# Patient Record
Sex: Female | Born: 1950
Health system: Southern US, Community
[De-identification: ages and names within clinical notes are randomized; demographics above are authoritative.]

## PROBLEM LIST (undated history)

## (undated) DIAGNOSIS — F429 Obsessive-compulsive disorder, unspecified: Secondary | ICD-10-CM

## (undated) DIAGNOSIS — R0602 Shortness of breath: Secondary | ICD-10-CM

## (undated) DIAGNOSIS — B379 Candidiasis, unspecified: Principal | ICD-10-CM

## (undated) DIAGNOSIS — J45909 Unspecified asthma, uncomplicated: Secondary | ICD-10-CM

## (undated) DIAGNOSIS — F419 Anxiety disorder, unspecified: Secondary | ICD-10-CM

## (undated) DIAGNOSIS — F329 Major depressive disorder, single episode, unspecified: Secondary | ICD-10-CM

## (undated) DIAGNOSIS — R05 Cough: Secondary | ICD-10-CM

## (undated) DIAGNOSIS — K219 Gastro-esophageal reflux disease without esophagitis: Secondary | ICD-10-CM

## (undated) DIAGNOSIS — R059 Cough, unspecified: Secondary | ICD-10-CM

## (undated) DIAGNOSIS — I509 Heart failure, unspecified: Secondary | ICD-10-CM

## (undated) DIAGNOSIS — F32A Depression, unspecified: Secondary | ICD-10-CM

## (undated) HISTORY — PX: TUBAL LIGATION: SHX77

## (undated) HISTORY — DX: Cough, unspecified: R05.9

## (undated) HISTORY — DX: Candidiasis, unspecified: B37.9

## (undated) HISTORY — DX: Cough: R05

## (undated) HISTORY — PX: TONSILLECTOMY: SUR1361

## (undated) MED FILL — Azacitidine For Inj 100 MG: INTRAMUSCULAR | Qty: 12.6 | Status: AC

---

## 2000-09-05 ENCOUNTER — Inpatient Hospital Stay (HOSPITAL_COMMUNITY): Admission: EM | Admit: 2000-09-05 | Discharge: 2000-09-09 | Payer: Self-pay | Admitting: *Deleted

## 2003-12-03 ENCOUNTER — Ambulatory Visit (HOSPITAL_COMMUNITY): Admission: RE | Admit: 2003-12-03 | Discharge: 2003-12-03 | Payer: Self-pay | Admitting: Internal Medicine

## 2006-04-16 ENCOUNTER — Emergency Department (HOSPITAL_COMMUNITY): Admission: EM | Admit: 2006-04-16 | Discharge: 2006-04-16 | Payer: Self-pay | Admitting: Emergency Medicine

## 2006-07-10 ENCOUNTER — Emergency Department (HOSPITAL_COMMUNITY): Admission: EM | Admit: 2006-07-10 | Discharge: 2006-07-10 | Payer: Self-pay | Admitting: Emergency Medicine

## 2008-12-10 ENCOUNTER — Ambulatory Visit (HOSPITAL_COMMUNITY): Admission: RE | Admit: 2008-12-10 | Discharge: 2008-12-10 | Payer: Self-pay | Admitting: Internal Medicine

## 2009-08-11 ENCOUNTER — Ambulatory Visit (HOSPITAL_COMMUNITY): Admission: RE | Admit: 2009-08-11 | Discharge: 2009-08-11 | Payer: Self-pay | Admitting: Internal Medicine

## 2010-12-04 NOTE — H&P (Signed)
[facility]  Patient:    [name], [name]                         MRN: [id number] Adm. Date:  [id number] Attending:  Denny Peon Dictator:   Johnella Moloney, N.P.                         History and Physical  IDENTIFYING INFORMATION:  [name] is a 60 year old white married female admitted on an involuntary basis September 05, 2000 with a chief complaint of "Because I cannot control or talk about my obsessive-compulsive thoughts."  HISTORY OF PRESENT ILLNESS:  The patient saw Dr. Dub Mikes on September 05, 2000. Apparently after that she went home and several hours later did not feel calm and she got a knife and cut her left wrist and left posterior chest with a knife, superficial scratches.  She states she just wanted to hurt herself so "They would realize I dont feel good."  By "they" she means her mother, husband, family members, and other people.  She denies this was a suicide attempt.  She reports that her family does not want to talk about her illness. She has been depressed for one month.  Sleep has been so-so but has decreased, appetite so-so but there has been no weight loss.  Currently denies suicidal ideation, denies hallucinations, no paranoia.  She does have decreased concentration, decreased energy, anhedonia, feels very guilty and cries a lot when this is mentioned and then she says, "I just cant get it together."  She has experienced more anxiety lately.  She does have obsessive-compulsive disorder; it has been worse and more intense recently.  The obsessive-compulsive thoughts are of a sexual nature about her aunt and mother.  She is also having other obsessive thoughts of a sexual nature and she states that these thoughts and with her through most of the day.  Again, they have gotten worse over the last two weeks.  She has had obsessive thoughts over the past five years but they have been under control with medications in the past.  Other  stressors include the fact that her father has Alzheimers and her mother is the caregiver.  Also, she has not been able to work because she cannot concentrate and she has been on a leave of absence since October 2001.  Apparently she recently in the past week told her mother and aunt about these obsessions and she wants to talk about it and, of course, they do not.  Compulsions include washing her hands maybe 10 times a day, fearful of germs, checking things, and at times grabbing paper to clean herself related to her sexual obsessions.  PAST PSYCHIATRIC HISTORY:  The patient denies previous suicide attempt.  She has been seeing Dr. Dub Mikes at [facility] for about five years for treatment of her OCD and depression.  She has had one inpatient hospitalization three years ago in [place] for the OCD.  She has also seen a private psychiatrist in [place] for years many years ago.  Apparently the patient has been on Luvox, Anafranil, Prozac, Zoloft, Celexa, and Wellbutrin and Sonata.  Reports there is a little yellow pill that she cannot take but she does not know the name of it.  She feels like the Paxil has been the most beneficial.  PAST MEDICAL HISTORY:  Primary care doctor is Dr. Carylon Perches, whom she last saw last  year.  Medical problems include gastroesophageal reflux disease, a history of multiple bladder infections, tubal ligation 18 years ago, having her menstrual period now.  Medications: Zyprexa 2.5 mg p.o. a.m. and h.s. x 1 day as prescribed by Dr. Dub Mikes yesterday, Paxil 60 mg p.o. q.d.- Dr. Dub Mikes increased that from 40 mg to 60 mg yesterday.  She has been on Paxil for two years.  Nexium 40 mg b.i.d. p.o.  Drug allergies: SULFA, CODEINE, CIPRO. SOCIAL HISTORY:  The patient lives with her husband of 24 years in [place].  She has one son age 22.  Her father is living but suffers from Alzheimers disease.  Her mother is living and is the caretaker for her father.  She  has one sister.  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  She apparently is on a short-term disability now.  She has worked for [employer] for 28 years.  She has been on leave since October 2001.  Her husband works for the [employer], community.  She worries about the money, some financial concerns.  Family history: Father has Alzheimers, paternal cousins have a history of depression.  SUBSTANCE ABUSE HISTORY:  Denies alcohol use or abuse, no illegal substance abuse.  She is a nonsmoker.  REVIEW OF SYSTEMS:  Previous surgeries, major illness, medical diagnoses: She has not had any major surgeries.  Primary care doctor is Dr. Carylon Perches who she saw last year.  Cardiac: She denies any problems; no history of hypertension, heart murmur, angina, arrhythmias.  Pulmonary: Denies any problems; no COPD, no asthma, no recent upper respiratory infection, no cough; she does not smoke.  Neurologic: Denies any problems; no headache, no history of seizures, no history of falling, no dizziness, no confusion, no extremity weakness or numbness.  Hematology: Denies any problems; no anemia or any type of bleeding disorder.  GI: The patient has been diagnosed with gastroesophageal reflux disease and does take Nexium for this.  This was diagnosed at [facility].  GU: Denies any problems; no urinary frequency, urgency, incontinence, hematuria, or nocturia.  She does have a history of multiple bladder infections but not recently.  Musculoskeletal: She denies any problems; no joint pain, no joint swelling.  ENT: The patient wears glasses; otherwise no problems. Reproductive: She currently is having a menstrual period today.  She has a history of a tubal ligation.  Preventative care: She has had Pap smear, mammogram, and visits her dentist, the last time was six weeks ago. Skin/mucosa: She does have superficial scratches on her left wrist as well as her left posterior thorax.   Pain: She denies any pain.  Sleep: She has some problems sleeping.  She was started on Sonata one to two months ago but is not taking it now.  Nutrition: She states she eats fairly well.  There has been no  weight loss or weight gain.  PHYSICAL EXAMINATION:  Vital signs: Temperature 96.9, pulse 78, respirations 18, blood pressure 116/77, height 5 feet 3 inches, weight 133 pounds. Superficial scratches on her left wrist and left posterior thorax.  General: A 60 year old female sitting on the exam table, no acute distress. Well-developed and average in stature and appears her stated age.  Head: Normocephalic, atraumatic, can raise eyebrows.  Eyes: PERRLA.  EOM intact bilaterally, directing consensual.  Funduscopic exam: Within normal limits. ENT/mouth: External ear canals patent.  Tympanic membranes: Intact with normal kind of light.  Nostrils set bilaterally, no tenderness.  Mouth: Mucosa is moist, good  dentition, no lesions seen or palpated on tongue.  Tongue protrudes to midline without tremor.  Can clench teeth and puff out cheeks. Uvula is midline, no pharyngeal hyperemia or exudate noted.  Neck: Supple with full range of motion, no JVD, no lymphadenopathy.  Thyroid is nonpalpable, nontender, not enlarged.  Trachea: Midline.  Respiratory: Clear to auscultation without adventitious sounds, no cough.  Cardiovascular: Regular rate and rhythm without murmurs.  Carotid pulses equal and adequate bilaterally, no carotid bruits auscultated.  Pedal pulses equal and adequate, no edema or varicosities noted.  Chest: Deferred.  Abdomen: Flat, soft, nontender abdomen; no masses, organomegaly, or rebound tenderness.  Active bowel sounds all four quadrants, no CVA tenderness.  Lymph: No lymphadenopathy.  Muscular: No joint swelling or deformity.  Gait is normal. Good range of motion in her hands, wrists, elbows, shoulders, spine, hips, knees, ankles, feet.  Muscle strength and tone are equal  bilaterally, no signs of injury.  Skin is warm and dry, good turgor.  Nail beds pink with good capillary refill, no clubbing.  Superficial scratches on her left wrist as well as on her left posterior thorax.  Neurologic: Oriented x 3.  Cranial nerves grossly intact.  Deep tendon reflexes are 2+, equal, and adequate upper and lower extremities.  Good grip strength bilaterally with no involuntary movement.  Gait is normal.  Cerebellar function: Intact with finger-to-finger, heel-to-shin, normal alternating movements, normal gait.  Romberg is negative.  LABORATORY DATA:  CBC: Within normal limits with the exception of RDW slightly elevated at 15.1.  CMET: Within normal limits with the exception of bilirubin slightly elevated at 1.7.  CURRENT MENTAL STATUS EXAMINATION:  Middle aged, adult Caucasian female who is casually dressed.  Cooperative and pleasant, good eye contact.  Speech: Low volume but normal and relevant.  Mood: Tearful, sad.  Affect: Depressed.  She did superficially cut herself yesterday; however she denies current suicidal ideation or intent, denies current homicidal ideation or intent.  Thought processes: Logical, no evidence of psychosis, no hallucinations, no paranoia. Obsessive-compulsive urges with racing thoughts; she also does act out on some of the compulsive thoughts such as checking, washing hands, etc.  Cognitive: Alert and oriented, cognitive functioning is intact.  Average intelligence. Judgment: Poor.  Insight: Fair.  Impulse control: Poor.  CURRENT DIAGNOSES: Axis I:    1. Major depression, recurrent with a suicidal gesture.            2. Obsessive-compulsive disorder. Axis II:   Deferred. Axis III:  Gastroesophageal reflux disease. Axis IV:   Severe; problems with primary support group, problems related to            social environment, and occupational problems. Axis V:    Current global assessment of functioning 35, highest in the past            year  70.  TREATMENT PLAN AND RECOMMENDATIONS:  Voluntary admission to [facility].  Check every 15 minutes to maintain safety; the patient is able to contract for safety.  Our goal will be to reduce the signs and symptoms of her obsessive thoughts and also her obsessive compulsions. She was started on Zyprexa yesterday by Dr. Dub Mikes and we will continue Zyprexa 2.5 mg a.m. and h.s. p.o., Paxil 60 mg p.o. at h.s. (Dr. Dub Mikes had her on Paxil 40 mg, increased it to 60 mg yesterday), Nexium 40 mg b.i.d. p.o. for her gastroesophageal reflux disease.  The patient will attend groups.  TENTATIVE LENGTH OF  STAY:  Three days. DD:  09/06/00 TD:  09/06/00 Job: [id number]
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
## 2011-02-22 ENCOUNTER — Emergency Department (HOSPITAL_COMMUNITY): Payer: No Typology Code available for payment source

## 2011-02-22 ENCOUNTER — Encounter: Payer: Self-pay | Admitting: *Deleted

## 2011-02-22 ENCOUNTER — Emergency Department (HOSPITAL_COMMUNITY)
Admission: EM | Admit: 2011-02-22 | Discharge: 2011-02-22 | Disposition: A | Discharge status: Home Health | Payer: No Typology Code available for payment source | Attending: Emergency Medicine | Admitting: Emergency Medicine

## 2011-02-22 DIAGNOSIS — Y92410 Unspecified street and highway as the place of occurrence of the external cause: Secondary | ICD-10-CM | POA: Insufficient documentation

## 2011-02-22 DIAGNOSIS — M549 Dorsalgia, unspecified: Secondary | ICD-10-CM | POA: Insufficient documentation

## 2011-02-22 DIAGNOSIS — G8929 Other chronic pain: Secondary | ICD-10-CM
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
## 2011-03-24 ENCOUNTER — Other Ambulatory Visit (HOSPITAL_COMMUNITY)
Admission: RE | Admit: 2011-03-24 | Discharge: 2011-03-24 | Disposition: A | Discharge status: Home / Self Care | Payer: Medicare PPO | Source: Ambulatory Visit | Attending: Obstetrics and Gynecology | Admitting: Obstetrics and Gynecology

## 2011-03-24 ENCOUNTER — Other Ambulatory Visit: Payer: Self-pay | Admitting: Obstetrics and Gynecology

## 2011-03-24 DIAGNOSIS — Z01419 Encounter for gynecological examination (general) (routine) without abnormal findings: Secondary | ICD-10-CM | POA: Insufficient documentation

## 2011-05-11 ENCOUNTER — Other Ambulatory Visit (HOSPITAL_COMMUNITY): Payer: Self-pay | Admitting: Internal Medicine

## 2011-05-11 ENCOUNTER — Ambulatory Visit (HOSPITAL_COMMUNITY)
Admission: RE | Admit: 2011-05-11 | Discharge: 2011-05-11 | Disposition: A | Discharge status: Home / Self Care | Payer: No Typology Code available for payment source | Source: Ambulatory Visit | Attending: Internal Medicine | Admitting: Internal Medicine

## 2011-05-11 DIAGNOSIS — M545 Low back pain, unspecified: Secondary | ICD-10-CM | POA: Insufficient documentation

## 2011-05-11 DIAGNOSIS — R52 Pain, unspecified: Secondary | ICD-10-CM

## 2011-06-17 ENCOUNTER — Ambulatory Visit (HOSPITAL_COMMUNITY)
Admission: RE | Admit: 2011-06-17 | Discharge: 2011-06-17 | Disposition: A | Discharge status: Home / Self Care | Payer: No Typology Code available for payment source | Source: Ambulatory Visit | Attending: Internal Medicine | Admitting: Internal Medicine

## 2011-06-17 DIAGNOSIS — M545 Low back pain, unspecified: Secondary | ICD-10-CM | POA: Insufficient documentation

## 2011-06-17 DIAGNOSIS — M25579 Pain in unspecified ankle and joints of unspecified foot: Secondary | ICD-10-CM | POA: Insufficient documentation

## 2011-06-17 DIAGNOSIS — IMO0001 Reserved for inherently not codable concepts without codable children: Secondary | ICD-10-CM | POA: Insufficient documentation

## 2011-06-17 DIAGNOSIS — M6281 Muscle weakness (generalized): Secondary | ICD-10-CM | POA: Insufficient documentation

## 2011-06-17 DIAGNOSIS — R262 Difficulty in walking, not elsewhere classified: Secondary | ICD-10-CM | POA: Insufficient documentation
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
## 2011-06-22 ENCOUNTER — Ambulatory Visit (HOSPITAL_COMMUNITY)
Admission: RE | Admit: 2011-06-22 | Discharge: 2011-06-22 | Disposition: A | Discharge status: Home / Self Care | Payer: No Typology Code available for payment source | Source: Ambulatory Visit | Attending: Internal Medicine | Admitting: Internal Medicine

## 2011-06-22 DIAGNOSIS — M545 Low back pain, unspecified: Secondary | ICD-10-CM | POA: Insufficient documentation

## 2011-06-22 DIAGNOSIS — R262 Difficulty in walking, not elsewhere classified: Secondary | ICD-10-CM | POA: Insufficient documentation

## 2011-06-22 DIAGNOSIS — M6281 Muscle weakness (generalized): Secondary | ICD-10-CM | POA: Insufficient documentation

## 2011-06-22 DIAGNOSIS — M25579 Pain in unspecified ankle and joints of unspecified foot: Secondary | ICD-10-CM | POA: Insufficient documentation

## 2011-06-22 DIAGNOSIS — IMO0001 Reserved for inherently not codable concepts without codable children: Secondary | ICD-10-CM | POA: Insufficient documentation

## 2011-06-22 NOTE — Progress Notes (Signed)
Physical Therapy Treatment Patient Details  Name: [name] MRN: [id number] Date of Birth: 1950/11/15  Today's Date: 06/22/2011 Time: 0865-7846 Time Calculation (min): 63 min Visit#: 2  of 8   Re-eval: 07/17/11  Charge: therex 38 min Manual 10 min IFES 15 min  Subjective: Symptoms/Limitations Symptoms: Pt reports chronic back pain rated 4/10, pt also reports spraining L ankle on black Friday shopping, pain scale 5/10. Pain Assessment Currently in Pain?: Yes Pain Score:   4 Pain Location: Back Pain Orientation: Lower Multiple Pain Sites: Yes  Objective:   Exercise/Treatments Stretches Prone on Elbows Stretch: Limitations Prone on Elbows Stretch Limitations: 2 min Piriformis Stretch: Limitations;60 seconds;2 reps Piriformis Stretch Limitations: BLE Stability Bridge: 10 reps Ab Set: 5 reps;5 seconds Heel Squeeze: Prone;5 reps;5 seconds Leg Raise: Prone;10 reps;3 seconds Functional Squats: 15 reps;3 seconds Heel Raises: 15 reps;Limitations Heel Raises Limitations: Toe raises 15 reps  Modalities Modalities: Electrical Stimulation Manual Therapy Manual Therapy: Myofascial release Myofascial Release: STM/MFR low backx 8' Pharmacologist Location: low back Electrical Stimulation Action: reduce pain Electrical Stimulation Parameters: hi/lo sweep 13 volts Electrical Stimulation Goals: Pain  Physical Therapy Assessment and Plan PT Assessment and Plan Clinical Impression Statement: Pt required multimodal cueing with isometric abdominal sets, diaphragmatic breathing and vc-ing to bring ribs close to hips most effective.  Able to reduce spasms R quadratus lumborum with pain relief stated following. PT Plan: Continue with core stability/strengthening, assess pain relief following todays session.    Goals Home Exercise Program Pt will Perform Home Exercise Program: Independently PT Short Term Goals Time to Complete Short Term Goals: 2  weeks PT Short Term Goal 1: Pt will report pain less than 3/10 for 50% of her day.  PT Short Term Goal 2: Pt will improve her core strength by 1 muscle grade.  PT Short Term Goal 3: Pt will present with minimal spasms to her gluteal and hip region.  PT Long Term Goals Time to Complete Long Term Goals: 4 weeks PT Long Term Goal 1: Pt will improve her core strength to [facility] be able to to stand for greater than 90 minutes to complete housework activities.  PT Long Term Goal 2: Pt will report pain less than 2/10 for 75% of her day for improved quality of life.  Long Term Goal 3: Pt will improve her LE flexibility in order to ambulate with appropriate mechanics in order to decrease risk of secondary impairments.   Problem List Patient Active Problem List  Diagnoses  . Low back pain    PT - End of Session Activity Tolerance: Patient tolerated treatment well General Behavior During Session: [facility] for tasks performed Cognition: [facility] for tasks performed  Juel Burrow 06/22/2011, 9:53 AM

## 2011-06-29 ENCOUNTER — Ambulatory Visit (HOSPITAL_COMMUNITY): Payer: No Typology Code available for payment source | Admitting: Physical Therapy

## 2011-06-29 ENCOUNTER — Ambulatory Visit (HOSPITAL_COMMUNITY)
Admission: RE | Admit: 2011-06-29 | Discharge: 2011-06-29 | Disposition: A | Discharge status: Home / Self Care | Payer: No Typology Code available for payment source | Source: Ambulatory Visit | Attending: Internal Medicine | Admitting: Internal Medicine

## 2011-06-29 NOTE — Progress Notes (Signed)
Physical Therapy Treatment Patient Details  Name: Minie LOREAL SCHUESSLER MRN: [id number] Date of Birth: 30-Oct-1950  Today's Date: 06/29/2011 Time: 7829-5621 Time Calculation (min): 63 min Charges: 35' TE, 10' Gt, 1 estim Visit#: 3  of 8   Re-eval: 07/17/11    Subjective: Symptoms/Limitations Symptoms: Pt reports that she was able to go walking with her family member yesterday for about a mile without much pain to her back or ankle. She still reports some soreness to her R ankle.  Pain Assessment Currently in Pain?: Yes Pain Score:   3 Pain Location: Back  Precautions/Restrictions     Mobility (including Balance)       Exercise/Treatments Lumbar Exercises Scapular Retraction: Both;15 reps;Theraband Theraband Level (Scapular Retraction): Level 3 (Green) Row: Both;15 reps;Theraband Theraband Level (Row): Level 3 (Green) Shoulder Extension: Both;15 reps;Theraband Theraband Level (Shoulder Extension): Level 3 (Green) Stability Bridge: 20 reps Bent Knee Raise: 15 reps;Other (comment) (3x5 BLE) Ab Set: 20 reps;5 seconds;Limitations AB Set Limitations: max instruction for proper technqiue using verbal and manual cueing Functional Squats: 20 reps Heel Raises: 20 reps Heel Raises Limitations: Toe raises 20 reps Rocker Board 2 minutes Tandem Gait 2 RT w/min A Machine Exercises Tread Mill: 2' 1.8, 5' 1.6 instruction for posture, technique and gait mechanics  Electrical Stimulation Electrical Stimulation Location: L gluteal reigion  Electrical Stimulation Action: IFES x15 min Electrical Stimulation Goals: Pain  Physical Therapy Assessment and Plan PT Assessment and Plan Clinical Impression Statement: Pt demonstrates difficulty with balance which improved after activities today and cueing for awareness and technique. Cont to require cueing for appropriate core contraction. PT Plan: Cont with core training, balance and pain relief    Goals    Problem List Patient Active Problem  List  Diagnoses  . Low back pain    PT - End of Session Activity Tolerance: Patient tolerated treatment well  Sheliah Fiorillo 06/29/2011, 1:52 PM

## 2011-07-01 ENCOUNTER — Ambulatory Visit (HOSPITAL_COMMUNITY)
Admission: RE | Admit: 2011-07-01 | Discharge: 2011-07-01 | Disposition: A | Discharge status: Home / Self Care | Payer: No Typology Code available for payment source | Source: Ambulatory Visit | Attending: Internal Medicine | Admitting: Internal Medicine

## 2011-07-01 ENCOUNTER — Ambulatory Visit (HOSPITAL_COMMUNITY): Payer: No Typology Code available for payment source | Admitting: Physical Therapy

## 2011-07-01 NOTE — Progress Notes (Signed)
Physical Therapy Treatment Patient Details  Name: [name] MRN: [id number] Date of Birth: 22-Jun-1951  Today's Date: 07/01/2011 Time: 4098-1191 Time Calculation (min): 55 min Visit#: 4  of 8   Re-eval: 07/16/11 Charges:  therex 38', estim unattended 15'    Subjective: Symptoms/Limitations Symptoms: Pt. states she went shopping all day yesterday without increased symptoms. Pain Assessment Currently in Pain?: Yes Pain Score:   2 Pain Location: Back Pain Orientation: Lower Pain Radiating Towards: Left   Exercise/Treatments Stretches Prone on Elbows Stretch: Limitations Prone on Elbows Stretch Limitations: 2 min Lumbar Exercises Scapular Retraction: Both;15 reps;Theraband Theraband Level (Scapular Retraction): Level 3 (Green) Row: Both;15 reps;Theraband Theraband Level (Row): Level 3 (Green) Shoulder Extension: Both;15 reps;Theraband Theraband Level (Shoulder Extension): Level 3 (Green) Stability Bridge: 20 reps Bent Knee Raise: 15 reps Large Ball Abdominal Isometric: 10 reps;5 reps Heel Squeeze: Prone;5 reps Single Arm Raise: Prone;10 reps Leg Raise: Prone;10 reps Functional Squats: 20 reps Heel Raises: 20 reps Heel Raises Limitations: Toe raises 20 reps Machine Exercises Tread Mill: 6'@1 .  Modalities Modalities: Copywriter, advertising Location: B gluteal region/ central LB Electrical Stimulation Action: IFES hi/lo sweep intensity 19 volts X 15 minutes while lying in prone with 1 pillow under hips Electrical Stimulation Goals: Pain  Physical Therapy Assessment and Plan PT Assessment and Plan Clinical Impression Statement: Added ball abdominal strengthening exercises without difficulty.  Pt required manual/verbal cues for prone alternating arms/legs due to difficulty coordinating movement. PT Plan: Continue to progress per POC.     Problem List Patient Active Problem List  Diagnoses  . Low back pain     PT - End of Session Activity Tolerance: Patient tolerated treatment well General Behavior During Session: [facility] for tasks performed Cognition: [facility] for tasks performed  Lurena Nida 07/01/2011, 3:44 PM

## 2011-07-06 ENCOUNTER — Ambulatory Visit (HOSPITAL_COMMUNITY)
Admission: RE | Admit: 2011-07-06 | Discharge: 2011-07-06 | Disposition: A | Discharge status: Home / Self Care | Payer: No Typology Code available for payment source | Source: Ambulatory Visit | Attending: Internal Medicine | Admitting: Internal Medicine

## 2011-07-06 NOTE — Progress Notes (Signed)
Physical Therapy Evaluation  Patient Details  Name: [name] MRN: [id number] Date of Birth: 18-Mar-1951  Today's Date: 07/06/2011 Time: 1478-2956 Time Calculation (min): 61 min Charges: 1 MMT,  1 e-stim, 25' TE, 15' Self Care Visit#: 5  of 8   Re-eval: 08/05/11    Past Medical History: No past medical history on file. Past Surgical History:  Past Surgical History  Procedure Date  . Tubal ligation   . Tonsillectomy     Subjective Symptoms/Limitations Symptoms: Pt reports that she feels that she has made some good improvement with therapy.  She is able to complete about 30 minutes of housework before getting tired and needing a rest.  Pain Assessment Currently in Pain?: Yes Pain Score:   1 Pain Location: Back  RLE Strength Right Hip Extension: 4/5; ABduction: 5/5; ADduction: 5/5 LLE Strength Left Hip Extension: 4/5; ABduction: 5/5; ADduction: 4/5; Flexion: 5/5 Lumbar Strength Lumbar Flexion: 4/5; Extension: 5/5  07/06/11 1000  Palpation:  Minimal spasm to gluteal minumus.  Without spasm to her ITB.   Posture/Postural Control  Postural Limitations Able to maintain appropriate cervical posture during entire therapy regime.      Exercise/Treatments Stretches Lobbyist: 3 reps;30 seconds Lumbar Exercises Scapular Retraction: Both;Theraband;Limitations Theraband Level (Scapular Retraction): Level 3 (Green) Scapular Retraction Limitations: 20x Row: Both;Theraband;Limitations Theraband Level (Row): Level 3 (Green) Row Limitations: 20x Shoulder Extension: Both;Theraband;20 reps Theraband Level (Shoulder Extension): Level 3 (Green) Shoulder ADduction: Both;20 reps;Theraband Theraband Level (Shoulder Adduction): Level 3 (Green) Stability Clam: Side-lying;20 reps;Limitations Clam Limitations: BLE Bent Knee Raise: 15 reps;Limitations Bent Knee Raise Limitations: BLE Single Arm Raise: Right;Left;10 reps Opposite Arm/Leg Raise: Prone;Right arm/Left leg;Left  arm/Right leg;5 reps Lifting: From floor;5 reps;Weights Lifting Weights (lbs): 8# box; Lifting with golf lift x5 each leg Stretches Quad Stretch: 3 reps;30 seconds  Modalities Modalities: Copywriter, advertising Location: B gluteal region/ central LB  Electrical Stimulation Action: IFES hi/lo sweep intensity 19 volts X 15 minutes while lying in prone Electrical Stimulation Goals: Pain  Physical Therapy Assessment and Plan PT Assessment and Plan Clinical Impairments Affecting Rehab Potential: Progress Note complete.  Ms. Glauser has attended 6 OPPT visits and has met all of her STG and 2/3 of her LTG.  She has made significant gains with her LE and core strength, a decreased in spasms to her R gluteal region, and adherence with HEP.  She continues to have a decrease in overall core endurance, and decreased LE flexibility which are still limiting her overall quality of life.  She will continue to benefit from skilled OPPT for 2 more visits to address the above impairments to meet her remaining LTG.   PT Frequency: Min 2X/week PT Duration: Other (comment) (1 week) PT Plan: Progress Note/DC note complete and sent to MD.  COntinue with 2 more visits to address above goal with postural endurance and impaired flexibility.     Goals Home Exercise Program Pt will Perform Home Exercise Program: Independently PT Goal: Perform Home Exercise Program - Progress: Met PT Short Term Goals Time to Complete Short Term Goals: 2 weeks PT Short Term Goal 1: Pt will report pain less than 3/10 for 50% of her day.  PT Short Term Goal 1 - Progress: Met PT Short Term Goal 2: Pt will improve her core strength by 1 muscle grade.  PT Short Term Goal 2 - Progress: Met PT Short Term Goal 3: Pt will present with minimal spasms to her gluteal and hip region.  PT  Short Term Goal 3 - Progress: Met PT Long Term Goals Time to Complete Long Term Goals: 4 weeks PT Long Term Goal  1: Pt will improve her core strength to [facility] be able to to stand for greater than 90 minutes to complete housework activities.  PT Long Term Goal 1 - Progress: Progressing toward goal PT Long Term Goal 2: Pt will report pain less than 2/10 for 75% of her day for improved quality of life.  PT Long Term Goal 2 - Progress: Met Long Term Goal 3: Pt will improve her LE flexibility in order to ambulate with appropriate mechanics in order to decrease risk of secondary impairments.  Long Term Goal 3 Progress: Met  Problem List Patient Active Problem List  Diagnoses  . Low back pain     [name] 07/06/2011, 11:23 AM  Physician Documentation Your signature is required to indicate approval of the treatment plan as stated above.  Please sign and either send electronically or make a copy of this report for your files and return this physician signed original.   Please mark one 1.__approve of plan  2. ___approve of plan with the following conditions.   ______________________________                                                          _____________________ Physician Signature                                                                                                             Date

## 2011-07-08 ENCOUNTER — Telehealth (HOSPITAL_COMMUNITY): Payer: Self-pay

## 2011-07-08 ENCOUNTER — Ambulatory Visit (HOSPITAL_COMMUNITY): Payer: No Typology Code available for payment source

## 2011-07-14 ENCOUNTER — Ambulatory Visit (HOSPITAL_COMMUNITY)
Admission: RE | Admit: 2011-07-14 | Discharge: 2011-07-14 | Disposition: A | Discharge status: Home / Self Care | Payer: No Typology Code available for payment source | Source: Ambulatory Visit | Attending: Internal Medicine | Admitting: Internal Medicine

## 2011-07-14 NOTE — Progress Notes (Signed)
Physical Therapy Treatment Patient Details  Name: [name] MRN: [id number] Date of Birth: 1950/10/28  Today's Date: 07/14/2011 Time: 2130-8657 Pt started by Emeline Gins, PTA Time Calculation (min): 42 min Visit#: 6  of 8   Re-eval: 08/05/11 Charges: Therex x 32' Gait x 8'  Subjective: Symptoms/Limitations Symptoms: Pt. reports she doesnt have any pain; even cooked alot over Christmas without much pain.  "Doing great!" Pain Assessment Currently in Pain?: No/denies  Exercise/Treatments SLumbar Exercises Scapular Retraction: Both;Theraband;Limitations Theraband Level (Scapular Retraction): Level 3 (Green) Scapular Retraction Limitations: 20x Row: Both;Theraband;Limitations Theraband Level (Row): Level 3 (Green) Row Limitations: 20x Shoulder Extension: Both;Theraband;20 reps Theraband Level (Shoulder Extension): Level 3 (Green) Stability Clam: Side-lying;20 reps;Limitations Clam Limitations: BLE Bent Knee Raise: 20 reps Bent Knee Raise Limitations: BLE Straight Leg Raise: Limitations Straight Leg Raises Limitations: SLS R 10", L 8" Opposite Arm/Leg Raise: Prone;Right arm/Left leg;Left arm/Right leg;5 reps Functional Squats: 20 reps;5 seconds Heel Raises: 20 reps;Limitations Heel Raises Limitations: Toe raises 20 reps Machine Exercises Tread Mill: 8'@1 .   Physical Therapy Assessment and Plan PT Assessment and Plan Clinical Impression Statement: Pt requires multimodal cueing to correctly complete prone opposite arm/raise. Pt requires demo and VC's for proper form with tband exercises. HEP given. IFC not completed secondary to no pain. PT reprots that she is comfortable with D/C today. PT Plan: D/C per last PT re-eval and per pt.    Goals Home Exercise Program Pt will Perform Home Exercise Program: Independently PT Short Term Goals Time to Complete Short Term Goals: 2 weeks PT Short Term Goal 1: Pt will report pain less than 3/10 for 50% of her day.  PT Short  Term Goal 1 - Progress: Met PT Short Term Goal 2: Pt will improve her core strength by 1 muscle grade.  PT Short Term Goal 2 - Progress: Met PT Short Term Goal 3: Pt will present with minimal spasms to her gluteal and hip region.  PT Short Term Goal 3 - Progress: Met PT Long Term Goals Time to Complete Long Term Goals: 4 weeks PT Long Term Goal 1: Pt will improve her core strength to [facility] be able to to stand for greater than 90 minutes to complete housework activities.  PT Long Term Goal 1 - Progress: Partly met (Pt core strength is WNL but she has not tried standing 90') PT Long Term Goal 2: Pt will report pain less than 2/10 for 75% of her day for improved quality of life.  PT Long Term Goal 2 - Progress: Met Long Term Goal 3: Pt will improve her LE flexibility in order to ambulate with appropriate mechanics in order to decrease risk of secondary impairments.  Long Term Goal 3 Progress: Met  Problem List Patient Active Problem List  Diagnoses  . Low back pain    PT - End of Session Activity Tolerance: Patient tolerated treatment well General Behavior During Session: [facility] for tasks performed Cognition: [facility] for tasks performed  Antonieta Iba 07/14/2011, 9:31 AM

## 2011-07-15 ENCOUNTER — Ambulatory Visit (HOSPITAL_COMMUNITY): Payer: No Typology Code available for payment source | Admitting: Physical Therapy

## 2012-03-15 ENCOUNTER — Encounter (HOSPITAL_BASED_OUTPATIENT_CLINIC_OR_DEPARTMENT_OTHER): Payer: Self-pay | Admitting: *Deleted

## 2012-03-15 NOTE — Progress Notes (Signed)
Records from patient's November, 2012 hospitalization requested from [facility]. To fax info.

## 2012-03-21 ENCOUNTER — Ambulatory Visit (HOSPITAL_BASED_OUTPATIENT_CLINIC_OR_DEPARTMENT_OTHER): Admission: RE | Admit: 2012-03-21 | Payer: Medicare Other | Source: Ambulatory Visit | Admitting: Orthopedic Surgery

## 2012-03-21 ENCOUNTER — Encounter (HOSPITAL_BASED_OUTPATIENT_CLINIC_OR_DEPARTMENT_OTHER): Admission: RE | Payer: Self-pay | Source: Ambulatory Visit

## 2012-03-21 ENCOUNTER — Inpatient Hospital Stay (HOSPITAL_BASED_OUTPATIENT_CLINIC_OR_DEPARTMENT_OTHER): Admission: RE | Admit: 2012-03-21 | Payer: Medicare Other | Source: Ambulatory Visit

## 2012-03-21 ENCOUNTER — Other Ambulatory Visit: Payer: Self-pay | Admitting: Orthopedic Surgery

## 2012-03-21 ENCOUNTER — Encounter (HOSPITAL_BASED_OUTPATIENT_CLINIC_OR_DEPARTMENT_OTHER)
Admission: RE | Admit: 2012-03-21 | Discharge: 2012-03-21 | Disposition: A | Discharge status: Home / Self Care | Payer: Medicare Other | Source: Ambulatory Visit | Attending: Orthopedic Surgery | Admitting: Orthopedic Surgery

## 2012-03-21 HISTORY — DX: Major depressive disorder, single episode, unspecified: F32.9

## 2012-03-21 HISTORY — DX: Unspecified asthma, uncomplicated: J45.909

## 2012-03-21 HISTORY — DX: Shortness of breath: R06.02

## 2012-03-21 HISTORY — DX: Heart failure, unspecified: I50.9

## 2012-03-21 HISTORY — DX: Gastro-esophageal reflux disease without esophagitis: K21.9

## 2012-03-21 HISTORY — DX: Depression, unspecified: F32.A

## 2012-03-21 HISTORY — DX: Anxiety disorder, unspecified: F41.9

## 2012-03-21 LAB — BASIC METABOLIC PANEL
GFR calc Af Amer: >90 mL/min (ref >90)
Glucose, Bld: 88 mg/dL (ref 70–99)
Potassium: 4.4 mEq/L (ref 3.5–5.1)

## 2012-03-21 SURGERY — CARPAL TUNNEL RELEASE
Anesthesia: Regional | Laterality: Right

## 2012-03-21 NOTE — Progress Notes (Signed)
Surgery r/s from 9/3 to 9/4 Here for lab

## 2012-03-22 ENCOUNTER — Encounter (HOSPITAL_BASED_OUTPATIENT_CLINIC_OR_DEPARTMENT_OTHER): Payer: Self-pay | Admitting: Anesthesiology

## 2012-03-22 ENCOUNTER — Encounter (HOSPITAL_BASED_OUTPATIENT_CLINIC_OR_DEPARTMENT_OTHER): Payer: Self-pay | Admitting: Orthopedic Surgery

## 2012-03-22 ENCOUNTER — Ambulatory Visit (HOSPITAL_BASED_OUTPATIENT_CLINIC_OR_DEPARTMENT_OTHER): Payer: Medicare Other | Admitting: Anesthesiology

## 2012-03-22 ENCOUNTER — Encounter (HOSPITAL_BASED_OUTPATIENT_CLINIC_OR_DEPARTMENT_OTHER): Payer: Self-pay | Admitting: *Deleted

## 2012-03-22 ENCOUNTER — Ambulatory Visit (HOSPITAL_BASED_OUTPATIENT_CLINIC_OR_DEPARTMENT_OTHER)
Admission: RE | Admit: 2012-03-22 | Discharge: 2012-03-22 | Disposition: A | Discharge status: Home / Self Care | Payer: Medicare Other | Source: Ambulatory Visit | Attending: Orthopedic Surgery | Admitting: Orthopedic Surgery

## 2012-03-22 ENCOUNTER — Encounter (HOSPITAL_BASED_OUTPATIENT_CLINIC_OR_DEPARTMENT_OTHER)
Admission: RE | Disposition: A | Discharge status: Home / Self Care | Payer: Self-pay | Source: Ambulatory Visit | Attending: Orthopedic Surgery

## 2012-03-22 DIAGNOSIS — K219 Gastro-esophageal reflux disease without esophagitis: Secondary | ICD-10-CM | POA: Insufficient documentation

## 2012-03-22 DIAGNOSIS — F329 Major depressive disorder, single episode, unspecified: Secondary | ICD-10-CM | POA: Insufficient documentation

## 2012-03-22 DIAGNOSIS — G5600 Carpal tunnel syndrome, unspecified upper limb: Secondary | ICD-10-CM | POA: Insufficient documentation

## 2012-03-22 DIAGNOSIS — F3289 Other specified depressive episodes: Secondary | ICD-10-CM | POA: Insufficient documentation

## 2012-03-22 DIAGNOSIS — I509 Heart failure, unspecified: Secondary | ICD-10-CM | POA: Insufficient documentation

## 2012-03-22 DIAGNOSIS — F411 Generalized anxiety disorder: Secondary | ICD-10-CM | POA: Insufficient documentation

## 2012-03-22 HISTORY — PX: CARPAL TUNNEL RELEASE: SHX101

## 2012-03-22 SURGERY — CARPAL TUNNEL RELEASE
Anesthesia: Regional | Site: Wrist | Laterality: Right | Wound class: Clean

## 2012-03-22 MED ORDER — LACTATED RINGERS IV SOLN
INTRAVENOUS | Status: DC
  Administered 2012-03-22 (×2): via INTRAVENOUS

## 2012-03-22 MED ORDER — FENTANYL CITRATE 0.05 MG/ML IJ SOLN: 25.0000 ug | INTRAMUSCULAR | Status: DC | PRN

## 2012-03-22 MED ORDER — ONDANSETRON HCL 4 MG/2ML IJ SOLN
INTRAMUSCULAR | Status: DC | PRN
  Administered 2012-03-22: 4 mg via INTRAVENOUS

## 2012-03-22 MED ORDER — MIDAZOLAM HCL 5 MG/5ML IJ SOLN
INTRAMUSCULAR | Status: DC | PRN
  Administered 2012-03-22: 1 mg via INTRAVENOUS

## 2012-03-22 MED ORDER — LIDOCAINE HCL (CARDIAC) 20 MG/ML IV SOLN
INTRAVENOUS | Status: DC | PRN
  Administered 2012-03-22: 30 mg via INTRAVENOUS

## 2012-03-22 MED ORDER — PENTAZOCINE-NALOXONE 50-0.5 MG PO TABS: 1.0000 | ORAL_TABLET | ORAL | Status: AC | PRN

## 2012-03-22 MED ORDER — METOCLOPRAMIDE HCL 5 MG/ML IJ SOLN: 10.0000 mg | Freq: Once | INTRAMUSCULAR | Status: DC | PRN

## 2012-03-22 MED ORDER — CEFAZOLIN SODIUM-DEXTROSE 2-3 GM-% IV SOLR
2.0000 g | INTRAVENOUS | Status: AC
  Administered 2012-03-22: 2 g via INTRAVENOUS

## 2012-03-22 MED ORDER — OXYCODONE HCL 5 MG/5ML PO SOLN: 5.0000 mg | Freq: Once | ORAL | Status: DC | PRN

## 2012-03-22 MED ORDER — PROPOFOL 10 MG/ML IV EMUL
INTRAVENOUS | Status: DC | PRN
  Administered 2012-03-22: 50 ug/kg/min via INTRAVENOUS

## 2012-03-22 MED ORDER — BUPIVACAINE HCL (PF) 0.25 % IJ SOLN
INTRAMUSCULAR | Status: DC | PRN
  Administered 2012-03-22: 8 mL

## 2012-03-22 MED ORDER — CHLORHEXIDINE GLUCONATE 4 % EX LIQD: 60.0000 mL | Freq: Once | CUTANEOUS | Status: DC

## 2012-03-22 MED ORDER — OXYCODONE HCL 5 MG PO TABS: 5.0000 mg | ORAL_TABLET | Freq: Once | ORAL | Status: DC | PRN

## 2012-03-22 SURGICAL SUPPLY — 35 items
BANDAGE GAUZE ELAST BULKY 4 IN (GAUZE/BANDAGES/DRESSINGS) ×2 IMPLANT
BLADE SURG 15 STRL LF DISP TIS (BLADE) ×1 IMPLANT
BLADE SURG 15 STRL SS (BLADE) ×2
BNDG CMPR 9X4 STRL LF SNTH (GAUZE/BANDAGES/DRESSINGS)
BNDG COHESIVE 3X5 TAN STRL LF (GAUZE/BANDAGES/DRESSINGS) ×2 IMPLANT
BNDG ESMARK 4X9 LF (GAUZE/BANDAGES/DRESSINGS) IMPLANT
CHLORAPREP W/TINT 26ML (MISCELLANEOUS) ×2 IMPLANT
CLOTH BEACON ORANGE TIMEOUT ST (SAFETY) ×2 IMPLANT
CORDS BIPOLAR (ELECTRODE) ×2 IMPLANT
COVER MAYO STAND STRL (DRAPES) ×2 IMPLANT
COVER TABLE BACK 60X90 (DRAPES) ×2 IMPLANT
CUFF TOURNIQUET SINGLE 18IN (TOURNIQUET CUFF) ×2 IMPLANT
DRAPE EXTREMITY T 121X128X90 (DRAPE) ×2 IMPLANT
DRAPE SURG 17X23 STRL (DRAPES) ×2 IMPLANT
DRSG KUZMA FLUFF (GAUZE/BANDAGES/DRESSINGS) ×2 IMPLANT
GAUZE XEROFORM 1X8 LF (GAUZE/BANDAGES/DRESSINGS) ×2 IMPLANT
GLOVE BIO SURGEON STRL SZ 6.5 (GLOVE) ×2 IMPLANT
GLOVE SURG ORTHO 8.0 STRL STRW (GLOVE) ×2 IMPLANT
GOWN BRE IMP PREV XXLGXLNG (GOWN DISPOSABLE) ×2 IMPLANT
GOWN PREVENTION PLUS XLARGE (GOWN DISPOSABLE) ×2 IMPLANT
NEEDLE 27GAX1X1/2 (NEEDLE) ×1 IMPLANT
NS IRRIG 1000ML POUR BTL (IV SOLUTION) ×2 IMPLANT
PACK BASIN DAY SURGERY FS (CUSTOM PROCEDURE TRAY) ×2 IMPLANT
PAD CAST 3X4 CTTN HI CHSV (CAST SUPPLIES) ×1 IMPLANT
PADDING CAST ABS 4INX4YD NS (CAST SUPPLIES)
PADDING CAST ABS COTTON 4X4 ST (CAST SUPPLIES) ×1 IMPLANT
PADDING CAST COTTON 3X4 STRL (CAST SUPPLIES) ×2
SPONGE GAUZE 4X4 12PLY (GAUZE/BANDAGES/DRESSINGS) ×2 IMPLANT
STOCKINETTE 4X48 STRL (DRAPES) ×2 IMPLANT
SUT VICRYL 4-0 PS2 18IN ABS (SUTURE) IMPLANT
SUT VICRYL RAPIDE 4/0 PS 2 (SUTURE) ×2 IMPLANT
SYR BULB 3OZ (MISCELLANEOUS) ×2 IMPLANT
SYR CONTROL 10ML LL (SYRINGE) ×1 IMPLANT
TOWEL OR 17X24 6PK STRL BLUE (TOWEL DISPOSABLE) ×2 IMPLANT
UNDERPAD 30X30 INCONTINENT (UNDERPADS AND DIAPERS) ×2 IMPLANT

## 2012-03-22 NOTE — Anesthesia Preprocedure Evaluation (Signed)
Anesthesia Evaluation  Patient identified by MRN, date of birth, ID band Patient awake    Reviewed: Allergy & Precautions, H&P , NPO status , Patient's Chart, lab work & pertinent test results, reviewed documented beta blocker date and time   Airway Mallampati: II TM Distance: >3 FB Neck ROM: full    Dental   Pulmonary shortness of breath, asthma ,  breath sounds clear to auscultation        Cardiovascular +CHF negative cardio ROS  Rhythm:regular     Neuro/Psych PSYCHIATRIC DISORDERS Anxiety Depression negative neurological ROS  negative psych ROS   GI/Hepatic Neg liver ROS, GERD-  Medicated and Controlled,  Endo/Other  negative endocrine ROS  Renal/GU negative Renal ROS  negative genitourinary   Musculoskeletal   Abdominal   Peds  Hematology negative hematology ROS (+)   Anesthesia Other Findings See surgeon's H&P   Reproductive/Obstetrics negative OB ROS                           Anesthesia Physical Anesthesia Plan  ASA: III  Anesthesia Plan: MAC and Bier Block   Post-op Pain Management:    Induction: Intravenous  Airway Management Planned: Simple Face Mask  Additional Equipment:   Intra-op Plan:   Post-operative Plan:   Informed Consent: I have reviewed the patients History and Physical, chart, labs and discussed the procedure including the risks, benefits and alternatives for the proposed anesthesia with the patient or authorized representative who has indicated his/her understanding and acceptance.   Dental Advisory Given  Plan Discussed with: CRNA and Surgeon  Anesthesia Plan Comments:         Anesthesia Quick Evaluation

## 2012-03-22 NOTE — Brief Op Note (Signed)
03/22/2012  1:25 PM  PATIENT:  [name]  61 y.o. female  PRE-OPERATIVE DIAGNOSIS:  right carpal tunnel syndrome  POST-OPERATIVE DIAGNOSIS:  right carpal tunnel syndrome  PROCEDURE:  Procedure(s) (LRB) with comments: CARPAL TUNNEL RELEASE (Right) - right carpal tunnel release  SURGEON:  Surgeon(s) and Role:    * Nicki Reaper, MD - Primary  PHYSICIAN ASSISTANT:   ASSISTANTS: none   ANESTHESIA:   local and regional  EBL:  Total I/O In: 600 [I.V.:600] Out: -   BLOOD ADMINISTERED:none  DRAINS: none   LOCAL MEDICATIONS USED:  MARCAINE     SPECIMEN:  No Specimen  DISPOSITION OF SPECIMEN:  N/A  COUNTS:  YES  TOURNIQUET:   Total Tourniquet Time Documented: Forearm (Right) - 21 minutes  DICTATION: .Other Dictation: Dictation Number [id number]  PLAN OF CARE: Discharge to home after PACU  PATIENT DISPOSITION:  PACU - hemodynamically stable.

## 2012-03-22 NOTE — Transfer of Care (Signed)
Immediate Anesthesia Transfer of Care Note  Patient: [name]  Procedure(s) Performed: Procedure(s) (LRB) with comments: CARPAL TUNNEL RELEASE (Right) - right carpal tunnel release  Patient Location: PACU  Anesthesia Type: Bier block  Level of Consciousness: awake and alert   Airway & Oxygen Therapy: Patient Spontanous Breathing  Post-op Assessment: Report given to PACU RN  Post vital signs: Reviewed and stable  Complications: No apparent anesthesia complications

## 2012-03-22 NOTE — Anesthesia Postprocedure Evaluation (Signed)
Anesthesia Post Note  Patient: [name]  Procedure(s) Performed: Procedure(s) (LRB): CARPAL TUNNEL RELEASE (Right)  Anesthesia type: MAC  Patient location: PACU  Post pain: Pain level controlled  Post assessment: Patient's Cardiovascular Status Stable  Last Vitals:  Filed Vitals:   03/22/12 1330  BP: 115/69  Pulse: 76  Temp:   Resp: 20    Post vital signs: Reviewed and stable  Level of consciousness: alert  Complications: No apparent anesthesia complications

## 2012-03-22 NOTE — H&P (Signed)
[name] is a 61 year old right hand dominant female referred by Dr. Carylon Perches for a consultation with respect to pain, numbness and tingling bilateral hands, right greater than left. This has been going on for several years and recently getting worse. It wakes her almost every night. She has no history of injury to the hands or neck. She complains of constant, severe, sharp, throbbing and aching pain with numbness and weakness going up to her shoulder. Activity and work makes it worse. She has been wearing a brace. She has had a Cortisone course which has helped. No history of diabetes, thyroid problems, or gout. She does have a history of arthritis. There is a family history of diabetes and arthritis. She is a former Neurosurgeon. . She has had her nerve conductions revealing a motor delay of 5.5 on the left, 7.3 on the right. Sensory delay of 3.3 on the left and 4.1 on the right with an amplitude diminution to 15 on the left and 3.3 on the right.   Past Medical History: She is allergic to Sulfa, Codeine. She is taking Paxil, Restoril, Norco, Lasix, Cipro, Tessalon, Seroquel, and potassium. She has had a sterilization in the past.  Family Medical History: Positive for diabetes, high BP and arthritis.  Social History: She does not smoke or drink. She is married.  Review of Systems: Positive for weight loss, loss of appetite, glasses, hearing impairment, cataracts, ringing in her ears, pneumonia, pain with urination, blood in her urine, depression, nervousness, sleep disorder, easy bruising, otherwise negative. [name] is an 61 y.o. female.   Chief Complaint: CTS RT HPI: see above  Past Medical History  Diagnosis Date  . Anxiety   . Depression     OCD  . CHF (congestive heart failure)     swelling of feet & ankles  . Shortness of breath     with exertion  . GERD (gastroesophageal reflux disease)     severe  . Asthma     had 1 episode 1 year ago-no more problem    Past Surgical  History  Procedure Date  . Tubal ligation   . Tonsillectomy     History reviewed. No pertinent family history. Social History:  reports that she has never smoked. She has never used smokeless tobacco. She reports that she does not drink alcohol or use illicit drugs.  Allergies:  Allergies  Allergen Reactions  . Ciprofloxacin Other (See Comments)    Jaundice  Recently took course of cipro without difficulty  . Codeine Other (See Comments)    Unknown-pt is not sure of reaction  . Sulfa Antibiotics Other (See Comments)    Unknown- pt unsure of reaction    Medications Prior to Admission  Medication Sig Dispense Refill  . benzonatate (TESSALON) 200 MG capsule Take 200 mg by mouth 3 (three) times daily as needed.      . furosemide (LASIX) 40 MG tablet Take 40 mg by mouth daily.      Marland Kitchen HYDROcodone-acetaminophen (NORCO/VICODIN) 5-325 MG per tablet Take 1 tablet by mouth every 4 (four) hours. Pt takes more frequently at times than prescribed. Has been taking for several months.      Marland Kitchen ibuprofen (ADVIL,MOTRIN) 200 MG tablet Take by mouth every 6 (six) hours as needed. For pain       . naproxen sodium (ANAPROX) 220 MG tablet Take 220 mg by mouth every evening.        Marland Kitchen PARoxetine (PAXIL) 20 MG tablet Take  40 mg by mouth every evening.       . polyethylene glycol powder (GLYCOLAX/MIRALAX) powder Take 17 g by mouth 2 (two) times daily.      . potassium chloride SA (K-DUR,KLOR-CON) 20 MEQ tablet Take 20 mEq by mouth daily.      . QUEtiapine (SEROQUEL) 50 MG tablet Take 25 mg by mouth at bedtime.       . temazepam (RESTORIL) 15 MG capsule Take 15 mg by mouth at bedtime as needed.      . clonazePAM (KLONOPIN) 1 MG tablet Take 0.5-1 mg by mouth 2 (two) times daily. 1 mg in the morning and 0.5 mg at night      . omeprazole (PRILOSEC) 20 MG capsule Take 20 mg by mouth daily.         Results for orders placed during the hospital encounter of 03/22/12 (from the past 48 hour(s))  BASIC METABOLIC  PANEL     Status: Abnormal   Collection Time   03/21/12 10:30 AM      Component Value Range Comment   Sodium 140  135 - 145 mEq/L    Potassium 4.4  3.5 - 5.1 mEq/L    Chloride 102  96 - 112 mEq/L    CO2 29  19 - 32 mEq/L    Glucose, Bld 88  70 - 99 mg/dL    BUN 15  6 - 23 mg/dL    Creatinine, Ser 1.61  0.50 - 1.10 mg/dL    Calcium 9.8  8.4 - 09.6 mg/dL    GFR calc non Af Amer 89 (*) >90 mL/min    GFR calc Af Amer >90  >90 mL/min   POCT HEMOGLOBIN-HEMACUE     Status: Normal   Collection Time   03/22/12 11:53 AM      Component Value Range Comment   Hemoglobin 12.8  12.0 - 15.0 g/dL     No results found.   Pertinent items are noted in HPI.  Blood pressure 113/73, pulse 58, temperature 97.6 F (36.4 C), temperature source Oral, resp. rate 18, height 5' (1.524 m), weight 178 lb 2 oz (80.797 kg), SpO2 100.00%.  General appearance: alert, cooperative and appears stated age Head: Normocephalic, without obvious abnormality Neck: no adenopathy Resp: clear to auscultation bilaterally Cardio: regular rate and rhythm, S1, S2 normal, no murmur, click, rub or gallop GI: soft, non-tender; bowel sounds normal; no masses,  no organomegaly Extremities: extremities normal, atraumatic, no cyanosis or edema Pulses: 2+ and symmetric Skin: Skin color, texture, turgor normal. No rashes or lesions Neurologic: Grossly normal Incision/Wound: na  Assessment/Plan DX: CTS RT We have discussed the possibility of surgical decompression to the right carpal canal. The pre, peri and postoperative course were discussed along with the risks and complications.  The patient is aware there is no guarantee with the surgery, possibility of infection, recurrence, injury to arteries, nerves, tendons, incomplete relief of symptoms and dystrophy.  She would like to go ahead and schedule her right carpal tunnel to be released. This will be scheduled as an outpatient.   Shneur Whittenburg R 03/22/2012, 12:26 PM

## 2012-03-22 NOTE — Op Note (Signed)
Dictated number: [id number]

## 2012-03-23 ENCOUNTER — Encounter (HOSPITAL_BASED_OUTPATIENT_CLINIC_OR_DEPARTMENT_OTHER): Payer: Self-pay | Admitting: Orthopedic Surgery

## 2012-03-23 NOTE — Op Note (Signed)
[name], [name]                  ACCOUNT NO.:  [id number]  MEDICAL RECORD NO.:  [id number]  LOCATION:                                 FACILITY:  PHYSICIAN:  Cindee Salt, M.D.            DATE OF BIRTH:  DATE OF PROCEDURE:  03/22/2012 DATE OF DISCHARGE:                              OPERATIVE REPORT   PREOPERATIVE DIAGNOSIS:  Carpal tunnel syndrome, right hand.  POSTOPERATIVE DIAGNOSES:  Carpal tunnel syndrome, right hand.  OPERATION:  Decompression, right median nerve.  SURGEON:  Cindee Salt, M.D.  ANESTHESIA:  Forearm-based IV regional with local infiltration.  ANESTHESIOLOGIST:  Janetta Hora. Gelene Mink, M.D.  HISTORY:  The patient is a 61 year old female with a history of bilateral carpal tunnel syndrome, EMG and nerve conductions are positive.  This has not responded to conservative treatment.  Pre, peri, and postoperative course have been discussed along with risks and complications.  She is aware that there is no guarantee with the surgery, possibility of infection, recurrence, injury to arteries, nerves, tendons, incomplete relief of symptoms, and dystrophy.  In the preoperative area, the patient was seen, the extremity marked by both the patient and surgeon.  Antibiotic given.  PROCEDURE:  The patient was brought to the operating room where a forearm-based IV regional anesthetic was carried out without difficulty. She was prepped using ChloraPrep, supine position, right arm free.  A 3- minute dry time was allowed.  Time-out taken confirming the patient, procedure.  Longitudinal incision was made in the palm, carried down through subcutaneous tissue.  Bleeders were electrocauterized.  Palmar fascia was split.  Superficial palmar arch identified.  Flexor tendon to the ring and little finger identified to the ulnar side of the median nerve.  The carpal retinaculum was also incised with sharp dissection. Right angle and Sewall retractor were placed between skin and  forearm fascia.  The fascia was released for approximately 1.5 cm proximal to the wrist crease under direct vision.  The canal was explored in order to takeoff.  The motor branch was noted.  This was left intact.  The wound was copiously irrigated and moderate tenosynovitis was present. Compression to the nerve was apparent.  No further lesions were identified.  The wound was irrigated.  Skin was closed with interrupted 4-0 Vicryl Rapide sutures.  Local infiltration with 0.25% Marcaine without epinephrine was given, approximately 6 mL was used.  A sterile compressive dressing with the fingers fully and free was applied.  On deflation of the tourniquet, all fingers immediately pinked.  She was taken to the recovery room.          ______________________________ Cindee Salt, M.D.     GK/MEDQ  D:  03/22/2012  T:  03/23/2012  Job:  [id number]

## 2012-04-20 ENCOUNTER — Ambulatory Visit (INDEPENDENT_AMBULATORY_CARE_PROVIDER_SITE_OTHER): Payer: Medicare Other | Admitting: Otolaryngology

## 2012-04-20 DIAGNOSIS — H9209 Otalgia, unspecified ear: Secondary | ICD-10-CM

## 2012-04-20 DIAGNOSIS — H905 Unspecified sensorineural hearing loss: Secondary | ICD-10-CM

## 2012-04-20 DIAGNOSIS — H6120 Impacted cerumen, unspecified ear: Secondary | ICD-10-CM

## 2012-05-23 ENCOUNTER — Telehealth: Payer: Self-pay

## 2012-05-23 NOTE — Telephone Encounter (Signed)
Called pt. She has some constipation and occasional blood on tissue. OV scheduled for 05/24/2012 at 2:00 AM with Gerrit Halls, NP.

## 2012-05-23 NOTE — Telephone Encounter (Signed)
Pt left VM she wants to scheduled for colonoscopy. I returned her call and line was busy.

## 2012-05-24 ENCOUNTER — Encounter: Payer: Self-pay | Admitting: Gastroenterology

## 2012-05-24 ENCOUNTER — Telehealth: Payer: Self-pay | Admitting: Gastroenterology

## 2012-05-24 ENCOUNTER — Ambulatory Visit: Payer: Medicare Other | Admitting: Gastroenterology

## 2012-05-24 ENCOUNTER — Ambulatory Visit (INDEPENDENT_AMBULATORY_CARE_PROVIDER_SITE_OTHER): Payer: Medicare Other | Admitting: Gastroenterology

## 2012-05-24 VITALS — BP 123/80 | HR 69 | Temp 98.3°F | Ht 61.0 in | Wt 173.0 lb

## 2012-05-24 DIAGNOSIS — K219 Gastro-esophageal reflux disease without esophagitis: Secondary | ICD-10-CM

## 2012-05-24 DIAGNOSIS — Z1211 Encounter for screening for malignant neoplasm of colon: Secondary | ICD-10-CM

## 2012-05-24 NOTE — Telephone Encounter (Signed)
Pt was a no show

## 2012-05-24 NOTE — Progress Notes (Signed)
Referring Provider: Carylon Perches, MD Primary Care Physician:  Carylon Perches, MD Primary Gastroenterologist:  Dr. Jena Gauss   Chief Complaint  Patient presents with  . Colonoscopy    HPI:   [name] presents today for an initial screening colonoscopy. No abdominal pain. No constipation or diarrhea. Has had difficulties with constipation in the past, but she is now drinking lots of water, trying to eat high fiber foods. Has seen scant paper hematochezia. Was told by [facility] she has reflux. Sings in the choir. Was told to rest her voice. If she gets in a hot room, she coughs. While singing would cough. Taking Protonix. Has been increased to BID. Doesn't eat before bed, eats a few hours prior. Trying to change eating habits. Denies dysphagia. States reflux is much better. Since toning down her voice, less coughing.    Past Medical History  Diagnosis Date  . Anxiety   . Depression     OCD  . CHF (congestive heart failure)     swelling of feet & ankles  . Shortness of breath     with exertion  . GERD (gastroesophageal reflux disease)     severe  . Asthma     had 1 episode 1 year ago-no more problem    Past Surgical History  Procedure Date  . Tubal ligation   . Tonsillectomy   . Carpal tunnel release 03/22/2012    Procedure: CARPAL TUNNEL RELEASE;  Surgeon: Nicki Reaper, MD;  Location: [facility];  Service: Orthopedics;  Laterality: Right;  right carpal tunnel release    Current Outpatient Prescriptions  Medication Sig Dispense Refill  . benzonatate (TESSALON) 200 MG capsule Take 200 mg by mouth 3 (three) times daily as needed.      . furosemide (LASIX) 40 MG tablet Take 40 mg by mouth daily.      . pantoprazole (PROTONIX) 40 MG tablet Take 40 mg by mouth 2 (two) times daily.       Marland Kitchen PARoxetine (PAXIL) 20 MG tablet Take 40 mg by mouth every evening.       . polyethylene glycol powder (GLYCOLAX/MIRALAX) powder Take 17 g by mouth 2 (two) times daily.      . potassium chloride SA  (K-DUR,KLOR-CON) 20 MEQ tablet Take 20 mEq by mouth daily.      . temazepam (RESTORIL) 15 MG capsule Take 15 mg by mouth at bedtime as needed.      . clonazePAM (KLONOPIN) 1 MG tablet Take 0.5-1 mg by mouth 2 (two) times daily. 1 mg in the morning and 0.5 mg at night      . HYDROcodone-acetaminophen (NORCO/VICODIN) 5-325 MG per tablet Take 1 tablet by mouth every 4 (four) hours. Pt takes more frequently at times than prescribed. Has been taking for several months.      Marland Kitchen ibuprofen (ADVIL,MOTRIN) 200 MG tablet Take by mouth every 6 (six) hours as needed. For pain       . naproxen sodium (ANAPROX) 220 MG tablet Take 220 mg by mouth every evening.        Marland Kitchen omeprazole (PRILOSEC) 20 MG capsule Take 20 mg by mouth daily.       . polyethylene glycol-electrolytes (TRILYTE) 420 G solution Take 4,000 mLs by mouth as directed.  4000 mL  0  . QUEtiapine (SEROQUEL) 50 MG tablet Take 25 mg by mouth at bedtime.         Allergies as of 05/24/2012 - Review Complete 05/24/2012  Allergen Reaction Noted  .  Ciprofloxacin Other (See Comments) 02/22/2011  . Codeine Other (See Comments) 02/22/2011  . Sulfa antibiotics Other (See Comments) 02/22/2011    Family History  Problem Relation Age of Onset  . Colon cancer Neg Hx     History   Social History  . Marital Status: Married    Spouse Name: N/A    Number of Children: 1  . Years of Education: N/A   Occupational History  . retired    Social History Main Topics  . Smoking status: Never Smoker   . Smokeless tobacco: Never Used  . Alcohol Use: No  . Drug Use: No  . Sexually Active: Not on file   Other Topics Concern  . Not on file   Social History Narrative  . No narrative on file    Review of Systems: Gen: Denies any fever, chills, loss of appetite, fatigue, weight loss. CV: Denies chest pain, heart palpitations, syncope, peripheral edema. Resp: +cough GI: Denies dysphagia or odynophagia. Denies hematemesis, fecal incontinence, or jaundice.    GU : Denies urinary burning, urinary frequency, urinary incontinence.  MS: Denies joint pain, muscle weakness, cramps, limited movement Derm: Denies rash, itching, dry skin Psych: Denies depression, anxiety, confusion or memory loss  Heme: Denies bruising, bleeding, and enlarged lymph nodes.  Physical Exam: BP 123/80  Pulse 69  Temp 98.3 F (36.8 C) (Temporal)  Ht 5\' 1"  (1.549 m)  Wt 173 lb (78.472 kg)  BMI 32.69 kg/m2 General:   Alert and oriented. Well-developed, well-nourished, pleasant and cooperative. Head:  Normocephalic and atraumatic. Eyes:  Conjunctiva pink, sclera clear, no icterus.   Conjunctiva pink. Ears:  Normal auditory acuity. Nose:  No deformity, discharge,  or lesions. Mouth:  No deformity or lesions, mucosa pink and moist.  Neck:  Supple, without mass or thyromegaly. Lungs:  Clear to auscultation bilaterally, without wheezing, rales, or rhonchi.  Heart:  S1, S2 present without murmurs noted.  Abdomen:  +BS, soft, non-tender and non-distended. Without mass or HSM. No rebound or guarding. No hernias noted. Rectal:  Deferred  Msk:  Symmetrical without gross deformities. Normal posture. Extremities:  Without clubbing or edema. Neurologic:  Alert and  oriented x4;  grossly normal neurologically. Skin:  Intact, warm and dry without significant lesions or rashes Cervical Nodes:  No significant cervical adenopathy. Psych:  Alert and cooperative. Normal mood and affect.

## 2012-05-24 NOTE — Telephone Encounter (Signed)
Pt showed up late, but I put her back on schedule to see AS since we had an opening

## 2012-05-24 NOTE — Patient Instructions (Addendum)
We have set you up for a colonoscopy with Dr. Jena Gauss.  Continue the excellent habits you are doing to help control reflux and constipation.  Further recommendations to follow once the procedure is complete.

## 2012-05-25 MED ORDER — PEG 3350-KCL-NA BICARB-NACL 420 G PO SOLR: 4000.0000 mL | ORAL | Status: DC

## 2012-05-28 DIAGNOSIS — Z1211 Encounter for screening for malignant neoplasm of colon: Secondary | ICD-10-CM | POA: Insufficient documentation

## 2012-05-28 DIAGNOSIS — K219 Gastro-esophageal reflux disease without esophagitis: Secondary | ICD-10-CM | POA: Insufficient documentation

## 2012-05-28 NOTE — Assessment & Plan Note (Signed)
Continue Protonix BID. Pt has instituted dietary and behavior modification, which significantly improved symptoms. Notes chronic cough. NO dysphagia. May eventually need upper GI evaluation; however, pt seems to be improving with changing habits.

## 2012-05-28 NOTE — Assessment & Plan Note (Signed)
61 year old pleasant female with intermittent paper hematochezia, likely benign anorectal source. No prior colonoscopy, desires to proceed with average-risk screening colonoscopy in near future. Historical constipation well-controlled now with dietary changes, increased fluids.   Proceed with TCS with Dr. Jena Gauss in near future: the risks, benefits, and alternatives have been discussed with the patient in detail. The patient states understanding and desires to proceed. Phenergan 12.5 mg on call due to polypharmacy

## 2012-05-29 NOTE — Progress Notes (Signed)
Faxed to PCP

## 2012-06-07 ENCOUNTER — Encounter (HOSPITAL_COMMUNITY): Payer: Self-pay | Admitting: Pharmacy Technician

## 2012-06-19 ENCOUNTER — Ambulatory Visit (HOSPITAL_COMMUNITY)
Admission: RE | Admit: 2012-06-19 | Discharge: 2012-06-19 | Disposition: A | Discharge status: Home / Self Care | Payer: Medicare Other | Source: Ambulatory Visit | Attending: Internal Medicine | Admitting: Internal Medicine

## 2012-06-19 ENCOUNTER — Encounter (HOSPITAL_COMMUNITY): Payer: Self-pay | Admitting: *Deleted

## 2012-06-19 ENCOUNTER — Encounter (HOSPITAL_COMMUNITY)
Admission: RE | Disposition: A | Discharge status: Home / Self Care | Payer: Self-pay | Source: Ambulatory Visit | Attending: Internal Medicine

## 2012-06-19 DIAGNOSIS — J45909 Unspecified asthma, uncomplicated: Secondary | ICD-10-CM | POA: Insufficient documentation

## 2012-06-19 DIAGNOSIS — Z1211 Encounter for screening for malignant neoplasm of colon: Secondary | ICD-10-CM | POA: Insufficient documentation

## 2012-06-19 HISTORY — PX: COLONOSCOPY: SHX5424

## 2012-06-19 SURGERY — COLONOSCOPY
Anesthesia: Moderate Sedation

## 2012-06-19 MED ORDER — MEPERIDINE HCL 100 MG/ML IJ SOLN
INTRAMUSCULAR | Status: AC
  Filled 2012-06-19: qty 1

## 2012-06-19 MED ORDER — PROMETHAZINE HCL 25 MG/ML IJ SOLN
INTRAMUSCULAR | Status: AC
  Filled 2012-06-19: qty 1

## 2012-06-19 MED ORDER — PROMETHAZINE HCL 25 MG/ML IJ SOLN
12.5000 mg | Freq: Once | INTRAMUSCULAR | Status: AC
  Administered 2012-06-19: 12.5 mg via INTRAVENOUS

## 2012-06-19 MED ORDER — STERILE WATER FOR IRRIGATION IR SOLN
Status: DC | PRN
  Administered 2012-06-19: 08:00:00

## 2012-06-19 MED ORDER — MIDAZOLAM HCL 5 MG/5ML IJ SOLN
INTRAMUSCULAR | Status: DC | PRN
  Administered 2012-06-19 (×3): 1 mg via INTRAVENOUS

## 2012-06-19 MED ORDER — MIDAZOLAM HCL 5 MG/5ML IJ SOLN
INTRAMUSCULAR | Status: AC
  Filled 2012-06-19: qty 10

## 2012-06-19 MED ORDER — SODIUM CHLORIDE 0.9 % IJ SOLN
INTRAMUSCULAR | Status: AC
  Filled 2012-06-19: qty 10

## 2012-06-19 MED ORDER — MEPERIDINE HCL 100 MG/ML IJ SOLN
INTRAMUSCULAR | Status: DC | PRN
  Administered 2012-06-19: 25 mg via INTRAVENOUS

## 2012-06-19 MED ORDER — SODIUM CHLORIDE 0.45 % IV SOLN
INTRAVENOUS | Status: DC
  Administered 2012-06-19: 08:00:00 via INTRAVENOUS

## 2012-06-19 NOTE — H&P (View-Only) (Signed)
 Referring Provider: Fagan, Roy, MD Primary Care Physician:  FAGAN,ROY, MD Primary Gastroenterologist:  Dr. Rourk   Chief Complaint  Patient presents with  . Colonoscopy    HPI:   [name] presents today for an initial screening colonoscopy. No abdominal pain. No constipation or diarrhea. Has had difficulties with constipation in the past, but she is now drinking lots of water, trying to eat high fiber foods. Has seen scant paper hematochezia. Was told by [facility] she has reflux. Sings in the choir. Was told to rest her voice. If she gets in a hot room, she coughs. While singing would cough. Taking Protonix. Has been increased to BID. Doesn't eat before bed, eats a few hours prior. Trying to change eating habits. Denies dysphagia. States reflux is much better. Since toning down her voice, less coughing.    Past Medical History  Diagnosis Date  . Anxiety   . Depression     OCD  . CHF (congestive heart failure)     swelling of feet & ankles  . Shortness of breath     with exertion  . GERD (gastroesophageal reflux disease)     severe  . Asthma     had 1 episode 1 year ago-no more problem    Past Surgical History  Procedure Date  . Tubal ligation   . Tonsillectomy   . Carpal tunnel release 03/22/2012    Procedure: CARPAL TUNNEL RELEASE;  Surgeon: Gary R Kuzma, MD;  [facility];  Service: Orthopedics;  Laterality: Right;  right carpal tunnel release    Current Outpatient Prescriptions  Medication Sig Dispense Refill  . benzonatate (TESSALON) 200 MG capsule Take 200 mg by mouth 3 (three) times daily as needed.      . furosemide (LASIX) 40 MG tablet Take 40 mg by mouth daily.      . pantoprazole (PROTONIX) 40 MG tablet Take 40 mg by mouth 2 (two) times daily.       . PARoxetine (PAXIL) 20 MG tablet Take 40 mg by mouth every evening.       . polyethylene glycol powder (GLYCOLAX/MIRALAX) powder Take 17 g by mouth 2 (two) times daily.      . potassium chloride SA  (K-DUR,KLOR-CON) 20 MEQ tablet Take 20 mEq by mouth daily.      . temazepam (RESTORIL) 15 MG capsule Take 15 mg by mouth at bedtime as needed.      . clonazePAM (KLONOPIN) 1 MG tablet Take 0.5-1 mg by mouth 2 (two) times daily. 1 mg in the morning and 0.5 mg at night      . HYDROcodone-acetaminophen (NORCO/VICODIN) 5-325 MG per tablet Take 1 tablet by mouth every 4 (four) hours. Pt takes more frequently at times than prescribed. Has been taking for several months.      . ibuprofen (ADVIL,MOTRIN) 200 MG tablet Take by mouth every 6 (six) hours as needed. For pain       . naproxen sodium (ANAPROX) 220 MG tablet Take 220 mg by mouth every evening.        . omeprazole (PRILOSEC) 20 MG capsule Take 20 mg by mouth daily.       . polyethylene glycol-electrolytes (TRILYTE) 420 G solution Take 4,000 mLs by mouth as directed.  4000 mL  0  . QUEtiapine (SEROQUEL) 50 MG tablet Take 25 mg by mouth at bedtime.         Allergies as of 05/24/2012 - Review Complete 05/24/2012  Allergen Reaction Noted  .   Ciprofloxacin Other (See Comments) 02/22/2011  . Codeine Other (See Comments) 02/22/2011  . Sulfa antibiotics Other (See Comments) 02/22/2011    Family History  Problem Relation Age of Onset  . Colon cancer Neg Hx     History   Social History  . Marital Status: Married    Spouse Name: N/A    Number of Children: 1  . Years of Education: N/A   Occupational History  . retired    Social History Main Topics  . Smoking status: Never Smoker   . Smokeless tobacco: Never Used  . Alcohol Use: No  . Drug Use: No  . Sexually Active: Not on file   Other Topics Concern  . Not on file   Social History Narrative  . No narrative on file    Review of Systems: Gen: Denies any fever, chills, loss of appetite, fatigue, weight loss. CV: Denies chest pain, heart palpitations, syncope, peripheral edema. Resp: +cough GI: Denies dysphagia or odynophagia. Denies hematemesis, fecal incontinence, or jaundice.    GU : Denies urinary burning, urinary frequency, urinary incontinence.  MS: Denies joint pain, muscle weakness, cramps, limited movement Derm: Denies rash, itching, dry skin Psych: Denies depression, anxiety, confusion or memory loss  Heme: Denies bruising, bleeding, and enlarged lymph nodes.  Physical Exam: BP 123/80  Pulse 69  Temp 98.3 F (36.8 C) (Temporal)  Ht 5' 1" (1.549 m)  Wt 173 lb (78.472 kg)  BMI 32.69 kg/m2 General:   Alert and oriented. Well-developed, well-nourished, pleasant and cooperative. Head:  Normocephalic and atraumatic. Eyes:  Conjunctiva pink, sclera clear, no icterus.   Conjunctiva pink. Ears:  Normal auditory acuity. Nose:  No deformity, discharge,  or lesions. Mouth:  No deformity or lesions, mucosa pink and moist.  Neck:  Supple, without mass or thyromegaly. Lungs:  Clear to auscultation bilaterally, without wheezing, rales, or rhonchi.  Heart:  S1, S2 present without murmurs noted.  Abdomen:  +BS, soft, non-tender and non-distended. Without mass or HSM. No rebound or guarding. No hernias noted. Rectal:  Deferred  Msk:  Symmetrical without gross deformities. Normal posture. Extremities:  Without clubbing or edema. Neurologic:  Alert and  oriented x4;  grossly normal neurologically. Skin:  Intact, warm and dry without significant lesions or rashes Cervical Nodes:  No significant cervical adenopathy. Psych:  Alert and cooperative. Normal mood and affect.  

## 2012-06-19 NOTE — Interval H&P Note (Signed)
History and Physical Interval Note:  06/19/2012 8:19 AM  [name]  has presented today for surgery, with the diagnosis of Screening TCS  The various methods of treatment have been discussed with the patient and family. After consideration of risks, benefits and other options for treatment, the patient has consented to  Procedure(s) (LRB) with comments: COLONOSCOPY (N/A) - 8:30/Give Phenergan 12.5 mg IV 30 mins prior to procedure as a surgical intervention .  The patient's history has been reviewed, patient examined, no change in status, stable for surgery.  I have reviewed the patient's chart and labs.  Questions were answered to the patient's satisfaction.     Eula Listen  As above. Colonoscopy per plan.The risks, benefits, limitations, alternatives and imponderables have been reviewed with the patient. Questions have been answered. All parties are agreeable.

## 2012-06-19 NOTE — Op Note (Signed)
[facility] [address] [place], [address]   COLONOSCOPY PROCEDURE REPORT  PATIENT: [name], Dao J.  MR#:         [id number] BIRTHDATE: 1950-12-15 , 61  yrs. old GENDER: Female ENDOSCOPIST: R.  Roetta Sessions, MD FACP FACG REFERRED BY:  Carylon Perches, M.D. PROCEDURE DATE:  06/19/2012 PROCEDURE:     Screening colonoscopy  INDICATIONS: First-ever average risk screening examination-rare paper hematochezia  INFORMED CONSENT:  The risks, benefits, alternatives and imponderables including but not limited to bleeding, perforation as well as the possibility of a missed lesion have been reviewed.  The potential for biopsy, lesion removal, etc. have also been discussed.  Questions have been answered.  All parties agreeable. Please see the history and physical in the medical record for more information.  MEDICATIONS: Versed 3 mg IV and Demerol 25 mg IV in divided doses. Phenergan 12.5 mg IV  DESCRIPTION OF PROCEDURE:  After a digital rectal exam was performed, the Pentax Colonoscope [id number]  colonoscope was advanced from the anus through the rectum and colon to the area of the cecum, ileocecal valve and appendiceal orifice.  The cecum was deeply intubated.  These structures were well-seen and photographed for the record.  From the level of the cecum and ileocecal valve, the scope was slowly and cautiously withdrawn.  The mucosal surfaces were carefully surveyed utilizing scope tip deflection to facilitate fold flattening as needed.  The scope was pulled down into the rectum where a thorough examination including retroflexion was performed.    FINDINGS:  Adequate preparation. Normal rectum. Normal-appearing colonic mucosa.  THERAPEUTIC / DIAGNOSTIC MANEUVERS PERFORMED:  None  COMPLICATIONS: None  CECAL WITHDRAWAL TIME:  7 minutes  IMPRESSION:  Normal rectum and colon  RECOMMENDATIONS: Repeat screening colonoscopy in 10  years   _______________________________ eSigned:  R. Roetta Sessions, MD FACP [facility] 06/19/2012 9:02 AM   CC:

## 2012-06-20 ENCOUNTER — Encounter (HOSPITAL_COMMUNITY): Payer: Self-pay | Admitting: Internal Medicine

## 2012-06-27 ENCOUNTER — Other Ambulatory Visit (HOSPITAL_COMMUNITY): Payer: Self-pay | Admitting: Internal Medicine

## 2012-06-27 DIAGNOSIS — Z139 Encounter for screening, unspecified: Secondary | ICD-10-CM

## 2012-07-17 ENCOUNTER — Ambulatory Visit (HOSPITAL_COMMUNITY): Payer: Medicare Other

## 2012-07-18 ENCOUNTER — Ambulatory Visit (HOSPITAL_COMMUNITY)
Admission: RE | Admit: 2012-07-18 | Discharge: 2012-07-18 | Disposition: A | Discharge status: Home / Self Care | Payer: Medicare Other | Source: Ambulatory Visit | Attending: Internal Medicine | Admitting: Internal Medicine

## 2012-07-18 DIAGNOSIS — Z139 Encounter for screening, unspecified: Secondary | ICD-10-CM

## 2012-07-18 DIAGNOSIS — Z1231 Encounter for screening mammogram for malignant neoplasm of breast: Secondary | ICD-10-CM | POA: Insufficient documentation

## 2012-07-26 ENCOUNTER — Other Ambulatory Visit: Payer: Self-pay | Admitting: Internal Medicine

## 2012-07-26 DIAGNOSIS — R928 Other abnormal and inconclusive findings on diagnostic imaging of breast: Secondary | ICD-10-CM

## 2012-07-27 ENCOUNTER — Other Ambulatory Visit: Payer: Self-pay | Admitting: Internal Medicine

## 2012-07-27 DIAGNOSIS — R928 Other abnormal and inconclusive findings on diagnostic imaging of breast: Secondary | ICD-10-CM

## 2012-08-04 ENCOUNTER — Ambulatory Visit
Admission: RE | Admit: 2012-08-04 | Discharge: 2012-08-04 | Disposition: A | Discharge status: Home / Self Care | Payer: Medicare Other | Source: Ambulatory Visit | Attending: Internal Medicine | Admitting: Internal Medicine

## 2012-08-04 DIAGNOSIS — R928 Other abnormal and inconclusive findings on diagnostic imaging of breast: Secondary | ICD-10-CM

## 2012-10-20 ENCOUNTER — Emergency Department (HOSPITAL_COMMUNITY)
Admission: EM | Admit: 2012-10-20 | Discharge: 2012-10-20 | Disposition: A | Discharge status: Home / Self Care | Payer: Medicare PPO | Attending: Emergency Medicine | Admitting: Emergency Medicine

## 2012-10-20 ENCOUNTER — Other Ambulatory Visit: Payer: Self-pay

## 2012-10-20 ENCOUNTER — Emergency Department (HOSPITAL_COMMUNITY): Payer: Medicare PPO

## 2012-10-20 ENCOUNTER — Encounter (HOSPITAL_COMMUNITY): Payer: Self-pay | Admitting: *Deleted

## 2012-10-20 DIAGNOSIS — F329 Major depressive disorder, single episode, unspecified: Secondary | ICD-10-CM | POA: Insufficient documentation

## 2012-10-20 DIAGNOSIS — F429 Obsessive-compulsive disorder, unspecified: Secondary | ICD-10-CM | POA: Insufficient documentation

## 2012-10-20 DIAGNOSIS — F411 Generalized anxiety disorder: Secondary | ICD-10-CM | POA: Insufficient documentation

## 2012-10-20 DIAGNOSIS — J45901 Unspecified asthma with (acute) exacerbation: Secondary | ICD-10-CM | POA: Insufficient documentation

## 2012-10-20 DIAGNOSIS — K219 Gastro-esophageal reflux disease without esophagitis: Secondary | ICD-10-CM | POA: Insufficient documentation

## 2012-10-20 DIAGNOSIS — F3289 Other specified depressive episodes: Secondary | ICD-10-CM | POA: Insufficient documentation

## 2012-10-20 DIAGNOSIS — I509 Heart failure, unspecified: Secondary | ICD-10-CM | POA: Insufficient documentation

## 2012-10-20 DIAGNOSIS — R0602 Shortness of breath: Secondary | ICD-10-CM | POA: Insufficient documentation

## 2012-10-20 DIAGNOSIS — Z79899 Other long term (current) drug therapy: Secondary | ICD-10-CM | POA: Insufficient documentation

## 2012-10-20 DIAGNOSIS — R5381 Other malaise: Secondary | ICD-10-CM | POA: Insufficient documentation

## 2012-10-20 DIAGNOSIS — R5383 Other fatigue: Secondary | ICD-10-CM

## 2012-10-20 HISTORY — DX: Obsessive-compulsive disorder, unspecified: F42.9

## 2012-10-20 LAB — COMPREHENSIVE METABOLIC PANEL
ALT: 20 U/L (ref 0–35)
Alkaline Phosphatase: 80 U/L (ref 39–117)
BUN: 18 mg/dL (ref 6–23)
CO2: 30 mEq/L (ref 19–32)
Chloride: 101 mEq/L (ref 96–112)
GFR calc Af Amer: 84 mL/min — ABNORMAL LOW (ref >90)
GFR calc non Af Amer: 72 mL/min — ABNORMAL LOW (ref >90)
Glucose, Bld: 91 mg/dL (ref 70–99)
Potassium: 4.1 mEq/L (ref 3.5–5.1)
Sodium: 139 mEq/L (ref 135–145)
Total Bilirubin: 0.8 mg/dL (ref 0.3–1.2)
Total Protein: 7 g/dL (ref 6.0–8.3)

## 2012-10-20 LAB — URINALYSIS, ROUTINE W REFLEX MICROSCOPIC
Ketones, ur: NEGATIVE mg/dL
Leukocytes, UA: NEGATIVE
Protein, ur: NEGATIVE mg/dL
Urobilinogen, UA: 0.2 mg/dL (ref 0.0–1.0)

## 2012-10-20 LAB — CBC WITH DIFFERENTIAL/PLATELET
Eosinophils Absolute: 0.1 10*3/uL (ref 0.0–0.7)
Hemoglobin: 12.5 g/dL (ref 12.0–15.0)
Lymphocytes Relative: 31 % (ref 12–46)
Lymphs Abs: 1.3 10*3/uL (ref 0.7–4.0)
MCH: 33.2 pg (ref 26.0–34.0)
MCV: 100.5 fL — ABNORMAL HIGH (ref 78.0–100.0)
Monocytes Relative: 13 % — ABNORMAL HIGH (ref 3–12)
Neutrophils Relative %: 54 % (ref 43–77)
Platelets: 158 10*3/uL (ref 150–400)
RBC: 3.77 MIL/uL — ABNORMAL LOW (ref 3.87–5.11)
WBC: 4.2 10*3/uL (ref 4.0–10.5)

## 2012-10-20 LAB — LIPASE, BLOOD: Lipase: 38 U/L (ref 11–59)

## 2012-10-20 LAB — URINE MICROSCOPIC-ADD ON

## 2012-10-20 LAB — D-DIMER, QUANTITATIVE: D-Dimer, Quant: <0.27 ug/mL-FEU (ref 0.00–0.48)

## 2012-10-20 NOTE — ED Provider Notes (Signed)
History     CSN: [id number]  Arrival date & time 10/20/12  1238   First MD Initiated Contact with Patient 10/20/12 1332      Chief Complaint  Patient presents with  . Weakness     HPI Pt was seen at 1400.   Per pt, c/o gradual onset and persistence of constant generalized weakness and fatigue for the past 3 days.  States she feels "bloated" and "swollen all over" as well as intermittently mildly SOB.  Endorses she walks for exercise rather regularly, and for the past 3 days after she walks she feels "more tired" that usual.  States she has intermittently had several episodes of very brief left sided chest "pain" which improves with burping and passing gas.  Denies CP or SOB specifically occurs with activity or meals. CP lasts only seconds each episode. Denies CP or SOB occurs when she exercises.  Denies specific focal motor weakness, no tingling/numbness in extremities, no fevers, no palpitations, no cough, no abd pain, no N/V/D, no back pain.     Past Medical History  Diagnosis Date  . Anxiety   . Depression     OCD  . CHF (congestive heart failure)     swelling of feet & ankles  . Shortness of breath     with exertion  . GERD (gastroesophageal reflux disease)     severe  . Asthma     had 1 episode 1 year ago-no more problem  . OCD (obsessive compulsive disorder)     Past Surgical History  Procedure Laterality Date  . Tubal ligation    . Tonsillectomy    . Carpal tunnel release  03/22/2012    Procedure: CARPAL TUNNEL RELEASE;  Surgeon: Nicki Reaper, MD;  Location: [facility];  Service: Orthopedics;  Laterality: Right;  right carpal tunnel release  . Colonoscopy  06/19/2012    Procedure: COLONOSCOPY;  Surgeon: Corbin Ade, MD;  Location[facility];  Service: Endoscopy;  Laterality: N/A;  8:30/Give Phenergan 12.5 mg IV 30 mins prior to procedure    Family History  Problem Relation Age of Onset  . Colon cancer Neg Hx     History  Substance Use Topics   . Smoking status: Never Smoker   . Smokeless tobacco: Never Used  . Alcohol Use: No    Review of Systems ROS: Statement: All systems negative except as marked or noted in the HPI; Constitutional: Negative for fever and chills. +generalized weakness/fatigue.; ; Eyes: Negative for eye pain, redness and discharge. ; ; ENMT: Negative for ear pain, hoarseness, nasal congestion, sinus pressure and sore throat. ; ; Cardiovascular: +SOB, CP. Negative for palpitations, diaphoresis, and peripheral edema. ; ; Respiratory: Negative for cough, wheezing and stridor. ; ; Gastrointestinal: +bloating, eructation, flatus. Negative for nausea, vomiting, diarrhea, abdominal pain, blood in stool, hematemesis, jaundice and rectal bleeding. . ; ; Genitourinary: Negative for dysuria, flank pain and hematuria. ; ; Musculoskeletal: Negative for back pain and neck pain. Negative for swelling and trauma.; ; Skin: Negative for pruritus, rash, abrasions, blisters, bruising and skin lesion.; ; Neuro: Negative for headache, lightheadedness and neck stiffness. Negative for altered level of consciousness , altered mental status, extremity weakness, paresthesias, involuntary movement, seizure and syncope.     Allergies  Ciprofloxacin; Codeine; and Sulfa antibiotics  Home Medications   Current Outpatient Rx  Name  Route  Sig  Dispense  Refill  . famotidine (PEPCID) 20 MG tablet   Oral   Take  20 mg by mouth daily.         . furosemide (LASIX) 40 MG tablet   Oral   Take 40 mg by mouth daily.         . Naproxen Sodium (ALEVE) 220 MG CAPS   Oral   Take 1 capsule by mouth daily as needed (arthritis pain).         . pantoprazole (PROTONIX) 40 MG tablet   Oral   Take 40 mg by mouth 2 (two) times daily.          Marland Kitchen PARoxetine (PAXIL) 20 MG tablet   Oral   Take 40 mg by mouth at bedtime.          . potassium chloride SA (K-DUR,KLOR-CON) 20 MEQ tablet   Oral   Take 20 mEq by mouth daily.           BP 122/75   Pulse 79  Temp(Src) 97.7 F (36.5 C) (Oral)  Resp 18  Ht 5\' 1"  (1.549 m)  Wt 188 lb (85.276 kg)  BMI 35.54 kg/m2  SpO2 97%  Physical Exam 1405: Physical examination:  Nursing notes reviewed; Vital signs and O2 SAT reviewed;  Constitutional: Well developed, Well nourished, Well hydrated, In no acute distress; Head:  Normocephalic, atraumatic; Eyes: EOMI, PERRL, No scleral icterus; ENMT: Mouth and pharynx normal, Mucous membranes moist; Neck: Supple, Full range of motion, No lymphadenopathy; Cardiovascular: Regular rate and rhythm, No murmur, rub, or gallop; Respiratory: Breath sounds clear & equal bilaterally, No rales, rhonchi, wheezes.  Speaking full sentences with ease, Normal respiratory effort/excursion; Chest: Nontender, Movement normal; Abdomen: Soft, Nontender, Nondistended, Normal bowel sounds; Genitourinary: No CVA tenderness; Extremities: Pulses normal, No tenderness, +tr pedal edema bilat. No calf edema or asymmetry.; Neuro: AA&Ox3, Major CN grossly intact.  No facial droop. Speech clear. Climbs on and off stretcher by herself easily. Gait steady. No gross focal motor or sensory deficits in extremities.; Skin: Color normal, Warm, Dry.; Psych:  Anxious.    ED Course  Procedures    MDM  MDM Reviewed: previous chart, nursing note and vitals Reviewed previous: labs and ECG Interpretation: labs, ECG and x-ray    Date: 10/20/2012  Rate: 62  Rhythm: normal sinus rhythm  QRS Axis: normal  Intervals: normal  ST/T Wave abnormalities: normal  Conduction Disutrbances:none  Narrative Interpretation:   Old EKG Reviewed: unchanged; no significant changes from previous EKG dated 09/07/2000.  Results for orders placed during the hospital encounter of 10/20/12  URINALYSIS, ROUTINE W REFLEX MICROSCOPIC      Result Value Range   Color, Urine YELLOW  YELLOW   APPearance CLEAR  CLEAR   Specific Gravity, Urine 1.015  1.005 - 1.030   pH 5.5  5.0 - 8.0   Glucose, UA NEGATIVE  NEGATIVE  mg/dL   Hgb urine dipstick TRACE (*) NEGATIVE   Bilirubin Urine NEGATIVE  NEGATIVE   Ketones, ur NEGATIVE  NEGATIVE mg/dL   Protein, ur NEGATIVE  NEGATIVE mg/dL   Urobilinogen, UA 0.2  0.0 - 1.0 mg/dL   Nitrite NEGATIVE  NEGATIVE   Leukocytes, UA NEGATIVE  NEGATIVE  CBC WITH DIFFERENTIAL      Result Value Range   WBC 4.2  4.0 - 10.5 K/uL   RBC 3.77 (*) 3.87 - 5.11 MIL/uL   Hemoglobin 12.5  12.0 - 15.0 g/dL   HCT 82.9  56.2 - 13.0 %   MCV 100.5 (*) 78.0 - 100.0 fL   MCH 33.2  26.0 - 34.0 pg  MCHC 33.0  30.0 - 36.0 g/dL   RDW 16.1  09.6 - 04.5 %   Platelets 158  150 - 400 K/uL   Neutrophils Relative 54  43 - 77 %   Neutro Abs 2.3  1.7 - 7.7 K/uL   Lymphocytes Relative 31  12 - 46 %   Lymphs Abs 1.3  0.7 - 4.0 K/uL   Monocytes Relative 13 (*) 3 - 12 %   Monocytes Absolute 0.6  0.1 - 1.0 K/uL   Eosinophils Relative 1  0 - 5 %   Eosinophils Absolute 0.1  0.0 - 0.7 K/uL   Basophils Relative 1  0 - 1 %   Basophils Absolute 0.0  0.0 - 0.1 K/uL  COMPREHENSIVE METABOLIC PANEL      Result Value Range   Sodium 139  135 - 145 mEq/L   Potassium 4.1  3.5 - 5.1 mEq/L   Chloride 101  96 - 112 mEq/L   CO2 30  19 - 32 mEq/L   Glucose, Bld 91  70 - 99 mg/dL   BUN 18  6 - 23 mg/dL   Creatinine, Ser 4.09  0.50 - 1.10 mg/dL   Calcium 9.3  8.4 - 81.1 mg/dL   Total Protein 7.0  6.0 - 8.3 g/dL   Albumin 3.9  3.5 - 5.2 g/dL   AST 22  0 - 37 U/L   ALT 20  0 - 35 U/L   Alkaline Phosphatase 80  39 - 117 U/L   Total Bilirubin 0.8  0.3 - 1.2 mg/dL   GFR calc non Af Amer 72 (*) >90 mL/min   GFR calc Af Amer 84 (*) >90 mL/min  LIPASE, BLOOD      Result Value Range   Lipase 38  11 - 59 U/L  TROPONIN I      Result Value Range   Troponin I <0.30  <0.30 ng/mL  PRO B NATRIURETIC PEPTIDE      Result Value Range   Pro B Natriuretic peptide (BNP) 35.0  0 - 125 pg/mL  D-DIMER, QUANTITATIVE      Result Value Range   D-Dimer, Quant <0.27  0.00 - 0.48 ug/mL-FEU  URINE MICROSCOPIC-ADD ON      Result  Value Range   Squamous Epithelial / LPF FEW (*) RARE   WBC, UA 0-2  <3 WBC/hpf   RBC / HPF 0-2  <3 RBC/hpf   Bacteria, UA RARE  RARE   Dg Abd Acute W/chest 10/20/2012  *RADIOLOGY REPORT*  Clinical Data: Chest pain and shortness of breath.  Bloating and weakness.  ACUTE ABDOMEN SERIES (ABDOMEN 2 VIEW & CHEST 1 VIEW)  Comparison: None.  Findings: The heart size is normal.  Mild interstitial coarsening is likely chronic.  There is slight rightward curvature in the mid thoracic spine.  Supine and upright views the abdomen demonstrate a nonspecific bowel gas pattern.  There is no evidence for obstruction or free air.  Mild stool is present throughout the colon.  Mild degenerative changes are noted in the lower lumbar spine.  IMPRESSION:  1.  No acute abnormality of the chest or abdomen. 2.  Mild interstitial coarsening of the lungs is likely chronic.   Original Report Authenticated By: Marin Roberts, M.D.      1645:  Pt not orthostatic.  Continues to deny CP or SOB while in the ED.  Has ambulated several times with steady gait, easy resps. TIMI 0. Describes very atypical symptoms for ACS; doubt same as  cause for symptoms given with normal troponin and unchanged EKG from previous. Doubt PE with normal d-dimer and low risk Well's.  Pt wants to go home now. Dx and testing d/w pt.  Questions answered.  Verb understanding, agreeable to d/c home with outpt f/u.            Laray Anger, DO 10/23/12 1433

## 2012-10-20 NOTE — ED Notes (Signed)
Feels bloated in abd for 3 days , Had pain in chest yesterday, and thinks it is "gas".  Feels weak, swollen and bloated.  No NVD.  Feels sob at times.

## 2012-10-20 NOTE — ED Notes (Signed)
Pt updated on status of labwork and diagnostics

## 2012-10-20 NOTE — ED Notes (Signed)
Discharge instructions reviewed with pt, questions answered. Pt verbalized understanding.  

## 2012-10-22 LAB — URINE CULTURE

## 2013-01-11 ENCOUNTER — Ambulatory Visit (INDEPENDENT_AMBULATORY_CARE_PROVIDER_SITE_OTHER): Payer: Medicare PPO | Admitting: Otolaryngology

## 2013-01-11 DIAGNOSIS — R070 Pain in throat: Secondary | ICD-10-CM

## 2013-01-11 DIAGNOSIS — K219 Gastro-esophageal reflux disease without esophagitis: Secondary | ICD-10-CM

## 2013-01-22 ENCOUNTER — Ambulatory Visit (HOSPITAL_COMMUNITY)
Admission: RE | Admit: 2013-01-22 | Discharge: 2013-01-22 | Disposition: A | Discharge status: Home / Self Care | Payer: Medicare PPO | Source: Ambulatory Visit | Attending: Pulmonary Disease | Admitting: Pulmonary Disease

## 2013-01-22 ENCOUNTER — Other Ambulatory Visit (HOSPITAL_COMMUNITY): Payer: Self-pay | Admitting: Pulmonary Disease

## 2013-01-22 DIAGNOSIS — M25539 Pain in unspecified wrist: Secondary | ICD-10-CM | POA: Insufficient documentation

## 2013-01-22 DIAGNOSIS — M79609 Pain in unspecified limb: Secondary | ICD-10-CM | POA: Insufficient documentation

## 2013-01-22 DIAGNOSIS — W19XXXA Unspecified fall, initial encounter: Secondary | ICD-10-CM

## 2013-01-22 DIAGNOSIS — S6990XA Unspecified injury of unspecified wrist, hand and finger(s), initial encounter: Secondary | ICD-10-CM | POA: Insufficient documentation

## 2013-01-22 DIAGNOSIS — S59909A Unspecified injury of unspecified elbow, initial encounter: Secondary | ICD-10-CM | POA: Insufficient documentation

## 2013-02-15 ENCOUNTER — Ambulatory Visit (INDEPENDENT_AMBULATORY_CARE_PROVIDER_SITE_OTHER): Payer: Medicare PPO | Admitting: Otolaryngology

## 2013-02-15 DIAGNOSIS — K219 Gastro-esophageal reflux disease without esophagitis: Secondary | ICD-10-CM

## 2013-04-19 ENCOUNTER — Ambulatory Visit (INDEPENDENT_AMBULATORY_CARE_PROVIDER_SITE_OTHER): Payer: Medicare PPO | Admitting: Otolaryngology

## 2013-08-23 ENCOUNTER — Ambulatory Visit (INDEPENDENT_AMBULATORY_CARE_PROVIDER_SITE_OTHER): Payer: Medicare PPO | Admitting: Otolaryngology

## 2013-08-28 ENCOUNTER — Other Ambulatory Visit (HOSPITAL_COMMUNITY): Payer: Self-pay | Admitting: Internal Medicine

## 2013-08-28 DIAGNOSIS — Z139 Encounter for screening, unspecified: Secondary | ICD-10-CM

## 2013-09-20 ENCOUNTER — Ambulatory Visit (INDEPENDENT_AMBULATORY_CARE_PROVIDER_SITE_OTHER): Payer: Medicare PPO | Admitting: Otolaryngology

## 2013-09-20 DIAGNOSIS — R490 Dysphonia: Secondary | ICD-10-CM

## 2013-09-20 DIAGNOSIS — K219 Gastro-esophageal reflux disease without esophagitis: Secondary | ICD-10-CM

## 2013-09-25 ENCOUNTER — Ambulatory Visit (HOSPITAL_COMMUNITY)
Admission: RE | Admit: 2013-09-25 | Discharge: 2013-09-25 | Disposition: A | Discharge status: Home / Self Care | Payer: Medicare PPO | Source: Ambulatory Visit | Attending: Internal Medicine | Admitting: Internal Medicine

## 2013-09-25 DIAGNOSIS — Z139 Encounter for screening, unspecified: Secondary | ICD-10-CM

## 2013-09-25 DIAGNOSIS — Z1231 Encounter for screening mammogram for malignant neoplasm of breast: Secondary | ICD-10-CM | POA: Insufficient documentation

## 2013-12-19 ENCOUNTER — Ambulatory Visit (INDEPENDENT_AMBULATORY_CARE_PROVIDER_SITE_OTHER)
Admission: RE | Admit: 2013-12-19 | Discharge: 2013-12-19 | Disposition: A | Discharge status: Home / Self Care | Payer: Medicare PPO | Source: Ambulatory Visit | Attending: Critical Care Medicine | Admitting: Critical Care Medicine

## 2013-12-19 ENCOUNTER — Encounter: Payer: Self-pay | Admitting: Critical Care Medicine

## 2013-12-19 ENCOUNTER — Ambulatory Visit (INDEPENDENT_AMBULATORY_CARE_PROVIDER_SITE_OTHER): Payer: Medicare PPO | Admitting: Critical Care Medicine

## 2013-12-19 DIAGNOSIS — F419 Anxiety disorder, unspecified: Secondary | ICD-10-CM | POA: Insufficient documentation

## 2013-12-19 DIAGNOSIS — R059 Cough, unspecified: Secondary | ICD-10-CM

## 2013-12-19 DIAGNOSIS — I509 Heart failure, unspecified: Secondary | ICD-10-CM | POA: Insufficient documentation

## 2013-12-19 DIAGNOSIS — R053 Chronic cough: Secondary | ICD-10-CM | POA: Insufficient documentation

## 2013-12-19 DIAGNOSIS — R05 Cough: Secondary | ICD-10-CM

## 2013-12-19 DIAGNOSIS — J309 Allergic rhinitis, unspecified: Secondary | ICD-10-CM

## 2013-12-19 DIAGNOSIS — F329 Major depressive disorder, single episode, unspecified: Secondary | ICD-10-CM | POA: Insufficient documentation

## 2013-12-19 DIAGNOSIS — F32A Depression, unspecified: Secondary | ICD-10-CM | POA: Insufficient documentation

## 2013-12-19 MED ORDER — PREDNISONE 10 MG PO TABS: ORAL_TABLET | ORAL | Status: DC

## 2013-12-19 MED ORDER — HYDROCODONE-HOMATROPINE 5-1.5 MG/5ML PO SYRP: ORAL_SOLUTION | ORAL | Status: DC

## 2013-12-19 MED ORDER — FLUTICASONE PROPIONATE 50 MCG/ACT NA SUSP: 2.0000 | Freq: Two times a day (BID) | NASAL | Status: DC

## 2013-12-19 MED ORDER — BENZONATATE 200 MG PO CAPS: ORAL_CAPSULE | ORAL | Status: DC

## 2013-12-19 NOTE — Patient Instructions (Signed)
Start cough protocol with hycodan/benzonatate Increase flonase two puff ea nostril twice a day Stay on Xyzal daily Prednisone 10mg  Take 4 for three days 3 for three days 2 for three days 1 for three days and stop Chest xray today Pepcid at bedtime Protonix 1/2 hour before meal twice daily Strict reflux diet Return 6 weeks with spirometry

## 2013-12-19 NOTE — Progress Notes (Signed)
Subjective:    Patient ID: [name], female    DOB: 1950/08/04, 63 y.o.   MRN: [id number]  HPI Comments: Chronic cough x 89yrs.  Pt was working in [employer], had a cough then and rx inhaler.  Overtime, got some better but now worse and is chronic. Noting this for three years.   Cough This is a chronic problem. The current episode started more than 1 year ago. The problem has been gradually worsening. The problem occurs every few hours (can be paroxysmal, no particular time of day). The cough is productive of sputum (mucus is clear). Associated symptoms include heartburn, postnasal drip, rhinorrhea and a sore throat. Pertinent negatives include no chest pain, chills, ear congestion, ear pain, fever, headaches, hemoptysis, myalgias, nasal congestion, rash, shortness of breath, sweats, weight loss or wheezing. Associated symptoms comments: occ hoarse Some dysphagia noted, dx GERD and on Rx Notes a lot of belching and burping. The symptoms are aggravated by fumes, dust, lying down and exercise. Risk factors for lung disease include occupational exposure. She has tried oral steroids, a beta-agonist inhaler and prescription cough suppressant for the symptoms. The treatment provided no relief. Her past medical history is significant for bronchitis. There is no history of asthma, bronchiectasis, COPD, emphysema, environmental allergies or pneumonia. pt has seen sharma: skin testing: only pos was mold mix all others NEG on 11/26/13   Past Medical History  Diagnosis Date  . Anxiety   . Depression     OCD  . CHF (congestive heart failure)     swelling of feet & ankles  . Shortness of breath     with exertion  . GERD (gastroesophageal reflux disease)     severe  . Asthma     had 1 episode 1 year ago-no more problem  . OCD (obsessive compulsive disorder)   . Cough      Family History  Problem Relation Age of Onset  . Colon cancer Neg Hx   . Asthma Mother      History   Social  History  . Marital Status: Married    Spouse Name: N/A    Number of Children: 1  . Years of Education: N/A   Occupational History  . retired    Social History Main Topics  . Smoking status: Never Smoker   . Smokeless tobacco: Never Used  . Alcohol Use: No  . Drug Use: No  . Sexual Activity: Yes    Birth Control/ Protection: Surgical   Other Topics Concern  . Not on file   Social History Narrative  . No narrative on file     Allergies  Allergen Reactions  . Ciprofloxacin Other (See Comments)    Jaundice  Recently took course of cipro without difficulty  . Codeine Other (See Comments)    Unknown-pt is not sure of reaction  . Sulfa Antibiotics Other (See Comments)    Unknown- pt unsure of reaction; believes it may be nausea     Outpatient Prescriptions Prior to Visit  Medication Sig Dispense Refill  . famotidine (PEPCID) 20 MG tablet Take 20 mg by mouth daily.      . Naproxen Sodium (ALEVE) 220 MG CAPS Take 1 capsule by mouth daily as needed (arthritis pain).      . pantoprazole (PROTONIX) 40 MG tablet Take 40 mg by mouth 2 (two) times daily.       . furosemide (LASIX) 40 MG tablet Take 40 mg by mouth daily.      Marland Kitchen  PARoxetine (PAXIL) 20 MG tablet Take 40 mg by mouth at bedtime.       . potassium chloride SA (K-DUR,KLOR-CON) 20 MEQ tablet Take 20 mEq by mouth daily.       No facility-administered medications prior to visit.     Review of Systems  Constitutional: Negative for fever, chills, weight loss, diaphoresis, activity change, appetite change, fatigue and unexpected weight change.  HENT: Positive for postnasal drip, rhinorrhea, sore throat, trouble swallowing and voice change. Negative for congestion, dental problem, ear discharge, ear pain, facial swelling, hearing loss, mouth sores, nosebleeds, sinus pressure, sneezing and tinnitus.   Eyes: Negative for photophobia, discharge, itching and visual disturbance.  Respiratory: Positive for cough. Negative for apnea,  hemoptysis, choking, chest tightness, shortness of breath, wheezing and stridor.   Cardiovascular: Negative for chest pain, palpitations and leg swelling.  Gastrointestinal: Positive for heartburn. Negative for nausea, vomiting, abdominal pain, constipation, blood in stool and abdominal distention.  Genitourinary: Negative for dysuria, urgency, frequency, hematuria, flank pain, decreased urine volume and difficulty urinating.  Musculoskeletal: Negative for arthralgias, back pain, gait problem, joint swelling, myalgias, neck pain and neck stiffness.  Skin: Negative for color change, pallor and rash.  Allergic/Immunologic: Negative for environmental allergies.  Neurological: Negative for dizziness, tremors, seizures, syncope, speech difficulty, weakness, light-headedness, numbness and headaches.  Hematological: Negative for adenopathy. Bruises/bleeds easily.  Psychiatric/Behavioral: Negative for confusion, sleep disturbance and agitation. The patient is nervous/anxious.        Objective:   Physical Exam  There were no vitals filed for this visit.  Gen: Pleasant, well-nourished, in no distress,  normal affect  ENT: No lesions,  mouth clear,  oropharynx clear, no postnasal drip, erythema in nares, no purulence  Neck: No JVD, no TMG, no carotid bruits  Lungs: No use of accessory muscles, no dullness to percussion, pseudowheeze   Cardiovascular: RRR, heart sounds normal, no murmur or gallops, no peripheral edema  Abdomen: soft and NT, no HSM,  BS normal  Musculoskeletal: No deformities, no cyanosis or clubbing  Neuro: alert, non focal  Skin: Warm, no lesions or rashes  Dg Chest 2 View  12/19/2013   CLINICAL DATA:  Chronic cough for 3 years, history of asthma  EXAM: CHEST  2 VIEW  COMPARISON:  Chest x-ray of 10/20/2012  FINDINGS: No active infiltrate or effusion is seen. There is some peribronchial thickening which may indicate bronchitis. Mediastinal contours appear normal. The heart is  mildly enlarged and stable. No acute bony abnormality is seen.  IMPRESSION: No active lung disease. Peribronchial thickening may indicate bronchitis.   Electronically Signed   By: Ivar Drape M.D.   On: 12/19/2013 13:15         Assessment & Plan:   Cough Cyclical cough on basis of lower airway inflammation c/w asthma, GERD, allergic rhinitis with post nasal drip syndrome Plan Start cough protocol with hycodan/benzonatate Increase flonase two puff ea nostril twice a day Stay on Xyzal daily Prednisone 10mg  Take 4 for three days 3 for three days 2 for three days 1 for three days and stop Pepcid at bedtime Protonix 1/2 hour before meal twice daily Strict reflux diet Return 6 weeks with spirometry     Updated Medication List Outpatient Encounter Prescriptions as of 12/19/2013  Medication Sig  . famotidine (PEPCID) 20 MG tablet Take 20 mg by mouth daily.  Marland Kitchen FLUoxetine (PROZAC) 20 MG capsule Take 1 capsule by mouth daily.  . fluticasone (FLONASE) 50 MCG/ACT nasal spray Place 2 sprays into  both nostrils 2 (two) times daily.  Marland Kitchen levocetirizine (XYZAL) 5 MG tablet Take 1 tablet by mouth daily.  . Naproxen Sodium (ALEVE) 220 MG CAPS Take 1 capsule by mouth daily as needed (arthritis pain).  . pantoprazole (PROTONIX) 40 MG tablet Take 40 mg by mouth 2 (two) times daily.   Marland Kitchen PROAIR HFA 108 (90 BASE) MCG/ACT inhaler Inhale 1-2 puffs into the lungs every 4 (four) hours as needed. For cough or wheezing  . Spacer/Aero-Holding Chambers (AEROCHAMBER PLUS FLO-VU) MISC Use as directed with proair  . TOVIAZ 4 MG TB24 tablet Take 1 tablet by mouth daily as needed.  . [DISCONTINUED] fluticasone (FLONASE) 50 MCG/ACT nasal spray Place 1-2 sprays into both nostrils daily.  . benzonatate (TESSALON) 200 MG capsule Take one every 6 hours per cough protocol  . HYDROcodone-homatropine (HYCODAN) 5-1.5 MG/5ML syrup Take per cough protocol  . predniSONE (DELTASONE) 10 MG tablet Take 4 for three days 3 for three days  2 for three days 1 for three days and stop  . [DISCONTINUED] furosemide (LASIX) 40 MG tablet Take 40 mg by mouth daily.  . [DISCONTINUED] PARoxetine (PAXIL) 20 MG tablet Take 40 mg by mouth at bedtime.   . [DISCONTINUED] potassium chloride SA (K-DUR,KLOR-CON) 20 MEQ tablet Take 20 mEq by mouth daily.

## 2013-12-20 NOTE — Assessment & Plan Note (Signed)
Cyclical cough on basis of lower airway inflammation c/w asthma, GERD, allergic rhinitis with post nasal drip syndrome Plan Start cough protocol with hycodan/benzonatate Increase flonase two puff ea nostril twice a day Stay on Xyzal daily Prednisone 10mg  Take 4 for three days 3 for three days 2 for three days 1 for three days and stop Pepcid at bedtime Protonix 1/2 hour before meal twice daily Strict reflux diet Return 6 weeks with spirometry

## 2013-12-20 NOTE — Progress Notes (Signed)
Quick Note:  ATC pt's home and cell #s x 3. Each time both lines are busy. WCB ______

## 2013-12-21 NOTE — Progress Notes (Signed)
Quick Note:  Called, spoke with pt. Informed her of cxr results and recs per Dr. Wright. She verbalized understanding and voiced no further questions or concerns at this time. ______ 

## 2013-12-24 ENCOUNTER — Telehealth: Payer: Self-pay | Admitting: Critical Care Medicine

## 2013-12-24 NOTE — Telephone Encounter (Signed)
lmomtcb for pt 

## 2013-12-24 NOTE — Telephone Encounter (Signed)
I called made pt aware of recs. Nothing further needed 

## 2013-12-24 NOTE — Telephone Encounter (Signed)
Pt states that she is using sugar free candies for her cough as directed Pt c/o increased amts of bloating and gas. Pt states that all sugar free foods do this to her and she is requesting any other recommendations that can be given. Pt wanting to know if there are any other types that can be recommended that will not be "bad for her" Pt states that she does not want to do anything to mess up her progress she has made with the cough.  Please advise Dr Joya Gaskins. Thanks.

## 2013-12-24 NOTE — Telephone Encounter (Signed)
Per OV 12/19/13: Patient Instructions      Start cough protocol with hycodan/benzonatate Increase flonase two puff ea nostril twice a day Stay on Xyzal daily Prednisone 10mg  Take 4 for three days 3 for three days 2 for three days 1 for three days and stop Chest xray today Pepcid at bedtime Protonix 1/2 hour before meal twice daily Strict reflux diet Return 6 weeks with spirometry    --  Called spoke with pt. She reports [facility] advised her they no longer have tessalon pearls. She was told this was being d/c by manufacturer and was told to call for alternative. Please advise PW thanks  Allergies  Allergen Reactions  . Ciprofloxacin Other (See Comments)    Jaundice  Recently took course of cipro without difficulty  . Codeine Other (See Comments)    Unknown-pt is not sure of reaction  . Sulfa Antibiotics Other (See Comments)    Unknown- pt unsure of reaction; believes it may be nausea

## 2013-12-24 NOTE — Telephone Encounter (Signed)
It is still available widely under generic : benzonatate.  Look elsewhere

## 2013-12-24 NOTE — Telephone Encounter (Signed)
Try ludens throat lozenges.  Just has pectin only

## 2013-12-25 NOTE — Telephone Encounter (Signed)
Can try Delsym OTC and use as needed   5ML 2-3x daily

## 2013-12-25 NOTE — Telephone Encounter (Signed)
Please call the patient back [phone]

## 2013-12-25 NOTE — Telephone Encounter (Signed)
lmomtcb x1 

## 2013-12-25 NOTE — Telephone Encounter (Signed)
Spoke with the pt  She states she can also go to the [facility] in [place] for rx  I called and spoke with the pharmacist there, who states that benzonatate is on manufacture backorder until at least August  He states very unlikely we can find this now  Please advise on alternative thanks!

## 2013-12-26 NOTE — Telephone Encounter (Signed)
Pt advised. Jennifer Castillo, CMA  

## 2014-01-30 ENCOUNTER — Encounter: Payer: Self-pay | Admitting: Critical Care Medicine

## 2014-01-30 ENCOUNTER — Ambulatory Visit (INDEPENDENT_AMBULATORY_CARE_PROVIDER_SITE_OTHER): Payer: Medicare PPO | Admitting: Critical Care Medicine

## 2014-01-30 VITALS — BP 140/94 | HR 101 | Ht 62.0 in | Wt 188.8 lb

## 2014-01-30 DIAGNOSIS — R05 Cough: Secondary | ICD-10-CM

## 2014-01-30 DIAGNOSIS — R059 Cough, unspecified: Secondary | ICD-10-CM

## 2014-01-30 MED ORDER — CHLORPHENIRAMINE MALEATE 4 MG PO TABS: ORAL_TABLET | ORAL | Status: DC

## 2014-01-30 MED ORDER — TRAMADOL HCL 50 MG PO TABS: 50.0000 mg | ORAL_TABLET | Freq: Four times a day (QID) | ORAL | Status: DC | PRN

## 2014-01-30 NOTE — Progress Notes (Signed)
Subjective:    Patient ID: [name], female    DOB: 27-Oct-1950, 63 y.o.   MRN: [id number]  HPI Comments: Chronic cough x 29yrs.  Pt was working in [employer], had a cough then and rx inhaler.  Overtime, got some better but now worse and is chronic. Noting this for three years.   01/30/2014 Chief Complaint  Patient presents with  . 6 wk follow up    Still coughing but has improved.  Cough is prod with clear mucus.  No SOB, wheezing, or chest tightness/pain.   Cough did get better.  . Still on ppi and H2. On a reflux diet.   Less dyspnea. No mucus. No chest pain Hx of singing and excess vocal use. Pt denies any significant sore throat, nasal congestion or excess secretions, fever, chills, sweats, unintended weight loss, pleurtic or exertional chest pain, orthopnea PND, or leg swelling Pt denies any increase in rescue therapy over baseline, denies waking up needing it or having any early am or nocturnal exacerbations of coughing/wheezing/or dyspnea. Pt also denies any obvious fluctuation in symptoms with  weather or environmental change or other alleviating or aggravating factors     Review of Systems  Constitutional: Negative for diaphoresis, activity change, appetite change, fatigue and unexpected weight change.  HENT: Positive for trouble swallowing and voice change. Negative for congestion, dental problem, ear discharge, facial swelling, hearing loss, mouth sores, nosebleeds, sinus pressure, sneezing and tinnitus.   Eyes: Negative for photophobia, discharge, itching and visual disturbance.  Respiratory: Negative for apnea, choking, chest tightness and stridor.   Cardiovascular: Negative for palpitations and leg swelling.  Gastrointestinal: Negative for nausea, vomiting, abdominal pain, constipation, blood in stool and abdominal distention.  Genitourinary: Negative for dysuria, urgency, frequency, hematuria, flank pain, decreased urine volume and difficulty urinating.    Musculoskeletal: Negative for arthralgias, back pain, gait problem, joint swelling, neck pain and neck stiffness.  Skin: Negative for color change and pallor.  Neurological: Negative for dizziness, tremors, seizures, syncope, speech difficulty, weakness, light-headedness and numbness.  Hematological: Negative for adenopathy. Bruises/bleeds easily.  Psychiatric/Behavioral: Negative for confusion, sleep disturbance and agitation. The patient is nervous/anxious.        Objective:   Physical Exam   Filed Vitals:   01/30/14 1014  BP: 140/94  Pulse: 101  Height: 5\' 2"  (1.575 m)  Weight: 188 lb 12.8 oz (85.639 kg)  SpO2: 98%    Gen: Pleasant, well-nourished, in no distress,  normal affect  ENT: No lesions,  mouth clear,  oropharynx clear, no postnasal drip, erythema in nares, no purulence  Neck: No JVD, no TMG, no carotid bruits  Lungs: No use of accessory muscles, no dullness to percussion,less  pseudowheeze   Cardiovascular: RRR, heart sounds normal, no murmur or gallops, no peripheral edema  Abdomen: soft and NT, no HSM,  BS normal  Musculoskeletal: No deformities, no cyanosis or clubbing  Neuro: alert, non focal  Skin: Warm, no lesions or rashes  No results found.       Assessment & Plan:   Cough Cyclical cough without primary lung disease d/t GERD, post nasal drip, vocal cord dysfunction, upper airway instability, allergic rhinitis Plan Stay on zyrtec and flonase Stay on protonix and pepcid Use tramadol /ultram 50mg  as needed for cough suppression in place of tessalon Use delsym as needed Continue ludens throat lozenge Chlorpheniramine 4mg  : 3 at bedtime No singing Voice rest 3 more days Hycodan cough syrup as needed Reflux diet and weight loss  program Return 2 months  moderate obesity. Dietary changes advised   Updated Medication List Outpatient Encounter Prescriptions as of 01/30/2014  Medication Sig  . dextromethorphan (DELSYM) 30 MG/5ML liquid  Take by mouth as needed for cough.  . famotidine (PEPCID) 20 MG tablet Take 20 mg by mouth daily.  Marland Kitchen FLUoxetine (PROZAC) 20 MG capsule Take 1 capsule by mouth daily.  . fluticasone (FLONASE) 50 MCG/ACT nasal spray Place 2 sprays into both nostrils 2 (two) times daily.  Marland Kitchen HYDROcodone-homatropine (HYCODAN) 5-1.5 MG/5ML syrup Take per cough protocol  . levocetirizine (XYZAL) 5 MG tablet Take 1 tablet by mouth daily.  . Naproxen Sodium (ALEVE) 220 MG CAPS Take 1 capsule by mouth daily as needed (arthritis pain).  . pantoprazole (PROTONIX) 40 MG tablet Take 40 mg by mouth 2 (two) times daily.   Marland Kitchen Spacer/Aero-Holding Chambers (AEROCHAMBER PLUS FLO-VU) MISC Use as directed with proair  . TOVIAZ 4 MG TB24 tablet Take 1 tablet by mouth daily as needed.  . chlorpheniramine (CHLOR-TRIMETON) 4 MG tablet 12mg  at bedtime  . traMADol (ULTRAM) 50 MG tablet Take 1 tablet (50 mg total) by mouth every 6 (six) hours as needed. For cough  . [DISCONTINUED] benzonatate (TESSALON) 200 MG capsule Take one every 6 hours per cough protocol  . [DISCONTINUED] predniSONE (DELTASONE) 10 MG tablet Take 4 for three days 3 for three days 2 for three days 1 for three days and stop  . [DISCONTINUED] PROAIR HFA 108 (90 BASE) MCG/ACT inhaler Inhale 1-2 puffs into the lungs every 4 (four) hours as needed. For cough or wheezing

## 2014-01-30 NOTE — Patient Instructions (Signed)
Stay on zyrtec and flonase Stay on protonix and pepcid Use tramadol /ultram 50mg  as needed for cough suppression in place of tessalon Use delsym as needed Continue ludens throat lozenge Chlorpheniramine 4mg  : 3 at bedtime No singing Voice rest 3 more days Hycodan cough syrup as needed Reflux diet and weight loss program Return 2 months

## 2014-01-31 DIAGNOSIS — E669 Obesity, unspecified: Secondary | ICD-10-CM | POA: Insufficient documentation

## 2014-01-31 NOTE — Assessment & Plan Note (Signed)
Cyclical cough without primary lung disease d/t GERD, post nasal drip, vocal cord dysfunction, upper airway instability, allergic rhinitis Plan Stay on zyrtec and flonase Stay on protonix and pepcid Use tramadol /ultram 50mg  as needed for cough suppression in place of tessalon Use delsym as needed Continue ludens throat lozenge Chlorpheniramine 4mg  : 3 at bedtime No singing Voice rest 3 more days Hycodan cough syrup as needed Reflux diet and weight loss program Return 2 months

## 2014-04-04 ENCOUNTER — Telehealth: Payer: Self-pay | Admitting: Critical Care Medicine

## 2014-04-04 MED ORDER — TRAMADOL HCL 50 MG PO TABS: 50.0000 mg | ORAL_TABLET | Freq: Four times a day (QID) | ORAL | Status: DC | PRN

## 2014-04-04 NOTE — Telephone Encounter (Signed)
i am ok with this order 

## 2014-04-04 NOTE — Telephone Encounter (Signed)
PW, please advise if okay to refill tramadol  Last had this filled # 60 with 1 rf on 01/30/14 Next ov 04/10/14 Thanks!

## 2014-04-04 NOTE — Telephone Encounter (Signed)
Rx was refilled  Pt aware  Nothing further needed 

## 2014-04-08 ENCOUNTER — Ambulatory Visit: Payer: Medicare PPO | Admitting: Critical Care Medicine

## 2014-04-09 ENCOUNTER — Encounter: Payer: Self-pay | Admitting: Adult Health

## 2014-04-09 ENCOUNTER — Ambulatory Visit (INDEPENDENT_AMBULATORY_CARE_PROVIDER_SITE_OTHER): Payer: Medicare PPO | Admitting: Adult Health

## 2014-04-09 VITALS — BP 120/82 | Ht 61.0 in | Wt 183.5 lb

## 2014-04-09 DIAGNOSIS — B379 Candidiasis, unspecified: Secondary | ICD-10-CM

## 2014-04-09 HISTORY — DX: Candidiasis, unspecified: B37.9

## 2014-04-09 LAB — POCT WET PREP (WET MOUNT)

## 2014-04-09 MED ORDER — FLUCONAZOLE 100 MG PO TABS: ORAL_TABLET | ORAL | Status: DC

## 2014-04-09 MED ORDER — NYSTATIN 100000 UNIT/GM EX POWD: CUTANEOUS | Status: DC

## 2014-04-09 NOTE — Patient Instructions (Signed)
Yeast Infection of the Skin Some yeast on the skin is normal, but sometimes it causes an infection. If you have a yeast infection, it shows up as white or light brown patches on brown skin. You can see it better in the summer on tan skin. It causes light-colored holes in your suntan. It can happen on any area of the body. This cannot be passed from person to person. HOME CARE  Scrub your skin daily with a dandruff shampoo. Your rash may take a couple weeks to get well.  Do not scratch or itch the rash. GET HELP RIGHT AWAY IF:   You get another infection from scratching. The skin may get warm, red, and may ooze fluid.  The infection does not seem to be getting better. MAKE SURE YOU:  Understand these instructions.  Will watch your condition.  Will get help right away if you are not doing well or get worse. Document Released: 06/17/2008 Document Revised: 09/27/2011 Document Reviewed: 06/17/2008 [facility] Patient Information 2015 [place], [place]. This information is not intended to replace advice given to you by your health care provider. Make sure you discuss any questions you have with your health care provider. Monilial Vaginitis Vaginitis in a soreness, swelling and redness (inflammation) of the vagina and vulva. Monilial vaginitis is not a sexually transmitted infection. CAUSES  Yeast vaginitis is caused by yeast (candida) that is normally found in your vagina. With a yeast infection, the candida has overgrown in number to a point that upsets the chemical balance. SYMPTOMS   White, thick vaginal discharge.  Swelling, itching, redness and irritation of the vagina and possibly the lips of the vagina (vulva).  Burning or painful urination.  Painful intercourse. DIAGNOSIS  Things that may contribute to monilial vaginitis are:  Postmenopausal and virginal states.  Pregnancy.  Infections.  Being tired, sick or stressed, especially if you had monilial vaginitis in the  past.  Diabetes. Good control will help lower the chance.  Birth control pills.  Tight fitting garments.  Using bubble bath, feminine sprays, douches or deodorant tampons.  Taking certain medications that kill germs (antibiotics).  Sporadic recurrence can occur if you become ill. TREATMENT  Your caregiver will give you medication.  There are several kinds of anti monilial vaginal creams and suppositories specific for monilial vaginitis. For recurrent yeast infections, use a suppository or cream in the vagina 2 times a week, or as directed.  Anti-monilial or steroid cream for the itching or irritation of the vulva may also be used. Get your caregiver's permission.  Painting the vagina with methylene blue solution may help if the monilial cream does not work.  Eating yogurt may help prevent monilial vaginitis. HOME CARE INSTRUCTIONS   Finish all medication as prescribed.  Do not have sex until treatment is completed or after your caregiver tells you it is okay.  Take warm sitz baths.  Do not douche.  Do not use tampons, especially scented ones.  Wear cotton underwear.  Avoid tight pants and panty hose.  Tell your sexual partner that you have a yeast infection. They should go to their caregiver if they have symptoms such as mild rash or itching.  Your sexual partner should be treated as well if your infection is difficult to eliminate.  Practice safer sex. Use condoms.  Some vaginal medications cause latex condoms to fail. Vaginal medications that harm condoms are:  Cleocin cream.  Butoconazole (Femstat).  Terconazole (Terazol) vaginal suppository.  Miconazole (Monistat) (may be purchased over the  counter). SEEK MEDICAL CARE IF:   You have a temperature by mouth above 102 F (38.9 C).  The infection is getting worse after 2 days of treatment.  The infection is not getting better after 3 days of treatment.  You develop blisters in or around your  vagina.  You develop vaginal bleeding, and it is not your menstrual period.  You have pain when you urinate.  You develop intestinal problems.  You have pain with sexual intercourse. Document Released: 04/14/2005 Document Revised: 09/27/2011 Document Reviewed: 12/27/2008 [facility] Patient Information 2015 [place], [place]. This information is not intended to replace advice given to you by your health care provider. Make sure you discuss any questions you have with your health care provider. Use nystatin powder Take diflucan daily x 7 days Follow up prn

## 2014-04-09 NOTE — Progress Notes (Signed)
Subjective:     Patient ID: [name], female   DOB: 04/24/1951, 63 y.o.   MRN: [id number]  HPI [name] is a 63 year old white female, in complaining of recurrent yeast, with burning and itching, has had thrush too.Denies any diabetes, has had antibiotics for UTI and has tried monistat with out relief.  Review of Systems See HPI Reviewed past medical,surgical, social and family history. Reviewed medications and allergies.     Objective:   Physical Exam BP 120/82  Ht 5\' 1"  (1.549 m)  Wt 183 lb 8 oz (83.235 kg)  BMI 34.69 kg/m2   Skin warm and dry.Pelvic: external genitalia is irritated and red inappearance, no lesions seen, vagina: scant discharge without odor, cervix:smooth and atrophic, uterus: normal size, shape and contour, non tender, no masses felt, adnexa: no masses or tenderness noted. Wet prep: + yeast Has skin fungus under panniculus.  Assessment:     Yeast infection    Plan:     Rx diflucan 100 mg take 1 daily x 7 days no refills Rx nystatin powder use 2-3 x daily to affected area with 2 refills Follow up prn  Review handout on yeast infection on skin and vagina

## 2014-04-10 ENCOUNTER — Encounter: Payer: Self-pay | Admitting: Critical Care Medicine

## 2014-04-10 ENCOUNTER — Ambulatory Visit (INDEPENDENT_AMBULATORY_CARE_PROVIDER_SITE_OTHER): Payer: Medicare PPO | Admitting: Critical Care Medicine

## 2014-04-10 VITALS — BP 110/60 | HR 67 | Temp 97.0°F | Ht 62.0 in | Wt 183.2 lb

## 2014-04-10 DIAGNOSIS — R05 Cough: Secondary | ICD-10-CM

## 2014-04-10 DIAGNOSIS — Z23 Encounter for immunization: Secondary | ICD-10-CM

## 2014-04-10 DIAGNOSIS — R059 Cough, unspecified: Secondary | ICD-10-CM

## 2014-04-10 MED ORDER — FAMOTIDINE 20 MG PO TABS: 20.0000 mg | ORAL_TABLET | Freq: Every day | ORAL | Status: DC

## 2014-04-10 MED ORDER — FLUTICASONE PROPIONATE 50 MCG/ACT NA SUSP: 2.0000 | Freq: Two times a day (BID) | NASAL | Status: DC

## 2014-04-10 NOTE — Progress Notes (Signed)
Subjective:    Patient ID: [name], female    DOB: [birth year], 63 y.o.   MRN: [id number]  HPI 04/10/2014 Chief Complaint  Patient presents with  . Follow-up    coughing productive clear  Overall cough is better.  Notes pndrip. Mucus is clear.  Pt remains on her program. Still uses ludens throat lozenge.  Not singing.   Pt denies any significant sore throat, nasal congestion or excess secretions, fever, chills, sweats, unintended weight loss, pleurtic or exertional chest pain, orthopnea PND, or leg swelling Pt denies any increase in rescue therapy over baseline, denies waking up needing it or having any early am or nocturnal exacerbations of coughing/wheezing/or dyspnea. Pt also denies any obvious fluctuation in symptoms with  weather or environmental change or other alleviating or aggravating factors     Review of Systems Constitutional:   No  weight loss, night sweats,  Fevers, chills, fatigue, lassitude. HEENT:   No headaches,  Difficulty swallowing,  Tooth/dental problems,  Sore throat,                No sneezing, itching, ear ache, nasal congestion, +++post nasal drip,   CV:  No chest pain,  Orthopnea, PND, swelling in lower extremities, anasarca, dizziness, palpitations  GI  No heartburn, indigestion, abdominal pain, nausea, vomiting, diarrhea, change in bowel habits, loss of appetite  Resp: No shortness of breath with exertion or at rest.  No excess mucus, no productive cough,  Notes non-productive cough,  No coughing up of blood.  No change in color of mucus.  No wheezing.  No chest wall deformity  Skin: no rash or lesions.  GU: no dysuria, change in color of urine, no urgency or frequency.  No flank pain.  MS:  No joint pain or swelling.  No decreased range of motion.  No back pain.  Psych:  No change in mood or affect. No depression or anxiety.  No memory loss.     Objective:   Physical Exam There were no vitals filed for this visit.  Gen: Pleasant,  well-nourished, in no distress,  normal affect  ENT: No lesions,  mouth clear,  oropharynx clear, mild postnasal drip  Neck: No JVD, no TMG, no carotid bruits  Lungs: No use of accessory muscles, no dullness to percussion, clear without rales or rhonchi, less prominent pseudowheeze  Cardiovascular: RRR, heart sounds normal, no murmur or gallops, no peripheral edema  Abdomen: soft and NT, no HSM,  BS normal  Musculoskeletal: No deformities, no cyanosis or clubbing  Neuro: alert, non focal  Skin: Warm, no lesions or rashes  No results found.        Assessment & Plan:   Cough Cyclical cough without primary lung disease. Ppt factor VCD, upper airway instability, allergic rhintis, GERD Overall improved Plan Take pepcid at bedtime No other changes Flu vaccine was given Return 4 months    Updated Medication List Outpatient Encounter Prescriptions as of 04/10/2014  Medication Sig  . famotidine (PEPCID) 20 MG tablet Take 1 tablet (20 mg total) by mouth at bedtime.  . fluconazole (DIFLUCAN) 100 MG tablet Take 1 daily x 7 days  . FLUoxetine (PROZAC) 20 MG capsule Take 1 capsule by mouth daily.  . fluticasone (FLONASE) 50 MCG/ACT nasal spray Place 2 sprays into both nostrils 2 (two) times daily.  Marland Kitchen levocetirizine (XYZAL) 5 MG tablet Take 1 tablet by mouth daily.  . Naproxen Sodium (ALEVE) 220 MG CAPS Take 1 capsule by mouth daily as  needed (arthritis pain).  . nystatin (MYCOSTATIN/NYSTOP) 100000 UNIT/GM POWD Use 2-3 x daily to affected area  . pantoprazole (PROTONIX) 40 MG tablet Take 40 mg by mouth 2 (two) times daily.   . traMADol (ULTRAM) 50 MG tablet Take 1 tablet (50 mg total) by mouth every 6 (six) hours as needed. For cough  . [DISCONTINUED] famotidine (PEPCID) 20 MG tablet Take 20 mg by mouth daily.  . [DISCONTINUED] fluticasone (FLONASE) 50 MCG/ACT nasal spray Place 2 sprays into both nostrils 2 (two) times daily.

## 2014-04-10 NOTE — Patient Instructions (Signed)
Take pepcid at bedtime No other changes Flu vaccine was given Return 4 months

## 2014-04-11 NOTE — Assessment & Plan Note (Signed)
Cyclical cough without primary lung disease. Ppt factor VCD, upper airway instability, allergic rhintis, GERD Overall improved Plan Take pepcid at bedtime No other changes Flu vaccine was given Return 4 months

## 2014-05-20 ENCOUNTER — Encounter: Payer: Self-pay | Admitting: Critical Care Medicine

## 2014-08-12 ENCOUNTER — Ambulatory Visit: Payer: Medicare PPO | Admitting: Critical Care Medicine

## 2014-08-28 ENCOUNTER — Ambulatory Visit (INDEPENDENT_AMBULATORY_CARE_PROVIDER_SITE_OTHER): Payer: Medicare PPO | Admitting: Critical Care Medicine

## 2014-08-28 ENCOUNTER — Encounter: Payer: Self-pay | Admitting: Critical Care Medicine

## 2014-08-28 VITALS — BP 104/76 | HR 64 | Temp 98.2°F | Ht 62.0 in | Wt 175.0 lb

## 2014-08-28 DIAGNOSIS — R059 Cough, unspecified: Secondary | ICD-10-CM

## 2014-08-28 DIAGNOSIS — R05 Cough: Secondary | ICD-10-CM

## 2014-08-28 MED ORDER — TRAMADOL HCL 50 MG PO TABS: 50.0000 mg | ORAL_TABLET | Freq: Four times a day (QID) | ORAL | Status: DC | PRN

## 2014-08-28 MED ORDER — BENZONATATE 100 MG PO CAPS: ORAL_CAPSULE | ORAL | Status: DC

## 2014-08-28 MED ORDER — METHYLPREDNISOLONE ACETATE 80 MG/ML IJ SUSP
120.0000 mg | Freq: Once | INTRAMUSCULAR | Status: AC
  Administered 2014-08-28: 120 mg via INTRAMUSCULAR

## 2014-08-28 NOTE — Progress Notes (Signed)
Subjective:    Patient ID: [name], female    DOB: 08/18/1950, 64 y.o.   MRN: [id number]  HPI   08/28/2014 Chief Complaint  Patient presents with  . 3 month follow up    Cough improved but still present.  Cough with clear mucus.  No SOB, wheezing, chest tightness/CP, or wheezing.   Cough comes and goes.  ?nervous twitch.  If pt gets warm will start coughing.  Pt notes dry hacky cough.  Pt has had some heartburn and belching and burping.  This was worse with GI virus several weeks.  Pt notes still pn drip. Pt with two URIs   Pt rx benzonatate. And helped per pcp    Review of Systems Constitutional:   No  weight loss, night sweats,  Fevers, chills, fatigue, lassitude. HEENT:   No headaches,  Difficulty swallowing,  Tooth/dental problems,  Sore throat,                No sneezing, itching, ear ache, nasal congestion, +++post nasal drip,   CV:  No chest pain,  Orthopnea, PND, swelling in lower extremities, anasarca, dizziness, palpitations  GI  No heartburn, indigestion, abdominal pain, nausea, vomiting, diarrhea, change in bowel habits, loss of appetite  Resp: No shortness of breath with exertion or at rest.  No excess mucus, no productive cough,  Notes non-productive cough,  No coughing up of blood.  No change in color of mucus.  No wheezing.  No chest wall deformity  Skin: no rash or lesions.  GU: no dysuria, change in color of urine, no urgency or frequency.  No flank pain.  MS:  No joint pain or swelling.  No decreased range of motion.  No back pain.  Psych:  No change in mood or affect. No depression or anxiety.  No memory loss.     Objective:   Physical Exam Filed Vitals:   08/28/14 1018  BP: 104/76  Pulse: 64  Temp: 98.2 F (36.8 C)  TempSrc: Oral  Height: 5\' 2"  (1.575 m)  Weight: 175 lb (79.379 kg)  SpO2: 96%    Gen: Pleasant, well-nourished, in no distress,  normal affect  ENT: No lesions,  mouth clear,  oropharynx clear, mild postnasal drip  Neck: No  JVD, no TMG, no carotid bruits  Lungs: No use of accessory muscles, no dullness to percussion, clear without rales or rhonchi,  prominent pseudowheeze  Cardiovascular: RRR, heart sounds normal, no murmur or gallops, no peripheral edema  Abdomen: soft and NT, no HSM,  BS normal  Musculoskeletal: No deformities, no cyanosis or clubbing  Neuro: alert, non focal  Skin: Warm, no lesions or rashes  No results found.        Assessment & Plan:   Cough Cyclical cough d/t gerd, VCD, upper airway instability, allergic rhinitis Plan Use tramadol / benzonatate per cough protocol Use saline nasal rinse three sprays ea nostril three times daily depomedrol 120mg  injection was given Stay on flonase, protonix, pepcid Return 2 months       Updated Medication List Outpatient Encounter Prescriptions as of 08/28/2014  Medication Sig  . famotidine (PEPCID) 20 MG tablet Take 1 tablet (20 mg total) by mouth at bedtime.  Marland Kitchen FLUoxetine (PROZAC) 20 MG capsule Take 1 capsule by mouth daily.  . fluticasone (FLONASE) 50 MCG/ACT nasal spray Place 2 sprays into both nostrils 2 (two) times daily.  . Naproxen Sodium (ALEVE) 220 MG CAPS Take 1 capsule by mouth daily as needed (arthritis  pain).  . pantoprazole (PROTONIX) 40 MG tablet Take 40 mg by mouth 2 (two) times daily.   . traMADol (ULTRAM) 50 MG tablet Take 1 tablet (50 mg total) by mouth every 6 (six) hours as needed. For cough  . [DISCONTINUED] traMADol (ULTRAM) 50 MG tablet Take 1 tablet (50 mg total) by mouth every 6 (six) hours as needed. For cough  . benzonatate (TESSALON) 100 MG capsule Take 1-2 every 4- 6 hours as needed for cough  . [DISCONTINUED] fluconazole (DIFLUCAN) 100 MG tablet Take 1 daily x 7 days (Patient not taking: Reported on 08/28/2014)  . [DISCONTINUED] levocetirizine (XYZAL) 5 MG tablet Take 1 tablet by mouth daily.  . [DISCONTINUED] nystatin (MYCOSTATIN/NYSTOP) 100000 UNIT/GM POWD Use 2-3 x daily to affected area (Patient not  taking: Reported on 08/28/2014)  . [EXPIRED] methylPREDNISolone acetate (DEPO-MEDROL) injection 120 mg

## 2014-08-28 NOTE — Patient Instructions (Signed)
Use tramadol / benzonatate per cough protocol Use saline nasal rinse three sprays ea nostril three times daily depomedrol 120mg  injection was given Stay on flonase, protonix, pepcid Return 2 months

## 2014-08-30 NOTE — Assessment & Plan Note (Signed)
Cyclical cough d/t gerd, VCD, upper airway instability, allergic rhinitis Plan Use tramadol / benzonatate per cough protocol Use saline nasal rinse three sprays ea nostril three times daily depomedrol 120mg  injection was given Stay on flonase, protonix, pepcid Return 2 months

## 2014-09-02 ENCOUNTER — Telehealth: Payer: Self-pay | Admitting: Critical Care Medicine

## 2014-09-02 MED ORDER — BENZONATATE 100 MG PO CAPS: ORAL_CAPSULE | ORAL | Status: DC

## 2014-09-02 MED ORDER — TRAMADOL HCL 50 MG PO TABS: 50.0000 mg | ORAL_TABLET | Freq: Four times a day (QID) | ORAL | Status: DC | PRN

## 2014-09-02 NOTE — Telephone Encounter (Signed)
Pt is going to have to leave in just a bit ok to leave a msg and she forgot on another meds tra mabol ultram 50mg  was she supposed to get this one [phone]

## 2014-09-02 NOTE — Telephone Encounter (Signed)
Ok to refill tramadol and tessalon perles

## 2014-09-02 NOTE — Telephone Encounter (Signed)
Called and spoke with pt and she is aware of refills that have been called to her pharmacy per PW.  Nothing further is needed.

## 2014-09-02 NOTE — Telephone Encounter (Signed)
Pt is requesting to have a refill of the tessalon.  She was taking this wrong---she was taking the tablets every 2 hours and realized that this was wrong and she is out of her medication.  PW please advise if ok to give a refill since this will be early.  Thanks  Allergies  Allergen Reactions  . Ciprofloxacin Other (See Comments)    Jaundice  Recently took course of cipro without difficulty  . Codeine Other (See Comments)    Unknown-pt is not sure of reaction  . Sulfa Antibiotics Other (See Comments)    Unknown- pt unsure of reaction; believes it may be nausea    Current Outpatient Prescriptions on File Prior to Visit  Medication Sig Dispense Refill  . benzonatate (TESSALON) 100 MG capsule Take 1-2 every 4- 6 hours as needed for cough 90 capsule 4  . famotidine (PEPCID) 20 MG tablet Take 1 tablet (20 mg total) by mouth at bedtime. 30 tablet 6  . FLUoxetine (PROZAC) 20 MG capsule Take 1 capsule by mouth daily.    . fluticasone (FLONASE) 50 MCG/ACT nasal spray Place 2 sprays into both nostrils 2 (two) times daily. 16 g 6  . Naproxen Sodium (ALEVE) 220 MG CAPS Take 1 capsule by mouth daily as needed (arthritis pain).    . pantoprazole (PROTONIX) 40 MG tablet Take 40 mg by mouth 2 (two) times daily.     . traMADol (ULTRAM) 50 MG tablet Take 1 tablet (50 mg total) by mouth every 6 (six) hours as needed. For cough 60 tablet 1   No current facility-administered medications on file prior to visit.

## 2014-09-26 ENCOUNTER — Telehealth: Payer: Self-pay | Admitting: Critical Care Medicine

## 2014-09-26 NOTE — Telephone Encounter (Signed)
Spoke with the pt  I advised to get rid of the ludens cherry cough drops  I advised her to try SUGAR-FREE jolly ranchers or any SUGAR-FREE hard candy that does NOT contain mint/menthol instead to avoid further dental problems  Swallowing water/ice chips can also be effective  Pt verbalized understanding  Will forward to PW to make him aware

## 2014-09-27 NOTE — Telephone Encounter (Signed)
Lm for Dr. Valetta Mole to make her aware of recs.

## 2014-09-27 NOTE — Telephone Encounter (Signed)
i 100% agree with these recommendations

## 2014-09-30 ENCOUNTER — Telehealth: Payer: Self-pay | Admitting: Critical Care Medicine

## 2014-09-30 NOTE — Telephone Encounter (Signed)
Spoke with Dr. Valetta Mole, states that pt has been using leuden's cherry cough drops that have left several cavities in her teeth.  States that using a sugar free cough drop with Sorbitol in it is just as damaging.  States that she would prefer pt to use a xylitol sweetened cough drop.  Dr. Valetta Mole will be ordering this with the pt.    Forwarding to Dr. Joya Gaskins to make pt aware.  Nothing needed at this time.

## 2014-09-30 NOTE — Telephone Encounter (Signed)
i am fine with this  

## 2014-10-15 ENCOUNTER — Telehealth: Payer: Self-pay | Admitting: Critical Care Medicine

## 2014-10-15 NOTE — Telephone Encounter (Signed)
Spoke with the pt  She states that the sugar free drops for cough are causing her to have gas and bloating  She states that her dentist is ordering her some xylitol sweetened cough drops, but she has not received them yet  She is asking for recs in the meantime  I advised sucking on ice chips or sipping water She states that she can only do this when she is at home  She is requesting something else Please advise, thanks!

## 2014-10-15 NOTE — Telephone Encounter (Signed)
Pt aware. Nothing further needed at this time.  °

## 2014-10-15 NOTE — Telephone Encounter (Signed)
LMTC x 1  

## 2014-10-15 NOTE — Telephone Encounter (Signed)
No other good thoughts sorry

## 2014-10-30 ENCOUNTER — Ambulatory Visit: Payer: Medicare PPO | Admitting: Critical Care Medicine

## 2014-11-20 ENCOUNTER — Ambulatory Visit (HOSPITAL_COMMUNITY)
Admission: RE | Admit: 2014-11-20 | Discharge: 2014-11-20 | Disposition: A | Discharge status: Home / Self Care | Payer: Medicare PPO | Source: Ambulatory Visit | Attending: Internal Medicine | Admitting: Internal Medicine

## 2014-11-20 DIAGNOSIS — R55 Syncope and collapse: Secondary | ICD-10-CM | POA: Diagnosis not present

## 2014-11-20 NOTE — Progress Notes (Signed)
48 hour Holter Monitor in progress. 

## 2014-12-02 ENCOUNTER — Ambulatory Visit (INDEPENDENT_AMBULATORY_CARE_PROVIDER_SITE_OTHER): Payer: Medicare HMO | Admitting: Critical Care Medicine

## 2014-12-02 ENCOUNTER — Encounter: Payer: Self-pay | Admitting: Critical Care Medicine

## 2014-12-02 VITALS — BP 118/86 | HR 72 | Temp 98.3°F | Ht 62.0 in | Wt 172.8 lb

## 2014-12-02 DIAGNOSIS — R05 Cough: Secondary | ICD-10-CM

## 2014-12-02 DIAGNOSIS — R059 Cough, unspecified: Secondary | ICD-10-CM

## 2014-12-02 MED ORDER — TRAMADOL HCL 50 MG PO TABS: 50.0000 mg | ORAL_TABLET | Freq: Four times a day (QID) | ORAL | Status: DC | PRN

## 2014-12-02 NOTE — Patient Instructions (Signed)
Use tramadol for severe cough spells Stop benzonatate Use xylitol as needed for dry mouth I have asked Dr Lake Bells to see you for second opinion and follow up

## 2014-12-02 NOTE — Assessment & Plan Note (Signed)
Cyclical cough without primary lung disease with associated reflux disease, postnasal drip, vocal cord dysfunction, upper airway instability and allergic rhinitis. Unfortunately with overuse of sugar based candy drops the patient developed dental caries and has had to change products and with this for cough control is not as effective. The patient denies any dyspnea. Reflux is under good control. She notes her cough comes on with laughing and talking excessively. Plan Tramadol as needed for severe coughing spells Use xylitol throat drops as prescribed Continue proton pump inhibitor use Follow reflux diet The benzonatate was discontinued as this was not effective

## 2014-12-02 NOTE — Progress Notes (Signed)
Subjective:    Patient ID: [name], female    DOB: 06/14/1951, 64 y.o.   MRN: [id number]  Cough Associated symptoms include postnasal drip and a sore throat. Pertinent negatives include no chest pain, chills, ear pain, fever, headaches, myalgias, rash, rhinorrhea, shortness of breath or wheezing.   12/02/2014 Chief Complaint  Patient presents with  . Follow-up    Cough still present.  Slightly worse.  Prod with clear mucus.  SOB when walking.    Pt with cyclic cough.  Pt was using sugared candy and gave pt cavities.  Using mint based xylitol.   Cough may be worse, and may not be any better.  If sits down for 55min will then get up and will cough.  If laugh or talk , will cough. Cough is dry. Mouth stays dry.     Pt denies any significant sore throat, nasal congestion or excess secretions, fever, chills, sweats, unintended weight loss, pleurtic or exertional chest pain, orthopnea PND, or leg swelling Pt denies any increase in rescue therapy over baseline, denies waking up needing it or having any early am or nocturnal exacerbations of coughing/wheezing/or dyspnea. Pt also denies any obvious fluctuation in symptoms with  weather or environmental change or other alleviating or aggravating factors   Current Medications, Allergies, Complete Past Medical History, Past Surgical History, Family History, and Social History were reviewed in [employer] record.  Past Medical History  Diagnosis Date  . Anxiety   . Depression     OCD  . CHF (congestive heart failure)     swelling of feet & ankles  . Shortness of breath     with exertion  . GERD (gastroesophageal reflux disease)     severe  . Asthma     had 1 episode 1 year ago-no more problem  . OCD (obsessive compulsive disorder)   . Cough   . Yeast infection 04/09/2014     Family History  Problem Relation Age of Onset  . Colon cancer Neg Hx   . Asthma Mother   . Macular degeneration Mother   .  Hypertension Mother   . Alzheimer's disease Father   . Other Sister     blood clots  . Other Son     enlarged spleen  . Diabetes Maternal Grandfather   . Diabetes Paternal Grandfather      History   Social History  . Marital Status: Married    Spouse Name: N/A  . Number of Children: 1  . Years of Education: N/A   Occupational History  . retired    Social History Main Topics  . Smoking status: Never Smoker   . Smokeless tobacco: Never Used  . Alcohol Use: No  . Drug Use: No  . Sexual Activity: Not Currently    Birth Control/ Protection: Surgical   Other Topics Concern  . Not on file   Social History Narrative     Allergies  Allergen Reactions  . Ciprofloxacin Other (See Comments)    Jaundice  Recently took course of cipro without difficulty  . Codeine Other (See Comments)    Unknown-pt is not sure of reaction  . Sulfa Antibiotics Other (See Comments)    Unknown- pt unsure of reaction; believes it may be nausea     Outpatient Prescriptions Prior to Visit  Medication Sig Dispense Refill  . benzonatate (TESSALON) 100 MG capsule Take 1-2 capsules  every 4- 6 hours as needed for cough 90 capsule 4  .  famotidine (PEPCID) 20 MG tablet Take 1 tablet (20 mg total) by mouth at bedtime. 30 tablet 6  . FLUoxetine (PROZAC) 20 MG capsule Take 1 capsule by mouth daily.    . fluticasone (FLONASE) 50 MCG/ACT nasal spray Place 2 sprays into both nostrils 2 (two) times daily. 16 g 6  . Naproxen Sodium (ALEVE) 220 MG CAPS Take 1 capsule by mouth daily as needed (arthritis pain).    . pantoprazole (PROTONIX) 40 MG tablet Take 40 mg by mouth 2 (two) times daily.     . traMADol (ULTRAM) 50 MG tablet Take 1 tablet (50 mg total) by mouth every 6 (six) hours as needed. For cough 60 tablet 1   No facility-administered medications prior to visit.    Review of Systems  Constitutional: Negative for fever, chills, diaphoresis, activity change, appetite change, fatigue and unexpected  weight change.  HENT: Positive for postnasal drip, sore throat, trouble swallowing and voice change. Negative for congestion, dental problem, ear discharge, ear pain, facial swelling, hearing loss, mouth sores, nosebleeds, rhinorrhea, sinus pressure, sneezing and tinnitus.   Eyes: Negative for photophobia, discharge, itching and visual disturbance.  Respiratory: Positive for cough. Negative for apnea, choking, chest tightness, shortness of breath, wheezing and stridor.   Cardiovascular: Negative for chest pain, palpitations and leg swelling.  Gastrointestinal: Negative for nausea, vomiting, abdominal pain, constipation, blood in stool and abdominal distention.  Genitourinary: Negative for dysuria, urgency, frequency, hematuria, flank pain, decreased urine volume and difficulty urinating.  Musculoskeletal: Negative for myalgias, back pain, joint swelling, arthralgias, gait problem, neck pain and neck stiffness.  Skin: Negative for color change, pallor and rash.  Neurological: Negative for dizziness, tremors, seizures, syncope, speech difficulty, weakness, light-headedness, numbness and headaches.  Hematological: Negative for adenopathy. Does not bruise/bleed easily.  Psychiatric/Behavioral: Negative for confusion, sleep disturbance and agitation. The patient is not nervous/anxious.        Objective:   Physical Exam  Constitutional: She appears well-developed and well-nourished. She is active.  Non-toxic appearance. She does not appear ill. No distress.  HENT:  Head: Normocephalic and atraumatic.  Nose: No mucosal edema, rhinorrhea, sinus tenderness, nasal deformity or septal deviation. No epistaxis. Right sinus exhibits no maxillary sinus tenderness and no frontal sinus tenderness. Left sinus exhibits no maxillary sinus tenderness and no frontal sinus tenderness.  Mouth/Throat: Oropharynx is clear and moist. No oropharyngeal exudate.  Eyes: Conjunctivae and EOM are normal. Pupils are equal,  round, and reactive to light. Right eye exhibits no discharge. Left eye exhibits no discharge. No scleral icterus.  Neck: Normal range of motion. Neck supple. Normal carotid pulses, no hepatojugular reflux and no JVD present. No tracheal tenderness and no muscular tenderness present. Carotid bruit is not present. No rigidity. No tracheal deviation, no edema, no erythema and normal range of motion present. No thyroid mass and no thyromegaly present.  Cardiovascular: Normal rate, regular rhythm, S1 normal, S2 normal, normal heart sounds, intact distal pulses and normal pulses.  PMI is not displaced.  Exam reveals no gallop, no S3, no S4, no distant heart sounds and no friction rub.   No murmur heard.  No systolic murmur is present   No diastolic murmur is present  Pulmonary/Chest: No accessory muscle usage or stridor. No apnea and no tachypnea. No respiratory distress. She has no decreased breath sounds. She has no wheezes. She has no rhonchi. She has no rales. Chest wall is not dull to percussion. She exhibits no mass, no tenderness, no bony tenderness and no  deformity.  Abdominal: Soft. Normal appearance and bowel sounds are normal. She exhibits no distension, no ascites and no mass. There is no hepatosplenomegaly. There is no tenderness. There is no rigidity, no rebound, no guarding and no CVA tenderness.  Musculoskeletal: Normal range of motion.  Lymphadenopathy:       Head (right side): No submental and no submandibular adenopathy present.       Head (left side): No submental and no submandibular adenopathy present.    She has no cervical adenopathy.    She has no axillary adenopathy.  Neurological: She is alert. She has normal strength and normal reflexes. No cranial nerve deficit or sensory deficit.  Skin: Skin is warm and dry. No bruising, no ecchymosis, no lesion and no rash noted. She is not diaphoretic. No cyanosis or erythema. No pallor. Nails show no clubbing.  Psychiatric: She has a  normal mood and affect. Her speech is normal and behavior is normal.  Vitals reviewed.         Assessment & Plan:  I personally reviewed all images and lab data in the [facility] system as well as any outside material available during this office visit and agree with the  radiology impressions.  I also have reviewed any data /notes/records if available in care everywhere.  Cough Cyclical cough without primary lung disease with associated reflux disease, postnasal drip, vocal cord dysfunction, upper airway instability and allergic rhinitis. Unfortunately with overuse of sugar based candy drops the patient developed dental caries and has had to change products and with this for cough control is not as effective. The patient denies any dyspnea. Reflux is under good control. She notes her cough comes on with laughing and talking excessively. Plan Tramadol as needed for severe coughing spells Use xylitol throat drops as prescribed Continue proton pump inhibitor use Follow reflux diet The benzonatate was discontinued as this was not effective     Diagnoses and all orders for this visit:  Cough  Other orders -     traMADol (ULTRAM) 50 MG tablet; Take 1 tablet (50 mg total) by mouth every 6 (six) hours as needed. For cough    I

## 2014-12-16 ENCOUNTER — Other Ambulatory Visit: Payer: Self-pay | Admitting: Critical Care Medicine

## 2015-03-04 ENCOUNTER — Ambulatory Visit (INDEPENDENT_AMBULATORY_CARE_PROVIDER_SITE_OTHER): Payer: Medicare HMO | Admitting: Pulmonary Disease

## 2015-03-04 ENCOUNTER — Encounter: Payer: Self-pay | Admitting: Pulmonary Disease

## 2015-03-04 VITALS — BP 130/68 | HR 63 | Ht 62.0 in | Wt 171.0 lb

## 2015-03-04 DIAGNOSIS — R059 Cough, unspecified: Secondary | ICD-10-CM

## 2015-03-04 DIAGNOSIS — R05 Cough: Secondary | ICD-10-CM | POA: Diagnosis not present

## 2015-03-04 NOTE — Patient Instructions (Signed)
Keep taking all of your medications as you're doing Start taking generic Zyrtec (cetirizine 10 mg) daily If after 2 weeks he were still having a postnasal drip call me and let me know so I can call in a prescription for a medication called Singulair We will see you back in 4-6 weeks, if you're not better at that point we will consider other treatment for your cough

## 2015-03-04 NOTE — Assessment & Plan Note (Signed)
She continues to struggle with cough which is quite severe and leading to posttussive emesis and incontinence at times. She is compliant with medical therapy but she continues to have postnasal drip. She struggles with dry mouth and sugar-containing candies which have led to significant dental caries. I would like to try to maximize treatment for her postnasal drip, if she does not respond to that treatment then she may need gabapentin and or Elavil.  Plan: Continue tramadol Continue nasal saline rinse and nasal steroid Continue antiacid Add generic Zyrtec If no improvement with generic Zyrtec then will call in a prescription for Singulair If no improvement with Zyrtec and/or Singulair then will write a prescription for gabapentin on the next visit

## 2015-03-04 NOTE — Progress Notes (Signed)
Subjective:    Patient ID: [name], female    DOB: [birth year], 64 y.o.   MRN: [id number]  Synopsis: Former patient of Dr. Asencion Noble with vocal cord dysfunction, allergic rhinitis, and acid reflux with leading to chronic, cyclical cough Labeled as asthma, but 2015 spirometry showed no airflow obstruction.  Inhalers don't help cough. Chest x-ray 2015 normal.  HPI  Chief Complaint  Patient presents with  . Follow-up    second opinion per PW for severe cough.  pt c/o prod cough with clear mucus X15 years.  s/s worse when standing up or when she gets overheated.     Tyshell says that the cough has been persistent lately, sometimes it is well controlled, "sometimes not".  She says that it is unpredictable when it will flare up.  Sometimes when walking she has more cough, if she gets "excited" she coughs, if she "overtalks" she coughs.  If she "gets tickled and tries to hold it in" she coughs.  It continues to bother her quite a bit.  She has been taking the tramadol but it doesn't always help.  Sometimes it helps, other times not.  She says that she has had a cough which will make her throw up at times.  It is bothersome in church, funerals, etc.  She tries to sip water at all times, and she is taking Scientist, forensic.  She can't take sugar free candies to make her mouth moist because it upsets her stomach, but she has been taking the Xyliment.  She is taking the flonase and saline rinses, but she still has post nasal drip. She is not taking Zyrtec or any anti-histamine.   Past Medical History  Diagnosis Date  . Anxiety   . Depression     OCD  . CHF (congestive heart failure)     swelling of feet & ankles  . Shortness of breath     with exertion  . GERD (gastroesophageal reflux disease)     severe  . Asthma     had 1 episode 1 year ago-no more problem  . OCD (obsessive compulsive disorder)   . Cough   . Yeast infection 04/09/2014      Review of Systems  Constitutional:  Negative for fever, chills and fatigue.  HENT: Positive for postnasal drip. Negative for nosebleeds, rhinorrhea and sinus pressure.   Respiratory: Positive for cough. Negative for shortness of breath and wheezing.   Cardiovascular: Negative for chest pain, palpitations and leg swelling.       Objective:   Physical Exam Filed Vitals:   03/04/15 0918  BP: 130/68  Pulse: 63  Height: 5\' 2"  (1.575 m)  Weight: 171 lb (77.565 kg)  SpO2: 100%  RA   Gen: well appearing HENT: OP clear, neck supple PULM: CTA B, normal percussion CV: RRR, no mgr, trace edema GI: BS+, soft, nontender Derm: no cyanosis or rash Psyche: normal mood and affect   12/2013 CXR personally reviewed> normal lung parenchyma without abnormality 12/2013 Spirometry: no air flow obstructoin     Assessment & Plan:  Cough She continues to struggle with cough which is quite severe and leading to posttussive emesis and incontinence at times. She is compliant with medical therapy but she continues to have postnasal drip. She struggles with dry mouth and sugar-containing candies which have led to significant dental caries. I would like to try to maximize treatment for her postnasal drip, if she does not respond to that treatment then she may  need gabapentin and or Elavil.  Plan: Continue tramadol Continue nasal saline rinse and nasal steroid Continue antiacid Add generic Zyrtec If no improvement with generic Zyrtec then will call in a prescription for Singulair If no improvement with Zyrtec and/or Singulair then will write a prescription for gabapentin on the next visit       Current outpatient prescriptions:  .  antiseptic oral rinse (BIOTENE) LIQD, 15 mLs by Mouth Rinse route as needed for dry mouth., Disp: , Rfl:  .  famotidine (PEPCID) 20 MG tablet, TAKE ONE TABLET BY MOUTH AT BEDTIME., Disp: 30 tablet, Rfl: 5 .  FLUoxetine (PROZAC) 20 MG capsule, Take 1 capsule by mouth daily., Disp: , Rfl:  .  fluticasone  (FLONASE) 50 MCG/ACT nasal spray, Place 2 sprays into both nostrils 2 (two) times daily., Disp: 16 g, Rfl: 5 .  Naproxen Sodium (ALEVE) 220 MG CAPS, Take 1 capsule by mouth daily as needed (arthritis pain)., Disp: , Rfl:  .  pantoprazole (PROTONIX) 40 MG tablet, Take 40 mg by mouth 2 (two) times daily. , Disp: , Rfl:  .  sodium chloride (OCEAN) 0.65 % SOLN nasal spray, Place 1 spray into both nostrils as needed for congestion., Disp: , Rfl:  .  traMADol (ULTRAM) 50 MG tablet, Take 1 tablet (50 mg total) by mouth every 6 (six) hours as needed. For cough, Disp: 60 tablet, Rfl: 1 .  UNABLE TO FIND, Med Name: Oracoat- 1 tablet prn, Disp: , Rfl:

## 2015-03-06 ENCOUNTER — Telehealth: Payer: Self-pay | Admitting: Pulmonary Disease

## 2015-03-06 MED ORDER — TRAMADOL HCL 50 MG PO TABS: 50.0000 mg | ORAL_TABLET | Freq: Four times a day (QID) | ORAL | Status: DC | PRN

## 2015-03-06 NOTE — Telephone Encounter (Signed)
Pt seen 03/04/15 by Dr. Lake Bells Pt is requesting refill on tramadol 50 mg  Take 1 tablet (50 mg total) by mouth every 6 (six) hours as needed. For cough Last refilled 12/02/14 by PW #[id number] x 1 refill  Please advise thanks

## 2015-03-06 NOTE — Telephone Encounter (Signed)
Spoke with pt. Advised her that we are going to refill her medication. Rx has been called in. Nothing further was needed.

## 2015-03-06 NOTE — Telephone Encounter (Signed)
Pt returning can be reached @ [phone].[name]

## 2015-03-06 NOTE — Telephone Encounter (Signed)
lmtcb x1 for pt. 

## 2015-03-06 NOTE — Telephone Encounter (Signed)
OK by me 

## 2015-03-17 ENCOUNTER — Telehealth: Payer: Self-pay | Admitting: Pulmonary Disease

## 2015-03-17 MED ORDER — MONTELUKAST SODIUM 10 MG PO TABS: 10.0000 mg | ORAL_TABLET | Freq: Every day | ORAL | Status: DC

## 2015-03-17 NOTE — Telephone Encounter (Signed)
[phone] pt cb

## 2015-03-17 NOTE — Telephone Encounter (Signed)
Pt aware of recs. RX sent in. Nothing further needed 

## 2015-03-17 NOTE — Telephone Encounter (Signed)
Per 03/04/15 OV; Patient Instructions       Keep taking all of your medications as you're doing Start taking generic Zyrtec (cetirizine 10 mg) daily If after 2 weeks he were still having a postnasal drip call me and let me know so I can call in a prescription for a medication called Singulair We will see you back in 4-6 weeks, if you're not better at that point we will consider other treatment for your cough  --  Spoke with pt. She is still having a lot of PND. She is taking the zyrtex and using flonase. She is also coughing up clear phlem. Please advise Dr. Lake Bells thanks

## 2015-03-17 NOTE — Telephone Encounter (Signed)
Rx singulair 10mg  daily, generic OK, disp 30, refil 2

## 2015-03-17 NOTE — Telephone Encounter (Signed)
ATC received fast busy signal x 3 wcb

## 2015-04-03 ENCOUNTER — Ambulatory Visit: Payer: Medicare HMO | Admitting: Pulmonary Disease

## 2015-05-05 ENCOUNTER — Encounter: Payer: Self-pay | Admitting: Pulmonary Disease

## 2015-05-05 ENCOUNTER — Ambulatory Visit (INDEPENDENT_AMBULATORY_CARE_PROVIDER_SITE_OTHER): Payer: Medicare HMO | Admitting: Pulmonary Disease

## 2015-05-05 VITALS — BP 108/68 | HR 84 | Ht 62.0 in | Wt 169.8 lb

## 2015-05-05 DIAGNOSIS — R05 Cough: Secondary | ICD-10-CM

## 2015-05-05 DIAGNOSIS — R059 Cough, unspecified: Secondary | ICD-10-CM

## 2015-05-05 NOTE — Progress Notes (Signed)
Subjective:    Patient ID: [name], female    DOB: [birth year], 64 y.o.   MRN: [id number]  Synopsis: Former patient of Dr. Asencion [name] with vocal cord dysfunction, allergic rhinitis, and acid reflux with leading to chronic, cyclical cough Labeled as asthma, but 2015 spirometry showed no airflow obstruction.  Inhalers don't help cough. Chest x-ray 2015 normal.  HPI  Chief Complaint  Patient presents with  . Follow-up    Cough. Pt states that cough has improved but is still present. Pt notices that cough is increased when she is warm or sitting for any period of time. Cough is wet with clear mucus production.    [name] took the Zyrtec and says that it didn't help too much so she took the montelukast we prescribed. She is a bit better now, but she still has the cough some some.  She is able to sit in church now without disturbing everyone.  Still has some clear mucus production from time to time.  She is walking a lot.  She is still having nose drainage which is nearly constant.     Past Medical History  Diagnosis Date  . Anxiety   . Depression     OCD  . CHF (congestive heart failure) (HCC)     swelling of feet & ankles  . Shortness of breath     with exertion  . GERD (gastroesophageal reflux disease)     severe  . Asthma     had 1 episode 1 year ago-no more problem  . OCD (obsessive compulsive disorder)   . Cough   . Yeast infection 04/09/2014      Review of Systems  Constitutional: Negative for fever, chills and fatigue.  HENT: Positive for postnasal drip. Negative for nosebleeds, rhinorrhea and sinus pressure.   Respiratory: Positive for cough. Negative for shortness of breath and wheezing.   Cardiovascular: Negative for chest pain, palpitations and leg swelling.       Objective:   Physical Exam Filed Vitals:   05/05/15 1350  BP: 108/68  Pulse: 84  Height: 5\' 2"  (1.575 m)  Weight: 169 lb 12.8 oz (77.021 kg)  SpO2: 96%  RA   Gen: well appearing HENT:  OP clear, neck supple PULM: CTA B, normal percussion CV: RRR, no mgr, trace edema GI: BS+, soft, nontender Derm: no cyanosis or rash Psyche: normal mood and affect   12/2013 CXR personally reviewed> normal lung parenchyma without abnormality 12/2013 Spirometry: no air flow obstructoin     Assessment & Plan:  Cough She now has better control with singulair, pantoprazole, famotidine, saline sprays for nose and fluticasone nasal spray with prn. There is still some mild residual cough which she is not too bothered by.  I think this would improve if she took Zyrtec in addition to the above treatment and cut back on cafeeine and chocolate which make the GERD worse. Because of her good control there is no role for adding gabapentin.    I told her today that she tends to embellish her cough quite a bit. This does exacerbate her vocal cord irritation. I told her today to try to decrease the severity and frequency of her cough by suppressing it.  Plan: I want her to continue the medication regimen written above Add Zyrtec because of ongoing postnasal drip Cut back on caffeine and chocolate Try to suppress cough is much as possible Follow-up in 6 months or sooner if needed     Current outpatient  prescriptions:  .  antiseptic oral rinse (BIOTENE) LIQD, 15 mLs by Mouth Rinse route as needed for dry mouth., Disp: , Rfl:  .  famotidine (PEPCID) 20 MG tablet, TAKE ONE TABLET BY MOUTH AT BEDTIME., Disp: 30 tablet, Rfl: 5 .  FLUoxetine (PROZAC) 20 MG capsule, Take 1 capsule by mouth daily., Disp: , Rfl:  .  fluticasone (FLONASE) 50 MCG/ACT nasal spray, Place 2 sprays into both nostrils 2 (two) times daily., Disp: 16 g, Rfl: 5 .  montelukast (SINGULAIR) 10 MG tablet, Take 1 tablet (10 mg total) by mouth at bedtime., Disp: 30 tablet, Rfl: 2 .  Naproxen Sodium (ALEVE) 220 MG CAPS, Take 1 capsule by mouth daily as needed (arthritis pain)., Disp: , Rfl:  .  pantoprazole (PROTONIX) 40 MG tablet, Take 40  mg by mouth 2 (two) times daily. , Disp: , Rfl:  .  sodium chloride (OCEAN) 0.65 % SOLN nasal spray, Place 1 spray into both nostrils as needed for congestion., Disp: , Rfl:  .  traMADol (ULTRAM) 50 MG tablet, Take 1 tablet (50 mg total) by mouth every 6 (six) hours as needed. For cough, Disp: 60 tablet, Rfl: 1 .  UNABLE TO FIND, Med Name: Oracoat- 1 tablet prn, Disp: , Rfl:

## 2015-05-05 NOTE — Assessment & Plan Note (Addendum)
She now has better control with singulair, pantoprazole, famotidine, saline sprays for nose and fluticasone nasal spray with prn. There is still some mild residual cough which she is not too bothered by.  I think this would improve if she took Zyrtec in addition to the above treatment and cut back on cafeeine and chocolate which make the GERD worse. Because of her good control there is no role for adding gabapentin.    I told her today that she tends to embellish her cough quite a bit. This does exacerbate her vocal cord irritation. I told her today to try to decrease the severity and frequency of her cough by suppressing it.  Plan: I want her to continue the medication regimen written above Add Zyrtec because of ongoing postnasal drip Cut back on caffeine and chocolate Try to suppress cough is much as possible Follow-up in 6 months or sooner if needed

## 2015-05-05 NOTE — Patient Instructions (Signed)
Keep taking the pantoprazole and famotidine as you're doing Keep taking the fluticasone nasal spray as you're doing Keep using the saline rinses as you're doing Keep taking Singulair 0 doing Add Zyrtec (generic is okay, it is called cetirizine 10 mg daily) to your medication regimen Try to cut back on chocolate and caffeine We will see you back in 6 months or sooner her cough is worse.

## 2015-05-20 ENCOUNTER — Other Ambulatory Visit: Payer: Self-pay | Admitting: Pulmonary Disease

## 2015-06-23 DIAGNOSIS — R110 Nausea: Secondary | ICD-10-CM | POA: Diagnosis not present

## 2015-06-23 DIAGNOSIS — Z6832 Body mass index (BMI) 32.0-32.9, adult: Secondary | ICD-10-CM | POA: Diagnosis not present

## 2015-07-08 DIAGNOSIS — J019 Acute sinusitis, unspecified: Secondary | ICD-10-CM | POA: Diagnosis not present

## 2015-08-11 DIAGNOSIS — Z6833 Body mass index (BMI) 33.0-33.9, adult: Secondary | ICD-10-CM | POA: Diagnosis not present

## 2015-08-11 DIAGNOSIS — R05 Cough: Secondary | ICD-10-CM | POA: Diagnosis not present

## 2015-08-11 DIAGNOSIS — H109 Unspecified conjunctivitis: Secondary | ICD-10-CM | POA: Diagnosis not present

## 2015-08-14 DIAGNOSIS — F064 Anxiety disorder due to known physiological condition: Secondary | ICD-10-CM | POA: Diagnosis not present

## 2015-08-20 DIAGNOSIS — F411 Generalized anxiety disorder: Secondary | ICD-10-CM | POA: Diagnosis not present

## 2015-09-01 DIAGNOSIS — H6983 Other specified disorders of Eustachian tube, bilateral: Secondary | ICD-10-CM | POA: Diagnosis not present

## 2015-09-01 DIAGNOSIS — H9011 Conductive hearing loss, unilateral, right ear, with unrestricted hearing on the contralateral side: Secondary | ICD-10-CM | POA: Diagnosis not present

## 2015-09-25 ENCOUNTER — Ambulatory Visit (INDEPENDENT_AMBULATORY_CARE_PROVIDER_SITE_OTHER): Payer: Commercial Managed Care - HMO | Admitting: Otolaryngology

## 2015-09-25 DIAGNOSIS — H6983 Other specified disorders of Eustachian tube, bilateral: Secondary | ICD-10-CM | POA: Diagnosis not present

## 2015-09-25 DIAGNOSIS — H9042 Sensorineural hearing loss, unilateral, left ear, with unrestricted hearing on the contralateral side: Secondary | ICD-10-CM

## 2015-10-01 DIAGNOSIS — Z79899 Other long term (current) drug therapy: Secondary | ICD-10-CM | POA: Diagnosis not present

## 2015-10-01 DIAGNOSIS — K219 Gastro-esophageal reflux disease without esophagitis: Secondary | ICD-10-CM | POA: Diagnosis not present

## 2015-10-01 DIAGNOSIS — F429 Obsessive-compulsive disorder, unspecified: Secondary | ICD-10-CM | POA: Diagnosis not present

## 2015-10-01 DIAGNOSIS — I509 Heart failure, unspecified: Secondary | ICD-10-CM | POA: Diagnosis not present

## 2015-10-09 ENCOUNTER — Other Ambulatory Visit (HOSPITAL_COMMUNITY): Payer: Self-pay | Admitting: Internal Medicine

## 2015-10-09 DIAGNOSIS — R05 Cough: Secondary | ICD-10-CM | POA: Diagnosis not present

## 2015-10-09 DIAGNOSIS — D72819 Decreased white blood cell count, unspecified: Secondary | ICD-10-CM | POA: Diagnosis not present

## 2015-10-09 DIAGNOSIS — Z1231 Encounter for screening mammogram for malignant neoplasm of breast: Secondary | ICD-10-CM

## 2015-10-09 DIAGNOSIS — Z6833 Body mass index (BMI) 33.0-33.9, adult: Secondary | ICD-10-CM | POA: Diagnosis not present

## 2015-10-13 ENCOUNTER — Ambulatory Visit (HOSPITAL_COMMUNITY)
Admission: RE | Admit: 2015-10-13 | Discharge: 2015-10-13 | Disposition: A | Discharge status: Home / Self Care | Payer: Commercial Managed Care - HMO | Source: Ambulatory Visit | Attending: Internal Medicine | Admitting: Internal Medicine

## 2015-10-13 DIAGNOSIS — Z1231 Encounter for screening mammogram for malignant neoplasm of breast: Secondary | ICD-10-CM | POA: Diagnosis not present

## 2015-10-16 ENCOUNTER — Ambulatory Visit (HOSPITAL_COMMUNITY): Payer: Medicare HMO

## 2015-11-05 ENCOUNTER — Encounter: Payer: Self-pay | Admitting: Pulmonary Disease

## 2015-11-05 ENCOUNTER — Encounter: Payer: Self-pay | Admitting: Internal Medicine

## 2015-11-05 ENCOUNTER — Ambulatory Visit (INDEPENDENT_AMBULATORY_CARE_PROVIDER_SITE_OTHER): Payer: Commercial Managed Care - HMO | Admitting: Pulmonary Disease

## 2015-11-05 VITALS — BP 126/74 | HR 60 | Ht 62.0 in | Wt 173.0 lb

## 2015-11-05 DIAGNOSIS — K219 Gastro-esophageal reflux disease without esophagitis: Secondary | ICD-10-CM | POA: Diagnosis not present

## 2015-11-05 DIAGNOSIS — R059 Cough, unspecified: Secondary | ICD-10-CM

## 2015-11-05 DIAGNOSIS — R05 Cough: Secondary | ICD-10-CM | POA: Diagnosis not present

## 2015-11-05 MED ORDER — IPRATROPIUM BROMIDE 0.03 % NA SOLN: 2.0000 | Freq: Four times a day (QID) | NASAL | Status: DC

## 2015-11-05 NOTE — Addendum Note (Signed)
Addended by: Len Blalock on: 11/05/2015 10:25 AM   Modules accepted: Orders

## 2015-11-05 NOTE — Progress Notes (Signed)
Subjective:    Patient ID: [name], female    DOB: September 08, 1950, 65 y.o.   MRN: [id number]  Synopsis: Former patient of Dr. Asencion Noble with vocal cord dysfunction, allergic rhinitis, and acid reflux with leading to chronic, cyclical cough Labeled as asthma, but 2015 spirometry showed no airflow obstruction.  Inhalers don't help cough. Chest x-ray 2015 normal.  HPI  Chief Complaint  Patient presents with  . Follow-up    pt states she is doing well-cough is still present, worse when sitting for a long time or with exertion.  Also notes occ. dry mouth.    [name] has been doing [place]. She still has a problem when she "sits a length of time or when [she] get[s] hot" she will cough. 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 Specifically she says that the cough occurs when is sitting still for too long (like more than 30 minutes).  If she "gets tickled (laughing)" she will cough. She has had some indigestion and have a sour taste in her mouth. This is not necessarily associated with the cough. She has sinus problems for which she takes flonase regularly.  She has post nasal drip and this is associated with the cough. Where she walks she will sometimes have more sinus symptoms. She is taking the singulair.   Past Medical History  Diagnosis Date  . Anxiety   . Depression     OCD  . CHF (congestive heart failure) (HCC)     swelling of feet & ankles  . Shortness of breath     with exertion  . GERD (gastroesophageal reflux disease)     severe  . Asthma     had 1 episode 1 year ago-no more problem  . OCD (obsessive compulsive disorder)   . Cough   . Yeast infection 04/09/2014      Review of Systems  Constitutional: Negative for fever, chills and fatigue.  HENT: Positive for postnasal drip. Negative for nosebleeds, rhinorrhea and sinus pressure.   Respiratory: Positive for cough. Negative for shortness of breath and wheezing.     Cardiovascular: Negative for chest pain, palpitations and leg swelling.       Objective:   Physical Exam Filed Vitals:   11/05/15 0950  BP: 126/74  Pulse: 60  Height: 5\' 2"  (1.575 m)  Weight: 173 lb (78.472 kg)  SpO2: 95%  RA   Gen: well appearing HENT: OP clear, neck supple PULM: CTA B, normal percussion CV: RRR, no mgr, trace edema GI: BS+, soft, nontender Derm: no cyanosis or rash Psyche: normal mood and affect      Assessment & Plan:  Cough This continues to be a severe problem for her. I believe that it is due to acid reflux, postnasal drip, and irritable larynx syndrome. Despite taking acid reflux therapy she continues to have a sour taste in her mouth from time to time. Also, she continues to have postnasal drip symptoms. This is in the setting of taking a nasal steroid, antihistamine, and Singulair. She also has what sounds like vasomotor rhinitis symptoms as well. All of this likely contributes to her cough. Finally, I think she has irritable larynx syndrome which is perpetuated by continual coughing throughout the course the day.  Plan: Refer to gastroenterology for assessment of acid reflux in the setting of high-dose antacid therapy Continue taking sinus therapy, I'm going to add ipratropium nasal sprays for vasomotor rhinitis I'm  going to ask her psychiatrist consider whether or not we could switch her to Elavil from duloxetine as this sometimes helps people who have irritable larynx syndrome Follow-up 3 months  Greater than 50% of time in a 27 minute visit were spent face-to-face.     Current outpatient prescriptions:  .  cetirizine (ZYRTEC) 10 MG tablet, Take 10 mg by mouth daily., Disp: , Rfl:  .  famotidine (PEPCID) 20 MG tablet, TAKE ONE TABLET BY MOUTH AT BEDTIME., Disp: 30 tablet, Rfl: 5 .  FLUoxetine (PROZAC) 20 MG capsule, Take 1 capsule by mouth daily., Disp: , Rfl:  .  fluticasone (FLONASE) 50 MCG/ACT nasal spray, Place 2 sprays into both  nostrils 2 (two) times daily., Disp: 16 g, Rfl: 5 .  montelukast (SINGULAIR) 10 MG tablet, TAKE ONE TABLET BY MOUTH AT BEDTIME., Disp: 30 tablet, Rfl: 5 .  Naproxen Sodium (ALEVE) 220 MG CAPS, Take 1 capsule by mouth daily as needed (arthritis pain)., Disp: , Rfl:  .  pantoprazole (PROTONIX) 40 MG tablet, Take 40 mg by mouth 2 (two) times daily. , Disp: , Rfl:  .  sodium chloride (OCEAN) 0.65 % SOLN nasal spray, Place 1 spray into both nostrils as needed for congestion., Disp: , Rfl:  .  traMADol (ULTRAM) 50 MG tablet, Take 1 tablet (50 mg total) by mouth every 6 (six) hours as needed. For cough, Disp: 60 tablet, Rfl: 1 .  UNABLE TO FIND, Med Name: Oracoat- 1 tablet prn, Disp: , Rfl:  .  ipratropium (ATROVENT) 0.03 % nasal spray, Place 2 sprays into the nose 4 (four) times daily., Disp: 30 mL, Rfl: 1

## 2015-11-05 NOTE — Assessment & Plan Note (Signed)
This continues to be a severe problem for her. I believe that it is due to acid reflux, postnasal drip, and irritable larynx syndrome. Despite taking acid reflux therapy she continues to have a sour taste in her mouth from time to time. Also, she continues to have postnasal drip symptoms. This is in the setting of taking a nasal steroid, antihistamine, and Singulair. She also has what sounds like vasomotor rhinitis symptoms as well. All of this likely contributes to her cough. Finally, I think she has irritable larynx syndrome which is perpetuated by continual coughing throughout the course the day.  Plan: Refer to gastroenterology for assessment of acid reflux in the setting of high-dose antacid therapy Continue taking sinus therapy, I'm going to add ipratropium nasal sprays for vasomotor rhinitis I'm going to ask her psychiatrist consider whether or not we could switch her to Elavil from duloxetine as this sometimes helps people who have irritable larynx syndrome Follow-up 3 months  Greater than 50% of time in a 27 minute visit were spent face-to-face.

## 2015-11-05 NOTE — Patient Instructions (Signed)
Take the ipratropium nose spray before meals, 1 spray each nostril Continue taking the fluticasone nose spray Continue taking Singulair Continue taking Zyrtec I am going to refer you to a gastroenterologist to evaluate your acid reflux I would like for your psychiatrist to consider whether or not you could take Elavil instead of Prozac as this can be helpful with chronic cough I will see you back in 3 months or sooner if needed

## 2015-11-14 ENCOUNTER — Telehealth: Payer: Self-pay | Admitting: Pulmonary Disease

## 2015-11-14 NOTE — Telephone Encounter (Signed)
LVM for pt to return.

## 2015-11-14 NOTE — Telephone Encounter (Signed)
Spoke with pt. She wants her last OV note sent to her PCP, Dr. Willey Blade. OV note has been routed to him. Nothing further was needed.

## 2015-11-14 NOTE — Telephone Encounter (Signed)
Pt returning call.[name] ° °

## 2015-11-26 ENCOUNTER — Ambulatory Visit (INDEPENDENT_AMBULATORY_CARE_PROVIDER_SITE_OTHER): Payer: Commercial Managed Care - HMO | Admitting: Gastroenterology

## 2015-11-26 ENCOUNTER — Telehealth: Payer: Self-pay | Admitting: Gastroenterology

## 2015-11-26 ENCOUNTER — Other Ambulatory Visit: Payer: Self-pay

## 2015-11-26 ENCOUNTER — Encounter: Payer: Self-pay | Admitting: Gastroenterology

## 2015-11-26 VITALS — BP 117/79 | HR 73 | Temp 97.5°F | Ht 61.0 in | Wt 168.6 lb

## 2015-11-26 DIAGNOSIS — K219 Gastro-esophageal reflux disease without esophagitis: Secondary | ICD-10-CM

## 2015-11-26 DIAGNOSIS — K5900 Constipation, unspecified: Secondary | ICD-10-CM

## 2015-11-26 MED ORDER — LINACLOTIDE 72 MCG PO CAPS: 72.0000 ug | ORAL_CAPSULE | Freq: Every day | ORAL | Status: DC

## 2015-11-26 MED ORDER — DEXLANSOPRAZOLE 60 MG PO CPDR: 60.0000 mg | DELAYED_RELEASE_CAPSULE | Freq: Every day | ORAL | Status: DC

## 2015-11-26 NOTE — Telephone Encounter (Signed)
I have mailed.

## 2015-11-26 NOTE — Progress Notes (Signed)
cc'ed to pcp °

## 2015-11-26 NOTE — Patient Instructions (Addendum)
For reflux: stop taking pantoprazole (Protonix). I am sending Dexilant to your pharmacy. If the copay is too much, just continue Protonix. We are completely out of samples.   For constipation: stop the laxative. Start taking Linzess 72  mcg once each morning, 30 minutes before breakfast. We have provided a free trial offer for 30 days.   We have scheduled you for an upper endoscopy with Dr. Gala Romney in the near future.   Food Choices for Gastroesophageal Reflux Disease, Adult When you have gastroesophageal reflux disease (GERD), the foods you eat and your eating habits are very important. Choosing the right foods can help ease the discomfort of GERD. WHAT GENERAL GUIDELINES DO I NEED TO FOLLOW?  Choose fruits, vegetables, whole grains, low-fat dairy products, and low-fat meat, fish, and poultry.  Limit fats such as oils, salad dressings, butter, nuts, and avocado.  Keep a food diary to identify foods that cause symptoms.  Avoid foods that cause reflux. These may be different for different people.  Eat frequent small meals instead of three large meals each day.  Eat your meals slowly, in a relaxed setting.  Limit fried foods.  Cook foods using methods other than frying.  Avoid drinking alcohol.  Avoid drinking large amounts of liquids with your meals.  Avoid bending over or lying down until 2-3 hours after eating. WHAT FOODS ARE NOT RECOMMENDED? The following are some foods and drinks that may worsen your symptoms: Vegetables Tomatoes. Tomato juice. Tomato and spaghetti sauce. Chili peppers. Onion and garlic. Horseradish. Fruits Oranges, grapefruit, and lemon (fruit and juice). Meats High-fat meats, fish, and poultry. This includes hot dogs, ribs, ham, sausage, salami, and bacon. Dairy Whole milk and chocolate milk. Sour cream. Cream. Butter. Ice cream. Cream cheese.  Beverages Coffee and tea, with or without caffeine. Carbonated beverages or energy drinks. Condiments Hot  sauce. Barbecue sauce.  Sweets/Desserts Chocolate and cocoa. Donuts. Peppermint and spearmint. Fats and Oils High-fat foods, including [place] fries and potato chips. Other Vinegar. Strong spices, such as black pepper, white pepper, red pepper, cayenne, curry powder, cloves, ginger, and chili powder. The items listed above may not be a complete list of foods and beverages to avoid. Contact your dietitian for more information.   This information is not intended to replace advice given to you by your health care provider. Make sure you discuss any questions you have with your health care provider.   Document Released: 07/05/2005 Document Revised: 07/26/2014 Document Reviewed: 05/09/2013 Elsevier Interactive Patient Education [employer].

## 2015-11-26 NOTE — Assessment & Plan Note (Signed)
Not ideally managed with OTC agents. Trial Linzess 72 mcg once daily. 30 day voucher supplied. Next colonoscopy in 2023 unless clinically indicated.

## 2015-11-26 NOTE — Telephone Encounter (Signed)
I COMPLETELY forgot to give her the GERD handout at time of visit. I am printing now. Can we mail this to her?

## 2015-11-26 NOTE — Assessment & Plan Note (Signed)
65 year old female with chronic cough, likely LPR, followed closely by Pulmonology and with persistent GERD symptoms despite PPI BID and Pepcid in the evening; she does recognize the need for diet and behavior modification, which we discussed at length today. Intermittent belching and regurgitation noted without dysphagia. No prior EGD. Would pursue EGD now, institute strict diet/behavior modification, and continue care with pulmonology.   Proceed with upper endoscopy in the near future with Dr. Gala Romney. The risks, benefits, and alternatives have been discussed in detail with patient. They have stated understanding and desire to proceed.  Phenergan 12.5 mg IV on call due to polypharmacy Trial of Dexilant instead of Protonix Weight loss described: goal of 140 ultimately Diet/behavior modification

## 2015-11-26 NOTE — Progress Notes (Signed)
Primary Care Physician:  Asencion Noble, MD Primary Gastroenterologist:  Dr. Gala Romney   Chief Complaint  Patient presents with  . Gastroesophageal Reflux    HPI:   [name] is a 65 y.o. female presenting today at the request of her pulmonologist (Dr. Lake Bells) secondary to GERD. Has vocal hoarseness, chronic cough, cyclical cough, rhinitis. Has a psychiatrist due to history of OCD. Pulmonology suggested perhaps changing from Prozac to Elavil as this may help with chronic cough. Taking Protonix BID and Pepcid each evening. Mouth is always dry. No dysphagia. Sometimes will "taste" her food after eating. Regurgitation. Has awful burping/belching. Has been seen by ENT (Dr. Benjamine Mola). Has tried/failed multiple PPIs. Prilosec, Nexium, OTC agent. Has been eating late at night but has just recently cut this out. Loves chocolate. No prior EGD.   Chronic constipation. Colonoscopy up-to-date and normal as of 2013. No rectal bleeding.     Past Medical History  Diagnosis Date  . Anxiety   . Depression     OCD  . CHF (congestive heart failure) (HCC)     swelling of feet & ankles  . Shortness of breath     with exertion  . GERD (gastroesophageal reflux disease)     severe  . Asthma     had 1 episode 1 year ago-no more problem  . OCD (obsessive compulsive disorder)   . Cough   . Yeast infection 04/09/2014    Past Surgical History  Procedure Laterality Date  . Tubal ligation    . Tonsillectomy    . Carpal tunnel release  03/22/2012    Procedure: CARPAL TUNNEL RELEASE;  Surgeon: Wynonia Sours, MD;  Location: [facility];  Service: Orthopedics;  Laterality: Right;  right carpal tunnel release  . Colonoscopy  06/19/2012    Dr. Rourk:normal rectum and colon     Current Outpatient Prescriptions  Medication Sig Dispense Refill  . cetirizine (ZYRTEC) 10 MG tablet Take 10 mg by mouth daily.    . famotidine (PEPCID) 20 MG tablet TAKE ONE TABLET BY MOUTH AT BEDTIME. 30 tablet 5  .  FLUoxetine (PROZAC) 20 MG capsule Take 1 capsule by mouth daily.    . fluticasone (FLONASE) 50 MCG/ACT nasal spray Place 2 sprays into both nostrils 2 (two) times daily. 16 g 5  . ipratropium (ATROVENT) 0.03 % nasal spray Place 2 sprays into the nose 4 (four) times daily. 30 mL 1  . montelukast (SINGULAIR) 10 MG tablet TAKE ONE TABLET BY MOUTH AT BEDTIME. 30 tablet 5  . Naproxen Sodium (ALEVE) 220 MG CAPS Take 1 capsule by mouth daily as needed (arthritis pain).    . pantoprazole (PROTONIX) 40 MG tablet Take 40 mg by mouth 2 (two) times daily.     . polyethylene glycol (MIRALAX / GLYCOLAX) packet Take 17 g by mouth daily as needed.    . sodium chloride (OCEAN) 0.65 % SOLN nasal spray Place 1 spray into both nostrils as needed for congestion.    . traMADol (ULTRAM) 50 MG tablet Take 1 tablet (50 mg total) by mouth every 6 (six) hours as needed. For cough 60 tablet 1  . UNABLE TO FIND Med Name: Oracoat- 1 tablet prn     No current facility-administered medications for this visit.    Allergies as of 11/26/2015 - Review Complete 11/26/2015  Allergen Reaction Noted  . Ciprofloxacin Other (See Comments) 02/22/2011  . Codeine Other (See Comments) 02/22/2011  . Sulfa antibiotics Other (See Comments) 02/22/2011  Family History  Problem Relation Age of Onset  . Colon cancer Neg Hx   . Asthma Mother   . Macular degeneration Mother   . Hypertension Mother   . Alzheimer's disease Father   . Other Sister     blood clots  . Other Son     enlarged spleen  . Diabetes Maternal Grandfather   . Diabetes Paternal Grandfather     Social History   Social History  . Marital Status: Married    Spouse Name: N/A  . Number of Children: 1  . Years of Education: N/A   Occupational History  . retired    Social History Main Topics  . Smoking status: Never Smoker   . Smokeless tobacco: Never Used  . Alcohol Use: No  . Drug Use: No  . Sexual Activity: Not Currently    Birth Control/ Protection:  Surgical   Other Topics Concern  . Not on file   Social History Narrative    Review of Systems: Gen: Denies any fever, chills, fatigue, weight loss, lack of appetite.  CV: Denies chest pain, heart palpitations, peripheral edema, syncope.  Resp: see HPI  GI: see HPI  GU : Denies urinary burning, urinary frequency, urinary hesitancy MS: Denies joint pain, muscle weakness, cramps, or limitation of movement.  Derm: Denies rash, itching, dry skin Psych: Denies depression, anxiety, memory loss, and confusion Heme: Denies bruising, bleeding, and enlarged lymph nodes.  Physical Exam: BP 117/79 mmHg  Pulse 73  Temp(Src) 97.5 F (36.4 C)  Ht 5\' 1"  (1.549 m)  Wt 168 lb 9.6 oz (76.476 kg)  BMI 31.87 kg/m2 General:   Alert and oriented. Pleasant and cooperative. Well-nourished and well-developed. Episode of severe coughing after laughing, requiring her to drink water. Subsided after a few minutes.  Head:  Normocephalic and atraumatic. Eyes:  Without icterus, sclera clear and conjunctiva pink.  Ears:  Normal auditory acuity. Nose:  No deformity, discharge,  or lesions. Mouth:  No deformity or lesions, oral mucosa pink.  Lungs:  Clear to auscultation bilaterally. No wheezes, rales, or rhonchi. No distress.  Heart:  S1, S2 present without murmurs appreciated.  Abdomen:  +BS, soft, non-tender and non-distended. No HSM noted. No guarding or rebound. No masses appreciated.  Rectal:  Deferred  Msk:  Symmetrical without gross deformities. Normal posture. Extremities:  Without  edema. Neurologic:  Alert and  oriented x4;  grossly normal neurologically. Psych:  Alert and cooperative. Normal mood and affect.

## 2015-11-27 ENCOUNTER — Telehealth: Payer: Self-pay | Admitting: Internal Medicine

## 2015-11-27 NOTE — Telephone Encounter (Signed)
Pt called today saying that she was here yesterday and forgot to get a diet sheet before she left. She asked if we would mail one to her.

## 2015-11-27 NOTE — Telephone Encounter (Signed)
[name] realized it yesterday after she left and has already mailed it to her. See Anna's phone note.

## 2015-12-01 DIAGNOSIS — F064 Anxiety disorder due to known physiological condition: Secondary | ICD-10-CM | POA: Diagnosis not present

## 2015-12-01 DIAGNOSIS — F332 Major depressive disorder, recurrent severe without psychotic features: Secondary | ICD-10-CM | POA: Diagnosis not present

## 2015-12-26 ENCOUNTER — Encounter (HOSPITAL_COMMUNITY)
Admission: RE | Disposition: A | Discharge status: Home / Self Care | Payer: Self-pay | Source: Ambulatory Visit | Attending: Internal Medicine

## 2015-12-26 ENCOUNTER — Encounter (HOSPITAL_COMMUNITY): Payer: Self-pay | Admitting: *Deleted

## 2015-12-26 ENCOUNTER — Ambulatory Visit (HOSPITAL_COMMUNITY)
Admission: RE | Admit: 2015-12-26 | Discharge: 2015-12-26 | Disposition: A | Discharge status: Home / Self Care | Payer: Commercial Managed Care - HMO | Source: Ambulatory Visit | Attending: Internal Medicine | Admitting: Internal Medicine

## 2015-12-26 DIAGNOSIS — F329 Major depressive disorder, single episode, unspecified: Secondary | ICD-10-CM | POA: Diagnosis not present

## 2015-12-26 DIAGNOSIS — Z7951 Long term (current) use of inhaled steroids: Secondary | ICD-10-CM | POA: Insufficient documentation

## 2015-12-26 DIAGNOSIS — Z79899 Other long term (current) drug therapy: Secondary | ICD-10-CM | POA: Diagnosis not present

## 2015-12-26 DIAGNOSIS — F429 Obsessive-compulsive disorder, unspecified: Secondary | ICD-10-CM | POA: Insufficient documentation

## 2015-12-26 DIAGNOSIS — K219 Gastro-esophageal reflux disease without esophagitis: Secondary | ICD-10-CM | POA: Diagnosis not present

## 2015-12-26 DIAGNOSIS — F419 Anxiety disorder, unspecified: Secondary | ICD-10-CM | POA: Insufficient documentation

## 2015-12-26 DIAGNOSIS — I509 Heart failure, unspecified: Secondary | ICD-10-CM | POA: Diagnosis not present

## 2015-12-26 HISTORY — PX: ESOPHAGOGASTRODUODENOSCOPY: SHX5428

## 2015-12-26 SURGERY — EGD (ESOPHAGOGASTRODUODENOSCOPY)
Anesthesia: Moderate Sedation

## 2015-12-26 MED ORDER — LIDOCAINE VISCOUS 2 % MT SOLN
OROMUCOSAL | Status: AC
  Filled 2015-12-26: qty 15

## 2015-12-26 MED ORDER — STERILE WATER FOR IRRIGATION IR SOLN
Status: DC | PRN
  Administered 2015-12-26: 10:00:00

## 2015-12-26 MED ORDER — MEPERIDINE HCL 100 MG/ML IJ SOLN
INTRAMUSCULAR | Status: AC
  Filled 2015-12-26: qty 2

## 2015-12-26 MED ORDER — ONDANSETRON HCL 4 MG/2ML IJ SOLN
INTRAMUSCULAR | Status: AC
  Filled 2015-12-26: qty 2

## 2015-12-26 MED ORDER — MIDAZOLAM HCL 5 MG/5ML IJ SOLN
INTRAMUSCULAR | Status: AC
  Filled 2015-12-26: qty 10

## 2015-12-26 MED ORDER — MEPERIDINE HCL 100 MG/ML IJ SOLN
INTRAMUSCULAR | Status: DC | PRN
  Administered 2015-12-26: 25 mg via INTRAVENOUS

## 2015-12-26 MED ORDER — MIDAZOLAM HCL 5 MG/5ML IJ SOLN
INTRAMUSCULAR | Status: DC | PRN
  Administered 2015-12-26: 2 mg via INTRAVENOUS

## 2015-12-26 MED ORDER — PROMETHAZINE HCL 25 MG/ML IJ SOLN
INTRAMUSCULAR | Status: AC
  Filled 2015-12-26: qty 1

## 2015-12-26 MED ORDER — LIDOCAINE VISCOUS 2 % MT SOLN
OROMUCOSAL | Status: DC | PRN
  Administered 2015-12-26: 3 mL via OROMUCOSAL

## 2015-12-26 MED ORDER — PROMETHAZINE HCL 25 MG/ML IJ SOLN
12.5000 mg | Freq: Once | INTRAMUSCULAR | Status: AC
  Administered 2015-12-26: 12.5 mg via INTRAVENOUS

## 2015-12-26 MED ORDER — ONDANSETRON HCL 4 MG/2ML IJ SOLN
INTRAMUSCULAR | Status: DC | PRN
  Administered 2015-12-26: 4 mg via INTRAVENOUS

## 2015-12-26 MED ORDER — SODIUM CHLORIDE 0.9 % IV SOLN
INTRAVENOUS | Status: DC
  Administered 2015-12-26: 09:00:00 via INTRAVENOUS

## 2015-12-26 MED ORDER — SODIUM CHLORIDE 0.9% FLUSH
INTRAVENOUS | Status: AC
  Filled 2015-12-26: qty 10

## 2015-12-26 NOTE — Discharge Instructions (Signed)
EGD Discharge instructions Please read the instructions outlined below and refer to this sheet in the next few weeks. These discharge instructions provide you with general information on caring for yourself after you leave the hospital. Your doctor may also give you specific instructions. While your treatment has been planned according to the most current medical practices available, unavoidable complications occasionally occur. If you have any problems or questions after discharge, please call your doctor. ACTIVITY  You may resume your regular activity but move at a slower pace for the next 24 hours.   Take frequent rest periods for the next 24 hours.   Walking will help expel (get rid of) the air and reduce the bloated feeling in your abdomen.   No driving for 24 hours (because of the anesthesia (medicine) used during the test).   You may shower.   Do not sign any important legal documents or operate any machinery for 24 hours (because of the anesthesia used during the test).  NUTRITION  Drink plenty of fluids.   You may resume your normal diet.   Begin with a light meal and progress to your normal diet.   Avoid alcoholic beverages for 24 hours or as instructed by your caregiver.  MEDICATIONS  You may resume your normal medications unless your caregiver tells you otherwise.  WHAT YOU CAN EXPECT TODAY  You may experience abdominal discomfort such as a feeling of fullness or gas pains.  FOLLOW-UP  Your doctor will discuss the results of your test with you.  SEEK IMMEDIATE MEDICAL ATTENTION IF ANY OF THE FOLLOWING OCCUR:  Excessive nausea (feeling sick to your stomach) and/or vomiting.   Severe abdominal pain and distention (swelling).   Trouble swallowing.   Temperature over 101 F (37.8 C).   Rectal bleeding or vomiting of blood.    GERD information provided  Continue Dexilant 60 mg daily  Office visit with [place] in 3 months      Gastroesophageal Reflux  Disease, Adult Normally, food travels down the esophagus and stays in the stomach to be digested. However, when a person has gastroesophageal reflux disease (GERD), food and stomach acid move back up into the esophagus. When this happens, the esophagus becomes sore and inflamed. Over time, GERD can create small holes (ulcers) in the lining of the esophagus.  CAUSES This condition is caused by a problem with the muscle between the esophagus and the stomach (lower esophageal sphincter, or LES). Normally, the LES muscle closes after food passes through the esophagus to the stomach. When the LES is weakened or abnormal, it does not close properly, and that allows food and stomach acid to go back up into the esophagus. The LES can be weakened by certain dietary substances, medicines, and medical conditions, including:  Tobacco use.  Pregnancy.  Having a hiatal hernia.  Heavy alcohol use.  Certain foods and beverages, such as coffee, chocolate, onions, and peppermint. RISK FACTORS This condition is more likely to develop in:  People who have an increased body weight.  People who have connective tissue disorders.  People who use NSAID medicines. SYMPTOMS Symptoms of this condition include:  Heartburn.  Difficult or painful swallowing.  The feeling of having a lump in the throat.  Abitter taste in the mouth.  Bad breath.  Having a large amount of saliva.  Having an upset or bloated stomach.  Belching.  Chest pain.  Shortness of breath or wheezing.  Ongoing (chronic) cough or a night-time cough.  Wearing away of tooth  enamel.  Weight loss. Different conditions can cause chest pain. Make sure to see your health care provider if you experience chest pain. DIAGNOSIS Your health care provider will take a medical history and perform a physical exam. To determine if you have mild or severe GERD, your health care provider may also monitor how you respond to treatment. You may  also have other tests, including:  An endoscopy toexamine your stomach and esophagus with a small camera.  A test thatmeasures the acidity level in your esophagus.  A test thatmeasures how much pressure is on your esophagus.  A barium swallow or modified barium swallow to show the shape, size, and functioning of your esophagus. TREATMENT The goal of treatment is to help relieve your symptoms and to prevent complications. Treatment for this condition may vary depending on how severe your symptoms are. Your health care provider may recommend:  Changes to your diet.  Medicine.  Surgery. HOME CARE INSTRUCTIONS Diet  Follow a diet as recommended by your health care provider. This may involve avoiding foods and drinks such as:  Coffee and tea (with or without caffeine).  Drinks that containalcohol.  Energy drinks and sports drinks.  Carbonated drinks or sodas.  Chocolate and cocoa.  Peppermint and mint flavorings.  Garlic and onions.  Horseradish.  Spicy and acidic foods, including peppers, chili powder, curry powder, vinegar, hot sauces, and barbecue sauce.  Citrus fruit juices and citrus fruits, such as oranges, lemons, and limes.  Tomato-based foods, such as red sauce, chili, salsa, and pizza with red sauce.  Fried and fatty foods, such as donuts, french fries, potato chips, and high-fat dressings.  High-fat meats, such as hot dogs and fatty cuts of red and white meats, such as rib eye steak, sausage, ham, and bacon.  High-fat dairy items, such as whole milk, butter, and cream cheese.  Eat small, frequent meals instead of large meals.  Avoid drinking large amounts of liquid with your meals.  Avoid eating meals during the 2-3 hours before bedtime.  Avoid lying down right after you eat.  Do not exercise right after you eat. General Instructions  Pay attention to any changes in your symptoms.  Take over-the-counter and prescription medicines only as told  by your health care provider. Do not take aspirin, ibuprofen, or other NSAIDs unless your health care provider told you to do so.  Do not use any tobacco products, including cigarettes, chewing tobacco, and e-cigarettes. If you need help quitting, ask your health care provider.  Wear loose-fitting clothing. Do not wear anything tight around your waist that causes pressure on your abdomen.  Raise (elevate) the head of your bed 6 inches (15cm).  Try to reduce your stress, such as with yoga or meditation. If you need help reducing stress, ask your health care provider.  If you are overweight, reduce your weight to an amount that is healthy for you. Ask your health care provider for guidance about a safe weight loss goal.  Keep all follow-up visits as told by your health care provider. This is important. SEEK MEDICAL CARE IF:  You have new symptoms.  You have unexplained weight loss.  You have difficulty swallowing, or it hurts to swallow.  You have wheezing or a persistent cough.  Your symptoms do not improve with treatment.  You have a hoarse voice. SEEK IMMEDIATE MEDICAL CARE IF:  You have pain in your arms, neck, jaw, teeth, or back.  You feel sweaty, dizzy, or light-headed.  You have  chest pain or shortness of breath.  You vomit and your vomit looks like blood or coffee grounds.  You faint.  Your stool is bloody or black.  You cannot swallow, drink, or eat.   This information is not intended to replace advice given to you by your health care provider. Make sure you discuss any questions you have with your health care provider.   Document Released: 04/14/2005 Document Revised: 03/26/2015 Document Reviewed: 10/30/2014 Elsevier Interactive Patient Education [employer].

## 2015-12-26 NOTE — H&P (View-Only) (Signed)
Primary Care Physician:  Asencion Noble, MD Primary Gastroenterologist:  Dr. Gala Romney   Chief Complaint  Patient presents with  . Gastroesophageal Reflux    HPI:   [name] is a 65 y.o. female presenting today at the request of her pulmonologist (Dr. Lake Bells) secondary to GERD. Has vocal hoarseness, chronic cough, cyclical cough, rhinitis. Has a psychiatrist due to history of OCD. Pulmonology suggested perhaps changing from Prozac to Elavil as this may help with chronic cough. Taking Protonix BID and Pepcid each evening. Mouth is always dry. No dysphagia. Sometimes will "taste" her food after eating. Regurgitation. Has awful burping/belching. Has been seen by ENT (Dr. Benjamine Mola). Has tried/failed multiple PPIs. Prilosec, Nexium, OTC agent. Has been eating late at night but has just recently cut this out. Loves chocolate. No prior EGD.   Chronic constipation. Colonoscopy up-to-date and normal as of 2013. No rectal bleeding.     Past Medical History  Diagnosis Date  . Anxiety   . Depression     OCD  . CHF (congestive heart failure) (HCC)     swelling of feet & ankles  . Shortness of breath     with exertion  . GERD (gastroesophageal reflux disease)     severe  . Asthma     had 1 episode 1 year ago-no more problem  . OCD (obsessive compulsive disorder)   . Cough   . Yeast infection 04/09/2014    Past Surgical History  Procedure Laterality Date  . Tubal ligation    . Tonsillectomy    . Carpal tunnel release  03/22/2012    Procedure: CARPAL TUNNEL RELEASE;  Surgeon: Wynonia Sours, MD;  Location: [facility];  Service: Orthopedics;  Laterality: Right;  right carpal tunnel release  . Colonoscopy  06/19/2012    Dr. Rourk:normal rectum and colon     Current Outpatient Prescriptions  Medication Sig Dispense Refill  . cetirizine (ZYRTEC) 10 MG tablet Take 10 mg by mouth daily.    . famotidine (PEPCID) 20 MG tablet TAKE ONE TABLET BY MOUTH AT BEDTIME. 30 tablet 5  .  FLUoxetine (PROZAC) 20 MG capsule Take 1 capsule by mouth daily.    . fluticasone (FLONASE) 50 MCG/ACT nasal spray Place 2 sprays into both nostrils 2 (two) times daily. 16 g 5  . ipratropium (ATROVENT) 0.03 % nasal spray Place 2 sprays into the nose 4 (four) times daily. 30 mL 1  . montelukast (SINGULAIR) 10 MG tablet TAKE ONE TABLET BY MOUTH AT BEDTIME. 30 tablet 5  . Naproxen Sodium (ALEVE) 220 MG CAPS Take 1 capsule by mouth daily as needed (arthritis pain).    . pantoprazole (PROTONIX) 40 MG tablet Take 40 mg by mouth 2 (two) times daily.     . polyethylene glycol (MIRALAX / GLYCOLAX) packet Take 17 g by mouth daily as needed.    . sodium chloride (OCEAN) 0.65 % SOLN nasal spray Place 1 spray into both nostrils as needed for congestion.    . traMADol (ULTRAM) 50 MG tablet Take 1 tablet (50 mg total) by mouth every 6 (six) hours as needed. For cough 60 tablet 1  . UNABLE TO FIND Med Name: [place]- 1 tablet prn     No current facility-administered medications for this visit.    Allergies as of 11/26/2015 - Review Complete 11/26/2015  Allergen Reaction Noted  . Ciprofloxacin Other (See Comments) 02/22/2011  . Codeine Other (See Comments) 02/22/2011  . Sulfa antibiotics Other (See Comments) 02/22/2011  Family History  Problem Relation Age of Onset  . Colon cancer Neg Hx   . Asthma Mother   . Macular degeneration Mother   . Hypertension Mother   . Alzheimer's disease Father   . Other Sister     blood clots  . Other Son     enlarged spleen  . Diabetes Maternal Grandfather   . Diabetes Paternal Grandfather     Social History   Social History  . Marital Status: Married    Spouse Name: N/A  . Number of Children: 1  . Years of Education: N/A   Occupational History  . retired    Social History Main Topics  . Smoking status: Never Smoker   . Smokeless tobacco: Never Used  . Alcohol Use: No  . Drug Use: No  . Sexual Activity: Not Currently    Birth Control/ Protection:  Surgical   Other Topics Concern  . Not on file   Social History Narrative    Review of Systems: Gen: Denies any fever, chills, fatigue, weight loss, lack of appetite.  CV: Denies chest pain, heart palpitations, peripheral edema, syncope.  Resp: see HPI  GI: see HPI  GU : Denies urinary burning, urinary frequency, urinary hesitancy MS: Denies joint pain, muscle weakness, cramps, or limitation of movement.  Derm: Denies rash, itching, dry skin Psych: Denies depression, anxiety, memory loss, and confusion Heme: Denies bruising, bleeding, and enlarged lymph nodes.  Physical Exam: BP 117/79 mmHg  Pulse 73  Temp(Src) 97.5 F (36.4 C)  Ht 5\' 1"  (1.549 m)  Wt 168 lb 9.6 oz (76.476 kg)  BMI 31.87 kg/m2 General:   Alert and oriented. Pleasant and cooperative. Well-nourished and well-developed. Episode of severe coughing after laughing, requiring her to drink water. Subsided after a few minutes.  Head:  Normocephalic and atraumatic. Eyes:  Without icterus, sclera clear and conjunctiva pink.  Ears:  Normal auditory acuity. Nose:  No deformity, discharge,  or lesions. Mouth:  No deformity or lesions, oral mucosa pink.  Lungs:  Clear to auscultation bilaterally. No wheezes, rales, or rhonchi. No distress.  Heart:  S1, S2 present without murmurs appreciated.  Abdomen:  +BS, soft, non-tender and non-distended. No HSM noted. No guarding or rebound. No masses appreciated.  Rectal:  Deferred  Msk:  Symmetrical without gross deformities. Normal posture. Extremities:  Without  edema. Neurologic:  Alert and  oriented x4;  grossly normal neurologically. Psych:  Alert and cooperative. Normal mood and affect.

## 2015-12-26 NOTE — Interval H&P Note (Signed)
History and Physical Interval Note:  12/26/2015 10:11 AM  [name]  has presented today for surgery, with the diagnosis of GERD  The various methods of treatment have been discussed with the patient and family. After consideration of risks, benefits and other options for treatment, the patient has consented to  Procedure(s) with comments: ESOPHAGOGASTRODUODENOSCOPY (EGD) (N/A) - 1000 as a surgical intervention .  The patient's history has been reviewed, patient examined, no change in status, stable for surgery.  I have reviewed the patient's chart and labs.  Questions were answered to the patient's satisfaction.     [name]  No change. Patient denies dysphagia. Diagnostic EGD per plan.  The risks, benefits, limitations, alternatives and imponderables have been reviewed with the patient. Potential for esophageal dilation, biopsy, etc. have also been reviewed.  Questions have been answered. All parties agreeable.

## 2015-12-26 NOTE — Op Note (Signed)
[facility] Patient Name: [name] Procedure Date: 12/26/2015 10:00 AM MRN: [id number] Date of Birth: 15-Dec-1950 Attending MD: Norvel Richards , MD CSN: [id number] Age: 65 Admit Type: Outpatient Procedure:                Upper GI endoscopy Indications:              Esophageal reflux symptoms that persist despite                            appropriate therapy Providers:                Norvel Richards, MD, Gwenlyn Fudge, RN, Randa Spike, Technician Referring MD:              Medicines:                Midazolam 2 mg IV, Meperidine 25 mg IV, Ondansetron                            4 mg IV, Promethazine [id number] mg IV Complications:            No immediate complications. Estimated Blood Loss:     Estimated blood loss: none. Procedure:                Pre-Anesthesia Assessment:                           - Prior to the procedure, a History and Physical                            was performed, and patient medications and                            allergies were reviewed. The patient's tolerance of                            previous anesthesia was also reviewed. The risks                            and benefits of the procedure and the sedation                            options and risks were discussed with the patient.                            All questions were answered, and informed consent                            was obtained. Prior Anticoagulants: The patient has                            taken no previous anticoagulant or antiplatelet  agents. ASA Grade Assessment: II - A patient with                            mild systemic disease. After reviewing the risks                            and benefits, the patient was deemed in                            satisfactory condition to undergo the procedure.                           After obtaining informed consent, the endoscope was                            passed  under direct vision. Throughout the                            procedure, the patient's blood pressure, pulse, and                            oxygen saturations were monitored continuously. The                            [id number]) scope was introduced through the                            mouth, and advanced to the second part of duodenum.                            The upper GI endoscopy was accomplished without                            difficulty. The patient tolerated the procedure                            well. Scope In: 10:20:08 AM Scope Out: 10:24:48 AM Total Procedure Duration: 0 hours 4 minutes 40 seconds  Findings:      The second portion of the duodenum was normal.      The entire examined stomach was normal. No hiatal hernia.      The examined esophagus was normal. Impression:               - Normal second portion of the duodenum.                           - Normal stomach.                           - Normal esophagus.                           - No specimens collected. Moderate Sedation:      Moderate (conscious) sedation was administered by the endoscopy nurse       and supervised by  the endoscopist. The following parameters were       monitored: oxygen saturation, heart rate, blood pressure, respiratory       rate, EKG, adequacy of pulmonary ventilation, and response to care.       Total physician intraservice time was 11 minutes. Recommendation:           - Patient has a contact number available for                            emergencies. The signs and symptoms of potential                            delayed complications were discussed with the                            patient. Return to normal activities tomorrow.                            Written discharge instructions were provided to the                            patient.                           - Advance diet as tolerated.                           - Continue present medications ncluding Exelon  60                            mg daily. office visit with [place] in 12 weeks Procedure Code(s):        --- Professional ---                           [phone], Esophagogastroduodenoscopy, flexible,                            transoral; diagnostic, including collection of                            specimen(s) by brushing or washing, when performed                            (separate procedure)                           99152, Moderate sedation services provided by the                            same physician or other qualified health care                            professional performing the diagnostic or                            therapeutic service that the sedation supports,  requiring the presence of an independent trained                            observer to assist in the monitoring of the                            patient's level of consciousness and physiological                            status; initial 15 minutes of intraservice time,                            patient age 42 years or older Diagnosis Code(s):        --- Professional ---                           K21.9, Gastro-esophageal reflux disease without                            esophagitis CPT copyright 2016 American Medical Association. All rights reserved. The codes documented in this report are preliminary and upon coder review may  be revised to meet current compliance requirements. Cristopher Estimable. Windsor Zirkelbach, MD Norvel Richards, MD 12/26/2015 2:10:07 PM This report has been signed electronically. Number of Addenda: 0

## 2015-12-29 ENCOUNTER — Encounter (HOSPITAL_COMMUNITY): Payer: Self-pay | Admitting: Internal Medicine

## 2016-01-01 ENCOUNTER — Other Ambulatory Visit: Payer: Self-pay | Admitting: Pulmonary Disease

## 2016-01-02 ENCOUNTER — Other Ambulatory Visit: Payer: Self-pay | Admitting: Pulmonary Disease

## 2016-01-12 ENCOUNTER — Telehealth: Payer: Self-pay | Admitting: Internal Medicine

## 2016-01-12 NOTE — Telephone Encounter (Signed)
Pt is calling about the linzess 72 mg and it is going to $45.00. She said that she could not afford that. Please advise

## 2016-01-12 NOTE — Telephone Encounter (Signed)
[phone]  PATIENT CALLED AND STATED THAT SHE HAS A QUESTION ABOUT HER MEDICATIONS.  HAS A COUPON

## 2016-01-13 NOTE — Telephone Encounter (Signed)
Routing to AS 

## 2016-01-13 NOTE — Telephone Encounter (Signed)
Patient called to follow-up on her question in regards to her Linzess and stated she can be reached at [phone]

## 2016-01-14 MED ORDER — LUBIPROSTONE 8 MCG PO CAPS: 8.0000 ug | ORAL_CAPSULE | Freq: Two times a day (BID) | ORAL | Status: DC

## 2016-01-14 NOTE — Addendum Note (Signed)
Addended by: Orvil Feil on: 01/14/2016 03:20 PM   Modules accepted: Orders

## 2016-01-14 NOTE — Telephone Encounter (Signed)
I will send in Amitiza 53mcg po BID. Make sure to take with food. Hopefully, this will be better covered.

## 2016-01-15 NOTE — Telephone Encounter (Signed)
Pt is aware.  

## 2016-01-15 NOTE — Telephone Encounter (Signed)
Pt called back, she checked with the pharmacy and she cannot afford the amitiza either.

## 2016-01-21 NOTE — Telephone Encounter (Signed)
Unfortunately, we are not left with much else. Take Miralax daily to BID as needed for constipation.

## 2016-01-21 NOTE — Telephone Encounter (Signed)
LMOM to call.

## 2016-01-22 NOTE — Telephone Encounter (Signed)
LMOM and mailed a letter with the recommendation.

## 2016-02-05 ENCOUNTER — Ambulatory Visit: Payer: Commercial Managed Care - HMO | Admitting: Pulmonary Disease

## 2016-02-10 DIAGNOSIS — F428 Other obsessive-compulsive disorder: Secondary | ICD-10-CM | POA: Diagnosis not present

## 2016-02-10 DIAGNOSIS — R05 Cough: Secondary | ICD-10-CM | POA: Diagnosis not present

## 2016-02-17 DIAGNOSIS — F064 Anxiety disorder due to known physiological condition: Secondary | ICD-10-CM | POA: Diagnosis not present

## 2016-02-27 DIAGNOSIS — N3000 Acute cystitis without hematuria: Secondary | ICD-10-CM | POA: Diagnosis not present

## 2016-03-19 ENCOUNTER — Ambulatory Visit (INDEPENDENT_AMBULATORY_CARE_PROVIDER_SITE_OTHER): Payer: Commercial Managed Care - HMO | Admitting: Gastroenterology

## 2016-03-19 ENCOUNTER — Encounter: Payer: Self-pay | Admitting: Gastroenterology

## 2016-03-19 VITALS — BP 105/71 | HR 59 | Temp 98.0°F | Ht 61.0 in | Wt 170.8 lb

## 2016-03-19 DIAGNOSIS — R05 Cough: Secondary | ICD-10-CM

## 2016-03-19 DIAGNOSIS — R059 Cough, unspecified: Secondary | ICD-10-CM

## 2016-03-19 MED ORDER — ALBUTEROL SULFATE HFA 108 (90 BASE) MCG/ACT IN AERS
2.0000 | INHALATION_SPRAY | Freq: Four times a day (QID) | RESPIRATORY_TRACT | 2 refills | Status: DC | PRN
Start: 2016-03-19 — End: 2017-11-29

## 2016-03-19 NOTE — Patient Instructions (Signed)
Decrease the Protonix to once daily, 30 minutes before breakfast.   I sent in the inhaler to your pharmacy. Only use this as needed. If this doesn't help, then you can stop.  We will see you in 1 year!

## 2016-03-19 NOTE — Progress Notes (Signed)
Referring Provider: Asencion Noble, MD Primary Care Physician:  Asencion Noble, MD  Primary GI: Dr. Gala Romney   Chief Complaint  Patient presents with  . Follow-up    doing ok    HPI:   [name] is a 65 y.o. female presenting today with a history of GERD and constipation. Recently underwent EGD due to persistent GERD symptoms despite PPI BID and Pepcid in the evening. EGD completely normal. Followed closely by pulmonology. Colonoscopy next due in 2023. Given Linzess 72 mcg at last appt but both this and Amitiza was too expensive.   Hot, laughs, bends down, sinus drainage: coughs. Taking Protonix BID. Goes into the grocery store and starts coughing more. When someone sprays something, it ignites it. Was told she went to an allergy specialist and was told the only thing she was allergic to was mold. Still has the cough but it is better. No reflux symptoms. No vocal hoarseness. Has seen Dr. Benjamine Mola. Miralax for constipation, doing ok.   Past Medical History:  Diagnosis Date  . Anxiety   . Asthma    had 1 episode 1 year ago-no more problem  . CHF (congestive heart failure) (HCC)    swelling of feet & ankles  . Cough   . Depression    OCD  . GERD (gastroesophageal reflux disease)    severe  . OCD (obsessive compulsive disorder)   . Shortness of breath    with exertion  . Yeast infection 04/09/2014    Past Surgical History:  Procedure Laterality Date  . CARPAL TUNNEL RELEASE  03/22/2012   Procedure: CARPAL TUNNEL RELEASE;  Surgeon: Wynonia Sours, MD;  Location: [place];  Service: Orthopedics;  Laterality: Right;  right carpal tunnel release  . COLONOSCOPY  06/19/2012   Dr. Rourk:normal rectum and colon   . ESOPHAGOGASTRODUODENOSCOPY N/A 12/26/2015   Dr. Gala Romney: normal   . TONSILLECTOMY    . TUBAL LIGATION      Current Outpatient Prescriptions  Medication Sig Dispense Refill  . acetaminophen (TYLENOL) 500 MG tablet Take 1,000 mg by mouth every 6 (six) hours as needed.      . cetirizine (ZYRTEC) 10 MG tablet Take 10 mg by mouth daily.    . famotidine (PEPCID) 20 MG tablet TAKE ONE TABLET BY MOUTH AT BEDTIME. 30 tablet 5  . FLUoxetine (PROZAC) 20 MG capsule Take 1 capsule by mouth daily.    . fluticasone (FLONASE) 50 MCG/ACT nasal spray Place 2 sprays into both nostrils 2 (two) times daily. 16 g 5  . ipratropium (ATROVENT) 0.03 % nasal spray SPRAY 2 SPRAYS INTO EACH NOSTRIL FOUR TIMES DAILY 30 mL 5  . montelukast (SINGULAIR) 10 MG tablet TAKE ONE TABLET BY MOUTH AT BEDTIME. 30 tablet 5  . pantoprazole (PROTONIX) 40 MG tablet Take 40 mg by mouth 2 (two) times daily.     . polyethylene glycol (MIRALAX / GLYCOLAX) packet Take 17 g by mouth daily as needed for mild constipation.     . sodium chloride (OCEAN) 0.65 % SOLN nasal spray Place 1 spray into both nostrils as needed for congestion.    . traMADol (ULTRAM) 50 MG tablet Take 1 tablet (50 mg total) by mouth every 6 (six) hours as needed. For cough 60 tablet 1  . albuterol (PROVENTIL HFA;VENTOLIN HFA) 108 (90 Base) MCG/ACT inhaler Inhale 2 puffs into the lungs every 6 (six) hours as needed for wheezing or shortness of breath. 1 Inhaler 2   No current facility-administered  medications for this visit.     Allergies as of 03/19/2016 - Review Complete 03/19/2016  Allergen Reaction Noted  . Ciprofloxacin Other (See Comments) 02/22/2011  . Codeine Other (See Comments) 02/22/2011  . Sulfa antibiotics Other (See Comments) 02/22/2011    Family History  Problem Relation Age of Onset  . Asthma Mother   . Macular degeneration Mother   . Hypertension Mother   . Alzheimer's disease Father   . Other Sister     blood clots  . Other Son     enlarged spleen  . Diabetes Maternal Grandfather   . Diabetes Paternal Grandfather   . Colon cancer Neg Hx     Social History   Social History  . Marital status: Widowed    Spouse name: N/A  . Number of children: 1  . Years of education: N/A   Occupational History  .  retired    Social History Main Topics  . Smoking status: Never Smoker  . Smokeless tobacco: Never Used  . Alcohol use No  . Drug use: No  . Sexual activity: Not Currently    Birth control/ protection: Surgical   Other Topics Concern  . None   Social History Narrative  . None    Review of Systems: As mentioned in HPI   Physical Exam: BP 105/71   Pulse (!) 59   Temp 98 F (36.7 C) (Oral)   Ht 5\' 1"  (1.549 m)   Wt 170 lb 12.8 oz (77.5 kg)   BMI 32.27 kg/m  General:   Alert and oriented. No distress noted. Pleasant and cooperative.  Head:  Normocephalic and atraumatic. Eyes:  Conjuctiva clear without scleral icterus. Mouth:  Oral mucosa pink and moist.  Abdomen:  +BS, soft, non-tender and non-distended. No rebound or guarding. No HSM or masses noted. Extremities:  Without edema. Neurologic:  Alert and  oriented x4 Psych:  Alert and cooperative. Normal mood and affect.

## 2016-03-26 NOTE — Assessment & Plan Note (Signed)
GERD well controlled currently, currently on PPI BID and EGD completely normal. Notes coughing triggered by temperature change, laughing, bending, sinus drainage. Has been seen by pulmonology, ENT, and GI thus far. At this point, will trial PPI at once daily dosing and see how she does. Clinically, she seems to be doing better than last visit. It is encouraging that her upper GI tract is completely normal. Will see her back in 1 year and would recommend lowest dosing of PPI that is necessary. GERD dietary and behavior modifications discussed.

## 2016-03-29 ENCOUNTER — Telehealth: Payer: Self-pay | Admitting: Internal Medicine

## 2016-03-29 DIAGNOSIS — F411 Generalized anxiety disorder: Secondary | ICD-10-CM | POA: Diagnosis not present

## 2016-03-29 NOTE — Telephone Encounter (Signed)
Pt seen recently by AB and has a question for the nurse. Call transferred to [facility]

## 2016-03-29 NOTE — Progress Notes (Signed)
CC'ED TO PCP 

## 2016-03-29 NOTE — Telephone Encounter (Signed)
pts message said she discussed with you about getting allergy shots while she was in the office. Pt said that her cough is getting better and the protonix seems to be helping but she still has some cough and wants to know what you think about her getting the shots?

## 2016-03-29 NOTE — Telephone Encounter (Signed)
I think this is something she could discuss with the allergy specialist. If she has other symptoms related to allergies, it could be worth a discussion. It certainly would not hurt to have a consult with the allergy specialist.

## 2016-03-30 ENCOUNTER — Telehealth: Payer: Self-pay | Admitting: Internal Medicine

## 2016-03-30 NOTE — Telephone Encounter (Signed)
Pt was returning a call from [place]. Please call patient back at [phone]

## 2016-03-30 NOTE — Telephone Encounter (Signed)
Tried to call pt- NA- LMOM with recommendations.  

## 2016-03-31 ENCOUNTER — Ambulatory Visit: Payer: Commercial Managed Care - HMO | Admitting: Gastroenterology

## 2016-04-05 NOTE — Telephone Encounter (Signed)
See other phone note

## 2016-04-05 NOTE — Telephone Encounter (Signed)
I spoke with the pt and she will contact Dr.Fagan to send her to an allergist.

## 2016-06-04 DIAGNOSIS — J3089 Other allergic rhinitis: Secondary | ICD-10-CM | POA: Diagnosis not present

## 2016-06-04 DIAGNOSIS — K219 Gastro-esophageal reflux disease without esophagitis: Secondary | ICD-10-CM | POA: Diagnosis not present

## 2016-06-04 DIAGNOSIS — R0600 Dyspnea, unspecified: Secondary | ICD-10-CM | POA: Diagnosis not present

## 2016-06-04 DIAGNOSIS — R05 Cough: Secondary | ICD-10-CM | POA: Diagnosis not present

## 2016-06-24 DIAGNOSIS — Z23 Encounter for immunization: Secondary | ICD-10-CM | POA: Diagnosis not present

## 2016-06-24 DIAGNOSIS — F428 Other obsessive-compulsive disorder: Secondary | ICD-10-CM | POA: Diagnosis not present

## 2016-06-24 DIAGNOSIS — R05 Cough: Secondary | ICD-10-CM | POA: Diagnosis not present

## 2016-06-28 DIAGNOSIS — F411 Generalized anxiety disorder: Secondary | ICD-10-CM | POA: Diagnosis not present

## 2016-07-15 DIAGNOSIS — R05 Cough: Secondary | ICD-10-CM | POA: Diagnosis not present

## 2016-08-16 DIAGNOSIS — F064 Anxiety disorder due to known physiological condition: Secondary | ICD-10-CM | POA: Diagnosis not present

## 2016-09-27 DIAGNOSIS — F411 Generalized anxiety disorder: Secondary | ICD-10-CM | POA: Diagnosis not present

## 2016-09-27 DIAGNOSIS — F332 Major depressive disorder, recurrent severe without psychotic features: Secondary | ICD-10-CM | POA: Diagnosis not present

## 2016-10-13 ENCOUNTER — Other Ambulatory Visit: Payer: Self-pay | Admitting: Pulmonary Disease

## 2016-10-22 DIAGNOSIS — K589 Irritable bowel syndrome without diarrhea: Secondary | ICD-10-CM | POA: Diagnosis not present

## 2016-10-22 DIAGNOSIS — Z79899 Other long term (current) drug therapy: Secondary | ICD-10-CM | POA: Diagnosis not present

## 2016-10-22 DIAGNOSIS — F429 Obsessive-compulsive disorder, unspecified: Secondary | ICD-10-CM | POA: Diagnosis not present

## 2016-10-22 DIAGNOSIS — K219 Gastro-esophageal reflux disease without esophagitis: Secondary | ICD-10-CM | POA: Diagnosis not present

## 2016-11-09 DIAGNOSIS — R05 Cough: Secondary | ICD-10-CM | POA: Diagnosis not present

## 2016-11-09 DIAGNOSIS — K210 Gastro-esophageal reflux disease with esophagitis: Secondary | ICD-10-CM | POA: Diagnosis not present

## 2016-11-09 DIAGNOSIS — Z6832 Body mass index (BMI) 32.0-32.9, adult: Secondary | ICD-10-CM | POA: Diagnosis not present

## 2016-11-15 DIAGNOSIS — J219 Acute bronchiolitis, unspecified: Secondary | ICD-10-CM | POA: Diagnosis not present

## 2016-11-18 ENCOUNTER — Ambulatory Visit (HOSPITAL_COMMUNITY)
Admission: RE | Admit: 2016-11-18 | Discharge: 2016-11-18 | Disposition: A | Discharge status: Home / Self Care | Payer: Medicare HMO | Source: Ambulatory Visit | Attending: Internal Medicine | Admitting: Internal Medicine

## 2016-11-18 ENCOUNTER — Other Ambulatory Visit (HOSPITAL_COMMUNITY): Payer: Self-pay | Admitting: Internal Medicine

## 2016-11-18 DIAGNOSIS — R05 Cough: Secondary | ICD-10-CM | POA: Insufficient documentation

## 2016-11-18 DIAGNOSIS — R059 Cough, unspecified: Secondary | ICD-10-CM

## 2016-11-25 ENCOUNTER — Other Ambulatory Visit: Payer: Self-pay | Admitting: Pulmonary Disease

## 2016-12-01 ENCOUNTER — Other Ambulatory Visit (HOSPITAL_COMMUNITY)
Admission: RE | Admit: 2016-12-01 | Discharge: 2016-12-01 | Disposition: A | Discharge status: Home / Self Care | Payer: Medicare HMO | Source: Ambulatory Visit | Attending: Urology | Admitting: Urology

## 2016-12-01 ENCOUNTER — Ambulatory Visit (INDEPENDENT_AMBULATORY_CARE_PROVIDER_SITE_OTHER): Payer: Medicare HMO | Admitting: Urology

## 2016-12-01 DIAGNOSIS — N3020 Other chronic cystitis without hematuria: Secondary | ICD-10-CM

## 2016-12-01 LAB — BASIC METABOLIC PANEL
Anion gap: 7 (ref 5–15)
BUN: 16 mg/dL (ref 6–20)
CALCIUM: 9.5 mg/dL (ref 8.9–10.3)
CO2: 29 mmol/L (ref 22–32)
CREATININE: 0.88 mg/dL (ref 0.44–1.00)
Chloride: 102 mmol/L (ref 101–111)
GFR calc Af Amer: >60 mL/min (ref >60)
GFR calc non Af Amer: >60 mL/min (ref >60)
GLUCOSE: 97 mg/dL (ref 65–99)
Potassium: 4.4 mmol/L (ref 3.5–5.1)
Sodium: 138 mmol/L (ref 135–145)

## 2016-12-09 DIAGNOSIS — N3000 Acute cystitis without hematuria: Secondary | ICD-10-CM | POA: Diagnosis not present

## 2016-12-24 ENCOUNTER — Other Ambulatory Visit: Payer: Self-pay | Admitting: Urology

## 2016-12-24 DIAGNOSIS — N3020 Other chronic cystitis without hematuria: Secondary | ICD-10-CM

## 2017-01-04 ENCOUNTER — Encounter (HOSPITAL_COMMUNITY): Payer: Self-pay

## 2017-01-04 ENCOUNTER — Ambulatory Visit (HOSPITAL_COMMUNITY)
Admission: RE | Admit: 2017-01-04 | Discharge: 2017-01-04 | Disposition: A | Discharge status: Home / Self Care | Payer: Medicare HMO | Source: Ambulatory Visit | Attending: Urology | Admitting: Urology

## 2017-01-04 DIAGNOSIS — N3289 Other specified disorders of bladder: Secondary | ICD-10-CM | POA: Diagnosis not present

## 2017-01-04 DIAGNOSIS — N3020 Other chronic cystitis without hematuria: Secondary | ICD-10-CM | POA: Insufficient documentation

## 2017-01-04 DIAGNOSIS — N390 Urinary tract infection, site not specified: Secondary | ICD-10-CM | POA: Diagnosis not present

## 2017-01-04 MED ORDER — IOPAMIDOL (ISOVUE-300) INJECTION 61%: 100.0000 mL | Freq: Once | INTRAVENOUS | Status: DC | PRN

## 2017-01-04 MED ORDER — IOPAMIDOL (ISOVUE-300) INJECTION 61%
150.0000 mL | Freq: Once | INTRAVENOUS | Status: AC | PRN
  Administered 2017-01-04: 125 mL via INTRAVENOUS

## 2017-02-02 ENCOUNTER — Ambulatory Visit (INDEPENDENT_AMBULATORY_CARE_PROVIDER_SITE_OTHER): Payer: Medicare HMO | Admitting: Urology

## 2017-02-02 DIAGNOSIS — N3020 Other chronic cystitis without hematuria: Secondary | ICD-10-CM

## 2017-03-03 ENCOUNTER — Encounter: Payer: Self-pay | Admitting: Internal Medicine

## 2017-03-14 DIAGNOSIS — F064 Anxiety disorder due to known physiological condition: Secondary | ICD-10-CM | POA: Diagnosis not present

## 2017-03-14 DIAGNOSIS — F411 Generalized anxiety disorder: Secondary | ICD-10-CM | POA: Diagnosis not present

## 2017-03-16 DIAGNOSIS — F3340 Major depressive disorder, recurrent, in remission, unspecified: Secondary | ICD-10-CM | POA: Diagnosis not present

## 2017-03-16 DIAGNOSIS — K219 Gastro-esophageal reflux disease without esophagitis: Secondary | ICD-10-CM | POA: Diagnosis not present

## 2017-03-16 DIAGNOSIS — R05 Cough: Secondary | ICD-10-CM | POA: Diagnosis not present

## 2017-03-31 ENCOUNTER — Other Ambulatory Visit: Payer: Self-pay | Admitting: Gastroenterology

## 2017-05-06 ENCOUNTER — Encounter: Payer: Self-pay | Admitting: Gastroenterology

## 2017-05-06 ENCOUNTER — Ambulatory Visit (INDEPENDENT_AMBULATORY_CARE_PROVIDER_SITE_OTHER): Payer: Medicare HMO | Admitting: Gastroenterology

## 2017-05-06 VITALS — BP 116/66 | HR 59 | Temp 97.7°F | Ht 61.5 in | Wt 158.2 lb

## 2017-05-06 DIAGNOSIS — R05 Cough: Secondary | ICD-10-CM

## 2017-05-06 DIAGNOSIS — R1312 Dysphagia, oropharyngeal phase: Secondary | ICD-10-CM

## 2017-05-06 DIAGNOSIS — R059 Cough, unspecified: Secondary | ICD-10-CM

## 2017-05-06 MED ORDER — PANTOPRAZOLE SODIUM 40 MG PO TBEC: 40.0000 mg | DELAYED_RELEASE_TABLET | Freq: Every day | ORAL | 3 refills | Status: DC

## 2017-05-06 NOTE — Patient Instructions (Addendum)
Continue Protonix once each morning.   We are setting you up for the xray of your esophagus   Further recommendations to follow!

## 2017-05-06 NOTE — Progress Notes (Signed)
Referring Provider: Asencion Noble, MD Primary Care Physician:  Asencion Noble, MD Primary GI: Dr. Gala Romney   Chief Complaint  Patient presents with  . Follow-up    Still has cough. GERD doing okay    HPI:   [name] is a 66 y.o. female presenting today with a history of GERD and constipation. EGD normal last year. Colonoscopy due in 2023. Due to chronic cough, she has been evaluated by allergy specialist, ENT, and GI. Cough historically triggered by temperature changes, laughing, bending, sinus drainage.   Still with intermittent coughing. When bending over, causes coughing. Will get a tickle in her throat at times as well. Works part-time and if she gets around perfumes and detergents, coughing will start up. Sometimes when eating, she feels like she is getting strangled at times and points to her neck area. Humidity bothers her. Saw an allergist in [place] and has been on Breo (inhaler). Saw Dr. Willey Blade about a month or so ago. Protonix once a day. Tired of seeing specialists.   Past Medical History:  Diagnosis Date  . Anxiety   . Asthma    had 1 episode 1 year ago-no more problem  . CHF (congestive heart failure) (HCC)    swelling of feet & ankles  . Cough   . Depression    OCD  . GERD (gastroesophageal reflux disease)    severe  . OCD (obsessive compulsive disorder)   . Shortness of breath    with exertion  . Yeast infection 04/09/2014    Past Surgical History:  Procedure Laterality Date  . CARPAL TUNNEL RELEASE  03/22/2012   Procedure: CARPAL TUNNEL RELEASE;  Surgeon: Wynonia Sours, MD;  Location: [facility];  Service: Orthopedics;  Laterality: Right;  right carpal tunnel release  . COLONOSCOPY  06/19/2012   Dr. Rourk:normal rectum and colon   . ESOPHAGOGASTRODUODENOSCOPY N/A 12/26/2015   Dr. Gala Romney: normal   . TONSILLECTOMY    . TUBAL LIGATION      Current Outpatient Prescriptions  Medication Sig Dispense Refill  . acetaminophen (TYLENOL) 500 MG tablet Take  1,000 mg by mouth every 6 (six) hours as needed.    Marland Kitchen albuterol (PROVENTIL HFA;VENTOLIN HFA) 108 (90 Base) MCG/ACT inhaler Inhale 2 puffs into the lungs every 6 (six) hours as needed for wheezing or shortness of breath. 1 Inhaler 2  . cetirizine (ZYRTEC) 10 MG tablet Take 10 mg by mouth daily.    . famotidine (PEPCID) 20 MG tablet TAKE ONE TABLET BY MOUTH AT BEDTIME. 30 tablet 5  . FLUoxetine (PROZAC) 20 MG capsule Take 1 capsule by mouth daily.    . fluticasone (FLONASE) 50 MCG/ACT nasal spray Place 2 sprays into both nostrils 2 (two) times daily. 16 g 5  . fluticasone furoate-vilanterol (BREO ELLIPTA) 200-25 MCG/INH AEPB Inhale 1 puff into the lungs daily.    . montelukast (SINGULAIR) 10 MG tablet Take 1 tablet (10 mg total) by mouth at bedtime. Needs visit for future refills. 30 tablet 0  . nitrofurantoin (MACRODANTIN) 100 MG capsule Take 100 mg by mouth at bedtime.    . pantoprazole (PROTONIX) 40 MG tablet Take 1 tablet (40 mg total) by mouth daily. 30 minutes before breakfast 90 tablet 3  . sodium chloride (OCEAN) 0.65 % SOLN nasal spray Place 1 spray into both nostrils as needed for congestion.    . polyethylene glycol (MIRALAX / GLYCOLAX) packet Take 17 g by mouth daily as needed for mild constipation.  No current facility-administered medications for this visit.     Allergies as of 05/06/2017 - Review Complete 05/06/2017  Allergen Reaction Noted  . Sulfa antibiotics Other (See Comments) 02/22/2011    Family History  Problem Relation Age of Onset  . Asthma Mother   . Macular degeneration Mother   . Hypertension Mother   . Alzheimer's disease Father   . Other Sister        blood clots  . Other Son        enlarged spleen  . Diabetes Maternal Grandfather   . Diabetes Paternal Grandfather   . Colon cancer Neg Hx     Social History   Social History  . Marital status: Widowed    Spouse name: N/A  . Number of children: 1  . Years of education: N/A   Occupational  History  . retired    Social History Main Topics  . Smoking status: Never Smoker  . Smokeless tobacco: Never Used  . Alcohol use No  . Drug use: No  . Sexual activity: Not Currently    Birth control/ protection: Surgical   Other Topics Concern  . None   Social History Narrative  . None    Review of Systems: Gen: Denies fever, chills, anorexia. Denies fatigue, weakness, weight loss.  CV: Denies chest pain, palpitations, syncope, peripheral edema, and claudication. Resp:see HPI  GI: see HPI  Derm: Denies rash, itching, dry skin Psych: Denies depression, anxiety, memory loss, confusion. No homicidal or suicidal ideation.  Heme: Denies bruising, bleeding, and enlarged lymph nodes.  Physical Exam: BP 116/66   Pulse (!) 59   Temp 97.7 F (36.5 C) (Oral)   Ht 5' 1.5" (1.562 m)   Wt 158 lb 3.2 oz (71.8 kg)   BMI 29.41 kg/m  General:   Alert and oriented. No distress noted. Pleasant and cooperative.  Head:  Normocephalic and atraumatic. Eyes:  Conjuctiva clear without scleral icterus. Mouth:  Oral mucosa pink and moist.  Abdomen:  +BS, soft, non-tender and non-distended. No rebound or guarding. No HSM or masses noted. Msk:  Symmetrical without gross deformities. Normal posture. Extremities:  Without edema. Neurologic:  Alert and  oriented x4 Psych:  Alert and cooperative. Normal mood and affect.

## 2017-05-06 NOTE — Assessment & Plan Note (Signed)
66 year old female with chronic GERD, with symptoms controlled on once daily dosing Protonix. Coughing is chronic and triggered by environmental stimuli. Has been evaluated by pulmonology, ENT, and GI thus far. EGD on file. She notes occasionally feeling as if she is "strangled" with liquids. Will pursue BPE to wrap up GI evaluation. GERD could play a role in her coughing, but I feel it is largely non-GI related. Will continue PPI therapy and pursue BPE.

## 2017-05-09 NOTE — Progress Notes (Signed)
CC'ED TO PCP 

## 2017-05-11 ENCOUNTER — Ambulatory Visit: Payer: Medicare HMO | Admitting: Urology

## 2017-05-12 ENCOUNTER — Ambulatory Visit (HOSPITAL_COMMUNITY)
Admission: RE | Admit: 2017-05-12 | Discharge: 2017-05-12 | Disposition: A | Discharge status: Home / Self Care | Payer: Medicare HMO | Source: Ambulatory Visit | Attending: Gastroenterology | Admitting: Gastroenterology

## 2017-05-12 DIAGNOSIS — R1312 Dysphagia, oropharyngeal phase: Secondary | ICD-10-CM | POA: Insufficient documentation

## 2017-05-12 DIAGNOSIS — K449 Diaphragmatic hernia without obstruction or gangrene: Secondary | ICD-10-CM | POA: Diagnosis not present

## 2017-05-12 DIAGNOSIS — R1310 Dysphagia, unspecified: Secondary | ICD-10-CM | POA: Diagnosis not present

## 2017-05-13 ENCOUNTER — Telehealth: Payer: Self-pay | Admitting: Internal Medicine

## 2017-05-13 NOTE — Telephone Encounter (Signed)
Pt called to say that she had a DG Esophagus done yesterday and was told that we would have the results today. I told her it takes 7-10 business days before results are available. Please call her at [phone] or [phone]

## 2017-05-16 NOTE — Telephone Encounter (Signed)
Routing to Anna

## 2017-05-17 NOTE — Telephone Encounter (Signed)
Please see results.

## 2017-05-17 NOTE — Telephone Encounter (Signed)
Pt was given results.  

## 2017-05-17 NOTE — Progress Notes (Signed)
BPE shows normal motility, smooth mucosa, no aspiration. Please let her know I feel we have evaluated thoroughly from a GI standpoint. Recommend pulmonary/ENT follow-up as she is doing.

## 2017-05-31 ENCOUNTER — Ambulatory Visit: Payer: Medicare HMO | Admitting: Urology

## 2017-05-31 DIAGNOSIS — N3020 Other chronic cystitis without hematuria: Secondary | ICD-10-CM

## 2017-05-31 DIAGNOSIS — R109 Unspecified abdominal pain: Secondary | ICD-10-CM | POA: Diagnosis not present

## 2017-06-06 DIAGNOSIS — R05 Cough: Secondary | ICD-10-CM | POA: Diagnosis not present

## 2017-06-06 DIAGNOSIS — F3340 Major depressive disorder, recurrent, in remission, unspecified: Secondary | ICD-10-CM | POA: Diagnosis not present

## 2017-07-01 ENCOUNTER — Ambulatory Visit: Payer: Medicare HMO | Admitting: Urology

## 2017-08-04 DIAGNOSIS — H1032 Unspecified acute conjunctivitis, left eye: Secondary | ICD-10-CM | POA: Diagnosis not present

## 2017-08-04 DIAGNOSIS — Z6831 Body mass index (BMI) 31.0-31.9, adult: Secondary | ICD-10-CM | POA: Diagnosis not present

## 2017-08-04 DIAGNOSIS — B372 Candidiasis of skin and nail: Secondary | ICD-10-CM | POA: Diagnosis not present

## 2017-08-17 ENCOUNTER — Other Ambulatory Visit (HOSPITAL_COMMUNITY): Payer: Self-pay | Admitting: Internal Medicine

## 2017-08-17 DIAGNOSIS — Z1231 Encounter for screening mammogram for malignant neoplasm of breast: Secondary | ICD-10-CM

## 2017-08-22 ENCOUNTER — Ambulatory Visit (HOSPITAL_COMMUNITY)
Admission: RE | Admit: 2017-08-22 | Discharge: 2017-08-22 | Disposition: A | Discharge status: Home / Self Care | Payer: Medicare HMO | Source: Ambulatory Visit | Attending: Internal Medicine | Admitting: Internal Medicine

## 2017-08-22 DIAGNOSIS — Z1231 Encounter for screening mammogram for malignant neoplasm of breast: Secondary | ICD-10-CM | POA: Diagnosis not present

## 2017-10-04 ENCOUNTER — Encounter: Payer: Self-pay | Admitting: Allergy and Immunology

## 2017-10-04 ENCOUNTER — Ambulatory Visit: Payer: Medicare HMO | Admitting: Allergy and Immunology

## 2017-10-04 VITALS — BP 112/72 | HR 82 | Temp 98.2°F | Resp 18 | Ht 60.63 in | Wt 158.8 lb

## 2017-10-04 DIAGNOSIS — R05 Cough: Secondary | ICD-10-CM

## 2017-10-04 DIAGNOSIS — J3089 Other allergic rhinitis: Secondary | ICD-10-CM | POA: Diagnosis not present

## 2017-10-04 DIAGNOSIS — J4540 Moderate persistent asthma, uncomplicated: Secondary | ICD-10-CM

## 2017-10-04 DIAGNOSIS — R053 Chronic cough: Secondary | ICD-10-CM

## 2017-10-04 MED ORDER — AZELASTINE HCL 0.1 % NA SOLN: NASAL | 5 refills | Status: DC

## 2017-10-04 MED ORDER — FLUTTER DEVI: 3 refills | Status: AC

## 2017-10-04 MED ORDER — BUDESONIDE-FORMOTEROL FUMARATE 160-4.5 MCG/ACT IN AERO: 2.0000 | INHALATION_SPRAY | Freq: Two times a day (BID) | RESPIRATORY_TRACT | 5 refills | Status: DC

## 2017-10-04 NOTE — Patient Instructions (Addendum)
Cough, persistent The most common causes of chronic cough include the following: upper airway cough syndrome (UACS) which is caused by variety of rhinosinus conditions; asthma; gastroesophageal reflux disease (GERD); chronic bronchitis from cigarette smoking or other inhaled environmental irritants; non-asthmatic eosinophilic bronchitis; and bronchiectasis. In prospective studies, these conditions have accounted for up to 94% of the causes of chronic cough in immunocompetent adults. The history and physical examination suggest that her cough is multifactorial with contribution from postnasal drainage, acid reflux, and possibly bronchial hyperresponsiveness. We will address these issues at this time.   A prescription has been provided for a flutter valve to be used as needed to break the coughing cycle.  Treatment plan as outlined below.     If the cough improves, we will decrease medications in a stepwise fashion.  If this problem persists or progresses despite the treatment plan as outlined below, we will refer to otolaryngology for further evaluation and treatment.  If that evaluation is unrevealing of etiology, neurogenic cough will be excluded with a therapeutic trial of Neurontin.  Perennial allergic rhinitis with a possible nonallergic component Aeroallergen avoidance measures have been discussed and provided in written form.  A prescription has been provided for azelastine nasal spray, one spray per nostril 1-2 times daily as needed. Proper nasal spray technique has been discussed and demonstrated.  For now, continue fluticasone nasal spray, 2 sprays per nostril once daily.  Nasal saline lavage (NeilMed) has been recommended as needed and prior to medicated nasal sprays along with instructions for proper administration.  For thick post nasal drainage, add guaifenesin [phone] mg (Mucinex)  twice daily as needed with adequate hydration as discussed.  GERD (gastroesophageal reflux  disease)  Appropriate reflux lifestyle modifications have been discussed and provided in written form.  For now, continue pantoprazole 40 mg daily, 30 minutes prior to breakfast.  Increase famotidine 20 mg from once daily to twice daily.  Moderate persistent asthma Spirometry reveals 11% postbronchodilator improvement.  The patient reports that she had a distinct reprieve from coughing after having received the Xopenex/Atrovent neb treatment. A prescription has been provided for Symbicort (budesonide/formoterol) 160/4.5 g,  2 inhalations twice a day. To maximize pulmonary deposition, a spacer has been provided along with instructions for its proper administration with an HFA inhaler.  A prescription has been provided for Spiriva Respimat 1.25 g, 2 inhalations daily.  For now, continue montelukast 10 mg daily bedtime and albuterol every 6 hours if needed.  Subjective and objective measures of pulmonary function will be followed and the treatment plan will be adjusted accordingly.   Return in about 2 months (around 12/04/2017), or if symptoms worsen or fail to improve.  Control of Mold Allergen  Mold and fungi can grow on a variety of surfaces provided certain temperature and moisture conditions exist.  Outdoor molds grow on plants, decaying vegetation and soil.  The major outdoor mold, Alternaria and Cladosporium, are found in very high numbers during hot and dry conditions.  Generally, a late Summer - Fall peak is seen for common outdoor fungal spores.  Rain will temporarily lower outdoor mold spore count, but counts rise rapidly when the rainy period ends.  The most important indoor molds are Aspergillus and Penicillium.  Dark, humid and poorly ventilated basements are ideal sites for mold growth.  The next most common sites of mold growth are the bathroom and the kitchen.  Outdoor [employer] 2. Use air conditioning and keep windows closed 3. Avoid exposure to decaying  vegetation.  4. Avoid leaf raking. 5. Avoid grain handling. 6. Consider wearing a face mask if working in moldy areas.  Indoor Mold Control 1. Maintain humidity below 50%. 2. Clean washable surfaces with 5% bleach solution. 3. Remove sources e.g. Contaminated carpets.  Control of Dog or Cat Allergen  Avoidance is the best way to manage a dog or cat allergy. If you have a dog or cat and are allergic to dog or cats, consider removing the dog or cat from the home. If you have a dog or cat but don't want to find it a new home, or if your family wants a pet even though someone in the household is allergic, here are some strategies that may help keep symptoms at bay:  1. Keep the pet out of your bedroom and restrict it to only a few rooms. Be advised that keeping the dog or cat in only one room will not limit the allergens to that room. 2. Don't pet, hug or kiss the dog or cat; if you do, wash your hands with soap and water. 3. High-efficiency particulate air (HEPA) cleaners run continuously in a bedroom or living room can reduce allergen levels over time. 4. Place electrostatic material sheet in the air inlet vent in the bedroom. 5. Regular use of a high-efficiency vacuum cleaner or a central vacuum can reduce allergen levels. 6. Giving your dog or cat a bath at least once a week can reduce airborne allergen.  Lifestyle Changes for Controlling GERD  When you have GERD, stomach acid feels as if it's backing up toward your mouth. Whether or not you take medication to control your GERD, your symptoms can often be improved with lifestyle changes.   Raise Your Head  Reflux is more likely to strike when you're lying down flat, because stomach fluid can  flow backward more easily. Raising the head of your bed 4-6 inches can help. To do this:  Slide blocks or books under the legs at the head of your bed. Or, place a wedge under  the mattress. Many foam stores can make a suitable wedge for you.  The wedge  should run from your waist to the top of your head.  Don't just prop your head on several pillows. This increases pressure on your  stomach. It can make GERD worse.  Watch Your Eating Habits Certain foods may increase the acid in your stomach or relax the lower esophageal sphincter, making GERD more likely. It's best to avoid the following:  Coffee, tea, and carbonated drinks (with and without caffeine)  Fatty, fried, or spicy food  Mint, chocolate, onions, and tomatoes  Any other foods that seem to irritate your stomach or cause you pain  Relieve the Pressure  Eat smaller meals, even if you have to eat more often.  Don't lie down right after you eat. Wait a few hours for your stomach to empty.  Avoid tight belts and tight-fitting clothes.  Lose excess weight.  Tobacco and Alcohol  Avoid smoking tobacco and drinking alcohol. They can make GERD symptoms worse.

## 2017-10-04 NOTE — Progress Notes (Signed)
New Patient Note  RE: [name] MRN: [id number] DOB: [birth year] Date of Office Visit: 10/04/2017  Referring provider: Asencion Noble, MD Primary care provider: Asencion Noble, MD  Chief Complaint: Cough and Nasal Congestion   History of present illness: [name] is a 67 y.o. female seen today in consultation requested by Asencion Noble, MD.  She reports that she has had a persistent cough over the past 19 years.  The cough is described as nonproductive, hacking.  There is no clear diurnal variation.  The cough is triggered by laughing or breathing hard. She notes that she has had a cough for 19 years and has lived in a mobile home with mold/water damage over the past 19 years as well.  She states that she was diagnosed with asthma many years ago.  She was prescribed Brio Ellipta approximately 3 years ago, however discontinued this medication because of her belief that the dry powder inhaler exacerbated, rather than relieved the cough.  She has been evaluated by pulmonologist, cardiologist, and gastroenterologist.  She experiences nasal congestion, rhinorrhea, thick postnasal drainage, and occasional sinus pressure. She is currently attempting to control the symptoms with fluticasone nasal spray and nasal saline spray. No significant seasonal symptom variation has been noted nor have specific environmental triggers been identified.    Assessment and plan: Cough, persistent The most common causes of chronic cough include the following: upper airway cough syndrome (UACS) which is caused by variety of rhinosinus conditions; asthma; gastroesophageal reflux disease (GERD); chronic bronchitis from cigarette smoking or other inhaled environmental irritants; non-asthmatic eosinophilic bronchitis; and bronchiectasis. In prospective studies, these conditions have accounted for up to 94% of the causes of chronic cough in immunocompetent adults. The history and physical examination suggest that her cough is  multifactorial with contribution from postnasal drainage, acid reflux, and possibly bronchial hyperresponsiveness. We will address these issues at this time.   A prescription has been provided for a flutter valve to be used as needed to break the coughing cycle.  Treatment plan as outlined below.     If the cough improves, we will decrease medications in a stepwise fashion.  If this problem persists or progresses despite the treatment plan as outlined below, we will refer to otolaryngology for further evaluation and treatment.  If that evaluation is unrevealing of etiology, neurogenic cough will be excluded with a therapeutic trial of Neurontin.  Perennial allergic rhinitis with a possible nonallergic component Aeroallergen avoidance measures have been discussed and provided in written form.  A prescription has been provided for azelastine nasal spray, one spray per nostril 1-2 times daily as needed. Proper nasal spray technique has been discussed and demonstrated.  For now, continue fluticasone nasal spray, 2 sprays per nostril once daily.  Nasal saline lavage (NeilMed) has been recommended as needed and prior to medicated nasal sprays along with instructions for proper administration.  For thick post nasal drainage, add guaifenesin [phone] mg (Mucinex)  twice daily as needed with adequate hydration as discussed.  GERD (gastroesophageal reflux disease)  Appropriate reflux lifestyle modifications have been discussed and provided in written form.  For now, continue pantoprazole 40 mg daily, 30 minutes prior to breakfast.  Increase famotidine 20 mg from once daily to twice daily.  Moderate persistent asthma Spirometry reveals 11% postbronchodilator improvement.  The patient reports that she had a distinct reprieve from coughing after having received the Xopenex/Atrovent neb treatment. A prescription has been provided for Symbicort (budesonide/formoterol) 160/4.5 g,  2 inhalations twice  a day. To maximize pulmonary deposition, a spacer has been provided along with instructions for its proper administration with an HFA inhaler.  A prescription has been provided for Spiriva Respimat 1.25 g, 2 inhalations daily.  For now, continue montelukast 10 mg daily bedtime and albuterol every 6 hours if needed.  Subjective and objective measures of pulmonary function will be followed and the treatment plan will be adjusted accordingly.   Meds ordered this encounter  Medications  . azelastine (ASTELIN) 0.1 % nasal spray    Sig: One spray per nostril 1-2 times a day    Dispense:  30 mL    Refill:  5  . budesonide-formoterol (SYMBICORT) 160-4.5 MCG/ACT inhaler    Sig: Inhale 2 puffs into the lungs 2 (two) times daily. Rinse, gargle and spit out after use    Dispense:  1 Inhaler    Refill:  5  . Respiratory Therapy Supplies (FLUTTER) DEVI    Sig: Use as directed    Dispense:  1 each    Refill:  3    Diagnostics: Spirometry: Spirometry reveals an FVC of 1.92 L and an FEV1 of 1.43 L (68% predicted) with 11% postbronchodilator improvement.  Please see scanned spirometry results for details. Epicutaneous testing: Negative despite a positive histamine control. Intradermal testing: Positive to molds and dog epithelium.    Physical examination: Blood pressure 112/72, pulse 82, temperature 98.2 F (36.8 C), temperature source Oral, resp. rate 18, height 5' 0.63" (1.54 m), weight 158 lb 12.8 oz (72 kg), SpO2 98 %.  General: Alert, interactive, in no acute distress. HEENT: TMs pearly gray, turbinates moderately edematous with thick discharge, post-pharynx moderately erythematous. Neck: Supple without lymphadenopathy. Lungs: Mildly decreased breath sounds bilaterally without wheezing, rhonchi or rales. CV: Normal S1, S2 without murmurs. Abdomen: Nondistended, nontender. Skin: Warm and dry, without lesions or rashes. Extremities:  No clubbing, cyanosis or edema. Neuro:   Grossly  intact.  Review of systems:  Review of systems negative except as noted in HPI / PMHx or noted below: Review of Systems  Constitutional: Negative.   HENT: Negative.   Eyes: Negative.   Respiratory: Negative.   Cardiovascular: Negative.   Gastrointestinal: Negative.   Genitourinary: Negative.   Musculoskeletal: Negative.   Skin: Negative.   Neurological: Negative.   Endo/Heme/Allergies: Negative.   Psychiatric/Behavioral: Negative.     Past medical history:  Past Medical History:  Diagnosis Date  . Anxiety   . Asthma    had 1 episode 1 year ago-no more problem  . CHF (congestive heart failure) (HCC)    swelling of feet & ankles  . Cough   . Depression    OCD  . GERD (gastroesophageal reflux disease)    severe  . OCD (obsessive compulsive disorder)   . Shortness of breath    with exertion  . Yeast infection 04/09/2014    Past surgical history:  Past Surgical History:  Procedure Laterality Date  . CARPAL TUNNEL RELEASE  03/22/2012   Procedure: CARPAL TUNNEL RELEASE;  Surgeon: Wynonia Sours, MD;  Location: [facility];  Service: Orthopedics;  Laterality: Right;  right carpal tunnel release  . COLONOSCOPY  06/19/2012   Dr. Rourk:normal rectum and colon   . ESOPHAGOGASTRODUODENOSCOPY N/A 12/26/2015   Dr. Gala Romney: normal   . TONSILLECTOMY    . TUBAL LIGATION      Family history: Family History  Problem Relation Age of Onset  . Asthma Mother   . Macular degeneration Mother   .  Hypertension Mother   . Alzheimer's disease Father   . Other Sister        blood clots  . Other Son        enlarged spleen  . Diabetes Maternal Grandfather   . Diabetes Paternal Grandfather   . Colon cancer Neg Hx     Social history: Social History   Socioeconomic History  . Marital status: Widowed    Spouse name: Not on file  . Number of children: 1  . Years of education: Not on file  . Highest education level: Not on file  Social Needs  . Financial resource strain: Not  on file  . Food insecurity - worry: Not on file  . Food insecurity - inability: Not on file  . Transportation needs - medical: Not on file  . Transportation needs - non-medical: Not on file  Occupational History  . Occupation: retired  Tobacco Use  . Smoking status: Passive Smoke Exposure - Never Smoker  . Smokeless tobacco: Never Used  Substance and Sexual Activity  . Alcohol use: No  . Drug use: No  . Sexual activity: Not Currently    Birth control/protection: Surgical  Other Topics Concern  . Not on file  Social History Narrative  . Not on file   Environmental History: The patient lives in a double wide trailer with carpeting throughout and central air/heat.  There is mold/water damage there is a cat in the home which has access to her bedroom.  She is a non-smoker.  Allergies as of 10/04/2017      Reactions   Sulfa Antibiotics Other (See Comments)   Unknown- pt unsure of reaction; believes it may be nausea      Medication List        Accurate as of 10/04/17  5:55 PM. Always use your most recent med list.          acetaminophen 500 MG tablet Commonly known as:  TYLENOL Take 1,000 mg by mouth every 6 (six) hours as needed.   albuterol 108 (90 Base) MCG/ACT inhaler Commonly known as:  PROVENTIL HFA;VENTOLIN HFA Inhale 2 puffs into the lungs every 6 (six) hours as needed for wheezing or shortness of breath.   azelastine 0.1 % nasal spray Commonly known as:  ASTELIN One spray per nostril 1-2 times a day   BREO ELLIPTA 200-25 MCG/INH Aepb Generic drug:  fluticasone furoate-vilanterol Inhale 1 puff into the lungs daily.   budesonide-formoterol 160-4.5 MCG/ACT inhaler Commonly known as:  SYMBICORT Inhale 2 puffs into the lungs 2 (two) times daily. Rinse, gargle and spit out after use   cetirizine 10 MG tablet Commonly known as:  ZYRTEC Take 10 mg by mouth daily.   famotidine 20 MG tablet Commonly known as:  PEPCID TAKE ONE TABLET BY MOUTH AT BEDTIME.     FLUoxetine 20 MG capsule Commonly known as:  PROZAC Take 1 capsule by mouth daily.   fluticasone 50 MCG/ACT nasal spray Commonly known as:  FLONASE Place 2 sprays into both nostrils 2 (two) times daily.   FLUTTER Devi Use as directed   montelukast 10 MG tablet Commonly known as:  SINGULAIR Take 1 tablet (10 mg total) by mouth at bedtime. Needs visit for future refills.   nitrofurantoin 100 MG capsule Commonly known as:  MACRODANTIN Take 100 mg by mouth at bedtime.   pantoprazole 40 MG tablet Commonly known as:  PROTONIX Take 1 tablet (40 mg total) by mouth daily. 30 minutes before breakfast  polyethylene glycol packet Commonly known as:  MIRALAX / GLYCOLAX Take 17 g by mouth daily as needed for mild constipation.   sodium chloride 0.65 % Soln nasal spray Commonly known as:  OCEAN Place 1 spray into both nostrils as needed for congestion.       Known medication allergies: Allergies  Allergen Reactions  . Sulfa Antibiotics Other (See Comments)    Unknown- pt unsure of reaction; believes it may be nausea    I appreciate the opportunity to take part in Kinga's care. Please do not hesitate to contact me with questions.  Sincerely,   R. Edgar Frisk, MD

## 2017-10-04 NOTE — Assessment & Plan Note (Addendum)
Spirometry reveals 11% postbronchodilator improvement.  The patient reports that she had a distinct reprieve from coughing after having received the Xopenex/Atrovent neb treatment. A prescription has been provided for Symbicort (budesonide/formoterol) 160/4.5 g, 2 inhalations twice a day. To maximize pulmonary deposition, a spacer has been provided along with instructions for its proper administration with an HFA inhaler.  A prescription has been provided for Spiriva Respimat 1.25 g, 2 inhalations daily.  For now, continue montelukast 10 mg daily bedtime and albuterol every 6 hours if needed.  Subjective and objective measures of pulmonary function will be followed and the treatment plan will be adjusted accordingly.

## 2017-10-04 NOTE — Assessment & Plan Note (Addendum)
Aeroallergen avoidance measures have been discussed and provided in written form.  A prescription has been provided for azelastine nasal spray, one spray per nostril 1-2 times daily as needed. Proper nasal spray technique has been discussed and demonstrated.  For now, continue fluticasone nasal spray, 2 sprays per nostril once daily.  Nasal saline lavage (NeilMed) has been recommended as needed and prior to medicated nasal sprays along with instructions for proper administration.  For thick post nasal drainage, add guaifenesin [phone] mg (Mucinex)  twice daily as needed with adequate hydration as discussed.

## 2017-10-04 NOTE — Assessment & Plan Note (Addendum)
The most common causes of chronic cough include the following: upper airway cough syndrome (UACS) which is caused by variety of rhinosinus conditions; asthma; gastroesophageal reflux disease (GERD); chronic bronchitis from cigarette smoking or other inhaled environmental irritants; non-asthmatic eosinophilic bronchitis; and bronchiectasis. In prospective studies, these conditions have accounted for up to 94% of the causes of chronic cough in immunocompetent adults. The history and physical examination suggest that her cough is multifactorial with contribution from postnasal drainage, acid reflux, and possibly bronchial hyperresponsiveness. We will address these issues at this time.   A prescription has been provided for a flutter valve to be used as needed to break the coughing cycle.  Treatment plan as outlined below.     If the cough improves, we will decrease medications in a stepwise fashion.  If this problem persists or progresses despite the treatment plan as outlined below, we will refer to otolaryngology for further evaluation and treatment.  If that evaluation is unrevealing of etiology, neurogenic cough will be excluded with a therapeutic trial of Neurontin.

## 2017-10-04 NOTE — Assessment & Plan Note (Signed)
   Appropriate reflux lifestyle modifications have been discussed and provided in written form.  For now, continue pantoprazole 40 mg daily, 30 minutes prior to breakfast.  Increase famotidine 20 mg from once daily to twice daily.

## 2017-10-05 ENCOUNTER — Other Ambulatory Visit: Payer: Self-pay

## 2017-10-05 MED ORDER — FAMOTIDINE 20 MG PO TABS: 20.0000 mg | ORAL_TABLET | Freq: Two times a day (BID) | ORAL | 3 refills | Status: DC

## 2017-10-05 MED ORDER — PANTOPRAZOLE SODIUM 40 MG PO TBEC: 40.0000 mg | DELAYED_RELEASE_TABLET | Freq: Every day | ORAL | 3 refills | Status: DC

## 2017-10-05 NOTE — Telephone Encounter (Signed)
Fax request from pharmacy for pantoprazole 40 mg qd. Per yesterdays OV note patient is to take this medication once daily. The famotidine has been increased to twice daily. I will send a corrected prescription to pharmacy showing the increase in amount.

## 2017-10-06 ENCOUNTER — Other Ambulatory Visit: Payer: Self-pay

## 2017-10-06 ENCOUNTER — Telehealth: Payer: Self-pay | Admitting: Allergy and Immunology

## 2017-10-06 MED ORDER — PANTOPRAZOLE SODIUM 40 MG PO TBEC: 40.0000 mg | DELAYED_RELEASE_TABLET | Freq: Every day | ORAL | 3 refills | Status: DC

## 2017-10-06 NOTE — Telephone Encounter (Signed)
Pt advised to simply obtain a [place] Flutter valve. Any brand is sufficient. RF on Pantoprazole not needed.

## 2017-10-06 NOTE — Telephone Encounter (Signed)
Patient is calling for 2 things  1))  Patient was told to look for a flutter valve on [employer] and order it offline - patient states there are so many to choose from, wants to make sure she selects the right one  2)) patient states that she is supposed to take her PANTOPRAZOLE twice daily now - needs new script called into [facility]  Please call patient with any questions

## 2017-11-02 DIAGNOSIS — Z6830 Body mass index (BMI) 30.0-30.9, adult: Secondary | ICD-10-CM | POA: Diagnosis not present

## 2017-11-02 DIAGNOSIS — F325 Major depressive disorder, single episode, in full remission: Secondary | ICD-10-CM | POA: Diagnosis not present

## 2017-11-02 DIAGNOSIS — J4540 Moderate persistent asthma, uncomplicated: Secondary | ICD-10-CM | POA: Diagnosis not present

## 2017-11-02 DIAGNOSIS — F3340 Major depressive disorder, recurrent, in remission, unspecified: Secondary | ICD-10-CM | POA: Diagnosis not present

## 2017-11-29 ENCOUNTER — Ambulatory Visit: Payer: Medicare HMO | Admitting: Allergy and Immunology

## 2017-11-29 ENCOUNTER — Encounter: Payer: Self-pay | Admitting: Allergy and Immunology

## 2017-11-29 ENCOUNTER — Ambulatory Visit (HOSPITAL_COMMUNITY)
Admission: RE | Admit: 2017-11-29 | Discharge: 2017-11-29 | Disposition: A | Discharge status: Home / Self Care | Payer: Medicare HMO | Source: Ambulatory Visit | Attending: Allergy and Immunology | Admitting: Allergy and Immunology

## 2017-11-29 VITALS — BP 108/60 | HR 64 | Temp 98.1°F | Resp 16 | Ht 60.0 in | Wt 164.0 lb

## 2017-11-29 DIAGNOSIS — I77810 Thoracic aortic ectasia: Secondary | ICD-10-CM | POA: Diagnosis not present

## 2017-11-29 DIAGNOSIS — K219 Gastro-esophageal reflux disease without esophagitis: Secondary | ICD-10-CM

## 2017-11-29 DIAGNOSIS — R05 Cough: Secondary | ICD-10-CM | POA: Insufficient documentation

## 2017-11-29 DIAGNOSIS — R053 Chronic cough: Secondary | ICD-10-CM

## 2017-11-29 DIAGNOSIS — J4540 Moderate persistent asthma, uncomplicated: Secondary | ICD-10-CM

## 2017-11-29 DIAGNOSIS — J3089 Other allergic rhinitis: Secondary | ICD-10-CM | POA: Diagnosis not present

## 2017-11-29 DIAGNOSIS — R0602 Shortness of breath: Secondary | ICD-10-CM | POA: Diagnosis not present

## 2017-11-29 MED ORDER — CARBINOXAMINE MALEATE 4 MG PO TABS: 4.0000 mg | ORAL_TABLET | Freq: Three times a day (TID) | ORAL | 5 refills | Status: DC | PRN

## 2017-11-29 MED ORDER — ALBUTEROL SULFATE HFA 108 (90 BASE) MCG/ACT IN AERS: 2.0000 | INHALATION_SPRAY | Freq: Four times a day (QID) | RESPIRATORY_TRACT | 2 refills | Status: DC | PRN

## 2017-11-29 NOTE — Assessment & Plan Note (Addendum)
Proceed with a therapeutic trial of Neurontin.No perceived improvement despite compliance with multiple medications.  A chest x-ray, PA and lateral, has been ordered.  Her last chest x-ray was approximately 1 year ago.  A prescription has been provided for carbinoxamine 4 mg every 8 hours if needed.  Treatment plan as outlined below.  If chest x-ray is unrevealing and the cough persists despite compliance with the treatment plan, will proceed with a therapeutic trial of Neurontin.

## 2017-11-29 NOTE — Assessment & Plan Note (Signed)
   Continue appropriate allergen avoidance measures, azelastine nasal spray, fluticasone nasal spray, and nasal saline irrigation.  Carbinoxamine has been prescribed (as above).

## 2017-11-29 NOTE — Assessment & Plan Note (Signed)
   Continue appropriate reflux lifestyle modifications, pantoprazole 40 mg daily, and famotidine 20 mg twice daily.

## 2017-11-29 NOTE — Patient Instructions (Addendum)
Cough, persistent Proceed with a therapeutic trial of Neurontin.No perceived improvement despite compliance with multiple medications.  A chest x-ray, PA and lateral, has been ordered.  Her last chest x-ray was approximately 1 year ago.  A prescription has been provided for carbinoxamine 4 mg every 8 hours if needed.  Treatment plan as outlined below.  If chest x-ray is unrevealing and the cough persists despite compliance with the treatment plan, will proceed with a therapeutic trial of Neurontin.  Moderate persistent asthma  For now, continue Symbicort 160-4.5 g, 2 inhalations via spacer device twice daily, Spiriva Respimat 2 inhalations daily, montelukast 10 mg daily bedtime, and albuterol HFA, 1 to 2 inhalations every 6 hours if needed.  Subjective and objective measures of pulmonary function will be followed and the treatment plan will be adjusted accordingly.  Perennial allergic rhinitis with a possible nonallergic component  Continue appropriate allergen avoidance measures, azelastine nasal spray, fluticasone nasal spray, and nasal saline irrigation.  Carbinoxamine has been prescribed (as above).  GERD (gastroesophageal reflux disease)  Continue appropriate reflux lifestyle modifications, pantoprazole 40 mg daily, and famotidine 20 mg twice daily.   Patient will be called with chest x-ray results and further instructions if necessary. Follow-up in 4 months or sooner if needed.

## 2017-11-29 NOTE — Assessment & Plan Note (Signed)
   For now, continue Symbicort 160-4.5 g, 2 inhalations via spacer device twice daily, Spiriva Respimat 2 inhalations daily, montelukast 10 mg daily bedtime, and albuterol HFA, 1 to 2 inhalations every 6 hours if needed.  Subjective and objective measures of pulmonary function will be followed and the treatment plan will be adjusted accordingly.

## 2017-11-29 NOTE — Progress Notes (Signed)
Follow-up Note  RE: [name] MRN: [id number] DOB: [birth year] Date of Office Visit: 11/29/2017  Primary care provider: Asencion Noble, MD Referring provider: Asencion Noble, MD  History of present illness: [name] is a 67 y.o. female with persistent asthma, mixed rhinitis, reflux, and history of persistent cough presenting today for sick visit.  She was previously seen in this clinic for her initial evaluation on October 04, 2017.  She reports that despite compliance with multiple prescription medications she has not perceived improvement in the cough.  She admits that she forgot to start taking guaifenesin.  She reports that she has been "constantly coughing".  There is some wheezing associated with the cough on rare occasions.  She has been taking Symbicort 160-4.5 g, 2 inhalations via spacer device twice daily, Spiriva Respimat 1.25 g, 2 inhalations daily, montelukast 10 mg daily at bedtime, pantoprazole 40 mg daily, famotidine 20 mg twice daily, azelastine nasal spray, fluticasone nasal spray, and nasal saline irrigation.  Assessment and plan: Cough, persistent Proceed with a therapeutic trial of Neurontin.No perceived improvement despite compliance with multiple medications.  A chest x-ray, PA and lateral, has been ordered.  Her last chest x-ray was approximately 1 year ago.  A prescription has been provided for carbinoxamine 4 mg every 8 hours if needed.  Treatment plan as outlined below.  If chest x-ray is unrevealing and the cough persists despite compliance with the treatment plan, will proceed with a therapeutic trial of Neurontin.  Moderate persistent asthma  For now, continue Symbicort 160-4.5 g, 2 inhalations via spacer device twice daily, Spiriva Respimat 2 inhalations daily, montelukast 10 mg daily bedtime, and albuterol HFA, 1 to 2 inhalations every 6 hours if needed.  Subjective and objective measures of pulmonary function will be followed and the treatment plan will be  adjusted accordingly.  Perennial allergic rhinitis with a possible nonallergic component  Continue appropriate allergen avoidance measures, azelastine nasal spray, fluticasone nasal spray, and nasal saline irrigation.  Carbinoxamine has been prescribed (as above).  GERD (gastroesophageal reflux disease)  Continue appropriate reflux lifestyle modifications, pantoprazole 40 mg daily, and famotidine 20 mg twice daily.   Meds ordered this encounter  Medications  . albuterol (PROVENTIL HFA;VENTOLIN HFA) 108 (90 Base) MCG/ACT inhaler    Sig: Inhale 2 puffs into the lungs every 6 (six) hours as needed for wheezing or shortness of breath.    Dispense:  1 Inhaler    Refill:  2  . Carbinoxamine Maleate 4 MG TABS    Sig: Take 1 tablet (4 mg total) by mouth every 8 (eight) hours as needed.    Dispense:  30 each    Refill:  5    Diagnostics: Spirometry reveals an FVC of 2.11 L (80% predicted) and an FEV1 of 1.40 L (70% predicted) with 6% postbronchodilator improvement.  The patient noted improvement in her cough after the nebulized levalbuterol/ipratropium.  Please see scanned spirometry results for details.    Physical examination: Blood pressure 108/60, pulse 64, temperature 98.1 F (36.7 C), temperature source Oral, resp. rate 16, height 5' (1.524 m), weight 164 lb (74.4 kg), SpO2 97 %.  General: Alert, interactive, in no acute distress. HEENT: TMs pearly gray, turbinates moderately edematous without discharge, post-pharynx moderately erythematous. Neck: Supple without lymphadenopathy. Lungs: Clear to auscultation without wheezing, rhonchi or rales. CV: Normal S1, S2 without murmurs. Skin: Warm and dry, without lesions or rashes.  The following portions of the patient's history were reviewed and updated as appropriate: allergies,  current medications, past family history, past medical history, past social history, past surgical history and problem list.  Allergies as of 11/29/2017       Reactions   Sulfa Antibiotics Other (See Comments)   Unknown- pt unsure of reaction; believes it may be nausea      Medication List        Accurate as of 11/29/17 12:58 PM. Always use your most recent med list.          acetaminophen 500 MG tablet Commonly known as:  TYLENOL Take 1,000 mg by mouth every 6 (six) hours as needed.   albuterol 108 (90 Base) MCG/ACT inhaler Commonly known as:  PROVENTIL HFA;VENTOLIN HFA Inhale 2 puffs into the lungs every 6 (six) hours as needed for wheezing or shortness of breath.   azelastine 0.1 % nasal spray Commonly known as:  ASTELIN One spray per nostril 1-2 times a day   BREO ELLIPTA 200-25 MCG/INH Aepb Generic drug:  fluticasone furoate-vilanterol Inhale 1 puff into the lungs daily.   budesonide-formoterol 160-4.5 MCG/ACT inhaler Commonly known as:  SYMBICORT Inhale 2 puffs into the lungs 2 (two) times daily. Rinse, gargle and spit out after use   Carbinoxamine Maleate 4 MG Tabs Take 1 tablet (4 mg total) by mouth every 8 (eight) hours as needed.   cetirizine 10 MG tablet Commonly known as:  ZYRTEC Take 10 mg by mouth daily.   famotidine 20 MG tablet Commonly known as:  PEPCID Take 1 tablet (20 mg total) by mouth 2 (two) times daily.   FLUoxetine 20 MG capsule Commonly known as:  PROZAC Take 1 capsule by mouth daily.   fluticasone 50 MCG/ACT nasal spray Commonly known as:  FLONASE Place 2 sprays into both nostrils 2 (two) times daily.   FLUTTER Devi Use as directed   montelukast 10 MG tablet Commonly known as:  SINGULAIR Take 1 tablet (10 mg total) by mouth at bedtime. Needs visit for future refills.   nitrofurantoin 100 MG capsule Commonly known as:  MACRODANTIN Take 100 mg by mouth at bedtime.   pantoprazole 40 MG tablet Commonly known as:  PROTONIX Take 1 tablet (40 mg total) by mouth daily. 30 minutes before breakfast   polyethylene glycol packet Commonly known as:  MIRALAX / GLYCOLAX Take 17 g by mouth  daily as needed for mild constipation.   sodium chloride 0.65 % Soln nasal spray Commonly known as:  OCEAN Place 1 spray into both nostrils as needed for congestion.       Allergies  Allergen Reactions  . Sulfa Antibiotics Other (See Comments)    Unknown- pt unsure of reaction; believes it may be nausea   Review of systems: Review of systems negative except as noted in HPI / PMHx or noted below: Constitutional: Negative.  HENT: Negative.   Eyes: Negative.  Respiratory: Negative.   Cardiovascular: Negative.  Gastrointestinal: Negative.  Genitourinary: Negative.  Musculoskeletal: Negative.  Neurological: Negative.  Endo/Heme/Allergies: Negative.  Cutaneous: Negative.  Past Medical History:  Diagnosis Date  . Anxiety   . Asthma    had 1 episode 1 year ago-no more problem  . CHF (congestive heart failure) (HCC)    swelling of feet & ankles  . Cough   . Depression    OCD  . GERD (gastroesophageal reflux disease)    severe  . OCD (obsessive compulsive disorder)   . Shortness of breath    with exertion  . Yeast infection 04/09/2014    Family History  Problem  Relation Age of Onset  . Asthma Mother   . Macular degeneration Mother   . Hypertension Mother   . Alzheimer's disease Father   . Other Sister        blood clots  . Other Son        enlarged spleen  . Diabetes Maternal Grandfather   . Diabetes Paternal Grandfather   . Colon cancer Neg Hx     Social History   Socioeconomic History  . Marital status: Widowed    Spouse name: Not on file  . Number of children: 1  . Years of education: Not on file  . Highest education level: Not on file  Occupational History  . Occupation: retired  Scientific laboratory technician  . Financial resource strain: Not on file  . Food insecurity:    Worry: Not on file    Inability: Not on file  . Transportation needs:    Medical: Not on file    Non-medical: Not on file  Tobacco Use  . Smoking status: Passive Smoke Exposure - Never Smoker   . Smokeless tobacco: Never Used  Substance and Sexual Activity  . Alcohol use: No  . Drug use: No  . Sexual activity: Not Currently    Birth control/protection: Surgical  Lifestyle  . Physical activity:    Days per week: Not on file    Minutes per session: Not on file  . Stress: Not on file  Relationships  . Social connections:    Talks on phone: Not on file    Gets together: Not on file    Attends religious service: Not on file    Active member of club or organization: Not on file    Attends meetings of clubs or organizations: Not on file    Relationship status: Not on file  . Intimate partner violence:    Fear of current or ex partner: Not on file    Emotionally abused: Not on file    Physically abused: Not on file    Forced sexual activity: Not on file  Other Topics Concern  . Not on file  Social History Narrative  . Not on file    I appreciate the opportunity to take part in [place] care. Please do not hesitate to contact me with questions.  Sincerely,   R. Edgar Frisk, MD

## 2017-12-02 ENCOUNTER — Telehealth: Payer: Self-pay | Admitting: Allergy & Immunology

## 2017-12-02 NOTE — Telephone Encounter (Signed)
Patient called about her chest x-ray from [facility]. Please call back.

## 2017-12-02 NOTE — Telephone Encounter (Signed)
LM FOR PT TO CALL US BACK

## 2017-12-02 NOTE — Telephone Encounter (Signed)
INFORMED PT OF CHEST X RAY RESULTS

## 2017-12-28 ENCOUNTER — Other Ambulatory Visit: Payer: Self-pay | Admitting: Allergy and Immunology

## 2018-01-10 ENCOUNTER — Telehealth: Payer: Self-pay | Admitting: Allergy and Immunology

## 2018-01-10 NOTE — Telephone Encounter (Signed)
Pt  Called and wants to talk with a nurse or doctor about her meds. To see if that is what is causing her voice to come and go.Dent. [phone].

## 2018-01-10 NOTE — Telephone Encounter (Signed)
Dr Bobbitt please advise 

## 2018-01-11 NOTE — Telephone Encounter (Signed)
I doubt it is from any of the meds, unless she is not using a spacer device and rinsing after inhaler use. Have her worked in to see someone in the next couple days. Thanks.

## 2018-01-11 NOTE — Telephone Encounter (Signed)
Pt. Is doing everything we have asked her to do. She started experiencing the hoarseness 2 weeks ago. Dr. Verlin Fester wanted her to be seen by one of the physicians in [place] in the next day or so. Pt. Will be seeing Dr. Kathee Polite  Tomorrow at 10:45 am. Pt. States she's been to that office before.

## 2018-01-11 NOTE — Telephone Encounter (Signed)
Patient states she is already doing this as described the only thing she is not doing is the nasal saline prior to azelastine but she will start this. Wants to know if the weather is affecting her or is the hoarseness side effect of any of her medications please advise

## 2018-01-11 NOTE — Telephone Encounter (Signed)
   Add guaifenesin [phone] mg (Mucinex)  twice daily as needed with adequate hydration.  Use nasal saline lavage followed by azelastine nasal spray twice daily for now.  Please check to make sure that she is using a spacer device and rinsing after using her inhalers.

## 2018-01-11 NOTE — Telephone Encounter (Signed)
Pt has called 2 times about want to do about her voice been so bad.

## 2018-01-11 NOTE — Telephone Encounter (Signed)
Spoke to patient states she has been hoarse x 2 weeks she is wondering if is could be her allergies, post nasal drip or medications stats she has no pain and has not tried anything at home. Dr Verlin Fester please advise

## 2018-01-12 ENCOUNTER — Ambulatory Visit: Payer: Medicare HMO | Admitting: Allergy & Immunology

## 2018-01-12 ENCOUNTER — Encounter: Payer: Self-pay | Admitting: Allergy & Immunology

## 2018-01-12 VITALS — BP 124/84 | HR 92 | Resp 16

## 2018-01-12 DIAGNOSIS — K219 Gastro-esophageal reflux disease without esophagitis: Secondary | ICD-10-CM | POA: Diagnosis not present

## 2018-01-12 DIAGNOSIS — R490 Dysphonia: Secondary | ICD-10-CM | POA: Diagnosis not present

## 2018-01-12 DIAGNOSIS — J3089 Other allergic rhinitis: Secondary | ICD-10-CM

## 2018-01-12 DIAGNOSIS — J383 Other diseases of vocal cords: Secondary | ICD-10-CM

## 2018-01-12 DIAGNOSIS — R05 Cough: Secondary | ICD-10-CM

## 2018-01-12 DIAGNOSIS — J4540 Moderate persistent asthma, uncomplicated: Secondary | ICD-10-CM | POA: Diagnosis not present

## 2018-01-12 DIAGNOSIS — R053 Chronic cough: Secondary | ICD-10-CM

## 2018-01-12 NOTE — Patient Instructions (Addendum)
1. Cough with current horaseness -  We are still unsure of the cough, but we are glad that the current medications seem to be working well. - We will refer you to Speech Therapy for evaluation of possible vocal cord dysfunction.  - I do not think that steroids are needed at this time, but call us back if there is no improvement by Monday and we can send in a short burst to see if this would help. - In the meantime, you should get a call from the Speech Therapist next week to schedule an appointment.   2. Moderate persistent asthma, uncomplicated - Lung testing looks normal today. - I believe that the Spiriva listed in the last note was a mistake. - In any case, I do not think that steroids are needed today. - Daily controller medication(s): Symbicort 160/4.77mcg two puffs twice daily with spacer - Prior to physical activity: ProAir 2 puffs 10-15 minutes before physical activity. - Rescue medications: ProAir 4 puffs every 4-6 hours as needed - Asthma control goals:  * Full participation in all desired activities (may need albuterol before activity) * Albuterol use two time or less a week on average (not counting use with activity) * Cough interfering with sleep two time or less a month * Oral steroids no more than once a year * No hospitalizations  3. Perennial allergic rhinitis with a possible nonallergic component - Continue with fluticasone 1-2 sprays per nostril daily. - Continue with azelastine 1-2 sprays per nostril dialy. - Continue with carbinoxamine 4mg  daily.   4. Gastroesophageal reflux disease - Continue with Protonix 40mg  daily. - Continue with famotidine 20mg  twice daily.  5. Return in about 3 months (around 04/14/2018).   Please inform us of any Emergency Department visits, hospitalizations, or changes in symptoms. Call us before going to the ED for breathing or allergy symptoms since we might be able to fit you in for a sick visit. Feel free to contact us anytime with any  questions, problems, or concerns.  It was a pleasure to meet you today!  Websites that have reliable patient information: 1. American Academy of Asthma, Allergy, and Immunology: www.aaaai.org 2. Food Allergy Research and Education (FARE): foodallergy.org 3. Mothers of Asthmatics: http://www.asthmacommunitynetwork.org 4. American College of Allergy, Asthma, and Immunology: MonthlyElectricBill.co.uk   Make sure you are registered to vote!

## 2018-01-12 NOTE — Progress Notes (Signed)
FOLLOW UP  Date of Service/Encounter:  01/12/18   Assessment:   Cough, persistent  Moderate persistent asthma, uncomplicated  Perennial allergic rhinitis with a possible nonallergic component  Gastroesophageal reflux disease - on PPI + H2 blockers   Ms. Bujak is a delightful 67 year old female presenting with hoarseness.  She has a long-standing history of cough, which is finally been addressed with a combination of asthma medications, reflux medications, and rhinitis medications.  However, the hoarseness has lasted for approximately 2 weeks.  She has had similar symptoms in the past, most notably when she is exposed to colognes and other sense.  She does have a particular problem and floor shops.  She has never seen an ENT in the past and has never seen a speech therapist.  She is interested in a speech therapy evaluation after a thorough discussion about the benefits of breathing exercises.  Her symptoms are certainly consistent with vocal cord dysfunction plus or minus reactive airway dysfunction syndrome.  She is doing fairly well on her current medications, and I anticipate weaning off of some of these medications once we have speech therapy involved.  She is very interested in getting off of some of her medications, as she has never been one that takes medications on a routine basis. We did discuss using steroids, but I do not think they are warranted at this time.  She does not want the side effects of steroids, but she will let us know early next week if she continues to have problems with hoarseness.  We could certainly send in a 5-day prednisone course for her if needed.    Plan/Recommendations:   1. Cough with current horaseness -  We are still unsure of the cough, but we are glad that the current medications seem to be working well. - We will refer you to Speech Therapy for evaluation of possible vocal cord dysfunction.  - I do not think that steroids are needed at this time,  but call us back if there is no improvement by Monday and we can send in a short burst to see if this would help. - In the meantime, you should get a call from the Speech Therapist next week to schedule an appointment.   2. Moderate persistent asthma, uncomplicated - Lung testing looks normal today. - I believe that the Spiriva listed in the last note was a mistake. - In any case, I do not think that steroids are needed today. - Daily controller medication(s): Symbicort 160/4.78mcg two puffs twice daily with spacer - Prior to physical activity: ProAir 2 puffs 10-15 minutes before physical activity. - Rescue medications: ProAir 4 puffs every 4-6 hours as needed - Asthma control goals:  * Full participation in all desired activities (may need albuterol before activity) * Albuterol use two time or less a week on average (not counting use with activity) * Cough interfering with sleep two time or less a month * Oral steroids no more than once a year * No hospitalizations  3. Perennial allergic rhinitis with a possible nonallergic component - Continue with fluticasone 1-2 sprays per nostril daily. - Continue with azelastine 1-2 sprays per nostril dialy. - Continue with carbinoxamine 4mg  daily.   4. Gastroesophageal reflux disease - Continue with Protonix 40mg  daily. - Continue with famotidine 20mg  twice daily.  5. Return in about 3 months (around 04/14/2018).    Subjective:   Taffie LASHAWNDRA LAMPKINS is a 67 y.o. female presenting today for follow up of  Chief  Complaint  Patient presents with  . Hoarse  . Cough    Alysen J Spurr has a history of the following: Patient Active Problem List   Diagnosis Date Noted  . Perennial allergic rhinitis with a possible nonallergic component 10/04/2017  . Moderate persistent asthma 10/04/2017  . Oropharyngeal dysphagia 05/06/2017  . Esophageal reflux   . Constipation 11/26/2015  . Yeast infection 04/09/2014  . Obesity (BMI 30-39.9) 01/31/2014  . Cough,  persistent   . CHF (congestive heart failure) ([place])   . Depression   . Anxiety   . Encounter for screening colonoscopy 05/28/2012  . GERD (gastroesophageal reflux disease) 05/28/2012  . Low back pain 06/17/2011    History obtained from: chart review and patient.  [facility] Primary Care Provider is Asencion Noble, MD.     Odesser is a 67 y.o. female presenting for a follow up visit.  She was last seen in May 2019 by Dr. Verlin Fester.  At that time, she continued to have a cough.  She was started on gabapentin to see if this would help.  There was no improvement with the cough despite multiple medications, including asthma medications.  She did have a chest x-ray that was normal.  She was also started on carbinoxamine 4 mg every 8 hours as needed.  For her asthma, she was continued on Symbicort 160/4.5 mcg 2 puffs twice daily.  She has a history of perennial allergic rhinitis with a possible nonallergic component.  She was continued on as a lasting as well as fluticasone and nasal irrigation.  She was also continued on Protonix 40 mg daily and famotidine 20 mg twice daily for her reflux.  Since the last visit, she has mostly done well. She does think that her coughing has improved with the multitude of medications.  She does have all of her medications here today, but with regards to inhaler she only has a Symbicort and a rescue medication.  She is not familiar with Spiriva at all and when I show her picture of it she seems thoroughly confused.  In addition, although the last note discusses starting gabapentin, she is not on this at this time and has no recollection of this.  Neither Spiriva nor Neurontin are listed in her medications.  She has had hoarseness for the past two weeks. She did have this years who before she started seeing [place]. She has remained on all of her medications. However she is not on her Spiriva and she does not recognize the device at all.   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 She tells me that she had coughing when she talks to certain visitors in the [facility]. She does report some problems with certain scents including colognes and perfumes.  She has never seen a speech therapist. She has been told in the past that she might have a problem with her vocal cords, but she has never seen ENT for this. She was told that a speech therapist will help with teaching her how to "talk" and she tells me that she "does not need help talking".   Otherwise, there have been no changes to her past medical history, surgical history, family history, or social history.    Review of Systems: a 14-point review of systems is pertinent for what is mentioned in HPI.  Otherwise, all other systems were negative. Constitutional: negative other  than that listed in the HPI Eyes: negative other than that listed in the HPI Ears, nose, mouth, throat, and face: negative other than that listed in the HPI Respiratory: negative other than that listed in the HPI Cardiovascular: negative other than that listed in the HPI Gastrointestinal: negative other than that listed in the HPI Genitourinary: negative other than that listed in the HPI Integument: negative other than that listed in the HPI Hematologic: negative other than that listed in the HPI Musculoskeletal: negative other than that listed in the HPI Neurological: negative other than that listed in the HPI Allergy/Immunologic: negative other than that listed in the HPI    Objective:   Blood pressure 124/84, pulse 92, resp. rate 16. There is no height or weight on file to calculate BMI.   Physical Exam:  General: Alert, interactive, in no acute distress. Very adorable female. Talkative.  Eyes: No conjunctival injection bilaterally, no discharge on the right, no discharge on the left and no Horner-Trantas dots present. PERRL bilaterally.  EOMI without pain. No photophobia.  Ears: Right TM pearly gray with normal light reflex, Left TM pearly gray with normal light reflex, Right TM intact without perforation and Left TM intact without perforation.  Nose/Throat: External nose within normal limits and septum midline. Turbinates edematous with clear discharge. Posterior oropharynx mildly erythematous without cobblestoning in the posterior oropharynx. Tonsils 2+ without exudates.  Tongue without thrush. Lungs: Clear to auscultation without wheezing, rhonchi or rales. No increased work of breathing. CV: Normal S1/S2. No murmurs. Capillary refill <2 seconds.  Skin: Warm and dry, without lesions or rashes. Neuro:   Grossly intact. No focal deficits appreciated. Responsive to questions.  Diagnostic studies:   Spirometry: results normal (FEV1: 1.47/73%, FVC: 2.06/78%, FEV1/FVC: 71%).    Spirometry consistent with normal pattern.   Allergy Studies: none       Salvatore Marvel, MD  [facility] [place] [facility] [place]

## 2018-01-13 NOTE — Addendum Note (Signed)
Addended by: Isabel Caprice on: 01/13/2018 09:02 AM   Modules accepted: Orders

## 2018-01-17 ENCOUNTER — Telehealth: Payer: Self-pay

## 2018-01-17 NOTE — Telephone Encounter (Signed)
Noted!  Thank you for letting me know.  Joretta Eads, MD [facility]  

## 2018-01-17 NOTE — Telephone Encounter (Signed)
-----   Message from Valentina Shaggy, MD sent at 01/12/2018 11:15 AM EDT ----- Speech referral placed. Patient prefers [facility] if possible.

## 2018-01-17 NOTE — Telephone Encounter (Signed)
Referral placed in [facility] for the [facility] Speech department

## 2018-01-23 ENCOUNTER — Encounter: Payer: Self-pay | Admitting: Obstetrics and Gynecology

## 2018-01-23 ENCOUNTER — Ambulatory Visit: Payer: Medicare HMO | Admitting: Obstetrics and Gynecology

## 2018-01-23 ENCOUNTER — Other Ambulatory Visit (HOSPITAL_COMMUNITY)
Admission: RE | Admit: 2018-01-23 | Discharge: 2018-01-23 | Disposition: A | Discharge status: Home / Self Care | Payer: Medicare HMO | Source: Ambulatory Visit | Attending: Obstetrics and Gynecology | Admitting: Obstetrics and Gynecology

## 2018-01-23 VITALS — BP 118/72 | HR 80 | Ht 62.25 in | Wt 162.4 lb

## 2018-01-23 DIAGNOSIS — Z124 Encounter for screening for malignant neoplasm of cervix: Secondary | ICD-10-CM | POA: Diagnosis not present

## 2018-01-23 DIAGNOSIS — Z1151 Encounter for screening for human papillomavirus (HPV): Secondary | ICD-10-CM | POA: Insufficient documentation

## 2018-01-23 DIAGNOSIS — Z01419 Encounter for gynecological examination (general) (routine) without abnormal findings: Secondary | ICD-10-CM

## 2018-01-23 NOTE — Progress Notes (Signed)
Patient ID: [name], female   DOB: [birth year], 67 y.o.   MRN: [id number]   Assessment:  Annual Gyn Exam Plan:  1. pap smear done, next pap due 3 years 2. return annually or prn 3    Annual mammogram advised after age 72 Subjective:  [name] is a 67 y.o. female G1P1 who presents for annual exam. No LMP recorded. Patient is postmenopausal. The patient has no complaints today. Last pap was 03/24/2011.  The following portions of the patient's history were reviewed and updated as appropriate: allergies, current medications, past family history, past medical history, past social history, past surgical history and problem list. Past Medical History:  Diagnosis Date  . Anxiety   . Asthma    had 1 episode 1 year ago-no more problem  . CHF (congestive heart failure) (HCC)    swelling of feet & ankles  . Cough   . Depression    OCD  . GERD (gastroesophageal reflux disease)    severe  . OCD (obsessive compulsive disorder)   . Shortness of breath    with exertion  . Yeast infection 04/09/2014    Past Surgical History:  Procedure Laterality Date  . CARPAL TUNNEL RELEASE  03/22/2012   Procedure: CARPAL TUNNEL RELEASE;  Surgeon: Wynonia Sours, MD;  Location: [place];  Service: Orthopedics;  Laterality: Right;  right carpal tunnel release  . COLONOSCOPY  06/19/2012   Dr. Rourk:normal rectum and colon   . ESOPHAGOGASTRODUODENOSCOPY N/A 12/26/2015   Dr. Gala Romney: normal   . TONSILLECTOMY    . TUBAL LIGATION       Current Outpatient Medications:  .  acetaminophen (TYLENOL) 500 MG tablet, Take 1,000 mg by mouth every 6 (six) hours as needed., Disp: , Rfl:  .  albuterol (PROVENTIL HFA;VENTOLIN HFA) 108 (90 Base) MCG/ACT inhaler, Inhale 2 puffs into the lungs every 6 (six) hours as needed for wheezing or shortness of breath., Disp: 1 Inhaler, Rfl: 2 .  azelastine (ASTELIN) 0.1 % nasal spray, One spray per nostril 1-2 times a day, Disp: 30 mL, Rfl: 5 .  budesonide-formoterol  (SYMBICORT) 160-4.5 MCG/ACT inhaler, Inhale 2 puffs into the lungs 2 (two) times daily. Rinse, gargle and spit out after use, Disp: 1 Inhaler, Rfl: 5 .  Carbinoxamine Maleate 4 MG TABS, Take 1 tablet (4 mg total) by mouth every 8 (eight) hours as needed., Disp: 30 each, Rfl: 5 .  cetirizine (ZYRTEC) 10 MG tablet, Take 10 mg by mouth daily., Disp: , Rfl:  .  famotidine (PEPCID) 20 MG tablet, TAKE 1 TABLET BY MOUTH TWICE DAILY, Disp: 60 tablet, Rfl: 4 .  FLUoxetine (PROZAC) 20 MG capsule, Take 1 capsule by mouth daily., Disp: , Rfl:  .  fluticasone (FLONASE) 50 MCG/ACT nasal spray, Place 2 sprays into both nostrils 2 (two) times daily., Disp: 16 g, Rfl: 5 .  fluticasone furoate-vilanterol (BREO ELLIPTA) 200-25 MCG/INH AEPB, Inhale 1 puff into the lungs daily., Disp: , Rfl:  .  montelukast (SINGULAIR) 10 MG tablet, Take 1 tablet (10 mg total) by mouth at bedtime. Needs visit for future refills., Disp: 30 tablet, Rfl: 0 .  nitrofurantoin (MACRODANTIN) 100 MG capsule, Take 100 mg by mouth at bedtime., Disp: , Rfl:  .  pantoprazole (PROTONIX) 40 MG tablet, Take 1 tablet (40 mg total) by mouth daily. 30 minutes before breakfast, Disp: 90 tablet, Rfl: 3 .  pantoprazole (PROTONIX) 40 MG tablet, TAKE 1 TABLET BY MOUTH ONCE DAILY 30 MINUTES BEFORE  BREAKFAST., Disp: 30 tablet, Rfl: 4 .  polyethylene glycol (MIRALAX / GLYCOLAX) packet, Take 17 g by mouth daily as needed for mild constipation. , Disp: , Rfl:  .  Respiratory Therapy Supplies (FLUTTER) DEVI, Use as directed, Disp: 1 each, Rfl: 3 .  sodium chloride (OCEAN) 0.65 % SOLN nasal spray, Place 1 spray into both nostrils as needed for congestion., Disp: , Rfl:   Review of Systems Constitutional: negative Gastrointestinal: negative Genitourinary: normal  Objective:  There were no vitals taken for this visit.   BMI: There is no height or weight on file to calculate BMI.  General Appearance: Alert, appropriate appearance for age. No acute  distress HEENT: Grossly normal Neck / Thyroid:  Cardiovascular: RRR; normal S1, S2, no murmur Lungs: CTA bilaterally Back: No CVAT Breast Exam: No dimpling, nipple retraction or discharge. No masses or nodes., Normal to inspection and No masses or nodes.No dimpling, nipple retraction or discharge. Gastrointestinal: Soft, non-tender, no masses or organomegaly Pelvic Exam:  ADNEXA: normal adnexa in size, nontender and no masses. RECTAL: guaiac negative stool obtained,  VAGINA: normal appearing vagina with normal color and discharge, no lesions, atrophic,small specumlum used CERVIX:  Atropic, post menopausal, good support UTERUS: tiny PAP: Pap smear done today. Lymphatic Exam: Non-palpable nodes in neck, clavicular, axillary, or inguinal regions Skin: no rash or abnormalities Neurologic: Normal gait and speech, no tremor  Psychiatric: Alert and oriented, appropriate affect.  Urinalysis:Not done  By signing my name below, I, Samul Dada, attest that this documentation has been prepared under the direction and in the presence of Jonnie Kind, MD. Electronically Signed: [place]. 01/23/18. 11:37 AM.  I personally performed the services described in this documentation, which was SCRIBED in my presence. The recorded information has been reviewed and considered accurate. It has been edited as necessary during review. Jonnie Kind, MD

## 2018-01-25 LAB — CYTOLOGY - PAP
Diagnosis: NEGATIVE
HPV (WINDOPATH): NOT DETECTED

## 2018-01-25 NOTE — Telephone Encounter (Signed)
Scheduled for July 11th

## 2018-01-26 ENCOUNTER — Other Ambulatory Visit: Payer: Self-pay

## 2018-01-26 ENCOUNTER — Ambulatory Visit (HOSPITAL_COMMUNITY): Payer: Medicare HMO | Attending: Allergy & Immunology | Admitting: Speech Pathology

## 2018-01-26 ENCOUNTER — Encounter (HOSPITAL_COMMUNITY): Payer: Self-pay | Admitting: Speech Pathology

## 2018-01-26 DIAGNOSIS — R490 Dysphonia: Secondary | ICD-10-CM | POA: Diagnosis not present

## 2018-01-26 NOTE — Telephone Encounter (Signed)
LMOVM that pap was normal and will need repeat pap in 5 years which will be final pap.

## 2018-01-30 ENCOUNTER — Telehealth: Payer: Self-pay

## 2018-01-30 NOTE — Therapy (Signed)
[place], [place], [address] Phone: [phone]   Fax:  [phone]  Speech Language Pathology Evaluation  Patient Details  Name: [name] MRN: [id number] Date of Birth: Jan 24, 1951 Referring Provider: Salvatore Marvel, MD   Encounter Date: 01/26/2018  End of Session - 01/26/18 1743    Visit Number  1    Number of Visits  1    Authorization Type  [employer] Medicare HMO $40 copay, OOP [id number]    SLP Start Time  1330    SLP Stop Time   1415    SLP Time Calculation (min)  45 min    Activity Tolerance  Patient tolerated treatment well       Past Medical History:  Diagnosis Date  . Anxiety   . Asthma    had 1 episode 1 year ago-no more problem  . CHF (congestive heart failure) (HCC)    swelling of feet & ankles  . Cough   . Depression    OCD  . GERD (gastroesophageal reflux disease)    severe  . OCD (obsessive compulsive disorder)   . Shortness of breath    with exertion  . Yeast infection 04/09/2014    Past Surgical History:  Procedure Laterality Date  . CARPAL TUNNEL RELEASE  03/22/2012   Procedure: CARPAL TUNNEL RELEASE;  Surgeon: Wynonia Sours, MD;  Location: [place];  Service: Orthopedics;  Laterality: Right;  right carpal tunnel release  . COLONOSCOPY  06/19/2012   Dr. Rourk:normal rectum and colon   . ESOPHAGOGASTRODUODENOSCOPY N/A 12/26/2015   Dr. Gala Romney: normal   . TONSILLECTOMY    . TUBAL LIGATION      There were no vitals filed for this visit.      SLP Evaluation [facility] - 01/26/18 0001      SLP Visit Information   SLP Received On  01/26/18    Referring Provider  Salvatore Marvel, MD    Onset Date  01/12/2018 01/12/2018    Medical Diagnosis  hoarseness      Subjective   Subjective  "I have been hoarse for three weeks."    Patient/Family Stated Goal  Improve vocal quality      Pain Assessment   Currently in Pain?  No/denies      General Information   HPI  [name] is a  67 yo female who was referred for a voice evaluation by Dr. Salvatore Marvel due to hoarseness for three weeks. She has a long standing history of cough. The Pt reportedly saw ENT, Dr. Benjamine Mola over a year ago for a separate issue, but has not seen one since onset of hoarseness. Her allergist, Dr. Ernst Bowler felt that he symptoms were consistent with vocal cord dysfunction plus or minus reactive airway dysfunction syndrome. Pt with past medical history significant for asthma, allergic rhinitis, and GERD for which all are being treated with medications. 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    Behavioral/Cognition  Alert and engaged    Mobility Status  ambulatory      Balance Screen   Has the patient fallen in the past 6 months  No    Has the patient had a decrease in activity level because of a fear of falling?   No    Is the patient reluctant to leave their home because of a fear of falling?   No      Prior Functional  Status   Cognitive/Linguistic Baseline  Within functional limits    Vocation  Part time employment      Cognition   Overall Cognitive Status  Within Functional Limits for tasks assessed      Auditory Comprehension   Overall Auditory Comprehension  Appears within functional limits for tasks assessed      Visual Recognition/Discrimination   Discrimination  Within Function Limits      Reading Comprehension   Reading Status  Not tested      Expression   Primary Mode of Expression  Verbal      Verbal Expression   Overall Verbal Expression  Appears within functional limits for tasks assessed      Oral Motor/Sensory Function   Overall Oral Motor/Sensory Function  Appears within functional limits for tasks assessed      Motor Speech   Overall Motor Speech  Impaired    Respiration  Impaired    Level of Impairment  Sentence    Phonation  Hoarse harsh    Resonance  Within functional limits    Articulation  Within functional limitis     Intelligibility  Intelligible    Motor Planning  Witnin functional limits    Motor Speech Errors  Not applicable    Phonation  Impaired    Vocal Abuses  Habitual Hyperphonia;Habitual Cough/Throat Clear;Glottal Attack;Vocal Fold Dehydration    Volume  Loud    Pitch  -- slightly elevated        SLP Education - 01/26/18 1741    Education Details  Pt was given Vocal hygiene information; she will need an evaluation by ENT to visualize vocal folds due to hoarseness over three weeks. Pt demonstrates habitual hyperphonia, habitual cough, hard glottal attack, glottal fry, and likely vocal fold dehydration in setting of asthma and reflux. She endorses drinking 3-4 cups of coffee per day and was noted to cough significantly throughout our visit (worse after sustained /a/ trials). Pt was marginally successful with vocal unloading tasks when given max cues from SLP.   Pt may benefit from voice therapy, however it would be prudent to have her evaluated by ENT/SLP who can perform videostroboscopy to visual vocal folds. Pt may very well have vocal cord dysfunction (VCD), however I am unable to diagnose it, but can treat if confirmed by ENT. Pt was given vocal hygiene techniques and introduced to breathing exercises, however she will need to be evaluated by ENT. Pt could be referred to Dr. Blenda Nicely in [place] or [facility] (Dr. Ernestine Conrad and Bubba Camp). I would be happy to see Mrs. Mcshan back here if she so desires and it is recommended after ENT appointment.    Person(s) Educated  Patient    Methods  Explanation;Handout    Comprehension  Verbalized understanding        Plan - 01/26/18 1745    Clinical Impression Statement  Pt presents with hoarse, harsh vocal quality    Consulted and Agree with Plan of Care  Patient       Patient will benefit from skilled therapeutic intervention in order to improve the following deficits and impairments:   Hoarseness    Problem List Patient Active  Problem List   Diagnosis Date Noted  . Perennial allergic rhinitis with a possible nonallergic component 10/04/2017  . Moderate persistent asthma 10/04/2017  . Oropharyngeal dysphagia 05/06/2017  . Esophageal reflux   . Constipation 11/26/2015  . Yeast infection 04/09/2014  . Obesity (BMI 30-39.9) 01/31/2014  . Cough, persistent   .  CHF (congestive heart failure) ([place])   . Depression   . Anxiety   . Encounter for screening colonoscopy 05/28/2012  . GERD (gastroesophageal reflux disease) 05/28/2012  . Low back pain 06/17/2011   Thank you,  Genene Churn, [place]  [facility] 01/26/2018, 5:49 PM  [place] [address] [place], [place], [address] Phone: [phone]   Fax:  [phone]  Name: Kamoni J Sansoucie MRN: [id number] Date of Birth: [birth year]

## 2018-01-30 NOTE — Telephone Encounter (Signed)
Patient went to see speech therapist that Dr Ernst Bowler reccommended  for cough and vocal cord dysfunction. Patient states the therapist recommends her going to a ENT/Speech at [facility]. Patient doesn't understand why she was even sent to the speech therapist and why she has to see another ENT when she seen one years ago. She would like to here from Dr Verlin Fester since that was her original doctor that she seen.   Please Advise

## 2018-01-30 NOTE — Telephone Encounter (Signed)
This would be better addressed by Dr. Ernst Bowler.

## 2018-02-01 NOTE — Telephone Encounter (Signed)
LVM asking her to give me a call back to address concerns.   Salvatore Marvel, MD [facility] [place] [facility] [place]

## 2018-02-06 ENCOUNTER — Telehealth: Payer: Self-pay | Admitting: Allergy & Immunology

## 2018-02-06 NOTE — Telephone Encounter (Signed)
Please advise 

## 2018-02-06 NOTE — Telephone Encounter (Signed)
Pt called back to see what dr gallagher wanted to do about her hoarseness.

## 2018-02-07 NOTE — Telephone Encounter (Signed)
Call This patient

## 2018-02-07 NOTE — Telephone Encounter (Signed)
I will call her again today.  Salvatore Marvel, MD [facility] [place] [facility] [place]

## 2018-02-07 NOTE — Telephone Encounter (Signed)
Call this pt

## 2018-02-08 NOTE — Telephone Encounter (Signed)
I was finally able to talk to [name] to discuss her symptoms. Evidently her hoarseness is improving with the use of the exercises taught by the Speech Therapist. She remains on her asthma medications as well as her GERD medications. She prefers to hold off on an ENT referral since she is improving. She is "tired of doctors".   We will revisit this at her follow up appointment in September and refer to ENT at that time, if needed.   Salvatore Marvel, MD [facility] and [place] [facility] [place]

## 2018-02-08 NOTE — Telephone Encounter (Signed)
I called [name] back to discuss additional workup for her hoarseness. LVM asking her to give [place] a call back.  Salvatore Marvel, MD [facility] and [place] [facility] [place]

## 2018-03-09 DIAGNOSIS — Z79899 Other long term (current) drug therapy: Secondary | ICD-10-CM | POA: Diagnosis not present

## 2018-03-09 DIAGNOSIS — I509 Heart failure, unspecified: Secondary | ICD-10-CM | POA: Diagnosis not present

## 2018-03-09 DIAGNOSIS — F329 Major depressive disorder, single episode, unspecified: Secondary | ICD-10-CM | POA: Diagnosis not present

## 2018-03-09 DIAGNOSIS — K219 Gastro-esophageal reflux disease without esophagitis: Secondary | ICD-10-CM | POA: Diagnosis not present

## 2018-03-09 DIAGNOSIS — J45902 Unspecified asthma with status asthmaticus: Secondary | ICD-10-CM | POA: Diagnosis not present

## 2018-03-14 ENCOUNTER — Encounter: Payer: Self-pay | Admitting: Allergy and Immunology

## 2018-03-14 ENCOUNTER — Ambulatory Visit: Payer: Medicare HMO | Admitting: Allergy and Immunology

## 2018-03-14 ENCOUNTER — Encounter (INDEPENDENT_AMBULATORY_CARE_PROVIDER_SITE_OTHER): Payer: Self-pay

## 2018-03-14 VITALS — BP 92/62 | HR 70 | Temp 98.1°F | Resp 20

## 2018-03-14 DIAGNOSIS — J3089 Other allergic rhinitis: Secondary | ICD-10-CM | POA: Diagnosis not present

## 2018-03-14 DIAGNOSIS — R05 Cough: Secondary | ICD-10-CM | POA: Diagnosis not present

## 2018-03-14 DIAGNOSIS — K219 Gastro-esophageal reflux disease without esophagitis: Secondary | ICD-10-CM | POA: Diagnosis not present

## 2018-03-14 DIAGNOSIS — R490 Dysphonia: Secondary | ICD-10-CM

## 2018-03-14 DIAGNOSIS — J4540 Moderate persistent asthma, uncomplicated: Secondary | ICD-10-CM | POA: Diagnosis not present

## 2018-03-14 DIAGNOSIS — R053 Chronic cough: Secondary | ICD-10-CM

## 2018-03-14 MED ORDER — IPRATROPIUM BROMIDE 0.06 % NA SOLN: 2.0000 | Freq: Three times a day (TID) | NASAL | 5 refills | Status: DC | PRN

## 2018-03-14 NOTE — Assessment & Plan Note (Addendum)
   For now, continue Symbicort 160-4.5 g, 2 inhalations twice daily, montelukast 10 mg daily bedtime, and albuterol HFA, 1 to 2 inhalations every 6 hours if needed.  The importance of compliance with the spacer device has been emphasized.  Subjective and objective measures of pulmonary function will be followed and the treatment plan will be adjusted accordingly.

## 2018-03-14 NOTE — Progress Notes (Signed)
Follow-up Note  RE: [name] MRN: [id number] DOB: February 10, 1951 Date of Office Visit: 03/14/2018  Primary care provider: Asencion Noble, MD Referring provider: Asencion Noble, MD  History of present illness: [name] is a 67 y.o. female with persistent asthma and a persistent cough and hoarseness presenting today for follow-up.  She was last seen in this clinic on [name] 27, 2019 by Dr. Ernst Bowler for coughing and hoarseness.  Her treatment plan was not changed at that time but she was referred to speech therapy for evaluation.  She reports that the speech therapist seemed confused as to why she had been referred and recommended that [name] see an Youth worker.  She presents today complaining of continued coughing and hoarseness. She is currently taking azelastine nasal spray daily, guaifenesin, Symbicort 160-4.5 g, 2 inhalations twice daily, montelukast, and pantoprazole.  She admits that she has not been using the spacer device consistently with her HFA inhalers.  She discontinued the budesonide/saline rinses because she did not perceive benefit.  She has not been using the flutter valve because she has not perceived benefit.  She reports that when she bends forward to pick something up off the floor she starts coughing and "mucus gets going and will not stop" and she feels she needs to "keep spitting and spitting and spitting."  While taking the Symbicort and the montelukast she rarely experiences chest tightness and/or wheezing.  Assessment and plan: Cough, persistent This problem has persisted despite multiple medications.  A prescription has been provided for ipratropium 0.06% nasal spray, 2 sprays per nostril 2 or 3 times daily as needed.  Continue azelastine nasal spray, 1 to 2 sprays per nostril twice daily.  Continue guaifenesin.  Nasal saline lavage (NeilMed) has been recommended as needed and prior to medicated nasal sprays along with instructions for proper  administration.  Continue appropriate reflux lifestyle modifications and pantoprazole as prescribed.     She will follow up with her otolaryngologist, Dr. Benjamine [name].  Hoarseness Treatment plan as outlined above.  Moderate persistent asthma  For now, continue Symbicort 160-4.5 g, 2 inhalations twice daily, montelukast 10 mg daily bedtime, and albuterol HFA, 1 to 2 inhalations every 6 hours if needed.  The importance of compliance with the spacer device has been emphasized.  Subjective and objective measures of pulmonary function will be followed and the treatment plan will be adjusted accordingly.   Meds ordered this encounter  Medications  . ipratropium (ATROVENT) 0.06 % nasal spray    Sig: Place 2 sprays into both nostrils 3 (three) times daily as needed for rhinitis.    Dispense:  15 mL    Refill:  5    Diagnostics: Prematurity reveals an FVC of 2.30 L (87% predicted) and an FEV1 of 1.49 L (74% predicted) without postbronchodilator improvement.  Please see scanned spirometry results for details.    Physical examination: Blood pressure 92/62, pulse 70, temperature 98.1 F (36.7 C), temperature source Oral, resp. rate 20, SpO2 92 %.  General: Alert, interactive, in no acute distress. HEENT: TMs pearly gray, turbinates moderately edematous without discharge, post-pharynx erythematous. Neck: Supple without lymphadenopathy. Lungs: Clear to auscultation without wheezing, rhonchi or rales. CV: Normal S1, S2 without murmurs. Skin: Warm and dry, without lesions or rashes.  The following portions of the patient's history were reviewed and updated as appropriate: allergies, current medications, past family history, past medical history, past social history, past surgical history and problem list.  Allergies as of 03/14/2018  Reactions   Sulfa Antibiotics Other (See Comments)   Unknown- pt unsure of reaction; believes it may be nausea      Medication List        Accurate as  of 03/14/18  7:29 PM. Always use your most recent med list.          acetaminophen 500 MG tablet Commonly known as:  TYLENOL Take 1,000 mg by mouth every 6 (six) hours as needed.   albuterol 108 (90 Base) MCG/ACT inhaler Commonly known as:  PROVENTIL HFA;VENTOLIN HFA Inhale 2 puffs into the lungs every 6 (six) hours as needed for wheezing or shortness of breath.   azelastine 0.1 % nasal spray Commonly known as:  ASTELIN One spray per nostril 1-2 times a day   budesonide-formoterol 160-4.5 MCG/ACT inhaler Commonly known as:  SYMBICORT Inhale 2 puffs into the lungs 2 (two) times daily. Rinse, gargle and spit out after use   Carbinoxamine Maleate 4 MG Tabs Take 1 tablet (4 mg total) by mouth every 8 (eight) hours as needed.   cetirizine 10 MG tablet Commonly known as:  ZYRTEC Take 10 mg by mouth daily.   famotidine 20 MG tablet Commonly known as:  PEPCID TAKE 1 TABLET BY MOUTH TWICE DAILY   FLUoxetine 20 MG capsule Commonly known as:  PROZAC Take 1 capsule by mouth daily.   fluticasone 50 MCG/ACT nasal spray Commonly known as:  FLONASE Place 2 sprays into both nostrils 2 (two) times daily.   FLUTTER Devi Use as directed   ipratropium 0.06 % nasal spray Commonly known as:  ATROVENT Place 2 sprays into both nostrils 3 (three) times daily as needed for rhinitis.   montelukast 10 MG tablet Commonly known as:  SINGULAIR Take 1 tablet (10 mg total) by mouth at bedtime. Needs visit for future refills.   nitrofurantoin 100 MG capsule Commonly known as:  MACRODANTIN Take 100 mg by mouth at bedtime.   pantoprazole 40 MG tablet Commonly known as:  PROTONIX TAKE 1 TABLET BY MOUTH ONCE DAILY 30 MINUTES BEFORE BREAKFAST.   sodium chloride 0.65 % Soln nasal spray Commonly known as:  OCEAN Place 1 spray into both nostrils as needed for congestion.       Allergies  Allergen Reactions  . Sulfa Antibiotics Other (See Comments)    Unknown- pt unsure of reaction; believes  it may be nausea   Review of systems: Review of systems negative except as noted in HPI / PMHx or noted below: Constitutional: Negative.  HENT: Negative.   Eyes: Negative.  Respiratory: Negative.   Cardiovascular: Negative.  Gastrointestinal: Negative.  Genitourinary: Negative.  Musculoskeletal: Negative.  Neurological: Negative.  Endo/Heme/Allergies: Negative.  Cutaneous: Negative.  Past Medical History:  Diagnosis Date  . Anxiety   . Asthma    had 1 episode 1 year ago-no more problem  . CHF (congestive heart failure) (HCC)    swelling of feet & ankles  . Cough   . Depression    OCD  . GERD (gastroesophageal reflux disease)    severe  . OCD (obsessive compulsive disorder)   . Shortness of breath    with exertion  . Yeast infection 04/09/2014    Family History  Problem Relation Age of Onset  . Asthma Mother   . Macular degeneration Mother   . Hypertension Mother   . Alzheimer's disease Father   . Other Sister        blood clots  . Other Son  enlarged spleen  . Diabetes Maternal Grandfather   . Diabetes Paternal Grandfather   . Colon cancer Neg Hx     Social History   Socioeconomic History  . Marital status: Widowed    Spouse name: Not on file  . Number of children: 1  . Years of education: Not on file  . Highest education level: Not on file  Occupational History  . Occupation: retired  Scientific laboratory technician  . Financial resource strain: Not on file  . Food insecurity:    Worry: Not on file    Inability: Not on file  . Transportation needs:    Medical: Not on file    Non-medical: Not on file  Tobacco Use  . Smoking status: Passive Smoke Exposure - Never Smoker  . Smokeless tobacco: Never Used  Substance and Sexual Activity  . Alcohol use: No  . Drug use: No  . Sexual activity: Yes    Birth control/protection: Surgical    Comment: tubal  Lifestyle  . Physical activity:    Days per week: Not on file    Minutes per session: Not on file  .  Stress: Not on file  Relationships  . Social connections:    Talks on phone: Not on file    Gets together: Not on file    Attends religious service: Not on file    Active member of club or organization: Not on file    Attends meetings of clubs or organizations: Not on file    Relationship status: Not on file  . Intimate partner violence:    Fear of current or ex partner: Not on file    Emotionally abused: Not on file    Physically abused: Not on file    Forced sexual activity: Not on file  Other Topics Concern  . Not on file  Social History Narrative  . Not on file    I appreciate the opportunity to take part in [place] care. Please do not hesitate to contact me with questions.  Sincerely,   R. Edgar Frisk, MD

## 2018-03-14 NOTE — Patient Instructions (Addendum)
Cough, persistent This problem has persisted despite multiple medications.  A prescription has been provided for ipratropium 0.06% nasal spray, 2 sprays per nostril 2 or 3 times daily as needed.  Continue azelastine nasal spray, 1 to 2 sprays per nostril twice daily.  Continue guaifenesin.  Nasal saline lavage (NeilMed) has been recommended as needed and prior to medicated nasal sprays along with instructions for proper administration.  Continue appropriate reflux lifestyle modifications and pantoprazole as prescribed.     She will follow up with her otolaryngologist, Dr. Benjamine Mola.  Hoarseness Treatment plan as outlined above.  Moderate persistent asthma  For now, continue Symbicort 160-4.5 g, 2 inhalations twice daily, montelukast 10 mg daily bedtime, and albuterol HFA, 1 to 2 inhalations every 6 hours if needed.  The importance of compliance with the spacer device has been emphasized.  Subjective and objective measures of pulmonary function will be followed and the treatment plan will be adjusted accordingly.  Follow-up with Dr. Benjamine Mola. Return in about 5 months (around 08/14/2018), or if symptoms worsen or fail to improve.

## 2018-03-14 NOTE — Assessment & Plan Note (Addendum)
This problem has persisted despite multiple medications.  A prescription has been provided for ipratropium 0.06% nasal spray, 2 sprays per nostril 2 or 3 times daily as needed.  Continue azelastine nasal spray, 1 to 2 sprays per nostril twice daily.  Continue guaifenesin.  Nasal saline lavage (NeilMed) has been recommended as needed and prior to medicated nasal sprays along with instructions for proper administration.  Continue appropriate reflux lifestyle modifications and pantoprazole as prescribed.     She will follow up with her otolaryngologist, Dr. Benjamine Mola.

## 2018-03-16 DIAGNOSIS — D72819 Decreased white blood cell count, unspecified: Secondary | ICD-10-CM | POA: Diagnosis not present

## 2018-03-16 DIAGNOSIS — J4540 Moderate persistent asthma, uncomplicated: Secondary | ICD-10-CM | POA: Diagnosis not present

## 2018-03-16 DIAGNOSIS — F3340 Major depressive disorder, recurrent, in remission, unspecified: Secondary | ICD-10-CM | POA: Diagnosis not present

## 2018-03-30 ENCOUNTER — Ambulatory Visit (INDEPENDENT_AMBULATORY_CARE_PROVIDER_SITE_OTHER): Payer: Medicare HMO | Admitting: Otolaryngology

## 2018-03-30 DIAGNOSIS — R05 Cough: Secondary | ICD-10-CM

## 2018-03-30 DIAGNOSIS — R490 Dysphonia: Secondary | ICD-10-CM | POA: Diagnosis not present

## 2018-03-30 DIAGNOSIS — K219 Gastro-esophageal reflux disease without esophagitis: Secondary | ICD-10-CM | POA: Diagnosis not present

## 2018-04-03 ENCOUNTER — Ambulatory Visit: Payer: Medicare HMO | Admitting: Allergy and Immunology

## 2018-04-14 DIAGNOSIS — R0982 Postnasal drip: Secondary | ICD-10-CM | POA: Diagnosis not present

## 2018-04-14 DIAGNOSIS — J309 Allergic rhinitis, unspecified: Secondary | ICD-10-CM | POA: Diagnosis not present

## 2018-04-17 ENCOUNTER — Other Ambulatory Visit: Payer: Self-pay | Admitting: Allergy and Immunology

## 2018-04-21 ENCOUNTER — Other Ambulatory Visit: Payer: Self-pay | Admitting: Allergy and Immunology

## 2018-04-21 DIAGNOSIS — J4540 Moderate persistent asthma, uncomplicated: Secondary | ICD-10-CM

## 2018-05-11 ENCOUNTER — Ambulatory Visit (INDEPENDENT_AMBULATORY_CARE_PROVIDER_SITE_OTHER): Payer: Medicare HMO | Admitting: Otolaryngology

## 2018-05-11 DIAGNOSIS — R05 Cough: Secondary | ICD-10-CM

## 2018-06-08 ENCOUNTER — Ambulatory Visit (INDEPENDENT_AMBULATORY_CARE_PROVIDER_SITE_OTHER): Payer: Medicare HMO | Admitting: Otolaryngology

## 2018-06-08 DIAGNOSIS — R05 Cough: Secondary | ICD-10-CM | POA: Diagnosis not present

## 2018-06-08 DIAGNOSIS — R490 Dysphonia: Secondary | ICD-10-CM

## 2018-06-23 DIAGNOSIS — F325 Major depressive disorder, single episode, in full remission: Secondary | ICD-10-CM | POA: Diagnosis not present

## 2018-06-23 DIAGNOSIS — J4540 Moderate persistent asthma, uncomplicated: Secondary | ICD-10-CM | POA: Diagnosis not present

## 2018-06-23 DIAGNOSIS — F3340 Major depressive disorder, recurrent, in remission, unspecified: Secondary | ICD-10-CM | POA: Diagnosis not present

## 2018-06-26 ENCOUNTER — Other Ambulatory Visit: Payer: Self-pay | Admitting: Allergy and Immunology

## 2018-06-26 ENCOUNTER — Other Ambulatory Visit: Payer: Self-pay | Admitting: *Deleted

## 2018-06-26 MED ORDER — ALBUTEROL SULFATE HFA 108 (90 BASE) MCG/ACT IN AERS: INHALATION_SPRAY | RESPIRATORY_TRACT | 1 refills | Status: DC

## 2018-08-14 ENCOUNTER — Encounter: Payer: Self-pay | Admitting: Allergy and Immunology

## 2018-08-14 ENCOUNTER — Ambulatory Visit: Payer: Medicare HMO | Admitting: Allergy and Immunology

## 2018-08-14 VITALS — BP 112/76 | HR 76 | Resp 16 | Ht 61.0 in | Wt 161.0 lb

## 2018-08-14 DIAGNOSIS — R05 Cough: Secondary | ICD-10-CM | POA: Diagnosis not present

## 2018-08-14 DIAGNOSIS — J4541 Moderate persistent asthma with (acute) exacerbation: Secondary | ICD-10-CM

## 2018-08-14 DIAGNOSIS — K219 Gastro-esophageal reflux disease without esophagitis: Secondary | ICD-10-CM | POA: Diagnosis not present

## 2018-08-14 DIAGNOSIS — J3089 Other allergic rhinitis: Secondary | ICD-10-CM

## 2018-08-14 DIAGNOSIS — R053 Chronic cough: Secondary | ICD-10-CM

## 2018-08-14 DIAGNOSIS — J4540 Moderate persistent asthma, uncomplicated: Secondary | ICD-10-CM

## 2018-08-14 MED ORDER — PANTOPRAZOLE SODIUM 40 MG PO TBEC: DELAYED_RELEASE_TABLET | ORAL | 5 refills | Status: DC

## 2018-08-14 MED ORDER — BUDESONIDE-FORMOTEROL FUMARATE 160-4.5 MCG/ACT IN AERO: INHALATION_SPRAY | RESPIRATORY_TRACT | 4 refills | Status: DC

## 2018-08-14 MED ORDER — MONTELUKAST SODIUM 10 MG PO TABS: 10.0000 mg | ORAL_TABLET | Freq: Every day | ORAL | 5 refills | Status: DC

## 2018-08-14 MED ORDER — FAMOTIDINE 20 MG PO TABS: 20.0000 mg | ORAL_TABLET | Freq: Two times a day (BID) | ORAL | 5 refills | Status: DC

## 2018-08-14 MED ORDER — TIOTROPIUM BROMIDE MONOHYDRATE 1.25 MCG/ACT IN AERS: 2.0000 | INHALATION_SPRAY | Freq: Every day | RESPIRATORY_TRACT | 3 refills | Status: DC

## 2018-08-14 MED ORDER — AZELASTINE HCL 0.1 % NA SOLN: NASAL | 5 refills | Status: DC

## 2018-08-14 NOTE — Assessment & Plan Note (Signed)
   Prednisone has been provided, 40 mg x3 days, 20 mg x1 day, 10 mg x1 day, then stop.  A prescription has been provided for Spiriva Respimat 1.25 g, 2 inhalations daily.  For now, continue Symbicort 160-4.5 g, 2 inhalations via spacer device twice a day, montelukast 10 mg daily at bedtime, and albuterol every 4-6 hours if needed.  The patient has been asked to contact me if her symptoms persist or progress. Otherwise, she may return for follow up in 5 months.

## 2018-08-14 NOTE — Patient Instructions (Addendum)
Moderate persistent asthma  Prednisone has been provided, 40 mg x3 days, 20 mg x1 day, 10 mg x1 day, then stop.  A prescription has been provided for Spiriva Respimat 1.25 g, 2 inhalations daily.  For now, continue Symbicort 160-4.5 g, 2 inhalations via spacer device twice a day, montelukast 10 mg daily at bedtime, and albuterol every 4-6 hours if needed.  The patient has been asked to contact me if her symptoms persist or progress. Otherwise, she may return for follow up in 5 months.  GERD (gastroesophageal reflux disease)  Continue appropriate reflux lifestyle modifications, pantoprazole 40 mg daily, and famotidine 20 mg twice daily.  Perennial allergic rhinitis with a possible nonallergic component  Continue appropriate allergen avoidance measures, montelukast daily, fluticasone nasal spray, and nasal saline irrigation.  A prescription has been provided for azelastine nasal spray, 1-2 sprays per nostril 2 times daily as needed. Proper nasal spray technique has been discussed and demonstrated.   For thick post nasal drainage, add guaifenesin [phone] mg (Mucinex)  twice daily as needed with adequate hydration as discussed.  Cough, persistent Multifactorial.  Continue gabapentin as prescribed.  Treatment plan as outlined above.   Return in about 5 months (around 01/13/2019), or if symptoms worsen or fail to improve.

## 2018-08-14 NOTE — Assessment & Plan Note (Signed)
   Continue appropriate reflux lifestyle modifications, pantoprazole 40 mg daily, and famotidine 20 mg twice daily.

## 2018-08-14 NOTE — Assessment & Plan Note (Signed)
Multifactorial.  Continue gabapentin as prescribed.  Treatment plan as outlined above.

## 2018-08-14 NOTE — Assessment & Plan Note (Signed)
   Continue appropriate allergen avoidance measures, montelukast daily, fluticasone nasal spray, and nasal saline irrigation.  A prescription has been provided for azelastine nasal spray, 1-2 sprays per nostril 2 times daily as needed. Proper nasal spray technique has been discussed and demonstrated.   For thick post nasal drainage, add guaifenesin [phone] mg (Mucinex)  twice daily as needed with adequate hydration as discussed.

## 2018-08-14 NOTE — Progress Notes (Signed)
Follow-up Note  RE: [name] MRN: [id number] DOB: May 01, 1951 Date of Office Visit: 08/14/2018  Primary care provider: Asencion Noble, MD Referring provider: Asencion Noble, MD  History of present illness: [name] is a 68 y.o. female with persistent asthma and a persistent cough presenting today for follow up.  She was last seen in this clinic in August 2019.  She reports that recently she has been coughing and wheezing more frequently.  She is been experiencing asthma symptoms multiple times per day and nocturnal awakenings due to lower respiratory symptoms 3-4 nights out of the week despite compliance with Symbicort 160-4.5 g, 2 inhalations via spacer device twice daily, and montelukast 10 mg daily at bedtime.  She reports that she has still been experiencing some thick postnasal drainage despite using fluticasone nasal spray daily.  She admits that she has not been using guaifenesin.  Assessment and plan: Moderate persistent asthma  Prednisone has been provided, 40 mg x3 days, 20 mg x1 day, 10 mg x1 day, then stop.  A prescription has been provided for Spiriva Respimat 1.25 g, 2 inhalations daily.  For now, continue Symbicort 160-4.5 g, 2 inhalations via spacer device twice a day, montelukast 10 mg daily at bedtime, and albuterol every 4-6 hours if needed.  The patient has been asked to contact me if her symptoms persist or progress. Otherwise, she may return for follow up in 5 months.  GERD (gastroesophageal reflux disease)  Continue appropriate reflux lifestyle modifications, pantoprazole 40 mg daily, and famotidine 20 mg twice daily.  Perennial allergic rhinitis with a possible nonallergic component  Continue appropriate allergen avoidance measures, montelukast daily, fluticasone nasal spray, and nasal saline irrigation.  A prescription has been provided for azelastine nasal spray, 1-2 sprays per nostril 2 times daily as needed. Proper nasal spray technique has been  discussed and demonstrated.   For thick post nasal drainage, add guaifenesin [phone] mg (Mucinex)  twice daily as needed with adequate hydration as discussed.  Cough, persistent Multifactorial.  Continue gabapentin as prescribed.  Treatment plan as outlined above.   Meds ordered this encounter  Medications  . Tiotropium Bromide Monohydrate (SPIRIVA RESPIMAT) 1.25 MCG/ACT AERS    Sig: Inhale 2 puffs into the lungs daily.    Dispense:  4 g    Refill:  3  . budesonide-formoterol (SYMBICORT) 160-4.5 MCG/ACT inhaler    Sig: INHALE 2 PUFFS INTO THE LUNGS TWICE DAILY. RINSE GARGLE AND SPIT OUT AFTER USE.    Dispense:  10.2 g    Refill:  4  . famotidine (PEPCID) 20 MG tablet    Sig: Take 1 tablet (20 mg total) by mouth 2 (two) times daily.    Dispense:  60 tablet    Refill:  5  . pantoprazole (PROTONIX) 40 MG tablet    Sig: TAKE 1 TABLET BY MOUTH ONCE DAILY 30 MINUTES BEFORE BREAKFAST.    Dispense:  30 tablet    Refill:  5  . montelukast (SINGULAIR) 10 MG tablet    Sig: Take 1 tablet (10 mg total) by mouth at bedtime.    Dispense:  30 tablet    Refill:  5  . azelastine (ASTELIN) 0.1 % nasal spray    Sig: Take 1-2 sprays per nostril twice daily    Dispense:  30 mL    Refill:  5    Diagnostics: Spirometry reveals an FVC of 67% predicted with 120 mL postbronchodilator improvement.     Physical examination: Blood pressure 112/76,  pulse 76, resp. rate 16, height 5\' 1"  (1.549 m), weight 161 lb (73 kg), SpO2 97 %.  General: Alert, interactive, in no acute distress. HEENT: TMs pearly gray, turbinates moderately edematous without discharge, post-pharynx moderately erythematous. Neck: Supple without lymphadenopathy. Lungs: Mildly decreased breath sounds bilaterally without wheezing, rhonchi or rales. CV: Normal S1, S2 without murmurs. Skin: Warm and dry, without lesions or rashes.  The following portions of the patient's history were reviewed and updated as appropriate: allergies,  current medications, past family history, past medical history, past social history, past surgical history and problem list.  Allergies as of 08/14/2018      Reactions   Sulfa Antibiotics Other (See Comments)   Unknown- pt unsure of reaction; believes it may be nausea      Medication List       Accurate as of August 14, 2018  9:46 PM. Always use your most recent med list.        albuterol 108 (90 Base) MCG/ACT inhaler Commonly known as:  VENTOLIN HFA INHALE 2 PUFFS BY MOUTH EVERY 6 HOURS AS NEEDED FOR SHORTNESS OF BREATH OR WHEEZING.   azelastine 0.1 % nasal spray Commonly known as:  ASTELIN Take 1-2 sprays per nostril twice daily   budesonide-formoterol 160-4.5 MCG/ACT inhaler Commonly known as:  SYMBICORT INHALE 2 PUFFS INTO THE LUNGS TWICE DAILY. RINSE GARGLE AND SPIT OUT AFTER USE.   famotidine 20 MG tablet Commonly known as:  PEPCID Take 1 tablet (20 mg total) by mouth 2 (two) times daily.   FLUoxetine 20 MG capsule Commonly known as:  PROZAC Take 1 capsule by mouth daily.   fluticasone 50 MCG/ACT nasal spray Commonly known as:  FLONASE Place 2 sprays into both nostrils 2 (two) times daily.   FLUTTER Devi Use as directed   gabapentin 300 MG capsule Commonly known as:  NEURONTIN   montelukast 10 MG tablet Commonly known as:  SINGULAIR Take 1 tablet (10 mg total) by mouth at bedtime.   nitrofurantoin 100 MG capsule Commonly known as:  MACRODANTIN Take 100 mg by mouth at bedtime.   pantoprazole 40 MG tablet Commonly known as:  PROTONIX TAKE 1 TABLET BY MOUTH ONCE DAILY 30 MINUTES BEFORE BREAKFAST.   Tiotropium Bromide Monohydrate 1.25 MCG/ACT Aers Commonly known as:  SPIRIVA RESPIMAT Inhale 2 puffs into the lungs daily.       Allergies  Allergen Reactions  . Sulfa Antibiotics Other (See Comments)    Unknown- pt unsure of reaction; believes it may be nausea   Review of systems: Review of systems negative except as noted in HPI / PMHx or noted  below: Constitutional: Negative.  HENT: Negative.   Eyes: Negative.  Respiratory: Negative.   Cardiovascular: Negative.  Gastrointestinal: Negative.  Genitourinary: Negative.  Musculoskeletal: Negative.  Neurological: Negative.  Endo/Heme/Allergies: Negative.  Cutaneous: Negative.  Past Medical History:  Diagnosis Date  . Anxiety   . Asthma    had 1 episode 1 year ago-no more problem  . CHF (congestive heart failure) (HCC)    swelling of feet & ankles  . Cough   . Depression    OCD  . GERD (gastroesophageal reflux disease)    severe  . OCD (obsessive compulsive disorder)   . Shortness of breath    with exertion  . Yeast infection 04/09/2014    Family History  Problem Relation Age of Onset  . Asthma Mother   . Macular degeneration Mother   . Hypertension Mother   . Alzheimer's disease Father   .  Other Sister        blood clots  . Other Son        enlarged spleen  . Diabetes Maternal Grandfather   . Diabetes Paternal Grandfather   . Colon cancer Neg Hx     Social History   Socioeconomic History  . Marital status: Widowed    Spouse name: Not on file  . Number of children: 1  . Years of education: Not on file  . Highest education level: Not on file  Occupational History  . Occupation: retired  Scientific laboratory technician  . Financial resource strain: Not on file  . Food insecurity:    Worry: Not on file    Inability: Not on file  . Transportation needs:    Medical: Not on file    Non-medical: Not on file  Tobacco Use  . Smoking status: Passive Smoke Exposure - Never Smoker  . Smokeless tobacco: Never Used  Substance and Sexual Activity  . Alcohol use: No  . Drug use: No  . Sexual activity: Yes    Birth control/protection: Surgical    Comment: tubal  Lifestyle  . Physical activity:    Days per week: Not on file    Minutes per session: Not on file  . Stress: Not on file  Relationships  . Social connections:    Talks on phone: Not on file    Gets together:  Not on file    Attends religious service: Not on file    Active member of club or organization: Not on file    Attends meetings of clubs or organizations: Not on file    Relationship status: Not on file  . Intimate partner violence:    Fear of current or ex partner: Not on file    Emotionally abused: Not on file    Physically abused: Not on file    Forced sexual activity: Not on file  Other Topics Concern  . Not on file  Social History Narrative  . Not on file    I appreciate the opportunity to take part in [place] care. Please do not hesitate to contact me with questions.  Sincerely,   R. Edgar Frisk, MD

## 2018-08-15 ENCOUNTER — Ambulatory Visit: Payer: Medicare HMO | Admitting: Allergy and Immunology

## 2018-08-18 ENCOUNTER — Telehealth: Payer: Self-pay | Admitting: *Deleted

## 2018-08-18 NOTE — Telephone Encounter (Signed)
Patient called requesting a prescription for steroids. Please advise pt requested to be called at work [phone]

## 2018-08-18 NOTE — Telephone Encounter (Signed)
Information discussed with pt. She states that she is feeling better after completing prednisone sample. She wanted to know if she should have more prednisone sent to the pharmacy. Advised that prednisone is typically taken in short courses and that if she is feeling better from the sample she does not need more at this time. Requested call back on Monday if her symptoms worsen or fail to improve. Pt verbalized understanding.

## 2018-09-14 ENCOUNTER — Ambulatory Visit (INDEPENDENT_AMBULATORY_CARE_PROVIDER_SITE_OTHER): Payer: Medicare HMO | Admitting: Otolaryngology

## 2018-09-14 DIAGNOSIS — R05 Cough: Secondary | ICD-10-CM

## 2018-09-25 DIAGNOSIS — H5210 Myopia, unspecified eye: Secondary | ICD-10-CM | POA: Diagnosis not present

## 2018-09-26 DIAGNOSIS — Z0100 Encounter for examination of eyes and vision without abnormal findings: Secondary | ICD-10-CM | POA: Diagnosis not present

## 2018-10-30 ENCOUNTER — Other Ambulatory Visit: Payer: Self-pay | Admitting: Allergy and Immunology

## 2018-10-30 ENCOUNTER — Telehealth: Payer: Self-pay

## 2018-10-30 MED ORDER — CIMETIDINE 400 MG PO TABS: ORAL_TABLET | ORAL | 5 refills | Status: DC

## 2018-10-30 NOTE — Addendum Note (Signed)
Addended by: Lucrezia Starch I on: 10/30/2018 04:11 PM   Modules accepted: Orders

## 2018-10-30 NOTE — Telephone Encounter (Signed)
Cimetidine has been sent in as instructed to the requesting pharmacy.

## 2018-10-30 NOTE — Telephone Encounter (Signed)
[facility] called to request advice on famotidine 20 mg. This medication is on backorder. They wanted to know if you would like to change the medication or just increase the pantoprazole 40 mg that the patient is already on. Please advise and thank you.

## 2018-10-30 NOTE — Telephone Encounter (Signed)
Cimetidine 400 mg 1-2 times daily as needed to replace famotidine. Thanks.

## 2018-12-13 ENCOUNTER — Other Ambulatory Visit: Payer: Self-pay | Admitting: Allergy and Immunology

## 2018-12-21 ENCOUNTER — Ambulatory Visit (INDEPENDENT_AMBULATORY_CARE_PROVIDER_SITE_OTHER): Payer: Medicare HMO | Admitting: Otolaryngology

## 2018-12-21 ENCOUNTER — Other Ambulatory Visit: Payer: Self-pay

## 2018-12-21 DIAGNOSIS — R05 Cough: Secondary | ICD-10-CM

## 2018-12-21 DIAGNOSIS — K319 Disease of stomach and duodenum, unspecified: Secondary | ICD-10-CM

## 2018-12-22 ENCOUNTER — Other Ambulatory Visit: Payer: Self-pay | Admitting: Allergy and Immunology

## 2019-01-08 ENCOUNTER — Other Ambulatory Visit: Payer: Self-pay

## 2019-01-08 ENCOUNTER — Encounter: Payer: Self-pay | Admitting: Allergy and Immunology

## 2019-01-08 ENCOUNTER — Ambulatory Visit (INDEPENDENT_AMBULATORY_CARE_PROVIDER_SITE_OTHER): Payer: Medicare HMO | Admitting: Allergy and Immunology

## 2019-01-08 VITALS — BP 128/76 | HR 63 | Temp 98.1°F | Resp 16 | Ht 60.63 in | Wt 170.0 lb

## 2019-01-08 DIAGNOSIS — J3089 Other allergic rhinitis: Secondary | ICD-10-CM | POA: Diagnosis not present

## 2019-01-08 DIAGNOSIS — J4540 Moderate persistent asthma, uncomplicated: Secondary | ICD-10-CM | POA: Diagnosis not present

## 2019-01-08 DIAGNOSIS — K219 Gastro-esophageal reflux disease without esophagitis: Secondary | ICD-10-CM | POA: Diagnosis not present

## 2019-01-08 DIAGNOSIS — R05 Cough: Secondary | ICD-10-CM | POA: Diagnosis not present

## 2019-01-08 DIAGNOSIS — R053 Chronic cough: Secondary | ICD-10-CM

## 2019-01-08 MED ORDER — ALBUTEROL SULFATE HFA 108 (90 BASE) MCG/ACT IN AERS: INHALATION_SPRAY | RESPIRATORY_TRACT | 1 refills | Status: DC

## 2019-01-08 MED ORDER — BUDESONIDE-FORMOTEROL FUMARATE 160-4.5 MCG/ACT IN AERO: INHALATION_SPRAY | RESPIRATORY_TRACT | 5 refills | Status: DC

## 2019-01-08 NOTE — Assessment & Plan Note (Signed)
   Continue appropriate reflux lifestyle modifications, pantoprazole 40 mg daily, and cimetidine.

## 2019-01-08 NOTE — Patient Instructions (Addendum)
Moderate persistent asthma Significant improvement and currently well-controlled.    For now, continue Symbicort 160-4.5 g, 2 inhalations via spacer device twice a day, Spiriva Respimat 1.25 g, 2 inhalations daily, montelukast 10 mg daily at bedtime, and albuterol every 4-6 hours if needed.  The montelukast boxed warning has been discussed and the patient has verbalized understanding.  Subjective and objective measures of pulmonary function will be followed and the treatment plan will be adjusted accordingly.  Perennial allergic rhinitis with a possible nonallergic component  Continue appropriate allergen avoidance measures, montelukast daily, fluticasone nasal spray, azelastine nasal spray, 1-2 sprays per nostril 2 times daily as needed, and nasal saline irrigation.  For thick post nasal drainage, add guaifenesin [phone] mg (Mucinex)  twice daily as needed with adequate hydration as discussed.  GERD (gastroesophageal reflux disease)  Continue appropriate reflux lifestyle modifications, pantoprazole 40 mg daily, and cimetidine.  Cough, persistent Improved.  Continue treatment plan as outlined above.   Return in about 5 months (around 06/10/2019), or if symptoms worsen or fail to improve.

## 2019-01-08 NOTE — Progress Notes (Signed)
Follow-up Note  RE: [name] MRN: [id number] DOB: [birth year] Date of Office Visit: 01/08/2019  Primary care provider: Asencion Noble, MD Referring provider: Asencion Noble, MD  History of present illness: [name] is a 68 y.o. female with moderate persistent asthma, gastroesophageal reflux, and history of persistent cough presenting today for follow-up.  She was last seen in this clinic on August 14, 2018.  She reports that she is "really amazed" by the symptom improvement she has experienced while using Spiriva 1.25 micro grams, 2 inhalations daily, and Symbicort 160-4.5, 2 inhalations via spacer device twice daily.  She states that even her friends and family members have commented on the resolution of the cough.  Her nasal allergy symptoms are currently well controlled with azelastine nasal spray and fluticasone nasal spray.  Assessment and plan: Moderate persistent asthma Significant improvement and currently well-controlled.    For now, continue Symbicort 160-4.5 g, 2 inhalations via spacer device twice a day, Spiriva Respimat 1.25 g, 2 inhalations daily, montelukast 10 mg daily at bedtime, and albuterol every 4-6 hours if needed.  The montelukast boxed warning has been discussed and the patient has verbalized understanding.  Subjective and objective measures of pulmonary function will be followed and the treatment plan will be adjusted accordingly.  Perennial allergic rhinitis with a possible nonallergic component  Continue appropriate allergen avoidance measures, montelukast daily, fluticasone nasal spray, azelastine nasal spray, 1-2 sprays per nostril 2 times daily as needed, and nasal saline irrigation.  For thick post nasal drainage, add guaifenesin [phone] mg (Mucinex)  twice daily as needed with adequate hydration as discussed.  GERD (gastroesophageal reflux disease)  Continue appropriate reflux lifestyle modifications, pantoprazole 40 mg daily, and cimetidine.   Cough, persistent Improved.  Continue treatment plan as outlined above.   Meds ordered this encounter  Medications  . albuterol (VENTOLIN HFA) 108 (90 Base) MCG/ACT inhaler    Sig: INHALE 2 PUFFS BY MOUTH EVERY 6 HOURS AS NEEDED FOR SHORTNESS OF BREATH OR WHEEZING.    Dispense:  18 g    Refill:  1  . budesonide-formoterol (SYMBICORT) 160-4.5 MCG/ACT inhaler    Sig: INHALE 2 PUFFS INTO THE LUNGS TWICE DAILY. RINSE GARGLE AND SPIT OUT AFTER USE.    Dispense:  10.2 g    Refill:  5    Diagnostics: Spirometry reveals an FVC of 2.18 L and an FEV1 of 1.55 L (75% predicted) with an FEV1 ratio of 92%.  Her FEV1 is improved compared with previous study.  Please see scanned spirometry results for details.    Physical examination: Blood pressure 128/76, pulse 63, temperature 98.1 F (36.7 C), temperature source Oral, resp. rate 16, height 5' 0.63" (1.54 m), weight 170 lb (77.1 kg), SpO2 99 %.  General: Alert, interactive, in no acute distress. HEENT: TMs pearly gray, turbinates minimally edematous without discharge, post-pharynx unremarkable. Neck: Supple without lymphadenopathy. Lungs: Clear to auscultation without wheezing, rhonchi or rales. CV: Normal S1, S2 without murmurs. Skin: Warm and dry, without lesions or rashes.  The following portions of the patient's history were reviewed and updated as appropriate: allergies, current medications, past family history, past medical history, past social history, past surgical history and problem list.  Allergies as of 01/08/2019      Reactions   Sulfa Antibiotics Other (See Comments)   Unknown- pt unsure of reaction; believes it may be nausea      Medication List       Accurate as of Malaika 22, 2020  1:19 PM. If you have any questions, ask your nurse or doctor.        STOP taking these medications   famotidine 20 MG tablet Commonly known as: PEPCID Stopped by: Edmonia Lynch, MD     TAKE these medications   albuterol 108 (90  Base) MCG/ACT inhaler Commonly known as: Ventolin HFA INHALE 2 PUFFS BY MOUTH EVERY 6 HOURS AS NEEDED FOR SHORTNESS OF BREATH OR WHEEZING.   azelastine 0.1 % nasal spray Commonly known as: ASTELIN Take 1-2 sprays per nostril twice daily   budesonide-formoterol 160-4.5 MCG/ACT inhaler Commonly known as: Symbicort INHALE 2 PUFFS INTO THE LUNGS TWICE DAILY. RINSE GARGLE AND SPIT OUT AFTER USE.   cimetidine 400 MG tablet Commonly known as: TAGAMET 1 tablet by mouth 1-2 times daily as needed.   FLUoxetine 20 MG capsule Commonly known as: PROZAC Take 1 capsule by mouth daily.   fluticasone 50 MCG/ACT nasal spray Commonly known as: FLONASE Place 2 sprays into both nostrils 2 (two) times daily.   Flutter Devi Use as directed   gabapentin 300 MG capsule Commonly known as: NEURONTIN   montelukast 10 MG tablet Commonly known as: SINGULAIR Take 1 tablet (10 mg total) by mouth at bedtime.   nitrofurantoin 100 MG capsule Commonly known as: MACRODANTIN Take 100 mg by mouth at bedtime.   pantoprazole 40 MG tablet Commonly known as: PROTONIX TAKE 1 TABLET BY MOUTH ONCE DAILY 30 MINUTES BEFORE BREAKFAST.   Spiriva Respimat 1.25 MCG/ACT Aers Generic drug: Tiotropium Bromide Monohydrate INHALE 2 PUFFS BY MOUTH ONCE DAILY       Allergies  Allergen Reactions  . Sulfa Antibiotics Other (See Comments)    Unknown- pt unsure of reaction; believes it may be nausea   Review of systems: Review of systems negative except as noted in HPI / PMHx or noted below: Constitutional: Negative.  HENT: Negative.   Eyes: Negative.  Respiratory: Negative.   Cardiovascular: Negative.  Gastrointestinal: Negative.  Genitourinary: Negative.  Musculoskeletal: Negative.  Neurological: Negative.  Endo/Heme/Allergies: Negative.  Cutaneous: Negative.  Past Medical History:  Diagnosis Date  . Anxiety   . Asthma    had 1 episode 1 year ago-no more problem  . CHF (congestive heart failure) (HCC)     swelling of feet & ankles  . Cough   . Depression    OCD  . GERD (gastroesophageal reflux disease)    severe  . OCD (obsessive compulsive disorder)   . Shortness of breath    with exertion  . Yeast infection 04/09/2014    Family History  Problem Relation Age of Onset  . Asthma Mother   . Macular degeneration Mother   . Hypertension Mother   . Alzheimer's disease Father   . Other Sister        blood clots  . Other Son        enlarged spleen  . Diabetes Maternal Grandfather   . Diabetes Paternal Grandfather   . Colon cancer Neg Hx     Social History   Socioeconomic History  . Marital status: Widowed    Spouse name: Not on file  . Number of children: 1  . Years of education: Not on file  . Highest education level: Not on file  Occupational History  . Occupation: retired  Scientific laboratory technician  . Financial resource strain: Not on file  . Food insecurity    Worry: Not on file    Inability: Not on file  . Transportation needs  Medical: Not on file    Non-medical: Not on file  Tobacco Use  . Smoking status: Passive Smoke Exposure - Never Smoker  . Smokeless tobacco: Never Used  Substance and Sexual Activity  . Alcohol use: No  . Drug use: No  . Sexual activity: Yes    Birth control/protection: Surgical    Comment: tubal  Lifestyle  . Physical activity    Days per week: Not on file    Minutes per session: Not on file  . Stress: Not on file  Relationships  . Social Herbalist on phone: Not on file    Gets together: Not on file    Attends religious service: Not on file    Active member of club or organization: Not on file    Attends meetings of clubs or organizations: Not on file    Relationship status: Not on file  . Intimate partner violence    Fear of current or ex partner: Not on file    Emotionally abused: Not on file    Physically abused: Not on file    Forced sexual activity: Not on file  Other Topics Concern  . Not on file  Social History  Narrative  . Not on file    I appreciate the opportunity to take part in [place] care. Please do not hesitate to contact me with questions.  Sincerely,   R. Edgar Frisk, MD

## 2019-01-08 NOTE — Assessment & Plan Note (Signed)
   Continue appropriate allergen avoidance measures, montelukast daily, fluticasone nasal spray, azelastine nasal spray, 1-2 sprays per nostril 2 times daily as needed, and nasal saline irrigation.  For thick post nasal drainage, add guaifenesin [phone] mg (Mucinex)  twice daily as needed with adequate hydration as discussed.

## 2019-01-08 NOTE — Assessment & Plan Note (Signed)
Improved.  Continue treatment plan as outlined above.

## 2019-01-08 NOTE — Assessment & Plan Note (Addendum)
Significant improvement and currently well-controlled.    For now, continue Symbicort 160-4.5 g, 2 inhalations via spacer device twice a day, Spiriva Respimat 1.25 g, 2 inhalations daily, montelukast 10 mg daily at bedtime, and albuterol every 4-6 hours if needed.  The montelukast boxed warning has been discussed and the patient has verbalized understanding.  Subjective and objective measures of pulmonary function will be followed and the treatment plan will be adjusted accordingly.

## 2019-01-09 ENCOUNTER — Telehealth: Payer: Self-pay | Admitting: Podiatry

## 2019-01-09 NOTE — Telephone Encounter (Signed)
Her doctor sent an order today 01/09/19 and she wants to wait until we receive it to schedule. please check that we have it then schedule. Thanks

## 2019-01-15 ENCOUNTER — Encounter: Payer: Self-pay | Admitting: Podiatry

## 2019-01-15 ENCOUNTER — Other Ambulatory Visit: Payer: Self-pay

## 2019-01-15 ENCOUNTER — Ambulatory Visit: Payer: Medicare HMO | Admitting: Podiatry

## 2019-01-15 VITALS — BP 122/75 | HR 69 | Temp 98.4°F | Resp 16

## 2019-01-15 DIAGNOSIS — M2041 Other hammer toe(s) (acquired), right foot: Secondary | ICD-10-CM

## 2019-01-15 DIAGNOSIS — R2242 Localized swelling, mass and lump, left lower limb: Secondary | ICD-10-CM

## 2019-01-15 DIAGNOSIS — B351 Tinea unguium: Secondary | ICD-10-CM

## 2019-01-15 DIAGNOSIS — I509 Heart failure, unspecified: Secondary | ICD-10-CM | POA: Diagnosis not present

## 2019-01-15 MED ORDER — AMMONIUM LACTATE 12 % EX CREA: TOPICAL_CREAM | CUTANEOUS | 0 refills | Status: DC | PRN

## 2019-01-15 NOTE — Progress Notes (Signed)
Subjective:   Patient ID: [name], female   DOB: 68 y.o.   MRN: [id number]   HPI 68 year old female presents the office today for concerns of the right second digit toenail becoming thick, discolored which is been ongoing for the last 1 year.  She states that she tried to trim the nail but the nail is thick.  She states that it becomes tender at times but she denies any redness or drainage or any swelling.  She also asking for a moisturizer for her dry skin.  Denies any skin fissures or open sores.   Review of Systems  All other systems reviewed and are negative.  Past Medical History:  Diagnosis Date  . Anxiety   . Asthma    had 1 episode 1 year ago-no more problem  . CHF (congestive heart failure) (HCC)    swelling of feet & ankles  . Cough   . Depression    OCD  . GERD (gastroesophageal reflux disease)    severe  . OCD (obsessive compulsive disorder)   . Shortness of breath    with exertion  . Yeast infection 04/09/2014    Past Surgical History:  Procedure Laterality Date  . CARPAL TUNNEL RELEASE  03/22/2012   Procedure: CARPAL TUNNEL RELEASE;  Surgeon: Wynonia Sours, MD;  Location: [facility];  Service: Orthopedics;  Laterality: Right;  right carpal tunnel release  . COLONOSCOPY  06/19/2012   Dr. Rourk:normal rectum and colon   . ESOPHAGOGASTRODUODENOSCOPY N/A 12/26/2015   Dr. Gala Romney: normal   . TONSILLECTOMY    . TUBAL LIGATION       Current Outpatient Medications:  .  albuterol (VENTOLIN HFA) 108 (90 Base) MCG/ACT inhaler, INHALE 2 PUFFS BY MOUTH EVERY 6 HOURS AS NEEDED FOR SHORTNESS OF BREATH OR WHEEZING., Disp: 18 g, Rfl: 1 .  ammonium lactate (AMLACTIN) 12 % cream, Apply topically as needed for dry skin., Disp: 385 g, Rfl: 0 .  azelastine (ASTELIN) 0.1 % nasal spray, Take 1-2 sprays per nostril twice daily, Disp: 30 mL, Rfl: 5 .  budesonide-formoterol (SYMBICORT) 160-4.5 MCG/ACT inhaler, INHALE 2 PUFFS INTO THE LUNGS TWICE DAILY. RINSE GARGLE AND SPIT  OUT AFTER USE., Disp: 10.2 g, Rfl: 5 .  cimetidine (TAGAMET) 400 MG tablet, 1 tablet by mouth 1-2 times daily as needed., Disp: 60 tablet, Rfl: 5 .  FLUoxetine (PROZAC) 20 MG capsule, Take 1 capsule by mouth daily., Disp: , Rfl:  .  fluticasone (FLONASE) 50 MCG/ACT nasal spray, Place 2 sprays into both nostrils 2 (two) times daily., Disp: 16 g, Rfl: 5 .  gabapentin (NEURONTIN) 300 MG capsule, , Disp: , Rfl:  .  montelukast (SINGULAIR) 10 MG tablet, Take 1 tablet (10 mg total) by mouth at bedtime., Disp: 30 tablet, Rfl: 5 .  nitrofurantoin (MACRODANTIN) 100 MG capsule, Take 100 mg by mouth at bedtime., Disp: , Rfl:  .  pantoprazole (PROTONIX) 40 MG tablet, TAKE 1 TABLET BY MOUTH ONCE DAILY 30 MINUTES BEFORE BREAKFAST., Disp: 30 tablet, Rfl: 1 .  Respiratory Therapy Supplies (FLUTTER) DEVI, Use as directed, Disp: 1 each, Rfl: 3 .  SPIRIVA RESPIMAT 1.25 MCG/ACT AERS, INHALE 2 PUFFS BY MOUTH ONCE DAILY, Disp: 4 g, Rfl: 2  Allergies  Allergen Reactions  . Sulfa Antibiotics Other (See Comments)    Unknown- pt unsure of reaction; believes it may be nausea         Objective:  Physical Exam  General: AAO x3, NAD  Dermatological: The right  second digit toenail is hypertrophic, dystrophic with yellow-brown discoloration.  There is currently no pain the nail there is no redness or drainage or any signs of infection.  Dry skin present without any skin fissures or open sores or drainage.  Vascular: Dorsalis Pedis artery and Posterior Tibial artery pedal pulses are 2/4 bilateral with immedate capillary fill time.  There is no pain with calf compression, swelling, warmth, erythema.   Neruologic: Grossly intact via light touch bilateral.Protective threshold with Semmes Wienstein monofilament intact to all pedal sites bilateral.   Musculoskeletal: Hammertoe deformities present of the right second toe.  Mild erythema the dorsal PIPJ from irritation site shoes.  Chronic swelling to the left foot after she  had a stress fracture several years ago.  No erythema or warmth of any pain to the left foot.  Muscular strength 5/5 in all groups tested bilateral.  Gait: Unassisted, Nonantalgic.       Assessment:   Right second digit hammertoe resulting in onychodystrophy with onychomycosis on the second digit toenail    Plan:  -Treatment options discussed including all alternatives, risks, and complications -Etiology of symptoms were discussed -Prescribed AmLactin. -I debrided his right second toenail with any complications.  I dispensed a toe crest to help hold the toe in a more rectus position.  Also I am going to order a compound cream for onychomycosis to help with the nail fungus as well as the thickening of the nail.  Discussed total removal if needed however today she wants to hold off on this but there is no improvement or if there is any worsening we will remove the nail.  Trula Slade DPM

## 2019-01-18 ENCOUNTER — Other Ambulatory Visit: Payer: Self-pay | Admitting: Allergy and Immunology

## 2019-01-22 ENCOUNTER — Telehealth: Payer: Self-pay | Admitting: *Deleted

## 2019-01-22 DIAGNOSIS — R2242 Localized swelling, mass and lump, left lower limb: Secondary | ICD-10-CM

## 2019-01-22 NOTE — Telephone Encounter (Signed)
Pt states the compression bandage given for her left foot continues to roll down and would like to have a knee-high compression sock.

## 2019-01-22 NOTE — Telephone Encounter (Signed)
I informed pt a low grade compression knee high stocking could be called to a medical supply store. Pt states she would like the rx called to [facility]. Faxed rx for 15-16mmHg knee high compression stocking for left foot to [facility].

## 2019-01-23 NOTE — Telephone Encounter (Signed)
Pt called twice for the compression hose, I was able to speak to pt at 11:10am and informed that the orders had been faxed yesterday but did not go through and I had faxed again today this morning.

## 2019-01-23 NOTE — Telephone Encounter (Signed)
[facility] called for the orders and I informed of Dr. Leigh Aurora orders for 15-66mmHg knee high compression hose and that the fax did go through this morning at 9:48am.

## 2019-02-15 ENCOUNTER — Other Ambulatory Visit: Payer: Self-pay | Admitting: Allergy and Immunology

## 2019-02-16 ENCOUNTER — Other Ambulatory Visit: Payer: Self-pay | Admitting: Allergy and Immunology

## 2019-02-19 ENCOUNTER — Ambulatory Visit (INDEPENDENT_AMBULATORY_CARE_PROVIDER_SITE_OTHER): Payer: Medicare HMO | Admitting: Otolaryngology

## 2019-02-19 DIAGNOSIS — K219 Gastro-esophageal reflux disease without esophagitis: Secondary | ICD-10-CM | POA: Diagnosis not present

## 2019-02-19 DIAGNOSIS — R05 Cough: Secondary | ICD-10-CM | POA: Diagnosis not present

## 2019-02-22 ENCOUNTER — Telehealth: Payer: Self-pay | Admitting: *Deleted

## 2019-02-22 NOTE — Telephone Encounter (Signed)
Pt called twice states she needs to talk about her feet.

## 2019-02-22 NOTE — Telephone Encounter (Signed)
I called pt and she states she misplaced the rubber lift she wears for the toe and would like a new one. I told pt she could come in the office an pick up a new one 7:30am to 5:00pm Friday or Monday 8:00am to 5:00pm. Pt asked if the cost for the compression stocking made a difference in the treatment. I told pt it was the compression grade that made the difference. Pt asked if it was okay to wear flip-flops, Dr. Jacqualyn Posey had said it caused the toes to grip the shoe. I told her is was best not to wear flip-flops because the toes curled to hold on the shoes.

## 2019-02-24 ENCOUNTER — Encounter (HOSPITAL_COMMUNITY): Payer: Self-pay | Admitting: Emergency Medicine

## 2019-02-24 ENCOUNTER — Emergency Department (HOSPITAL_COMMUNITY)
Admission: EM | Admit: 2019-02-24 | Discharge: 2019-02-24 | Disposition: A | Discharge status: Home / Self Care | Payer: Medicare HMO | Attending: Emergency Medicine | Admitting: Emergency Medicine

## 2019-02-24 ENCOUNTER — Other Ambulatory Visit: Payer: Self-pay

## 2019-02-24 ENCOUNTER — Emergency Department (HOSPITAL_COMMUNITY): Payer: Medicare HMO

## 2019-02-24 DIAGNOSIS — Z7722 Contact with and (suspected) exposure to environmental tobacco smoke (acute) (chronic): Secondary | ICD-10-CM | POA: Insufficient documentation

## 2019-02-24 DIAGNOSIS — Z20828 Contact with and (suspected) exposure to other viral communicable diseases: Secondary | ICD-10-CM | POA: Insufficient documentation

## 2019-02-24 DIAGNOSIS — Z79899 Other long term (current) drug therapy: Secondary | ICD-10-CM | POA: Insufficient documentation

## 2019-02-24 DIAGNOSIS — R112 Nausea with vomiting, unspecified: Secondary | ICD-10-CM | POA: Diagnosis not present

## 2019-02-24 DIAGNOSIS — R05 Cough: Secondary | ICD-10-CM

## 2019-02-24 DIAGNOSIS — R1110 Vomiting, unspecified: Secondary | ICD-10-CM | POA: Insufficient documentation

## 2019-02-24 DIAGNOSIS — R509 Fever, unspecified: Secondary | ICD-10-CM | POA: Diagnosis not present

## 2019-02-24 DIAGNOSIS — R059 Cough, unspecified: Secondary | ICD-10-CM

## 2019-02-24 DIAGNOSIS — R9431 Abnormal electrocardiogram [ECG] [EKG]: Secondary | ICD-10-CM | POA: Diagnosis not present

## 2019-02-24 LAB — CBC WITH DIFFERENTIAL/PLATELET
Abs Immature Granulocytes: 0.15 10*3/uL — ABNORMAL HIGH (ref 0.00–0.07)
Basophils Absolute: 0 10*3/uL (ref 0.0–0.1)
Basophils Relative: 0 %
Eosinophils Absolute: 0 10*3/uL (ref 0.0–0.5)
Eosinophils Relative: 0 %
HCT: 42.1 % (ref 36.0–46.0)
Hemoglobin: 12.9 g/dL (ref 12.0–15.0)
Immature Granulocytes: 1 %
Lymphocytes Relative: 6 %
Lymphs Abs: 0.7 10*3/uL (ref 0.7–4.0)
MCH: 30.4 pg (ref 26.0–34.0)
MCHC: 30.6 g/dL (ref 30.0–36.0)
MCV: 99.1 fL (ref 80.0–100.0)
Monocytes Absolute: 1.2 10*3/uL — ABNORMAL HIGH (ref 0.1–1.0)
Monocytes Relative: 11 %
Neutro Abs: 9.3 10*3/uL — ABNORMAL HIGH (ref 1.7–7.7)
Neutrophils Relative %: 82 %
Platelets: 253 10*3/uL (ref 150–400)
RBC: 4.25 MIL/uL (ref 3.87–5.11)
RDW: 15.5 % (ref 11.5–15.5)
WBC: 11.4 10*3/uL — ABNORMAL HIGH (ref 4.0–10.5)
nRBC: 0 % (ref 0.0–0.2)

## 2019-02-24 LAB — COMPREHENSIVE METABOLIC PANEL
ALT: 17 U/L (ref 0–44)
AST: 25 U/L (ref 15–41)
Albumin: 4.4 g/dL (ref 3.5–5.0)
Alkaline Phosphatase: 60 U/L (ref 38–126)
Anion gap: 8 (ref 5–15)
BUN: 20 mg/dL (ref 8–23)
CO2: 25 mmol/L (ref 22–32)
Calcium: 9.3 mg/dL (ref 8.9–10.3)
Chloride: 104 mmol/L (ref 98–111)
Creatinine, Ser: 0.9 mg/dL (ref 0.44–1.00)
GFR calc Af Amer: >60 mL/min (ref >60)
GFR calc non Af Amer: >60 mL/min (ref >60)
Glucose, Bld: 97 mg/dL (ref 70–99)
Potassium: 4 mmol/L (ref 3.5–5.1)
Sodium: 137 mmol/L (ref 135–145)
Total Bilirubin: 1 mg/dL (ref 0.3–1.2)
Total Protein: 7.4 g/dL (ref 6.5–8.1)

## 2019-02-24 LAB — TROPONIN I (HIGH SENSITIVITY)
Troponin I (High Sensitivity): <2 ng/L (ref <18)
Troponin I (High Sensitivity): <2 ng/L (ref <18)

## 2019-02-24 LAB — URINALYSIS, ROUTINE W REFLEX MICROSCOPIC
Bacteria, UA: NONE SEEN
Bilirubin Urine: NEGATIVE
Glucose, UA: NEGATIVE mg/dL
Hgb urine dipstick: NEGATIVE
Ketones, ur: NEGATIVE mg/dL
Nitrite: NEGATIVE
Protein, ur: NEGATIVE mg/dL
Specific Gravity, Urine: 1.019 (ref 1.005–1.030)
pH: 6 (ref 5.0–8.0)

## 2019-02-24 LAB — LIPASE, BLOOD: Lipase: 45 U/L (ref 11–51)

## 2019-02-24 LAB — SARS CORONAVIRUS 2 BY RT PCR (HOSPITAL ORDER, PERFORMED IN ~~LOC~~ HOSPITAL LAB): SARS Coronavirus 2: NEGATIVE

## 2019-02-24 MED ORDER — ONDANSETRON 4 MG PO TBDP: ORAL_TABLET | ORAL | 0 refills | Status: DC

## 2019-02-24 MED ORDER — ONDANSETRON HCL 4 MG/2ML IJ SOLN
4.0000 mg | Freq: Once | INTRAMUSCULAR | Status: AC
  Administered 2019-02-24: 4 mg via INTRAVENOUS
  Filled 2019-02-24: qty 2

## 2019-02-24 MED ORDER — ONDANSETRON 4 MG PO TBDP: 4.0000 mg | ORAL_TABLET | Freq: Once | ORAL | Status: DC

## 2019-02-24 NOTE — ED Provider Notes (Signed)
[facility] EMERGENCY DEPARTMENT Provider Note   CSN: [id number] Arrival date & time: 02/24/19  1334     History   Chief Complaint Chief Complaint  Patient presents with  . Abdominal Pain    HPI [name] is a 68 y.o. female history of chronic cough who presents the emergency department today with chief complaint of fever and vomiting.  Patient states that she woke this morning with shaking chills, elevated temperature above 100, and vomiting with diarrhea.  She denies abdominal pain.  She states that some of her vomiting is secondary to her heavy cough.  Patient is concerned about coronavirus because she takes care of her 47 year old mother and does not want to spread it.  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  She denies urinary symptoms, recent foreign travel, ingestion of suspicious foods, contacts with similar symptoms.  He has no recent antibiotic use.     HPI  Past Medical History:  Diagnosis Date  . Anxiety   . Asthma    had 1 episode 1 year ago-no more problem  . CHF (congestive heart failure) (HCC)    swelling of feet & ankles  . Cough   . Depression    OCD  . GERD (gastroesophageal reflux disease)    severe  . OCD (obsessive compulsive disorder)   . Shortness of breath    with exertion  . Yeast infection 04/09/2014    Patient Active Problem List   Diagnosis Date Noted  . Hoarseness 03/14/2018  . Perennial allergic rhinitis with a possible nonallergic component 10/04/2017  . Moderate persistent asthma 10/04/2017  . Oropharyngeal dysphagia 05/06/2017  . Esophageal reflux   . Constipation 11/26/2015  . Yeast infection 04/09/2014  . Obesity (BMI 30-39.9) 01/31/2014  . Cough, persistent   . CHF (congestive heart failure) ([place])   . Depression   . Anxiety   . Encounter for screening colonoscopy 05/28/2012  . GERD (gastroesophageal reflux disease)  05/28/2012  . Low back pain 06/17/2011    Past Surgical History:  Procedure Laterality Date  . CARPAL TUNNEL RELEASE  03/22/2012   Procedure: CARPAL TUNNEL RELEASE;  Surgeon: Wynonia Sours, MD;  Location: [place];  Service: Orthopedics;  Laterality: Right;  right carpal tunnel release  . COLONOSCOPY  06/19/2012   Dr. Rourk:normal rectum and colon   . ESOPHAGOGASTRODUODENOSCOPY N/A 12/26/2015   Dr. Gala Romney: normal   . TONSILLECTOMY    . TUBAL LIGATION       OB History    Gravida  1   Para  1   Term      Preterm      AB      Living  1     SAB      TAB      Ectopic      Multiple      Live Births               Home Medications    Prior to Admission medications   Medication Sig Start Date End Date Taking? Authorizing Provider  albuterol (VENTOLIN HFA) 108 (90 Base) MCG/ACT inhaler INHALE 2 PUFFS BY MOUTH EVERY 6 HOURS AS NEEDED FOR SHORTNESS OF BREATH OR WHEEZING OR WHEEZING. 02/16/19   Bobbitt, Sedalia Muta, MD  ammonium lactate (AMLACTIN) 12 % cream Apply topically as needed for dry skin. 01/15/19   Trula Slade,  DPM  azelastine (ASTELIN) 0.1 % nasal spray Take 1-2 sprays per nostril twice daily 08/14/18   Bobbitt, Sedalia Muta, MD  budesonide-formoterol (SYMBICORT) 160-4.5 MCG/ACT inhaler INHALE 2 PUFFS INTO THE LUNGS TWICE DAILY. RINSE GARGLE AND SPIT OUT AFTER USE. 01/08/19   Bobbitt, Sedalia Muta, MD  cimetidine (TAGAMET) 400 MG tablet 1 tablet by mouth 1-2 times daily as needed. 10/30/18   Bobbitt, Sedalia Muta, MD  FLUoxetine (PROZAC) 20 MG capsule Take 1 capsule by mouth daily.    [provider]  fluticasone (FLONASE) 50 MCG/ACT nasal spray Place 2 sprays into both nostrils 2 (two) times daily. 12/17/14   [name] Stain, MD  gabapentin (NEURONTIN) 300 MG capsule  08/10/18   [provider]  montelukast (SINGULAIR) 10 MG tablet TAKE ONE TABLET BY MOUTH AT BEDTIME. 01/18/19   Bobbitt, Sedalia Muta, MD  nitrofurantoin  (MACRODANTIN) 100 MG capsule Take 100 mg by mouth at bedtime.    [provider]  NON [facility]  Antifungal (nail)-#1    [provider]  pantoprazole (PROTONIX) 40 MG tablet TAKE 1 TABLET BY MOUTH ONCE DAILY 30 MINUTES BEFORE BREAKFAST. 12/22/18   Bobbitt, Sedalia Muta, MD  Respiratory Therapy Supplies (FLUTTER) DEVI Use as directed 10/04/17   Bobbitt, Sedalia Muta, MD  SPIRIVA RESPIMAT 1.25 MCG/ACT AERS INHALE 2 PUFFS BY MOUTH ONCE DAILY 02/15/19   Bobbitt, Sedalia Muta, MD    Family History Family History  Problem Relation Age of Onset  . Asthma Mother   . Macular degeneration Mother   . Hypertension Mother   . Alzheimer's disease Father   . Other Sister        blood clots  . Other Son        enlarged spleen  . Diabetes Maternal Grandfather   . Diabetes Paternal Grandfather   . Colon cancer Neg Hx     Social History Social History   Tobacco Use  . Smoking status: Passive Smoke Exposure - Never Smoker  . Smokeless tobacco: Never Used  Substance Use Topics  . Alcohol use: No  . Drug use: No     Allergies   Sulfa antibiotics   Review of Systems Review of Systems Ten systems reviewed and are negative for acute change, except as noted in the HPI.    Physical Exam Updated Vital Signs BP (!) 139/92 (BP Location: Right Arm)   Pulse 72   Temp 98 F (36.7 C) (Oral)   Resp (!) 22   Ht 5\' 1"  (1.549 m)   Wt 76.7 kg   SpO2 98%   BMI 31.93 kg/m   Physical Exam Vitals signs and nursing note reviewed.  Constitutional:      General: She is not in acute distress.    Appearance: She is well-developed. She is not diaphoretic.  HENT:     Head: Normocephalic and atraumatic.  Eyes:     General: No scleral icterus.    Conjunctiva/sclera: Conjunctivae normal.  Neck:     Musculoskeletal: Normal range of motion.  Cardiovascular:     Rate and Rhythm: Normal rate and regular rhythm.     Heart sounds: Normal heart sounds. No murmur. No  friction rub. No gallop.   Pulmonary:     Effort: Pulmonary effort is normal. No respiratory distress.     Breath sounds: Normal breath sounds.  Abdominal:     General: Bowel sounds are normal. There is no distension.     Palpations: Abdomen is soft. There is no  mass.     Tenderness: There is no abdominal tenderness. There is no guarding.  Skin:    General: Skin is warm and dry.  Neurological:     Mental Status: She is alert and oriented to person, place, and time.  Psychiatric:        Behavior: Behavior normal.      ED Treatments / Results  Labs (all labs ordered are listed, but only abnormal results are displayed) Labs Reviewed  URINALYSIS, ROUTINE W REFLEX MICROSCOPIC - Abnormal; Notable for the following components:      Result Value   Leukocytes,Ua TRACE (*)    All other components within normal limits  CBC WITH DIFFERENTIAL/PLATELET - Abnormal; Notable for the following components:   WBC 11.4 (*)    Neutro Abs 9.3 (*)    Monocytes Absolute 1.2 (*)    Abs Immature Granulocytes 0.15 (*)    All other components within normal limits  URINE CULTURE  SARS CORONAVIRUS 2 (HOSPITAL ORDER, [facility])  COMPREHENSIVE METABOLIC PANEL  LIPASE, BLOOD  TROPONIN I (HIGH SENSITIVITY)  TROPONIN I (HIGH SENSITIVITY)    EKG EKG Interpretation  Date/Time:  Saturday February 24 2019 14:43:12 EDT Ventricular Rate:  72 PR Interval:    QRS Duration: 102 QT Interval:  425 QTC Calculation: 466 R Axis:   66 Text Interpretation:  Sinus rhythm Borderline T abnormalities, anterior leads Baseline wander When compared with ECG of 10/20/2012 Nonspecific T wave abnormality is now Present Confirmed by Francine Graven [phone]) on 02/24/2019 2:48:29 PM   Radiology Dg Chest Port 1 View  Result Date: 02/24/2019 CLINICAL DATA:  Fever EXAM: PORTABLE CHEST 1 VIEW COMPARISON:  Nov 29, 2017 FINDINGS: The heart size and mediastinal contours are within normal limits. Both lungs  are clear. The visualized skeletal structures are unremarkable. IMPRESSION: No active disease. Electronically Signed   By: Constance Holster M.D.   On: 02/24/2019 16:01    Procedures Procedures (including critical care time)  Medications Ordered in ED Medications  ondansetron (ZOFRAN) injection 4 mg (4 mg Intravenous Given 02/24/19 1426)     Initial Impression / Assessment and Plan / ED Course  I have reviewed the triage vital signs and the nursing notes.  Pertinent labs & imaging results that were available during my care of the patient were reviewed by me and considered in my medical decision making (see chart for details).  Clinical Course as of Feb 23 1633  Sat Feb 24, 2019  1538 WBC(!): 11.4 [AH]    Clinical Course User Index [AH] Margarita Mail, [place]       Patient here for vomiting chills and elevated temperature.  She concerned for COVID-19 given her cough.  2 high-sensitivity troponins are negative.  Urinalysis is negative.  Coronavirus test negative.  CMP without abnormality.  Lipase within normal limits.  She does have a slightly elevated white blood cell count.  I personally reviewed the patient's 1 view chest x-ray which shows no acute abnormalities.  The patient had some vomiting upon arrival but has no vomiting at this time and is feeling much better.  Do not feel that she needs admission at this time.  Discussed return precautions with the patient.  She is hemodynamically stable, in excellent condition and appears appropriate for discharge at this time.  Final Clinical Impressions(s) / ED Diagnoses   Final diagnoses:  None    ED Discharge Orders    None       Margarita Mail, PA-C  02/24/19 2142    Nat Christen, MD 02/25/19 2047

## 2019-02-24 NOTE — ED Triage Notes (Signed)
Patient c/o lower abd pain with nausea and vomiting. Patient actively vomiting. Denies diarrhea or urinary symptoms. Patient does report low grade fever and chills.

## 2019-02-24 NOTE — Discharge Instructions (Signed)

## 2019-02-25 LAB — URINE CULTURE: Culture: 50000 — AB

## 2019-02-26 ENCOUNTER — Telehealth: Payer: Self-pay | Admitting: Emergency Medicine

## 2019-02-26 NOTE — Telephone Encounter (Signed)
Post ED Visit - Positive Culture Follow-up  Culture report reviewed by antimicrobial stewardship pharmacist: [place] Team []  Elenor Quinones, Pharm.D. []  Heide Guile, Pharm.D., BCPS AQ-ID []  Parks Neptune, Pharm.D., BCPS []  Alycia Rossetti, Pharm.D., BCPS []  [place], Pharm.D., BCPS, AAHIVP []  Legrand Como, Pharm.D., BCPS, AAHIVP [x]  Salome Arnt, PharmD, BCPS []  Johnnette Gourd, PharmD, BCPS []  Hughes Better, PharmD, BCPS []  Leeroy Cha, PharmD []  Laqueta Linden, PharmD, BCPS []  Albertina Parr, PharmD  [place] Team []  Leodis Sias, PharmD []  Lindell Spar, PharmD []  Royetta Asal, PharmD []  Graylin Shiver, Rph []  Rema Fendt) Glennon Mac, PharmD []  Arlyn Dunning, PharmD []  Netta Cedars, PharmD []  Dia Sitter, PharmD []  Leone Haven, PharmD []  Gretta Arab, PharmD []  Theodis Shove, PharmD []  Peggyann Juba, PharmD []  Reuel Boom, PharmD   Positive urine culture Treated with none, asymptomatic,and no further patient follow-up is required at this time.  Hazle Nordmann 02/26/2019, 11:49 AM

## 2019-03-19 ENCOUNTER — Ambulatory Visit: Payer: Medicare HMO | Admitting: Podiatry

## 2019-03-19 ENCOUNTER — Other Ambulatory Visit: Payer: Self-pay

## 2019-03-19 DIAGNOSIS — B351 Tinea unguium: Secondary | ICD-10-CM | POA: Diagnosis not present

## 2019-03-19 DIAGNOSIS — R609 Edema, unspecified: Secondary | ICD-10-CM | POA: Diagnosis not present

## 2019-03-19 NOTE — Progress Notes (Signed)
Subjective: 68 year old female presents the office today for evaluation of toenail fungus.  She is been using topical antifungal this is been doing well.  She is also in the compression stocking the left side which has been helpful.  She does get some generalized achiness to her legs at nighttime.  She is able to walk over a mile without pain to her legs.  She denies any other claudication symptoms.  She states that she recently had a virus and she was seen in the emergency department and had an EKG done and her heart was fine. On the problem list she has been diagnosed with congestive heart failure but she states that this was mild and old.  Denies any systemic complaints such as fevers, chills, nausea, vomiting. No acute changes since last appointment, and no other complaints at this time.   Objective: AAO x3, NAD DP/PT pulses palpable bilaterally, CRT less than 3 seconds Mild pitting edema to bilateral lower extremities.  There is no erythema or warmth at night to elicit any tenderness.  No pain with calf compression, erythema in the calf is supple. To the right second toenail is hypertrophic, dystrophic with brown discoloration.  Mild dystrophy of the bilateral fifth digit toenails as well the other nails.  No significant pain the nail there is no redness drainage or signs of infection. No pain with calf compression, swelling, warmth, erythema  Assessment: Onychomycosis, swelling  Plan: -All treatment options discussed with the patient including all alternatives, risks, complications.  -Continue compression socks and elevation.  Encouraged her to follow-up with her primary care physician regards to swelling as well. -I debrided the nails x10 without any complications or bleeding as a courtesy.  Also continue topical antifungal. -Patient encouraged to call the office with any questions, concerns, change in symptoms.   Trula Slade DPM

## 2019-03-30 ENCOUNTER — Telehealth: Payer: Self-pay | Admitting: *Deleted

## 2019-03-30 DIAGNOSIS — R609 Edema, unspecified: Secondary | ICD-10-CM

## 2019-03-30 NOTE — Telephone Encounter (Signed)
Pt states she has support hose that work fine but her legs ache at night and she would like to know what to do.

## 2019-04-03 ENCOUNTER — Other Ambulatory Visit: Payer: Self-pay | Admitting: Allergy and Immunology

## 2019-04-03 NOTE — Telephone Encounter (Signed)
Left message for pt to call to discuss information and instructions from Dr. Jacqualyn Posey.

## 2019-04-03 NOTE — Telephone Encounter (Signed)
Faxed orders on required form to [facility], with clinicals and demographics.

## 2019-04-03 NOTE — Telephone Encounter (Signed)
Can you please order a venous reflux study to check for venous insuffiencey?

## 2019-04-04 ENCOUNTER — Telehealth (HOSPITAL_COMMUNITY): Payer: Self-pay | Admitting: *Deleted

## 2019-04-04 NOTE — Telephone Encounter (Signed)
Attempted to reach pt on home and cell to schedule study.  Voicemail full/not set up on cell.

## 2019-04-11 ENCOUNTER — Ambulatory Visit (HOSPITAL_COMMUNITY)
Admission: RE | Admit: 2019-04-11 | Discharge: 2019-04-11 | Disposition: A | Discharge status: Home / Self Care | Payer: Medicare HMO | Source: Ambulatory Visit | Attending: Family | Admitting: Family

## 2019-04-11 ENCOUNTER — Other Ambulatory Visit: Payer: Self-pay

## 2019-04-11 DIAGNOSIS — R609 Edema, unspecified: Secondary | ICD-10-CM | POA: Insufficient documentation

## 2019-04-12 ENCOUNTER — Telehealth: Payer: Self-pay | Admitting: *Deleted

## 2019-04-12 DIAGNOSIS — R2242 Localized swelling, mass and lump, left lower limb: Secondary | ICD-10-CM

## 2019-04-12 DIAGNOSIS — R609 Edema, unspecified: Secondary | ICD-10-CM

## 2019-04-12 NOTE — Telephone Encounter (Signed)
-----   Message from Trula Slade, DPM sent at 04/12/2019  7:08 AM EDT ----- Val- the ultrasound did shoe chronic venous insuffiencey bilaterally. Can you refer her to [facility] and please let her know? Thanks.

## 2019-04-12 NOTE — Telephone Encounter (Signed)
I called pt on mobile phone and she states [facility] [place] [facility] and I told her I would call again later. Pt states understanding.

## 2019-04-12 NOTE — Telephone Encounter (Signed)
Left message on pt's home phone to call for results.

## 2019-04-12 NOTE — Telephone Encounter (Signed)
Pt called and informed of Dr. Leigh Aurora review of results and gave the [facility] [phone]. [facility].

## 2019-04-13 ENCOUNTER — Other Ambulatory Visit: Payer: Self-pay | Admitting: Allergy and Immunology

## 2019-04-19 DIAGNOSIS — R600 Localized edema: Secondary | ICD-10-CM | POA: Diagnosis not present

## 2019-04-21 IMAGING — DX DG CHEST 2V
2 series · 2 of 2 positions shown · non-contrast
Comparison: Chest x-ray of 11/18/2016

CLINICAL DATA: Cough, shortness of breath, symptoms of reflux

EXAM:
CHEST - 2 VIEW

[chest pa]
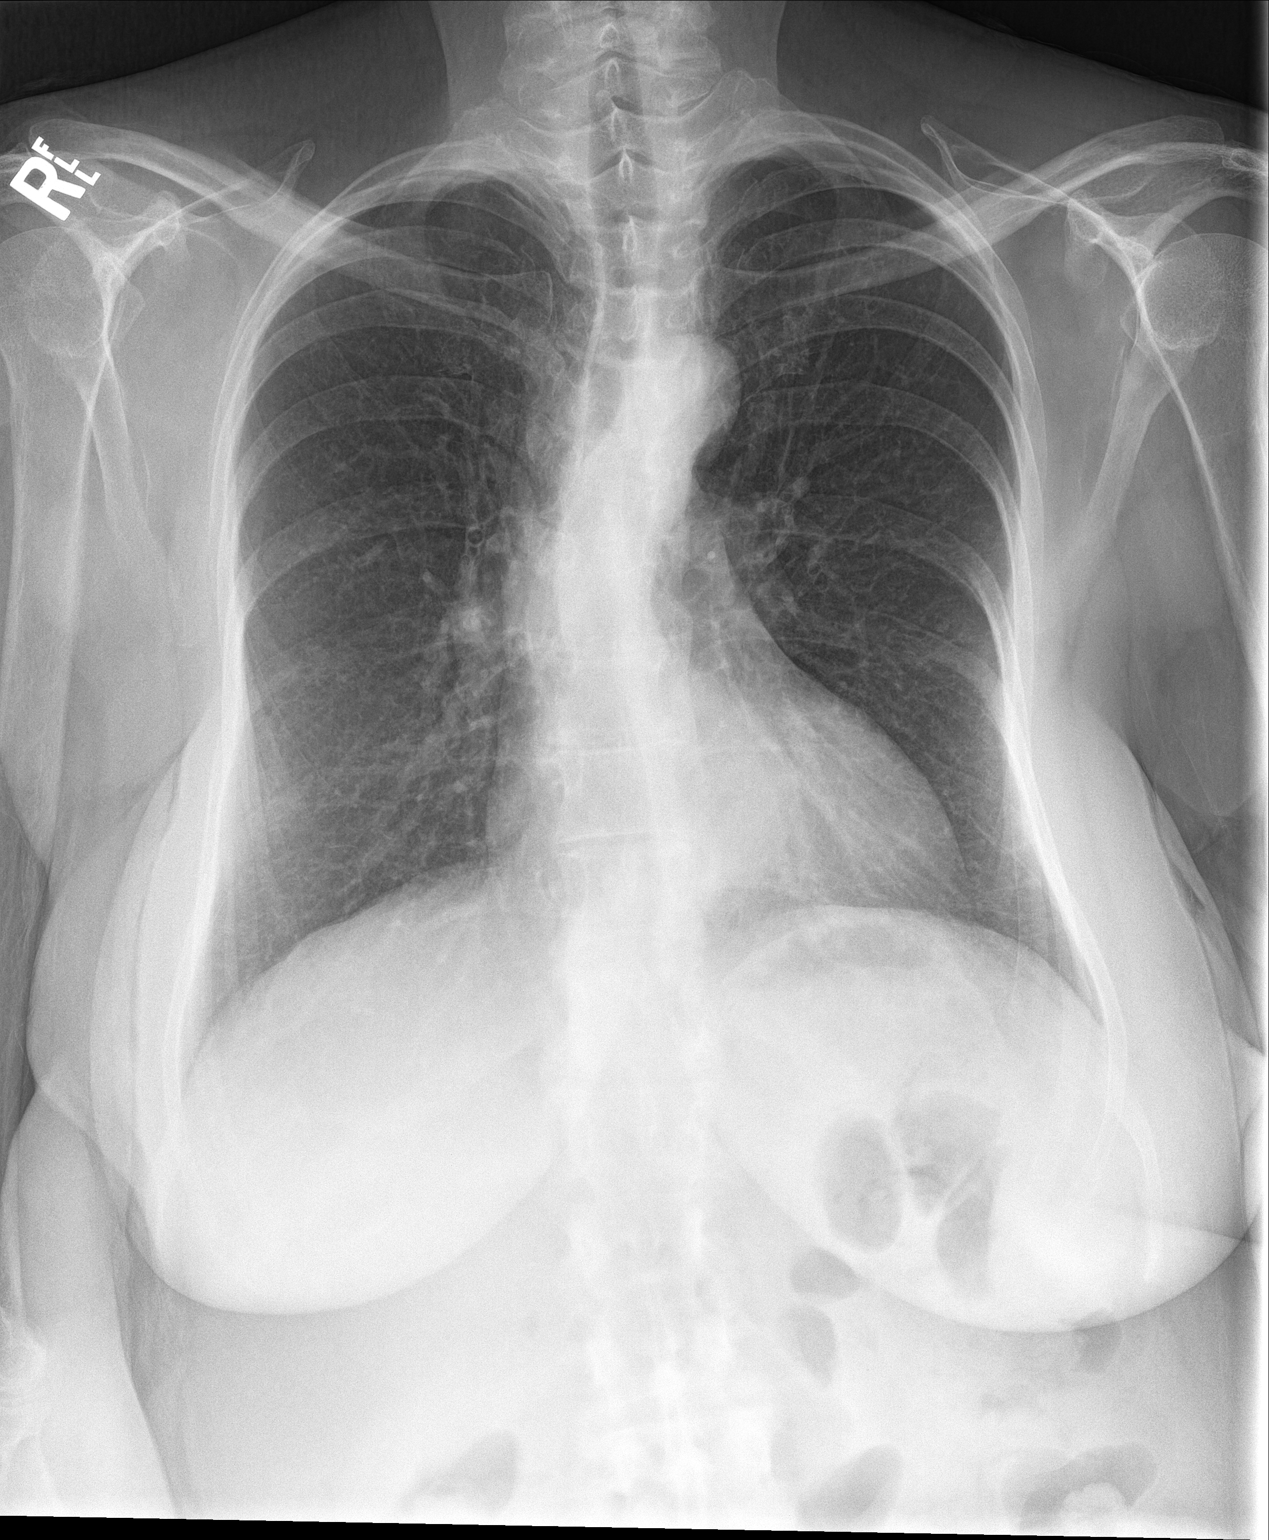

[chest lat]
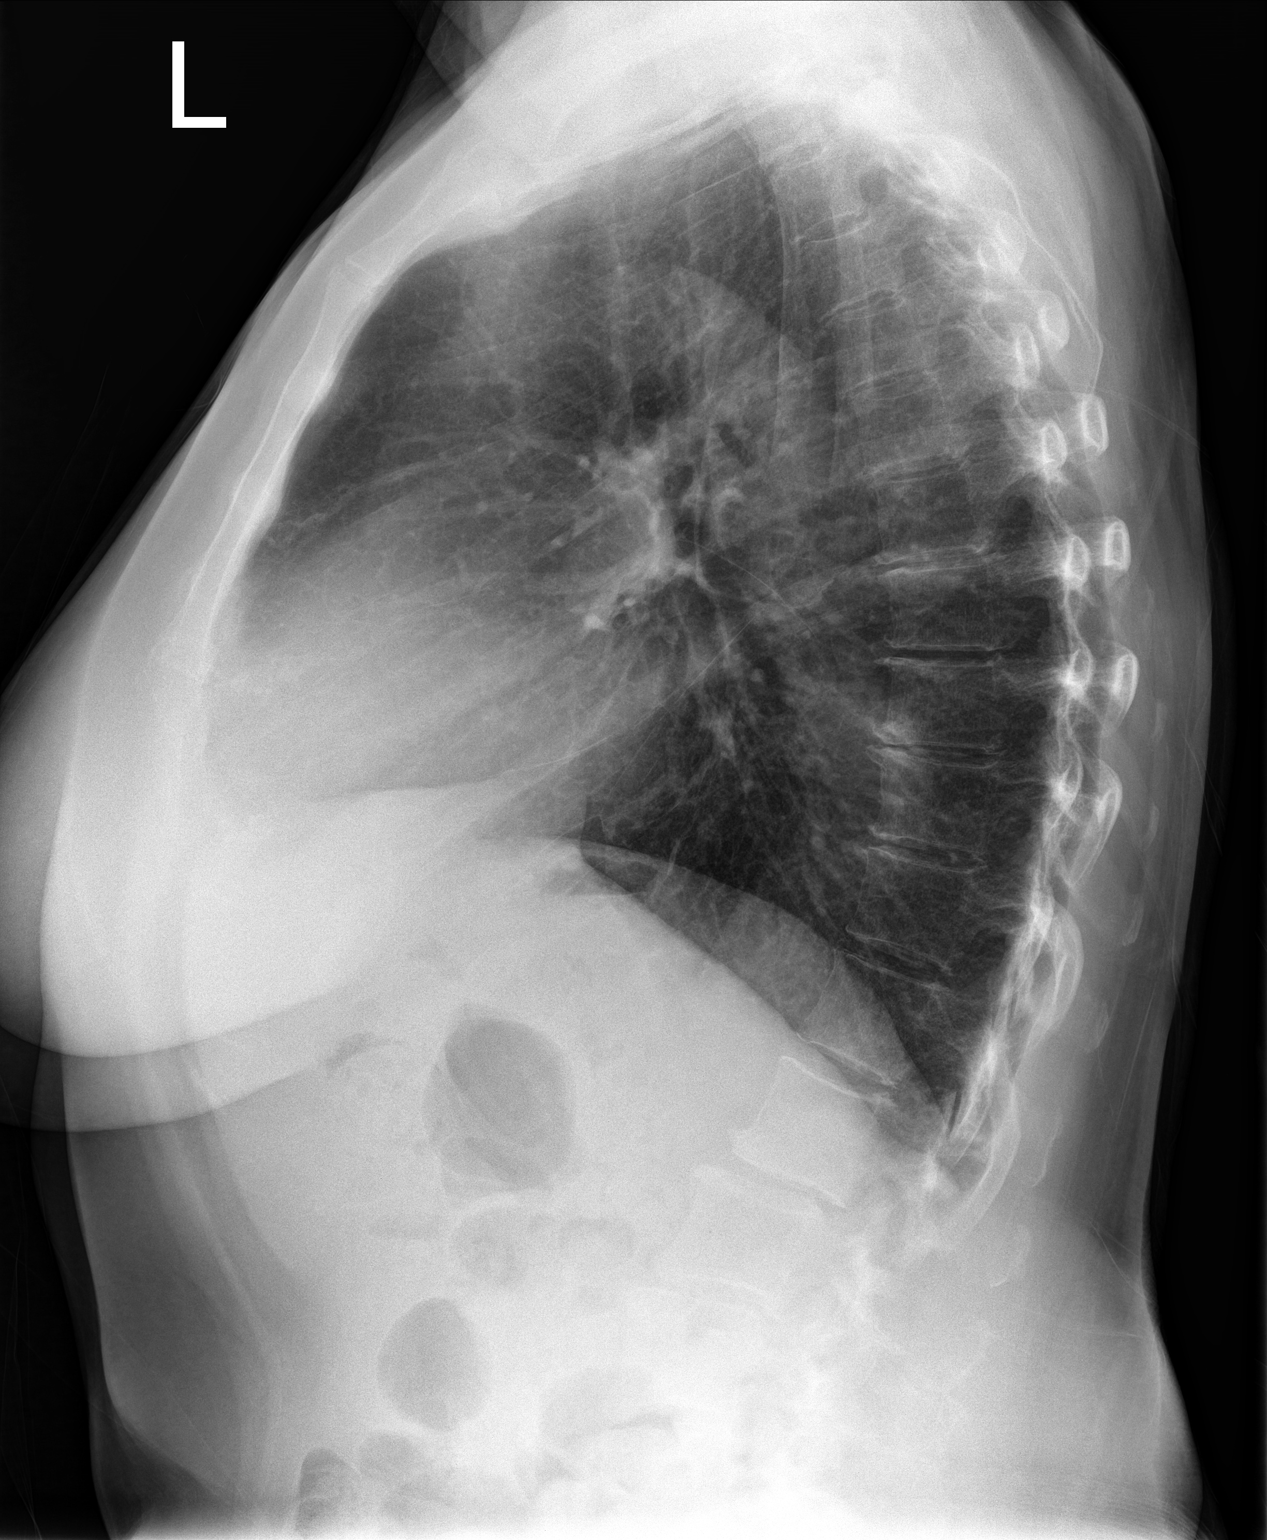

[2 of 2 positions shown; findings below may reference images not displayed]

FINDINGS: The lungs are clear and remain somewhat hyperaerated. Mediastinal
and hilar contours are unremarkable. The heart is within upper
limits normal and the descending thoracic aorta remains ectatic.
There are mild degenerative changes in the mid to lower thoracic
spine.
IMPRESSION: 1. No active lung disease.  No change in slight hyper aeration.
2. Stable ectatic descending thoracic aorta.

## 2019-04-30 DIAGNOSIS — R600 Localized edema: Secondary | ICD-10-CM | POA: Diagnosis not present

## 2019-05-01 DIAGNOSIS — R600 Localized edema: Secondary | ICD-10-CM | POA: Diagnosis not present

## 2019-05-21 ENCOUNTER — Ambulatory Visit (INDEPENDENT_AMBULATORY_CARE_PROVIDER_SITE_OTHER): Payer: Medicare HMO | Admitting: Otolaryngology

## 2019-05-21 DIAGNOSIS — K219 Gastro-esophageal reflux disease without esophagitis: Secondary | ICD-10-CM

## 2019-05-21 DIAGNOSIS — R05 Cough: Secondary | ICD-10-CM | POA: Diagnosis not present

## 2019-05-31 ENCOUNTER — Telehealth: Payer: Self-pay

## 2019-05-31 NOTE — Telephone Encounter (Signed)
The best equivalent to cimetidine is famotidine (Pepcid) 20 mg 1-2 times daily.

## 2019-05-31 NOTE — Telephone Encounter (Signed)
Patient is calling to see if there is something else she can take besides Cimetidine. Her reflux is worse and she needs some relief.  [place]

## 2019-05-31 NOTE — Telephone Encounter (Signed)
Dr Verlin Fester, please advise:

## 2019-06-01 MED ORDER — FAMOTIDINE 20 MG PO TABS: ORAL_TABLET | ORAL | 1 refills | Status: DC

## 2019-06-01 NOTE — Telephone Encounter (Signed)
Called back and was able to leave a voice message for patient to return call.

## 2019-06-01 NOTE — Telephone Encounter (Addendum)
Patient returned call to the office and informed of order per Dr. Verlin Fester.  Patient will stop Cimetidine and start Famotidine 20 mg.  RX sent to [facility].  Patient does have her follow up appointment with Dr. Verlin Fester scheduled for 06/11/19.  Patient voiced understanding.

## 2019-06-01 NOTE — Telephone Encounter (Signed)
Tried calling patient   Number busy

## 2019-06-11 ENCOUNTER — Ambulatory Visit: Payer: Medicare HMO | Admitting: Allergy and Immunology

## 2019-06-11 ENCOUNTER — Encounter: Payer: Self-pay | Admitting: Allergy and Immunology

## 2019-06-11 ENCOUNTER — Other Ambulatory Visit: Payer: Self-pay

## 2019-06-11 VITALS — BP 106/64 | HR 62 | Temp 97.9°F | Resp 18 | Ht 60.5 in | Wt 169.8 lb

## 2019-06-11 DIAGNOSIS — K219 Gastro-esophageal reflux disease without esophagitis: Secondary | ICD-10-CM

## 2019-06-11 DIAGNOSIS — J3089 Other allergic rhinitis: Secondary | ICD-10-CM | POA: Diagnosis not present

## 2019-06-11 DIAGNOSIS — J4540 Moderate persistent asthma, uncomplicated: Secondary | ICD-10-CM | POA: Diagnosis not present

## 2019-06-11 DIAGNOSIS — R05 Cough: Secondary | ICD-10-CM | POA: Diagnosis not present

## 2019-06-11 DIAGNOSIS — R053 Chronic cough: Secondary | ICD-10-CM

## 2019-06-11 MED ORDER — PREDNISONE 1 MG PO TABS: 10.0000 mg | ORAL_TABLET | Freq: Every day | ORAL | Status: DC

## 2019-06-11 NOTE — Assessment & Plan Note (Signed)
Multifactorial.  Gabapentin as prescribed.  Treatment plan as outlined above.

## 2019-06-11 NOTE — Progress Notes (Signed)
Follow-up Note  RE: [name] MRN: [id number] DOB: [birth year] Date of Office Visit: 06/11/2019  Primary care provider: Asencion Noble, MD Referring provider: Asencion Noble, MD  History of present illness: [name] is a 68 y.o. female with persistent asthma, mixed rhinitis, gastroesophageal reflux, and history of persistent cough presenting today for a sick visit.  She was last seen in this clinic on Kadajah 22, 2020.  She reports that when the weather started getting cold and began fluctuating over the past several weeks she has begun experiencing a persistent cough.  She reports that she struggles with a persistent cough this time of the year on a consistent basis.  She has been experiencing increased postnasal drainage and states that the cough seems to originate as a tickle in the base of her throat on many occasions, however on other occasions the cough originates in the chest.  She states that approximately 2 weeks ago she "could not get my breath" which was immediately relieved with albuterol rescue.  She has tried the flutter valve in the past without benefit.  She has also noticed that the cough seems to get worse when she tenses her abdomen to transition from a sitting to standing position, and therefore believes that reflux may be contributing.  Assessment and plan: Moderate persistent asthma  Prednisone has been provided, 40 mg x3 days, 20 mg x1 day, 10 mg x1 day, then stop.  For now, continue Symbicort 160-4.5 g, 2 inhalations via spacer device twice a day, Spiriva Respimat 1.25 g, 2 inhalations daily,  montelukast 10 mg daily at bedtime, and albuterol every 4-6 hours if needed.  The patient has been asked to contact me if her symptoms persist or progress. Otherwise, she may return for follow up in 5 months.  Perennial allergic rhinitis with a possible nonallergic component  Continue appropriate allergen avoidance measures, montelukast daily, fluticasone nasal spray, and nasal  saline irrigation.  Azelastine nasal spray, 1-2 sprays per nostril 2 times daily as needed.   For thick post nasal drainage, add guaifenesin [phone] mg (Mucinex)  twice daily as needed with adequate hydration as discussed.  GERD (gastroesophageal reflux disease)  Continue appropriate reflux lifestyle modifications, pantoprazole 40 mg daily, and famotidine 20 mg twice daily.  Cough, persistent Multifactorial.  Gabapentin as prescribed.  Treatment plan as outlined above.   Meds ordered this encounter  Medications  . predniSONE (DELTASONE) tablet 10 mg    Diagnostics: Spirometry reveals an FVC of 2.25 L and an FEV1 of 1.61 L (83% predicted) without postbronchodilator improvement.  Please see scanned spirometry results for details.    Physical examination: Blood pressure 106/64, pulse 62, temperature 97.9 F (36.6 C), temperature source Temporal, resp. rate 18, height 5' 0.5" (1.537 m), weight 169 lb 12.8 oz (77 kg), SpO2 95 %.  General: Alert, interactive, in no acute distress. HEENT: TMs pearly gray, turbinates moderately edematous without discharge, post-pharynx erythematous. Neck: Supple without lymphadenopathy. Lungs: Clear to auscultation without wheezing, rhonchi or rales. CV: Normal S1, S2 without murmurs. Skin: Warm and dry, without lesions or rashes.  The following portions of the patient's history were reviewed and updated as appropriate: allergies, current medications, past family history, past medical history, past social history, past surgical history and problem list.  Current Outpatient Medications  Medication Sig Dispense Refill  . albuterol (VENTOLIN HFA) 108 (90 Base) MCG/ACT inhaler INHALE 2 PUFFS BY MOUTH EVERY 6 HOURS AS NEEDED FOR SHORTNESS OF BREATH OR WHEEZING. 8 g 1  .  azelastine (ASTELIN) 0.1 % nasal spray Take 1-2 sprays per nostril twice daily 30 mL 5  . budesonide-formoterol (SYMBICORT) 160-4.5 MCG/ACT inhaler INHALE 2 PUFFS INTO THE LUNGS TWICE  DAILY. RINSE GARGLE AND SPIT OUT AFTER USE. 10.2 g 5  . cimetidine (TAGAMET) 800 MG tablet TAKE 1/2 TABLET BY MOUTH ONCE OR TWICE DAILY AS NEEDED 30 tablet 2  . famotidine (PEPCID) 20 MG tablet Take one to two tablets daily. 60 tablet 1  . FLUoxetine (PROZAC) 20 MG capsule Take 1 capsule by mouth daily.    . fluticasone (FLONASE) 50 MCG/ACT nasal spray Place 2 sprays into both nostrils 2 (two) times daily. 16 g 5  . gabapentin (NEURONTIN) 300 MG capsule     . montelukast (SINGULAIR) 10 MG tablet TAKE ONE TABLET BY MOUTH AT BEDTIME. 30 tablet 5  . nitrofurantoin (MACRODANTIN) 100 MG capsule Take 100 mg by mouth at bedtime.    . NON [facility]  Antifungal (nail)-#1    . Respiratory Therapy Supplies (FLUTTER) DEVI Use as directed 1 each 3  . SF 5000 PLUS 1.1 % CREA dental cream USE AS DIRECTED TO BRUSH TEETH AT BEDTIME    . Spacer/Aero-Holding Chambers (AEROCHAMBER PLUS WITH MASK) inhaler USE AS DIRECTED 1 each 0  . SPIRIVA RESPIMAT 1.25 MCG/ACT AERS INHALE 2 PUFFS BY MOUTH ONCE DAILY 4 g 3   Current Facility-Administered Medications  Medication Dose Route Frequency Provider Last Rate Last Dose  . [START ON 06/12/2019] predniSONE (DELTASONE) tablet 10 mg  10 mg Oral Q breakfast Lucille Crichlow, Sedalia Muta, MD        Allergies  Allergen Reactions  . Sulfa Antibiotics Other (See Comments)    Unknown- pt unsure of reaction; believes it may be nausea    Review of systems: Review of systems negative except as noted in HPI / PMHx or noted below: Constitutional: Negative.  HENT: Negative.   Eyes: Negative.  Respiratory: Negative.   Cardiovascular: Negative.  Gastrointestinal: Negative.  Genitourinary: Negative.  Musculoskeletal: Negative.  Neurological: Negative.  Endo/Heme/Allergies: Negative.  Cutaneous: Negative.  Past Medical History:  Diagnosis Date  . Anxiety   . Asthma    had 1 episode 1 year ago-no more problem  . CHF (congestive heart failure) (HCC)     swelling of feet & ankles  . Cough   . Depression    OCD  . GERD (gastroesophageal reflux disease)    severe  . OCD (obsessive compulsive disorder)   . Shortness of breath    with exertion  . Yeast infection 04/09/2014    Family History  Problem Relation Age of Onset  . Asthma Mother   . Macular degeneration Mother   . Hypertension Mother   . Alzheimer's disease Father   . Other Sister        blood clots  . Other Son        enlarged spleen  . Diabetes Maternal Grandfather   . Diabetes Paternal Grandfather   . Colon cancer Neg Hx     Social History   Socioeconomic History  . Marital status: Widowed    Spouse name: Not on file  . Number of children: 1  . Years of education: Not on file  . Highest education level: Not on file  Occupational History  . Occupation: retired  Scientific laboratory technician  . Financial resource strain: Not on file  . Food insecurity    Worry: Not on file    Inability: Not on file  . Transportation needs  Medical: Not on file    Non-medical: Not on file  Tobacco Use  . Smoking status: Passive Smoke Exposure - Never Smoker  . Smokeless tobacco: Never Used  Substance and Sexual Activity  . Alcohol use: No  . Drug use: No  . Sexual activity: Yes    Birth control/protection: Surgical    Comment: tubal  Lifestyle  . Physical activity    Days per week: Not on file    Minutes per session: Not on file  . Stress: Not on file  Relationships  . Social Herbalist on phone: Not on file    Gets together: Not on file    Attends religious service: Not on file    Active member of club or organization: Not on file    Attends meetings of clubs or organizations: Not on file    Relationship status: Not on file  . Intimate partner violence    Fear of current or ex partner: Not on file    Emotionally abused: Not on file    Physically abused: Not on file    Forced sexual activity: Not on file  Other Topics Concern  . Not on file  Social History  Narrative  . Not on file    I appreciate the opportunity to take part in [place] care. Please do not hesitate to contact me with questions.  Sincerely,   R. Edgar Frisk, MD

## 2019-06-11 NOTE — Assessment & Plan Note (Signed)
   Prednisone has been provided, 40 mg x3 days, 20 mg x1 day, 10 mg x1 day, then stop.  For now, continue Symbicort 160-4.5 g, 2 inhalations via spacer device twice a day, Spiriva Respimat 1.25 g, 2 inhalations daily,  montelukast 10 mg daily at bedtime, and albuterol every 4-6 hours if needed.  The patient has been asked to contact me if her symptoms persist or progress. Otherwise, she may return for follow up in 5 months.

## 2019-06-11 NOTE — Assessment & Plan Note (Signed)
   Continue appropriate allergen avoidance measures, montelukast daily, fluticasone nasal spray, and nasal saline irrigation.  Azelastine nasal spray, 1-2 sprays per nostril 2 times daily as needed.   For thick post nasal drainage, add guaifenesin [phone] mg (Mucinex)  twice daily as needed with adequate hydration as discussed.

## 2019-06-11 NOTE — Assessment & Plan Note (Signed)
   Continue appropriate reflux lifestyle modifications, pantoprazole 40 mg daily, and famotidine 20 mg twice daily.

## 2019-06-11 NOTE — Patient Instructions (Addendum)
Moderate persistent asthma  Prednisone has been provided, 40 mg x3 days, 20 mg x1 day, 10 mg x1 day, then stop.  For now, continue Symbicort 160-4.5 g, 2 inhalations via spacer device twice a day, Spiriva Respimat 1.25 g, 2 inhalations daily,  montelukast 10 mg daily at bedtime, and albuterol every 4-6 hours if needed.  The patient has been asked to contact me if her symptoms persist or progress. Otherwise, she may return for follow up in 5 months.  Perennial allergic rhinitis with a possible nonallergic component  Continue appropriate allergen avoidance measures, montelukast daily, fluticasone nasal spray, and nasal saline irrigation.  Azelastine nasal spray, 1-2 sprays per nostril 2 times daily as needed.   For thick post nasal drainage, add guaifenesin [id number] mg (Mucinex)  twice daily as needed with adequate hydration as discussed.  GERD (gastroesophageal reflux disease)  Continue appropriate reflux lifestyle modifications, pantoprazole 40 mg daily, and famotidine 20 mg twice daily.  Cough, persistent Multifactorial.  Gabapentin as prescribed.  Treatment plan as outlined above.   Return in about 4 months (around 10/09/2019), or if symptoms worsen or fail to improve.

## 2019-06-18 ENCOUNTER — Other Ambulatory Visit: Payer: Self-pay

## 2019-06-18 ENCOUNTER — Ambulatory Visit: Payer: Medicare HMO | Admitting: Podiatry

## 2019-06-18 DIAGNOSIS — M79675 Pain in left toe(s): Secondary | ICD-10-CM

## 2019-06-18 DIAGNOSIS — B351 Tinea unguium: Secondary | ICD-10-CM

## 2019-06-18 DIAGNOSIS — M79674 Pain in right toe(s): Secondary | ICD-10-CM

## 2019-06-18 DIAGNOSIS — R2242 Localized swelling, mass and lump, left lower limb: Secondary | ICD-10-CM

## 2019-06-18 NOTE — Progress Notes (Signed)
Subjective: 68 y.o. returns the office today for painful, elongated, thickened toenails which she cannot trim herself. Denies any redness or drainage around the nails. She has been using the topical antifungal medication which has been helpful. She is wearing the compression stocking but cuts off below the knee and her knees swell. Denies any systemic complaints such as fevers, chills, nausea, vomiting.   PCP: Asencion Noble, MD   Objective: AAO 3, NAD DP/PT pulses palpable, CRT less than 3 seconds Nails hypertrophic, dystrophic, elongated, brittle, discolored 10. There is tenderness overlying the nails 1-5 bilaterally. There is no surrounding erythema or drainage along the nail sites. No open lesions or pre-ulcerative lesions are identified. No other areas of tenderness bilateral lower extremities. No overlying edema, erythema, increased warmth. No pain with calf compression, swelling, warmth, erythema.  Assessment: Patient presents with symptomatic onychomycosis  Plan: -Treatment options including alternatives, risks, complications were discussed -Nails sharply debrided 10 without complication/bleeding. Can continue topical antifungal.  -Discussed daily foot inspection. If there are any changes, to call the office immediately.  -Follow-up in 3 months or sooner if any problems are to arise. In the meantime, encouraged to call the office with any questions, concerns, changes symptoms.  Celesta Gentile, DPM

## 2019-07-09 ENCOUNTER — Other Ambulatory Visit: Payer: Self-pay | Admitting: Allergy and Immunology

## 2019-07-31 ENCOUNTER — Other Ambulatory Visit: Payer: Self-pay | Admitting: Allergy and Immunology

## 2019-08-06 ENCOUNTER — Other Ambulatory Visit: Payer: Self-pay | Admitting: Allergy and Immunology

## 2019-08-10 ENCOUNTER — Other Ambulatory Visit: Payer: Self-pay | Admitting: Allergy and Immunology

## 2019-09-04 ENCOUNTER — Other Ambulatory Visit: Payer: Self-pay | Admitting: Allergy and Immunology

## 2019-09-17 ENCOUNTER — Encounter: Payer: Self-pay | Admitting: Podiatry

## 2019-09-17 ENCOUNTER — Ambulatory Visit: Payer: Medicare HMO | Admitting: Podiatry

## 2019-09-17 ENCOUNTER — Other Ambulatory Visit: Payer: Self-pay

## 2019-09-17 VITALS — Temp 97.7°F

## 2019-09-17 DIAGNOSIS — M79675 Pain in left toe(s): Secondary | ICD-10-CM

## 2019-09-17 DIAGNOSIS — M79674 Pain in right toe(s): Secondary | ICD-10-CM

## 2019-09-17 DIAGNOSIS — B351 Tinea unguium: Secondary | ICD-10-CM

## 2019-09-17 NOTE — Patient Instructions (Signed)
Look at getting Cetaphil or Eucerin cream for your dry heels. If you notice any opening in the skin or any issues please let me know.

## 2019-09-23 NOTE — Progress Notes (Signed)
Subjective: 69 y.o. returns the office today for painful, elongated, thickened toenails which she cannot trim herself. She is still using the topical antifungal medication which has been helpful. Denies any systemic complaints such as fevers, chills, nausea, vomiting.   PCP: Asencion Noble, MD  Objective: AAO 3, NAD DP/PT pulses palpable, CRT less than 3 seconds Nails hypertrophic, dystrophic, elongated, brittle, discolored 10. There is tenderness overlying the nails 1-5 bilaterally. There is no surrounding erythema or drainage along the nail sites. No open lesions or pre-ulcerative lesions are identified. No other areas of tenderness bilateral lower extremities. No overlying edema, erythema, increased warmth. Dry skin to the heels without open sores, skin fissures.  No pain with calf compression, swelling, warmth, erythema.  Assessment: Patient presents with symptomatic onychomycosis  Plan: -Treatment options including alternatives, risks, complications were discussed -Nails sharply debrided 10 without complication/bleeding. Continue topical antifungal.  -Recommended Eucerin or Cetaphil for the dry skin. Monitor for any skin breakdown.  -Discussed daily foot inspection. If there are any changes, to call the office immediately.  -Follow-up in 3 months or sooner if any problems are to arise. In the meantime, encouraged to call the office with any questions, concerns, changes symptoms.  Celesta Gentile, DPM

## 2019-10-15 ENCOUNTER — Ambulatory Visit: Payer: Medicare HMO | Admitting: Allergy and Immunology

## 2019-10-27 ENCOUNTER — Other Ambulatory Visit: Payer: Self-pay | Admitting: Allergy and Immunology

## 2019-11-05 ENCOUNTER — Ambulatory Visit: Payer: Medicare HMO | Admitting: Allergy and Immunology

## 2019-11-12 ENCOUNTER — Ambulatory Visit: Payer: Medicare HMO | Admitting: Allergy

## 2019-11-12 ENCOUNTER — Other Ambulatory Visit: Payer: Self-pay

## 2019-11-12 ENCOUNTER — Encounter: Payer: Self-pay | Admitting: Allergy

## 2019-11-12 VITALS — BP 108/64 | HR 72 | Temp 97.8°F | Resp 20

## 2019-11-12 DIAGNOSIS — J3089 Other allergic rhinitis: Secondary | ICD-10-CM | POA: Diagnosis not present

## 2019-11-12 DIAGNOSIS — K219 Gastro-esophageal reflux disease without esophagitis: Secondary | ICD-10-CM | POA: Diagnosis not present

## 2019-11-12 DIAGNOSIS — R053 Chronic cough: Secondary | ICD-10-CM

## 2019-11-12 DIAGNOSIS — J4540 Moderate persistent asthma, uncomplicated: Secondary | ICD-10-CM

## 2019-11-12 DIAGNOSIS — R05 Cough: Secondary | ICD-10-CM

## 2019-11-12 NOTE — Assessment & Plan Note (Signed)
Past history - 2019 skin testing positive to mold and dog. Interim history - still having some PND.  Continue environmental control measures.  Continue montelukast 10mg  daily.  Continue fluticasone nasal spray 1-2 spray per nostril once a day for nasal congestion.  May use Azelastine nasal spray, 1-2 sprays per nostril TWICE daily as needed for runny nose.  Nasal saline spray (i.e., Simply Saline) or nasal saline lavage (i.e., NeilMed) is recommended as needed and prior to medicated nasal sprays.

## 2019-11-12 NOTE — Assessment & Plan Note (Signed)
Taking Pepcid 20mg  1-2 times a day and Nexium once a day. Not really following a reflux diet.  Continue appropriate reflux lifestyle modifications.   Continue pantoprazole 40 mg daily, and famotidine 20 mg twice daily.

## 2019-11-12 NOTE — Progress Notes (Signed)
Follow Up Note  RE: [name] MRN: [id number] DOB: Oct 01, 1950 Date of Office Visit: 11/12/2019  Referring provider: Asencion Noble, MD Primary care provider: Asencion Noble, MD  Chief Complaint: Follow-up, Allergies, and Asthma  History of Present Illness: I had the pleasure of seeing [name] for a follow up visit at the [facility] on 11/12/2019. She is a 69 y.o. female, who is being followed for asthma, allergic rhinitis, GERD and coughing. Her previous allergy office visit was on 06/11/2019 with Dr. Verlin Fester. Today is a regular follow up visit.  Moderate persistent asthma Currently on Symbicort 160 2 puffs twice a day and Spiriva 2 puffs daily and Singulair at night.  Using albuterol about once a week for coughing fits with some benefit.   Denies any ER/urgent care visits or prednisone use since the last visit.  Perennial allergic rhinitis with a possible nonallergic component Currently on Flonase 1 spray BID and azelastine 1 spray daily. Still having some PND.   GERD (gastroesophageal reflux disease) Taking Pepcid 20mg  1-2 times a day, Nexium once a day.   Cough Follows with Dr. Arnette Norris and takes gabapentin 300mg  two times a day with some benefit. She was able to go down from TID to BID.   Assessment and Plan: [name] is a 69 y.o. female with: Moderate persistent asthma Still has coughing fits but only using albuterol once a week.  ACT score 20.  Today's spirometry showed some mild restriction. . Daily controller medication(s): continue Symbicort 160 2 puffs twice a day with spacer and rinse mouth afterwards. . Continue Spiriva 1.67mcg 2 puffs daily. . Continue montelukast 10mg  daily.  . Prior to physical activity: May use albuterol rescue inhaler 2 puffs 5 to 15 minutes prior to strenuous physical activities. Marland Kitchen Rescue medications: May use albuterol rescue inhaler 2 puffs or nebulizer every 4 to 6 hours as needed for shortness of breath, chest tightness,  coughing, and wheezing. Monitor frequency of use.   Perennial allergic rhinitis with a possible nonallergic component Past history - 2019 skin testing positive to mold and dog. Interim history - still having some PND.  Continue environmental control measures.  Continue montelukast 10mg  daily.  Continue fluticasone nasal spray 1-2 spray per nostril once a day for nasal congestion.  May use Azelastine nasal spray, 1-2 sprays per nostril TWICE daily as needed for runny nose.  Nasal saline spray (i.e., Simply Saline) or nasal saline lavage (i.e., NeilMed) is recommended as needed and prior to medicated nasal sprays.  Gastroesophageal reflux disease without esophagitis Taking Pepcid 20mg  1-2 times a day and Nexium once a day. Not really following a reflux diet.  Continue appropriate reflux lifestyle modifications.   Continue pantoprazole 40 mg daily, and famotidine 20 mg twice daily.  Cough, persistent Slight improvement. Taking Gabapentin 300mg  twice a day now instead of three times a day with no worsening symptoms.   Follow up with Dr. Arnette Norris (ENT).  Continue Gabapentin 300mg  two times a day as prescribed by Dr. Arnette Norris.    Return in about 3 months (around 02/11/2020).  Diagnostics: Spirometry:  Tracings reviewed. Her effort: Good reproducible efforts. FVC: 2.03L FEV1: 1.44L, 74% predicted FEV1/FVC ratio: 71% Interpretation: Spirometry consistent with possible restrictive disease.  Please see scanned spirometry results for details.  Medication List:  Current Outpatient Medications  Medication Sig Dispense Refill  . albuterol (VENTOLIN HFA) 108 (90 Base) MCG/ACT inhaler INHALE 2 PUFFS BY MOUTH EVERY 6 HOURS AS NEEDED FOR SHORTNESS OF BREATH  OR WHEEZING. 8 g 1  . azelastine (ASTELIN) 0.1 % nasal spray SPRAY 1 TO 2 SPRAYS PER NOSTRIL TWICE DAILY. 30 mL 3  . budesonide-formoterol (SYMBICORT) 160-4.5 MCG/ACT inhaler INHALE 2 PUFFS INTO THE LUNGS TWICE DAILY. RINSE GARGLE AND SPIT OUT  A FTER USE. 10.2 g 0  . cimetidine (TAGAMET) 800 MG tablet TAKE 1/2 TABLET BY MOUTH ONCE OR TWICE DAILY AS NEEDED 30 tablet 2  . famotidine (PEPCID) 20 MG tablet TAKE (1) OR (2) TABLETS BY MOUTH DAILY. 60 tablet 0  . FLUoxetine (PROZAC) 20 MG capsule Take 1 capsule by mouth daily.    . fluticasone (FLONASE) 50 MCG/ACT nasal spray Place 2 sprays into both nostrils 2 (two) times daily. 16 g 5  . gabapentin (NEURONTIN) 300 MG capsule     . montelukast (SINGULAIR) 10 MG tablet TAKE ONE TABLET BY MOUTH AT BEDTIME. 28 tablet 0  . nitrofurantoin (MACRODANTIN) 100 MG capsule Take 100 mg by mouth at bedtime.    . NON FORMULARY [facility]  Antifungal (nail)-#1    . pantoprazole (PROTONIX) 40 MG tablet TAKE 1 TABLET BY MOUTH ONCE DAILY 30 MINUTES BEFORE BREAKFAST. 28 tablet 0  . Respiratory Therapy Supplies (FLUTTER) DEVI Use as directed 1 each 3  . SF 5000 PLUS 1.1 % CREA dental cream USE AS DIRECTED TO BRUSH TEETH AT BEDTIME    . Spacer/Aero-Holding Chambers (AEROCHAMBER PLUS WITH MASK) inhaler USE AS DIRECTED 1 each 0  . SPIRIVA RESPIMAT 1.25 MCG/ACT AERS INHALE 2 PUFFS BY MOUTH ONCE DAILY 4 g 4   Current Facility-Administered Medications  Medication Dose Route Frequency Provider Last Rate Last Admin  . predniSONE (DELTASONE) tablet 10 mg  10 mg Oral Q breakfast Bobbitt, [place] Muta, MD       Allergies: Allergies  Allergen Reactions  . Sulfa Antibiotics Other (See Comments)    Unknown- pt unsure of reaction; believes it may be nausea   I reviewed her past medical history, social history, family history, and environmental history and no significant changes have been reported from her previous visit.  Review of Systems  Constitutional: Negative for appetite change, chills, fever and unexpected weight change.  HENT: Positive for postnasal drip. Negative for congestion and rhinorrhea.   Eyes: Negative for itching.  Respiratory: Positive for cough. Negative for chest tightness,  shortness of breath and wheezing.   Gastrointestinal: Negative for abdominal pain.  Skin: Negative for rash.  Allergic/Immunologic: Positive for environmental allergies.  Neurological: Negative for headaches.   Objective: BP 108/64 (BP Location: Left Arm, Patient Position: Sitting, Cuff Size: Normal)   Pulse 72   Temp 97.8 F (36.6 C) (Temporal)   Resp 20   SpO2 98%  There is no height or weight on file to calculate BMI. Physical Exam  Constitutional: She is oriented to person, place, and time. She appears well-developed and well-nourished.  HENT:  Head: Normocephalic and atraumatic.  Right Ear: External ear normal.  Left Ear: External ear normal.  Nose: Nose normal.  Mouth/Throat: Oropharynx is clear and moist.  Eyes: Conjunctivae and EOM are normal.  Cardiovascular: Normal rate, regular rhythm and normal heart sounds. Exam reveals no gallop and no friction rub.  No murmur heard. Pulmonary/Chest: Effort normal and breath sounds normal. She has no wheezes. She has no rales.  Musculoskeletal:     Cervical back: Neck supple.  Neurological: She is alert and oriented to person, place, and time.  Skin: Skin is warm. No rash noted.  Psychiatric: She has a  normal mood and affect. Her behavior is normal.  Nursing note and vitals reviewed.  Previous notes and tests were reviewed. The plan was reviewed with the patient/family, and all questions/concerned were addressed.  It was my pleasure to see [name] today and participate in her care. Please feel free to contact me with any questions or concerns.  Sincerely,  Rexene Alberts, DO Allergy & Immunology  [facility] office: [phone] [facility] office: [place] office: [phone]

## 2019-11-12 NOTE — Assessment & Plan Note (Signed)
Still has coughing fits but only using albuterol once a week.  ACT score 20.  Today's spirometry showed some mild restriction. . Daily controller medication(s): continue Symbicort 160 2 puffs twice a day with spacer and rinse mouth afterwards. . Continue Spiriva 1.22mcg 2 puffs daily. . Continue montelukast 10mg  daily.  . Prior to physical activity: May use albuterol rescue inhaler 2 puffs 5 to 15 minutes prior to strenuous physical activities. Marland Kitchen Rescue medications: May use albuterol rescue inhaler 2 puffs or nebulizer every 4 to 6 hours as needed for shortness of breath, chest tightness, coughing, and wheezing. Monitor frequency of use.

## 2019-11-12 NOTE — Patient Instructions (Addendum)
Moderate persistent asthma . Daily controller medication(s): continue Symbicort 160 2 puffs twice a day with spacer and rinse mouth afterwards. . Continue Spiriva 1.37mcg 2 puffs daily. . Continue montelukast 10mg  daily.  . Prior to physical activity: May use albuterol rescue inhaler 2 puffs 5 to 15 minutes prior to strenuous physical activities. Marland Kitchen Rescue medications: May use albuterol rescue inhaler 2 puffs or nebulizer every 4 to 6 hours as needed for shortness of breath, chest tightness, coughing, and wheezing. Monitor frequency of use.  . Asthma control goals:  o Full participation in all desired activities (may need albuterol before activity) o Albuterol use two times or less a week on average (not counting use with activity) o Cough interfering with sleep two times or less a month o Oral steroids no more than once a year o No hospitalizations  Perennial allergic rhinitis with a possible nonallergic component  Continue environmental control measures.  Continue montelukast 10mg  daily.  Continue fluticasone nasal spray 1-2 spray per nostril once a day for nasal congestion.  May use Azelastine nasal spray, 1-2 sprays per nostril TWICE daily as needed for runny nose.  Nasal saline spray (i.e., Simply Saline) or nasal saline lavage (i.e., NeilMed) is recommended as needed and prior to medicated nasal sprays.  GERD (gastroesophageal reflux disease)  Continue appropriate reflux lifestyle modifications.   Continue pantoprazole 40 mg daily, and famotidine 20 mg twice daily.  Cough  Follow up with Dr. Arnette Norris (ENT).  Continue Gabapentin 300mg  two times a day as prescribed by Dr. Arnette Norris.   Follow up in 3 months or sooner if needed.   Our [place] office is located at  [place],  [place]  [address]  Dr. Audelia Acton office: [place], [place], [place] [address]   Heartburn Heartburn is a type of pain or discomfort that can happen in the throat or  chest. It is often described as a burning pain. It may also cause a bad, acid-like taste in the mouth. Heartburn may feel worse when you lie down or bend over. It may be worse at night. It may be caused by stomach contents that move back up (reflux) into the tube that connects the mouth with the stomach (esophagus). Follow these instructions at home: Eating and drinking   Avoid certain foods and drinks as told by your doctor. This may include: ? Coffee and tea (with or without caffeine). ? Drinks that have alcohol. ? Energy drinks and sports drinks. ? Carbonated drinks or sodas. ? Chocolate and cocoa. ? Peppermint and mint flavorings. ? Garlic and onions. ? Horseradish. ? Spicy and acidic foods, such as:  Peppers.  Chili powder and curry powder.  Vinegar.  Hot sauces and BBQ sauce. ? Citrus fruit juices and citrus fruits, such as:  Oranges.  Lemons.  Limes. ? Tomato-based foods, such as:  Red sauce and pizza with red sauce.  Chili.  Salsa. ? Fried and fatty foods, such as:  Donuts.  [place] fries and potato chips.  High-fat dressings. ? High-fat meats, such as:  Hot dogs and sausage.  Rib eye steak.  Ham and bacon. ? High-fat dairy items, such as:  Whole milk.  Butter.  Cream cheese.  Eat small meals often. Avoid eating large meals.  Avoid drinking large amounts of liquid with your meals.  Avoid eating meals during the 2-3 hours before bedtime.  Avoid lying down right after you eat.  Do not exercise right after you eat. Lifestyle  If you are overweight, lose an amount of weight that is healthy for you. Ask your doctor about a safe weight loss goal.  Do not use any products that contain nicotine or tobacco, including cigarettes, e-cigarettes, and chewing tobacco. These can make your symptoms worse. If you need help quitting, ask your doctor.  Wear loose clothes. Do not wear anything tight around your waist.  Raise (elevate) the head of  your bed about 6 inches (15 cm) when you sleep.  Try to lower your stress. If you need help doing this, ask your doctor. General instructions  Pay attention to any changes in your symptoms.  Take over-the-counter and prescription medicines only as told by your doctor. ? Do not take aspirin, ibuprofen, or other NSAIDs unless your doctor says it is okay. ? Stop medicines only as told by your doctor.  Keep all follow-up visits as told by your doctor. This is important. Contact a doctor if:  You have new symptoms.  You lose weight and you do not know why it is happening.  You have trouble swallowing, or it hurts to swallow.  You have wheezing or a cough that keeps happening.  Your symptoms do not get better with treatment.  You have heartburn often for more than 2 weeks. Get help right away if:  You have pain in your arms, neck, jaw, teeth, or back.  You feel sweaty, dizzy, or light-headed.  You have chest pain or shortness of breath.  You throw up (vomit) and your throw up looks like blood or coffee grounds.  Your poop (stool) is bloody or black. These symptoms may represent a serious problem that is an emergency. Do not wait to see if the symptoms will go away. Get medical help right away. Call your local emergency services (911 in the [place] Do not drive yourself to the hospital. Summary  Heartburn is a type of pain that can happen in the throat or chest. It can feel like a burning pain. It may also cause a bad, acid-like taste in the mouth.  You may need to avoid certain foods and drinks to help your symptoms. Ask your doctor what foods and drinks you should avoid.  Take over-the-counter and prescription medicines only as told by your doctor. Do not take aspirin, ibuprofen, or other NSAIDs unless your doctor told you to do so.  Contact your doctor if your symptoms do not get better or they get worse. This information is not intended to replace advice given to you by your  health care provider. Make sure you discuss any questions you have with your health care provider. Document Revised: 12/05/2017 Document Reviewed: 12/05/2017 Elsevier Patient Education  [place].

## 2019-11-12 NOTE — Assessment & Plan Note (Signed)
Slight improvement. Taking Gabapentin 300mg  twice a day now instead of three times a day with no worsening symptoms.   Follow up with Dr. Arnette Norris (ENT).  Continue Gabapentin 300mg  two times a day as prescribed by Dr. Arnette Norris.

## 2019-12-18 ENCOUNTER — Other Ambulatory Visit: Payer: Self-pay

## 2019-12-18 ENCOUNTER — Ambulatory Visit: Payer: Medicare HMO | Admitting: Podiatry

## 2019-12-18 DIAGNOSIS — B351 Tinea unguium: Secondary | ICD-10-CM

## 2019-12-18 DIAGNOSIS — M79674 Pain in right toe(s): Secondary | ICD-10-CM | POA: Diagnosis not present

## 2019-12-18 DIAGNOSIS — M2041 Other hammer toe(s) (acquired), right foot: Secondary | ICD-10-CM

## 2019-12-18 DIAGNOSIS — M79675 Pain in left toe(s): Secondary | ICD-10-CM | POA: Diagnosis not present

## 2019-12-19 NOTE — Progress Notes (Signed)
Subjective: 69 y.o. returns the office today for painful, elongated, thickened toenails which she cannot trim herself as well as her hammertoes.  The toe crest been very helpful and she has no new concerns.  She said that she was to start a follow-up as needed after his appointment she is doing much better. Denies any systemic complaints such as fevers, chills, nausea, vomiting.   PCP: Asencion Noble, MD  Objective: AAO 3, NAD DP/PT pulses palpable, CRT less than 3 seconds Nails hypertrophic, dystrophic, elongated, brittle, discolored 10. There is tenderness overlying the nails 1-5 bilaterally. There is no surrounding erythema or drainage along the nail sites. No open lesions or pre-ulcerative lesions are identified. Hammertoe deformities are evident. No other areas of tenderness bilateral lower extremities. No overlying edema, erythema, increased warmth. Dry skin to the heels without open sores, skin fissures.  No pain with calf compression, swelling, warmth, erythema.  Assessment: Patient presents with symptomatic onychomycosis  Plan: -Treatment options including alternatives, risks, complications were discussed -Nails sharply debrided 10 without complication/bleeding. Continue topical antifungal.  -Mild hammertoes are present.  Continue toe crest which were dispensed today.  Discussed exercises for her toes. -Discussed daily foot inspection. If there are any changes, to call the office immediately.  -Follow-up in 3 months or sooner if any problems are to arise. In the meantime, encouraged to call the office with any questions, concerns, changes symptoms.  Celesta Gentile, DPM

## 2019-12-24 ENCOUNTER — Other Ambulatory Visit: Payer: Self-pay | Admitting: Allergy and Immunology

## 2020-01-11 ENCOUNTER — Ambulatory Visit: Payer: Medicare HMO | Admitting: Allergy

## 2020-01-11 ENCOUNTER — Other Ambulatory Visit: Payer: Self-pay

## 2020-01-11 ENCOUNTER — Encounter: Payer: Self-pay | Admitting: Allergy

## 2020-01-11 VITALS — BP 120/70 | HR 68 | Temp 98.1°F | Resp 16

## 2020-01-11 DIAGNOSIS — R05 Cough: Secondary | ICD-10-CM

## 2020-01-11 DIAGNOSIS — J4541 Moderate persistent asthma with (acute) exacerbation: Secondary | ICD-10-CM

## 2020-01-11 DIAGNOSIS — J3089 Other allergic rhinitis: Secondary | ICD-10-CM

## 2020-01-11 DIAGNOSIS — R053 Chronic cough: Secondary | ICD-10-CM

## 2020-01-11 DIAGNOSIS — K219 Gastro-esophageal reflux disease without esophagitis: Secondary | ICD-10-CM

## 2020-01-11 MED ORDER — FAMOTIDINE 20 MG PO TABS: 20.0000 mg | ORAL_TABLET | Freq: Two times a day (BID) | ORAL | 3 refills | Status: DC

## 2020-01-11 MED ORDER — OMEPRAZOLE 20 MG PO CPDR: 20.0000 mg | DELAYED_RELEASE_CAPSULE | Freq: Every day | ORAL | 3 refills | Status: DC

## 2020-01-11 NOTE — Progress Notes (Signed)
Follow-up Note  RE: [name] MRN: [id number] DOB: 08-24-1950 Date of Office Visit: 01/11/2020   History of present illness: [name] is a 69 y.o. female presenting today for follow-up of asthma allergic rhinitis and reflux.  She was last seen in the office on 11/12/2019 by Dr. Maudie Mercury.  She also follows with Dr. Benjamine Mola in ENT for cough and has been taking gabapentin for this.  She states she would be making follow-up with Dr. Benjamine Mola.   She states she is using her albuterol inhaler more and reports 2-3 times a day use for the past 2 weeks.  She reports symptoms of coughing bouts to the point she feels like she could throw up.  She is reporting twice a week nighttime awakenings as well.  Denies wheezing, shortness of breath or chest tightness.  She is not sure what the trigger of this current flareup is.  She is taking her symbicort 2 puffs twice a day, spiriva 2 puffs once a day and singulair daily.   She also has acid reflux.  She is taking pantoprazole and famotidine. She does not feel that this regimen is helping to control her reflux symptoms.  She is wondering if she can change pantoprazole to omeprazole to see if this works better.  She continues to take her Singulair as above and he does have access to Flonase and azelastine for control of nasal congestion and drainage.  She is fully vaccinated with Covid vaccine (Moderna).    Review of systems: Review of Systems  Constitutional: Negative.   HENT: Negative.   Eyes: Negative.   Respiratory: Positive for cough. Negative for sputum production, shortness of breath and wheezing.   Cardiovascular: Negative.   Gastrointestinal: Positive for heartburn.  Musculoskeletal: Negative.   Skin: Negative.   Neurological: Negative.     All other systems negative unless noted above in HPI  Past medical/social/surgical/family history have been reviewed and are unchanged unless specifically indicated below.  No changes  Medication  List: Current Outpatient Medications  Medication Sig Dispense Refill  . albuterol (VENTOLIN HFA) 108 (90 Base) MCG/ACT inhaler INHALE 2 PUFFS BY MOUTH EVERY 6 HOURS AS NEEDED FOR SHORTNESS OF BREATH OR WHEEZING. 8 g 1  . azelastine (ASTELIN) 0.1 % nasal spray SPRAY 1 TO 2 SPRAYS PER NOSTRIL TWICE DAILY. 30 mL 3  . budesonide-formoterol (SYMBICORT) 160-4.5 MCG/ACT inhaler INHALE 2 PUFFS INTO THE LUNGS TWICE DAILY. RINSE GARGLE AND SPIT OUT A FTER USE. 10.2 g 3  . cimetidine (TAGAMET) 800 MG tablet TAKE 1/2 TABLET BY MOUTH ONCE OR TWICE DAILY AS NEEDED 30 tablet 2  . famotidine (PEPCID) 20 MG tablet Take 1 tablet (20 mg total) by mouth 2 (two) times daily. 60 tablet 3  . FLUoxetine (PROZAC) 20 MG capsule Take 1 capsule by mouth daily.    . fluticasone (FLONASE) 50 MCG/ACT nasal spray Place 2 sprays into both nostrils 2 (two) times daily. 16 g 5  . gabapentin (NEURONTIN) 300 MG capsule     . montelukast (SINGULAIR) 10 MG tablet TAKE ONE TABLET BY MOUTH AT BEDTIME. 28 tablet 3  . nitrofurantoin (MACRODANTIN) 100 MG capsule Take 100 mg by mouth at bedtime.    . NON FORMULARY [facility]  Antifungal (nail)-#1    . Respiratory Therapy Supplies (FLUTTER) DEVI Use as directed 1 each 3  . SF 5000 PLUS 1.1 % CREA dental cream USE AS DIRECTED TO BRUSH TEETH AT BEDTIME    . Spacer/Aero-Holding Chambers (AEROCHAMBER  PLUS WITH MASK) inhaler USE AS DIRECTED 1 each 0  . SPIRIVA RESPIMAT 1.25 MCG/ACT AERS INHALE 2 PUFFS BY MOUTH ONCE DAILY 4 g 4  . omeprazole (PRILOSEC) 20 MG capsule Take 1 capsule (20 mg total) by mouth daily. 30 capsule 3   Current Facility-Administered Medications  Medication Dose Route Frequency Provider Last Rate Last Admin  . predniSONE (DELTASONE) tablet 10 mg  10 mg Oral Q breakfast Bobbitt, [place] Muta, MD         Known medication allergies: Allergies  Allergen Reactions  . Sulfa Antibiotics Other (See Comments)    Unknown- pt unsure of reaction; believes it may be  nausea     Physical examination: Blood pressure 120/70, pulse 68, temperature 98.1 F (36.7 C), resp. rate 16, SpO2 96 %.  General: Alert, interactive, in no acute distress. HEENT: PERRLA, TMs pearly gray, turbinates minimally edematous without discharge, post-pharynx non erythematous. Neck: Supple without lymphadenopathy. Lungs: Clear to auscultation without wheezing, rhonchi or rales. {no increased work of breathing. CV: Normal S1, S2 without murmurs. Abdomen: Nondistended, nontender. Skin: Warm and dry, without lesions or rashes. Extremities:  No clubbing, cyanosis or edema. Neuro:   Grossly intact.  Diagnositics/Labs:  Spirometry: FEV1: 1.46L 75%, FVC: 2.01L 78%, ratio consistent with normal for age/demographics  Assessment and plan:   Moderate persistent asthma with exacerbation . Daily controller medication(s): continue Symbicort 160 2 puffs twice a day with spacer and rinse mouth afterwards. . Continue Spiriva 1.44mcg 2 puffs daily. . Continue montelukast 10mg  daily.  . Prior to physical activity: May use albuterol rescue inhaler 2 puffs 5 to 15 minutes prior to strenuous physical activities. Marland Kitchen Rescue medications: May use albuterol rescue inhaler 2 puffs or nebulizer every 4 to 6 hours as needed for shortness of breath, chest tightness, coughing, and wheezing. Monitor frequency of use.  . For current asthma flare take Prednisone 20mg  (2 tabs) for 4 days then 10mg  (1 tab) on the 5th day and stop . Asthma control goals:  o Full participation in all desired activities (may need albuterol before activity) o Albuterol use two times or less a week on average (not counting use with activity) o Cough interfering with sleep two times or less a month o Oral steroids no more than once a year o No hospitalizations  Perennial allergic rhinitis with a possible nonallergic component  Continue environmental control measures.  Continue montelukast 10mg  daily.  Continue fluticasone  nasal spray 1-2 spray per nostril once a day for nasal congestion.  May use Azelastine nasal spray, 1-2 sprays per nostril TWICE daily as needed for runny nose.  Nasal saline spray (i.e., Simply Saline) or nasal saline lavage (i.e., NeilMed) is recommended as needed and prior to medicated nasal sprays.  GERD (gastroesophageal reflux disease)  Continue appropriate reflux lifestyle modifications.   Stop Pantoprazole  Start Omeprazole 20 mg daily  Continue Famotidine 20 mg twice daily.  Cough  Follow up with Dr. Benjamine Mola (ENT).  Continue Gabapentin 300mg  two times a day as prescribed by Dr. Benjamine Mola.   Follow up in 3 months or sooner if needed.   I appreciate the opportunity to take part in [name]'s care. Please do not hesitate to contact me with questions.  Sincerely,   Prudy Feeler, MD Allergy/Immunology [facility]

## 2020-01-11 NOTE — Patient Instructions (Addendum)
Moderate persistent asthma . Daily controller medication(s): continue Symbicort 160 2 puffs twice a day with spacer and rinse mouth afterwards. . Continue Spiriva 1.61mcg 2 puffs daily. . Continue montelukast 10mg  daily.  . Prior to physical activity: May use albuterol rescue inhaler 2 puffs 5 to 15 minutes prior to strenuous physical activities. Marland Kitchen Rescue medications: May use albuterol rescue inhaler 2 puffs or nebulizer every 4 to 6 hours as needed for shortness of breath, chest tightness, coughing, and wheezing. Monitor frequency of use.  . For current asthma flare take Prednisone 20mg  (2 tabs) for 4 days then 10mg  (1 tab) on the 5th day and stop . Asthma control goals:  o Full participation in all desired activities (may need albuterol before activity) o Albuterol use two times or less a week on average (not counting use with activity) o Cough interfering with sleep two times or less a month o Oral steroids no more than once a year o No hospitalizations  Perennial allergic rhinitis with a possible nonallergic component  Continue environmental control measures.  Continue montelukast 10mg  daily.  Continue fluticasone nasal spray 1-2 spray per nostril once a day for nasal congestion.  May use Azelastine nasal spray, 1-2 sprays per nostril TWICE daily as needed for runny nose.  Nasal saline spray (i.e., Simply Saline) or nasal saline lavage (i.e., NeilMed) is recommended as needed and prior to medicated nasal sprays.  GERD (gastroesophageal reflux disease)  Continue appropriate reflux lifestyle modifications.   Stop Pantoprazole  Start Omeprazole 20 mg daily  Continue Famotidine 20 mg twice daily.  Cough  Follow up with Dr. Benjamine Mola (ENT).  Continue Gabapentin 300mg  two times a day as prescribed by Dr. Benjamine Mola.   Follow up in 3 months or sooner if needed.    Heartburn Heartburn is a type of pain or discomfort that can happen in the throat or chest. It is often described as a  burning pain. It may also cause a bad, acid-like taste in the mouth. Heartburn may feel worse when you lie down or bend over. It may be worse at night. It may be caused by stomach contents that move back up (reflux) into the tube that connects the mouth with the stomach (esophagus). Follow these instructions at home: Eating and drinking   Avoid certain foods and drinks as told by your doctor. This may include: ? Coffee and tea (with or without caffeine). ? Drinks that have alcohol. ? Energy drinks and sports drinks. ? Carbonated drinks or sodas. ? Chocolate and cocoa. ? Peppermint and mint flavorings. ? Garlic and onions. ? Horseradish. ? Spicy and acidic foods, such as:  Peppers.  Chili powder and curry powder.  Vinegar.  Hot sauces and BBQ sauce. ? Citrus fruit juices and citrus fruits, such as:  Oranges.  Lemons.  Limes. ? Tomato-based foods, such as:  Red sauce and pizza with red sauce.  Chili.  Salsa. ? Fried and fatty foods, such as:  Donuts.  [place] fries and potato chips.  High-fat dressings. ? High-fat meats, such as:  Hot dogs and sausage.  Rib eye steak.  Ham and bacon. ? High-fat dairy items, such as:  Whole milk.  Butter.  Cream cheese.  Eat small meals often. Avoid eating large meals.  Avoid drinking large amounts of liquid with your meals.  Avoid eating meals during the 2-3 hours before bedtime.  Avoid lying down right after you eat.  Do not exercise right after you eat. Lifestyle  If you are overweight, lose an amount of weight that is healthy for you. Ask your doctor about a safe weight loss goal.  Do not use any products that contain nicotine or tobacco, including cigarettes, e-cigarettes, and chewing tobacco. These can make your symptoms worse. If you need help quitting, ask your doctor.  Wear loose clothes. Do not wear anything tight around your waist.  Raise (elevate) the head of your bed about 6 inches (15 cm)  when you sleep.  Try to lower your stress. If you need help doing this, ask your doctor. General instructions  Pay attention to any changes in your symptoms.  Take over-the-counter and prescription medicines only as told by your doctor. ? Do not take aspirin, ibuprofen, or other NSAIDs unless your doctor says it is okay. ? Stop medicines only as told by your doctor.  Keep all follow-up visits as told by your doctor. This is important. Contact a doctor if:  You have new symptoms.  You lose weight and you do not know why it is happening.  You have trouble swallowing, or it hurts to swallow.  You have wheezing or a cough that keeps happening.  Your symptoms do not get better with treatment.  You have heartburn often for more than 2 weeks. Get help right away if:  You have pain in your arms, neck, jaw, teeth, or back.  You feel sweaty, dizzy, or light-headed.  You have chest pain or shortness of breath.  You throw up (vomit) and your throw up looks like blood or coffee grounds.  Your poop (stool) is bloody or black. These symptoms may represent a serious problem that is an emergency. Do not wait to see if the symptoms will go away. Get medical help right away. Call your local emergency services (911 in the [place] Do not drive yourself to the hospital. Summary  Heartburn is a type of pain that can happen in the throat or chest. It can feel like a burning pain. It may also cause a bad, acid-like taste in the mouth.  You may need to avoid certain foods and drinks to help your symptoms. Ask your doctor what foods and drinks you should avoid.  Take over-the-counter and prescription medicines only as told by your doctor. Do not take aspirin, ibuprofen, or other NSAIDs unless your doctor told you to do so.  Contact your doctor if your symptoms do not get better or they get worse. This information is not intended to replace advice given to you by your health care provider. Make sure  you discuss any questions you have with your health care provider. Document Revised: 12/05/2017 Document Reviewed: 12/05/2017 Elsevier Patient Education  [place].

## 2020-01-16 DIAGNOSIS — K219 Gastro-esophageal reflux disease without esophagitis: Secondary | ICD-10-CM | POA: Diagnosis not present

## 2020-01-16 DIAGNOSIS — R05 Cough: Secondary | ICD-10-CM | POA: Diagnosis not present

## 2020-01-26 DIAGNOSIS — R0781 Pleurodynia: Secondary | ICD-10-CM | POA: Diagnosis not present

## 2020-01-26 DIAGNOSIS — M549 Dorsalgia, unspecified: Secondary | ICD-10-CM | POA: Diagnosis not present

## 2020-02-05 ENCOUNTER — Other Ambulatory Visit (HOSPITAL_COMMUNITY): Payer: Self-pay | Admitting: Internal Medicine

## 2020-02-05 DIAGNOSIS — Z1231 Encounter for screening mammogram for malignant neoplasm of breast: Secondary | ICD-10-CM

## 2020-02-11 ENCOUNTER — Ambulatory Visit: Payer: Medicare HMO | Admitting: Family Medicine

## 2020-02-14 ENCOUNTER — Ambulatory Visit (HOSPITAL_COMMUNITY)
Admission: RE | Admit: 2020-02-14 | Discharge: 2020-02-14 | Disposition: A | Discharge status: Home / Self Care | Payer: Medicare HMO | Source: Ambulatory Visit | Attending: Internal Medicine | Admitting: Internal Medicine

## 2020-02-14 ENCOUNTER — Other Ambulatory Visit: Payer: Self-pay

## 2020-02-14 DIAGNOSIS — Z1231 Encounter for screening mammogram for malignant neoplasm of breast: Secondary | ICD-10-CM | POA: Diagnosis not present

## 2020-02-20 ENCOUNTER — Telehealth: Payer: Self-pay | Admitting: Allergy

## 2020-02-20 MED ORDER — AEROCHAMBER PLUS FLO-VU MISC: 1.0000 | 1 refills | Status: AC

## 2020-02-20 NOTE — Telephone Encounter (Signed)
Patient needs a new aero chamber sent in to [facility] in [place]. Patient states that insurance should cover it.  Please advise.

## 2020-02-20 NOTE — Telephone Encounter (Signed)
Patient informed that the spacer prescription has been sent in.

## 2020-02-25 ENCOUNTER — Other Ambulatory Visit: Payer: Self-pay | Admitting: Allergy and Immunology

## 2020-03-07 ENCOUNTER — Other Ambulatory Visit: Payer: Self-pay | Admitting: Allergy and Immunology

## 2020-03-11 DIAGNOSIS — L308 Other specified dermatitis: Secondary | ICD-10-CM | POA: Diagnosis not present

## 2020-04-17 ENCOUNTER — Other Ambulatory Visit: Payer: Self-pay

## 2020-04-17 ENCOUNTER — Encounter: Payer: Self-pay | Admitting: Allergy

## 2020-04-17 ENCOUNTER — Ambulatory Visit (INDEPENDENT_AMBULATORY_CARE_PROVIDER_SITE_OTHER): Payer: Medicare HMO | Admitting: Allergy

## 2020-04-17 VITALS — BP 126/82 | HR 69 | Temp 98.4°F | Resp 16 | Ht 62.0 in | Wt 166.8 lb

## 2020-04-17 DIAGNOSIS — K219 Gastro-esophageal reflux disease without esophagitis: Secondary | ICD-10-CM | POA: Diagnosis not present

## 2020-04-17 DIAGNOSIS — J3089 Other allergic rhinitis: Secondary | ICD-10-CM

## 2020-04-17 DIAGNOSIS — J4540 Moderate persistent asthma, uncomplicated: Secondary | ICD-10-CM

## 2020-04-17 DIAGNOSIS — R053 Chronic cough: Secondary | ICD-10-CM

## 2020-04-17 DIAGNOSIS — R05 Cough: Secondary | ICD-10-CM | POA: Diagnosis not present

## 2020-04-17 NOTE — Patient Instructions (Addendum)
Moderate persistent asthma . Daily controller medication(s): continue Symbicort 160 2 puffs twice a day with spacer and rinse mouth afterwards. . Continue Spiriva 1.72mcg 2 puffs daily. . Continue montelukast 10mg  daily.  . Prior to physical activity: May use albuterol rescue inhaler 2 puffs 5 to 15 minutes prior to strenuous physical activities. Marland Kitchen Rescue medications: May use albuterol rescue inhaler 2 puffs or nebulizer every 4 to 6 hours as needed for shortness of breath, chest tightness, coughing, and wheezing. Monitor frequency of use.  . Asthma control goals:  o Full participation in all desired activities (may need albuterol before activity) o Albuterol use two times or less a week on average (not counting use with activity) o Cough interfering with sleep two times or less a month o Oral steroids no more than once a year o No hospitalizations  Perennial allergic rhinitis with a possible nonallergic component  Continue environmental control measures.  Continue montelukast 10mg  daily.  Continue fluticasone nasal spray 1-2 spray per nostril once a day for nasal congestion.  May use Azelastine nasal spray, 1-2 sprays per nostril TWICE daily as needed for runny nose.  Nasal saline spray (i.e., Simply Saline) or nasal saline lavage (i.e., NeilMed) is recommended as needed and prior to medicated nasal sprays.  GERD (gastroesophageal reflux disease)  Continue appropriate reflux lifestyle modifications.   Stop Omeprazole as not effective  Try Dexilant 60mg  1 tab daily.  This is a prescription-based proton pump inhibitor (PPI) for reflux control.  We may be able to get this approved for you as you have tried and not had any benefits with use of Pantoprazole or Omeprazole.    Continue Famotidine 20 mg twice daily.  Cut out caffeine.  Change to decaf coffee  Cough  Follow up with Dr. Benjamine Mola (ENT).  Continue Gabapentin 300mg  two times a day as prescribed by Dr. Benjamine Mola.   Follow up  in 3 months or sooner if needed.    Heartburn Heartburn is a type of pain or discomfort that can happen in the throat or chest. It is often described as a burning pain. It may also cause a bad, acid-like taste in the mouth. Heartburn may feel worse when you lie down or bend over. It may be worse at night. It may be caused by stomach contents that move back up (reflux) into the tube that connects the mouth with the stomach (esophagus). Follow these instructions at home: Eating and drinking   Avoid certain foods and drinks as told by your doctor. This may include: ? Coffee and tea (with or without caffeine). ? Drinks that have alcohol. ? Energy drinks and sports drinks. ? Carbonated drinks or sodas. ? Chocolate and cocoa. ? Peppermint and mint flavorings. ? Garlic and onions. ? Horseradish. ? Spicy and acidic foods, such as:  Peppers.  Chili powder and curry powder.  Vinegar.  Hot sauces and BBQ sauce. ? Citrus fruit juices and citrus fruits, such as:  Oranges.  Lemons.  Limes. ? Tomato-based foods, such as:  Red sauce and pizza with red sauce.  Chili.  Salsa. ? Fried and fatty foods, such as:  Donuts.  [place] fries and potato chips.  High-fat dressings. ? High-fat meats, such as:  Hot dogs and sausage.  Rib eye steak.  Ham and bacon. ? High-fat dairy items, such as:  Whole milk.  Butter.  Cream cheese.  Eat small meals often. Avoid eating large meals.  Avoid drinking large amounts of liquid with your meals.  Avoid  eating meals during the 2-3 hours before bedtime.  Avoid lying down right after you eat.  Do not exercise right after you eat. Lifestyle      If you are overweight, lose an amount of weight that is healthy for you. Ask your doctor about a safe weight loss goal.  Do not use any products that contain nicotine or tobacco, including cigarettes, e-cigarettes, and chewing tobacco. These can make your symptoms worse. If you need help  quitting, ask your doctor.  Wear loose clothes. Do not wear anything tight around your waist.  Raise (elevate) the head of your bed about 6 inches (15 cm) when you sleep.  Try to lower your stress. If you need help doing this, ask your doctor. General instructions  Pay attention to any changes in your symptoms.  Take over-the-counter and prescription medicines only as told by your doctor. ? Do not take aspirin, ibuprofen, or other NSAIDs unless your doctor says it is okay. ? Stop medicines only as told by your doctor.  Keep all follow-up visits as told by your doctor. This is important. Contact a doctor if:  You have new symptoms.  You lose weight and you do not know why it is happening.  You have trouble swallowing, or it hurts to swallow.  You have wheezing or a cough that keeps happening.  Your symptoms do not get better with treatment.  You have heartburn often for more than 2 weeks. Get help right away if:  You have pain in your arms, neck, jaw, teeth, or back.  You feel sweaty, dizzy, or light-headed.  You have chest pain or shortness of breath.  You throw up (vomit) and your throw up looks like blood or coffee grounds.  Your poop (stool) is bloody or black. These symptoms may represent a serious problem that is an emergency. Do not wait to see if the symptoms will go away. Get medical help right away. Call your local emergency services (911 in the [place] Do not drive yourself to the hospital. Summary  Heartburn is a type of pain that can happen in the throat or chest. It can feel like a burning pain. It may also cause a bad, acid-like taste in the mouth.  You may need to avoid certain foods and drinks to help your symptoms. Ask your doctor what foods and drinks you should avoid.  Take over-the-counter and prescription medicines only as told by your doctor. Do not take aspirin, ibuprofen, or other NSAIDs unless your doctor told you to do so.  Contact your doctor  if your symptoms do not get better or they get worse. This information is not intended to replace advice given to you by your health care provider. Make sure you discuss any questions you have with your health care provider. Document Revised: 12/05/2017 Document Reviewed: 12/05/2017 Elsevier Patient Education  [place].

## 2020-04-17 NOTE — Progress Notes (Signed)
Follow-up Note  RE: [name] MRN: [id number] DOB: 15-Jul-1951 Date of Office Visit: 04/17/2020   History of present illness: [name] is a 69 y.o. female presenting today for follow-up of asthma, mixed rhinitis, GERD with cough. She was last seen in the office on 01/11/20 by myself.    She states her reflux is still not controlled.  She has not reduced her caffeine intake thus when she has coffee she does have a coughing fits.  She states she knows she needs to stop the caffeine intake and states she will try her best to quit.  She is taking omeprazole.  Had her stop pantoprazole at last visit as was not effective.  However omeprazole also is not effective.   She does continue to use Symbicort 2 puffs twice a day as well as Spiriva 2 puffs daily and singular daily.  She reports in the month of September she needed to use her rescue inhaler about 5-6 times.  She denies any ED or urgent care visits or any systemic steroid needs. She also would like refill of triamcinolone for her skin.  Reports it is dry and itchy.  She denies any significant nasal drainage or congestion but does have access to both Flonase and azelastine nasal sprays. She does continue to take gabapentin 300 mg 2 times a day as directed by Dr. Benjamine Mola her ENT.  Review of systems: Review of Systems  Constitutional: Negative.   HENT: Negative.   Eyes: Negative.   Cardiovascular: Negative.   Gastrointestinal: Positive for heartburn.  Musculoskeletal: Negative.   Skin:       See HPI  Neurological: Negative.     All other systems negative unless noted above in HPI  Past medical/social/surgical/family history have been reviewed and are unchanged unless specifically indicated below.  No changes  Medication List: Current Outpatient Medications  Medication Sig Dispense Refill   albuterol (VENTOLIN HFA) 108 (90 Base) MCG/ACT inhaler INHALE 2 PUFFS BY MOUTH EVERY 6 HOURS AS NEEDED FOR SHORTNESS OF BREATH OR WHEEZING.  8 g 1   azelastine (ASTELIN) 0.1 % nasal spray SPRAY 1 TO 2 SPRAYS PER NOSTRIL TWICE DAILY. 30 mL 3   budesonide-formoterol (SYMBICORT) 160-4.5 MCG/ACT inhaler INHALE 2 PUFFS INTO THE LUNGS TWICE DAILY. RINSE GARGLE AND SPIT OUT A FTER USE. 10.2 g 3   cimetidine (TAGAMET) 800 MG tablet TAKE 1/2 TABLET BY MOUTH ONCE OR TWICE DAILY AS NEEDED 30 tablet 2   FLUoxetine (PROZAC) 20 MG capsule Take 1 capsule by mouth daily.     fluticasone (FLONASE) 50 MCG/ACT nasal spray Place 2 sprays into both nostrils 2 (two) times daily. 16 g 5   gabapentin (NEURONTIN) 300 MG capsule      montelukast (SINGULAIR) 10 MG tablet TAKE ONE TABLET BY MOUTH AT BEDTIME. 28 tablet 4   nitrofurantoin (MACRODANTIN) 100 MG capsule Take 100 mg by mouth at bedtime.     NON FORMULARY [facility]  Antifungal (nail)-#1     omeprazole (PRILOSEC) 20 MG capsule Take 1 capsule (20 mg total) by mouth daily. 30 capsule 3   Respiratory Therapy Supplies (FLUTTER) DEVI Use as directed 1 each 3   SF 5000 PLUS 1.1 % CREA dental cream USE AS DIRECTED TO BRUSH TEETH AT BEDTIME     Spacer/Aero-Holding Chambers (AEROCHAMBER PLUS WITH MASK) inhaler 1 each by Other route See admin instructions. Use with inhaler as instructed. 1 each 1   SPIRIVA RESPIMAT 1.25 MCG/ACT AERS INHALE 2 PUFFS  BY MOUTH ONCE DAILY 4 g 5   triamcinolone cream (KENALOG) 0.1 %      famotidine (PEPCID) 20 MG tablet Take 1 tablet (20 mg total) by mouth 2 (two) times daily. (Patient not taking: Reported on 04/17/2020) 60 tablet 3   Current Facility-Administered Medications  Medication Dose Route Frequency Provider Last Rate Last Admin   predniSONE (DELTASONE) tablet 10 mg  10 mg Oral Q breakfast Bobbitt, Sedalia Muta, MD         Known medication allergies: Allergies  Allergen Reactions   Sulfa Antibiotics Other (See Comments)    Unknown- pt unsure of reaction; believes it may be nausea     Physical examination: Blood pressure 126/82, pulse  69, temperature 98.4 F (36.9 C), resp. rate 16, height 5\' 2"  (1.575 m), weight 166 lb 12.8 oz (75.7 kg), SpO2 99 %.  General: Alert, interactive, in no acute distress. HEENT: PERRLA, TMs pearly gray, turbinates minimally edematous without discharge, post-pharynx non erythematous. Neck: Supple without lymphadenopathy. Lungs: Clear to auscultation without wheezing, rhonchi or rales. {no increased work of breathing. CV: Normal S1, S2 without murmurs. Abdomen: Nondistended, nontender. Skin: Warm and dry, without lesions or rashes. Extremities:  No clubbing, cyanosis or edema. Neuro:   Grossly intact.  Diagnositics/Labs: None today  Assessment and plan:   Moderate persistent asthma  Daily controller medication(s): continue Symbicort 160 2 puffs twice a day with spacer and rinse mouth afterwards.  Continue Spiriva 1.1mcg 2 puffs daily.  Continue montelukast 10mg  daily.   Prior to physical activity: May use albuterol rescue inhaler 2 puffs 5 to 15 minutes prior to strenuous physical activities.  Rescue medications: May use albuterol rescue inhaler 2 puffs or nebulizer every 4 to 6 hours as needed for shortness of breath, chest tightness, coughing, and wheezing. Monitor frequency of use.   Asthma control goals:  o Full participation in all desired activities (may need albuterol before activity) o Albuterol use two times or less a week on average (not counting use with activity) o Cough interfering with sleep two times or less a month o Oral steroids no more than once a year o No hospitalizations  Perennial allergic rhinitis with a possible nonallergic component  Continue environmental control measures.  Continue montelukast 10mg  daily.  Continue fluticasone nasal spray 1-2 spray per nostril once a day for nasal congestion.  May use Azelastine nasal spray, 1-2 sprays per nostril TWICE daily as needed for runny nose.  Nasal saline spray (i.e., Simply Saline) or nasal saline  lavage (i.e., NeilMed) is recommended as needed and prior to medicated nasal sprays.  GERD (gastroesophageal reflux disease)  Continue appropriate reflux lifestyle modifications.   Stop Omeprazole as not effective  Try Dexilant 60mg  1 tab daily.  This is a prescription-based proton pump inhibitor (PPI) for reflux control.  We may be able to get this approved for you as you have tried and not had any benefits with use of Pantoprazole or Omeprazole.    Continue Famotidine 20 mg twice daily.  Cut out caffeine.  Change to decaf coffee  Cough  Follow up with Dr. Benjamine Mola (ENT).  Continue Gabapentin 300mg  two times a day as prescribed by Dr. Benjamine Mola.   Follow up in 3 months or sooner if needed.   I appreciate the opportunity to take part in Rahmah's care. Please do not hesitate to contact me with questions.  Sincerely,   Prudy Feeler, MD Allergy/Immunology [facility]

## 2020-04-25 ENCOUNTER — Other Ambulatory Visit: Payer: Self-pay | Admitting: Allergy and Immunology

## 2020-05-06 ENCOUNTER — Telehealth: Payer: Self-pay | Admitting: Allergy

## 2020-05-06 NOTE — Telephone Encounter (Signed)
Can someone look into this?

## 2020-05-06 NOTE — Telephone Encounter (Signed)
Patient called to see if dr Nelva Bush had found out if [facility] would cover the dexilant 60mg . For reflux control . [place] in [place]. [phone].

## 2020-05-06 NOTE — Telephone Encounter (Signed)
According to formulary dexilant 60mg  is covered at 30 tablets for 30 days on a tier 3

## 2020-05-06 NOTE — Telephone Encounter (Signed)
Oh great.  Please send in prescription if she needs refill sent

## 2020-05-07 ENCOUNTER — Telehealth: Payer: Self-pay

## 2020-05-07 ENCOUNTER — Other Ambulatory Visit: Payer: Self-pay

## 2020-05-07 MED ORDER — DEXILANT 60 MG PO CPDR: 60.0000 mg | DELAYED_RELEASE_CAPSULE | Freq: Every day | ORAL | 5 refills | Status: DC

## 2020-05-07 NOTE — Telephone Encounter (Signed)
Medication has been sent.  

## 2020-05-14 ENCOUNTER — Telehealth: Payer: Self-pay | Admitting: Allergy

## 2020-05-14 NOTE — Telephone Encounter (Signed)
[facility] called about a rx for pepcid for this patient. [phone]

## 2020-05-14 NOTE — Telephone Encounter (Signed)
Spoke with adam and approved pepcid refills with 4 refills on hand

## 2020-06-09 ENCOUNTER — Telehealth: Payer: Self-pay | Admitting: *Deleted

## 2020-06-09 NOTE — Telephone Encounter (Signed)
Patient called office and wanted to know if Dexilant could be sent in as generic due to cost being $45.41 every month.  Called and spoke to [place] at [facility] and they will automatically run RX thru as generic once it is available.  No coupons are available at this time for patient to use since she has Medicare part D.  Left message for patient regarding call to pharmacy and to call me back if she has any questions.

## 2020-06-17 ENCOUNTER — Other Ambulatory Visit: Payer: Self-pay | Admitting: Allergy and Immunology

## 2020-07-14 DIAGNOSIS — R053 Chronic cough: Secondary | ICD-10-CM | POA: Diagnosis not present

## 2020-07-14 DIAGNOSIS — K219 Gastro-esophageal reflux disease without esophagitis: Secondary | ICD-10-CM | POA: Diagnosis not present

## 2020-07-16 IMAGING — CR PORTABLE CHEST - 1 VIEW
1 series · 1 of 1 positions shown · non-contrast
Comparison: November 29, 2017

CLINICAL DATA: Fever

EXAM:
PORTABLE CHEST 1 VIEW

[portable]
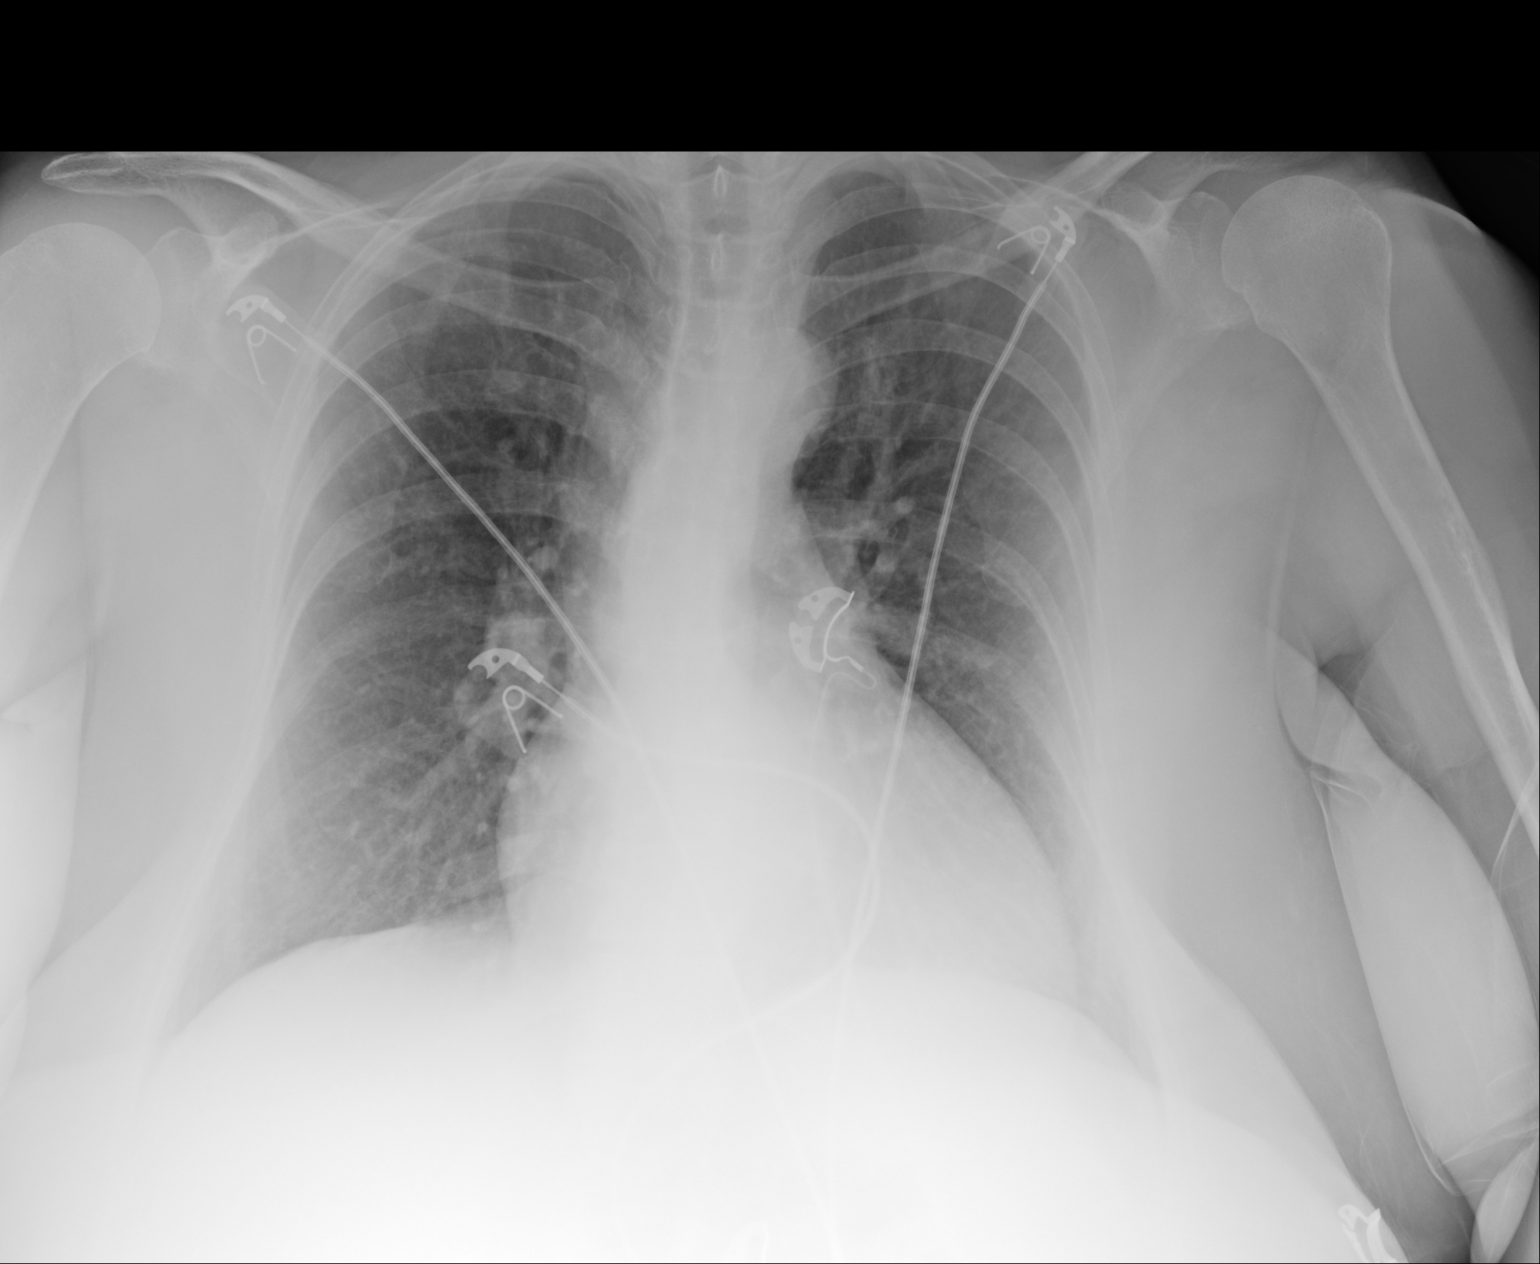

[1 of 1 positions shown; findings below may reference images not displayed]

FINDINGS: The heart size and mediastinal contours are within normal limits.
Both lungs are clear. The visualized skeletal structures are
unremarkable.
IMPRESSION: No active disease.

## 2020-07-23 ENCOUNTER — Ambulatory Visit: Payer: Medicare HMO | Admitting: Allergy

## 2020-07-23 ENCOUNTER — Encounter: Payer: Self-pay | Admitting: Allergy

## 2020-07-23 ENCOUNTER — Other Ambulatory Visit: Payer: Self-pay

## 2020-07-23 VITALS — BP 112/66 | HR 69 | Temp 98.2°F | Resp 16

## 2020-07-23 DIAGNOSIS — K219 Gastro-esophageal reflux disease without esophagitis: Secondary | ICD-10-CM

## 2020-07-23 DIAGNOSIS — J4540 Moderate persistent asthma, uncomplicated: Secondary | ICD-10-CM | POA: Diagnosis not present

## 2020-07-23 DIAGNOSIS — R053 Chronic cough: Secondary | ICD-10-CM

## 2020-07-23 DIAGNOSIS — J3089 Other allergic rhinitis: Secondary | ICD-10-CM

## 2020-07-23 MED ORDER — BREZTRI AEROSPHERE 160-9-4.8 MCG/ACT IN AERO: 2.0000 | INHALATION_SPRAY | Freq: Two times a day (BID) | RESPIRATORY_TRACT | 5 refills | Status: DC

## 2020-07-23 NOTE — Progress Notes (Signed)
Follow-up Note  RE: [name] MRN: [id number] DOB: 1951-03-12 Date of Office Visit: 07/23/2020   History of present illness: [name] is a 70 y.o. female presenting today for follow-up of asthma with persistent cough, allergic rhinitis, GERD.  She was last seen in the office on 04/17/2020 by myself.  She states she is still coughing.  She did see Dr. Suszanne Conners in follow-up who she states recommended she continue gabapentin 3 times a day at this time.  She is reporting a lot of mucus with her cough.  She continues to use Symbicort 2 puffs twice a day and Spiriva 2 puffs once a day as well as Singulair daily.  She is not sure if she is really seeing any benefit with these medications.  She has been feeling some chest tightness with her cough.  She does feel she gets relief with use of albuterol inhaler and reports using on average twice a day.  She however has not had any urgent care or ED visits or any systemic steroid needs since her last visit.  She states she still has reflux symptoms and continues to take Dexilant daily and Pepcid twice a day.  Omeprazole has not been effective in past for controlling reflux symptoms for her.  She does her best to continue her dietary modifications to help improve her reflux control.  She also has access of Flonase and azelastine for nasal congestion and drainage respectively.  Review of systems: Review of Systems  Constitutional: Negative.   HENT: Negative.   Eyes: Negative.   Respiratory:       See HPI  Cardiovascular: Negative.   Gastrointestinal: Positive for heartburn.  Musculoskeletal: Negative.   Skin: Negative.   Neurological: Negative.     All other systems negative unless noted above in HPI  Past medical/social/surgical/family history have been reviewed and are unchanged unless specifically indicated below.  No changes  Medication List: Current Outpatient Medications  Medication Sig Dispense Refill  . albuterol (VENTOLIN HFA)  108 (90 Base) MCG/ACT inhaler INHALE 2 PUFFS BY MOUTH EVERY 6 HOURS AS NEEDED FOR SHORTNESS OF BREATH OR WHEEZING. 18 g 2  . azelastine (ASTELIN) 0.1 % nasal spray SPRAY 1 TO 2 SPRAYS PER NOSTRIL TWICE DAILY. 30 mL 3  . Budeson-Glycopyrrol-Formoterol (BREZTRI AEROSPHERE) 160-9-4.8 MCG/ACT AERO Take 2 puffs by mouth in the morning and at bedtime. 10.7 g 5  . cimetidine (TAGAMET) 800 MG tablet TAKE 1/2 TABLET BY MOUTH ONCE OR TWICE DAILY AS NEEDED 30 tablet 2  . dexlansoprazole (DEXILANT) 60 MG capsule Take 1 capsule (60 mg total) by mouth daily. 30 capsule 5  . FLUoxetine (PROZAC) 20 MG capsule Take 1 capsule by mouth daily.    . fluticasone (FLONASE) 50 MCG/ACT nasal spray Place 2 sprays into both nostrils 2 (two) times daily. 16 g 5  . gabapentin (NEURONTIN) 300 MG capsule     . montelukast (SINGULAIR) 10 MG tablet TAKE ONE TABLET BY MOUTH AT BEDTIME. 28 tablet 4  . nitrofurantoin (MACRODANTIN) 100 MG capsule Take 100 mg by mouth at bedtime.    . NON FORMULARY [facility]  Antifungal (nail)-#1    . omeprazole (PRILOSEC) 20 MG capsule Take 1 capsule (20 mg total) by mouth daily. 30 capsule 3  . Respiratory Therapy Supplies (FLUTTER) DEVI Use as directed 1 each 3  . SF 5000 PLUS 1.1 % CREA dental cream USE AS DIRECTED TO BRUSH TEETH AT BEDTIME    . Spacer/Aero-Holding Chambers (AEROCHAMBER PLUS  WITH MASK) inhaler 1 each by Other route See admin instructions. Use with inhaler as instructed. 1 each 1  . triamcinolone cream (KENALOG) 0.1 %      Current Facility-Administered Medications  Medication Dose Route Frequency Provider Last Rate Last Admin  . predniSONE (DELTASONE) tablet 10 mg  10 mg Oral Q breakfast Bobbitt, Heywood Iles, MD         Known medication allergies: Allergies  Allergen Reactions  . Sulfa Antibiotics Other (See Comments)    Unknown- pt unsure of reaction; believes it may be nausea     Physical examination: Blood pressure 112/66, pulse 69, temperature 98.2 F  (36.8 C), temperature source Temporal, resp. rate 16, SpO2 97 %.  General: Alert, interactive, in no acute distress. HEENT: PERRLA, TMs pearly gray, turbinates non-edematous without discharge, post-pharynx non erythematous. Neck: Supple without lymphadenopathy. Lungs: Clear to auscultation without wheezing, rhonchi or rales. {no increased work of breathing. CV: Normal S1, S2 without murmurs. Abdomen: Nondistended, nontender. Skin: Warm and dry, without lesions or rashes. Extremities:  No clubbing, cyanosis or edema. Neuro:   Grossly intact.  Diagnositics/Labs: None today  Assessment and plan:   Moderate persistent asthma . Daily controller medication(s): start Breztri 2 puffs twice a day.  Rinse mouth after use.   Breztri replaces both Symbicort and Spiriva . Continue montelukast 10mg  daily.  . Prior to physical activity: May use albuterol rescue inhaler 2 puffs 5 to 15 minutes prior to strenuous physical activities. Rescue medications: May use albuterol rescue inhaler 2 puffs or nebulizer every 4 to 6 hours as needed for shortness of breath, chest tightness, coughing, and wheezing. Monitor frequency of use.  . Will obtain CBC w diff to assess your degree of eosinophils and environmental allergy panel to see if you may benefit from asthma biologic medications like Nucala, Fasenra, Dupixent or Xolair  . Asthma control goals:  o Full participation in all desired activities (may need albuterol before activity) o Albuterol use two times or less a week on average (not counting use with activity) o Cough interfering with sleep two times or less a month o Oral steroids no more than once a year o No hospitalizations  Perennial allergic rhinitis with a possible nonallergic component  Continue environmental control measures.  Continue montelukast 10mg  daily.  Continue fluticasone nasal spray 1-2 spray per nostril once a day for nasal congestion.  May use Azelastine nasal  spray, 1-2 sprays per nostril TWICE daily as needed for runny nose.  Nasal saline spray (i.e., Simply Saline) or nasal saline lavage (i.e., NeilMed) is recommended as needed and prior to medicated nasal sprays.  GERD (gastroesophageal reflux disease)  Continue appropriate reflux lifestyle modifications.   Omeprazole has not been effective   Continue Dexilant 60mg  1 tab daily.   Continue Famotidine 20 mg twice daily.  Cut out caffeine.  Change to decaf coffee  Cough  Follow up with Dr. Marland Kitchen (ENT).  Continue Gabapentin 300mg  3 times a day as prescribed by Dr. .   Follow up in 3-4 months or sooner if needed.    I appreciate the opportunity to take part in [name]'s care. Please do not hesitate to contact me with questions.  Sincerely,   , MD Allergy/Immunology [facility]

## 2020-07-23 NOTE — Patient Instructions (Addendum)
Moderate persistent asthma . Daily controller medication(s): start Breztri 2 puffs twice a day.  Rinse mouth after use.   Breztri replaces both Symbicort and Spiriva . Continue montelukast 10mg  daily.  . Prior to physical activity: May use albuterol rescue inhaler 2 puffs 5 to 15 minutes prior to strenuous physical activities. Rescue medications: May use albuterol rescue inhaler 2 puffs or nebulizer every 4 to 6 hours as needed for shortness of breath, chest tightness, coughing, and wheezing. Monitor frequency of use.  . Will obtain CBC w diff to assess your degree of eosinophils and environmental allergy panel to see if you may benefit from asthma biologic medications like Nucala, Fasenra, Dupixent or Xolair  . Asthma control goals:  o Full participation in all desired activities (may need albuterol before activity) o Albuterol use two times or less a week on average (not counting use with activity) o Cough interfering with sleep two times or less a month o Oral steroids no more than once a year o No hospitalizations  Perennial allergic rhinitis with a possible nonallergic component  Continue environmental control measures.  Continue montelukast 10mg  daily.  Continue fluticasone nasal spray 1-2 spray per nostril once a day for nasal congestion.  May use Azelastine nasal spray, 1-2 sprays per nostril TWICE daily as needed for runny nose.  Nasal saline spray (i.e., Simply Saline) or nasal saline lavage (i.e., NeilMed) is recommended as needed and prior to medicated nasal sprays.  GERD (gastroesophageal reflux disease)  Continue appropriate reflux lifestyle modifications.   Omeprazole has not been effective   Continue Dexilant 60mg  1 tab daily.   Continue Famotidine 20 mg twice daily.  Cut out caffeine.  Change to decaf coffee  Cough  Follow up with Dr. Marland Kitchen (ENT).  Continue Gabapentin 300mg  3 times a day as prescribed by Dr. .   Follow up in 3-4 months or  sooner if needed.    Heartburn Heartburn is a type of pain or discomfort that can happen in the throat or chest. It is often described as a burning pain. It may also cause a bad, acid-like taste in the mouth. Heartburn may feel worse when you lie down or bend over. It may be worse at night. It may be caused by stomach contents that move back up (reflux) into the tube that connects the mouth with the stomach (esophagus). Follow these instructions at home: Eating and drinking   Avoid certain foods and drinks as told by your doctor. This may include: ? Coffee and tea (with or without caffeine). ? Drinks that have alcohol. ? Energy drinks and sports drinks. ? Carbonated drinks or sodas. ? Chocolate and cocoa. ? Peppermint and mint flavorings. ? Garlic and onions. ? Horseradish. ? Spicy and acidic foods, such as:  Peppers.  Chili powder and curry powder.  Vinegar.  Hot sauces and BBQ sauce. ? Citrus fruit juices and citrus fruits, such as:  Oranges.  Lemons.  Limes. ? Tomato-based foods, such as:  Red sauce and pizza with red sauce.  Chili.  Salsa. ? Fried and fatty foods, such as:  Donuts.  fries and potato chips.  High-fat dressings. ? High-fat meats, such as:  Hot dogs and sausage.  Rib eye steak.  Ham and bacon. ? High-fat dairy items, such as:  Whole milk.  Butter.  Cream cheese.  Eat small meals often. Avoid eating large meals.  Avoid drinking large amounts of liquid with your meals.  Avoid eating meals during the 2-3  hours before bedtime.  Avoid lying down right after you eat.  Do not exercise right after you eat. Lifestyle      If you are overweight, lose an amount of weight that is healthy for you. Ask your doctor about a safe weight loss goal.  Do not use any products that contain nicotine or tobacco, including cigarettes, e-cigarettes, and chewing tobacco. These can make your symptoms worse. If you need help quitting, ask  your doctor.  Wear loose clothes. Do not wear anything tight around your waist.  Raise (elevate) the head of your bed about 6 inches (15 cm) when you sleep.  Try to lower your stress. If you need help doing this, ask your doctor. General instructions  Pay attention to any changes in your symptoms.  Take over-the-counter and prescription medicines only as told by your doctor. ? Do not take aspirin, ibuprofen, or other NSAIDs unless your doctor says it is okay. ? Stop medicines only as told by your doctor.  Keep all follow-up visits as told by your doctor. This is important. Contact a doctor if:  You have new symptoms.  You lose weight and you do not know why it is happening.  You have trouble swallowing, or it hurts to swallow.  You have wheezing or a cough that keeps happening.  Your symptoms do not get better with treatment.  You have heartburn often for more than 2 weeks. Get help right away if:  You have pain in your arms, neck, jaw, teeth, or back.  You feel sweaty, dizzy, or light-headed.  You have chest pain or shortness of breath.  You throw up (vomit) and your throw up looks like blood or coffee grounds.  Your poop (stool) is bloody or black. These symptoms may represent a serious problem that is an emergency. Do not wait to see if the symptoms will go away. Get medical help right away. Call your local emergency services (911 in the [place] Do not drive yourself to the hospital. Summary  Heartburn is a type of pain that can happen in the throat or chest. It can feel like a burning pain. It may also cause a bad, acid-like taste in the mouth.  You may need to avoid certain foods and drinks to help your symptoms. Ask your doctor what foods and drinks you should avoid.  Take over-the-counter and prescription medicines only as told by your doctor. Do not take aspirin, ibuprofen, or other NSAIDs unless your doctor told you to do so.  Contact your doctor if your  symptoms do not get better or they get worse. This information is not intended to replace advice given to you by your health care provider. Make sure you discuss any questions you have with your health care provider. Document Revised: 12/05/2017 Document Reviewed: 12/05/2017 Elsevier Patient Education  [place].

## 2020-07-25 LAB — CBC WITH DIFFERENTIAL
Basophils Absolute: 0 10*3/uL (ref 0.0–0.2)
Basos: 1 %
EOS (ABSOLUTE): 0.1 10*3/uL (ref 0.0–0.4)
Eos: 2 %
Hematocrit: 40.7 % (ref 34.0–46.6)
Hemoglobin: 13.1 g/dL (ref 11.1–15.9)
Immature Grans (Abs): 0 10*3/uL (ref 0.0–0.1)
Immature Granulocytes: 1 %
Lymphocytes Absolute: 0.7 10*3/uL (ref 0.7–3.1)
Lymphs: 24 %
MCH: 30.6 pg (ref 26.6–33.0)
MCHC: 32.2 g/dL (ref 31.5–35.7)
MCV: 95 fL (ref 79–97)
Monocytes Absolute: 0.4 10*3/uL (ref 0.1–0.9)
Monocytes: 15 %
Neutrophils Absolute: 1.7 10*3/uL (ref 1.4–7.0)
Neutrophils: 57 %
RBC: 4.28 x10E6/uL (ref 3.77–5.28)
RDW: 14.2 % (ref 11.7–15.4)
WBC: 2.9 10*3/uL — ABNORMAL LOW (ref 3.4–10.8)

## 2020-07-25 LAB — ALLERGENS W/TOTAL IGE AREA 2

## 2020-07-29 ENCOUNTER — Other Ambulatory Visit: Payer: Self-pay | Admitting: Allergy and Immunology

## 2020-08-07 ENCOUNTER — Ambulatory Visit (INDEPENDENT_AMBULATORY_CARE_PROVIDER_SITE_OTHER): Payer: Medicare HMO

## 2020-08-07 DIAGNOSIS — J4550 Severe persistent asthma, uncomplicated: Secondary | ICD-10-CM | POA: Diagnosis not present

## 2020-08-07 MED ORDER — NON FORMULARY
210.0000 mg | Status: DC
  Administered 2020-08-07: 210 mg via SUBCUTANEOUS

## 2020-08-07 MED ORDER — EPINEPHRINE 0.3 MG/0.3ML IJ SOAJ: 0.3000 mg | INTRAMUSCULAR | 1 refills | Status: AC | PRN

## 2020-08-07 NOTE — Progress Notes (Signed)
Immunotherapy   Patient Details  Name: [name] MRN: [id number] Date of Birth: 03-24-1951  08/07/2020  [name] is starting Tezspire 210mg /1.17mL today. She will receive the injection every 4 weeks.  Epi-Pen:Prescription for Epi-Pen given Consent signed and patient instructions given.   Rosalio Loud 08/07/2020, 9:48 AM

## 2020-08-12 DIAGNOSIS — K219 Gastro-esophageal reflux disease without esophagitis: Secondary | ICD-10-CM | POA: Diagnosis not present

## 2020-08-12 DIAGNOSIS — F329 Major depressive disorder, single episode, unspecified: Secondary | ICD-10-CM | POA: Diagnosis not present

## 2020-08-12 DIAGNOSIS — J45909 Unspecified asthma, uncomplicated: Secondary | ICD-10-CM | POA: Diagnosis not present

## 2020-08-12 DIAGNOSIS — Z79899 Other long term (current) drug therapy: Secondary | ICD-10-CM | POA: Diagnosis not present

## 2020-08-12 DIAGNOSIS — F422 Mixed obsessional thoughts and acts: Secondary | ICD-10-CM | POA: Diagnosis not present

## 2020-08-18 NOTE — Telephone Encounter (Signed)
Made in error

## 2020-08-20 DIAGNOSIS — D72819 Decreased white blood cell count, unspecified: Secondary | ICD-10-CM | POA: Diagnosis not present

## 2020-08-20 DIAGNOSIS — Z87440 Personal history of urinary (tract) infections: Secondary | ICD-10-CM | POA: Diagnosis not present

## 2020-08-20 DIAGNOSIS — J4540 Moderate persistent asthma, uncomplicated: Secondary | ICD-10-CM | POA: Diagnosis not present

## 2020-08-20 DIAGNOSIS — F325 Major depressive disorder, single episode, in full remission: Secondary | ICD-10-CM | POA: Diagnosis not present

## 2020-09-04 ENCOUNTER — Ambulatory Visit (INDEPENDENT_AMBULATORY_CARE_PROVIDER_SITE_OTHER): Payer: Medicare HMO | Admitting: *Deleted

## 2020-09-04 ENCOUNTER — Other Ambulatory Visit: Payer: Self-pay

## 2020-09-04 DIAGNOSIS — J4550 Severe persistent asthma, uncomplicated: Secondary | ICD-10-CM | POA: Diagnosis not present

## 2020-09-04 DIAGNOSIS — J4540 Moderate persistent asthma, uncomplicated: Secondary | ICD-10-CM

## 2020-09-04 MED ORDER — NON FORMULARY
1.9100 mL | Status: DC
Start: 2020-09-04 — End: 2020-12-29

## 2020-10-02 ENCOUNTER — Other Ambulatory Visit: Payer: Self-pay

## 2020-10-02 ENCOUNTER — Ambulatory Visit (INDEPENDENT_AMBULATORY_CARE_PROVIDER_SITE_OTHER): Payer: Medicare HMO | Admitting: *Deleted

## 2020-10-02 DIAGNOSIS — J4540 Moderate persistent asthma, uncomplicated: Secondary | ICD-10-CM | POA: Diagnosis not present

## 2020-10-02 NOTE — Progress Notes (Signed)
Immunotherapy   Patient Details  Name: Dia MARIAFERNANDA HENDRICKSEN MRN: [id number] Date of Birth: 07-12-1951  10/02/2020  Khrystyna Vennie Homans   Patient received Cheron Every Sample today. She received 1.31mL in the LUA. Patient waited 30 minutes and did not experience any issues.   Lot # [id number] EXP 03/19/2023  Chip Boer 10/02/2020, 10:11 AM

## 2020-10-23 ENCOUNTER — Other Ambulatory Visit: Payer: Self-pay

## 2020-10-23 ENCOUNTER — Ambulatory Visit: Payer: Medicare HMO | Admitting: Allergy

## 2020-10-23 ENCOUNTER — Encounter: Payer: Self-pay | Admitting: Allergy

## 2020-10-23 VITALS — BP 118/68 | HR 67 | Temp 97.9°F | Resp 17 | Ht 60.0 in | Wt 165.0 lb

## 2020-10-23 DIAGNOSIS — R053 Chronic cough: Secondary | ICD-10-CM

## 2020-10-23 DIAGNOSIS — J3089 Other allergic rhinitis: Secondary | ICD-10-CM | POA: Diagnosis not present

## 2020-10-23 DIAGNOSIS — K219 Gastro-esophageal reflux disease without esophagitis: Secondary | ICD-10-CM | POA: Diagnosis not present

## 2020-10-23 DIAGNOSIS — J4540 Moderate persistent asthma, uncomplicated: Secondary | ICD-10-CM

## 2020-10-23 NOTE — Patient Instructions (Addendum)
Moderate persistent asthma Doing much better!  Lung function looks great today! . Daily controller medication(s):  o Breztri 174mcg 2 puffs twice a day.  Rinse mouth after use.    o Tezspire monthly injections.  Next appt 10/30/20 around 11am (call 30 min prior so it can be ready for you) . Continue montelukast 10mg  daily.  . Prior to physical activity: May use albuterol rescue inhaler 2 puffs 5 to 15 minutes prior to strenuous physical activities. Marland Kitchen Rescue medications: May use albuterol rescue inhaler 2 puffs or nebulizer every 4 to 6 hours as needed for shortness of breath, chest tightness, coughing, and wheezing. Monitor frequency of use.   . Asthma control goals:  o Full participation in all desired activities (may need albuterol before activity) o Albuterol use two times or less a week on average (not counting use with activity) o Cough interfering with sleep two times or less a month o Oral steroids no more than once a year o No hospitalizations  Perennial allergic rhinitis with a possible nonallergic component  Continue environmental control measures.  Continue montelukast 10mg  daily.  Continue fluticasone nasal spray 1-2 spray per nostril once a day for nasal congestion.  Stop Azelastine (blue top) nasal spray  Start nasal Atrovent 2 sprays each nostril up to 3-4 times a day as needed for runny nose/nasal drainage.   If nose gets too dry stop use and can use nasal saline spray  Nasal saline spray (i.e., Simply Saline) or nasal saline lavage (i.e., NeilMed) is recommended as needed and prior to medicated nasal sprays.  GERD (gastroesophageal reflux disease)  Continue appropriate reflux lifestyle modifications.   Omeprazole has not been effective   Continue Dexilant 60mg  1 tab daily.   Continue Famotidine 20 mg twice daily.  Cut out caffeine.  Change to decaf coffee  Cough  Improved with above asthma regimen  Follow up with Dr. Benjamine Mola (ENT).  Continue Gabapentin  300mg  3 times a day as prescribed by Dr. Benjamine Mola.   Follow up in 4-6 months or sooner if needed.

## 2020-10-23 NOTE — Progress Notes (Signed)
Follow-up Note  RE: Michaline PATTIANN SOLANKI MRN: [id number] DOB: 09-Nov-1950 Date of Office Visit: 10/23/2020   History of present illness: [name] is a 70 y.o. female presenting today for follow-up of asthma, allergic rhinitis with nonallergic component, GERD and cough.  She was last seen in the office on 07/23/2020 by myself.  At that time we discussed making some changes with her asthma management.  Start her on Breztri inhaler to replace Symbicort and Spiriva which she has been using 2 puffs twice a day.  I also started her on a new biologic asthma medication Tezspire.  Thus far she has had 3 doses.  It is a once a month injection and she is tolerating this well.  She states she is so much better.  She states her son has noticed improvement in her symptoms.  She states she coughs very infrequently at this time.  She has not needed to use albuterol inhaler.  She is very pleased with how things have gone thus far. She continues on singular daily for allergy symptom control.  She has been using fluticasone and azelastine nasal spray but reports she still has nasal drainage. She feels like her reflux has been doing well and takes Dexilant and famotidine. She sees Dr. Benjamine Mola with ENT for chronic cough who has her on gabapentin. Her most concerning issue is she has been having vertigo that seems to be quite positional.  She states she was started on meclizine and diazepam which she states has helped with the vertigo.  She will be seeing another ENT next week for her vertigo  Review of systems: Review of Systems  Constitutional: Negative.   HENT:       See HPI  Eyes: Negative.   Respiratory: Negative.   Cardiovascular: Negative.   Gastrointestinal: Negative.   Musculoskeletal: Negative.   Skin: Negative.   Neurological: Positive for dizziness.    All other systems negative unless noted above in HPI  Past medical/social/surgical/family history have been reviewed and are unchanged unless  specifically indicated below.  No changes  Medication List: Current Outpatient Medications  Medication Sig Dispense Refill  . albuterol (VENTOLIN HFA) 108 (90 Base) MCG/ACT inhaler INHALE 2 PUFFS BY MOUTH EVERY 6 HOURS AS NEEDED FOR SHORTNESS OF BREATH OR WHEEZING. 18 g 2  . azelastine (ASTELIN) 0.1 % nasal spray SPRAY 1 TO 2 SPRAYS PER NOSTRIL TWICE DAILY. 30 mL 3  . Budeson-Glycopyrrol-Formoterol (BREZTRI AEROSPHERE) 160-9-4.8 MCG/ACT AERO Take 2 puffs by mouth in the morning and at bedtime. 10.7 g 5  . cimetidine (TAGAMET) 800 MG tablet TAKE 1/2 TABLET BY MOUTH ONCE OR TWICE DAILY AS NEEDED 30 tablet 2  . dexlansoprazole (DEXILANT) 60 MG capsule Take 1 capsule (60 mg total) by mouth daily. 30 capsule 5  . EPINEPHrine 0.3 mg/0.3 mL IJ SOAJ injection Inject 0.3 mg into the muscle as needed for anaphylaxis. 2 each 1  . FLUoxetine (PROZAC) 20 MG capsule Take 1 capsule by mouth daily.    . fluticasone (FLONASE) 50 MCG/ACT nasal spray Place 2 sprays into both nostrils 2 (two) times daily. 16 g 5  . gabapentin (NEURONTIN) 300 MG capsule     . montelukast (SINGULAIR) 10 MG tablet TAKE ONE TABLET BY MOUTH AT BEDTIME. 30 tablet 5  . nitrofurantoin (MACRODANTIN) 100 MG capsule Take 100 mg by mouth at bedtime.    . NON FORMULARY [facility]  Antifungal (nail)-#1    . omeprazole (PRILOSEC) 20 MG capsule Take 1 capsule (  20 mg total) by mouth daily. 30 capsule 3  . Respiratory Therapy Supplies (FLUTTER) DEVI Use as directed 1 each 3  . SF 5000 PLUS 1.1 % CREA dental cream USE AS DIRECTED TO BRUSH TEETH AT BEDTIME    . Spacer/Aero-Holding Chambers (AEROCHAMBER PLUS WITH MASK) inhaler 1 each by Other route See admin instructions. Use with inhaler as instructed. 1 each 1  . triamcinolone cream (KENALOG) 0.1 %      Current Facility-Administered Medications  Medication Dose Route Frequency Provider Last Rate Last Admin  . NON FORMULARY 1.91 mL  1.91 mL Subcutaneous Q28 days Kennith Gain, MD      . NON FORMULARY 210 mg  210 mg Subcutaneous Q28 days Valentina Shaggy, MD   210 mg at 08/07/20 6195  . predniSONE (DELTASONE) tablet 10 mg  10 mg Oral Q breakfast Bobbitt, Sedalia Muta, MD         Known medication allergies: Allergies  Allergen Reactions  . Sulfa Antibiotics Other (See Comments)    Unknown- pt unsure of reaction; believes it may be nausea     Physical examination: Blood pressure 118/68, pulse 67, temperature 97.9 F (36.6 C), temperature source Temporal, resp. rate 17, height 5' (1.524 m), weight 165 lb (74.8 kg), SpO2 96 %.  General: Alert, interactive, in no acute distress, did not cough during encounter. HEENT: PERRLA, TMs pearly gray, turbinates minimally edematous with clear discharge, post-pharynx non erythematous. Neck: Supple without lymphadenopathy. Lungs: Clear to auscultation without wheezing, rhonchi or rales. {no increased work of breathing. CV: Normal S1, S2 without murmurs. Abdomen: Nondistended, nontender. Skin: Warm and dry, without lesions or rashes. Extremities:  No clubbing, cyanosis or edema. Neuro:   Grossly intact.  Diagnositics/Labs:  Spirometry: FEV1: 1.64L 85%, FVC: 2.22L 90%, ratio consistent with Nonobstructive pattern.  This is greatly improved from previous study  Assessment and plan:   Moderate persistent asthma Doing much better!  Lung function looks great today! . Daily controller medication(s):  o Breztri 178mcg 2 puffs twice a day.  Rinse mouth after use.    o Tezspire monthly injections.  Next appt 10/30/20 around 11am (call 30 min prior so it can be ready for you) . Continue montelukast 10mg  daily.  . Prior to physical activity: May use albuterol rescue inhaler 2 puffs 5 to 15 minutes prior to strenuous physical activities. Marland Kitchen Rescue medications: May use albuterol rescue inhaler 2 puffs or nebulizer every 4 to 6 hours as needed for shortness of breath, chest tightness, coughing, and wheezing. Monitor  frequency of use.   . Asthma control goals:  o Full participation in all desired activities (may need albuterol before activity) o Albuterol use two times or less a week on average (not counting use with activity) o Cough interfering with sleep two times or less a month o Oral steroids no more than once a year o No hospitalizations  Perennial allergic rhinitis with a possible nonallergic component  Continue environmental control measures.  Continue montelukast 10mg  daily.  Continue fluticasone nasal spray 1-2 spray per nostril once a day for nasal congestion.  Stop Azelastine (blue top) nasal spray  Start nasal Atrovent 2 sprays each nostril up to 3-4 times a day as needed for runny nose/nasal drainage.   If nose gets too dry stop use and can use nasal saline spray  Nasal saline spray (i.e., Simply Saline) or nasal saline lavage (i.e., NeilMed) is recommended as needed and prior to medicated nasal sprays.  GERD (gastroesophageal reflux  disease)  Continue appropriate reflux lifestyle modifications.   Omeprazole has not been effective   Continue Dexilant 60mg  1 tab daily.   Continue Famotidine 20 mg twice daily.  Cut out caffeine.  Change to decaf coffee  Cough  Improved with above asthma regimen  Follow up with Dr. Benjamine Mola (ENT).  Continue Gabapentin 300mg  3 times a day as prescribed by Dr. Benjamine Mola.   Follow up in 4-6 months or sooner if needed.   I appreciate the opportunity to take part in Sidnie's care. Please do not hesitate to contact me with questions.  Sincerely,   Prudy Feeler, MD Allergy/Immunology [facility] [place] [facility] [place]

## 2020-10-24 ENCOUNTER — Telehealth: Payer: Self-pay | Admitting: Allergy

## 2020-10-24 MED ORDER — IPRATROPIUM BROMIDE 0.06 % NA SOLN: 2.0000 | Freq: Three times a day (TID) | NASAL | 5 refills | Status: DC | PRN

## 2020-10-24 NOTE — Telephone Encounter (Signed)
Patient was seen yesterday, 10/23/20 and she said a prescription for Atrovent was going to be sent to her pharmacy and it has not been sent. [facility] in [place], [place].

## 2020-10-24 NOTE — Telephone Encounter (Signed)
Atrovent nasal spray was sent to requested pharmacy.

## 2020-10-30 DIAGNOSIS — R42 Dizziness and giddiness: Secondary | ICD-10-CM | POA: Diagnosis not present

## 2020-10-30 DIAGNOSIS — H9042 Sensorineural hearing loss, unilateral, left ear, with unrestricted hearing on the contralateral side: Secondary | ICD-10-CM | POA: Diagnosis not present

## 2020-10-30 DIAGNOSIS — J3089 Other allergic rhinitis: Secondary | ICD-10-CM | POA: Diagnosis not present

## 2020-11-03 ENCOUNTER — Ambulatory Visit (INDEPENDENT_AMBULATORY_CARE_PROVIDER_SITE_OTHER): Payer: Medicare HMO | Admitting: *Deleted

## 2020-11-03 ENCOUNTER — Other Ambulatory Visit: Payer: Self-pay

## 2020-11-03 DIAGNOSIS — J4540 Moderate persistent asthma, uncomplicated: Secondary | ICD-10-CM

## 2020-11-03 DIAGNOSIS — J4550 Severe persistent asthma, uncomplicated: Secondary | ICD-10-CM | POA: Diagnosis not present

## 2020-11-03 MED ORDER — TEZEPELUMAB-EKKO 210 MG/1.91ML ~~LOC~~ SOSY
210.0000 mg | PREFILLED_SYRINGE | Freq: Once | SUBCUTANEOUS | Status: AC
  Administered 2020-11-03: 210 mg via SUBCUTANEOUS

## 2020-11-06 DIAGNOSIS — R42 Dizziness and giddiness: Secondary | ICD-10-CM | POA: Diagnosis not present

## 2020-11-06 DIAGNOSIS — H9192 Unspecified hearing loss, left ear: Secondary | ICD-10-CM | POA: Diagnosis not present

## 2020-12-01 ENCOUNTER — Other Ambulatory Visit: Payer: Self-pay

## 2020-12-01 ENCOUNTER — Ambulatory Visit (INDEPENDENT_AMBULATORY_CARE_PROVIDER_SITE_OTHER): Payer: Medicare HMO | Admitting: *Deleted

## 2020-12-01 DIAGNOSIS — J4550 Severe persistent asthma, uncomplicated: Secondary | ICD-10-CM | POA: Diagnosis not present

## 2020-12-01 DIAGNOSIS — J4540 Moderate persistent asthma, uncomplicated: Secondary | ICD-10-CM

## 2020-12-01 MED ORDER — TEZEPELUMAB-EKKO 210 MG/1.91ML ~~LOC~~ SOSY
210.0000 mg | PREFILLED_SYRINGE | Freq: Once | SUBCUTANEOUS | Status: AC
  Administered 2020-12-01: 210 mg via SUBCUTANEOUS

## 2020-12-26 DIAGNOSIS — M170 Bilateral primary osteoarthritis of knee: Secondary | ICD-10-CM | POA: Diagnosis not present

## 2020-12-29 ENCOUNTER — Other Ambulatory Visit: Payer: Self-pay

## 2020-12-29 ENCOUNTER — Ambulatory Visit (INDEPENDENT_AMBULATORY_CARE_PROVIDER_SITE_OTHER): Payer: Medicare HMO

## 2020-12-29 DIAGNOSIS — J4540 Moderate persistent asthma, uncomplicated: Secondary | ICD-10-CM

## 2020-12-29 DIAGNOSIS — J4550 Severe persistent asthma, uncomplicated: Secondary | ICD-10-CM | POA: Diagnosis not present

## 2020-12-29 MED ORDER — TEZEPELUMAB-EKKO 210 MG/1.91ML ~~LOC~~ SOSY
210.0000 mg | PREFILLED_SYRINGE | SUBCUTANEOUS | Status: AC
  Administered 2020-12-29 – 2024-05-18 (×43): 210 mg via SUBCUTANEOUS

## 2021-01-05 DIAGNOSIS — L821 Other seborrheic keratosis: Secondary | ICD-10-CM | POA: Diagnosis not present

## 2021-01-05 DIAGNOSIS — B078 Other viral warts: Secondary | ICD-10-CM | POA: Diagnosis not present

## 2021-01-05 DIAGNOSIS — L853 Xerosis cutis: Secondary | ICD-10-CM | POA: Diagnosis not present

## 2021-01-12 DIAGNOSIS — K219 Gastro-esophageal reflux disease without esophagitis: Secondary | ICD-10-CM | POA: Diagnosis not present

## 2021-01-12 DIAGNOSIS — J45909 Unspecified asthma, uncomplicated: Secondary | ICD-10-CM | POA: Insufficient documentation

## 2021-01-12 DIAGNOSIS — R053 Chronic cough: Secondary | ICD-10-CM | POA: Diagnosis not present

## 2021-01-12 DIAGNOSIS — J3089 Other allergic rhinitis: Secondary | ICD-10-CM | POA: Diagnosis not present

## 2021-01-12 DIAGNOSIS — H9042 Sensorineural hearing loss, unilateral, left ear, with unrestricted hearing on the contralateral side: Secondary | ICD-10-CM | POA: Diagnosis not present

## 2021-01-26 ENCOUNTER — Other Ambulatory Visit: Payer: Self-pay

## 2021-01-26 ENCOUNTER — Ambulatory Visit (INDEPENDENT_AMBULATORY_CARE_PROVIDER_SITE_OTHER): Payer: Medicare HMO

## 2021-01-26 DIAGNOSIS — J4540 Moderate persistent asthma, uncomplicated: Secondary | ICD-10-CM

## 2021-01-26 DIAGNOSIS — J4550 Severe persistent asthma, uncomplicated: Secondary | ICD-10-CM | POA: Diagnosis not present

## 2021-02-07 NOTE — Addendum Note (Signed)
Addended by: Carin Hock on: 02/07/2021 02:20 PM   Modules accepted: Orders

## 2021-02-13 ENCOUNTER — Telehealth: Payer: Self-pay

## 2021-02-13 NOTE — Telephone Encounter (Signed)
[place] called to get refills on the [facility] and Breztri inhaler. Patient has an office visit on 02/23/21. I verbally sent in a sent in a 30 day refill for both the singular and the [facility] inhaler. I also notified the pharmacist that the patient must keep her appointment for further refills.

## 2021-02-19 DIAGNOSIS — K219 Gastro-esophageal reflux disease without esophagitis: Secondary | ICD-10-CM | POA: Diagnosis not present

## 2021-02-19 DIAGNOSIS — R053 Chronic cough: Secondary | ICD-10-CM | POA: Diagnosis not present

## 2021-02-19 DIAGNOSIS — H903 Sensorineural hearing loss, bilateral: Secondary | ICD-10-CM | POA: Diagnosis not present

## 2021-02-23 ENCOUNTER — Ambulatory Visit (INDEPENDENT_AMBULATORY_CARE_PROVIDER_SITE_OTHER): Payer: Medicare HMO | Admitting: *Deleted

## 2021-02-23 ENCOUNTER — Other Ambulatory Visit: Payer: Self-pay

## 2021-02-23 DIAGNOSIS — J4540 Moderate persistent asthma, uncomplicated: Secondary | ICD-10-CM

## 2021-02-25 DIAGNOSIS — R053 Chronic cough: Secondary | ICD-10-CM | POA: Diagnosis not present

## 2021-02-25 DIAGNOSIS — F3340 Major depressive disorder, recurrent, in remission, unspecified: Secondary | ICD-10-CM | POA: Diagnosis not present

## 2021-02-25 DIAGNOSIS — J4540 Moderate persistent asthma, uncomplicated: Secondary | ICD-10-CM | POA: Diagnosis not present

## 2021-02-26 DIAGNOSIS — J4550 Severe persistent asthma, uncomplicated: Secondary | ICD-10-CM | POA: Diagnosis not present

## 2021-03-03 ENCOUNTER — Ambulatory Visit: Payer: Medicare HMO | Admitting: Podiatry

## 2021-03-03 ENCOUNTER — Other Ambulatory Visit: Payer: Self-pay

## 2021-03-03 DIAGNOSIS — M79675 Pain in left toe(s): Secondary | ICD-10-CM

## 2021-03-03 DIAGNOSIS — B351 Tinea unguium: Secondary | ICD-10-CM

## 2021-03-03 DIAGNOSIS — M79674 Pain in right toe(s): Secondary | ICD-10-CM | POA: Diagnosis not present

## 2021-03-06 NOTE — Progress Notes (Signed)
Subjective: 70 year old female presents the office today for concerns of fungus on her toes particularly right second total nail.  She said the nails gotten long and curving underneath causing discomfort.  No swelling or redness or any drainage.  No recent treatment.  She has no other concerns.  Objective: AAO x3, NAD DP/PT pulses palpable bilaterally, CRT less than 3 seconds In particular her right second nails hypertrophic, dystrophic with brown discoloration.  The nail is curving towards the distal portion of the toe causing discomfort.  There is no edema, erythema, drainage or pus or any signs of infection noted.  There is no open lesions. No open lesions or pre-ulcerative lesions.  No pain with calf compression, swelling, warmth, erythema  Assessment: Onychomycosis, right second toenail pain  Plan: -All treatment options discussed with the patient including all alternatives, risks, complications.  -Sharply debrided the toenails and complications or bleeding.  Continue topical antifungal.  If needed consider nail removal in the future. -Patient encouraged to call the office with any questions, concerns, change in symptoms.   Trula Slade DPM

## 2021-03-20 DIAGNOSIS — U099 Post covid-19 condition, unspecified: Secondary | ICD-10-CM | POA: Diagnosis not present

## 2021-03-20 DIAGNOSIS — R059 Cough, unspecified: Secondary | ICD-10-CM | POA: Diagnosis not present

## 2021-03-20 DIAGNOSIS — J3489 Other specified disorders of nose and nasal sinuses: Secondary | ICD-10-CM | POA: Diagnosis not present

## 2021-03-24 ENCOUNTER — Emergency Department (HOSPITAL_COMMUNITY)
Admission: EM | Admit: 2021-03-24 | Discharge: 2021-03-24 | Disposition: A | Discharge status: Home / Self Care | Payer: Medicare HMO | Attending: Emergency Medicine | Admitting: Emergency Medicine

## 2021-03-24 ENCOUNTER — Emergency Department (HOSPITAL_COMMUNITY): Payer: Medicare HMO

## 2021-03-24 ENCOUNTER — Encounter (HOSPITAL_COMMUNITY): Payer: Self-pay

## 2021-03-24 ENCOUNTER — Other Ambulatory Visit: Payer: Self-pay

## 2021-03-24 DIAGNOSIS — I509 Heart failure, unspecified: Secondary | ICD-10-CM | POA: Diagnosis not present

## 2021-03-24 DIAGNOSIS — U071 COVID-19: Secondary | ICD-10-CM | POA: Insufficient documentation

## 2021-03-24 DIAGNOSIS — J4540 Moderate persistent asthma, uncomplicated: Secondary | ICD-10-CM | POA: Insufficient documentation

## 2021-03-24 DIAGNOSIS — R0602 Shortness of breath: Secondary | ICD-10-CM | POA: Diagnosis present

## 2021-03-24 DIAGNOSIS — R059 Cough, unspecified: Secondary | ICD-10-CM | POA: Diagnosis not present

## 2021-03-24 NOTE — Discharge Instructions (Addendum)
Your x-ray looks good today.  Respiratory function is good.  Follow-up with her primary care doctor as needed.

## 2021-03-24 NOTE — ED Triage Notes (Signed)
Pt to er room number 10, pt states that she had covid on the 24th of august, states that she has taken the anti viral and steroids, states that she is here because she still doesn't feel well, pt has cough.

## 2021-03-24 NOTE — ED Notes (Signed)
Pt provided discharge instructions and prescription information. Pt was given the opportunity to ask questions and questions were answered. Discharge signature not obtained in the setting of the COVID-19 pandemic in order to reduce high touch surfaces.  ° °

## 2021-03-24 NOTE — ED Provider Notes (Signed)
[facility] EMERGENCY DEPARTMENT Provider Note   CSN: [id number] Arrival date & time: 03/24/21  [id number]     History Chief Complaint  Patient presents with   Shortness of Breath    [name] is a 70 y.o. female.   Shortness of Breath Associated symptoms: wheezing   Associated symptoms: no abdominal pain, no chest pain and no rash   Patient presents with shortness of breath.  Diagnosed with COVID on 24 August.  Had been on Paxil bid starting that day and recently started on steroids.  Went to an urgent care for that.  Has had cough with some mild sputum production although not yellow.  States she just is not getting that much better.  History of asthma.  No fevers or chills.  States she has not improved somewhat she was hoping.  No nausea or vomiting.    Past Medical History:  Diagnosis Date   Anxiety    Asthma    had 1 episode 1 year ago-no more problem   CHF (congestive heart failure) (HCC)    swelling of feet & ankles   Cough    Depression    OCD   GERD (gastroesophageal reflux disease)    severe   OCD (obsessive compulsive disorder)    Shortness of breath    with exertion   Yeast infection 04/09/2014    Patient Active Problem List   Diagnosis Date Noted   Hoarseness 03/14/2018   Perennial allergic rhinitis with a possible nonallergic component 10/04/2017   Moderate persistent asthma 10/04/2017   Oropharyngeal dysphagia 05/06/2017   Gastroesophageal reflux disease without esophagitis    Constipation 11/26/2015   Yeast infection 04/09/2014   Obesity (BMI 30-39.9) 01/31/2014   Cough, persistent    CHF (congestive heart failure) ([place])    Depression    Anxiety    Encounter for screening colonoscopy 05/28/2012   GERD (gastroesophageal reflux disease) 05/28/2012   Low back pain 06/17/2011    Past Surgical History:  Procedure Laterality Date   CARPAL TUNNEL RELEASE  03/22/2012   Procedure: CARPAL TUNNEL RELEASE;  Surgeon: Wynonia Sours, MD;  Location: [facility];  Service: Orthopedics;  Laterality: Right;  right carpal tunnel release   COLONOSCOPY  06/19/2012   Dr. Rourk:normal rectum and colon    ESOPHAGOGASTRODUODENOSCOPY N/A 12/26/2015   Dr. Gala Romney: normal    TONSILLECTOMY     TUBAL LIGATION       OB History     Gravida  1   Para  1   Term      Preterm      AB      Living  1      SAB      IAB      Ectopic      Multiple      Live Births              Family History  Problem Relation Age of Onset   Asthma Mother    Macular degeneration Mother    Hypertension Mother    Alzheimer's disease Father    Other Sister        blood clots   Other Son        enlarged spleen   Diabetes Maternal Grandfather    Diabetes Paternal Grandfather    Colon cancer Neg Hx     Social History   Tobacco Use   Smoking status: Never    Passive exposure: Yes   Smokeless  tobacco: Never  Vaping Use   Vaping Use: Never used  Substance Use Topics   Alcohol use: No   Drug use: No    Home Medications Prior to Admission medications   Medication Sig Start Date End Date Taking? Authorizing Provider  albuterol (VENTOLIN HFA) 108 (90 Base) MCG/ACT inhaler INHALE 2 PUFFS BY MOUTH EVERY 6 HOURS AS NEEDED FOR SHORTNESS OF BREATH OR WHEEZING. 04/25/20   Kennith Gain, MD  azelastine (ASTELIN) 0.1 % nasal spray SPRAY 1 TO 2 SPRAYS PER NOSTRIL TWICE DAILY. 08/10/19   Bobbitt, Sedalia Muta, MD  Budeson-Glycopyrrol-Formoterol (BREZTRI AEROSPHERE) 160-9-4.8 MCG/ACT AERO Take 2 puffs by mouth in the morning and at bedtime. 07/23/20   Kennith Gain, MD  cimetidine (TAGAMET) 800 MG tablet TAKE 1/2 TABLET BY MOUTH ONCE OR TWICE DAILY AS NEEDED 04/13/19   Bobbitt, Sedalia Muta, MD  dexlansoprazole (DEXILANT) 60 MG capsule Take 1 capsule (60 mg total) by mouth daily. 05/07/20   Kennith Gain, MD  EPINEPHrine 0.3 mg/0.3 mL IJ SOAJ injection Inject 0.3 mg into the muscle as needed for anaphylaxis. 08/07/20    Valentina Shaggy, MD  FLUoxetine (PROZAC) 20 MG capsule Take 1 capsule by mouth daily.    [provider]  fluticasone (FLONASE) 50 MCG/ACT nasal spray Place 2 sprays into both nostrils 2 (two) times daily. 12/17/14   [name] Stain, MD  gabapentin (NEURONTIN) 300 MG capsule  08/10/18   [provider]  ipratropium (ATROVENT) 0.06 % nasal spray Place 2 sprays into both nostrils 3 (three) times daily as needed. 10/24/20   Kennith Gain, MD  montelukast (SINGULAIR) 10 MG tablet TAKE ONE TABLET BY MOUTH AT BEDTIME. 07/29/20   Padgett, Rae Halsted, MD  nitrofurantoin (MACRODANTIN) 100 MG capsule Take 100 mg by mouth at bedtime.    [provider]  NON [facility]  Antifungal (nail)-#1    [provider]  NON FORMULARY [facility]  Anti-fungal (nail)-#1    [provider]  omeprazole (PRILOSEC) 20 MG capsule Take 1 capsule (20 mg total) by mouth daily. 01/11/20   Kennith Gain, MD  Respiratory Therapy Supplies (FLUTTER) DEVI Use as directed 10/04/17   Bobbitt, Sedalia Muta, MD  SF 5000 PLUS 1.1 % CREA dental cream USE AS DIRECTED TO BRUSH TEETH AT BEDTIME 01/29/19   [provider]  Spacer/Aero-Holding Chambers (AEROCHAMBER PLUS WITH MASK) inhaler 1 each by Other route See admin instructions. Use with inhaler as instructed. 02/20/20   Kennith Gain, MD  triamcinolone cream (KENALOG) 0.1 %  03/11/20   [provider]    Allergies    Sulfa antibiotics and Cephalosporins  Review of Systems   Review of Systems  Constitutional:  Positive for fatigue. Negative for appetite change.  HENT:  Positive for congestion.   Respiratory:  Positive for shortness of breath and wheezing.   Cardiovascular:  Negative for chest pain and leg swelling.  Gastrointestinal:  Negative for abdominal pain.  Genitourinary:  Negative for flank pain.  Musculoskeletal:  Negative for back pain.   Skin:  Negative for rash.  Neurological:  Negative for weakness.  Psychiatric/Behavioral:  Negative for confusion.    Physical Exam Updated Vital Signs BP 121/71   Pulse 61   Temp 99.4 F (37.4 C) (Oral)   Resp 15   Ht 5' (1.524 m)   Wt 70.8 kg   SpO2 90%   BMI 30.47 kg/m   Physical Exam Vitals and nursing note reviewed.  HENT:     Head: Normocephalic.  Cardiovascular:     Rate and Rhythm: Normal rate and regular rhythm.  Pulmonary:     Effort: No tachypnea.     Comments: Mildly harsh breath sounds without focal rales rhonchi or wheezes. Abdominal:     Tenderness: There is no abdominal tenderness.  Musculoskeletal:     Right lower leg: No tenderness. No edema.     Left lower leg: No tenderness. No edema.  Skin:    General: Skin is warm.     Capillary Refill: Capillary refill takes less than 2 seconds.  Neurological:     Mental Status: She is alert and oriented to person, place, and time.    ED Results / Procedures / Treatments   Labs (all labs ordered are listed, but only abnormal results are displayed) Labs Reviewed - No data to display  EKG None  Radiology DG Chest Portable 1 View  Result Date: 03/24/2021 CLINICAL DATA:  Cough and malaise.  Recent COVID infection. EXAM: PORTABLE CHEST 1 VIEW COMPARISON:  Chest x-ray dated February 24, 2019. FINDINGS: The heart size and mediastinal contours are within normal limits. Tortuous descending thoracic aorta again noted. Normal pulmonary vascularity. No focal consolidation, pleural effusion, or pneumothorax. No acute osseous abnormality. IMPRESSION: 1. No active disease. Electronically Signed   By: Titus Dubin M.D.   On: 03/24/2021 11:15    Procedures Procedures   Medications Ordered in ED Medications - No data to display  ED Course  I have reviewed the triage vital signs and the nursing notes.  Pertinent labs & imaging results that were available during my care of the patient were reviewed by me and considered  in my medical decision making (see chart for details).    MDM Rules/Calculators/A&P                           Patient with shortness of breath and cough.  Recently had COVID.  States she is just not feeling better.  Has been on steroids.  X-ray reassuring.  Not hypoxic.  Do not think we need to refill steroids.  Do not feel as we need antibiotics.  Discussed with patient.  Had been on Paxil bid and had symptoms recur.  After discussion with patient we will have her check a home antigen test.  If positive will continue to isolate.  If negative should be cleared because we are over 10 days from symptoms.  Discharge home Final Clinical Impression(s) / ED Diagnoses Final diagnoses:  [id number]    Rx / DC Orders ED Discharge Orders     None        Davonna Belling, MD 03/24/21 1211

## 2021-03-25 ENCOUNTER — Ambulatory Visit: Payer: Medicare HMO | Admitting: Allergy

## 2021-04-01 ENCOUNTER — Other Ambulatory Visit: Payer: Self-pay | Admitting: *Deleted

## 2021-04-01 MED ORDER — BREZTRI AEROSPHERE 160-9-4.8 MCG/ACT IN AERO: 2.0000 | INHALATION_SPRAY | Freq: Two times a day (BID) | RESPIRATORY_TRACT | 5 refills | Status: DC

## 2021-04-02 ENCOUNTER — Other Ambulatory Visit: Payer: Self-pay

## 2021-04-02 ENCOUNTER — Ambulatory Visit: Payer: Medicare HMO | Admitting: Allergy

## 2021-04-02 ENCOUNTER — Ambulatory Visit (INDEPENDENT_AMBULATORY_CARE_PROVIDER_SITE_OTHER): Payer: Medicare HMO | Admitting: *Deleted

## 2021-04-02 ENCOUNTER — Encounter: Payer: Self-pay | Admitting: Allergy

## 2021-04-02 VITALS — BP 118/76 | HR 62 | Temp 98.1°F | Resp 16 | Ht 60.0 in | Wt 158.2 lb

## 2021-04-02 DIAGNOSIS — K219 Gastro-esophageal reflux disease without esophagitis: Secondary | ICD-10-CM | POA: Diagnosis not present

## 2021-04-02 DIAGNOSIS — J3089 Other allergic rhinitis: Secondary | ICD-10-CM

## 2021-04-02 DIAGNOSIS — J4540 Moderate persistent asthma, uncomplicated: Secondary | ICD-10-CM

## 2021-04-02 DIAGNOSIS — R053 Chronic cough: Secondary | ICD-10-CM | POA: Diagnosis not present

## 2021-04-02 NOTE — Patient Instructions (Addendum)
Moderate persistent asthma Daily controller medication(s):  Breztri 153mg 2 puffs twice a day.  Rinse mouth after use.    Tezspire monthly injections.  Continue montelukast '10mg'$  daily.  Prior to physical activity: May use albuterol rescue inhaler 2 puffs 5 to 15 minutes prior to strenuous physical activities. Rescue medications: May use albuterol rescue inhaler 2 puffs or nebulizer every 4 to 6 hours as needed for shortness of breath, chest tightness, coughing, and wheezing. Monitor frequency of use.   Asthma control goals:  Full participation in all desired activities (may need albuterol before activity) Albuterol use two times or less a week on average (not counting use with activity) Cough interfering with sleep two times or less a month Oral steroids no more than once a year No hospitalizations   Perennial allergic rhinitis with a possible nonallergic component Continue environmental control measures. Continue montelukast '10mg'$  daily. Continue fluticasone nasal spray 1-2 spray per nostril once a day for nasal congestion. Continue nasal Atrovent 2 sprays each nostril up to 3-4 times a day as needed for runny nose/nasal drainage.   If nose gets too dry stop use and can use nasal saline spray Nasal saline spray (i.e., Simply Saline) or nasal saline lavage (i.e., NeilMed) is recommended as needed and prior to medicated nasal sprays.    GERD (gastroesophageal reflux disease) Continue appropriate reflux lifestyle modifications.  Omeprazole has not been effective  Continue Dexilant '60mg'$  1 tab daily.  Continue Famotidine 20 mg twice daily. Cut out caffeine.  Change to decaf coffee   Cough Improved with above asthma regimen Follow up with Dr. TBenjamine Mola(ENT). Continue Gabapentin '300mg'$  2 times a day as prescribed by Dr. TBenjamine Mola   Follow up in 4-6 months or sooner if needed.

## 2021-04-02 NOTE — Progress Notes (Signed)
Follow-up Note  RE: [name] MRN: [id number] DOB: Oct 08, 1950 Date of Office Visit: 04/02/2021   History of present illness: [name] is a 70 y.o. female presenting today for follow-up of asthma, allergic rhinitis with nonallergic component, reflux and cough.  She was last seen in the office on 10/23/2020 by myself.  She did have Covid on 8/24 and states she had chills, weakness, nasal congestion, drainage, some cough.  She has had all her vaccines and also states took Paxlovid antiviral regimen as well as prednisone course.  She did well and did not require any hospitalization.  She states she did use her albuterol during this time.  As the only time since the last visit she recalls using albuterol. She states the Judithann Sauger is still working quite well for her.  She also continues to report the Cheron Every is controlling her asthma symptoms well. She does report morning cough when she gets up for the morning related to reflux.  She does report acid reflux in the morning and after she gets up and spits and cough she does feel better.  She does take famotidine and dexilant.   She does continue to take Singulair daily.  The Atrovent has been helping with drainage and congestion The gabapentin has been cut down to twice a day.    Review of systems in the past 4 weeks: Review of Systems  Constitutional:  Positive for chills.  HENT:  Positive for congestion.   Eyes: Negative.   Respiratory:  Positive for cough.   Cardiovascular: Negative.   Gastrointestinal: Negative.   Musculoskeletal: Negative.   Skin: Negative.   Neurological:  Positive for weakness.   All other systems negative unless noted above in HPI  Past medical/social/surgical/family history have been reviewed and are unchanged unless specifically indicated below.  No changes  Medication List: Current Outpatient Medications  Medication Sig Dispense Refill   albuterol (VENTOLIN HFA) 108 (90 Base) MCG/ACT inhaler INHALE 2  PUFFS BY MOUTH EVERY 6 HOURS AS NEEDED FOR SHORTNESS OF BREATH OR WHEEZING. 18 g 2   azelastine (ASTELIN) 0.1 % nasal spray SPRAY 1 TO 2 SPRAYS PER NOSTRIL TWICE DAILY. 30 mL 3   BREZTRI AEROSPHERE 160-9-4.8 MCG/ACT AERO Take 2 puffs by mouth in the morning and at bedtime. 10.7 g 5   cimetidine (TAGAMET) 800 MG tablet TAKE 1/2 TABLET BY MOUTH ONCE OR TWICE DAILY AS NEEDED 30 tablet 2   dexlansoprazole (DEXILANT) 60 MG capsule Take 1 capsule (60 mg total) by mouth daily. 30 capsule 5   EPINEPHrine 0.3 mg/0.3 mL IJ SOAJ injection Inject 0.3 mg into the muscle as needed for anaphylaxis. 2 each 1   FLUoxetine (PROZAC) 20 MG capsule Take 1 capsule by mouth daily.     fluticasone (FLONASE) 50 MCG/ACT nasal spray Place 2 sprays into both nostrils 2 (two) times daily. 16 g 5   gabapentin (NEURONTIN) 300 MG capsule      ipratropium (ATROVENT) 0.06 % nasal spray Place 2 sprays into both nostrils 3 (three) times daily as needed. 15 mL 5   montelukast (SINGULAIR) 10 MG tablet TAKE ONE TABLET BY MOUTH AT BEDTIME. 30 tablet 5   nitrofurantoin (MACRODANTIN) 100 MG capsule Take 100 mg by mouth at bedtime.     NON FORMULARY [facility]  Antifungal (nail)-#1     NON FORMULARY [facility]  Anti-fungal (nail)-#1     omeprazole (PRILOSEC) 20 MG capsule Take 1 capsule (20 mg total) by mouth daily. [place]  capsule 3   Respiratory Therapy Supplies (FLUTTER) DEVI Use as directed 1 each 3   SF 5000 PLUS 1.1 % CREA dental cream USE AS DIRECTED TO BRUSH TEETH AT BEDTIME     Spacer/Aero-Holding Chambers (AEROCHAMBER PLUS WITH MASK) inhaler 1 each by Other route See admin instructions. Use with inhaler as instructed. 1 each 1   triamcinolone cream (KENALOG) 0.1 %      Current Facility-Administered Medications  Medication Dose Route Frequency Provider Last Rate Last Admin   tezepelumab-ekko (TEZSPIRE) 210 MG/1.91ML syringe 210 mg  210 mg Subcutaneous Q28 days Kennith Gain, MD   210 mg at 04/02/21  1030     Known medication allergies: Allergies  Allergen Reactions   Sulfa Antibiotics Other (See Comments)    Unknown- pt unsure of reaction; believes it may be nausea   Cephalosporins Other (See Comments)    Other Reaction: Toxicity     Physical examination: Blood pressure 118/76, pulse 62, temperature 98.1 F (36.7 C), resp. rate 16, height 5' (1.524 m), weight 158 lb 3.2 oz (71.8 kg), SpO2 97 %.  General: Alert, interactive, in no acute distress. HEENT: PERRLA, TMs pearly gray, turbinates non-edematous without discharge, post-pharynx mildly erythematous. Neck: Supple without lymphadenopathy. Lungs: Mildly decreased breath sounds with expiratory wheezing bilaterally. {no increased work of breathing. CV: Normal S1, S2 without murmurs. Abdomen: Nondistended, nontender. Skin: Warm and dry, without lesions or rashes. Extremities:  No clubbing, cyanosis or edema. Neuro:   Grossly intact.  Diagnositics/Labs:  Spirometry: FEV1: 1.3 L 68%, FVC: 1.85 L 73% predicted.  This is reduced from previous study.  Likely secondary to recent COVID illness.  Assessment and plan:   Moderate persistent asthma Daily controller medication(s):  Breztri 16mg 2 puffs twice a day.  Rinse mouth after use.    Tezspire monthly injections.   Continue montelukast '10mg'$  daily.  Prior to physical activity: May use albuterol rescue inhaler 2 puffs 5 to 15 minutes prior to strenuous physical activities. Rescue medications: May use albuterol rescue inhaler 2 puffs or nebulizer every 4 to 6 hours as needed for shortness of breath, chest tightness, coughing, and wheezing. Monitor frequency of use.   Asthma control goals:  Full participation in all desired activities (may need albuterol before activity) Albuterol use two times or less a week on average (not counting use with activity) Cough interfering with sleep two times or less a month Oral steroids no more than once a year No hospitalizations    Perennial allergic rhinitis with a possible nonallergic component Continue environmental control measures. Continue montelukast '10mg'$  daily. Continue fluticasone nasal spray 1-2 spray per nostril once a day for nasal congestion. Continue nasal Atrovent 2 sprays each nostril up to 3-4 times a day as needed for runny nose/nasal drainage.   If nose gets too dry stop use and can use nasal saline spray Nasal saline spray (i.e., Simply Saline) or nasal saline lavage (i.e., NeilMed) is recommended as needed and prior to medicated nasal sprays.   GERD (gastroesophageal reflux disease) Continue appropriate reflux lifestyle modifications.  Omeprazole has not been effective  Continue Dexilant '60mg'$  1 tab daily.  Continue Famotidine 20 mg twice daily. Cut out caffeine.  Change to decaf coffee   Cough Improved with above asthma regimen Follow up with Dr. TBenjamine Mola(ENT). Continue Gabapentin '300mg'$  2 times a day as prescribed by Dr. TBenjamine Mola   Follow up in 4-6 months or sooner if needed.   I appreciate the opportunity to take part in Ellene's care.  Please do not hesitate to contact me with questions.  Sincerely,   Prudy Feeler, MD Allergy/Immunology [facility]

## 2021-04-22 ENCOUNTER — Telehealth: Payer: Self-pay | Admitting: *Deleted

## 2021-04-22 NOTE — Telephone Encounter (Signed)
Patient is calling with concerns of the bottoms of her feet bothering her, doing a lot of walking. Please advise or schedule for appointment for evaluation.

## 2021-04-24 NOTE — Telephone Encounter (Signed)
Returned the call to patient, no answer, left vmessage per Dr Leigh Aurora recommendations for care of feet until upcoming appointment.

## 2021-04-28 DIAGNOSIS — H524 Presbyopia: Secondary | ICD-10-CM | POA: Diagnosis not present

## 2021-04-28 DIAGNOSIS — H52203 Unspecified astigmatism, bilateral: Secondary | ICD-10-CM | POA: Diagnosis not present

## 2021-04-28 DIAGNOSIS — H5213 Myopia, bilateral: Secondary | ICD-10-CM | POA: Diagnosis not present

## 2021-04-28 DIAGNOSIS — H25813 Combined forms of age-related cataract, bilateral: Secondary | ICD-10-CM | POA: Diagnosis not present

## 2021-04-30 ENCOUNTER — Ambulatory Visit: Payer: Medicare HMO

## 2021-05-01 ENCOUNTER — Ambulatory Visit (INDEPENDENT_AMBULATORY_CARE_PROVIDER_SITE_OTHER): Payer: Medicare HMO | Admitting: *Deleted

## 2021-05-01 ENCOUNTER — Other Ambulatory Visit: Payer: Self-pay

## 2021-05-01 ENCOUNTER — Ambulatory Visit: Payer: Medicare HMO | Admitting: Podiatry

## 2021-05-01 DIAGNOSIS — M722 Plantar fascial fibromatosis: Secondary | ICD-10-CM

## 2021-05-01 DIAGNOSIS — J4550 Severe persistent asthma, uncomplicated: Secondary | ICD-10-CM

## 2021-05-01 DIAGNOSIS — J4540 Moderate persistent asthma, uncomplicated: Secondary | ICD-10-CM

## 2021-05-01 DIAGNOSIS — R252 Cramp and spasm: Secondary | ICD-10-CM | POA: Diagnosis not present

## 2021-05-01 NOTE — Patient Instructions (Addendum)
Look at getting Powerstep, Superfeet, or Aetrex Inserts   You can use "urea nail gel" on the toenail  Plantar Fasciitis (Heel Spur Syndrome) with Rehab The plantar fascia is a fibrous, ligament-like, soft-tissue structure that spans the bottom of the foot. Plantar fasciitis is a condition that causes pain in the foot due to inflammation of the tissue. SYMPTOMS  Pain and tenderness on the underneath side of the foot. Pain that worsens with standing or walking. CAUSES  Plantar fasciitis is caused by irritation and injury to the plantar fascia on the underneath side of the foot. Common mechanisms of injury include: Direct trauma to bottom of the foot. Damage to a small nerve that runs under the foot where the main fascia attaches to the heel bone. Stress placed on the plantar fascia due to bone spurs. RISK INCREASES WITH:  Activities that place stress on the plantar fascia (running, jumping, pivoting, or cutting). Poor strength and flexibility. Improperly fitted shoes. Tight calf muscles. Flat feet. Failure to warm-up properly before activity. Obesity. PREVENTION Warm up and stretch properly before activity. Allow for adequate recovery between workouts. Maintain physical fitness: Strength, flexibility, and endurance. Cardiovascular fitness. Maintain a health body weight. Avoid stress on the plantar fascia. Wear properly fitted shoes, including arch supports for individuals who have flat feet.  PROGNOSIS  If treated properly, then the symptoms of plantar fasciitis usually resolve without surgery. However, occasionally surgery is necessary.  RELATED COMPLICATIONS  Recurrent symptoms that may result in a chronic condition. Problems of the lower back that are caused by compensating for the injury, such as limping. Pain or weakness of the foot during push-off following surgery. Chronic inflammation, scarring, and partial or complete fascia tear, occurring more often from repeated  injections.  TREATMENT  Treatment initially involves the use of ice and medication to help reduce pain and inflammation. The use of strengthening and stretching exercises may help reduce pain with activity, especially stretches of the Achilles tendon. These exercises may be performed at home or with a therapist. Your caregiver may recommend that you use heel cups of arch supports to help reduce stress on the plantar fascia. Occasionally, corticosteroid injections are given to reduce inflammation. If symptoms persist for greater than 6 months despite non-surgical (conservative), then surgery may be recommended.   MEDICATION  If pain medication is necessary, then nonsteroidal anti-inflammatory medications, such as aspirin and ibuprofen, or other minor pain relievers, such as acetaminophen, are often recommended. Do not take pain medication within 7 days before surgery. Prescription pain relievers may be given if deemed necessary by your caregiver. Use only as directed and only as much as you need. Corticosteroid injections may be given by your caregiver. These injections should be reserved for the most serious cases, because they may only be given a certain number of times.  HEAT AND COLD Cold treatment (icing) relieves pain and reduces inflammation. Cold treatment should be applied for 10 to 15 minutes every 2 to 3 hours for inflammation and pain and immediately after any activity that aggravates your symptoms. Use ice packs or massage the area with a piece of ice (ice massage). Heat treatment may be used prior to performing the stretching and strengthening activities prescribed by your caregiver, physical therapist, or athletic trainer. Use a heat pack or soak the injury in warm water.  SEEK IMMEDIATE MEDICAL CARE IF: Treatment seems to offer no benefit, or the condition worsens. Any medications produce adverse side effects.  EXERCISES- RANGE OF MOTION (ROM) AND STRETCHING  EXERCISES - Plantar  Fasciitis (Heel Spur Syndrome) These exercises may help you when beginning to rehabilitate your injury. Your symptoms may resolve with or without further involvement from your physician, physical therapist or athletic trainer. While completing these exercises, remember:  Restoring tissue flexibility helps normal motion to return to the joints. This allows healthier, less painful movement and activity. An effective stretch should be held for at least 30 seconds. A stretch should never be painful. You should only feel a gentle lengthening or release in the stretched tissue.  RANGE OF MOTION - Toe Extension, Flexion Sit with your right / left leg crossed over your opposite knee. Grasp your toes and gently pull them back toward the top of your foot. You should feel a stretch on the bottom of your toes and/or foot. Hold this stretch for 10 seconds. Now, gently pull your toes toward the bottom of your foot. You should feel a stretch on the top of your toes and or foot. Hold this stretch for 10 seconds. Repeat  times. Complete this stretch 3 times per day.   RANGE OF MOTION - Ankle Dorsiflexion, Active Assisted Remove shoes and sit on a chair that is preferably not on a carpeted surface. Place right / left foot under knee. Extend your opposite leg for support. Keeping your heel down, slide your right / left foot back toward the chair until you feel a stretch at your ankle or calf. If you do not feel a stretch, slide your bottom forward to the edge of the chair, while still keeping your heel down. Hold this stretch for 10 seconds. Repeat 3 times. Complete this stretch 2 times per day.   STRETCH  Gastroc, Standing Place hands on wall. Extend right / left leg, keeping the front knee somewhat bent. Slightly point your toes inward on your back foot. Keeping your right / left heel on the floor and your knee straight, shift your weight toward the wall, not allowing your back to arch. You should feel a  gentle stretch in the right / left calf. Hold this position for 10 seconds. Repeat 3 times. Complete this stretch 2 times per day.  STRETCH  Soleus, Standing Place hands on wall. Extend right / left leg, keeping the other knee somewhat bent. Slightly point your toes inward on your back foot. Keep your right / left heel on the floor, bend your back knee, and slightly shift your weight over the back leg so that you feel a gentle stretch deep in your back calf. Hold this position for 10 seconds. Repeat 3 times. Complete this stretch 2 times per day.  STRETCH  Gastrocsoleus, Standing  Note: This exercise can place a lot of stress on your foot and ankle. Please complete this exercise only if specifically instructed by your caregiver.  Place the ball of your right / left foot on a step, keeping your other foot firmly on the same step. Hold on to the wall or a rail for balance. Slowly lift your other foot, allowing your body weight to press your heel down over the edge of the step. You should feel a stretch in your right / left calf. Hold this position for 10 seconds. Repeat this exercise with a slight bend in your right / left knee. Repeat 3 times. Complete this stretch 2 times per day.   STRENGTHENING EXERCISES - Plantar Fasciitis (Heel Spur Syndrome)  These exercises may help you when beginning to rehabilitate your injury. They may resolve your symptoms with  or without further involvement from your physician, physical therapist or athletic trainer. While completing these exercises, remember:  Muscles can gain both the endurance and the strength needed for everyday activities through controlled exercises. Complete these exercises as instructed by your physician, physical therapist or athletic trainer. Progress the resistance and repetitions only as guided.  STRENGTH - Towel Curls Sit in a chair positioned on a non-carpeted surface. Place your foot on a towel, keeping your heel on the  floor. Pull the towel toward your heel by only curling your toes. Keep your heel on the floor. Repeat 3 times. Complete this exercise 2 times per day.  STRENGTH - Ankle Inversion Secure one end of a rubber exercise band/tubing to a fixed object (table, pole). Loop the other end around your foot just before your toes. Place your fists between your knees. This will focus your strengthening at your ankle. Slowly, pull your big toe up and in, making sure the band/tubing is positioned to resist the entire motion. Hold this position for 10 seconds. Have your muscles resist the band/tubing as it slowly pulls your foot back to the starting position. Repeat 3 times. Complete this exercises 2 times per day.  Document Released: 07/05/2005 Document Revised: 09/27/2011 Document Reviewed: 10/17/2008 [facility] Patient Information 2014 [place], [place].

## 2021-05-01 NOTE — Progress Notes (Signed)
Subjective: 70 year old female presents the office today with concerns of cramping bilateral lower extremities from the feet, and lower legs.  She states that she gets it mostly with sleeping.  She does get after prolonged walking but she is able to walk quite a bit of distance.  No increase in swelling.  No open sores.  No recent injury or falls.  Objective: AAO x3, NAD DP/PT pulses palpable bilaterally, CRT less than 3 seconds There is mild chronic lower extremity edema present.  There is no pain with calf compression, erythema or warmth in the calf is supple bilaterally.  No pain on exam today.  There is no open lesions.  Flexor, extensor tendons appear to be intact.  Assessment: Leg cramps, swelling  Plan: -All treatment options discussed with the patient including all alternatives, risks, complications.  -Prescription for compression socks were given.  Encouraged elevation, limit salt intake.  Discussed general stretching exercises that can be performed as well to help with the cramping particularly in the plantar arch of the foot on the plantar fascia as well as the calf. -Patient encouraged to call the office with any questions, concerns, change in symptoms.   Trula Slade DPM

## 2021-05-13 DIAGNOSIS — H2512 Age-related nuclear cataract, left eye: Secondary | ICD-10-CM | POA: Diagnosis not present

## 2021-05-13 DIAGNOSIS — H25011 Cortical age-related cataract, right eye: Secondary | ICD-10-CM | POA: Diagnosis not present

## 2021-05-13 DIAGNOSIS — H2511 Age-related nuclear cataract, right eye: Secondary | ICD-10-CM | POA: Diagnosis not present

## 2021-05-26 ENCOUNTER — Ambulatory Visit: Payer: Medicare HMO

## 2021-05-27 ENCOUNTER — Ambulatory Visit: Payer: Medicare HMO

## 2021-05-27 DIAGNOSIS — H25011 Cortical age-related cataract, right eye: Secondary | ICD-10-CM | POA: Diagnosis not present

## 2021-05-27 DIAGNOSIS — H2511 Age-related nuclear cataract, right eye: Secondary | ICD-10-CM | POA: Diagnosis not present

## 2021-05-28 ENCOUNTER — Ambulatory Visit: Payer: Medicare HMO

## 2021-06-04 ENCOUNTER — Telehealth: Payer: Self-pay | Admitting: *Deleted

## 2021-06-04 NOTE — Telephone Encounter (Signed)
Attempted to call patient to let her know her Cheron Every is in to get her scheduled to come in to get her injection. Was unable to reach her or leave a voicemail. Will need to call again.

## 2021-06-04 NOTE — Telephone Encounter (Signed)
Called and spoke with the patient and she is scheduled to come in tomorrow to get her Tezspire injection.

## 2021-06-05 ENCOUNTER — Ambulatory Visit (INDEPENDENT_AMBULATORY_CARE_PROVIDER_SITE_OTHER): Payer: Medicare HMO

## 2021-06-05 ENCOUNTER — Other Ambulatory Visit: Payer: Self-pay

## 2021-06-05 DIAGNOSIS — J4540 Moderate persistent asthma, uncomplicated: Secondary | ICD-10-CM

## 2021-06-05 DIAGNOSIS — J4550 Severe persistent asthma, uncomplicated: Secondary | ICD-10-CM | POA: Diagnosis not present

## 2021-06-23 DIAGNOSIS — H524 Presbyopia: Secondary | ICD-10-CM | POA: Diagnosis not present

## 2021-06-23 DIAGNOSIS — Z0100 Encounter for examination of eyes and vision without abnormal findings: Secondary | ICD-10-CM | POA: Diagnosis not present

## 2021-07-01 ENCOUNTER — Other Ambulatory Visit: Payer: Self-pay | Admitting: Allergy

## 2021-07-06 ENCOUNTER — Ambulatory Visit: Payer: Medicare HMO

## 2021-07-06 IMAGING — MG DIGITAL SCREENING BILAT W/ TOMO W/ CAD
6 of 10 series · 6 of 30 positions shown · non-contrast
Comparison: Previous exam(s).

CLINICAL DATA: Screening.

EXAM:
DIGITAL SCREENING BILATERAL MAMMOGRAM WITH TOMO AND CAD

[L CC synth-2D (1 of 2)]
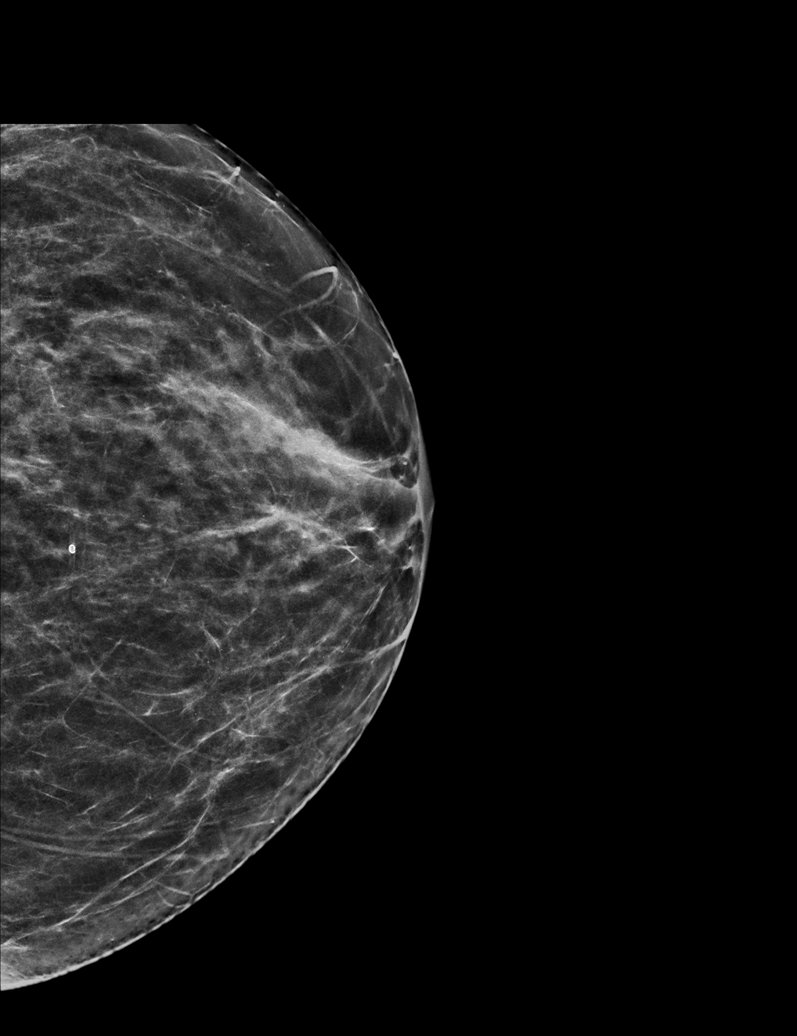

[R MLO synth-2D]
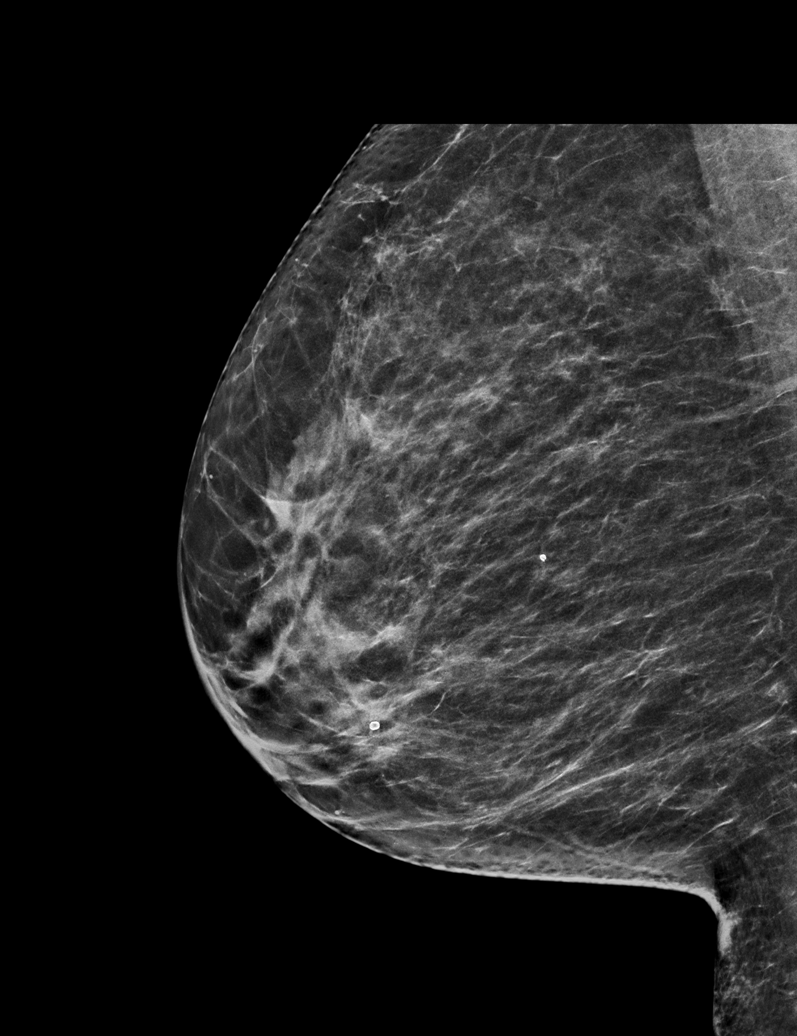

[R CC synth-2D]
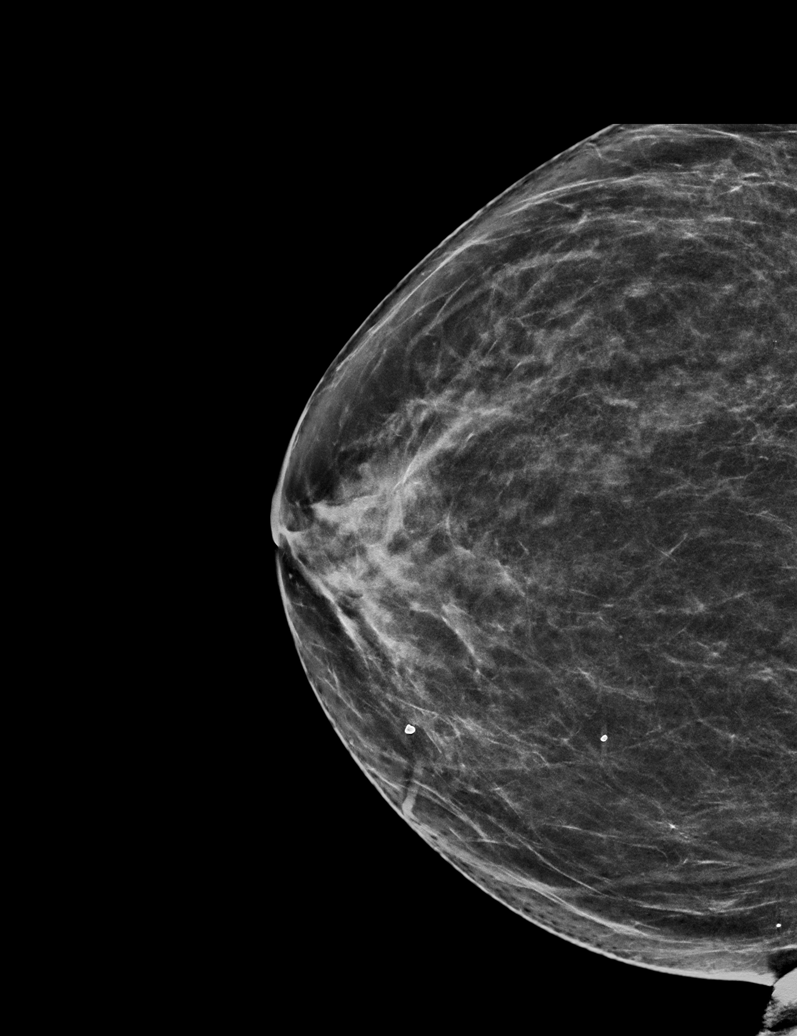

[L CC synth-2D (2 of 2)]
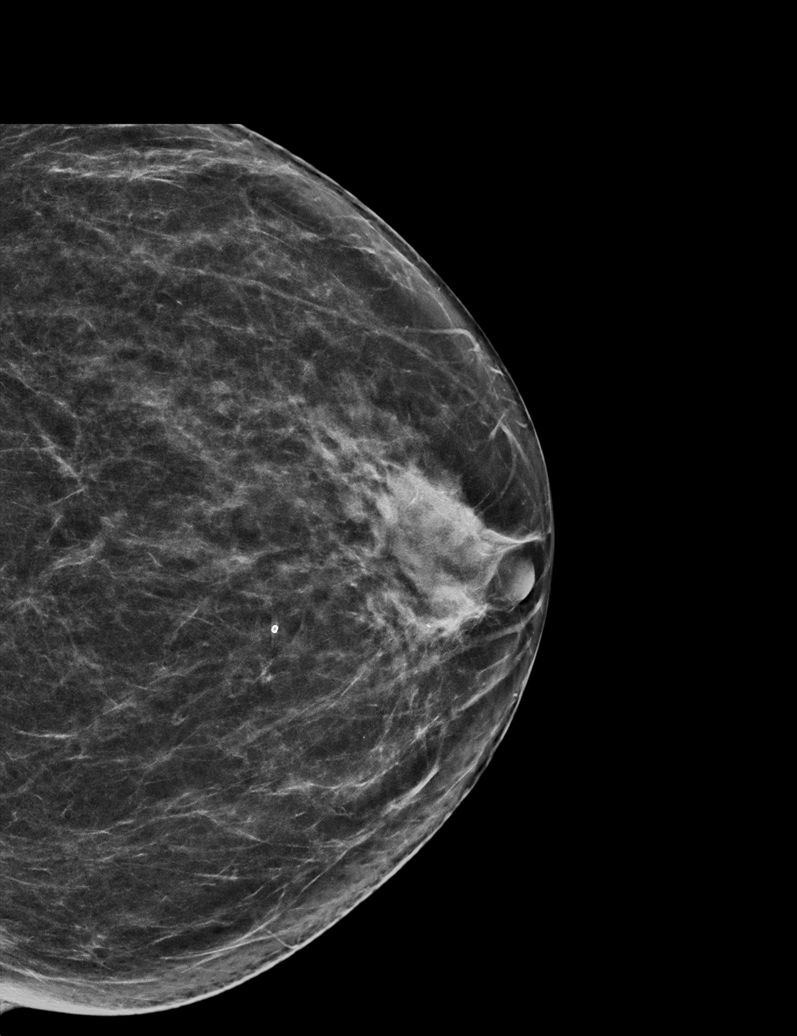

[L MLO synth-2D]
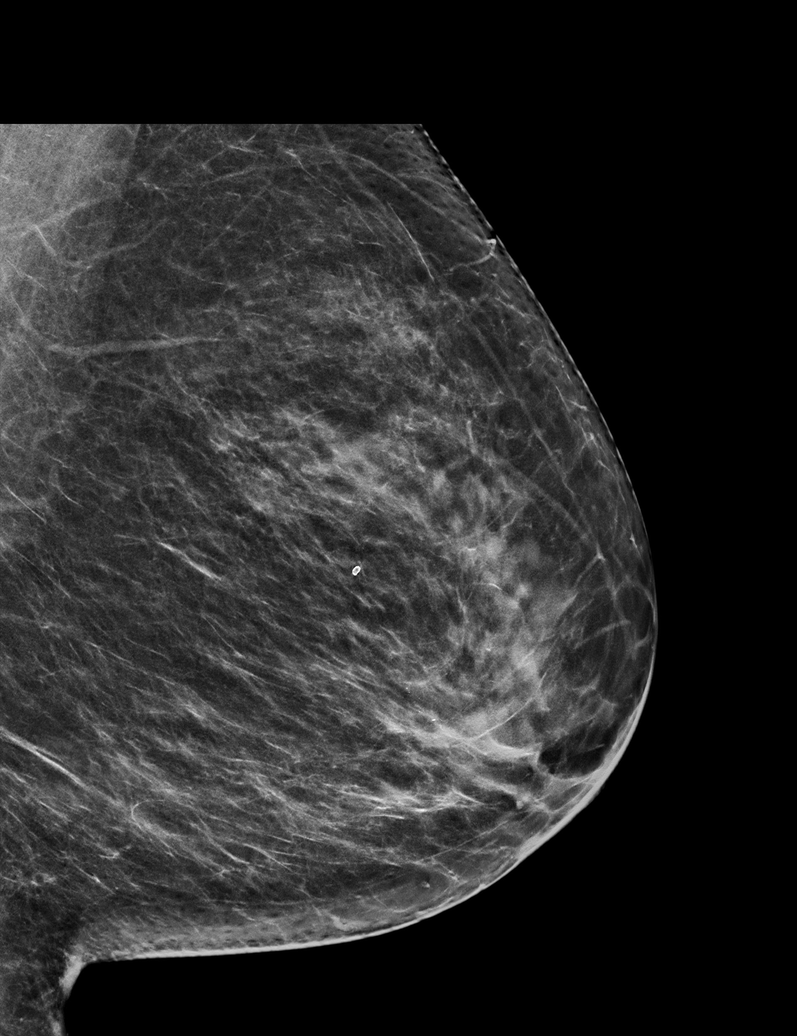

[R MLO tomo · tomo slice 33/65.0]
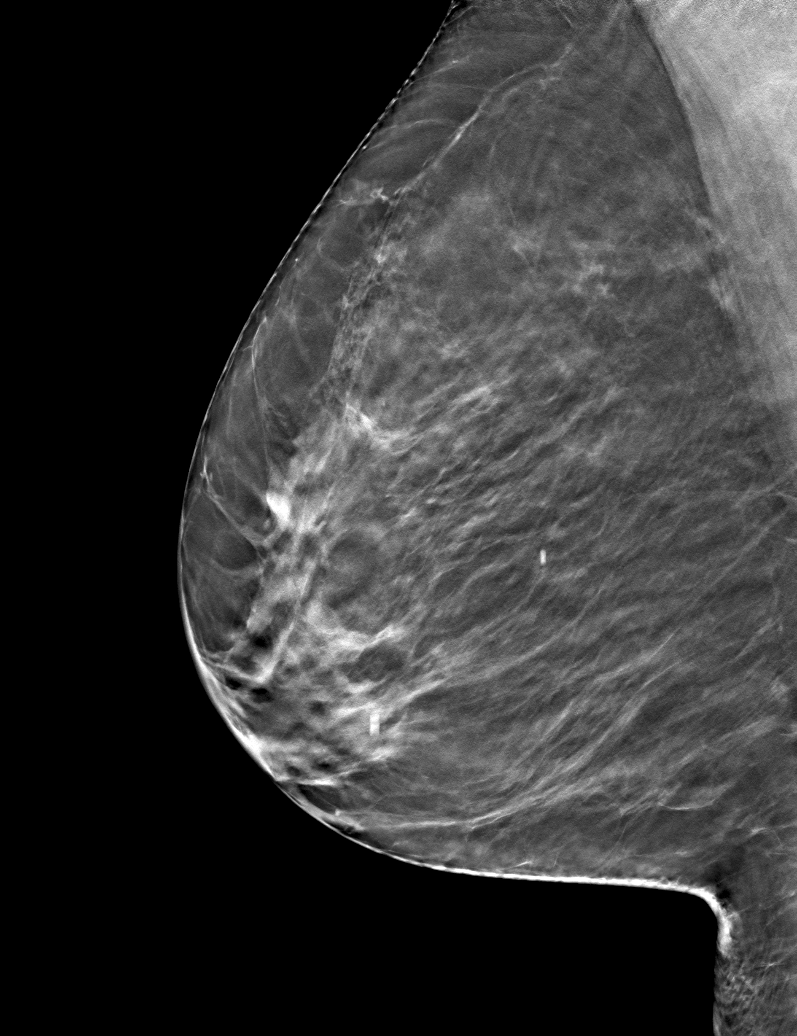

[6 of 30 positions shown; findings below may reference images not displayed]

ACR Breast Density Category c: The breast tissue is heterogeneously
dense, which may obscure small masses.
FINDINGS: There are no findings suspicious for malignancy. Images were
processed with CAD.
IMPRESSION: No mammographic evidence of malignancy. A result letter of this
screening mammogram will be mailed directly to the patient.

RECOMMENDATION:
Screening mammogram in one year. (Code:[id number])

BI-RADS CATEGORY  1: Negative.

## 2021-07-14 ENCOUNTER — Other Ambulatory Visit: Payer: Self-pay

## 2021-07-14 ENCOUNTER — Ambulatory Visit (INDEPENDENT_AMBULATORY_CARE_PROVIDER_SITE_OTHER): Payer: Medicare HMO | Admitting: *Deleted

## 2021-07-14 DIAGNOSIS — J4540 Moderate persistent asthma, uncomplicated: Secondary | ICD-10-CM | POA: Diagnosis not present

## 2021-08-03 ENCOUNTER — Telehealth: Payer: Self-pay | Admitting: *Deleted

## 2021-08-03 NOTE — Telephone Encounter (Signed)
Called patient, no answer, left voice message of Dr Leigh Aurora recommendations.

## 2021-08-03 NOTE — Telephone Encounter (Signed)
Patient is coming in on 23 rd  for a f/u appointment, wanted to know if the physician can remove the toenail that's been bothering her. Had discussed her circulation not being very good but now the toe is painful. Please advise.

## 2021-08-10 ENCOUNTER — Ambulatory Visit: Payer: Medicare HMO | Admitting: Podiatry

## 2021-08-10 ENCOUNTER — Ambulatory Visit: Payer: Medicare HMO

## 2021-08-11 ENCOUNTER — Ambulatory Visit (INDEPENDENT_AMBULATORY_CARE_PROVIDER_SITE_OTHER): Payer: Medicare HMO

## 2021-08-11 ENCOUNTER — Other Ambulatory Visit: Payer: Self-pay

## 2021-08-11 ENCOUNTER — Ambulatory Visit: Payer: Medicare HMO | Admitting: Podiatry

## 2021-08-11 ENCOUNTER — Ambulatory Visit: Payer: Medicare HMO

## 2021-08-11 ENCOUNTER — Encounter: Payer: Self-pay | Admitting: Podiatry

## 2021-08-11 DIAGNOSIS — J4540 Moderate persistent asthma, uncomplicated: Secondary | ICD-10-CM | POA: Diagnosis not present

## 2021-08-11 DIAGNOSIS — B351 Tinea unguium: Secondary | ICD-10-CM | POA: Diagnosis not present

## 2021-08-11 NOTE — Progress Notes (Signed)
Subjective: 71 year old female presents the office today for concerns of fungus on her toes particularly right second total nail.  She said the nails gotten long and curving underneath causing discomfort.  No swelling or redness or any drainage.  No recent treatment.  She has no other concerns.  Objective: AAO x3, NAD DP/PT pulses palpable bilaterally, CRT less than 3 seconds In particular her right second nails hypertrophic, dystrophic with brown discoloration.  The nail is curving towards the distal portion of the toe causing discomfort.  There is no edema, erythema, drainage or pus or any signs of infection noted.  There is no open lesions. No open lesions or pre-ulcerative lesions.  No pain with calf compression, swelling, warmth, erythema  Assessment: Onychomycosis, right second toenail pain  Plan: -All treatment options discussed with the patient including all alternatives, risks, complications.  -Sharply debrided the toenails and complications or bleeding.  Continue topical antifungal. Discussed use of urea nail gel.  If needed consider nail removal in the future. -Patient encouraged to call the office with any questions, concerns, change in symptoms.   Lorenda Peck DPM

## 2021-09-03 ENCOUNTER — Ambulatory Visit: Payer: Medicare HMO | Admitting: Allergy

## 2021-09-03 ENCOUNTER — Encounter: Payer: Self-pay | Admitting: Allergy

## 2021-09-03 ENCOUNTER — Other Ambulatory Visit: Payer: Self-pay

## 2021-09-03 VITALS — BP 120/80 | HR 90 | Temp 97.8°F | Resp 16 | Ht 60.0 in | Wt 153.0 lb

## 2021-09-03 DIAGNOSIS — R053 Chronic cough: Secondary | ICD-10-CM

## 2021-09-03 DIAGNOSIS — K219 Gastro-esophageal reflux disease without esophagitis: Secondary | ICD-10-CM | POA: Diagnosis not present

## 2021-09-03 DIAGNOSIS — J300 Vasomotor rhinitis: Secondary | ICD-10-CM | POA: Diagnosis not present

## 2021-09-03 DIAGNOSIS — J4540 Moderate persistent asthma, uncomplicated: Secondary | ICD-10-CM

## 2021-09-03 DIAGNOSIS — J3089 Other allergic rhinitis: Secondary | ICD-10-CM | POA: Diagnosis not present

## 2021-09-03 NOTE — Patient Instructions (Addendum)
Moderate persistent asthma Daily controller medication(s):  Breztri 153mcg 2 puffs twice a day.  Rinse mouth after use.    Tezspire monthly injections.  Continue montelukast 10mg  daily.  Prior to physical activity: May use albuterol rescue inhaler 2 puffs 5 to 15 minutes prior to strenuous physical activities. Rescue medications: May use albuterol rescue inhaler 2 puffs or nebulizer every 4 to 6 hours as needed for shortness of breath, chest tightness, coughing, and wheezing. Monitor frequency of use.   Asthma control goals:  Full participation in all desired activities (may need albuterol before activity) Albuterol use two times or less a week on average (not counting use with activity) Cough interfering with sleep two times or less a month Oral steroids no more than once a year No hospitalizations   Perennial allergic rhinitis with a vasomotor rhinitis Continue environmental control measures. Continue montelukast 10mg  daily. Continue fluticasone nasal spray 1-2 spray per nostril once a day for nasal congestion. Continue nasal Atrovent 2 sprays each nostril up to 3-4 times a day as needed for runny nose/nasal drainage.   If nose gets too dry stop use and can use nasal saline spray Nasal saline spray (i.e., Simply Saline) or nasal saline lavage (i.e., NeilMed) is recommended as needed and prior to medicated nasal sprays.  GERD (gastroesophageal reflux disease) Continue appropriate reflux lifestyle modifications.  Omeprazole has not been effective  Continue Pantoprazole 1 tab daily.  Continue Famotidine 20 mg twice daily. Cut out caffeine.  Change to decaf coffee   Cough Improved with above asthma regimen Follow up with Dr. Benjamine Mola (ENT). Continue Gabapentin 300mg  2 times a day as prescribed by Dr. Benjamine Mola.   Follow up in 6 months or sooner if needed.

## 2021-09-03 NOTE — Progress Notes (Signed)
Follow-up Note  RE: [name] MRN: [id number] DOB: July 20, 1950 Date of Office Visit: 09/03/2021   History of present illness: [name] is a 71 y.o. female presenting today for follow-up of asthma, allergic rhinitis, GERD.  She also has history of chronic cough with neurogenic component as has responded to gabapentin therapy managed by Dr. Benjamine Mola.    She feels her asthma has been doing "a lot better".  She has had to use albuterol about 3-4 times a month mostly with the weather changes.  Denies nighttime awakenings.  She continues on [employer].  She continues to use Breztri 2 puffs twice a day and singulair daily.  She has not required systemic steroids or ED/UC visits since last visit.    She states her allergy symptoms are doing well too but does not some morning drainage.  She also works and at her job she states sometimes when they get newspaper or printed items it can make her drainage worse.  She states a coworker was speculating that the ink was causing the issue.    Her dexilant was changed to pantoprazole.   She continues to take gabapentin as well.  Review of systems: Review of Systems  Constitutional: Negative.   HENT:  Positive for postnasal drip.   Eyes: Negative.   Respiratory: Negative.    Cardiovascular: Negative.   Gastrointestinal: Negative.   Musculoskeletal: Negative.   Skin: Negative.   Allergic/Immunologic: Negative.   Neurological: Negative.     All other systems negative unless noted above in HPI  Past medical/social/surgical/family history have been reviewed and are unchanged unless specifically indicated below.  No changes  Medication List: Current Outpatient Medications  Medication Sig Dispense Refill   albuterol (VENTOLIN HFA) 108 (90 Base) MCG/ACT inhaler INHALE 2 PUFFS BY MOUTH EVERY 6 HOURS AS NEEDED FOR SHORTNESS OF BREATH OR WHEEZING. 18 g 2   azelastine (ASTELIN) 0.1 % nasal spray SPRAY 1 TO 2 SPRAYS PER NOSTRIL TWICE DAILY. 30  mL 3   BREZTRI AEROSPHERE 160-9-4.8 MCG/ACT AERO Take 2 puffs by mouth in the morning and at bedtime. 10.7 g 5   cimetidine (TAGAMET) 800 MG tablet TAKE 1/2 TABLET BY MOUTH ONCE OR TWICE DAILY AS NEEDED 30 tablet 2   EPINEPHrine 0.3 mg/0.3 mL IJ SOAJ injection Inject 0.3 mg into the muscle as needed for anaphylaxis. 2 each 1   FLUoxetine (PROZAC) 20 MG capsule Take 1 capsule by mouth daily.     fluticasone (FLONASE) 50 MCG/ACT nasal spray Place 2 sprays into both nostrils 2 (two) times daily. 16 g 5   ipratropium (ATROVENT) 0.06 % nasal spray USE 2 SPRAYS IN EACH NOSTRIL THREE TIMES A DAY AS NEEDED. 15 mL 2   montelukast (SINGULAIR) 10 MG tablet TAKE ONE TABLET BY MOUTH AT BEDTIME. 30 tablet 5   nitrofurantoin (MACRODANTIN) 100 MG capsule Take 100 mg by mouth at bedtime.     NON FORMULARY [facility]  Antifungal (nail)-#1     NON FORMULARY [facility]  Anti-fungal (nail)-#1     omeprazole (PRILOSEC) 20 MG capsule Take 1 capsule (20 mg total) by mouth daily. 30 capsule 3   Respiratory Therapy Supplies (FLUTTER) DEVI Use as directed 1 each 3   SF 5000 PLUS 1.1 % CREA dental cream USE AS DIRECTED TO BRUSH TEETH AT BEDTIME     Spacer/Aero-Holding Chambers (AEROCHAMBER PLUS WITH MASK) inhaler 1 each by Other route See admin instructions. Use with inhaler as instructed. 1 each 1  triamcinolone cream (KENALOG) 0.1 %      dexlansoprazole (DEXILANT) 60 MG capsule Take 1 capsule (60 mg total) by mouth daily. (Patient not taking: Reported on 09/03/2021) 30 capsule 5   gabapentin (NEURONTIN) 300 MG capsule      Current Facility-Administered Medications  Medication Dose Route Frequency Provider Last Rate Last Admin   tezepelumab-ekko (TEZSPIRE) 210 MG/1.91ML syringe 210 mg  210 mg Subcutaneous Q28 days Kennith Gain, MD   210 mg at 08/11/21 1022     Known medication allergies: Allergies  Allergen Reactions   Sulfa Antibiotics Other (See Comments)    Unknown- pt unsure  of reaction; believes it may be nausea   Cephalosporins Other (See Comments)    Other Reaction: Toxicity     Physical examination: Blood pressure 120/80, pulse 90, temperature 97.8 F (36.6 C), resp. rate 16, height 5' (1.524 m), weight 153 lb (69.4 kg), SpO2 97 %.  General: Alert, interactive, in no acute distress. HEENT: PERRLA, TMs pearly gray, turbinates non-edematous without discharge, post-pharynx non erythematous. Neck: Supple without lymphadenopathy. Lungs: Clear to auscultation without wheezing, rhonchi or rales. {no increased work of breathing. CV: Normal S1, S2 without murmurs. Abdomen: Nondistended, nontender. Skin: Warm and dry, without lesions or rashes. Extremities:  No clubbing, cyanosis or edema. Neuro:   Grossly intact.  Diagnositics/Labs:  Spirometry: FEV1: 1.24L 66%, FVC: 1.72L 69% predicted.  This is relatively stable study for her  Assessment and plan:   Moderate persistent asthma Daily controller medication(s):  Breztri 152mcg 2 puffs twice a day.  Rinse mouth after use.    Tezspire monthly injections.  Continue montelukast 10mg  daily.  Prior to physical activity: May use albuterol rescue inhaler 2 puffs 5 to 15 minutes prior to strenuous physical activities. Rescue medications: May use albuterol rescue inhaler 2 puffs or nebulizer every 4 to 6 hours as needed for shortness of breath, chest tightness, coughing, and wheezing. Monitor frequency of use.   Asthma control goals:  Full participation in all desired activities (may need albuterol before activity) Albuterol use two times or less a week on average (not counting use with activity) Cough interfering with sleep two times or less a month Oral steroids no more than once a year No hospitalizations   Perennial allergic rhinitis with a vasomotor rhinitis Continue environmental control measures. Continue montelukast 10mg  daily. Continue fluticasone nasal spray 1-2 spray per nostril once a day for nasal  congestion. Continue nasal Atrovent 2 sprays each nostril up to 3-4 times a day as needed for runny nose/nasal drainage.   If nose gets too dry stop use and can use nasal saline spray Nasal saline spray (i.e., Simply Saline) or nasal saline lavage (i.e., NeilMed) is recommended as needed and prior to medicated nasal sprays.  GERD (gastroesophageal reflux disease) Continue appropriate reflux lifestyle modifications.  Omeprazole has not been effective  Continue Pantoprazole 1 tab daily.  Continue Famotidine 20 mg twice daily. Cut out caffeine.  Change to decaf coffee   Cough Improved with above asthma regimen Follow up with Dr. Benjamine Mola (ENT). Continue Gabapentin 300mg  2 times a day as prescribed by Dr. Benjamine Mola.   Follow up in 6 months or sooner if needed.   I appreciate the opportunity to take part in Kierstan's care. Please do not hesitate to contact me with questions.  Sincerely,   Prudy Feeler, MD Allergy/Immunology [facility]

## 2021-09-07 DIAGNOSIS — Z79899 Other long term (current) drug therapy: Secondary | ICD-10-CM | POA: Diagnosis not present

## 2021-09-07 DIAGNOSIS — J45909 Unspecified asthma, uncomplicated: Secondary | ICD-10-CM | POA: Diagnosis not present

## 2021-09-07 DIAGNOSIS — F329 Major depressive disorder, single episode, unspecified: Secondary | ICD-10-CM | POA: Diagnosis not present

## 2021-09-07 DIAGNOSIS — K219 Gastro-esophageal reflux disease without esophagitis: Secondary | ICD-10-CM | POA: Diagnosis not present

## 2021-09-08 ENCOUNTER — Ambulatory Visit (INDEPENDENT_AMBULATORY_CARE_PROVIDER_SITE_OTHER): Payer: Medicare HMO

## 2021-09-08 ENCOUNTER — Other Ambulatory Visit: Payer: Self-pay

## 2021-09-08 DIAGNOSIS — J4550 Severe persistent asthma, uncomplicated: Secondary | ICD-10-CM | POA: Diagnosis not present

## 2021-09-11 ENCOUNTER — Telehealth: Payer: Self-pay | Admitting: *Deleted

## 2021-09-11 NOTE — Telephone Encounter (Signed)
Patient wanted to let the physician know that the antifungal medication prescribed has not helped, will call [facility] for something different.

## 2021-09-14 ENCOUNTER — Other Ambulatory Visit: Payer: Self-pay | Admitting: Podiatry

## 2021-09-14 MED ORDER — CICLOPIROX 8 % EX SOLN
Freq: Every day | CUTANEOUS | 0 refills | Status: DC
Start: 2021-09-14 — End: 2022-01-25

## 2021-09-14 NOTE — Telephone Encounter (Signed)
Patient has been notified

## 2021-09-16 DIAGNOSIS — F3340 Major depressive disorder, recurrent, in remission, unspecified: Secondary | ICD-10-CM | POA: Diagnosis not present

## 2021-09-16 DIAGNOSIS — D696 Thrombocytopenia, unspecified: Secondary | ICD-10-CM | POA: Diagnosis not present

## 2021-09-16 DIAGNOSIS — J4540 Moderate persistent asthma, uncomplicated: Secondary | ICD-10-CM | POA: Diagnosis not present

## 2021-09-24 DIAGNOSIS — Z961 Presence of intraocular lens: Secondary | ICD-10-CM | POA: Diagnosis not present

## 2021-10-02 ENCOUNTER — Other Ambulatory Visit: Payer: Self-pay | Admitting: Allergy

## 2021-10-06 ENCOUNTER — Ambulatory Visit (INDEPENDENT_AMBULATORY_CARE_PROVIDER_SITE_OTHER): Payer: Medicare HMO

## 2021-10-06 ENCOUNTER — Other Ambulatory Visit: Payer: Self-pay

## 2021-10-06 DIAGNOSIS — J4550 Severe persistent asthma, uncomplicated: Secondary | ICD-10-CM

## 2021-10-19 DIAGNOSIS — D696 Thrombocytopenia, unspecified: Secondary | ICD-10-CM | POA: Diagnosis not present

## 2021-10-31 ENCOUNTER — Ambulatory Visit (INDEPENDENT_AMBULATORY_CARE_PROVIDER_SITE_OTHER): Payer: Medicare HMO

## 2021-10-31 ENCOUNTER — Ambulatory Visit
Admission: EM | Admit: 2021-10-31 | Discharge: 2021-10-31 | Disposition: A | Discharge status: Home / Self Care | Payer: Medicare HMO | Attending: Urgent Care | Admitting: Urgent Care

## 2021-10-31 DIAGNOSIS — R053 Chronic cough: Secondary | ICD-10-CM | POA: Diagnosis not present

## 2021-10-31 DIAGNOSIS — M542 Cervicalgia: Secondary | ICD-10-CM | POA: Diagnosis not present

## 2021-10-31 DIAGNOSIS — M5010 Cervical disc disorder with radiculopathy, unspecified cervical region: Secondary | ICD-10-CM

## 2021-10-31 DIAGNOSIS — M47812 Spondylosis without myelopathy or radiculopathy, cervical region: Secondary | ICD-10-CM | POA: Diagnosis not present

## 2021-10-31 DIAGNOSIS — I509 Heart failure, unspecified: Secondary | ICD-10-CM | POA: Diagnosis not present

## 2021-10-31 DIAGNOSIS — M4802 Spinal stenosis, cervical region: Secondary | ICD-10-CM

## 2021-10-31 MED ORDER — BENZONATATE 100 MG PO CAPS: 100.0000 mg | ORAL_CAPSULE | Freq: Three times a day (TID) | ORAL | 0 refills | Status: DC | PRN

## 2021-10-31 MED ORDER — PREDNISONE 20 MG PO TABS: ORAL_TABLET | ORAL | 0 refills | Status: DC

## 2021-10-31 MED ORDER — CETIRIZINE HCL 5 MG PO TABS
5.0000 mg | ORAL_TABLET | Freq: Every day | ORAL | 0 refills | Status: AC
Start: 2021-10-31 — End: ?

## 2021-10-31 NOTE — ED Provider Notes (Signed)
?[place] ? ? ?MRN: [id number] DOB: 27-Aug-1950 ? ?Subjective:  ? ?Karen Karen is a 71 y.o. female presenting for 1 week history of persistent neck pain, neck stiffness, pain radiates to the right arm.  Patient has also had difficulty with a persistent acute on chronic cough that is eliciting lower chest wall pain.  Cough is nonproductive, no shortness of breath or dyspnea.  She does have a pulmonologist.  No falls, trauma.  Patient has a history of perennial allergic rhinitis, moderate persistent asthma.  She does have a remote history of congestive heart failure, chart review shows that this was designated more than 10 years ago.  She does not have to see a cardiologist. ? ? ?Current Facility-Administered Medications:  ?  tezepelumab-ekko (TEZSPIRE) 210 MG/1.91ML syringe 210 mg, 210 mg, Subcutaneous, Q28 days, Kennith Gain, MD, 210 mg at 10/06/21 5852 ? ?Current Outpatient Medications:  ?  albuterol (VENTOLIN HFA) 108 (90 Base) MCG/ACT inhaler, INHALE 2 PUFFS BY MOUTH EVERY 6 HOURS AS NEEDED FOR SHORTNESS OF BREATH OR WHEEZING., Disp: 18 g, Rfl: 2 ?  azelastine (ASTELIN) 0.1 % nasal spray, SPRAY 1 TO 2 SPRAYS PER NOSTRIL TWICE DAILY., Disp: 30 mL, Rfl: 3 ?  BREZTRI AEROSPHERE 160-9-4.8 MCG/ACT AERO, INHALE 2 PUFFS BY MOUTH TWICE DAILY, Disp: 10.7 g, Rfl: 11 ?  ciclopirox (PENLAC) 8 % solution, Apply topically at bedtime. Apply over nail and surrounding skin. Apply daily over previous coat. After seven (7) days, may remove with alcohol and continue cycle., Disp: 6.6 mL, Rfl: 0 ?  cimetidine (TAGAMET) 800 MG tablet, TAKE 1/2 TABLET BY MOUTH ONCE OR TWICE DAILY AS NEEDED, Disp: 30 tablet, Rfl: 2 ?  dexlansoprazole (DEXILANT) 60 MG capsule, Take 1 capsule (60 mg total) by mouth daily. (Patient not taking: Reported on 09/03/2021), Disp: 30 capsule, Rfl: 5 ?  EPINEPHrine 0.3 mg/0.3 mL IJ SOAJ injection, Inject 0.3 mg into the muscle as needed for anaphylaxis., Disp: 2 each, Rfl: 1 ?   FLUoxetine (PROZAC) 20 MG capsule, Take 1 capsule by mouth daily., Disp: , Rfl:  ?  fluticasone (FLONASE) 50 MCG/ACT nasal spray, Place 2 sprays into both nostrils 2 (two) times daily., Disp: 16 g, Rfl: 5 ?  gabapentin (NEURONTIN) 300 MG capsule, , Disp: , Rfl:  ?  ipratropium (ATROVENT) 0.06 % nasal spray, USE 2 SPRAYS IN EACH NOSTRIL THREE TIMES A DAY AS NEEDED., Disp: 15 mL, Rfl: 11 ?  montelukast (SINGULAIR) 10 MG tablet, TAKE ONE TABLET BY MOUTH AT BEDTIME., Disp: 30 tablet, Rfl: 5 ?  nitrofurantoin (MACRODANTIN) 100 MG capsule, Take 100 mg by mouth at bedtime., Disp: , Rfl:  ?  NON FORMULARY, [facility]  Antifungal (nail)-#1, Disp: , Rfl:  ?  NON FORMULARY, [facility]  Anti-fungal (nail)-#1, Disp: , Rfl:  ?  omeprazole (PRILOSEC) 20 MG capsule, Take 1 capsule (20 mg total) by mouth daily., Disp: 30 capsule, Rfl: 3 ?  Respiratory Therapy Supplies (FLUTTER) DEVI, Use as directed, Disp: 1 each, Rfl: 3 ?  SF 5000 PLUS 1.1 % CREA dental cream, USE AS DIRECTED TO BRUSH TEETH AT BEDTIME, Disp: , Rfl:  ?  Spacer/Aero-Holding Chambers (AEROCHAMBER PLUS WITH MASK) inhaler, 1 each by Other route See admin instructions. Use with inhaler as instructed., Disp: 1 each, Rfl: 1 ?  triamcinolone cream (KENALOG) 0.1 %, , Disp: , Rfl:   ? ?Allergies  ?Allergen Reactions  ? Sulfa Antibiotics Other (See Comments)  ?  Unknown- pt unsure of reaction; believes  it may be nausea  ? Cephalosporins Other (See Comments)  ?  Other Reaction: Toxicity  ? ? ?Past Medical History:  ?Diagnosis Date  ? Anxiety   ? Asthma   ? had 1 episode 1 year ago-no more problem  ? CHF (congestive heart failure) ([name])   ? swelling of feet & ankles  ? Cough   ? Depression   ? OCD  ? GERD (gastroesophageal reflux disease)   ? severe  ? OCD (obsessive compulsive disorder)   ? Shortness of breath   ? with exertion  ? Yeast infection 04/09/2014  ?  ? ?Past Surgical History:  ?Procedure Laterality Date  ? CARPAL TUNNEL RELEASE  03/22/2012  ?  Procedure: CARPAL TUNNEL RELEASE;  Surgeon: Wynonia Sours, MD;  Location: [facility];  Service: Orthopedics;  Laterality: Right;  right carpal tunnel release  ? COLONOSCOPY  06/19/2012  ? Dr. Rourk:normal rectum and colon   ? ESOPHAGOGASTRODUODENOSCOPY N/A 12/26/2015  ? Dr. Gala Romney: normal   ? TONSILLECTOMY    ? TUBAL LIGATION    ? ? ?Family History  ?Problem Relation Age of Onset  ? Asthma Mother   ? Macular degeneration Mother   ? Hypertension Mother   ? Alzheimer's disease Father   ? Other Sister   ?     blood clots  ? Other Son   ?     enlarged spleen  ? Diabetes Maternal Grandfather   ? Diabetes Paternal Grandfather   ? Colon cancer Neg Hx   ? ? ?Social History  ? ?Tobacco Use  ? Smoking status: Never  ?  Passive exposure: Yes  ? Smokeless tobacco: Never  ?Vaping Use  ? Vaping Use: Never used  ?Substance Use Topics  ? Alcohol use: No  ? Drug use: No  ? ? ?ROS ? ? ?Objective:  ? ?Vitals: ?BP 121/73   Pulse 79   Temp 99.3 ?F (37.4 ?C)   Resp 18   SpO2 95%  ? ?Physical Exam ?Constitutional:   ?   General: She is not in acute distress. ?   Appearance: Normal appearance. She is well-developed. She is not ill-appearing, toxic-appearing or diaphoretic.  ?HENT:  ?   Head: Normocephalic and atraumatic.  ?   Nose: Nose normal.  ?   Mouth/Throat:  ?   Mouth: Mucous membranes are moist.  ?Eyes:  ?   General: No scleral icterus.    ?   Right eye: No discharge.     ?   Left eye: No discharge.  ?   Extraocular Movements: Extraocular movements intact.  ?Cardiovascular:  ?   Rate and Rhythm: Normal rate.  ?   Heart sounds: No murmur heard. ?  No friction rub. No gallop.  ?Pulmonary:  ?   Effort: Pulmonary effort is normal. No respiratory distress.  ?   Breath sounds: No stridor. No wheezing, rhonchi or rales.  ?Chest:  ?   Chest wall: No tenderness.  ?Musculoskeletal:  ?   Cervical back: Spasms and tenderness present. No swelling, edema, deformity, erythema, signs of trauma, lacerations, rigidity, torticollis, bony  tenderness or crepitus. Pain with movement present. Normal range of motion.  ?   Comments: Positive Spurling maneuver to the right.  Negative Lhermitte sign.  ?Skin: ?   General: Skin is warm and dry.  ?Neurological:  ?   General: No focal deficit present.  ?   Mental Status: She is alert and oriented to person, place, and time.  ?Psychiatric:     ?  Mood and Affect: Mood normal.     ?   Behavior: Behavior normal.  ? ? ?DG Cervical Spine Complete ? ?Result Date: 10/31/2021 ?CLINICAL DATA:  Chronic neck pain radiating down the right arm. EXAM: CERVICAL SPINE - COMPLETE 4+ VIEW COMPARISON:  None. FINDINGS: The lateral view is diagnostic to the C6-C7 level. There is no acute fracture or subluxation. Vertebral body heights are preserved. Straightening of the normal cervical lordosis. No significant listhesis. Mild disc height loss with moderate left and mild right facet uncovertebral hypertrophy from C3-C4 through C6-C7. Mild left neuroforaminal stenosis at C3-C4 and C4-C5. No bony right neuroforaminal stenosis. Normal prevertebral soft tissues. IMPRESSION: 1. Mild-to-moderate multilevel cervical spondylosis as described above. Electronically Signed   By: Titus Dubin M.D.   On: 10/31/2021 13:46   ? ? ?Assessment and Plan :  ? ?PDMP not reviewed this encounter. ? ?1. Cervical disc disorder with radiculopathy of cervical region   ?2. Cervical spondylosis   ?3. Foraminal stenosis of cervical region   ?4. Chronic cough   ? ?Patient has a clear cardiopulmonary exam and therefore will defer chest x-ray.  Recommend supportive care, follow-up with her pulmonologist.  Cervical imaging showed multilevel cervical spondylosis, neuroforaminal stenosis.  This is consistent with her symptoms and therefore I recommended strong steroid course to help temper her symptoms.  Follow-up with neurologist, spinal surgeon as soon as possible.  I do expect that the steroid will help her with her chronic cough as well. Counseled patient on  potential for adverse effects with medications prescribed/recommended today, ER and return-to-clinic precautions discussed, patient verbalized understanding. ? ?  ?Jaynee Eagles, PA-C ?10/31/21 1356 ? ?

## 2021-10-31 NOTE — ED Triage Notes (Signed)
Pt presents with neck pain that radiates down right arm , was seen by pcp and given robaxin  with no relief  ?

## 2021-11-03 ENCOUNTER — Ambulatory Visit: Payer: Medicare HMO

## 2021-11-04 ENCOUNTER — Telehealth: Payer: Self-pay

## 2021-11-04 NOTE — Telephone Encounter (Signed)
Patient called to check on her appointment time for her [name]. She wanted to know if there was any available times for her to get her shot today, but informed patient there was no room. Let patient know the day and time of her next scheduled Immunotherapy appointment. ?

## 2021-11-06 ENCOUNTER — Ambulatory Visit (INDEPENDENT_AMBULATORY_CARE_PROVIDER_SITE_OTHER): Payer: Medicare HMO

## 2021-11-06 DIAGNOSIS — J4550 Severe persistent asthma, uncomplicated: Secondary | ICD-10-CM

## 2021-12-04 ENCOUNTER — Ambulatory Visit: Payer: Medicare HMO

## 2021-12-07 ENCOUNTER — Ambulatory Visit (INDEPENDENT_AMBULATORY_CARE_PROVIDER_SITE_OTHER): Payer: Medicare HMO

## 2021-12-07 DIAGNOSIS — J4550 Severe persistent asthma, uncomplicated: Secondary | ICD-10-CM

## 2021-12-28 DIAGNOSIS — M47812 Spondylosis without myelopathy or radiculopathy, cervical region: Secondary | ICD-10-CM | POA: Diagnosis not present

## 2021-12-28 DIAGNOSIS — Z6827 Body mass index (BMI) 27.0-27.9, adult: Secondary | ICD-10-CM | POA: Diagnosis not present

## 2022-01-04 ENCOUNTER — Ambulatory Visit (INDEPENDENT_AMBULATORY_CARE_PROVIDER_SITE_OTHER): Payer: Medicare HMO

## 2022-01-04 DIAGNOSIS — J4550 Severe persistent asthma, uncomplicated: Secondary | ICD-10-CM | POA: Diagnosis not present

## 2022-01-25 ENCOUNTER — Ambulatory Visit: Payer: Medicare HMO | Admitting: Podiatry

## 2022-01-25 DIAGNOSIS — B351 Tinea unguium: Secondary | ICD-10-CM

## 2022-01-25 DIAGNOSIS — M79675 Pain in left toe(s): Secondary | ICD-10-CM | POA: Diagnosis not present

## 2022-01-25 DIAGNOSIS — M79674 Pain in right toe(s): Secondary | ICD-10-CM | POA: Diagnosis not present

## 2022-01-25 MED ORDER — CICLOPIROX 8 % EX SOLN
Freq: Every day | CUTANEOUS | 2 refills | Status: DC
Start: 2022-01-25 — End: 2022-07-26

## 2022-01-26 NOTE — Progress Notes (Signed)
Subjective: 71 year old female presents the office today for concerns of fungus on her toes particularly right second total nail.  She states that she was using a topical medication that was helping however she stopped using it and the fungus came back.  She still has tenderness mostly on the right second toe nail but she does get tenderness on the nails as well.  No swelling redness or any drainage.  No open lesions.  No other concerns.   Objective: AAO x3, NAD DP/PT pulses palpable bilaterally, CRT less than 3 seconds Nails are hypertrophic, dystrophic and discolored with incurvation of the nail borders.  In particular the right second nail is hypertrophic and dystrophic and is tender with trimming the nail.  There is no edema, erythema or signs of infection. No open lesions or pre-ulcerative lesions.  No pain with calf compression, swelling, warmth, erythema  Assessment: Onychomycosis, right second toenail pain  Plan: -All treatment options discussed with the patient including all alternatives, risks, complications.  -Sharply debrided the toenails and complications or bleeding.  Continue topical antifungal.  Refill of ciclopirox. -Consider right second toenail removal which we discussed the procedure as well as postoperative course today. -Patient encouraged to call the office with any questions, concerns, change in symptoms.   Lorenda Peck DPM

## 2022-01-28 ENCOUNTER — Encounter (HOSPITAL_COMMUNITY): Payer: Self-pay | Admitting: Physical Therapy

## 2022-01-28 ENCOUNTER — Ambulatory Visit (HOSPITAL_COMMUNITY): Payer: Medicare HMO | Attending: Neurosurgery | Admitting: Physical Therapy

## 2022-01-28 DIAGNOSIS — R293 Abnormal posture: Secondary | ICD-10-CM | POA: Diagnosis not present

## 2022-01-28 DIAGNOSIS — M542 Cervicalgia: Secondary | ICD-10-CM | POA: Diagnosis not present

## 2022-01-28 NOTE — Therapy (Signed)
OUTPATIENT PHYSICAL THERAPY CERVICAL EVALUATION   Patient Name: [name] MRN: [id number] DOB:Nov 25, 1950, 71 y.o., female Today's Date: 01/28/2022   PT End of Session - 01/28/22 0856     Visit Number 1    Number of Visits 12    Date for PT Re-Evaluation 03/11/22    Authorization Type [employer]    Authorization Time Period Check auth    Authorization - Visit Number 1    Authorization - Number of Visits 1    Progress Note Due on Visit 10    PT Start Time 0816    PT Stop Time 0856    PT Time Calculation (min) 40 min    Activity Tolerance Patient tolerated treatment well;Patient limited by pain    Behavior During Therapy [facility] for tasks assessed/performed             Past Medical History:  Diagnosis Date   Anxiety    Asthma    had 1 episode 1 year ago-no more problem   CHF (congestive heart failure) (HCC)    swelling of feet & ankles   Cough    Depression    OCD   GERD (gastroesophageal reflux disease)    severe   OCD (obsessive compulsive disorder)    Shortness of breath    with exertion   Yeast infection 04/09/2014   Past Surgical History:  Procedure Laterality Date   CARPAL TUNNEL RELEASE  03/22/2012   Procedure: CARPAL TUNNEL RELEASE;  Surgeon: Wynonia Sours, MD;  Location: [place];  Service: Orthopedics;  Laterality: Right;  right carpal tunnel release   COLONOSCOPY  06/19/2012   Dr. Rourk:normal rectum and colon    ESOPHAGOGASTRODUODENOSCOPY N/A 12/26/2015   Dr. Gala Romney: normal    TONSILLECTOMY     TUBAL LIGATION     Patient Active Problem List   Diagnosis Date Noted   Hoarseness 03/14/2018   Perennial allergic rhinitis with a possible nonallergic component 10/04/2017   Moderate persistent asthma 10/04/2017   Oropharyngeal dysphagia 05/06/2017   Gastroesophageal reflux disease without esophagitis    Constipation 11/26/2015   Yeast infection 04/09/2014   Obesity (BMI 30-39.9) 01/31/2014   Cough, persistent    CHF (congestive heart failure)  ([place])    Depression    Anxiety    Encounter for screening colonoscopy 05/28/2012   GERD (gastroesophageal reflux disease) 05/28/2012   Low back pain 06/17/2011    PCP: Asencion Noble MD  REFERRING PROVIDER: Vallarie Mare, MD   REFERRING DIAG: [phone] Cervical Spondylosis   THERAPY DIAG:  Cervicalgia  Abnormal posture  Rationale for Evaluation and Treatment Rehabilitation  ONSET DATE: Chronic  SUBJECTIVE:  SUBJECTIVE STATEMENT: Patient presents to therapy with complaint of acute neck pain. She has been told her neck is deteriorating. She states pain began about 2-3 days ago with no MOI. She thought at first it was "the crick". She had recent xrays which showed spondylosis. She is taking muscle relaxer which helps sometimes but pain has gotten progressively worse.   PERTINENT HISTORY:  NA  PAIN:  Are you having pain? Yes: NPRS scale: 9/10 Pain location: base of neck, Lt shoulder  Pain description: sharp, shooting Aggravating factors: Unsure Relieving factors: rest, meds   PRECAUTIONS: None  WEIGHT BEARING RESTRICTIONS No  FALLS:  Has patient fallen in last 6 months? No  LIVING ENVIRONMENT: Lives with: lives alone Lives in: House/apartment Stairs: Yes: External: 3 steps; can reach both and bilateral but cannot reach both Has following equipment at home: Single point cane  OCCUPATION: Greeter at [employer]   PLOF: [place] Get rid of pain   OBJECTIVE:   DIAGNOSTIC FINDINGS:  IMPRESSION: 1. Mild-to-moderate multilevel cervical spondylosis as described above.    PATIENT SURVEYS:  FOTO 37% function    COGNITION: Overall cognitive status: Within functional limits for tasks assessed   SENSATION: WFL  POSTURE: forward  head  PALPATION: Max TTP / hyper sensitivity to LT upper trap/ scapular area    CERVICAL ROM:   Active ROM A/PROM (deg) eval  Flexion WFL  Extension 5  Right lateral flexion   Left lateral flexion   Right rotation 48  Left rotation 55   (Blank rows = not tested)  UPPER EXTREMITY ROM:  Min restriction in bilat shoulder flexion and IR due to pain   UPPER EXTREMITY MMT:  MMT Right eval Left eval  Shoulder flexion 3- 3-  Shoulder extension    Shoulder abduction 3- 3-  Shoulder adduction    Shoulder extension    Shoulder internal rotation 3+ 3+  Shoulder external rotation 3+ 3+  Middle trapezius    Lower trapezius    Elbow flexion    Elbow extension    Wrist flexion    Wrist extension    Wrist ulnar deviation    Wrist radial deviation    Wrist pronation    Wrist supination    Grip strength     (Blank rows = not tested)    TODAY'S TREATMENT:  01/28/22 Eval Chin tuck Scap retraction Cervical AROM   PATIENT EDUCATION:  Education details: on eval findings, POC and HEP Person educated: Patient Education method: Explanation Education comprehension: verbalized understanding   HOME EXERCISE PROGRAM: Access Code: [email or web address] URL: [email or web address] Date: 01/28/2022 Prepared by: Josue Hector  Exercises - Seated Cervical Retraction  - 3 x daily - 7 x weekly - 1 sets - 10 reps - 3-5 second hold - Seated Scapular Retraction  - 3 x daily - 7 x weekly - 1 sets - 10 reps - 3-5 second hold - Seated Cervical Rotation AROM  - 3 x daily - 7 x weekly - 1 sets - 10 reps - 3-5 second hold  ASSESSMENT:  CLINICAL IMPRESSION: Patient is a 71 y.o. female who presents to physical therapy with complaint of neck pain. Patient demonstrates decreased strength, ROM restriction, reduced flexibility, increased tenderness to palpation and postural abnormalities which are likely contributing to symptoms of pain and are negatively impacting patient ability to perform  ADLs. Patient will benefit from skilled physical therapy services to address these deficits to reduce pain and improve level of function with ADLs  OBJECTIVE IMPAIRMENTS decreased activity tolerance, decreased ROM, decreased strength, increased fascial restrictions, impaired flexibility, impaired UE functional use, improper body mechanics, postural dysfunction, and pain.   ACTIVITY LIMITATIONS carrying, lifting, sleeping, reach over head, and hygiene/grooming  PARTICIPATION LIMITATIONS: meal prep, cleaning, laundry, driving, shopping, community activity, occupation, and yard work  PERSONAL FACTORS  None  are also affecting patient's functional outcome.   REHAB POTENTIAL: Good  CLINICAL DECISION MAKING: Stable/uncomplicated  EVALUATION COMPLEXITY: Low   GOALS: SHORT TERM GOALS: Target date: 02/18/2022  Patient will be independent with initial HEP and self-management strategies to improve functional outcomes Baseline:  Goal status: INITIAL    LONG TERM GOALS: Target date: 03/11/2022  Patient will be independent with advanced HEP and self-management strategies to improve functional outcomes Baseline:  Goal status: INITIAL  2.  Patient will improve FOTO score to predicted value to indicate improvement in functional outcomes Baseline: 37% function Goal status: INITIAL  3.  Patient will demo improved cervical extension by at least 10 degrees in order to improve ability to scan environment for safety and perform [place] ADLS.  Baseline: See AROM Goal status: INITIAL  4. Patient will report a decrease in neck pain to 0/10 at rest for improved quality of life and ability to perform UE ADLs  Baseline: 9/10 Goal status: INITIAL   PLAN: PT FREQUENCY: 2x/week  PT DURATION: 6 weeks  PLANNED INTERVENTIONS: Therapeutic exercises, Therapeutic activity, Neuromuscular re-education, Balance training, Gait training, Patient/Family education, Joint manipulation, Joint mobilization, Stair  training, Aquatic Therapy, Dry Needling, Electrical stimulation, Spinal manipulation, Spinal mobilization, Cryotherapy, Moist heat, scar mobilization, Taping, Traction, Ultrasound, Biofeedback, Ionotophoresis '4mg'$ /ml Dexamethasone, and Manual therapy.   PLAN FOR NEXT SESSION: Progress pain free cervical mobility and postural strengthening as tolerated. Manual STM as needed for pain and restriction when tolerated.   8:57 AM, 01/28/22 Josue Hector PT DPT  Physical Therapist with [facility]  [phone]

## 2022-01-29 ENCOUNTER — Ambulatory Visit (HOSPITAL_COMMUNITY): Payer: Medicare HMO | Admitting: Physical Therapy

## 2022-02-01 ENCOUNTER — Ambulatory Visit (INDEPENDENT_AMBULATORY_CARE_PROVIDER_SITE_OTHER): Payer: Medicare HMO

## 2022-02-01 ENCOUNTER — Ambulatory Visit (HOSPITAL_COMMUNITY): Payer: Medicare HMO | Admitting: Physical Therapy

## 2022-02-01 DIAGNOSIS — J4550 Severe persistent asthma, uncomplicated: Secondary | ICD-10-CM

## 2022-02-01 DIAGNOSIS — R293 Abnormal posture: Secondary | ICD-10-CM

## 2022-02-01 DIAGNOSIS — M542 Cervicalgia: Secondary | ICD-10-CM

## 2022-02-01 NOTE — Therapy (Signed)
OUTPATIENT PHYSICAL THERAPY TREATMENT  Patient Name: [name] MRN: [id number] DOB:Dec 06, 1950, 71 y.o., female Today's Date: 02/01/2022   PT End of Session - 02/01/22 1615     Visit Number 2    Number of Visits 12    Date for PT Re-Evaluation 03/11/22    Authorization Type [employer]    Authorization Time Period 12 visits approved 7/13-8/24    Authorization - Visit Number 2    Authorization - Number of Visits 12    Progress Note Due on Visit 10    PT Start Time 4259    PT Stop Time 5638    PT Time Calculation (min) 40 min    Activity Tolerance Patient tolerated treatment well;Patient limited by pain    Behavior During Therapy WFL for tasks assessed/performed             Past Medical History:  Diagnosis Date   Anxiety    Asthma    had 1 episode 1 year ago-no more problem   CHF (congestive heart failure) (HCC)    swelling of feet & ankles   Cough    Depression    OCD   GERD (gastroesophageal reflux disease)    severe   OCD (obsessive compulsive disorder)    Shortness of breath    with exertion   Yeast infection 04/09/2014   Past Surgical History:  Procedure Laterality Date   CARPAL TUNNEL RELEASE  03/22/2012   Procedure: CARPAL TUNNEL RELEASE;  Surgeon: Wynonia Sours, MD;  Location: [place];  Service: Orthopedics;  Laterality: Right;  right carpal tunnel release   COLONOSCOPY  06/19/2012   Dr. Rourk:normal rectum and colon    ESOPHAGOGASTRODUODENOSCOPY N/A 12/26/2015   Dr. Gala Romney: normal    TONSILLECTOMY     TUBAL LIGATION     Patient Active Problem List   Diagnosis Date Noted   Hoarseness 03/14/2018   Perennial allergic rhinitis with a possible nonallergic component 10/04/2017   Moderate persistent asthma 10/04/2017   Oropharyngeal dysphagia 05/06/2017   Gastroesophageal reflux disease without esophagitis    Constipation 11/26/2015   Yeast infection 04/09/2014   Obesity (BMI 30-39.9) 01/31/2014   Cough, persistent    CHF (congestive heart  failure) ([place])    Depression    Anxiety    Encounter for screening colonoscopy 05/28/2012   GERD (gastroesophageal reflux disease) 05/28/2012   Low back pain 06/17/2011    PCP: Asencion Noble MD  REFERRING PROVIDER: Vallarie Mare, MD   REFERRING DIAG: [phone] Cervical Spondylosis   THERAPY DIAG:  Cervicalgia  Abnormal posture  Rationale for Evaluation and Treatment Rehabilitation  ONSET DATE: Chronic  SUBJECTIVE:  SUBJECTIVE STATEMENT: Patient presents to therapy reporting overall improvement from evaluation.  States she is taking a mm relaxer and is able to move her head better.  Currently without pain but still having some popping with some motions.   PERTINENT HISTORY:   Cervical Pain  PAIN:  Are you having pain? Yes: NPRS scale: 0/10 Pain location: base of neck, Lt shoulder  Pain description: sharp, shooting Aggravating factors: Unsure Relieving factors: rest, meds   PRECAUTIONS: None  WEIGHT BEARING RESTRICTIONS No  FALLS:  Has patient fallen in last 6 months? No  LIVING ENVIRONMENT: Lives with: lives alone Lives in: House/apartment Stairs: Yes: External: 3 steps; can reach both and bilateral but cannot reach both Has following equipment at home: Single point cane  OCCUPATION: Greeter at [employer]   PLOF: [place] Get rid of pain   OBJECTIVE:   DIAGNOSTIC FINDINGS:  IMPRESSION: 1. Mild-to-moderate multilevel cervical spondylosis as described above.    PATIENT SURVEYS:  FOTO 37% function    COGNITION: Overall cognitive status: Within functional limits for tasks assessed   SENSATION: WFL  POSTURE: forward head  PALPATION: Max TTP / hyper sensitivity to LT upper trap/ scapular area    CERVICAL ROM:    Active ROM A/PROM (deg) eval  Flexion WFL  Extension 5  Right lateral flexion   Left lateral flexion   Right rotation 48  Left rotation 55   (Blank rows = not tested)  UPPER EXTREMITY ROM:  Min restriction in bilat shoulder flexion and IR due to pain   UPPER EXTREMITY MMT:  MMT Right eval Left eval  Shoulder flexion 3- 3-  Shoulder extension    Shoulder abduction 3- 3-  Shoulder adduction    Shoulder extension    Shoulder internal rotation 3+ 3+  Shoulder external rotation 3+ 3+  Middle trapezius    Lower trapezius    Elbow flexion    Elbow extension    Wrist flexion    Wrist extension    Wrist ulnar deviation    Wrist radial deviation    Wrist pronation    Wrist supination    Grip strength     (Blank rows = not tested)    TODAY'S TREATMENT:  02/01/22: Goal review Seated:  Chin tucks 10X  Scap retractions 10X5"   3D cervical ROM  UE bil shoulder flexion 10X  Shoulder abduction 10X  Shoulder ER/IR 3# each LE 10X   Bicep curls 3# 10X  Hammer curls 3# 10X each Manual:  STM to bil upper traps  01/28/22 Eval Chin tuck Scap retraction Cervical AROM   PATIENT EDUCATION:  Education details: on eval findings, POC and HEP Person educated: Patient Education method: Explanation Education comprehension: verbalized understanding   HOME EXERCISE PROGRAM: 02/01/22: 3D cervical excursions 10X each  Access Code: [email or web address] URL: [email or web address] Date: 01/28/2022 Prepared by: Josue Hector  Exercises - Seated Cervical Retraction  - 3 x daily - 7 x weekly - 1 sets - 10 reps - 3-5 second hold - Seated Scapular Retraction  - 3 x daily - 7 x weekly - 1 sets - 10 reps - 3-5 second hold - Seated Cervical Rotation AROM  - 3 x daily - 7 x weekly - 1 sets - 10 reps - 3-5 second hold  ASSESSMENT:  CLINICAL IMPRESSION: Reviewed goals and POC moving forward.  Pt able to recall HEP without cues needed. Progressed UE strengthening with addition of  flexion, abduction, IR/ER with weights and curls.  Pt with minimal cues needed for form and posturing.   Pt overall improving with less guarded motion and less pain.  Began manual with light touch initially and progressed to slightly deeper to reduce tightness.  Lt Upper trap with more tightness/spasm than Rt.  Pt reported overall improvement at end of session.   Patient will benefit from skilled physical therapy services to address deficits, reduce pain and improve level of function with ADLs   OBJECTIVE IMPAIRMENTS decreased activity tolerance, decreased ROM, decreased strength, increased fascial restrictions, impaired flexibility, impaired UE functional use, improper body mechanics, postural dysfunction, and pain.   ACTIVITY LIMITATIONS carrying, lifting, sleeping, reach over head, and hygiene/grooming  PARTICIPATION LIMITATIONS: meal prep, cleaning, laundry, driving, shopping, community activity, occupation, and yard work  PERSONAL FACTORS  None  are also affecting patient's functional outcome.   REHAB POTENTIAL: Good  CLINICAL DECISION MAKING: Stable/uncomplicated  EVALUATION COMPLEXITY: Low   GOALS: SHORT TERM GOALS: Target date: 02/18/2022  Patient will be independent with initial HEP and self-management strategies to improve functional outcomes Baseline:  Goal status: MET    LONG TERM GOALS: Target date: 03/15/2022  Patient will be independent with advanced HEP and self-management strategies to improve functional outcomes Baseline:  Goal status: IN PROGRESS  2.  Patient will improve FOTO score to predicted value to indicate improvement in functional outcomes Baseline: 37% function Goal status: IN PROGRESS  3.  Patient will demo improved cervical extension by at least 10 degrees in order to improve ability to scan environment for safety and perform [place] ADLS.  Baseline: See AROM Goal status: IN PROGRESS  4. Patient will report a decrease in neck pain to 0/10 at rest for  improved quality of life and ability to perform UE ADLs  Baseline: 9/10 Goal status: IN PROGRESS   PLAN: PT FREQUENCY: 2x/week  PT DURATION: 6 weeks  PLANNED INTERVENTIONS: Therapeutic exercises, Therapeutic activity, Neuromuscular re-education, Balance training, Gait training, Patient/Family education, Joint manipulation, Joint mobilization, Stair training, Aquatic Therapy, Dry Needling, Electrical stimulation, Spinal manipulation, Spinal mobilization, Cryotherapy, Moist heat, scar mobilization, Taping, Traction, Ultrasound, Biofeedback, Ionotophoresis 63m/ml Dexamethasone, and Manual therapy.   PLAN FOR NEXT SESSION: Progress pain free cervical mobility and postural strengthening as tolerated. Manual STM as needed for pain.   4:16 PM, 02/01/22 ATeena Irani PTA/CLT CAlbrightPh: [phone]

## 2022-02-03 ENCOUNTER — Ambulatory Visit (HOSPITAL_COMMUNITY): Payer: Medicare HMO | Admitting: Physical Therapy

## 2022-02-03 DIAGNOSIS — R293 Abnormal posture: Secondary | ICD-10-CM | POA: Diagnosis not present

## 2022-02-03 DIAGNOSIS — M542 Cervicalgia: Secondary | ICD-10-CM | POA: Diagnosis not present

## 2022-02-03 NOTE — Therapy (Signed)
OUTPATIENT PHYSICAL THERAPY TREATMENT  Patient Name: [name] MRN: [id number] DOB:1951/04/15, 71 y.o., female Today's Date: 02/03/2022   PT End of Session - 02/03/22 0824     Visit Number 3    Number of Visits 12    Date for PT Re-Evaluation 03/11/22    Authorization Type [employer]    Authorization Time Period 12 visits approved 7/13-8/24    Authorization - Visit Number 3    Authorization - Number of Visits 12    Progress Note Due on Visit 10    PT Start Time 0817    PT Stop Time 0857    PT Time Calculation (min) 40 min    Activity Tolerance Patient tolerated treatment well;Patient limited by pain    Behavior During Therapy [facility] for tasks assessed/performed             Past Medical History:  Diagnosis Date   Anxiety    Asthma    had 1 episode 1 year ago-no more problem   CHF (congestive heart failure) (HCC)    swelling of feet & ankles   Cough    Depression    OCD   GERD (gastroesophageal reflux disease)    severe   OCD (obsessive compulsive disorder)    Shortness of breath    with exertion   Yeast infection 04/09/2014   Past Surgical History:  Procedure Laterality Date   CARPAL TUNNEL RELEASE  03/22/2012   Procedure: CARPAL TUNNEL RELEASE;  Surgeon: Wynonia Sours, MD;  Location: [place];  Service: Orthopedics;  Laterality: Right;  right carpal tunnel release   COLONOSCOPY  06/19/2012   Dr. Rourk:normal rectum and colon    ESOPHAGOGASTRODUODENOSCOPY N/A 12/26/2015   Dr. Gala Romney: normal    TONSILLECTOMY     TUBAL LIGATION     Patient Active Problem List   Diagnosis Date Noted   Hoarseness 03/14/2018   Perennial allergic rhinitis with a possible nonallergic component 10/04/2017   Moderate persistent asthma 10/04/2017   Oropharyngeal dysphagia 05/06/2017   Gastroesophageal reflux disease without esophagitis    Constipation 11/26/2015   Yeast infection 04/09/2014   Obesity (BMI 30-39.9) 01/31/2014   Cough, persistent    CHF (congestive heart  failure) ([place])    Depression    Anxiety    Encounter for screening colonoscopy 05/28/2012   GERD (gastroesophageal reflux disease) 05/28/2012   Low back pain 06/17/2011    PCP: Asencion Noble MD  REFERRING PROVIDER: Vallarie Mare, MD   REFERRING DIAG: [phone] Cervical Spondylosis   THERAPY DIAG:  Cervicalgia  Abnormal posture  Rationale for Evaluation and Treatment Rehabilitation  ONSET DATE: Chronic  SUBJECTIVE:  SUBJECTIVE STATEMENT: Patient reports she's having more pain running down her LE's.  States she's been more aware of her posture.  Reports felt great after therapy but the pain returned in cervical area and into shoulders bilaterally.  Current pain is 5/10.   PERTINENT HISTORY:   Cervical Pain  PAIN:  Are you having pain? Yes: NPRS scale: 5/10 Pain location: base of neck, bil shoulder  Pain description: sharp, shooting Aggravating factors: Unsure Relieving factors: rest, meds   PRECAUTIONS: None  WEIGHT BEARING RESTRICTIONS No  FALLS:  Has patient fallen in last 6 months? 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 Mild-to-moderate multilevel cervical spondylosis as described above.    PATIENT SURVEYS:  FOTO 37% function    COGNITION: Overall cognitive status: Within functional limits for tasks assessed   SENSATION: WFL  POSTURE: forward head  PALPATION: Max TTP / hyper sensitivity to LT upper trap/ scapular area     CERVICAL ROM:   Active ROM A/PROM (deg) eval  Flexion WFL  Extension 5  Right lateral flexion   Left lateral flexion   Right rotation 48  Left rotation 55   (Blank rows = not tested)  UPPER EXTREMITY ROM:  Min restriction in bilat shoulder flexion and IR due to pain   UPPER EXTREMITY MMT:  MMT Right eval Left eval  Shoulder flexion 3- 3-  Shoulder extension    Shoulder abduction 3- 3-  Shoulder adduction    Shoulder extension    Shoulder internal rotation 3+ 3+  Shoulder external rotation 3+ 3+   (Blank rows = not tested)    TODAY'S TREATMENT:   02/03/22: Standing:  Postural 3 RTB 10X each  (Scap retraction, rows, shoulder ext) Seated:  Chin tucks 10X5"  Scap retractions 10X5"   3D cervical ROM  UE bil shoulder flexion 10X  Shoulder abduction 10X  Shoulder ER/IR 3# each LE 10X   Bicep curls 3# 10X  Hammer curls 3# 10X each Manual:  STM to bil upper traps  02/01/22: Goal review Seated:  Chin tucks 10X  Scap retractions 10X5"   3D cervical ROM  UE bil shoulder flexion 10X  Shoulder abduction 10X  Shoulder ER/IR 3# each LE 10X   Bicep curls 3# 10X  Hammer curls 3# 10X each Manual:  STM to bil upper traps  01/28/22 Eval Chin tuck Scap retraction Cervical AROM   PATIENT EDUCATION:  Education details: on eval findings, POC and HEP Person educated: Patient Education method: Explanation Education comprehension: verbalized understanding   HOME EXERCISE PROGRAM: 02/01/22: 3D cervical excursions 10X each  Access Code: [email or web address] URL: [email or web address] Date: 01/28/2022 Prepared by: Josue Hector  Exercises - Seated Cervical Retraction  - 3 x daily - 7 x weekly - 1 sets - 10 reps - 3-5 second hold - Seated Scapular Retraction  - 3 x daily - 7 x weekly - 1 sets - 10 reps - 3-5 second hold - Seated Cervical Rotation AROM  - 3 x daily - 7 x weekly - 1 sets - 10 reps - 3-5 second hold  ASSESSMENT:  CLINICAL  IMPRESSION: Continued with focus on improving postural and UE strengthening.  Began postural strengthening with theraband. Pt  required cues for form and general posturing during therex.   Continued manual with light touch initially due to sensitivity and progressed to slightly deeper to reduce tightness.  Less tightness throughout musculature and no spasms palpated today.  Pt reported reduction of pain to 3/10 at end of session.   Patient will benefit from skilled physical therapy services to address deficits, reduce pain and improve level of function with ADLs   OBJECTIVE IMPAIRMENTS decreased activity tolerance, decreased ROM, decreased strength, increased fascial restrictions, impaired flexibility, impaired UE functional use, improper body mechanics, postural dysfunction, and pain.   ACTIVITY LIMITATIONS carrying, lifting, sleeping, reach over head, and hygiene/grooming  PARTICIPATION LIMITATIONS: meal prep, cleaning, laundry, driving, shopping, community activity, occupation, and yard work  PERSONAL FACTORS  None  are also affecting patient's functional outcome.   REHAB POTENTIAL: Good  CLINICAL DECISION MAKING: Stable/uncomplicated  EVALUATION COMPLEXITY: Low   GOALS: SHORT TERM GOALS: Target date: 02/18/2022  Patient will be independent with initial HEP and self-management strategies to improve functional outcomes Baseline:  Goal status: MET    LONG TERM GOALS: Target date: 03/17/2022  Patient will be independent with advanced HEP and self-management strategies to improve functional outcomes Baseline:  Goal status: IN PROGRESS  2.  Patient will improve FOTO score to predicted value to indicate improvement in functional outcomes Baseline: 37% function Goal status: IN PROGRESS  3.  Patient will demo improved cervical extension by at least 10 degrees in order to improve ability to scan environment for safety and perform [place] ADLS.  Baseline: See AROM Goal status: IN  PROGRESS  4. Patient will report a decrease in neck pain to 0/10 at rest for improved quality of life and ability to perform UE ADLs  Baseline: 9/10 Goal status: IN PROGRESS   PLAN: PT FREQUENCY: 2x/week  PT DURATION: 6 weeks  PLANNED INTERVENTIONS: Therapeutic exercises, Therapeutic activity, Neuromuscular re-education, Balance training, Gait training, Patient/Family education, Joint manipulation, Joint mobilization, Stair training, Aquatic Therapy, Dry Needling, Electrical stimulation, Spinal manipulation, Spinal mobilization, Cryotherapy, Moist heat, scar mobilization, Taping, Traction, Ultrasound, Biofeedback, Ionotophoresis 79m/ml Dexamethasone, and Manual therapy.   PLAN FOR NEXT SESSION: Progress pain free cervical mobility and postural strengthening as tolerated. Manual STM as needed for pain.   9:00 AM, 02/03/22 ATeena Irani PTA/CLT CTakoma ParkPh: [phone]

## 2022-02-08 ENCOUNTER — Ambulatory Visit (HOSPITAL_COMMUNITY): Payer: Medicare HMO

## 2022-02-08 DIAGNOSIS — R293 Abnormal posture: Secondary | ICD-10-CM | POA: Diagnosis not present

## 2022-02-08 DIAGNOSIS — M542 Cervicalgia: Secondary | ICD-10-CM | POA: Diagnosis not present

## 2022-02-08 NOTE — Therapy (Signed)
OUTPATIENT PHYSICAL THERAPY TREATMENT  Patient Name: Mariellen OSA FOGARTY MRN: [id number] DOB:[birth year], 71 y.o., female Today's Date: 02/08/2022   PT End of Session - 02/08/22 1605     Visit Number 4    Number of Visits 12    Date for PT Re-Evaluation 03/11/22    Authorization Type [employer]    Authorization Time Period 12 visits approved 7/13-8/24    Authorization - Visit Number 4    Authorization - Number of Visits 12    Progress Note Due on Visit 10    PT Start Time 2224    PT Stop Time 1146    PT Time Calculation (min) 43 min    Activity Tolerance Patient tolerated treatment well;Patient limited by pain    Behavior During Therapy [facility] for tasks assessed/performed              Past Medical History:  Diagnosis Date   Anxiety    Asthma    had 1 episode 1 year ago-no more problem   CHF (congestive heart failure) (HCC)    swelling of feet & ankles   Cough    Depression    OCD   GERD (gastroesophageal reflux disease)    severe   OCD (obsessive compulsive disorder)    Shortness of breath    with exertion   Yeast infection 04/09/2014   Past Surgical History:  Procedure Laterality Date   CARPAL TUNNEL RELEASE  03/22/2012   Procedure: CARPAL TUNNEL RELEASE;  Surgeon: Wynonia Sours, MD;  Location: [place];  Service: Orthopedics;  Laterality: Right;  right carpal tunnel release   COLONOSCOPY  06/19/2012   Dr. Rourk:normal rectum and colon    ESOPHAGOGASTRODUODENOSCOPY N/A 12/26/2015   Dr. Gala Romney: normal    TONSILLECTOMY     TUBAL LIGATION     Patient Active Problem List   Diagnosis Date Noted   Hoarseness 03/14/2018   Perennial allergic rhinitis with a possible nonallergic component 10/04/2017   Moderate persistent asthma 10/04/2017   Oropharyngeal dysphagia 05/06/2017   Gastroesophageal reflux disease without esophagitis    Constipation 11/26/2015   Yeast infection 04/09/2014   Obesity (BMI 30-39.9) 01/31/2014   Cough, persistent    CHF (congestive heart  failure) ([place])    Depression    Anxiety    Encounter for screening colonoscopy 05/28/2012   GERD (gastroesophageal reflux disease) 05/28/2012   Low back pain 06/17/2011    PCP: Asencion Noble MD  REFERRING PROVIDER: Vallarie Mare, MD   REFERRING DIAG: [phone] Cervical Spondylosis   THERAPY DIAG:  Cervicalgia  Abnormal posture  Rationale for Evaluation and Treatment Rehabilitation  ONSET DATE: Chronic  SUBJECTIVE:  SUBJECTIVE STATEMENT: My neck is feeling better but had some increased pain with housecleaning;dusting, vacuuming up to 6/10  PERTINENT HISTORY:   Cervical Pain  PAIN:  Are you having pain? Yes: NPRS scale: 4/10 Pain location: base of neck, bil shoulder  Pain description: sharp, shooting Aggravating factors: Unsure Relieving factors: rest, meds   PRECAUTIONS: None  WEIGHT BEARING RESTRICTIONS No  FALLS:  Has patient fallen in last 6 months? No  LIVING ENVIRONMENT: Lives with: lives alone Lives in: House/apartment Stairs: Yes: External: 3 steps; can reach both and bilateral but cannot reach both Has following equipment at home: Single point cane  OCCUPATION: Greeter at [employer]   PLOF: [place] Get rid of pain   OBJECTIVE:   DIAGNOSTIC FINDINGS:  IMPRESSION: 1. Mild-to-moderate multilevel cervical spondylosis as described above.    PATIENT SURVEYS:  FOTO 37% function    COGNITION: Overall cognitive status: Within functional limits for tasks assessed   SENSATION: WFL  POSTURE: forward head  PALPATION: Max TTP / hyper sensitivity to LT upper trap/ scapular area    CERVICAL ROM:   Active ROM A/PROM (deg) eval  Flexion WFL  Extension 5  Right lateral flexion   Left lateral flexion   Right  rotation 48  Left rotation 55   (Blank rows = not tested)  UPPER EXTREMITY ROM:  Min restriction in bilat shoulder flexion and IR due to pain   UPPER EXTREMITY MMT:  MMT Right eval Left eval Right 02/08/22 Left 02/08/22  Shoulder flexion 3- 3- 4 4  Shoulder extension      Shoulder abduction 3- 3- 4- 4-  Shoulder adduction      Shoulder extension      Shoulder internal rotation 3+ 3+    Shoulder external rotation 3+ 3+     (Blank rows = not tested)    TODAY'S TREATMENT:  02/08/22 Seated: Cervical retractions 5" hold x 10 Scapular retractions 5" hold x 10 Cervical AROM x 10 each Bicep curl 3# 2 x 10 Hammer curls 3# 2 x 10  Manual:  STM to bil upper traps and cervical spine x 10' to decrease pain and improve mobility; no other treatments performed during manual treatment  UBE 2' forward and 2' backwards        02/03/22: Standing:  Postural 3 RTB 10X each  (Scap retraction, rows, shoulder ext) Seated:  Chin tucks 10X5"  Scap retractions 10X5"   3D cervical ROM  UE bil shoulder flexion 10X  Shoulder abduction 10X  Shoulder ER/IR 3# each LE 10X   Bicep curls 3# 10X  Hammer curls 3# 10X each Manual:  STM to bil upper traps  02/01/22: Goal review Seated:  Chin tucks 10X  Scap retractions 10X5"   3D cervical ROM  UE bil shoulder flexion 10X  Shoulder abduction 10X  Shoulder ER/IR 3# each LE 10X   Bicep curls 3# 10X  Hammer curls 3# 10X each Manual:  STM to bil upper traps  01/28/22 Eval Chin tuck Scap retraction Cervical AROM   PATIENT EDUCATION:  Education details: on eval findings, POC and HEP Person educated: Patient Education method: Explanation Education comprehension: verbalized understanding   HOME EXERCISE PROGRAM: 02/01/22: 3D cervical excursions 10X each  Access Code: [email or web address] URL: [email or web address] Date: 01/28/2022 Prepared by: Josue Hector  Exercises - Seated Cervical Retraction  - 3 x daily - 7 x weekly - 1  sets - 10 reps - 3-5 second hold - Seated Scapular Retraction  -  3 x daily - 7 x weekly - 1 sets - 10 reps - 3-5 second hold - Seated Cervical Rotation AROM  - 3 x daily - 7 x weekly - 1 sets - 10 reps - 3-5 second hold  ASSESSMENT:  CLINICAL IMPRESSION: Today's session continued to focus on cervical mobility and upper extremity and postural strengthening. Added UBE to treatment today. Increased reps of upper extremity strengthening.  Noted improved strength bilateral shoulder flexion and abduction.  Patient will benefit from skilled physical therapy services to address deficits, reduce pain and improve level of function with ADLs   OBJECTIVE IMPAIRMENTS decreased activity tolerance, decreased ROM, decreased strength, increased fascial restrictions, impaired flexibility, impaired UE functional use, improper body mechanics, postural dysfunction, and pain.   ACTIVITY LIMITATIONS carrying, lifting, sleeping, reach over head, and hygiene/grooming  PARTICIPATION LIMITATIONS: meal prep, cleaning, laundry, driving, shopping, community activity, occupation, and yard work  PERSONAL FACTORS  None  are also affecting patient's functional outcome.   REHAB POTENTIAL: Good  CLINICAL DECISION MAKING: Stable/uncomplicated  EVALUATION COMPLEXITY: Low   GOALS: SHORT TERM GOALS: Target date: 02/18/2022  Patient will be independent with initial HEP and self-management strategies to improve functional outcomes Baseline:  Goal status: MET    LONG TERM GOALS: Target date: 03/22/2022  Patient will be independent with advanced HEP and self-management strategies to improve functional outcomes Baseline:  Goal status: IN PROGRESS  2.  Patient will improve FOTO score to predicted value to indicate improvement in functional outcomes Baseline: 37% function Goal status: IN PROGRESS  3.  Patient will demo improved cervical extension by at least 10 degrees in order to improve ability to scan environment for  safety and perform [place] ADLS.  Baseline: See AROM Goal status: IN PROGRESS  4. Patient will report a decrease in neck pain to 0/10 at rest for improved quality of life and ability to perform UE ADLs  Baseline: 9/10 Goal status: IN PROGRESS   PLAN: PT FREQUENCY: 2x/week  PT DURATION: 6 weeks  PLANNED INTERVENTIONS: Therapeutic exercises, Therapeutic activity, Neuromuscular re-education, Balance training, Gait training, Patient/Family education, Joint manipulation, Joint mobilization, Stair training, Aquatic Therapy, Dry Needling, Electrical stimulation, Spinal manipulation, Spinal mobilization, Cryotherapy, Moist heat, scar mobilization, Taping, Traction, Ultrasound, Biofeedback, Ionotophoresis 1m/ml Dexamethasone, and Manual therapy.   PLAN FOR NEXT SESSION: Progress pain free cervical mobility and postural strengthening as tolerated. Manual STM as needed for pain.   4:47 PM, 02/08/22 Olsen Mccutchan Small Eesha Schmaltz MPT [place] [facility]  #[phone]

## 2022-02-10 ENCOUNTER — Ambulatory Visit (HOSPITAL_COMMUNITY): Payer: Medicare HMO

## 2022-02-10 DIAGNOSIS — R293 Abnormal posture: Secondary | ICD-10-CM | POA: Diagnosis not present

## 2022-02-10 DIAGNOSIS — M542 Cervicalgia: Secondary | ICD-10-CM | POA: Diagnosis not present

## 2022-02-10 NOTE — Therapy (Signed)
OUTPATIENT PHYSICAL THERAPY TREATMENT  Patient Name: [name] MRN: [id number] DOB:1950-09-21, 71 y.o., female Today's Date: 02/10/2022   PT End of Session - 02/10/22 1604     Visit Number 5    Number of Visits 12    Date for PT Re-Evaluation 03/11/22    Authorization Type [employer]    Authorization Time Period 12 visits approved 7/13-8/24    Authorization - Visit Number 4    Authorization - Number of Visits 12    Progress Note Due on Visit 10    PT Start Time 1600    PT Stop Time 1640    PT Time Calculation (min) 40 min    Activity Tolerance Patient tolerated treatment well;Patient limited by pain    Behavior During Therapy [facility] for tasks assessed/performed               Past Medical History:  Diagnosis Date   Anxiety    Asthma    had 1 episode 1 year ago-no more problem   CHF (congestive heart failure) (HCC)    swelling of feet & ankles   Cough    Depression    OCD   GERD (gastroesophageal reflux disease)    severe   OCD (obsessive compulsive disorder)    Shortness of breath    with exertion   Yeast infection 04/09/2014   Past Surgical History:  Procedure Laterality Date   CARPAL TUNNEL RELEASE  03/22/2012   Procedure: CARPAL TUNNEL RELEASE;  Surgeon: Wynonia Sours, MD;  Location: [facility];  Service: Orthopedics;  Laterality: Right;  right carpal tunnel release   COLONOSCOPY  06/19/2012   Dr. Rourk:normal rectum and colon    ESOPHAGOGASTRODUODENOSCOPY N/A 12/26/2015   Dr. Gala Romney: normal    TONSILLECTOMY     TUBAL LIGATION     Patient Active Problem List   Diagnosis Date Noted   Hoarseness 03/14/2018   Perennial allergic rhinitis with a possible nonallergic component 10/04/2017   Moderate persistent asthma 10/04/2017   Oropharyngeal dysphagia 05/06/2017   Gastroesophageal reflux disease without esophagitis    Constipation 11/26/2015   Yeast infection 04/09/2014   Obesity (BMI 30-39.9) 01/31/2014   Cough, persistent    CHF (congestive  heart failure) ([place])    Depression    Anxiety    Encounter for screening colonoscopy 05/28/2012   GERD (gastroesophageal reflux disease) 05/28/2012   Low back pain 06/17/2011    PCP: Asencion Noble MD  REFERRING PROVIDER: Vallarie Mare, MD   REFERRING DIAG: [phone] Cervical Spondylosis   THERAPY DIAG:  Cervicalgia  Abnormal posture  Rationale for Evaluation and Treatment Rehabilitation  ONSET DATE: Chronic  SUBJECTIVE:  SUBJECTIVE STATEMENT: Neck is sore after mowing  PERTINENT HISTORY:   Cervical Pain  PAIN:  Are you having pain? Yes: NPRS scale: 6/10 Pain location: base of neck, bil shoulder  Pain description: sharp, shooting Aggravating factors: Unsure Relieving factors: rest, meds   PRECAUTIONS: None  WEIGHT BEARING RESTRICTIONS No  FALLS:  Has patient fallen in last 6 months? No  LIVING ENVIRONMENT: Lives with: lives alone Lives in: House/apartment Stairs: Yes: External: 3 steps; can reach both and bilateral but cannot reach both Has following equipment at home: Single point cane  OCCUPATION: Greeter at [employer]   PLOF: [place] Get rid of pain   OBJECTIVE:   DIAGNOSTIC FINDINGS:  IMPRESSION: 1. Mild-to-moderate multilevel cervical spondylosis as described above.    PATIENT SURVEYS:  FOTO 37% function    COGNITION: Overall cognitive status: Within functional limits for tasks assessed   SENSATION: WFL  POSTURE: forward head  PALPATION: Max TTP / hyper sensitivity to LT upper trap/ scapular area    CERVICAL ROM:   Active ROM A/PROM (deg) eval  Flexion WFL  Extension 5  Right lateral flexion   Left lateral flexion   Right rotation 48  Left rotation 55   (Blank rows = not tested)  UPPER EXTREMITY  ROM:  Min restriction in bilat shoulder flexion and IR due to pain   UPPER EXTREMITY MMT:  MMT Right eval Left eval Right 02/08/22 Left 02/08/22  Shoulder flexion 3- 3- 4 4  Shoulder extension      Shoulder abduction 3- 3- 4- 4-  Shoulder adduction      Shoulder extension      Shoulder internal rotation 3+ 3+    Shoulder external rotation 3+ 3+     (Blank rows = not tested)    TODAY'S TREATMENT:  02/10/22 Seated: STM to bilateral upper traps and cervical spine x 10' to decrease pain and improve mobility; not other treatments performed during manual treatment. Cervical retractions x 10 Scapular retractions x 10 Cervical AROM x 10 each  UBE 2' forward, 2' back  RTB scap retractions 2 x 10 RTB shoulder extensions 2 x 10  Bicep curl 3# 2 x 10 Hammer curls 3# 2 x 10       02/08/22 Seated: Cervical retractions 5" hold x 10 Scapular retractions 5" hold x 10 Cervical AROM x 10 each Bicep curl 3# 2 x 10 Hammer curls 3# 2 x 10  Manual:  STM to bil upper traps and cervical spine x 10' to decrease pain and improve mobility; no other treatments performed during manual treatment  UBE 2' forward and 2' backwards        02/03/22: Standing:  Postural 3 RTB 10X each  (Scap retraction, rows, shoulder ext) Seated:  Chin tucks 10X5"  Scap retractions 10X5"   3D cervical ROM  UE bil shoulder flexion 10X  Shoulder abduction 10X  Shoulder ER/IR 3# each LE 10X   Bicep curls 3# 10X  Hammer curls 3# 10X each Manual:  STM to bil upper traps  02/01/22: Goal review Seated:  Chin tucks 10X  Scap retractions 10X5"   3D cervical ROM  UE bil shoulder flexion 10X  Shoulder abduction 10X  Shoulder ER/IR 3# each LE 10X   Bicep curls 3# 10X  Hammer curls 3# 10X each Manual:  STM to bil upper traps  01/28/22 Eval Chin tuck Scap retraction Cervical AROM   PATIENT EDUCATION:  Education details: on eval findings, POC and HEP  Person educated: Patient Education method:  Explanation Education comprehension: verbalized understanding   HOME EXERCISE PROGRAM: 02/01/22: 3D cervical excursions 10X each  Access Code: [email or web address] URL: [email or web address] Date: 01/28/2022 Prepared by: Josue Hector  Exercises - Seated Cervical Retraction  - 3 x daily - 7 x weekly - 1 sets - 10 reps - 3-5 second hold - Seated Scapular Retraction  - 3 x daily - 7 x weekly - 1 sets - 10 reps - 3-5 second hold - Seated Cervical Rotation AROM  - 3 x daily - 7 x weekly - 1 sets - 10 reps - 3-5 second hold  ASSESSMENT:  CLINICAL IMPRESSION: Today's session continued to focus on cervical mobility and upper extremity and postural strengthening. Added theraband posterior shoulder and thoracic spine strengthening today.  Patient continues to elevate shoulders; use upper traps during exercise.  Patient will benefit from skilled physical therapy services to address deficits, reduce pain and improve level of function with ADLs   OBJECTIVE IMPAIRMENTS decreased activity tolerance, decreased ROM, decreased strength, increased fascial restrictions, impaired flexibility, impaired UE functional use, improper body mechanics, postural dysfunction, and pain.   ACTIVITY LIMITATIONS carrying, lifting, sleeping, reach over head, and hygiene/grooming  PARTICIPATION LIMITATIONS: meal prep, cleaning, laundry, driving, shopping, community activity, occupation, and yard work  PERSONAL FACTORS  None  are also affecting patient's functional outcome.   REHAB POTENTIAL: Good  CLINICAL DECISION MAKING: Stable/uncomplicated  EVALUATION COMPLEXITY: Low   GOALS: SHORT TERM GOALS: Target date: 02/18/2022  Patient will be independent with initial HEP and self-management strategies to improve functional outcomes Baseline:  Goal status: MET    LONG TERM GOALS: Target date: 03/24/2022  Patient will be independent with advanced HEP and self-management strategies to improve functional  outcomes Baseline:  Goal status: IN PROGRESS  2.  Patient will improve FOTO score to predicted value to indicate improvement in functional outcomes Baseline: 37% function Goal status: IN PROGRESS  3.  Patient will demo improved cervical extension by at least 10 degrees in order to improve ability to scan environment for safety and perform OH ADLS.  Baseline: See AROM Goal status: IN PROGRESS  4. Patient will report a decrease in neck pain to 0/10 at rest for improved quality of life and ability to perform UE ADLs  Baseline: 9/10 Goal status: IN PROGRESS   PLAN: PT FREQUENCY: 2x/week  PT DURATION: 6 weeks  PLANNED INTERVENTIONS: Therapeutic exercises, Therapeutic activity, Neuromuscular re-education, Balance training, Gait training, Patient/Family education, Joint manipulation, Joint mobilization, Stair training, Aquatic Therapy, Dry Needling, Electrical stimulation, Spinal manipulation, Spinal mobilization, Cryotherapy, Moist heat, scar mobilization, Taping, Traction, Ultrasound, Biofeedback, Ionotophoresis 39m/ml Dexamethasone, and Manual therapy.   PLAN FOR NEXT SESSION: Progress pain free cervical mobility and postural strengthening as tolerated. Manual STM as needed for pain.   4:41 PM, 02/10/22 [name] [name] MPT [place] [facility] [place] #[phone]

## 2022-02-15 ENCOUNTER — Ambulatory Visit (HOSPITAL_COMMUNITY): Payer: Medicare HMO

## 2022-02-15 DIAGNOSIS — R293 Abnormal posture: Secondary | ICD-10-CM | POA: Diagnosis not present

## 2022-02-15 DIAGNOSIS — M542 Cervicalgia: Secondary | ICD-10-CM

## 2022-02-15 NOTE — Therapy (Signed)
OUTPATIENT PHYSICAL THERAPY TREATMENT  Patient Name: [name] MRN: [id number] DOB:August 22, 1950, 71 y.o., female Today's Date: 02/15/2022   PT End of Session - 02/15/22 1559     Visit Number 6    Number of Visits 12    Date for PT Re-Evaluation 03/11/22    Authorization Type [employer]    Authorization Time Period 12 visits approved 7/13-8/24    Authorization - Visit Number 5    Authorization - Number of Visits 12    Progress Note Due on Visit 10    PT Start Time 1600    PT Stop Time 1642    PT Time Calculation (min) 42 min    Activity Tolerance Patient tolerated treatment well;Patient limited by pain    Behavior During Therapy WFL for tasks assessed/performed                Past Medical History:  Diagnosis Date   Anxiety    Asthma    had 1 episode 1 year ago-no more problem   CHF (congestive heart failure) (HCC)    swelling of feet & ankles   Cough    Depression    OCD   GERD (gastroesophageal reflux disease)    severe   OCD (obsessive compulsive disorder)    Shortness of breath    with exertion   Yeast infection 04/09/2014   Past Surgical History:  Procedure Laterality Date   CARPAL TUNNEL RELEASE  03/22/2012   Procedure: CARPAL TUNNEL RELEASE;  Surgeon: Wynonia Sours, MD;  Location: [facility];  Service: Orthopedics;  Laterality: Right;  right carpal tunnel release   COLONOSCOPY  06/19/2012   Dr. Rourk:normal rectum and colon    ESOPHAGOGASTRODUODENOSCOPY N/A 12/26/2015   Dr. Gala Romney: normal    TONSILLECTOMY     TUBAL LIGATION     Patient Active Problem List   Diagnosis Date Noted   Hoarseness 03/14/2018   Perennial allergic rhinitis with a possible nonallergic component 10/04/2017   Moderate persistent asthma 10/04/2017   Oropharyngeal dysphagia 05/06/2017   Gastroesophageal reflux disease without esophagitis    Constipation 11/26/2015   Yeast infection 04/09/2014   Obesity (BMI 30-39.9) 01/31/2014   Cough, persistent    CHF (congestive  heart failure) ([place])    Depression    Anxiety    Encounter for screening colonoscopy 05/28/2012   GERD (gastroesophageal reflux disease) 05/28/2012   Low back pain 06/17/2011    PCP: Asencion Noble MD  REFERRING PROVIDER: Vallarie Mare, MD   REFERRING DIAG: [phone] Cervical Spondylosis   THERAPY DIAG:  Cervicalgia  Abnormal posture  Rationale for Evaluation and Treatment Rehabilitation  ONSET DATE: Chronic  SUBJECTIVE:  SUBJECTIVE STATEMENT:  Right side upper trap area is sore/ pulling in neck.  PERTINENT HISTORY:   Cervical Pain  PAIN:  Are you having pain? Yes: NPRS scale: 5/10 Pain location: base of neck, bil shoulder  Pain description: sharp, shooting Aggravating factors: Unsure Relieving factors: rest, meds   PRECAUTIONS: None  WEIGHT BEARING RESTRICTIONS No  FALLS:  Has patient fallen in last 6 months? No  LIVING ENVIRONMENT: Lives with: lives alone Lives in: House/apartment Stairs: Yes: External: 3 steps; can reach both and bilateral but cannot reach both Has following equipment at home: Single point cane  OCCUPATION: Greeter at [employer]   PLOF: [place] Get rid of pain   OBJECTIVE:   DIAGNOSTIC FINDINGS:  IMPRESSION: 1. Mild-to-moderate multilevel cervical spondylosis as described above.    PATIENT SURVEYS:  FOTO 37% function    COGNITION: Overall cognitive status: Within functional limits for tasks assessed   SENSATION: WFL  POSTURE: forward head  PALPATION: Max TTP / hyper sensitivity to LT upper trap/ scapular area    CERVICAL ROM:   Active ROM A/PROM (deg) eval  Flexion WFL  Extension 5  Right lateral flexion   Left lateral flexion   Right rotation 48  Left rotation 55   (Blank rows =  not tested)  UPPER EXTREMITY ROM:  Min restriction in bilat shoulder flexion and IR due to pain   UPPER EXTREMITY MMT:  MMT Right eval Left eval Right 02/08/22 Left 02/08/22  Shoulder flexion 3- 3- 4 4  Shoulder extension      Shoulder abduction 3- 3- 4- 4-  Shoulder adduction      Shoulder extension      Shoulder internal rotation 3+ 3+    Shoulder external rotation 3+ 3+     (Blank rows = not tested)    TODAY'S TREATMENT:  02/15/22 Seated: STM to bilateral upper traps and cervical spine x 10' to decrease pain and improve mobility; not other treatments performed during manual treatment. Cervical retractions x 10 Scapular retractions x 10  Bicep curl 4# 2 x 10 Hammer curls 4# 2 x 10  UBE 2' forward, 2' back  GTB shoulder extensions and rows 2 x 10 RTB overdoor retractions 2 x 10 RTB tricep press 2 x 10          02/10/22 Seated: STM to bilateral upper traps and cervical spine x 10' to decrease pain and improve mobility; not other treatments performed during manual treatment. Cervical retractions x 10 Scapular retractions x 10 Cervical AROM x 10 each  UBE 2' forward, 2' back  RTB scap retractions 2 x 10 RTB shoulder extensions 2 x 10  Bicep curl 3# 2 x 10 Hammer curls 3# 2 x 10       02/08/22 Seated: Cervical retractions 5" hold x 10 Scapular retractions 5" hold x 10 Cervical AROM x 10 each Bicep curl 3# 2 x 10 Hammer curls 3# 2 x 10  Manual:  STM to bil upper traps and cervical spine x 10' to decrease pain and improve mobility; no other treatments performed during manual treatment  UBE 2' forward and 2' backwards        02/03/22: Standing:  Postural 3 RTB 10X each  (Scap retraction, rows, shoulder ext) Seated:  Chin tucks 10X5"  Scap retractions 10X5"   3D cervical ROM  UE bil shoulder flexion 10X  Shoulder abduction 10X  Shoulder ER/IR 3# each LE 10X   Bicep curls 3# 10X  Hammer curls 3# 10X each Manual:  STM to bil upper  traps  02/01/22: Goal review Seated:  Chin tucks 10X  Scap retractions 10X5"   3D cervical ROM  UE bil shoulder flexion 10X  Shoulder abduction 10X  Shoulder ER/IR 3# each LE 10X   Bicep curls 3# 10X  Hammer curls 3# 10X each Manual:  STM to bil upper traps  01/28/22 Eval Chin tuck Scap retraction Cervical AROM   PATIENT EDUCATION:  Education details: updated hep on eval findings, POC and HEP Person educated: Patient Education method: Explanation Education comprehension: verbalized understanding   HOME EXERCISE PROGRAM: 02/15/2022 Access Code: [email or web address] URL: [email or web address] Date: 02/15/2022 Prepared by: [facility] - Rehab  Exercises - Seated Cervical Retraction  - 3 x daily - 7 x weekly - 1 sets - 10 reps - 3-5 second hold - Seated Scapular Retraction  - 3 x daily - 7 x weekly - 1 sets - 10 reps - 3-5 second hold - Seated Cervical Rotation AROM  - 3 x daily - 7 x weekly - 1 sets - 10 reps - 3-5 second hold - Scapular Retraction with Resistance  - 1 x daily - 7 x weekly - 3 sets - 10 reps - Shoulder extension with resistance - Neutral  - 1 x daily - 7 x weekly - 2 sets - 10 reps  02/01/22: 3D cervical excursions 10X each  Access Code: [email or web address] URL: [email or web address] Date: 01/28/2022 Prepared by: Josue Hector  Exercises - Seated Cervical Retraction  - 3 x daily - 7 x weekly - 1 sets - 10 reps - 3-5 second hold - Seated Scapular Retraction  - 3 x daily - 7 x weekly - 1 sets - 10 reps - 3-5 second hold - Seated Cervical Rotation AROM  - 3 x daily - 7 x weekly - 1 sets - 10 reps - 3-5 second hold  ASSESSMENT:  CLINICAL IMPRESSION: Today's session continued to focus on cervical mobility and upper extremity and postural strengthening.increased weight of curls and resistance of thera- band with rows and extensions. Added overdoor retractions and tricep press today, updated hep.  Patient will benefit from skilled physical therapy services  to address deficits, reduce pain and improve level of function with ADLs   OBJECTIVE IMPAIRMENTS decreased activity tolerance, decreased ROM, decreased strength, increased fascial restrictions, impaired flexibility, impaired UE functional use, improper body mechanics, postural dysfunction, and pain.   ACTIVITY LIMITATIONS carrying, lifting, sleeping, reach over head, and hygiene/grooming  PARTICIPATION LIMITATIONS: meal prep, cleaning, laundry, driving, shopping, community activity, occupation, and yard work  PERSONAL FACTORS  None  are also affecting patient's functional outcome.   REHAB POTENTIAL: Good  CLINICAL DECISION MAKING: Stable/uncomplicated  EVALUATION COMPLEXITY: Low   GOALS: SHORT TERM GOALS: Target date: 02/18/2022  Patient will be independent with initial HEP and self-management strategies to improve functional outcomes Baseline:  Goal status: MET    LONG TERM GOALS: Target date: 03/29/2022  Patient will be independent with advanced HEP and self-management strategies to improve functional outcomes Baseline:  Goal status: IN PROGRESS  2.  Patient will improve FOTO score to predicted value to indicate improvement in functional outcomes Baseline: 37% function Goal status: IN PROGRESS  3.  Patient will demo improved cervical extension by at least 10 degrees in order to improve ability to scan environment for safety and perform [place] ADLS.  Baseline: See AROM Goal status: IN PROGRESS  4. Patient will report a decrease in neck pain to 0/10 at rest for  improved quality of life and ability to perform UE ADLs  Baseline: 9/10 Goal status: IN PROGRESS   PLAN: PT FREQUENCY: 2x/week  PT DURATION: 6 weeks  PLANNED INTERVENTIONS: Therapeutic exercises, Therapeutic activity, Neuromuscular re-education, Balance training, Gait training, Patient/Family education, Joint manipulation, Joint mobilization, Stair training, Aquatic Therapy, Dry Needling, Electrical stimulation,  Spinal manipulation, Spinal mobilization, Cryotherapy, Moist heat, scar mobilization, Taping, Traction, Ultrasound, Biofeedback, Ionotophoresis 48m/ml Dexamethasone, and Manual therapy.   PLAN FOR NEXT SESSION: Progress pain free cervical mobility and postural strengthening as tolerated. Manual STM as needed for pain.   4:44 PM, 02/15/22 Cristalle Rohm Small Maicey Barrientez MPT [facility] [place] #[phone]

## 2022-02-22 ENCOUNTER — Ambulatory Visit (HOSPITAL_COMMUNITY): Payer: Medicare HMO | Attending: Neurosurgery | Admitting: Physical Therapy

## 2022-02-22 DIAGNOSIS — M542 Cervicalgia: Secondary | ICD-10-CM | POA: Diagnosis not present

## 2022-02-22 DIAGNOSIS — R293 Abnormal posture: Secondary | ICD-10-CM | POA: Diagnosis not present

## 2022-02-22 NOTE — Therapy (Signed)
OUTPATIENT PHYSICAL THERAPY TREATMENT  Patient Name: [name] MRN: [id number] DOB:02-04-1951, 71 y.o., female Today's Date: 02/22/2022   PT End of Session - 02/22/22 1606     Visit Number 7    Number of Visits 12    Date for PT Re-Evaluation 03/11/22    Authorization Type [employer]    Authorization Time Period 12 visits approved 7/13-8/24    Authorization - Visit Number 7    Authorization - Number of Visits 12    Progress Note Due on Visit 10    PT Start Time 1604    PT Stop Time 6010    PT Time Calculation (min) 40 min    Activity Tolerance Patient tolerated treatment well    Behavior During Therapy WFL for tasks assessed/performed                Past Medical History:  Diagnosis Date   Anxiety    Asthma    had 1 episode 1 year ago-no more problem   CHF (congestive heart failure) (HCC)    swelling of feet & ankles   Cough    Depression    OCD   GERD (gastroesophageal reflux disease)    severe   OCD (obsessive compulsive disorder)    Shortness of breath    with exertion   Yeast infection 04/09/2014   Past Surgical History:  Procedure Laterality Date   CARPAL TUNNEL RELEASE  03/22/2012   Procedure: CARPAL TUNNEL RELEASE;  Surgeon: Wynonia Sours, MD;  Location: [facility];  Service: Orthopedics;  Laterality: Right;  right carpal tunnel release   COLONOSCOPY  06/19/2012   Dr. Rourk:normal rectum and colon    ESOPHAGOGASTRODUODENOSCOPY N/A 12/26/2015   Dr. Gala Romney: normal    TONSILLECTOMY     TUBAL LIGATION     Patient Active Problem List   Diagnosis Date Noted   Hoarseness 03/14/2018   Perennial allergic rhinitis with a possible nonallergic component 10/04/2017   Moderate persistent asthma 10/04/2017   Oropharyngeal dysphagia 05/06/2017   Gastroesophageal reflux disease without esophagitis    Constipation 11/26/2015   Yeast infection 04/09/2014   Obesity (BMI 30-39.9) 01/31/2014   Cough, persistent    CHF (congestive heart failure) ([place])     Depression    Anxiety    Encounter for screening colonoscopy 05/28/2012   GERD (gastroesophageal reflux disease) 05/28/2012   Low back pain 06/17/2011    PCP: Asencion Noble MD  REFERRING PROVIDER: Vallarie Mare, MD   REFERRING DIAG: [phone] Cervical Spondylosis   THERAPY DIAG:  Cervicalgia  Abnormal posture  Rationale for Evaluation and Treatment Rehabilitation  ONSET DATE: Chronic  SUBJECTIVE:  SUBJECTIVE STATEMENT: Patient states she is doing much better. She is sleeping better and can turn her head now with no pain. No pain currently.   PERTINENT HISTORY:   Cervical Pain  PAIN:  Are you having pain? No   PRECAUTIONS: None  WEIGHT BEARING RESTRICTIONS No  FALLS:  Has patient fallen in last 6 months? No  LIVING ENVIRONMENT: Lives with: lives alone Lives in: House/apartment Stairs: Yes: External: 3 steps; can reach both and bilateral but cannot reach both Has following equipment at home: Single point cane  OCCUPATION: Greeter at [employer]   PLOF: [place] Get rid of pain   OBJECTIVE:   DIAGNOSTIC FINDINGS:  IMPRESSION: 1. Mild-to-moderate multilevel cervical spondylosis as described above.    PATIENT SURVEYS:  FOTO 37% function    COGNITION: Overall cognitive status: Within functional limits for tasks assessed   SENSATION: WFL  POSTURE: forward head  PALPATION: Max TTP / hyper sensitivity to LT upper trap/ scapular area    CERVICAL ROM:   Active ROM A/PROM (deg) eval  Flexion WFL  Extension 5  Right lateral flexion   Left lateral flexion   Right rotation 48  Left rotation 55   (Blank rows = not tested)  UPPER EXTREMITY ROM:  Min restriction in bilat shoulder flexion and IR due to pain   UPPER  EXTREMITY MMT:  MMT Right eval Left eval Right 02/08/22 Left 02/08/22  Shoulder flexion 3- 3- 4 4  Shoulder extension      Shoulder abduction 3- 3- 4- 4-  Shoulder adduction      Shoulder extension      Shoulder internal rotation 3+ 3+    Shoulder external rotation 3+ 3+     (Blank rows = not tested)    TODAY'S TREATMENT:   02/23/20 UBE 2/2 Lv 1  Chin tuck 10 x 3"  Band rows GTB 20 x 3" Shoulder extension GTB 20 x 3" Shoulder ER GTB 20 x 3"  Wall slides 10 x 3"  Pec stretch  3 x 20"   Bicep curl 4# 2 x 10 Hammer curls 4# 2 x 10  IASTM to bilateral upper trap (seated)   02/15/22 Seated: STM to bilateral upper traps and cervical spine x 10' to decrease pain and improve mobility; not other treatments performed during manual treatment. Cervical retractions x 10 Scapular retractions x 10  Bicep curl 4# 2 x 10 Hammer curls 4# 2 x 10  UBE 2' forward, 2' back  GTB shoulder extensions and rows 2 x 10 RTB overdoor retractions 2 x 10 RTB tricep press 2 x 10   02/10/22 Seated: STM to bilateral upper traps and cervical spine x 10' to decrease pain and improve mobility; not other treatments performed during manual treatment. Cervical retractions x 10 Scapular retractions x 10 Cervical AROM x 10 each  UBE 2' forward, 2' back  RTB scap retractions 2 x 10 RTB shoulder extensions 2 x 10  Bicep curl 3# 2 x 10 Hammer curls 3# 2 x 10   02/08/22 Seated: Cervical retractions 5" hold x 10 Scapular retractions 5" hold x 10 Cervical AROM x 10 each Bicep curl 3# 2 x 10 Hammer curls 3# 2 x 10  Manual:  STM to bil upper traps and cervical spine x 10' to decrease pain and improve mobility; no other treatments performed during manual treatment  UBE 2' forward and 2' backwards    02/03/22: Standing:  Postural 3 RTB 10X each  (  Scap retraction, rows, shoulder ext) Seated:  Chin tucks 10X5"  Scap retractions 10X5"   3D cervical ROM  UE bil shoulder flexion 10X  Shoulder  abduction 10X  Shoulder ER/IR 3# each LE 10X   Bicep curls 3# 10X  Hammer curls 3# 10X each Manual:  STM to bil upper traps  02/01/22: Goal review Seated:  Chin tucks 10X  Scap retractions 10X5"   3D cervical ROM  UE bil shoulder flexion 10X  Shoulder abduction 10X  Shoulder ER/IR 3# each LE 10X   Bicep curls 3# 10X  Hammer curls 3# 10X each Manual:  STM to bil upper traps  01/28/22 Eval Chin tuck Scap retraction Cervical AROM   PATIENT EDUCATION:  Education details: updated hep on eval findings, POC and HEP Person educated: Patient Education method: Explanation Education comprehension: verbalized understanding   HOME EXERCISE PROGRAM: 02/15/2022 Access Code: [email or web address] URL: [email or web address] Date: 02/15/2022 Prepared by: AP - Rehab  Exercises - Seated Cervical Retraction  - 3 x daily - 7 x weekly - 1 sets - 10 reps - 3-5 second hold - Seated Scapular Retraction  - 3 x daily - 7 x weekly - 1 sets - 10 reps - 3-5 second hold - Seated Cervical Rotation AROM  - 3 x daily - 7 x weekly - 1 sets - 10 reps - 3-5 second hold - Scapular Retraction with Resistance  - 1 x daily - 7 x weekly - 3 sets - 10 reps - Shoulder extension with resistance - Neutral  - 1 x daily - 7 x weekly - 2 sets - 10 reps  02/01/22: 3D cervical excursions 10X each  Access Code: [email or web address] URL: [email or web address] Date: 01/28/2022 Prepared by: Josue Hector  Exercises - Seated Cervical Retraction  - 3 x daily - 7 x weekly - 1 sets - 10 reps - 3-5 second hold - Seated Scapular Retraction  - 3 x daily - 7 x weekly - 1 sets - 10 reps - 3-5 second hold - Seated Cervical Rotation AROM  - 3 x daily - 7 x weekly - 1 sets - 10 reps - 3-5 second hold  ASSESSMENT:  CLINICAL IMPRESSION: Patient tolerated session well today with no increased complaint of pain. Progressed postural strengthening with increased band resistance. Patient required verbal cues for body mechanics  with added wall slides. Patient will continue to benefit from skilled therapy services to reduce remaining deficits and improve functional ability.      OBJECTIVE IMPAIRMENTS decreased activity tolerance, decreased ROM, decreased strength, increased fascial restrictions, impaired flexibility, impaired UE functional use, improper body mechanics, postural dysfunction, and pain.   ACTIVITY LIMITATIONS carrying, lifting, sleeping, reach over head, and hygiene/grooming  PARTICIPATION LIMITATIONS: meal prep, cleaning, laundry, driving, shopping, community activity, occupation, and yard work  PERSONAL FACTORS  None  are also affecting patient's functional outcome.   REHAB POTENTIAL: Good  CLINICAL DECISION MAKING: Stable/uncomplicated  EVALUATION COMPLEXITY: Low   GOALS: SHORT TERM GOALS: Target date: 02/18/2022  Patient will be independent with initial HEP and self-management strategies to improve functional outcomes Baseline:  Goal status: MET    LONG TERM GOALS: Target date: 04/05/2022  Patient will be independent with advanced HEP and self-management strategies to improve functional outcomes Baseline:  Goal status: IN PROGRESS  2.  Patient will improve FOTO score to predicted value to indicate improvement in functional outcomes Baseline: 37% function Goal status: IN PROGRESS  3.  Patient will demo improved cervical extension by at least 10 degrees in  order to improve ability to scan environment for safety and perform OH ADLS.  Baseline: See AROM Goal status: IN PROGRESS  4. Patient will report a decrease in neck pain to 0/10 at rest for improved quality of life and ability to perform UE ADLs  Baseline: 9/10 Goal status: IN PROGRESS   PLAN: PT FREQUENCY: 2x/week  PT DURATION: 6 weeks  PLANNED INTERVENTIONS: Therapeutic exercises, Therapeutic activity, Neuromuscular re-education, Balance training, Gait training, Patient/Family education, Joint manipulation, Joint  mobilization, Stair training, Aquatic Therapy, Dry Needling, Electrical stimulation, Spinal manipulation, Spinal mobilization, Cryotherapy, Moist heat, scar mobilization, Taping, Traction, Ultrasound, Biofeedback, Ionotophoresis 109m/ml Dexamethasone, and Manual therapy.   PLAN FOR NEXT SESSION: Progress pain free cervical mobility and postural strengthening as tolerated. Manual STM as needed for pain.   4:08 PM, 02/22/22 CJosue HectorPT DPT  Physical [facility]rap[facility] [phone]

## 2022-02-24 ENCOUNTER — Ambulatory Visit (HOSPITAL_COMMUNITY): Payer: Medicare HMO | Admitting: Physical Therapy

## 2022-02-24 ENCOUNTER — Telehealth: Payer: Self-pay

## 2022-02-24 DIAGNOSIS — R293 Abnormal posture: Secondary | ICD-10-CM

## 2022-02-24 DIAGNOSIS — M542 Cervicalgia: Secondary | ICD-10-CM

## 2022-02-24 MED ORDER — TRELEGY ELLIPTA 200-62.5-25 MCG/ACT IN AEPB: 1.0000 | INHALATION_SPRAY | Freq: Every day | RESPIRATORY_TRACT | 5 refills | Status: DC

## 2022-02-24 NOTE — Telephone Encounter (Signed)
Sent in trelelgy 200 lm explaing the change on pts voicemail

## 2022-02-24 NOTE — Therapy (Signed)
OUTPATIENT PHYSICAL THERAPY TREATMENT  Patient Name: [name] MRN: [id number] DOB:09-23-1950, 72 y.o., female Today's Date: 02/24/2022   PT End of Session - 02/24/22 1041     Visit Number 8    Number of Visits 12    Date for PT Re-Evaluation 03/11/22    Authorization Type [employer]    Authorization Time Period 12 visits approved 7/13-8/24    Authorization - Visit Number 8    Authorization - Number of Visits 12    Progress Note Due on Visit 10    PT Start Time 1042    PT Stop Time 1112    PT Time Calculation (min) 30 min    Activity Tolerance Patient tolerated treatment well    Behavior During Therapy WFL for tasks assessed/performed                Past Medical History:  Diagnosis Date   Anxiety    Asthma    had 1 episode 1 year ago-no more problem   CHF (congestive heart failure) (HCC)    swelling of feet & ankles   Cough    Depression    OCD   GERD (gastroesophageal reflux disease)    severe   OCD (obsessive compulsive disorder)    Shortness of breath    with exertion   Yeast infection 04/09/2014   Past Surgical History:  Procedure Laterality Date   CARPAL TUNNEL RELEASE  03/22/2012   Procedure: CARPAL TUNNEL RELEASE;  Surgeon: Wynonia Sours, MD;  Location: [place] [facility];  Service: Orthopedics;  Laterality: Right;  right carpal tunnel release   COLONOSCOPY  06/19/2012   Dr. Rourk:normal rectum and colon    ESOPHAGOGASTRODUODENOSCOPY N/A 12/26/2015   Dr. Gala Romney: normal    TONSILLECTOMY     TUBAL LIGATION     Patient Active Problem List   Diagnosis Date Noted   Hoarseness 03/14/2018   Perennial allergic rhinitis with a possible nonallergic component 10/04/2017   Moderate persistent asthma 10/04/2017   Oropharyngeal dysphagia 05/06/2017   Gastroesophageal reflux disease without esophagitis    Constipation 11/26/2015   Yeast infection 04/09/2014   Obesity (BMI 30-39.9) 01/31/2014   Cough, persistent    CHF (congestive heart failure) ([place])     Depression    Anxiety    Encounter for screening colonoscopy 05/28/2012   GERD (gastroesophageal reflux disease) 05/28/2012   Low back pain 06/17/2011    PCP: Asencion Noble MD  REFERRING PROVIDER: Vallarie Mare, MD   REFERRING DIAG: [phone] Cervical Spondylosis   THERAPY DIAG:  Cervicalgia  Abnormal posture  Rationale for Evaluation and Treatment Rehabilitation  ONSET DATE: Chronic  SUBJECTIVE:  SUBJECTIVE STATEMENT: Patient states she had to do some work yesterday involving lifting. Her back is bothering her today.   PERTINENT HISTORY:   Cervical Pain  PAIN:  Are you having pain? Yes: NPRS scale: 6/10 Pain location: low back Pain description: sore/ stiff Aggravating factors: bending, lifting, standing Relieving factors: sitting, rest     PRECAUTIONS: None  WEIGHT BEARING RESTRICTIONS No  FALLS:  Has patient fallen in last 6 months? 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 Mild-to-moderate multilevel cervical spondylosis as described above.    PATIENT SURVEYS:  FOTO 37% function    COGNITION: Overall cognitive status: Within functional limits for tasks assessed   SENSATION: WFL  POSTURE: forward head  PALPATION: Max TTP / hyper sensitivity to LT upper trap/ scapular area    CERVICAL ROM:   Active ROM A/PROM (deg) eval  Flexion WFL  Extension 5  Right lateral flexion   Left lateral flexion   Right rotation 48  Left  rotation 55   (Blank rows = not tested)  UPPER EXTREMITY ROM:  Min restriction in bilat shoulder flexion and IR due to pain   UPPER EXTREMITY MMT:  MMT Right eval Left eval Right 02/08/22 Left 02/08/22  Shoulder flexion 3- 3- 4 4  Shoulder extension      Shoulder abduction 3- 3- 4- 4-  Shoulder adduction      Shoulder extension      Shoulder internal rotation 3+ 3+    Shoulder external rotation 3+ 3+     (Blank rows = not tested)    TODAY'S TREATMENT:  02/24/22 UBE 2/2 Lv 1  Chin tuck 10 x 3"   Seated upper trap stretch 2 x 30"   Seated levator stretch 2 x 30"  Band rows GTB 20 x 3" Shoulder extension GTB 20 x 3" Shoulder horiz abduction GTB 2 x 10  Shoulder ER GTB 20 x 3"  Wall slides 10 x 3"  Pec stretch  3 x 20"  Wall push up 2  x10  02/23/20 UBE 2/2 Lv 1  Chin tuck 10 x 3"  Band rows GTB 20 x 3" Shoulder extension GTB 20 x 3" Shoulder ER GTB 20 x 3"  Wall slides 10 x 3"  Pec stretch  3 x 20"   Bicep curl 4# 2 x 10 Hammer curls 4# 2 x 10  IASTM to bilateral upper trap (seated)   02/15/22 Seated: STM to bilateral upper traps and cervical spine x 10' to decrease pain and improve mobility; not other treatments performed during manual treatment. Cervical retractions x 10 Scapular retractions x 10  Bicep curl 4# 2 x 10 Hammer curls 4# 2 x 10  UBE 2' forward, 2' back  GTB shoulder extensions and rows 2 x 10 RTB overdoor retractions 2 x 10 RTB tricep press 2 x 10   02/10/22 Seated: STM to bilateral upper traps and cervical spine x 10' to decrease pain and improve mobility; not other treatments performed during manual treatment. Cervical retractions x 10 Scapular retractions x 10 Cervical AROM x 10 each  UBE 2' forward, 2' back  RTB scap retractions 2 x 10 RTB shoulder extensions 2 x 10  Bicep curl 3# 2 x 10 Hammer curls 3# 2 x 10   02/08/22 Seated: Cervical retractions 5" hold x 10 Scapular retractions 5" hold x 10 Cervical AROM x 10  each Bicep curl 3# 2 x 10 Hammer curls 3# 2 x 10  Manual:  STM to bil upper traps and cervical spine x 10' to decrease pain and improve mobility; no other treatments performed during manual treatment  UBE 2' forward and 2' backwards    02/03/22: Standing:  Postural 3 RTB 10X each  (Scap retraction, rows, shoulder ext) Seated:  Chin tucks 10X5"  Scap retractions 10X5"   3D cervical ROM  UE bil shoulder flexion 10X  Shoulder abduction 10X  Shoulder ER/IR 3# each LE 10X   Bicep curls 3# 10X  Hammer curls 3# 10X each Manual:  STM to bil upper traps  02/01/22: Goal review Seated:  Chin tucks 10X  Scap retractions 10X5"   3D cervical ROM  UE bil shoulder flexion 10X  Shoulder abduction 10X  Shoulder ER/IR 3# each LE 10X   Bicep curls 3# 10X  Hammer curls 3# 10X each Manual:  STM to bil upper traps  01/28/22 Eval Chin tuck Scap retraction Cervical AROM   PATIENT EDUCATION:  Education details: updated hep on eval findings, POC and HEP Person educated: Patient Education method: Explanation Education comprehension: verbalized understanding   HOME EXERCISE PROGRAM: 02/15/2022 Access Code: [email or web address] URL: [email or web address] Date: 02/15/2022 Prepared by: AP - Rehab  Exercises - Seated Cervical Retraction  - 3 x daily - 7 x weekly - 1 sets - 10 reps - 3-5 second hold - Seated Scapular Retraction  - 3 x daily - 7 x weekly - 1 sets - 10 reps - 3-5 second hold - Seated Cervical Rotation AROM  - 3 x daily - 7 x weekly - 1 sets - 10 reps - 3-5 second hold - Scapular Retraction with Resistance  - 1 x daily - 7 x weekly - 3 sets - 10 reps - Shoulder extension with resistance - Neutral  - 1 x daily - 7 x weekly - 2 sets - 10 reps  02/01/22: 3D cervical excursions 10X each  Access Code: [email or web address] URL: [email or web address] Date: 01/28/2022 Prepared by: Josue Hector  Exercises - Seated Cervical Retraction  - 3 x daily - 7 x weekly - 1 sets -  10 reps - 3-5 second hold - Seated Scapular Retraction  - 3 x daily - 7 x weekly - 1 sets - 10 reps - 3-5 second hold - Seated Cervical Rotation AROM  - 3 x daily - 7 x weekly - 1 sets - 10 reps - 3-5 second hold  ASSESSMENT:  CLINICAL IMPRESSION: Continued with postural strength progression with added band horizontal abduction. Patient cued on proper form and body mechanics. Patient requires verbal cues for hold times on stretching for max benefit. Also cued on elbow and hand position during shoulder extension. Patient will continue to benefit from skilled therapy services to reduce remaining deficits and improve functional ability.     OBJECTIVE IMPAIRMENTS decreased activity tolerance, decreased ROM, decreased strength, increased fascial restrictions, impaired flexibility, impaired UE functional use, improper body mechanics, postural dysfunction, and pain.   ACTIVITY LIMITATIONS carrying, lifting, sleeping, reach over head, and hygiene/grooming  PARTICIPATION LIMITATIONS: meal prep, cleaning, laundry, driving, shopping, community activity, occupation, and yard work  PERSONAL FACTORS  None  are also affecting patient's functional outcome.   REHAB POTENTIAL: Good  CLINICAL DECISION MAKING: Stable/uncomplicated  EVALUATION  COMPLEXITY: Low   GOALS: SHORT TERM GOALS: Target date: 02/18/2022  Patient will be independent with initial HEP and self-management strategies to improve functional outcomes Baseline:  Goal status: MET    LONG TERM GOALS: Target date: 04/07/2022  Patient will be independent with advanced HEP and self-management strategies to improve functional outcomes Baseline:  Goal status: IN PROGRESS  2.  Patient will improve FOTO score to predicted value to indicate improvement in functional outcomes Baseline: 37% function Goal status: IN PROGRESS  3.  Patient will demo improved cervical extension by at least 10 degrees in order to improve ability to scan environment  for safety and perform [place] ADLS.  Baseline: See AROM Goal status: IN PROGRESS  4. Patient will report a decrease in neck pain to 0/10 at rest for improved quality of life and ability to perform UE ADLs  Baseline: 9/10 Goal status: IN PROGRESS   PLAN: PT FREQUENCY: 2x/week  PT DURATION: 6 weeks  PLANNED INTERVENTIONS: Therapeutic exercises, Therapeutic activity, Neuromuscular re-education, Balance training, Gait training, Patient/Family education, Joint manipulation, Joint mobilization, Stair training, Aquatic Therapy, Dry Needling, Electrical stimulation, Spinal manipulation, Spinal mobilization, Cryotherapy, Moist heat, scar mobilization, Taping, Traction, Ultrasound, Biofeedback, Ionotophoresis 71m/ml Dexamethasone, and Manual therapy.   PLAN FOR NEXT SESSION: Progress pain free cervical mobility and postural strengthening as tolerated. Manual STM as needed for pain.   10:46 AM, 02/24/22 CJosue HectorPT DPT  [facility] [phone]

## 2022-02-24 NOTE — Addendum Note (Signed)
Addended by: Felipa Emory on: 02/24/2022 03:08 PM   Modules accepted: Orders

## 2022-02-24 NOTE — Telephone Encounter (Signed)
Pts insurance instead of charging 45 for breztri is now charging  100 and she dose not want to change as it is helping but wanted to know what is closely related for a reasonable price

## 2022-02-26 ENCOUNTER — Telehealth (HOSPITAL_COMMUNITY): Payer: Self-pay

## 2022-02-26 NOTE — Telephone Encounter (Signed)
Pt l/m she can not come in today please cx she has another MD apptment that day 8/14.

## 2022-03-01 ENCOUNTER — Ambulatory Visit (INDEPENDENT_AMBULATORY_CARE_PROVIDER_SITE_OTHER): Payer: Medicare HMO | Admitting: *Deleted

## 2022-03-01 ENCOUNTER — Encounter (HOSPITAL_COMMUNITY): Payer: Medicare HMO

## 2022-03-01 DIAGNOSIS — J4550 Severe persistent asthma, uncomplicated: Secondary | ICD-10-CM | POA: Diagnosis not present

## 2022-03-03 ENCOUNTER — Ambulatory Visit (HOSPITAL_COMMUNITY): Payer: Medicare HMO | Admitting: Physical Therapy

## 2022-03-03 DIAGNOSIS — M542 Cervicalgia: Secondary | ICD-10-CM

## 2022-03-03 DIAGNOSIS — R293 Abnormal posture: Secondary | ICD-10-CM

## 2022-03-03 NOTE — Therapy (Signed)
OUTPATIENT PHYSICAL THERAPY TREATMENT  Patient Name: [name] MRN: [id number] DOB:May 18, 1951, 71 y.o., female Today's Date: 03/03/2022   PT End of Session - 03/03/22 0938     Visit Number 9    Number of Visits 12    Date for PT Re-Evaluation 03/11/22    Authorization Type [employer]    Authorization Time Period 12 visits approved 7/13-8/24    Authorization - Visit Number 9    Authorization - Number of Visits 12    Progress Note Due on Visit 10    PT Start Time 0945    PT Stop Time 1025    PT Time Calculation (min) 40 min    Activity Tolerance Patient tolerated treatment well    Behavior During Therapy WFL for tasks assessed/performed                Past Medical History:  Diagnosis Date   Anxiety    Asthma    had 1 episode 1 year ago-no more problem   CHF (congestive heart failure) (HCC)    swelling of feet & ankles   Cough    Depression    OCD   GERD (gastroesophageal reflux disease)    severe   OCD (obsessive compulsive disorder)    Shortness of breath    with exertion   Yeast infection 04/09/2014   Past Surgical History:  Procedure Laterality Date   CARPAL TUNNEL RELEASE  03/22/2012   Procedure: CARPAL TUNNEL RELEASE;  Surgeon: Wynonia Sours, MD;  Location: [facility];  Service: Orthopedics;  Laterality: Right;  right carpal tunnel release   COLONOSCOPY  06/19/2012   Dr. Rourk:normal rectum and colon    ESOPHAGOGASTRODUODENOSCOPY N/A 12/26/2015   Dr. Gala Romney: normal    TONSILLECTOMY     TUBAL LIGATION     Patient Active Problem List   Diagnosis Date Noted   Hoarseness 03/14/2018   Perennial allergic rhinitis with a possible nonallergic component 10/04/2017   Moderate persistent asthma 10/04/2017   Oropharyngeal dysphagia 05/06/2017   Gastroesophageal reflux disease without esophagitis    Constipation 11/26/2015   Yeast infection 04/09/2014   Obesity (BMI 30-39.9) 01/31/2014   Cough, persistent    CHF (congestive heart failure) ([place])     Depression    Anxiety    Encounter for screening colonoscopy 05/28/2012   GERD (gastroesophageal reflux disease) 05/28/2012   Low back pain 06/17/2011    PCP: Asencion Noble MD  REFERRING PROVIDER: Vallarie Mare, MD   REFERRING DIAG: [phone] Cervical Spondylosis   THERAPY DIAG:  Cervicalgia  Abnormal posture  Rationale for Evaluation and Treatment Rehabilitation  ONSET DATE: Chronic  SUBJECTIVE:  SUBJECTIVE STATEMENT: Patient says her neck is doing much better, her low back continues to bother her a little bit.   PERTINENT HISTORY:   Cervical Pain  PAIN:  Are you having pain? Yes: NPRS scale: 4/10 Pain location: low back Pain description: sore/ stiff Aggravating factors: bending, lifting, standing Relieving factors: sitting, rest     PRECAUTIONS: None  WEIGHT BEARING RESTRICTIONS No  FALLS:  Has patient fallen in last 6 months? 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 Mild-to-moderate multilevel cervical spondylosis as described above.    PATIENT SURVEYS:  FOTO 37% function    COGNITION: Overall cognitive status: Within functional limits for tasks assessed   SENSATION: WFL  POSTURE: forward head  PALPATION: Max TTP / hyper sensitivity to LT upper trap/ scapular area    CERVICAL ROM:   Active ROM A/PROM (deg) eval  Flexion WFL  Extension 5  Right lateral flexion   Left lateral flexion   Right rotation 48  Left rotation  55   (Blank rows = not tested)  UPPER EXTREMITY ROM:  Min restriction in bilat shoulder flexion and IR due to pain   UPPER EXTREMITY MMT:  MMT Right eval Left eval Right 02/08/22 Left 02/08/22  Shoulder flexion 3- 3- 4 4  Shoulder extension      Shoulder abduction 3- 3- 4- 4-  Shoulder adduction      Shoulder extension      Shoulder internal rotation 3+ 3+    Shoulder external rotation 3+ 3+     (Blank rows = not tested)    TODAY'S TREATMENT:  03/03/22 UBE 2/2 Lv 1  Chin tuck 10 x 3"   Seated upper trap stretch 2 x 30"   Seated levator stretch 2 x 30"  Band rows BTB 20 x 3" Shoulder extension BTB 20 x 3" Wall clock GTB x 10 each Shoulder ER GTB 20 x 3"  Standing shoulder flexion/ abduction with 1lb 2 x10 each  Pec stretch  4 x 20"  Wall push up 2  x10   02/24/22 UBE 2/2 Lv 1  Chin tuck 10 x 3"   Seated upper trap stretch 2 x 30"   Seated levator stretch 2 x 30"  Band rows GTB 20 x 3" Shoulder extension GTB 20 x 3" Shoulder horiz abduction GTB 2 x 10  Shoulder ER GTB 20 x 3"  Wall slides 10 x 3"  Pec stretch  3 x 20"  Wall push up 2  x10  02/23/20 UBE 2/2 Lv 1  Chin tuck 10 x 3"  Band rows GTB 20 x 3" Shoulder extension GTB 20 x 3" Shoulder ER GTB 20 x 3"  Wall slides 10 x 3"  Pec stretch  3 x 20"   Bicep curl 4# 2 x 10 Hammer curls 4# 2 x 10  IASTM to bilateral upper trap (seated)   02/15/22 Seated: STM to bilateral upper traps and cervical spine x 10' to decrease pain and improve mobility; not other treatments performed during manual treatment. Cervical retractions x 10 Scapular retractions x 10  Bicep curl 4# 2 x 10 Hammer curls 4# 2 x 10  UBE 2' forward, 2' back  GTB shoulder extensions and rows 2 x 10 RTB overdoor retractions 2 x 10 RTB tricep press 2 x 10   02/10/22 Seated: STM to bilateral upper traps and cervical spine x 10' to decrease pain and improve mobility; not other treatments performed during manual treatment. Cervical  retractions x 10 Scapular retractions x 10 Cervical AROM x 10 each  UBE 2' forward, 2' back  RTB scap retractions 2 x 10 RTB shoulder extensions 2 x 10  Bicep curl 3# 2 x 10 Hammer curls 3# 2 x 10   02/08/22 Seated: Cervical retractions 5" hold x 10 Scapular retractions 5" hold x 10 Cervical AROM x 10 each Bicep curl 3# 2 x 10 Hammer curls 3# 2 x 10  Manual:  STM to bil upper traps and cervical spine x 10' to decrease pain and improve mobility; no other treatments performed during manual treatment  UBE 2' forward and 2' backwards    02/03/22: Standing:  Postural 3 RTB 10X each  (Scap retraction, rows, shoulder ext) Seated:  Chin tucks 10X5"  Scap retractions 10X5"   3D cervical ROM  UE bil shoulder flexion 10X  Shoulder abduction 10X  Shoulder ER/IR 3# each LE 10X   Bicep curls 3# 10X  Hammer curls 3# 10X each Manual:  STM to bil upper traps  02/01/22: Goal review Seated:  Chin tucks 10X  Scap retractions 10X5"   3D cervical ROM  UE bil shoulder flexion 10X  Shoulder abduction 10X  Shoulder ER/IR 3# each LE 10X   Bicep curls 3# 10X  Hammer curls 3# 10X each Manual:  STM to bil upper traps  01/28/22 Eval Chin tuck Scap retraction Cervical AROM   PATIENT EDUCATION:  Education details: updated hep on eval findings, POC and HEP Person educated: Patient Education method: Explanation Education comprehension: verbalized understanding   HOME EXERCISE PROGRAM: 02/15/2022 Access Code: [email or web address] URL: [email or web address]  03/03/22  - Doorway Pec Stretch at 90 Degrees Abduction  - 2 x daily - 7 x weekly - 1 sets - 4 reps - 20-30 second hold - Shoulder External Rotation and Scapular Retraction with Resistance  - 2 x daily - 7 x weekly - 2 sets - 10 reps - Standing Shoulder Flexion to 90 Degrees with Dumbbells  - 2 x daily - 7 x weekly - 2 sets - 10 reps - Shoulder Abduction with Dumbbells - Thumbs Up  - 2 x daily - 7 x weekly - 2 sets - 10  reps  Date: 02/15/2022 Prepared by: [facility] - Rehab  Exercises - Seated Cervical Retraction  - 3 x daily - 7 x weekly - 1 sets - 10 reps - 3-5 second hold - Seated Scapular Retraction  - 3 x daily - 7 x weekly - 1 sets - 10 reps - 3-5 second hold - Seated Cervical Rotation AROM  - 3 x daily - 7 x weekly - 1 sets - 10 reps - 3-5 second hold - Scapular Retraction with Resistance  - 1 x daily - 7 x weekly - 3 sets - 10 reps - Shoulder extension with resistance - Neutral  - 1 x daily - 7 x weekly - 2 sets - 10 reps  02/01/22: 3D cervical excursions 10X each  Access Code: [email or web address] URL: [email or web address] Date: 01/28/2022 Prepared by: Josue Hector  Exercises - Seated Cervical Retraction  - 3 x daily - 7 x weekly - 1 sets - 10 reps - 3-5 second hold - Seated Scapular Retraction  - 3  x daily - 7 x weekly - 1 sets - 10 reps - 3-5 second hold - Seated Cervical Rotation AROM  - 3 x daily - 7 x weekly - 1 sets - 10 reps - 3-5 second hold  ASSESSMENT:  CLINICAL IMPRESSION: Patient tolerated session well. Increased band resistance and added wall clock for scapular strengthening. Patient showing improved postural awareness but does require ongoing verbal cues for proper body mechanics with scapular retraction movement during rows. Also demos difficulty with body mechanics during wall push up, requiring verbal cues to bend elbows and reduce compensation of neck. Patient will continue to benefit from skilled therapy services to reduce remaining deficits and improve functional ability.      OBJECTIVE IMPAIRMENTS decreased activity tolerance, decreased ROM, decreased strength, increased fascial restrictions, impaired flexibility, impaired UE functional use, improper body mechanics, postural dysfunction, and pain.   ACTIVITY LIMITATIONS carrying, lifting, sleeping, reach over head, and hygiene/grooming  PARTICIPATION LIMITATIONS: meal prep, cleaning, laundry, driving, shopping, community  activity, occupation, and yard work  PERSONAL FACTORS  None  are also affecting patient's functional outcome.   REHAB POTENTIAL: Good  CLINICAL DECISION MAKING: Stable/uncomplicated  EVALUATION COMPLEXITY: Low   GOALS: SHORT TERM GOALS: Target date: 02/18/2022  Patient will be independent with initial HEP and self-management strategies to improve functional outcomes Baseline:  Goal status: MET    LONG TERM GOALS: Target date: 04/14/2022  Patient will be independent with advanced HEP and self-management strategies to improve functional outcomes Baseline:  Goal status: IN PROGRESS  2.  Patient will improve FOTO score to predicted value to indicate improvement in functional outcomes Baseline: 37% function Goal status: IN PROGRESS  3.  Patient will demo improved cervical extension by at least 10 degrees in order to improve ability to scan environment for safety and perform OH ADLS.  Baseline: See AROM Goal status: IN PROGRESS  4. Patient will report a decrease in neck pain to 0/10 at rest for improved quality of life and ability to perform UE ADLs  Baseline: 9/10 Goal status: IN PROGRESS   PLAN: PT FREQUENCY: 2x/week  PT DURATION: 6 weeks  PLANNED INTERVENTIONS: Therapeutic exercises, Therapeutic activity, Neuromuscular re-education, Balance training, Gait training, Patient/Family education, Joint manipulation, Joint mobilization, Stair training, Aquatic Therapy, Dry Needling, Electrical stimulation, Spinal manipulation, Spinal mobilization, Cryotherapy, Moist heat, scar mobilization, Taping, Traction, Ultrasound, Biofeedback, Ionotophoresis 48m/ml Dexamethasone, and Manual therapy.   PLAN FOR NEXT SESSION: Reassess next visit.  9:39 AM, 03/03/22 CJosue HectorPT DPT  [facility] ([phone]

## 2022-03-08 ENCOUNTER — Encounter (HOSPITAL_COMMUNITY): Payer: Medicare HMO | Admitting: Physical Therapy

## 2022-03-10 ENCOUNTER — Ambulatory Visit: Payer: Medicare HMO | Admitting: Allergy

## 2022-03-10 ENCOUNTER — Encounter (HOSPITAL_COMMUNITY): Payer: Medicare HMO | Admitting: Physical Therapy

## 2022-03-17 ENCOUNTER — Ambulatory Visit (HOSPITAL_COMMUNITY): Payer: Medicare HMO | Admitting: Physical Therapy

## 2022-03-17 DIAGNOSIS — R293 Abnormal posture: Secondary | ICD-10-CM | POA: Diagnosis not present

## 2022-03-17 DIAGNOSIS — M542 Cervicalgia: Secondary | ICD-10-CM

## 2022-03-17 NOTE — Addendum Note (Signed)
Addended by: Josue Hector A on: 03/17/2022 03:05 PM   Modules accepted: Orders

## 2022-03-17 NOTE — Therapy (Addendum)
OUTPATIENT PHYSICAL THERAPY TREATMENT  Patient Name: [name] MRN: [id number] DOB:03-23-1951, 71 y.o., female Today's Date: 03/17/2022  PHYSICAL THERAPY DISCHARGE SUMMARY  Visits from Start of Care: 10  Current functional level related to goals / functional outcomes: See below   Remaining deficits: See below   Education / Equipment: See assessment    Patient agrees to discharge. Patient goals were met. Patient is being discharged due to meeting the stated rehab goals.   PT End of Session - 03/17/22 1426     Visit Number 10    Number of Visits 12    Date for PT Re-Evaluation 03/17/22    Authorization Type [employer]    Authorization Time Period 12 visits approved 7/13-8/24    Authorization - Visit Number 9    Authorization - Number of Visits 12    Progress Note Due on Visit 10    PT Start Time 1436    PT Stop Time 1503    PT Time Calculation (min) 27 min    Activity Tolerance Patient tolerated treatment well    Behavior During Therapy WFL for tasks assessed/performed                Past Medical History:  Diagnosis Date   Anxiety    Asthma    had 1 episode 1 year ago-no more problem   CHF (congestive heart failure) (HCC)    swelling of feet & ankles   Cough    Depression    OCD   GERD (gastroesophageal reflux disease)    severe   OCD (obsessive compulsive disorder)    Shortness of breath    with exertion   Yeast infection 04/09/2014   Past Surgical History:  Procedure Laterality Date   CARPAL TUNNEL RELEASE  03/22/2012   Procedure: CARPAL TUNNEL RELEASE;  Surgeon: Wynonia Sours, MD;  Location: [place];  Service: Orthopedics;  Laterality: Right;  right carpal tunnel release   COLONOSCOPY  06/19/2012   Dr. Rourk:normal rectum and colon    ESOPHAGOGASTRODUODENOSCOPY N/A 12/26/2015   Dr. Gala Romney: normal    TONSILLECTOMY     TUBAL LIGATION     Patient Active Problem List   Diagnosis Date Noted   Hoarseness 03/14/2018   Perennial allergic  rhinitis with a possible nonallergic component 10/04/2017   Moderate persistent asthma 10/04/2017   Oropharyngeal dysphagia 05/06/2017   Gastroesophageal reflux disease without esophagitis    Constipation 11/26/2015   Yeast infection 04/09/2014   Obesity (BMI 30-39.9) 01/31/2014   Cough, persistent    CHF (congestive heart failure) ([place])    Depression    Anxiety    Encounter for screening colonoscopy 05/28/2012   GERD (gastroesophageal reflux disease) 05/28/2012   Low back pain 06/17/2011    PCP: Asencion Noble MD  REFERRING PROVIDER: Vallarie Mare, MD   REFERRING DIAG: [phone] Cervical Spondylosis   THERAPY DIAG:  Cervicalgia  Abnormal posture  Rationale for Evaluation and Treatment Rehabilitation  ONSET DATE: Chronic  SUBJECTIVE:  SUBJECTIVE STATEMENT: Patient states she is doing much better. She went to the beach last week with no major issues. Pain is well managed about a 1 today. Compliant with HEP with no issues.   PERTINENT HISTORY:   Cervical Pain  PAIN:  Are you having pain? Yes: NPRS scale: 1/10 Pain location: neck Pain description: sore/ stiff Aggravating factors: bending, lifting, standing Relieving factors: sitting, rest     PRECAUTIONS: None  WEIGHT BEARING RESTRICTIONS No  FALLS:  Has patient fallen in last 6 months? 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 Mild-to-moderate multilevel cervical spondylosis as  described above.    PATIENT SURVEYS:  FOTO 87% function    COGNITION: Overall cognitive status: Within functional limits for tasks assessed   SENSATION: WFL  POSTURE: forward head  PALPATION: Min/ mod TTP to LT upper trap/ scapular area    CERVICAL ROM:   Active ROM A/PROM (deg) eval AROM deg  Flexion [facility]  Extension 5 30  Right lateral flexion    Left lateral flexion    Right rotation 48 65  Left rotation 55 59   (Blank rows = not tested)  UPPER EXTREMITY ROM:  Min restriction in bilat shoulder flexion and IR due to pain   UPPER EXTREMITY MMT:  MMT Right eval Left eval Right 02/08/22 Left 02/08/22 Right 03/17/22 Left 03/17/22  Shoulder flexion 3- 3- 4 4 4+ 4+  Shoulder extension        Shoulder abduction 3- 3- 4- 4- 4+ 4+  Shoulder adduction        Shoulder extension        Shoulder internal rotation 3+ 3+   5 5  Shoulder external rotation 3+ 3+   5 5   (Blank rows = not tested)    TODAY'S TREATMENT:  03/17/22 Reassess FOTO MMT AROM HEP review   03/03/22 UBE 2/2 Lv 1  Chin tuck 10 x 3"   Seated upper trap stretch 2 x 30"   Seated levator stretch 2 x 30"  Band rows BTB 20 x 3" Shoulder extension BTB 20 x 3" Wall clock GTB x 10 each Shoulder ER GTB 20 x 3"  Standing shoulder flexion/ abduction with 1lb 2 x10 each  Pec stretch  4 x 20"  Wall push up 2  x10   02/24/22 UBE 2/2 Lv 1  Chin tuck 10 x 3"   Seated upper trap stretch 2 x 30"   Seated levator stretch 2 x 30"  Band rows GTB 20 x 3" Shoulder extension GTB 20 x 3" Shoulder horiz abduction GTB 2 x 10  Shoulder ER GTB 20 x 3"  Wall slides 10 x 3"  Pec stretch  3 x 20"  Wall push up 2  x10  02/23/20 UBE 2/2 Lv 1  Chin tuck 10 x 3"  Band rows GTB 20 x 3" Shoulder extension GTB 20 x 3" Shoulder ER GTB 20 x 3"  Wall slides 10 x 3"  Pec stretch  3 x 20"   Bicep curl 4# 2 x 10 Hammer curls 4# 2 x 10  IASTM to bilateral upper trap (seated)     PATIENT EDUCATION:  Education details: updated hep on eval findings, POC and HEP Person educated: Patient Education method: Explanation Education comprehension: verbalized understanding   HOME EXERCISE PROGRAM: 02/15/2022 Access Code: [email or web address] URL: [email or web address]  03/03/22  - Doorway Pec Stretch at 90 Degrees Abduction  - 2 x daily - 7 x weekly - 1 sets - 4 reps - 20-30 second hold - Shoulder External Rotation and Scapular Retraction with Resistance  - 2 x daily - 7 x weekly - 2 sets - 10 reps - Standing Shoulder Flexion to 90 Degrees with Dumbbells  - 2 x daily - 7 x weekly - 2 sets - 10 reps - Shoulder Abduction with Dumbbells - Thumbs Up  - 2 x daily - 7 x weekly - 2 sets - 10 reps  Date: 02/15/2022 Prepared by: [facility] - Rehab  Exercises - Seated Cervical Retraction  - 3 x daily - 7 x weekly - 1 sets - 10 reps - 3-5 second hold - Seated Scapular Retraction  - 3 x daily - 7 x weekly - 1 sets - 10 reps - 3-5 second hold - Seated Cervical Rotation AROM  - 3 x daily - 7 x weekly - 1 sets - 10 reps - 3-5 second hold - Scapular Retraction with Resistance  - 1 x daily - 7 x weekly - 3 sets - 10 reps - Shoulder extension with resistance - Neutral  - 1 x daily - 7 x weekly - 2 sets - 10 reps  02/01/22: 3D cervical excursions 10X each  Access Code: [email or web address] URL: [email or web address] Date: 01/28/2022 Prepared by: Josue Hector  Exercises - Seated Cervical Retraction  - 3 x daily - 7 x weekly - 1 sets - 10 reps - 3-5 second hold - Seated Scapular Retraction  - 3 x daily - 7 x weekly - 1 sets - 10 reps - 3-5 second hold - Seated Cervical Rotation AROM  - 3 x daily - 7 x weekly - 1 sets - 10 reps - 3-5 second hold  ASSESSMENT:  CLINICAL IMPRESSION:  Patient has made very good progress to therapy goals and is currently with all LTG met. Patient demos improved strength and AROM, reports increased function and decreased pain levels. Patient will be DC today due to all goals  MET. Reviewed comprehensive HEP and answered all patient questions. Patient encouraged to follow up with therapy services with any further questions or concerns.     OBJECTIVE IMPAIRMENTS decreased activity tolerance, decreased ROM, decreased strength, increased fascial restrictions, impaired flexibility, impaired UE functional use, improper body mechanics, postural dysfunction, and pain.   ACTIVITY LIMITATIONS carrying, lifting, sleeping, reach over head, and hygiene/grooming  PARTICIPATION LIMITATIONS: meal prep, cleaning, laundry, driving, shopping, community activity, occupation, and yard work  PERSONAL FACTORS  None  are also affecting patient's functional outcome.   REHAB POTENTIAL: Good  CLINICAL DECISION MAKING: Stable/uncomplicated  EVALUATION COMPLEXITY: Low   GOALS: SHORT TERM GOALS: Target date: 02/18/2022  Patient will be independent with initial HEP and self-management strategies to improve functional outcomes Baseline:  Goal status: MET    LONG TERM GOALS: Target date: 04/28/2022  Patient will be independent with advanced HEP and self-management strategies to improve functional outcomes Baseline:  Goal status: MET  2.  Patient will improve FOTO score to predicted value to indicate improvement in functional outcomes Baseline: 87% function Goal status: MET  3.  Patient will demo improved cervical extension by at least 10 degrees in order to improve ability to scan environment for safety  and perform OH ADLS.  Baseline: See AROM Goal status: MET  4. Patient will report a decrease in neck pain to 0/10 at rest for improved quality of life and ability to perform UE ADLs  Baseline: 0-1/10 Goal status: Partially MET  PLAN: PT FREQUENCY: 2x/week  PT DURATION: 6 weeks  PLANNED INTERVENTIONS: Therapeutic exercises, Therapeutic activity, Neuromuscular re-education, Balance training, Gait training, Patient/Family education, Joint manipulation, Joint mobilization, Stair  training, Aquatic Therapy, Dry Needling, Electrical stimulation, Spinal manipulation, Spinal mobilization, Cryotherapy, Moist heat, scar mobilization, Taping, Traction, Ultrasound, Biofeedback, Ionotophoresis 42m/ml Dexamethasone, and Manual therapy.   PLAN FOR NEXT SESSION: DC to HEP   3:03 PM, 03/17/22 CJosue HectorPT DPT  Physical Therapist with [facility] [phone]

## 2022-03-24 DIAGNOSIS — D72819 Decreased white blood cell count, unspecified: Secondary | ICD-10-CM | POA: Diagnosis not present

## 2022-03-24 DIAGNOSIS — D696 Thrombocytopenia, unspecified: Secondary | ICD-10-CM | POA: Diagnosis not present

## 2022-03-29 ENCOUNTER — Other Ambulatory Visit (HOSPITAL_COMMUNITY): Payer: Self-pay | Admitting: Internal Medicine

## 2022-03-30 ENCOUNTER — Other Ambulatory Visit (HOSPITAL_COMMUNITY): Payer: Self-pay | Admitting: Internal Medicine

## 2022-03-30 DIAGNOSIS — N63 Unspecified lump in unspecified breast: Secondary | ICD-10-CM

## 2022-03-31 ENCOUNTER — Ambulatory Visit: Payer: Medicare HMO

## 2022-03-31 ENCOUNTER — Ambulatory Visit (INDEPENDENT_AMBULATORY_CARE_PROVIDER_SITE_OTHER): Payer: Medicare HMO | Admitting: *Deleted

## 2022-03-31 DIAGNOSIS — J4550 Severe persistent asthma, uncomplicated: Secondary | ICD-10-CM | POA: Diagnosis not present

## 2022-04-07 ENCOUNTER — Ambulatory Visit: Payer: Medicare HMO | Admitting: Allergy

## 2022-04-13 ENCOUNTER — Ambulatory Visit (HOSPITAL_COMMUNITY)
Admission: RE | Admit: 2022-04-13 | Discharge: 2022-04-13 | Disposition: A | Discharge status: Home / Self Care | Payer: Medicare HMO | Source: Ambulatory Visit | Attending: Internal Medicine | Admitting: Internal Medicine

## 2022-04-13 DIAGNOSIS — R922 Inconclusive mammogram: Secondary | ICD-10-CM | POA: Diagnosis not present

## 2022-04-13 DIAGNOSIS — N63 Unspecified lump in unspecified breast: Secondary | ICD-10-CM | POA: Insufficient documentation

## 2022-04-13 DIAGNOSIS — N6489 Other specified disorders of breast: Secondary | ICD-10-CM | POA: Diagnosis not present

## 2022-04-13 DIAGNOSIS — Z1239 Encounter for other screening for malignant neoplasm of breast: Secondary | ICD-10-CM | POA: Insufficient documentation

## 2022-04-16 DIAGNOSIS — D649 Anemia, unspecified: Secondary | ICD-10-CM | POA: Diagnosis not present

## 2022-04-16 DIAGNOSIS — J4520 Mild intermittent asthma, uncomplicated: Secondary | ICD-10-CM | POA: Diagnosis not present

## 2022-04-16 DIAGNOSIS — N390 Urinary tract infection, site not specified: Secondary | ICD-10-CM | POA: Diagnosis not present

## 2022-04-16 DIAGNOSIS — Z23 Encounter for immunization: Secondary | ICD-10-CM | POA: Diagnosis not present

## 2022-04-16 DIAGNOSIS — D696 Thrombocytopenia, unspecified: Secondary | ICD-10-CM | POA: Diagnosis not present

## 2022-04-17 ENCOUNTER — Ambulatory Visit: Admission: EM | Admit: 2022-04-17 | Discharge: 2022-04-17 | Payer: Medicare HMO

## 2022-04-17 ENCOUNTER — Emergency Department (HOSPITAL_COMMUNITY)
Admission: EM | Admit: 2022-04-17 | Discharge: 2022-04-17 | Disposition: A | Discharge status: Home / Self Care | Payer: Medicare HMO | Attending: Emergency Medicine | Admitting: Emergency Medicine

## 2022-04-17 ENCOUNTER — Other Ambulatory Visit: Payer: Self-pay

## 2022-04-17 ENCOUNTER — Encounter (HOSPITAL_COMMUNITY): Payer: Self-pay | Admitting: *Deleted

## 2022-04-17 ENCOUNTER — Emergency Department (HOSPITAL_COMMUNITY): Payer: Medicare HMO

## 2022-04-17 DIAGNOSIS — D72829 Elevated white blood cell count, unspecified: Secondary | ICD-10-CM | POA: Diagnosis not present

## 2022-04-17 DIAGNOSIS — M7989 Other specified soft tissue disorders: Secondary | ICD-10-CM | POA: Diagnosis present

## 2022-04-17 DIAGNOSIS — M25532 Pain in left wrist: Secondary | ICD-10-CM | POA: Insufficient documentation

## 2022-04-17 DIAGNOSIS — M7722 Periarthritis, left wrist: Secondary | ICD-10-CM | POA: Diagnosis not present

## 2022-04-17 DIAGNOSIS — R9431 Abnormal electrocardiogram [ECG] [EKG]: Secondary | ICD-10-CM | POA: Diagnosis not present

## 2022-04-17 DIAGNOSIS — I10 Essential (primary) hypertension: Secondary | ICD-10-CM | POA: Diagnosis not present

## 2022-04-17 LAB — CBC WITH DIFFERENTIAL/PLATELET
Abs Immature Granulocytes: 0.5 10*3/uL — ABNORMAL HIGH (ref 0.00–0.07)
Band Neutrophils: 12 %
Basophils Absolute: 0 10*3/uL (ref 0.0–0.1)
Basophils Relative: 0 %
Eosinophils Absolute: 0.2 10*3/uL (ref 0.0–0.5)
Eosinophils Relative: 1 %
HCT: 31.8 % — ABNORMAL LOW (ref 36.0–46.0)
Hemoglobin: 9.7 g/dL — ABNORMAL LOW (ref 12.0–15.0)
Lymphocytes Relative: 9 %
Lymphs Abs: 1.4 10*3/uL (ref 0.7–4.0)
MCH: 31.9 pg (ref 26.0–34.0)
MCHC: 30.5 g/dL (ref 30.0–36.0)
MCV: 104.6 fL — ABNORMAL HIGH (ref 80.0–100.0)
Metamyelocytes Relative: 3 %
Monocytes Absolute: 4.6 10*3/uL — ABNORMAL HIGH (ref 0.1–1.0)
Monocytes Relative: 29 %
Neutro Abs: 9.2 10*3/uL — ABNORMAL HIGH (ref 1.7–7.7)
Neutrophils Relative %: 46 %
Platelets: 128 10*3/uL — ABNORMAL LOW (ref 150–400)
RBC: 3.04 MIL/uL — ABNORMAL LOW (ref 3.87–5.11)
RDW: 17.9 % — ABNORMAL HIGH (ref 11.5–15.5)
WBC: 15.8 10*3/uL — ABNORMAL HIGH (ref 4.0–10.5)
nRBC: 0.3 % — ABNORMAL HIGH (ref 0.0–0.2)

## 2022-04-17 LAB — BASIC METABOLIC PANEL
Anion gap: 3 — ABNORMAL LOW (ref 5–15)
BUN: 12 mg/dL (ref 8–23)
CO2: 29 mmol/L (ref 22–32)
Calcium: 9.3 mg/dL (ref 8.9–10.3)
Chloride: 103 mmol/L (ref 98–111)
Creatinine, Ser: 1.03 mg/dL — ABNORMAL HIGH (ref 0.44–1.00)
GFR, Estimated: 58 mL/min — ABNORMAL LOW (ref >60)
Glucose, Bld: 118 mg/dL — ABNORMAL HIGH (ref 70–99)
Potassium: 4.1 mmol/L (ref 3.5–5.1)
Sodium: 135 mmol/L (ref 135–145)

## 2022-04-17 LAB — URIC ACID: Uric Acid, Serum: 5.4 mg/dL (ref 2.5–7.1)

## 2022-04-17 MED ORDER — OXYCODONE-ACETAMINOPHEN 5-325 MG PO TABS: 1.0000 | ORAL_TABLET | Freq: Four times a day (QID) | ORAL | 0 refills | Status: DC | PRN

## 2022-04-17 MED ORDER — SODIUM CHLORIDE 0.9 % IV BOLUS
500.0000 mL | Freq: Once | INTRAVENOUS | Status: AC
  Administered 2022-04-17: 500 mL via INTRAVENOUS

## 2022-04-17 MED ORDER — HYDROCODONE-ACETAMINOPHEN 5-325 MG PO TABS
1.0000 | ORAL_TABLET | Freq: Once | ORAL | Status: AC
  Administered 2022-04-17: 1 via ORAL
  Filled 2022-04-17: qty 1

## 2022-04-17 MED ORDER — NAPROXEN 500 MG PO TABS: 500.0000 mg | ORAL_TABLET | Freq: Two times a day (BID) | ORAL | 0 refills | Status: DC

## 2022-04-17 MED ORDER — CELECOXIB 100 MG PO CAPS: 200.0000 mg | ORAL_CAPSULE | Freq: Two times a day (BID) | ORAL | 0 refills | Status: DC

## 2022-04-17 NOTE — ED Provider Notes (Signed)
[facility] EMERGENCY DEPARTMENT Provider Note   CSN: [id number] Arrival date & time: 04/17/22  6644     History  Chief Complaint  Patient presents with   Arm Pain    [name] is a 71 y.o. female.  patient complains of swelling to her left wrist.  Patient has a history of hypertension.  The history is provided by the patient and medical records. No language interpreter was used.  Arm Pain This is a new problem. The current episode started 12 to 24 hours ago. The problem occurs constantly. The problem has not changed since onset.Pertinent negatives include no chest pain, no abdominal pain and no headaches. Nothing aggravates the symptoms. Nothing relieves the symptoms. She has tried nothing for the symptoms.       Home Medications Prior to Admission medications   Medication Sig Start Date End Date Taking? Authorizing Provider  naproxen (NAPROSYN) 500 MG tablet Take 1 tablet (500 mg total) by mouth 2 (two) times daily. 04/17/22  Yes Bethann Berkshire, MD  oxyCODONE-acetaminophen (PERCOCET/ROXICET) 5-325 MG tablet Take 1 tablet by mouth every 6 (six) hours as needed for severe pain. 04/17/22  Yes Bethann Berkshire, MD  albuterol (VENTOLIN HFA) 108 (90 Base) MCG/ACT inhaler INHALE 2 PUFFS BY MOUTH EVERY 6 HOURS AS NEEDED FOR SHORTNESS OF BREATH OR WHEEZING. 04/25/20   Marcelyn Bruins, MD  azelastine (ASTELIN) 0.1 % nasal spray SPRAY 1 TO 2 SPRAYS PER NOSTRIL TWICE DAILY. 08/10/19   Bobbitt, Heywood Iles, MD  benzonatate (TESSALON) 100 MG capsule Take 1-2 capsules (100-200 mg total) by mouth 3 (three) times daily as needed for cough. 10/31/21   Wallis Bamberg, PA-C  BREZTRI AEROSPHERE 160-9-4.8 MCG/ACT AERO INHALE 2 PUFFS BY MOUTH TWICE DAILY 10/02/21   Marcelyn Bruins, MD  cetirizine (ZYRTEC) 5 MG tablet Take 1 tablet (5 mg total) by mouth daily. 10/31/21   Wallis Bamberg, PA-C  ciclopirox (PENLAC) 8 % solution Apply topically at bedtime. Apply over nail and surrounding skin.  Apply daily over previous coat. After seven (7) days, may remove with alcohol and continue cycle. 01/25/22   Vivi Barrack, DPM  cimetidine (TAGAMET) 800 MG tablet TAKE 1/2 TABLET BY MOUTH ONCE OR TWICE DAILY AS NEEDED 04/13/19   Bobbitt, Heywood Iles, MD  dexlansoprazole (DEXILANT) 60 MG capsule Take 1 capsule (60 mg total) by mouth daily. Patient not taking: Reported on 09/03/2021 05/07/20   Marcelyn Bruins, MD  EPINEPHrine 0.3 mg/0.3 mL IJ SOAJ injection Inject 0.3 mg into the muscle as needed for anaphylaxis. 08/07/20   Alfonse Spruce, MD  FLUoxetine (PROZAC) 20 MG capsule Take 1 capsule by mouth daily.    [provider]  fluticasone (FLONASE) 50 MCG/ACT nasal spray Place 2 sprays into both nostrils 2 (two) times daily. 12/17/14   Storm Frisk, MD  Fluticasone-Umeclidin-Vilant (TRELEGY ELLIPTA) 200-62.5-25 MCG/ACT AEPB Inhale 1 puff into the lungs daily. 02/24/22   Marcelyn Bruins, MD  gabapentin (NEURONTIN) 300 MG capsule  08/10/18   [provider]  ipratropium (ATROVENT) 0.06 % nasal spray USE 2 SPRAYS IN EACH NOSTRIL THREE TIMES A DAY AS NEEDED. 10/02/21   Marcelyn Bruins, MD  montelukast (SINGULAIR) 10 MG tablet TAKE ONE TABLET BY MOUTH AT BEDTIME. 07/29/20   Padgett, Pilar Grammes, MD  nitrofurantoin (MACRODANTIN) 100 MG capsule Take 100 mg by mouth at bedtime.    [provider]  NON FORMULARY [facility]  Antifungal (nail)-#1    [provider]  NON FORMULARY [facility]  Anti-fungal (nail)-#1    [provider]  omeprazole (PRILOSEC) 20 MG capsule Take 1 capsule (20 mg total) by mouth daily. 01/11/20   Marcelyn Bruins, MD  predniSONE (DELTASONE) 20 MG tablet Day 1-3: Take 3 tablets daily. Day 4-6: Take 2 tablets daily. Day 7-9: Take 1 tablet daily. Take tablets daily with breakfast. 10/31/21   Wallis Bamberg, PA-C  Respiratory Therapy Supplies (FLUTTER) DEVI Use as directed  10/04/17   Bobbitt, Heywood Iles, MD  SF 5000 PLUS 1.1 % CREA dental cream USE AS DIRECTED TO BRUSH TEETH AT BEDTIME 01/29/19   [provider]  Spacer/Aero-Holding Chambers (AEROCHAMBER PLUS WITH MASK) inhaler 1 each by Other route See admin instructions. Use with inhaler as instructed. 02/20/20   Marcelyn Bruins, MD  triamcinolone cream (KENALOG) 0.1 %  03/11/20   [provider]      Allergies    Levonorgestrel-ethinyl estrad, Sulfa antibiotics, Sulfamethoxazole-trimethoprim, and Cephalosporins    Review of Systems   Review of Systems  Constitutional:  Negative for appetite change and fatigue.  HENT:  Negative for congestion, ear discharge and sinus pressure.   Eyes:  Negative for discharge.  Respiratory:  Negative for cough.   Cardiovascular:  Negative for chest pain.  Gastrointestinal:  Negative for abdominal pain and diarrhea.  Genitourinary:  Negative for frequency and hematuria.  Musculoskeletal:  Negative for back pain.       Left wrist pain  Skin:  Negative for rash.  Neurological:  Negative for seizures and headaches.  Psychiatric/Behavioral:  Negative for hallucinations.     Physical Exam Updated Vital Signs BP 119/65   Pulse 76   Temp 98.7 F (37.1 C) (Oral)   Resp 17   Ht 5\' 2"  (1.575 m)   Wt 61.7 kg   SpO2 90%   BMI 24.87 kg/m  Physical Exam Vitals and nursing note reviewed.  Constitutional:      Appearance: She is well-developed.  HENT:     Head: Normocephalic.     Nose: Nose normal.  Eyes:     General: No scleral icterus.    Conjunctiva/sclera: Conjunctivae normal.  Neck:     Thyroid: No thyromegaly.  Cardiovascular:     Rate and Rhythm: Normal rate and regular rhythm.     Heart sounds: No murmur heard.    No friction rub. No gallop.  Pulmonary:     Breath sounds: No stridor. No wheezing or rales.  Chest:     Chest wall: No tenderness.  Abdominal:     General: There is no distension.     Tenderness: There is no  abdominal tenderness. There is no rebound.  Musculoskeletal:     Cervical back: Neck supple.     Comments: Tender left wrist  Lymphadenopathy:     Cervical: No cervical adenopathy.  Skin:    Findings: No erythema or rash.  Neurological:     Mental Status: She is oriented to person, place, and time.     Motor: No abnormal muscle tone.     Coordination: Coordination normal.  Psychiatric:        Behavior: Behavior normal.     ED Results / Procedures / Treatments   Labs (all labs ordered are listed, but only abnormal results are displayed) Labs Reviewed  CBC WITH DIFFERENTIAL/PLATELET - Abnormal; Notable for the following components:      Result Value   WBC 15.8 (*)    RBC 3.04 (*)  Hemoglobin 9.7 (*)    HCT 31.8 (*)    MCV 104.6 (*)    RDW 17.9 (*)    Platelets 128 (*)    nRBC 0.3 (*)    Neutro Abs 9.2 (*)    Monocytes Absolute 4.6 (*)    Abs Immature Granulocytes 0.50 (*)    All other components within normal limits  BASIC METABOLIC PANEL - Abnormal; Notable for the following components:   Glucose, Bld 118 (*)    Creatinine, Ser 1.03 (*)    GFR, Estimated 58 (*)    Anion gap 3 (*)    All other components within normal limits  URIC ACID    EKG None  Radiology DG Wrist Complete Left  Result Date: 04/17/2022 CLINICAL DATA:  Left wrist pain and swelling. Limited range of motion. EXAM: LEFT WRIST - COMPLETE 3+ VIEW COMPARISON:  01/22/2013. FINDINGS: No fracture, no fracture or bone lesion. Wrist joints are normally spaced and aligned. No arthropathic changes. Diffuse surrounding soft tissue swelling. IMPRESSION: 1. Diffuse soft tissue swelling. 2. No fracture, bone lesion or joint abnormality. Electronically Signed   By: Amie Portland M.D.   On: 04/17/2022 09:53    Procedures Procedures    Medications Ordered in ED Medications  sodium chloride 0.9 % bolus 500 mL (0 mLs Intravenous Stopped 04/17/22 1235)  HYDROcodone-acetaminophen (NORCO/VICODIN) 5-325 MG per  tablet 1 tablet (1 tablet Oral Given 04/17/22 1134)    ED Course/ Medical Decision Making/ A&P                           Medical Decision Making Amount and/or Complexity of Data Reviewed Labs: ordered. Radiology: ordered.  Risk Prescription drug management.  This patient presents to the ED for concern of left wrist swollen, this involves an extensive number of treatment options, and is a complaint that carries with it a high risk of complications and morbidity.  The differential diagnosis includes gout, other inflammatory process,   Co morbidities that complicate the patient evaluation  Hypertension   Additional history obtained:  Additional history obtained from patient External records from outside source obtained and reviewed including hospital record   Lab Tests:  I Ordered, and personally interpreted labs.  The pertinent results include: CBC shows white count 15,000 and hemoglobin 9.7   Imaging Studies ordered:  I ordered imaging studies including left breast I independently visualized and interpreted imaging which showed diffuse soft tissue swelling I agree with the radiologist interpretation   Cardiac Monitoring: / EKG:  The patient was maintained on a cardiac monitor.  I personally viewed and interpreted the cardiac monitored which showed an underlying rhythm of: Normal sinus rhythm   Consultations Obtained:  No consultant  Problem List / ED Course / Critical interventions / Medication management  Hypertension wrist pain I ordered medication including hydrocodone for pain Reevaluation of the patient after these medicines showed that the patient improved I have reviewed the patients home medicines and have made adjustments as needed   Social Determinants of Health:  None   Test / Admission - Considered:  None    Patient with left wrist inflamed.  Suspect gout or inflammatory arthritic process.  She is started on Celebrex and Percocet and will  follow-up with orthopedics        Final Clinical Impression(s) / ED Diagnoses Final diagnoses:  Inflammation around wrist, left    Rx / DC Orders ED Discharge Orders  Ordered    naproxen (NAPROSYN) 500 MG tablet  2 times daily        04/17/22 1344    oxyCODONE-acetaminophen (PERCOCET/ROXICET) 5-325 MG tablet  Every 6 hours PRN        04/17/22 1344              Bethann Berkshire, MD 04/22/22 1156

## 2022-04-17 NOTE — ED Triage Notes (Signed)
Pt reports pain and swelling  in left hand x 2 days. Denies any fall. Pt reports she started feeling weak.

## 2022-04-17 NOTE — Discharge Instructions (Signed)
Follow-up with Dr. Aline Brochure this week.  Call Monday for an appointment.  Take the Percocet if he needs something for pain

## 2022-04-17 NOTE — ED Notes (Signed)
CMS and skin intact. ROM limited. Swelling and redness noted to L wrist. Pt guarding. TTP.

## 2022-04-17 NOTE — ED Notes (Signed)
Xray at BS 

## 2022-04-17 NOTE — ED Notes (Signed)
Patient refused EMS transport states son can take her to the ED

## 2022-04-17 NOTE — ED Notes (Signed)
Patient is being discharged from the Urgent Care and sent to the Emergency Department via private vehicle . Per PA, patient is in need of higher level of care due to low BP and low O2 sat. Patient is aware and verbalizes understanding of plan of care.  Vitals:   04/17/22 0830  BP: (!) 87/53  Pulse: 63  Resp: 18  Temp: 98.9 F (37.2 C)  SpO2: 91%

## 2022-04-17 NOTE — ED Triage Notes (Signed)
BIB family from [facility] from home for c/o L hand, wrist and arm pain. UCC called with concern for low BP and EKG change. Sx onset yesterday, verbalizes concern for gout. Alert, NAD, calm, interactive.

## 2022-04-17 NOTE — ED Notes (Signed)
EDP at BS 

## 2022-04-17 NOTE — ED Notes (Signed)
Ace wrap applied to left hand, patient tolerated well. Capillary refill WNL.

## 2022-04-17 NOTE — ED Notes (Signed)
Patient's medications faxed to pharmacy. Patient states pharmacy closed today at 2. Dr Roderic Palau aware.

## 2022-04-17 NOTE — ED Notes (Signed)
Report given to [facility] at [facility] ED

## 2022-04-18 MED FILL — Oxycodone w/ Acetaminophen Tab 5-325 MG: ORAL | Qty: 6 | Status: AC

## 2022-04-20 DIAGNOSIS — H8190 Unspecified disorder of vestibular function, unspecified ear: Secondary | ICD-10-CM | POA: Insufficient documentation

## 2022-04-21 ENCOUNTER — Ambulatory Visit
Admission: EM | Admit: 2022-04-21 | Discharge: 2022-04-21 | Disposition: A | Discharge status: Home / Self Care | Payer: Medicare HMO | Attending: Nurse Practitioner | Admitting: Nurse Practitioner

## 2022-04-21 ENCOUNTER — Other Ambulatory Visit: Payer: Self-pay

## 2022-04-21 ENCOUNTER — Encounter: Payer: Self-pay | Admitting: Emergency Medicine

## 2022-04-21 DIAGNOSIS — R300 Dysuria: Secondary | ICD-10-CM | POA: Insufficient documentation

## 2022-04-21 LAB — POCT URINALYSIS DIP (MANUAL ENTRY)
Bilirubin, UA: NEGATIVE
Glucose, UA: NEGATIVE mg/dL
Ketones, POC UA: NEGATIVE mg/dL
Leukocytes, UA: NEGATIVE
Nitrite, UA: NEGATIVE
Spec Grav, UA: 1.025 (ref 1.010–1.025)
Urobilinogen, UA: 1 E.U./dL
pH, UA: 5.5 (ref 5.0–8.0)

## 2022-04-21 NOTE — ED Triage Notes (Signed)
Pt reports decreased urinary output and dysuria x1 week. Pt denies any known fever, abd pain,chills, nausea.

## 2022-04-21 NOTE — Discharge Instructions (Addendum)
-  Your urinalysis does not suggest that you have a urinary tract infection.  As discussed, a urine culture has been ordered.  If the results of the culture are positive, you will be contacted and provided treatment. -Increase fluids.  Try to drink at least 6-8 8 ounce glasses of water daily. -Ibuprofen or Tylenol for pain, fever, or general discomfort. -Develop a toileting schedule that will allow you to toilet at least every 2 hours. -Follow-up in this clinic or with your primary care physician if you continue to experience symptoms.

## 2022-04-21 NOTE — ED Provider Notes (Signed)
[facility]    CSN: [id number] Arrival date & time: 04/21/22  0160      History   Chief Complaint Chief Complaint  Patient presents with   Dysuria    HPI [name] is a 71 y.o. female.   The history is provided by the patient.   Patient presents for complaints of burning with urination and decreased urinary output that started today.  Patient states that she has had intermittent burning with urination after she started a medication for her wrist.  She states that her symptoms do not always present each time with urination.  She states that this morning when she got up to urinate, she felt like she had to go but did not have much output.  Patient denies fever, chills, suprapubic pain, flank pain, low back pain, hematuria, or vaginal symptoms.  Patient states that she has a history of urinary tract infections, but has not had 1 in quite some time.  Patient states that she was seen in the emergency department on 04/17/2022 and had abnormal lab work.  States that she was referred to a hematologist, she is scheduled to follow-up within the next week.  Past Medical History:  Diagnosis Date   Anxiety    Asthma    had 1 episode 1 year ago-no more problem   CHF (congestive heart failure) (HCC)    swelling of feet & ankles   Cough    Depression    OCD   GERD (gastroesophageal reflux disease)    severe   OCD (obsessive compulsive disorder)    Shortness of breath    with exertion   Yeast infection 04/09/2014    Patient Active Problem List   Diagnosis Date Noted   Episodic recurrent vertigo 04/20/2022   Asthma 01/12/2021   Hoarseness 03/14/2018   Perennial allergic rhinitis with a possible nonallergic component 10/04/2017   Moderate persistent asthma 10/04/2017   Oropharyngeal dysphagia 05/06/2017   Gastroesophageal reflux disease without esophagitis    Constipation 11/26/2015   Yeast infection 04/09/2014   Obesity (BMI 30-39.9) 01/31/2014   Cough, persistent     CHF (congestive heart failure) ([place])    Depression    Anxiety    Encounter for screening colonoscopy 05/28/2012   GERD (gastroesophageal reflux disease) 05/28/2012   Low back pain 06/17/2011    Past Surgical History:  Procedure Laterality Date   CARPAL TUNNEL RELEASE  03/22/2012   Procedure: CARPAL TUNNEL RELEASE;  Surgeon: Wynonia Sours, MD;  Location: [facility];  Service: Orthopedics;  Laterality: Right;  right carpal tunnel release   COLONOSCOPY  06/19/2012   Dr. Rourk:normal rectum and colon    ESOPHAGOGASTRODUODENOSCOPY N/A 12/26/2015   Dr. Gala Romney: normal    TONSILLECTOMY     TUBAL LIGATION      OB History     Gravida  1   Para  1   Term      Preterm      AB      Living  1      SAB      IAB      Ectopic      Multiple      Live Births               Home Medications    Prior to Admission medications   Medication Sig Start Date End Date Taking? Authorizing Provider  albuterol (VENTOLIN HFA) 108 (90 Base) MCG/ACT inhaler INHALE 2 PUFFS BY MOUTH EVERY  6 HOURS AS NEEDED FOR SHORTNESS OF BREATH OR WHEEZING. 04/25/20   Kennith Gain, MD  azelastine (ASTELIN) 0.1 % nasal spray SPRAY 1 TO 2 SPRAYS PER NOSTRIL TWICE DAILY. 08/10/19   Bobbitt, Sedalia Muta, MD  benzonatate (TESSALON) 100 MG capsule Take 1-2 capsules (100-200 mg total) by mouth 3 (three) times daily as needed for cough. 10/31/21   Jaynee Eagles, PA-C  BREZTRI AEROSPHERE 160-9-4.8 MCG/ACT AERO INHALE 2 PUFFS BY MOUTH TWICE DAILY 10/02/21   Kennith Gain, MD  celecoxib (CELEBREX) 100 MG capsule Take 2 capsules (200 mg total) by mouth 2 (two) times daily. 04/17/22   Milton Ferguson, MD  cetirizine (ZYRTEC) 5 MG tablet Take 1 tablet (5 mg total) by mouth daily. 10/31/21   Jaynee Eagles, PA-C  ciclopirox (PENLAC) 8 % solution Apply topically at bedtime. Apply over nail and surrounding skin. Apply daily over previous coat. After seven (7) days, may remove with alcohol and continue  cycle. 01/25/22   Trula Slade, DPM  cimetidine (TAGAMET) 800 MG tablet TAKE 1/2 TABLET BY MOUTH ONCE OR TWICE DAILY AS NEEDED 04/13/19   Bobbitt, Sedalia Muta, MD  dexlansoprazole (DEXILANT) 60 MG capsule Take 1 capsule (60 mg total) by mouth daily. Patient not taking: Reported on 09/03/2021 05/07/20   Kennith Gain, MD  EPINEPHrine 0.3 mg/0.3 mL IJ SOAJ injection Inject 0.3 mg into the muscle as needed for anaphylaxis. 08/07/20   Valentina Shaggy, MD  FLUoxetine (PROZAC) 20 MG capsule Take 1 capsule by mouth daily.    [provider]  fluticasone (FLONASE) 50 MCG/ACT nasal spray Place 2 sprays into both nostrils 2 (two) times daily. 12/17/14   [name] Stain, MD  Fluticasone-Umeclidin-Vilant (TRELEGY ELLIPTA) 200-62.5-25 MCG/ACT AEPB Inhale 1 puff into the lungs daily. 02/24/22   Kennith Gain, MD  gabapentin (NEURONTIN) 300 MG capsule  08/10/18   [provider]  ipratropium (ATROVENT) 0.06 % nasal spray USE 2 SPRAYS IN EACH NOSTRIL THREE TIMES A DAY AS NEEDED. 10/02/21   Kennith Gain, MD  montelukast (SINGULAIR) 10 MG tablet TAKE ONE TABLET BY MOUTH AT BEDTIME. 07/29/20   Padgett, Rae Halsted, MD  nitrofurantoin (MACRODANTIN) 100 MG capsule Take 100 mg by mouth at bedtime.    [provider]  NON [facility]  Antifungal (nail)-#1    [provider]  NON FORMULARY [facility]  Anti-fungal (nail)-#1    [provider]  omeprazole (PRILOSEC) 20 MG capsule Take 1 capsule (20 mg total) by mouth daily. 01/11/20   Kennith Gain, MD  oxyCODONE-acetaminophen (PERCOCET/ROXICET) 5-325 MG tablet Take 1 tablet by mouth every 6 (six) hours as needed for severe pain. 04/17/22   Milton Ferguson, MD  oxyCODONE-acetaminophen (PERCOCET/ROXICET) 5-325 MG tablet Take 1 tablet by mouth every 6 (six) hours as needed for severe pain. 04/17/22   Milton Ferguson, MD  predniSONE (DELTASONE) 20 MG  tablet Day 1-3: Take 3 tablets daily. Day 4-6: Take 2 tablets daily. Day 7-9: Take 1 tablet daily. Take tablets daily with breakfast. 10/31/21   Jaynee Eagles, PA-C  Respiratory Therapy Supplies (FLUTTER) DEVI Use as directed 10/04/17   Bobbitt, Sedalia Muta, MD  SF 5000 PLUS 1.1 % CREA dental cream USE AS DIRECTED TO BRUSH TEETH AT BEDTIME 01/29/19   [provider]  Spacer/Aero-Holding Chambers (AEROCHAMBER PLUS WITH MASK) inhaler 1 each by Other route See admin instructions. Use with inhaler as instructed. 02/20/20   Kennith Gain, MD  triamcinolone cream (KENALOG)  0.1 %  03/11/20   [provider]    Family History Family History  Problem Relation Age of Onset   Asthma Mother    Macular degeneration Mother    Hypertension Mother    Alzheimer's disease Father    Other Sister        blood clots   Other Son        enlarged spleen   Diabetes Maternal Grandfather    Diabetes Paternal Grandfather    Colon cancer Neg Hx     Social History Social History   Tobacco Use   Smoking status: Never    Passive exposure: Yes   Smokeless tobacco: Never  Vaping Use   Vaping Use: Never used  Substance Use Topics   Alcohol use: No   Drug use: No     Allergies   Levonorgestrel-ethinyl estrad, Sulfa antibiotics, Sulfamethoxazole-trimethoprim, and Cephalosporins   Review of Systems Review of Systems Per HPI  Physical Exam Triage Vital Signs ED Triage Vitals  Enc Vitals Group     BP 04/21/22 0839 112/65     Pulse Rate 04/21/22 0839 75     Resp 04/21/22 0839 20     Temp 04/21/22 0839 98.1 F (36.7 C)     Temp Source 04/21/22 0839 Oral     SpO2 04/21/22 0839 96 %     Weight --      Height --      Head Circumference --      Peak Flow --      Pain Score 04/21/22 0840 2     Pain Loc --      Pain Edu? --      Excl. in [place]? --    No data found.  Updated Vital Signs BP 112/65 (BP Location: Right Arm)   Pulse 75   Temp 98.1 F (36.7 C) (Oral)   Resp 20    SpO2 96%   Visual Acuity Right Eye Distance:   Left Eye Distance:   Bilateral Distance:    Right Eye Near:   Left Eye Near:    Bilateral Near:     Physical Exam Vitals and nursing note reviewed.  Constitutional:      General: She is not in acute distress.    Appearance: Normal appearance.  HENT:     Head: Normocephalic.  Eyes:     Extraocular Movements: Extraocular movements intact.     Conjunctiva/sclera: Conjunctivae normal.     Pupils: Pupils are equal, round, and reactive to light.  Cardiovascular:     Rate and Rhythm: Regular rhythm.     Heart sounds: Normal heart sounds.  Pulmonary:     Effort: Pulmonary effort is normal. No respiratory distress.     Breath sounds: Normal breath sounds. No stridor. No wheezing, rhonchi or rales.  Abdominal:     General: Bowel sounds are normal.     Palpations: Abdomen is soft.     Tenderness: There is no abdominal tenderness. There is no right CVA tenderness or left CVA tenderness.  Musculoskeletal:     Cervical back: Normal range of motion.  Lymphadenopathy:     Cervical: No cervical adenopathy.  Skin:    General: Skin is warm and dry.  Neurological:     General: No focal deficit present.     Mental Status: She is alert and oriented to person, place, and time.  Psychiatric:        Mood and Affect: Mood normal.  Behavior: Behavior normal.      UC Treatments / Results  Labs (all labs ordered are listed, but only abnormal results are displayed) Labs Reviewed  POCT URINALYSIS DIP (MANUAL ENTRY) - Abnormal; Notable for the following components:      Result Value   Clarity, UA cloudy (*)    Blood, UA trace-intact (*)    Protein Ur, POC trace (*)    All other components within normal limits    EKG   Radiology No results found.  Procedures Procedures (including critical care time)  Medications Ordered in UC Medications - No data to display  Initial Impression / Assessment and Plan / UC Course  I have  reviewed the triage vital signs and the nursing notes.  Pertinent labs & imaging results that were available during my care of the patient were reviewed by me and considered in my medical decision making (see chart for details).  Patient presents for complaints of dysuria, and urinary urgency and decreased output.  On exam, patient's vital signs are stable, she is in no acute distress.  She has no CVA tenderness, flank tenderness, or suprapubic tenderness.  Her urinalysis does show trace protein and trace blood.  Negative for leukocytes and nitrates.  Patient was in agreement with waiting on the results of her urine culture to ensure that she does have a UTI.  In the interim, patient was given supportive care recommendations to include a toileting schedule, and increasing her water intake.  Patient was advised that if her urine culture result is positive, she will be contacted and provided treatment.  Patient verbalizes understanding.  All questions were answered. Final Clinical Impressions(s) / UC Diagnoses   Final diagnoses:  Dysuria     Discharge Instructions      -Your urinalysis does not suggest that you have a urinary tract infection.  As discussed, a urine culture has been ordered.  If the results of the culture are positive, you will be contacted and provided treatment. -Increase fluids.  Try to drink at least 6-8 8 ounce glasses of water daily. -Ibuprofen or Tylenol for pain, fever, or general discomfort. -Develop a toileting schedule that will allow you to toilet at least every 2 hours. -Follow-up in this clinic or with your primary care physician if you continue to experience symptoms.     ED Prescriptions   None    PDMP not reviewed this encounter.   Tish Men, NP 04/21/22 [phone]

## 2022-04-22 ENCOUNTER — Encounter: Payer: Self-pay | Admitting: Orthopedic Surgery

## 2022-04-22 ENCOUNTER — Ambulatory Visit: Payer: Medicare HMO | Admitting: Orthopedic Surgery

## 2022-04-22 ENCOUNTER — Telehealth: Payer: Self-pay | Admitting: *Deleted

## 2022-04-22 VITALS — BP 104/63 | HR 65 | Ht 62.0 in | Wt 143.0 lb

## 2022-04-22 DIAGNOSIS — M65932 Unspecified synovitis and tenosynovitis, left forearm: Secondary | ICD-10-CM

## 2022-04-22 DIAGNOSIS — M659 Synovitis and tenosynovitis, unspecified: Secondary | ICD-10-CM | POA: Diagnosis not present

## 2022-04-22 LAB — URINE CULTURE: Culture: NO GROWTH

## 2022-04-22 NOTE — Telephone Encounter (Signed)
        Patient  visited [place] on 04/17/2022  for pain   Telephone encounter attempt :  1st  A HIPAA compliant voice message was left requesting a return call.  Instructed patient to call back at [phone].  [place] [phone] [address] , [place] [address] Email : [email or web address]

## 2022-04-22 NOTE — Patient Instructions (Addendum)
Diagnosis   SYNOVITIS   WEAR BRACE FOR SUPPORT   ICE AS NEEDED   ALEVE OR ADVIL AND TYLENOL CAN BE USED FOR PAIN

## 2022-04-22 NOTE — Progress Notes (Signed)
EMERGENCY ROOM FOLLOW UP  NEW PROBLEM/PATIENT   Patient ID: [name], female   DOB: 1951-02-28, 71 y.o.   MRN: [id number]   SUMMARY AND PLAN:  71 year old female atraumatic onset pain left wrist thought to be synovitis  Emergency room record from (date) September 26 has been reviewed and this is included by reference and includes the review of systems with the following addition:   Chief Complaint  Patient presents with   Wrist Pain    Lt wrist pain DOI 04/17/22    HPI [name] is a 71 y.o. female.  71 year old female spontaneous swelling and pain left wrist after mowing the lawn.  She was given some NSAIDs some pain medication and a wrist wrap and comes in for evaluation  HPI as noted above pain has improved patient denies any trauma has no signs or history of rheumatoid arthritis  Allergies  Allergen Reactions   Levonorgestrel-Ethinyl Estrad Cough   Sulfa Antibiotics Other (See Comments)    Unknown- pt unsure of reaction; believes it may be nausea   Sulfamethoxazole-Trimethoprim Other (See Comments)   Cephalosporins Other (See Comments)    Other Reaction: Toxicity   Current Outpatient Medications  Medication Instructions   albuterol (VENTOLIN HFA) 108 (90 Base) MCG/ACT inhaler INHALE 2 PUFFS BY MOUTH EVERY 6 HOURS AS NEEDED FOR SHORTNESS OF BREATH OR WHEEZING.   azelastine (ASTELIN) 0.1 % nasal spray SPRAY 1 TO 2 SPRAYS PER NOSTRIL TWICE DAILY.   BREZTRI AEROSPHERE 160-9-4.8 MCG/ACT AERO INHALE 2 PUFFS BY MOUTH TWICE DAILY   celecoxib (CELEBREX) 200 mg, Oral, 2 times daily   cetirizine (ZYRTEC) 5 mg, Oral, Daily   ciclopirox (PENLAC) 8 % solution Topical, Daily at bedtime, Apply over nail and surrounding skin. Apply daily over previous coat. After seven (7) days, may remove with alcohol and continue cycle.   cimetidine (TAGAMET) 800 MG tablet TAKE 1/2 TABLET BY MOUTH ONCE OR TWICE DAILY AS NEEDED   Dexilant 60 mg, Oral, Daily   EPINEPHrine (EPI-PEN) 0.3 mg,  Intramuscular, As needed   FLUoxetine (PROZAC) 20 MG capsule 1 capsule, Daily   fluticasone (FLONASE) 50 MCG/ACT nasal spray 2 sprays, Each Nare, 2 times daily   Fluticasone-Umeclidin-Vilant (TRELEGY ELLIPTA) 200-62.5-25 MCG/ACT AEPB 1 puff, Inhalation, Daily   gabapentin (NEURONTIN) 300 MG capsule No dose, route, or frequency recorded.   ipratropium (ATROVENT) 0.06 % nasal spray USE 2 SPRAYS IN EACH NOSTRIL THREE TIMES A DAY AS NEEDED.   montelukast (SINGULAIR) 10 MG tablet TAKE ONE TABLET BY MOUTH AT BEDTIME.   nitrofurantoin (MACRODANTIN) 100 mg, Oral, Daily at bedtime   NON FORMULARY [facility]<BR><BR>Antifungal (nail)-#1    NON FORMULARY [facility]<BR><BR>Anti-fungal (nail)-#1   omeprazole (PRILOSEC) 20 mg, Oral, Daily   oxyCODONE-acetaminophen (PERCOCET/ROXICET) 5-325 MG tablet 1 tablet, Oral, Every 6 hours PRN   oxyCODONE-acetaminophen (PERCOCET/ROXICET) 5-325 MG tablet 1 tablet, Oral, Every 6 hours PRN   Respiratory Therapy Supplies (FLUTTER) DEVI Use as directed   SF 5000 PLUS 1.1 % CREA dental cream USE AS DIRECTED TO BRUSH TEETH AT BEDTIME   Spacer/Aero-Holding Chambers (AEROCHAMBER PLUS WITH MASK) inhaler 1 each, Other, See admin instructions, Use with inhaler as instructed.   triamcinolone cream (KENALOG) 0.1 % No dose, route, or frequency recorded.     Review of Systems Review of Systems no fever no chills no rash no erythema around the wrist   has a past medical history of Anxiety, Asthma, CHF (congestive heart failure) ([place]), Cough, Depression, GERD (gastroesophageal reflux disease), OCD (  obsessive compulsive disorder), Shortness of breath, and Yeast infection (04/09/2014).   Past Surgical History:  Procedure Laterality Date   CARPAL TUNNEL RELEASE  03/22/2012   Procedure: CARPAL TUNNEL RELEASE;  Surgeon: Wynonia Sours, MD;  Location: [place];  Service: Orthopedics;  Laterality: Right;  right carpal tunnel release   COLONOSCOPY  06/19/2012    Dr. Rourk:normal rectum and colon    ESOPHAGOGASTRODUODENOSCOPY N/A 12/26/2015   Dr. Gala Romney: normal    TONSILLECTOMY     TUBAL LIGATION      Family History  Problem Relation Age of Onset   Asthma Mother    Macular degeneration Mother    Hypertension Mother    Alzheimer's disease Father    Other Sister        blood clots   Other Son        enlarged spleen   Diabetes Maternal Grandfather    Diabetes Paternal Grandfather    Colon cancer Neg Hx     Social History Social History   Tobacco Use   Smoking status: Never    Passive exposure: Yes   Smokeless tobacco: Never  Vaping Use   Vaping Use: Never used  Substance Use Topics   Alcohol use: No   Drug use: No    Allergies  Allergen Reactions   Levonorgestrel-Ethinyl Estrad Cough   Sulfa Antibiotics Other (See Comments)    Unknown- pt unsure of reaction; believes it may be nausea   Sulfamethoxazole-Trimethoprim Other (See Comments)   Cephalosporins Other (See Comments)    Other Reaction: Toxicity    Current Outpatient Medications  Medication Sig Dispense Refill   dexlansoprazole (DEXILANT) 60 MG capsule Take 1 capsule (60 mg total) by mouth daily. 30 capsule 5   FLUoxetine (PROZAC) 20 MG capsule Take 1 capsule by mouth daily.     fluticasone (FLONASE) 50 MCG/ACT nasal spray Place 2 sprays into both nostrils 2 (two) times daily. 16 g 5   Fluticasone-Umeclidin-Vilant (TRELEGY ELLIPTA) 200-62.5-25 MCG/ACT AEPB Inhale 1 puff into the lungs daily. 28 each 5   gabapentin (NEURONTIN) 300 MG capsule      omeprazole (PRILOSEC) 20 MG capsule Take 1 capsule (20 mg total) by mouth daily. 30 capsule 3   albuterol (VENTOLIN HFA) 108 (90 Base) MCG/ACT inhaler INHALE 2 PUFFS BY MOUTH EVERY 6 HOURS AS NEEDED FOR SHORTNESS OF BREATH OR WHEEZING. 18 g 2   azelastine (ASTELIN) 0.1 % nasal spray SPRAY 1 TO 2 SPRAYS PER NOSTRIL TWICE DAILY. 30 mL 3   BREZTRI AEROSPHERE 160-9-4.8 MCG/ACT AERO INHALE 2 PUFFS BY MOUTH TWICE DAILY 10.7 g 11    celecoxib (CELEBREX) 100 MG capsule Take 2 capsules (200 mg total) by mouth 2 (two) times daily. 20 capsule 0   cetirizine (ZYRTEC) 5 MG tablet Take 1 tablet (5 mg total) by mouth daily. 30 tablet 0   ciclopirox (PENLAC) 8 % solution Apply topically at bedtime. Apply over nail and surrounding skin. Apply daily over previous coat. After seven (7) days, may remove with alcohol and continue cycle. (Patient not taking: Reported on 04/22/2022) 6.6 mL 2   cimetidine (TAGAMET) 800 MG tablet TAKE 1/2 TABLET BY MOUTH ONCE OR TWICE DAILY AS NEEDED (Patient not taking: Reported on 04/22/2022) 30 tablet 2   EPINEPHrine 0.3 mg/0.3 mL IJ SOAJ injection Inject 0.3 mg into the muscle as needed for anaphylaxis. 2 each 1   ipratropium (ATROVENT) 0.06 % nasal spray USE 2 SPRAYS IN EACH NOSTRIL THREE TIMES A DAY AS  NEEDED. 15 mL 11   montelukast (SINGULAIR) 10 MG tablet TAKE ONE TABLET BY MOUTH AT BEDTIME. 30 tablet 5   nitrofurantoin (MACRODANTIN) 100 MG capsule Take 100 mg by mouth at bedtime.     NON FORMULARY [facility]  Antifungal (nail)-#1     NON FORMULARY [facility]  Anti-fungal (nail)-#1     oxyCODONE-acetaminophen (PERCOCET/ROXICET) 5-325 MG tablet Take 1 tablet by mouth every 6 (six) hours as needed for severe pain. (Patient not taking: Reported on 04/22/2022) 20 tablet 0   oxyCODONE-acetaminophen (PERCOCET/ROXICET) 5-325 MG tablet Take 1 tablet by mouth every 6 (six) hours as needed for severe pain. (Patient not taking: Reported on 04/22/2022) 6 tablet 0   Respiratory Therapy Supplies (FLUTTER) DEVI Use as directed 1 each 3   SF 5000 PLUS 1.1 % CREA dental cream USE AS DIRECTED TO BRUSH TEETH AT BEDTIME (Patient not taking: Reported on 04/22/2022)     Spacer/Aero-Holding Chambers (AEROCHAMBER PLUS WITH MASK) inhaler 1 each by Other route See admin instructions. Use with inhaler as instructed. 1 each 1   triamcinolone cream (KENALOG) 0.1 %  (Patient not taking: Reported on 04/22/2022)      Current Facility-Administered Medications  Medication Dose Route Frequency Provider Last Rate Last Admin   tezepelumab-ekko (TEZSPIRE) 210 MG/1.91ML syringe 210 mg  210 mg Subcutaneous Q28 days Kennith Gain, MD   210 mg at 03/31/22 1153    Physical Exam BP 104/63   Pulse 65   Ht '5\' 2"'$  (1.575 m)   Wt 143 lb (64.9 kg)   BMI 26.16 kg/m  Body mass index is 26.16 kg/m.  Well developed and well nourished  Stands with normal weight bearing line  Alert and oriented x 3  Normal affect and mood   GAIT:  Normal  Left wrist skin is intact no rash there is tenderness over the joint not over the bone range of motion is minimally painful.  There is no signs of instability.  No atrophy.  Good pulse and normal sensation (STRIMVS)   Data Reviewed IMAGING From THE ER AND THE REPORT ARE REVIEWED, MY INTERPRETATION OF THE IMAGE(S) IS : Normal wrist joint there is a questionable cortical irregularity on the ulnar side of the wrist but clinically no pain or tenderness there  Assessment  Encounter Diagnosis  Name Primary?   Synovitis of left wrist Yes     Plan   Wrist splint, ice, NSAIDs over-the-counter return 4 weeks recheck presumed benign synovitis   Arther Abbott, MD 04/22/2022 1:53 PM

## 2022-04-26 ENCOUNTER — Encounter: Payer: Self-pay | Admitting: Hematology

## 2022-04-26 ENCOUNTER — Inpatient Hospital Stay: Payer: Medicare HMO

## 2022-04-26 ENCOUNTER — Inpatient Hospital Stay: Payer: Medicare HMO | Attending: Hematology | Admitting: Hematology

## 2022-04-26 VITALS — BP 118/72 | HR 65 | Temp 99.1°F | Resp 18 | Ht 60.83 in | Wt 140.4 lb

## 2022-04-26 DIAGNOSIS — D696 Thrombocytopenia, unspecified: Secondary | ICD-10-CM

## 2022-04-26 DIAGNOSIS — D539 Nutritional anemia, unspecified: Secondary | ICD-10-CM | POA: Diagnosis not present

## 2022-04-26 DIAGNOSIS — D72829 Elevated white blood cell count, unspecified: Secondary | ICD-10-CM | POA: Diagnosis not present

## 2022-04-26 LAB — RETICULOCYTES
Immature Retic Fract: 30.4 % — ABNORMAL HIGH (ref 2.3–15.9)
RBC.: 2.8 MIL/uL — ABNORMAL LOW (ref 3.87–5.11)
Retic Count, Absolute: 97.2 10*3/uL (ref 19.0–186.0)
Retic Ct Pct: 3.5 % — ABNORMAL HIGH (ref 0.4–3.1)

## 2022-04-26 LAB — CBC WITH DIFFERENTIAL/PLATELET
Abs Immature Granulocytes: 1.7 10*3/uL — ABNORMAL HIGH (ref 0.00–0.07)
Band Neutrophils: 13 %
Basophils Absolute: 0 10*3/uL (ref 0.0–0.1)
Basophils Relative: 0 %
Eosinophils Absolute: 0 10*3/uL (ref 0.0–0.5)
Eosinophils Relative: 0 %
HCT: 29.9 % — ABNORMAL LOW (ref 36.0–46.0)
Hemoglobin: 9.1 g/dL — ABNORMAL LOW (ref 12.0–15.0)
Lymphocytes Relative: 11 %
Lymphs Abs: 1.1 10*3/uL (ref 0.7–4.0)
MCH: 32.5 pg (ref 26.0–34.0)
MCHC: 30.4 g/dL (ref 30.0–36.0)
MCV: 106.8 fL — ABNORMAL HIGH (ref 80.0–100.0)
Metamyelocytes Relative: 10 %
Monocytes Absolute: 2.1 10*3/uL — ABNORMAL HIGH (ref 0.1–1.0)
Monocytes Relative: 21 %
Myelocytes: 7 %
Neutro Abs: 5.1 10*3/uL (ref 1.7–7.7)
Neutrophils Relative %: 38 %
Platelets: 146 10*3/uL — ABNORMAL LOW (ref 150–400)
RBC: 2.8 MIL/uL — ABNORMAL LOW (ref 3.87–5.11)
RDW: 17.7 % — ABNORMAL HIGH (ref 11.5–15.5)
WBC: 10 10*3/uL (ref 4.0–10.5)
nRBC: 0.2 % (ref 0.0–0.2)

## 2022-04-26 LAB — COMPREHENSIVE METABOLIC PANEL
ALT: 20 U/L (ref 0–44)
AST: 32 U/L (ref 15–41)
Albumin: 4 g/dL (ref 3.5–5.0)
Alkaline Phosphatase: 57 U/L (ref 38–126)
Anion gap: 6 (ref 5–15)
BUN: 16 mg/dL (ref 8–23)
CO2: 28 mmol/L (ref 22–32)
Calcium: 9.4 mg/dL (ref 8.9–10.3)
Chloride: 107 mmol/L (ref 98–111)
Creatinine, Ser: 1.03 mg/dL — ABNORMAL HIGH (ref 0.44–1.00)
GFR, Estimated: 58 mL/min — ABNORMAL LOW (ref >60)
Glucose, Bld: 97 mg/dL (ref 70–99)
Potassium: 3.9 mmol/L (ref 3.5–5.1)
Sodium: 141 mmol/L (ref 135–145)
Total Bilirubin: 1 mg/dL (ref 0.3–1.2)
Total Protein: 6.6 g/dL (ref 6.5–8.1)

## 2022-04-26 LAB — FOLATE: Folate: 4.3 ng/mL — ABNORMAL LOW (ref >5.9)

## 2022-04-26 LAB — LACTATE DEHYDROGENASE: LDH: 418 U/L — ABNORMAL HIGH (ref 98–192)

## 2022-04-26 LAB — IRON AND TIBC
Iron: 85 ug/dL (ref 28–170)
Saturation Ratios: 25 % (ref 10.4–31.8)
TIBC: 343 ug/dL (ref 250–450)
UIBC: 258 ug/dL

## 2022-04-26 LAB — FERRITIN: Ferritin: 114 ng/mL (ref 11–307)

## 2022-04-26 LAB — URIC ACID: Uric Acid, Serum: 7 mg/dL (ref 2.5–7.1)

## 2022-04-26 LAB — VITAMIN B12: Vitamin B-12: 313 pg/mL (ref 180–914)

## 2022-04-26 NOTE — Progress Notes (Signed)
CONSULT NOTE  Patient Care Team: Asencion Noble, MD as PCP - General (Internal Medicine) Gala Romney Cristopher Estimable, MD as Attending Physician (Gastroenterology) [name] Stain, MD as Consulting Physician (Pulmonary Disease) Derek Jack, MD as Medical Oncologist (Hematology)  CHIEF COMPLAINTS/PURPOSE OF CONSULTATION:  Abnormal CBC  HISTORY OF PRESENTING ILLNESS:  [name] 71 y.o. female is seen at the request of Dr. Willey Blade for further work-up and management of abnormal CBC.  CBC on 03/24/2022 showed hemoglobin 9.6 with 2% blasts and mild thrombocytopenia.  Prior to that CBC in April 2023 showed hemoglobin 11.  Her hemoglobin was completely normal in 2022.  She reports fatigue for the last few months.  She also lost about 15 to 20 pounds since March 2023.  Denies any fevers, night sweats or weight loss.  Reports decreased appetite.  She lives by herself and son lives next door.  Her son is accompanying her today.  She works part-time at [employer].  Prior to that she used to work in [place] for 30 years.  Non-smoker.  Maternal aunt had a lump in her breast.  2 maternal first cousins had lymphoma.  No leukemias in the family.  No chemical exposure.    MEDICAL HISTORY:  Past Medical History:  Diagnosis Date   Anxiety    Asthma    had 1 episode 1 year ago-no more problem   CHF (congestive heart failure) (HCC)    swelling of feet & ankles   Cough    Depression    OCD   GERD (gastroesophageal reflux disease)    severe   OCD (obsessive compulsive disorder)    Shortness of breath    with exertion   Yeast infection 04/09/2014    SURGICAL HISTORY: Past Surgical History:  Procedure Laterality Date   CARPAL TUNNEL RELEASE  03/22/2012   Procedure: CARPAL TUNNEL RELEASE;  Surgeon: Wynonia Sours, MD;  Location: [facility];  Service: Orthopedics;  Laterality: Right;  right carpal tunnel release   COLONOSCOPY  06/19/2012   Dr. Rourk:normal rectum and colon     ESOPHAGOGASTRODUODENOSCOPY N/A 12/26/2015   Dr. Gala Romney: normal    TONSILLECTOMY     TUBAL LIGATION      SOCIAL HISTORY: Social History   Socioeconomic History   Marital status: Widowed    Spouse name: Not on file   Number of children: 1   Years of education: Not on file   Highest education level: Not on file  Occupational History   Occupation: retired  Tobacco Use   Smoking status: Never    Passive exposure: Yes   Smokeless tobacco: Never  Vaping Use   Vaping Use: Never used  Substance and Sexual Activity   Alcohol use: No   Drug use: No   Sexual activity: Yes    Birth control/protection: Surgical    Comment: tubal  Other Topics Concern   Not on file  Social History Narrative   Not on file   Social Determinants of Health   Financial Resource Strain: Not on file  Food Insecurity: Not on file  Transportation Needs: Not on file  Physical Activity: Not on file  Stress: Not on file  Social Connections: Not on file  Intimate Partner Violence: Not on file    FAMILY HISTORY: Family History  Problem Relation Age of Onset   Asthma Mother    Macular degeneration Mother    Hypertension Mother    Alzheimer's disease Father    Other Sister  blood clots   Other Son        enlarged spleen   Diabetes Maternal Grandfather    Diabetes Paternal Grandfather    Colon cancer Neg Hx     ALLERGIES:  is allergic to levonorgestrel-ethinyl estrad, sulfa antibiotics, sulfamethoxazole-trimethoprim, and cephalosporins.  MEDICATIONS:  Current Outpatient Medications  Medication Sig Dispense Refill   albuterol (VENTOLIN HFA) 108 (90 Base) MCG/ACT inhaler INHALE 2 PUFFS BY MOUTH EVERY 6 HOURS AS NEEDED FOR SHORTNESS OF BREATH OR WHEEZING. 18 g 2   azelastine (ASTELIN) 0.1 % nasal spray SPRAY 1 TO 2 SPRAYS PER NOSTRIL TWICE DAILY. 30 mL 3   BREZTRI AEROSPHERE 160-9-4.8 MCG/ACT AERO INHALE 2 PUFFS BY MOUTH TWICE DAILY 10.7 g 11   celecoxib (CELEBREX) 100 MG capsule Take 2  capsules (200 mg total) by mouth 2 (two) times daily. 20 capsule 0   cetirizine (ZYRTEC) 5 MG tablet Take 1 tablet (5 mg total) by mouth daily. 30 tablet 0   ciclopirox (PENLAC) 8 % solution Apply topically at bedtime. Apply over nail and surrounding skin. Apply daily over previous coat. After seven (7) days, may remove with alcohol and continue cycle. 6.6 mL 2   cimetidine (TAGAMET) 800 MG tablet TAKE 1/2 TABLET BY MOUTH ONCE OR TWICE DAILY AS NEEDED 30 tablet 2   dexlansoprazole (DEXILANT) 60 MG capsule Take 1 capsule (60 mg total) by mouth daily. 30 capsule 5   EPINEPHrine 0.3 mg/0.3 mL IJ SOAJ injection Inject 0.3 mg into the muscle as needed for anaphylaxis. 2 each 1   FLUoxetine (PROZAC) 20 MG capsule Take 1 capsule by mouth daily.     fluticasone (FLONASE) 50 MCG/ACT nasal spray Place 2 sprays into both nostrils 2 (two) times daily. 16 g 5   Fluticasone-Umeclidin-Vilant (TRELEGY ELLIPTA) 200-62.5-25 MCG/ACT AEPB Inhale 1 puff into the lungs daily. 28 each 5   gabapentin (NEURONTIN) 300 MG capsule      ipratropium (ATROVENT) 0.06 % nasal spray USE 2 SPRAYS IN EACH NOSTRIL THREE TIMES A DAY AS NEEDED. 15 mL 11   montelukast (SINGULAIR) 10 MG tablet TAKE ONE TABLET BY MOUTH AT BEDTIME. 30 tablet 5   naproxen (NAPROSYN) 500 MG tablet Take 500 mg by mouth 2 (two) times daily.     NON FORMULARY [facility]  Antifungal (nail)-#1     NON FORMULARY [facility]  Anti-fungal (nail)-#1     omeprazole (PRILOSEC) 20 MG capsule Take 1 capsule (20 mg total) by mouth daily. 30 capsule 3   oxyCODONE-acetaminophen (PERCOCET/ROXICET) 5-325 MG tablet Take 1 tablet by mouth every 6 (six) hours as needed for severe pain. 20 tablet 0   Respiratory Therapy Supplies (FLUTTER) DEVI Use as directed 1 each 3   SF 5000 PLUS 1.1 % CREA dental cream      Spacer/Aero-Holding Chambers (AEROCHAMBER PLUS WITH MASK) inhaler 1 each by Other route See admin instructions. Use with inhaler as instructed. 1 each  1   triamcinolone cream (KENALOG) 0.1 %      Current Facility-Administered Medications  Medication Dose Route Frequency Provider Last Rate Last Admin   tezepelumab-ekko (TEZSPIRE) 210 MG/1.91ML syringe 210 mg  210 mg Subcutaneous Q28 days Kennith Gain, MD   210 mg at 03/31/22 1153    REVIEW OF SYSTEMS:   Constitutional: Denies fevers, chills or abnormal night sweats Eyes: Denies blurriness of vision, double vision or watery eyes Ears, nose, mouth, throat, and face: Denies mucositis or sore throat Respiratory: Positive for cough, shortness of breath  from asthma. Cardiovascular: Denies palpitation, chest discomfort or lower extremity swelling Gastrointestinal:  Denies nausea, heartburn or change in bowel habits Skin: Denies abnormal skin rashes Lymphatics: Denies new lymphadenopathy or easy bruising Neurological: Positive for numbness in the left hand fingers. Behavioral/Psych: Mood is stable, no new changes  All other systems were reviewed with the patient and are negative.  PHYSICAL EXAMINATION: ECOG PERFORMANCE STATUS: 1 - Symptomatic but completely ambulatory  Vitals:   04/26/22 0810  BP: 118/72  Pulse: 65  Resp: 18  Temp: 99.1 F (37.3 C)  SpO2: 95%   Filed Weights   04/26/22 0810  Weight: 140 lb 6.4 oz (63.7 kg)    GENERAL:alert, no distress and comfortable SKIN: skin color, texture, turgor are normal, no rashes or significant lesions EYES: normal, conjunctiva are pink and non-injected, sclera clear OROPHARYNX:no exudate, no erythema and lips, buccal mucosa, and tongue normal  NECK: supple, thyroid normal size, non-tender, without nodularity LYMPH:  no palpable lymphadenopathy in the cervical, axillary or inguinal LUNGS: clear to auscultation and percussion with normal breathing effort HEART: regular rate & rhythm and no murmurs and no lower extremity edema ABDOMEN:abdomen soft, non-tender and normal bowel sounds Musculoskeletal:no cyanosis of digits  and no clubbing  PSYCH: alert & oriented x 3 with fluent speech NEURO: no focal motor/sensory deficits  LABORATORY DATA:  I have reviewed the data as listed Recent Results (from the past 2160 hour(s))  CBC with Differential     Status: Abnormal   Collection Time: 04/17/22  9:33 AM  Result Value Ref Range   WBC 15.8 (H) 4.0 - 10.5 K/uL   RBC 3.04 (L) 3.87 - 5.11 MIL/uL   Hemoglobin 9.7 (L) 12.0 - 15.0 g/dL   HCT 31.8 (L) 36.0 - 46.0 %   MCV 104.6 (H) 80.0 - 100.0 fL   MCH 31.9 26.0 - 34.0 pg   MCHC 30.5 30.0 - 36.0 g/dL   RDW 17.9 (H) 11.5 - 15.5 %   Platelets 128 (L) 150 - 400 K/uL   nRBC 0.3 (H) 0.0 - 0.2 %   Neutrophils Relative % 46 %   Neutro Abs 9.2 (H) 1.7 - 7.7 K/uL   Band Neutrophils 12 %   Lymphocytes Relative 9 %   Lymphs Abs 1.4 0.7 - 4.0 K/uL   Monocytes Relative 29 %   Monocytes Absolute 4.6 (H) 0.1 - 1.0 K/uL   Eosinophils Relative 1 %   Eosinophils Absolute 0.2 0.0 - 0.5 K/uL   Basophils Relative 0 %   Basophils Absolute 0.0 0.0 - 0.1 K/uL   Smear Review MORPHOLOGY UNREMARKABLE    Metamyelocytes Relative 3 %   Abs Immature Granulocytes 0.50 (H) 0.00 - 0.07 K/uL   Stomatocytes PRESENT     Comment: Performed at [facility], [address], [place], [place] [address]  Basic metabolic panel     Status: Abnormal   Collection Time: 04/17/22  9:33 AM  Result Value Ref Range   Sodium 135 135 - 145 mmol/L   Potassium 4.1 3.5 - 5.1 mmol/L   Chloride 103 98 - 111 mmol/L   CO2 29 22 - 32 mmol/L   Glucose, Bld 118 (H) 70 - 99 mg/dL    Comment: Glucose reference range applies only to samples taken after fasting for at least 8 hours.   BUN 12 8 - 23 mg/dL   Creatinine, Ser 1.03 (H) 0.44 - 1.00 mg/dL   Calcium 9.3 8.9 - 10.3 mg/dL   GFR, Estimated 58 (L) >60  mL/min    Comment: (NOTE) Calculated using the CKD-EPI Creatinine Equation (2021)    Anion gap 3 (L) 5 - 15    Comment: Performed at [facility], [address], [place], [place] [address]  Uric acid      Status: None   Collection Time: 04/17/22  9:33 AM  Result Value Ref Range   Uric Acid, Serum 5.4 2.5 - 7.1 mg/dL    Comment: Performed at [facility], [address] [place], [place] [address]  POCT urinalysis dipstick     Status: Abnormal   Collection Time: 04/21/22  8:41 AM  Result Value Ref Range   Color, UA yellow yellow   Clarity, UA cloudy (A) clear   Glucose, UA negative negative mg/dL   Bilirubin, UA negative negative   Ketones, POC UA negative negative mg/dL   Spec Grav, UA 1.025 1.010 - 1.025   Blood, UA trace-intact (A) negative   pH, UA 5.5 5.0 - 8.0   Protein Ur, POC trace (A) negative mg/dL   Urobilinogen, UA 1.0 0.2 or 1.0 E.U./dL   Nitrite, UA Negative Negative   Leukocytes, UA Negative Negative  Urine Culture     Status: None   Collection Time: 04/21/22  9:29 AM   Specimen: Urine, Clean Catch  Result Value Ref Range   Specimen Description      URINE, CLEAN CATCH Performed at [facility], [address], [place], [place] [address]    Special Requests      NONE Performed at [facility], [address] [place], [place] [address]    Culture      NO GROWTH Performed at [facility], [place] [address], [place], [place] [address]    Report Status 04/22/2022 FINAL   Uric acid     Status: None   Collection Time: 04/26/22  9:22 AM  Result Value Ref Range   Uric Acid, Serum 7.0 2.5 - 7.1 mg/dL    Comment: Performed at [facility], [address] [place], [place] [address]  CBC with Differential     Status: Abnormal   Collection Time: 04/26/22  9:22 AM  Result Value Ref Range   WBC 10.0 4.0 - 10.5 K/uL   RBC 2.80 (L) 3.87 - 5.11 MIL/uL   Hemoglobin 9.1 (L) 12.0 - 15.0 g/dL   HCT 29.9 (L) 36.0 - 46.0 %   MCV 106.8 (H) 80.0 - 100.0 fL   MCH 32.5 26.0 - 34.0 pg   MCHC 30.4 30.0 - 36.0 g/dL   RDW 17.7 (H) 11.5 - 15.5 %   Platelets 146 (L) 150 - 400 K/uL   nRBC 0.2 0.0 - 0.2 %   Neutrophils Relative % 38 %   Neutro Abs 5.1 1.7 - 7.7 K/uL   Band  Neutrophils 13 %   Lymphocytes Relative 11 %   Lymphs Abs 1.1 0.7 - 4.0 K/uL   Monocytes Relative 21 %   Monocytes Absolute 2.1 (H) 0.1 - 1.0 K/uL   Eosinophils Relative 0 %   Eosinophils Absolute 0.0 0.0 - 0.5 K/uL   Basophils Relative 0 %   Basophils Absolute 0.0 0.0 - 0.1 K/uL   RBC Morphology ANISOCYTOSIS AND HYPOCHROMIA    Smear Review PLATELET COUNT CONFIRMED BY SMEAR     Comment: LARGE PLATELETS SEEN ON SMEAR   Metamyelocytes Relative 10 %   Myelocytes 7 %   Abs Immature Granulocytes 1.70 (H) 0.00 - 0.07 K/uL   Polychromasia PRESENT     Comment:  Performed at [facility], [address], [place], [place] [address]  Reticulocytes     Status: Abnormal   Collection Time: 04/26/22  9:22 AM  Result Value Ref Range   Retic Ct Pct 3.5 (H) 0.4 - 3.1 %   RBC. 2.80 (L) 3.87 - 5.11 MIL/uL   Retic Count, Absolute 97.2 19.0 - 186.0 K/uL   Immature Retic Fract 30.4 (H) 2.3 - 15.9 %    Comment: Performed at [facility], [address], [place], [place] [address]  Lactate dehydrogenase     Status: Abnormal   Collection Time: 04/26/22  9:22 AM  Result Value Ref Range   LDH 418 (H) 98 - 192 U/L    Comment: Performed at [facility], [address] [place], [place] [address]  Folate     Status: Abnormal   Collection Time: 04/26/22  9:22 AM  Result Value Ref Range   Folate 4.3 (L) >5.9 ng/mL    Comment: Performed at [facility], [address], [place], [place] [address]  Vitamin B12     Status: None   Collection Time: 04/26/22  9:22 AM  Result Value Ref Range   Vitamin B-12 313 180 - 914 pg/mL    Comment: (NOTE) This assay is not validated for testing neonatal or myeloproliferative syndrome specimens for Vitamin B12 levels. Performed at [facility], [address] [place], [place] [address]   Ferritin     Status: None   Collection Time: 04/26/22  9:22 AM  Result Value Ref Range   Ferritin 114 11 - 307 ng/mL    Comment: Performed at [facility], [address] [place], [place]  [address]  Iron and TIBC ([facility] ONLY)     Status: None   Collection Time: 04/26/22  9:22 AM  Result Value Ref Range   Iron 85 28 - 170 ug/dL   TIBC 343 250 - 450 ug/dL   Saturation Ratios 25 10.4 - 31.8 %   UIBC 258 ug/dL    Comment: Performed at [facility], [address], [place], [place] [address]  Comprehensive metabolic panel     Status: Abnormal   Collection Time: 04/26/22  9:22 AM  Result Value Ref Range   Sodium 141 135 - 145 mmol/L   Potassium 3.9 3.5 - 5.1 mmol/L   Chloride 107 98 - 111 mmol/L   CO2 28 22 - 32 mmol/L   Glucose, Bld 97 70 - 99 mg/dL    Comment: Glucose reference range applies only to samples taken after fasting for at least 8 hours.   BUN 16 8 - 23 mg/dL   Creatinine, Ser 1.03 (H) 0.44 - 1.00 mg/dL   Calcium 9.4 8.9 - 10.3 mg/dL   Total Protein 6.6 6.5 - 8.1 g/dL   Albumin 4.0 3.5 - 5.0 g/dL   AST 32 15 - 41 U/L   ALT 20 0 - 44 U/L   Alkaline Phosphatase 57 38 - 126 U/L   Total Bilirubin 1.0 0.3 - 1.2 mg/dL   GFR, Estimated 58 (L) >60 mL/min    Comment: (NOTE) Calculated using the CKD-EPI Creatinine Equation (2021)    Anion gap 6 5 - 15    Comment: Performed at [facility], [address], [place], [place] [address]    RADIOGRAPHIC STUDIES: I have personally reviewed the radiological images as listed and agreed with the findings in the report. DG Wrist Complete Left  Result Date: 04/17/2022 CLINICAL DATA:  Left wrist pain and swelling. Limited  range of motion. EXAM: LEFT WRIST - COMPLETE 3+ VIEW COMPARISON:  01/22/2013. FINDINGS: No fracture, no fracture or bone lesion. Wrist joints are normally spaced and aligned. No arthropathic changes. Diffuse surrounding soft tissue swelling. IMPRESSION: 1. Diffuse soft tissue swelling. 2. No fracture, bone lesion or joint abnormality. Electronically Signed   By: Lajean Manes M.D.   On: 04/17/2022 09:53   US BREAST LTD UNI LEFT INC AXILLA  Result Date: 04/13/2022 CLINICAL DATA:  71 year old  female with a palpable area of concern in the left breast. EXAM: DIGITAL DIAGNOSTIC BILATERAL MAMMOGRAM WITH TOMOSYNTHESIS; ULTRASOUND LEFT BREAST LIMITED TECHNIQUE: Bilateral digital diagnostic mammography and breast tomosynthesis was performed.; Targeted ultrasound examination of the left breast was performed. COMPARISON:  Previous exam(s). ACR Breast Density Category c: The breast tissue is heterogeneously dense, which may obscure small masses. FINDINGS: No suspicious masses or calcifications are seen in either breast. Spot compression tomograms were performed over the palpable area of concern in the left breast with no definite abnormality seen. Physical examination site of palpable concern in the slightly lower left breast reveals a soft mobile area of thickening. Targeted ultrasound of the left breast was performed. No suspicious masses or any other worrisome abnormalities seen, only normal-appearing fibroglandular tissue identified. IMPRESSION: 1. No mammographic or sonographic abnormalities at site of palpable concern in the left breast. 2.  No mammographic evidence of malignancy in either breast. RECOMMENDATION: 1. Recommend further management of the left breast palpable area of concern be based on clinical assessment. 2.  Screening mammogram in one year.(Code:SM-B-01Y) I have discussed the findings and recommendations with the patient. If applicable, a reminder letter will be sent to the patient regarding the next appointment. BI-RADS CATEGORY  1: Negative. Electronically Signed   By: Everlean Alstrom M.D.   On: 04/13/2022 15:14  MM DIAG BREAST TOMO BILATERAL  Result Date: 04/13/2022 CLINICAL DATA:  71 year old female with a palpable area of concern in the left breast. EXAM: DIGITAL DIAGNOSTIC BILATERAL MAMMOGRAM WITH TOMOSYNTHESIS; ULTRASOUND LEFT BREAST LIMITED TECHNIQUE: Bilateral digital diagnostic mammography and breast tomosynthesis was performed.; Targeted ultrasound examination of the left  breast was performed. COMPARISON:  Previous exam(s). ACR Breast Density Category c: The breast tissue is heterogeneously dense, which may obscure small masses. FINDINGS: No suspicious masses or calcifications are seen in either breast. Spot compression tomograms were performed over the palpable area of concern in the left breast with no definite abnormality seen. Physical examination site of palpable concern in the slightly lower left breast reveals a soft mobile area of thickening. Targeted ultrasound of the left breast was performed. No suspicious masses or any other worrisome abnormalities seen, only normal-appearing fibroglandular tissue identified. IMPRESSION: 1. No mammographic or sonographic abnormalities at site of palpable concern in the left breast. 2.  No mammographic evidence of malignancy in either breast. RECOMMENDATION: 1. Recommend further management of the left breast palpable area of concern be based on clinical assessment. 2.  Screening mammogram in one year.(Code:SM-B-01Y) I have discussed the findings and recommendations with the patient. If applicable, a reminder letter will be sent to the patient regarding the next appointment. BI-RADS CATEGORY  1: Negative. Electronically Signed   By: Everlean Alstrom M.D.   On: 04/13/2022 15:14    ASSESSMENT:  1.  Macrocytic anemia and thrombocytopenia: - Patient seen at the request of Dr. Willey Blade for abnormal CBC. - 04/17/2022: WBC 15.8, Hb-9.7, PLT-128 - 03/24/2022: WBC-8.1 (N-43%, L-17%, M-19%, B-2%), Hb-9.6, MCV-99, PLT-115 - Smear review: 13% metamyelocytes, 2% myelocytes, 2%  blasts,  - 10/19/2021: WBC-5.3 (N-61, L-21, M-10%), Hb-11, MCV-96, PLT-97  2.  Social/family history: - She lives at home by herself.  Son lives next door. - Does part-time work at [employer].  Worked in [place] for 30 years prior to retirement.  Non-smoker. - Maternal aunt had tumor in the breast, patient not certain if it is cancer.  2 maternal first  cousins had lymphoma.   PLAN:  1.  Abnormal CBC: - Physical examination today did not reveal any palpable masses or lymphadenopathy.  No palpable splenomegaly. - We will do anemia work-up with Q00, folic acid, MMA, copper, ferritin and iron panel along with repeating CBC. - We will check LDH and reticulocyte count and uric acid.  We will also check SPEP. - As her previous CBC showed 2% blasts and left shift, recommend flow cytometry. - RTC 1 week for follow-up.   All questions were answered. The patient knows to call the clinic with any problems, questions or concerns.     Derek Jack, MD 04/26/22 5:50 PM

## 2022-04-26 NOTE — Patient Instructions (Addendum)
[facility]  Discharge Instructions  You were seen and examined today by Dr. Delton Coombes. Dr. Delton Coombes is a hematologist, meaning that he specializes in blood abnormalities. Dr. Delton Coombes discussed your past medical history, family history of cancers/blood conditions and the events that led to you being here today.  You were referred to Dr. Delton Coombes by Dr. Willey Blade for anemia and thrombocytopenia (low platelets). Dr. Delton Coombes has recommended additional labs to evaluate further.  Please follow-up as scheduled.  Thank you for choosing [facility] to provide your oncology and hematology care.   To afford each patient quality time with our provider, please arrive at least 15 minutes before your scheduled appointment time. You may need to reschedule your appointment if you arrive late (10 or more minutes). Arriving late affects you and other patients whose appointments are after yours.  Also, if you miss three or more appointments without notifying the office, you may be dismissed from the clinic at the provider's discretion.    Again, thank you for choosing [facility].  Our hope is that these requests will decrease the amount of time that you wait before being seen by our physicians.   If you have a lab appointment with the [facility] please come in thru the Main Entrance and check in at the main information desk.           _____________________________________________________________  Should you have questions after your visit to [facility], please contact our office at [phone] and follow the prompts.  Our office hours are 8:00 a.m. to 4:30 p.m. Monday - Thursday and 8:00 a.m. to 2:30 p.m. Friday.  Please note that voicemails left after 4:00 p.m. may not be returned until the following business day.  We are closed weekends and all major holidays.  You do have access to a nurse 24-7, just call the main  number to the clinic [phone] and do not press any options, hold on the line and a nurse will answer the phone.    For prescription refill requests, have your pharmacy contact our office and allow 72 hours.    Masks are optional in the cancer centers. If you would like for your care team to wear a mask while they are taking care of you, please let them know. You may have one support person who is at least 71 years old accompany you for your appointments.

## 2022-04-27 LAB — KAPPA/LAMBDA LIGHT CHAINS
Kappa free light chain: 25.5 mg/L — ABNORMAL HIGH (ref 3.3–19.4)
Kappa, lambda light chain ratio: 1.52 (ref 0.26–1.65)
Lambda free light chains: 16.8 mg/L (ref 5.7–26.3)

## 2022-04-27 LAB — SURGICAL PATHOLOGY

## 2022-04-28 ENCOUNTER — Ambulatory Visit (INDEPENDENT_AMBULATORY_CARE_PROVIDER_SITE_OTHER): Payer: Medicare HMO

## 2022-04-28 DIAGNOSIS — J4550 Severe persistent asthma, uncomplicated: Secondary | ICD-10-CM

## 2022-04-28 LAB — METHYLMALONIC ACID, SERUM: Methylmalonic Acid, Quantitative: 304 nmol/L (ref 0–378)

## 2022-04-29 LAB — PROTEIN ELECTROPHORESIS, SERUM
A/G Ratio: 1.5 (ref 0.7–1.7)
Albumin ELP: 3.8 g/dL (ref 2.9–4.4)
Alpha-1-Globulin: 0.3 g/dL (ref 0.0–0.4)
Alpha-2-Globulin: 0.5 g/dL (ref 0.4–1.0)
Beta Globulin: 0.8 g/dL (ref 0.7–1.3)
Gamma Globulin: 0.9 g/dL (ref 0.4–1.8)
Globulin, Total: 2.5 g/dL (ref 2.2–3.9)
Total Protein ELP: 6.3 g/dL (ref 6.0–8.5)

## 2022-05-03 ENCOUNTER — Inpatient Hospital Stay: Payer: Medicare HMO | Admitting: Hematology

## 2022-05-03 ENCOUNTER — Ambulatory Visit: Payer: Medicare HMO | Admitting: Podiatry

## 2022-05-03 ENCOUNTER — Telehealth: Payer: Self-pay | Admitting: Podiatry

## 2022-05-03 VITALS — BP 116/75 | HR 75 | Temp 97.4°F | Resp 18 | Ht 60.0 in | Wt 141.8 lb

## 2022-05-03 DIAGNOSIS — M79674 Pain in right toe(s): Secondary | ICD-10-CM | POA: Diagnosis not present

## 2022-05-03 DIAGNOSIS — B351 Tinea unguium: Secondary | ICD-10-CM

## 2022-05-03 DIAGNOSIS — M79675 Pain in left toe(s): Secondary | ICD-10-CM

## 2022-05-03 DIAGNOSIS — D696 Thrombocytopenia, unspecified: Secondary | ICD-10-CM | POA: Diagnosis not present

## 2022-05-03 DIAGNOSIS — D539 Nutritional anemia, unspecified: Secondary | ICD-10-CM | POA: Diagnosis not present

## 2022-05-03 LAB — COPPER, SERUM: Copper: 101 ug/dL (ref 80–158)

## 2022-05-03 MED ORDER — FOLIC ACID 1 MG PO TABS: 1.0000 mg | ORAL_TABLET | Freq: Every day | ORAL | 5 refills | Status: DC

## 2022-05-03 NOTE — Telephone Encounter (Signed)
Pts son called stating pt called him because she did not have our number [place] [address] [place]. He is not sure where as he is not with her. I did tell him we have a 15 minute late policy. He stated pt really needed to be seen and I told him that we usually give pt 15 minutes.

## 2022-05-03 NOTE — Progress Notes (Unsigned)
Subjective: Chief Complaint  Patient presents with   Nail Problem    Nail fungus, [place]: topical antifungal,     71 year old female presents the office today for concerns of fungus on her toes particularly right second total nail.  The right 2nd can get more sore due to the hammertoe.  All the nails are thickened elongated causing discomfort.  No swelling redness or any drainage.  No open lesions.  Objective: AAO x3, NAD DP/PT pulses palpable bilaterally, CRT less than 3 seconds Nails are hypertrophic, dystrophic and discolored with incurvation of the nail borders.  There is tenderness nails 1-5 bilaterally however the right second toe is the most symptomatic in particular the right second nail is hypertrophic and dystrophic and is tender with trimming the nail.  There is no edema, erythema or signs of infection. No open lesions or pre-ulcerative lesions.  No pain with calf compression, swelling, warmth, erythema  Assessment: Symptomatic onychomycosis, right second toenail pain  Plan: -All treatment options discussed with the patient including all alternatives, risks, complications.  -Sharply debrided the toenails and complications or bleeding x 10.  Continue topical antifungal.  Refill of ciclopirox. -Consider removal of the right second digit toenail.  Discussed urea nail gel to help with the thickening as well as well as routine debridements for now. -Patient encouraged to call the office with any questions, concerns, change in symptoms.   Lorenda Peck DPM

## 2022-05-03 NOTE — Progress Notes (Signed)
[name], [name] [id number]   CLINIC:  Medical Oncology/Hematology  PCP:  [name], [place] [id number] [phone]   REASON FOR VISIT:  Follow-up for abnormal CBC.  PRIOR THERAPY: None.  NGS Results: Not applicable.  CURRENT THERAPY: Under work-up.  BRIEF ONCOLOGIC HISTORY:  Oncology History   No history exists.    CANCER STAGING: Cancer Staging  No matching staging information was found for the patient.   INTERVAL HISTORY:  [name] 71 y.o. female seen for follow-up of labs done for abnormal CBC.  She reports energy levels are 75%.  She was seen by me at the request of Dr. Willey [name] for management of abnormal CBC on 03/24/2022 which showed hemoglobin 9.6, mild thrombocytopenia and 2% blasts.  She does not report any recent infections or hospitalizations.    REVIEW OF SYSTEMS:  Review of Systems  Respiratory:  Positive for shortness of breath.   Gastrointestinal:  Positive for constipation.  Neurological:  Positive for numbness.  All other systems reviewed and are negative.    PAST MEDICAL/SURGICAL HISTORY:  Past Medical History:  Diagnosis Date   Anxiety    Asthma    had 1 episode 1 year ago-no more problem   CHF (congestive heart failure) (HCC)    swelling of feet & ankles   Cough    Depression    OCD   GERD (gastroesophageal reflux disease)    severe   OCD (obsessive compulsive disorder)    Shortness of breath    with exertion   Yeast infection 04/09/2014   Past Surgical History:  Procedure Laterality Date   CARPAL TUNNEL RELEASE  03/22/2012   Procedure: CARPAL TUNNEL RELEASE;  Surgeon: Wynonia Sours, MD;  Location: [facility];  Service: Orthopedics;  Laterality: Right;  right carpal tunnel release   COLONOSCOPY  06/19/2012   Dr. Rourk:normal rectum and colon    ESOPHAGOGASTRODUODENOSCOPY N/A 12/26/2015   Dr. Gala Romney: normal    TONSILLECTOMY     TUBAL LIGATION        SOCIAL HISTORY:  Social History   Socioeconomic History   Marital status: Widowed    Spouse name: Not on file   Number of children: 1   Years of education: Not on file   Highest education level: Not on file  Occupational History   Occupation: retired  Tobacco Use   Smoking status: Never    Passive exposure: Yes   Smokeless tobacco: Never  Vaping Use   Vaping Use: Never used  Substance and Sexual Activity   Alcohol use: No   Drug use: No   Sexual activity: Yes    Birth control/protection: Surgical    Comment: tubal  Other Topics Concern   Not on file  Social History Narrative   Not on file   Social Determinants of Health   Financial Resource Strain: Not on file  Food Insecurity: Not on file  Transportation Needs: Not on file  Physical Activity: Not on file  Stress: Not on file  Social Connections: Not on file  Intimate Partner Violence: Not on file    FAMILY HISTORY:  Family History  Problem Relation Age of Onset   Asthma Mother    Macular degeneration Mother    Hypertension Mother    Alzheimer's disease Father    Other Sister        blood clots   Other Son  enlarged spleen   Diabetes Maternal Grandfather    Diabetes Paternal Grandfather    Colon cancer Neg Hx     CURRENT MEDICATIONS:  Outpatient Encounter Medications as of 05/03/2022  Medication Sig   albuterol (VENTOLIN HFA) 108 (90 Base) MCG/ACT inhaler INHALE 2 PUFFS BY MOUTH EVERY 6 HOURS AS NEEDED FOR SHORTNESS OF BREATH OR WHEEZING.   azelastine (ASTELIN) 0.1 % nasal spray SPRAY 1 TO 2 SPRAYS PER NOSTRIL TWICE DAILY.   BREZTRI AEROSPHERE 160-9-4.8 MCG/ACT AERO INHALE 2 PUFFS BY MOUTH TWICE DAILY   celecoxib (CELEBREX) 100 MG capsule Take 2 capsules (200 mg total) by mouth 2 (two) times daily.   cetirizine (ZYRTEC) 5 MG tablet Take 1 tablet (5 mg total) by mouth daily.   ciclopirox (PENLAC) 8 % solution Apply topically at bedtime. Apply over nail and surrounding skin. Apply daily over  previous coat. After seven (7) days, may remove with alcohol and continue cycle.   cimetidine (TAGAMET) 800 MG tablet TAKE 1/2 TABLET BY MOUTH ONCE OR TWICE DAILY AS NEEDED   dexlansoprazole (DEXILANT) 60 MG capsule Take 1 capsule (60 mg total) by mouth daily.   EPINEPHrine 0.3 mg/0.3 mL IJ SOAJ injection Inject 0.3 mg into the muscle as needed for anaphylaxis.   FLUoxetine (PROZAC) 20 MG capsule Take 1 capsule by mouth daily.   fluticasone (FLONASE) 50 MCG/ACT nasal spray Place 2 sprays into both nostrils 2 (two) times daily.   Fluticasone-Umeclidin-Vilant (TRELEGY ELLIPTA) 200-62.5-25 MCG/ACT AEPB Inhale 1 puff into the lungs daily.   folic acid (FOLVITE) 1 MG tablet Take 1 tablet (1 mg total) by mouth daily.   gabapentin (NEURONTIN) 300 MG capsule    ipratropium (ATROVENT) 0.06 % nasal spray USE 2 SPRAYS IN EACH NOSTRIL THREE TIMES A DAY AS NEEDED.   montelukast (SINGULAIR) 10 MG tablet TAKE ONE TABLET BY MOUTH AT BEDTIME.   naproxen (NAPROSYN) 500 MG tablet Take 500 mg by mouth 2 (two) times daily.   NON FORMULARY [facility]  Antifungal (nail)-#1   NON FORMULARY [facility]  Anti-fungal (nail)-#1   omeprazole (PRILOSEC) 20 MG capsule Take 1 capsule (20 mg total) by mouth daily.   oxyCODONE-acetaminophen (PERCOCET/ROXICET) 5-325 MG tablet Take 1 tablet by mouth every 6 (six) hours as needed for severe pain.   Respiratory Therapy Supplies (FLUTTER) DEVI Use as directed   SF 5000 PLUS 1.1 % CREA dental cream    Spacer/Aero-Holding Chambers (AEROCHAMBER PLUS WITH MASK) inhaler 1 each by Other route See admin instructions. Use with inhaler as instructed.   triamcinolone cream (KENALOG) 0.1 %    Facility-Administered Encounter Medications as of 05/03/2022  Medication   tezepelumab-ekko (TEZSPIRE) 210 MG/1.91ML syringe 210 mg    ALLERGIES:  Allergies  Allergen Reactions   Levonorgestrel-Ethinyl Estrad Cough   Sulfa Antibiotics Other (See Comments)    Unknown- pt  unsure of reaction; believes it may be nausea   Sulfamethoxazole-Trimethoprim Other (See Comments)   Cephalosporins Other (See Comments)    Other Reaction: Toxicity     PHYSICAL EXAM:  ECOG Performance status: 1  Vitals:   05/03/22 1527  BP: 116/75  Pulse: 75  Resp: 18  Temp: (!) 97.4 F (36.3 C)  SpO2: 95%   Filed Weights   05/03/22 1527  Weight: 141 lb 12.8 oz (64.3 kg)   Physical Exam Vitals reviewed.  Constitutional:      Appearance: Normal appearance.  Cardiovascular:     Rate and Rhythm: Normal rate and regular rhythm.  Heart sounds: Normal heart sounds.  Pulmonary:     Breath sounds: Normal breath sounds.  Abdominal:     Palpations: Abdomen is soft. There is no mass.  Neurological:     Mental Status: She is alert.  Psychiatric:        Mood and Affect: Mood normal.        Behavior: Behavior normal.      LABORATORY DATA:  I have reviewed the labs as listed.  CBC    Component Value Date/Time   WBC 10.0 04/26/2022 0922   RBC 2.80 (L) 04/26/2022 0922   RBC 2.80 (L) 04/26/2022 0922   HGB 9.1 (L) 04/26/2022 0922   HGB 13.1 07/23/2020 1112   HCT 29.9 (L) 04/26/2022 0922   HCT 40.7 07/23/2020 1112   PLT 146 (L) 04/26/2022 0922   MCV 106.8 (H) 04/26/2022 0922   MCV 95 07/23/2020 1112   MCH 32.5 04/26/2022 0922   MCHC 30.4 04/26/2022 0922   RDW 17.7 (H) 04/26/2022 0922   RDW 14.2 07/23/2020 1112   LYMPHSABS 1.1 04/26/2022 0922   LYMPHSABS 0.7 07/23/2020 1112   MONOABS 2.1 (H) 04/26/2022 0922   EOSABS 0.0 04/26/2022 0922   EOSABS 0.1 07/23/2020 1112   BASOSABS 0.0 04/26/2022 0922   BASOSABS 0.0 07/23/2020 1112      Latest Ref Rng & Units 04/26/2022    9:22 AM 04/17/2022    9:33 AM 02/24/2019    2:15 PM  CMP  Glucose 70 - 99 mg/dL 97  118  97   BUN 8 - 23 mg/dL '16  12  20   '$ Creatinine 0.44 - 1.00 mg/dL 1.03  1.03  0.90   Sodium 135 - 145 mmol/L 141  135  137   Potassium 3.5 - 5.1 mmol/L 3.9  4.1  4.0   Chloride 98 - 111 mmol/L 107  103  104    CO2 22 - 32 mmol/L '28  29  25   '$ Calcium 8.9 - 10.3 mg/dL 9.4  9.3  9.3   Total Protein 6.5 - 8.1 g/dL 6.6   7.4   Total Bilirubin 0.3 - 1.2 mg/dL 1.0   1.0   Alkaline Phos 38 - 126 U/L 57   60   AST 15 - 41 U/L 32   25   ALT 0 - 44 U/L 20   17     DIAGNOSTIC IMAGING:  I have independently reviewed the scans and discussed with the patient.  ASSESSMENT: 1.  Macrocytic anemia and thrombocytopenia: - Patient seen at the request of Dr. Willey [name] for abnormal CBC. - 04/17/2022: WBC 15.8, Hb-9.7, PLT-128 - 03/24/2022: WBC-8.1 (N-43%, L-17%, M-19%, B-2%), Hb-9.6, MCV-99, PLT-115 - Smear review: 13% metamyelocytes, 2% myelocytes, 2% blasts,  - 10/19/2021: WBC-5.3 (N-61, L-21, M-10%), Hb-11, MCV-96, PLT-97   2.  Social/family history: - She lives at home by herself.  Son lives next door. - Does part-time work at [employer].  Worked in [employer] for 30 years prior to retirement.  Non-smoker. - Maternal aunt had tumor in the breast, patient not certain if it is cancer.  2 maternal first cousins had lymphoma.  Mother had stomach cancer.   PLAN:  1.  Macrocytic anemia and mild thrombocytopenia: - Reviewed labs from 04/26/2022 which showed LDH elevated at 418.  SPEP, B12, MMA, copper were normal.  Ferritin was 114 with percent saturation of 25. - She also has mild kidney disease with creatinine 1.03 and GFR 58 mL/min. - CBC showed  white count at 10, with increase in monocytes, metamyelocytes and myelocytes. - Flow cytometry showed 1% blasts with phenotype of myeloid series. - I have recommended bone marrow aspiration and biopsy based on these findings.  We will send for chromosome analysis.  We will do FISH panel based on final diagnosis. - RTC 7 to 10 days after biopsy.  2.  Folate deficiency: - Folic acid was low at 4.3.  We will start her on folic acid 1 mg tablet daily.   Orders placed this encounter:  Orders Placed This Encounter  Procedures   CT Biopsy   CT BONE MARROW  BIOPSY & ASPIRATION      Derek Jack, [place] [phone]

## 2022-05-03 NOTE — Patient Instructions (Signed)
You can use 'UREA NAIL GEL' on the right 2nd toenail

## 2022-05-03 NOTE — Patient Instructions (Addendum)
[facility]  Discharge Instructions  You were seen and examined today by Dr. Delton Coombes.  Dr. Delton Coombes discussed your most recent lab work which revealed that your folic acid is low. Dr. Delton Coombes sent in folic acid for you to take daily. Dr. Delton Coombes is going to order a bone marrow biopsy to see what is causing the kappa light chains to be abnormal.   Follow-up as scheduled after bone marrow biopsy.    Thank you for choosing [facility] to provide your oncology and hematology care.   To afford each patient quality time with our provider, please arrive at least 15 minutes before your scheduled appointment time. You may need to reschedule your appointment if you arrive late (10 or more minutes). Arriving late affects you and other patients whose appointments are after yours.  Also, if you miss three or more appointments without notifying the office, you may be dismissed from the clinic at the provider's discretion.    Again, thank you for choosing [facility].  Our hope is that these requests will decrease the amount of time that you wait before being seen by our physicians.   If you have a lab appointment with the [facility] please come in thru the Main Entrance and check in at the main information desk.           _____________________________________________________________  Should you have questions after your visit to [facility], please contact our office at [phone] and follow the prompts.  Our office hours are 8:00 a.m. to 4:30 p.m. Monday - Thursday and 8:00 a.m. to 2:30 p.m. Friday.  Please note that voicemails left after 4:00 p.m. may not be returned until the following business day.  We are closed weekends and all major holidays.  You do have access to a nurse 24-7, just call the main number to the clinic [phone] and do not press any options, hold on the line and a nurse will answer the  phone.    For prescription refill requests, have your pharmacy contact our office and allow 72 hours.    Masks are optional in the cancer centers. If you would like for your care team to wear a mask while they are taking care of you, please let them know. You may have one support person who is at least 72 years old accompany you for your appointments.

## 2022-05-10 LAB — FLOW CYTOMETRY

## 2022-05-18 NOTE — Progress Notes (Unsigned)
Follow-up visit  This is a 71 year old female presented with atraumatic onset of pain in her left wrist which we felt to be synovitis  She was advised to continue splinting ice over-the-counter anti-inflammatories and return in 4 weeks for recheck  STARTED 04/21/22  The synovitis has resolved but the patient is having some numbness and tingling in left thumb index long and part of the ring finger  She is already on once a day gabapentin  She says when she overworks the hand she seems to get the symptoms  Examination shows that the swelling we saw on the last visit has resolved she has good range of motion of the wrist but she does have some decreased sensation in the median nerve distribution has some pain up in the medial elbow and medial soft tissue in the flexor tendon group  We discussed possible treatment options  Unfortunately she has to have a bone biopsy to rule out leukemia so we are going to avoid any change in her medications and just have her wear the brace at night full-time and then during the day depending on her symptoms   Encounter Diagnoses  Name Primary?   Synovitis of left wrist Yes   Carpal tunnel syndrome of left wrist     Follow-up in 4 weeks if symptoms still remain then we can certainly do a nerve conduction study

## 2022-05-19 ENCOUNTER — Ambulatory Visit: Payer: Medicare HMO | Admitting: Orthopedic Surgery

## 2022-05-19 DIAGNOSIS — G5602 Carpal tunnel syndrome, left upper limb: Secondary | ICD-10-CM

## 2022-05-19 DIAGNOSIS — M659 Synovitis and tenosynovitis, unspecified: Secondary | ICD-10-CM | POA: Diagnosis not present

## 2022-05-24 ENCOUNTER — Other Ambulatory Visit: Payer: Self-pay | Admitting: Student

## 2022-05-24 DIAGNOSIS — D696 Thrombocytopenia, unspecified: Secondary | ICD-10-CM

## 2022-05-24 NOTE — H&P (Signed)
Chief Complaint: Patient was seen in consultation today for anemia, thrnombocytopenia at the request of Katragadda,Sreedhar  Referring Physician(s): Katragadda,Sreedhar  Supervising Physician: Markus Daft  Patient Status: [facility] - Out-pt  History of Present Illness: [name] is a 71 y.o. female was referred to hematology by Dr. Willey Blade for evaluation for abnormal CBC. CBC from 03/24/22 showed Hgb 9.6 with 2% blast and mild thrombocytopenia. Pt complained of fatigue, loss of appetite and 15-20 lb weight loss over 6 months. Most recent labs from 04/17/22 showed Hgb 9.7, PLT 128. Pt has been referred to IR for bone marrow biopsy by Dr. Delton Coombes.   Pt denies fever, chills, CP, SOB or loss of appetite.  She endorses fatigue, intermittent dizziness and bleed/bruising easily Pt states she is NPO per order  Past Medical History:  Diagnosis Date   Anxiety    Asthma    had 1 episode 1 year ago-no more problem   CHF (congestive heart failure) (HCC)    swelling of feet & ankles   Cough    Depression    OCD   GERD (gastroesophageal reflux disease)    severe   OCD (obsessive compulsive disorder)    Shortness of breath    with exertion   Yeast infection 04/09/2014    Past Surgical History:  Procedure Laterality Date   CARPAL TUNNEL RELEASE  03/22/2012   Procedure: CARPAL TUNNEL RELEASE;  Surgeon: Wynonia Sours, MD;  Location: [place];  Service: Orthopedics;  Laterality: Right;  right carpal tunnel release   COLONOSCOPY  06/19/2012   Dr. Rourk:normal rectum and colon    ESOPHAGOGASTRODUODENOSCOPY N/A 12/26/2015   Dr. Gala Romney: normal    TONSILLECTOMY     TUBAL LIGATION      Allergies: Levonorgestrel-ethinyl estrad, Sulfa antibiotics, Sulfamethoxazole-trimethoprim, and Cephalosporins  Medications: Prior to Admission medications   Medication Sig Start Date End Date Taking? Authorizing Provider  albuterol (VENTOLIN HFA) 108 (90 Base) MCG/ACT inhaler INHALE 2 PUFFS BY  MOUTH EVERY 6 HOURS AS NEEDED FOR SHORTNESS OF BREATH OR WHEEZING. 04/25/20   Kennith Gain, MD  azelastine (ASTELIN) 0.1 % nasal spray SPRAY 1 TO 2 SPRAYS PER NOSTRIL TWICE DAILY. 08/10/19   Bobbitt, Sedalia Muta, MD  BREZTRI AEROSPHERE 160-9-4.8 MCG/ACT AERO INHALE 2 PUFFS BY MOUTH TWICE DAILY 10/02/21   Kennith Gain, MD  celecoxib (CELEBREX) 100 MG capsule Take 2 capsules (200 mg total) by mouth 2 (two) times daily. 04/17/22   Milton Ferguson, MD  cetirizine (ZYRTEC) 5 MG tablet Take 1 tablet (5 mg total) by mouth daily. 10/31/21   Jaynee Eagles, PA-C  ciclopirox (PENLAC) 8 % solution Apply topically at bedtime. Apply over nail and surrounding skin. Apply daily over previous coat. After seven (7) days, may remove with alcohol and continue cycle. 01/25/22   Trula Slade, DPM  cimetidine (TAGAMET) 800 MG tablet TAKE 1/2 TABLET BY MOUTH ONCE OR TWICE DAILY AS NEEDED 04/13/19   Bobbitt, Sedalia Muta, MD  dexlansoprazole (DEXILANT) 60 MG capsule Take 1 capsule (60 mg total) by mouth daily. 05/07/20   Kennith Gain, MD  EPINEPHrine 0.3 mg/0.3 mL IJ SOAJ injection Inject 0.3 mg into the muscle as needed for anaphylaxis. 08/07/20   Valentina Shaggy, MD  FLUoxetine (PROZAC) 20 MG capsule Take 1 capsule by mouth daily.    [provider]  fluticasone (FLONASE) 50 MCG/ACT nasal spray Place 2 sprays into both nostrils 2 (two) times daily. 12/17/14   [name] Stain, MD  Fluticasone-Umeclidin-Vilant (TRELEGY ELLIPTA) 200-62.5-25 MCG/ACT AEPB Inhale 1 puff into the lungs daily. 02/24/22   Kennith Gain, MD  folic acid (FOLVITE) 1 MG tablet Take 1 tablet (1 mg total) by mouth daily. 05/03/22   Derek Jack, MD  gabapentin (NEURONTIN) 300 MG capsule  08/10/18   [provider]  ipratropium (ATROVENT) 0.06 % nasal spray USE 2 SPRAYS IN EACH NOSTRIL THREE TIMES A DAY AS NEEDED. 10/02/21   Kennith Gain, MD  montelukast (SINGULAIR) 10  MG tablet TAKE ONE TABLET BY MOUTH AT BEDTIME. 07/29/20   Padgett, Rae Halsted, MD  naproxen (NAPROSYN) 500 MG tablet Take 500 mg by mouth 2 (two) times daily. 04/19/22   [provider]  [facility]  Antifungal (nail)-#1    [provider]  [facility]  Anti-fungal (nail)-#1    [provider]  omeprazole (PRILOSEC) 20 MG capsule Take 1 capsule (20 mg total) by mouth daily. 01/11/20   Kennith Gain, MD  oxyCODONE-acetaminophen (PERCOCET/ROXICET) 5-325 MG tablet Take 1 tablet by mouth every 6 (six) hours as needed for severe pain. 04/17/22   Milton Ferguson, MD  Respiratory Therapy Supplies (FLUTTER) DEVI Use as directed 10/04/17   Bobbitt, Sedalia Muta, MD  SF 5000 PLUS 1.1 % CREA dental cream  01/29/19   [provider]  Spacer/Aero-Holding Chambers (AEROCHAMBER PLUS WITH MASK) inhaler 1 each by Other route See admin instructions. Use with inhaler as instructed. 02/20/20   Kennith Gain, MD  triamcinolone cream (KENALOG) 0.1 %  03/11/20   [provider]     Family History  Problem Relation Age of Onset   Asthma Mother    Macular degeneration Mother    Hypertension Mother    Alzheimer's disease Father    Other Sister        blood clots   Other Son        enlarged spleen   Diabetes Maternal Grandfather    Diabetes Paternal Grandfather    Colon cancer Neg Hx     Social History   Socioeconomic History   Marital status: Widowed    Spouse name: Not on file   Number of children: 1   Years of education: Not on file   Highest education level: Not on file  Occupational History   Occupation: retired  Tobacco Use   Smoking status: Never    Passive exposure: Yes   Smokeless tobacco: Never  Vaping Use   Vaping Use: Never used  Substance and Sexual Activity   Alcohol use: No   Drug use: No   Sexual activity: Yes    Birth control/protection: Surgical    Comment: tubal   Other Topics Concern   Not on file  Social History Narrative   Not on file   Social Determinants of Health   Financial Resource Strain: Not on file  Food Insecurity: Not on file  Transportation Needs: Not on file  Physical Activity: Not on file  Stress: Not on file  Social Connections: Not on file    Review of Systems: A 12 point ROS discussed and pertinent positives are indicated in the HPI above.  All other systems are negative.  Review of Systems  Constitutional:  Positive for fatigue. Negative for appetite change, chills and fever.  Respiratory:  Negative for shortness of breath.   Cardiovascular:  Negative for chest pain and leg swelling.  Gastrointestinal:  Negative for abdominal pain, nausea and vomiting.  Neurological:  Positive for  dizziness. Negative for weakness and headaches.  Hematological:  Bruises/bleeds easily.    Vital Signs: BP 120/69   Pulse 65   Temp 98.7 F (37.1 C) (Oral)   SpO2 97%     Physical Exam Vitals reviewed.  Constitutional:      General: She is not in acute distress.    Appearance: Normal appearance. She is not ill-appearing.  HENT:     Head: Normocephalic and atraumatic.     Mouth/Throat:     Mouth: Mucous membranes are dry.     Pharynx: Oropharynx is clear.  Eyes:     Extraocular Movements: Extraocular movements intact.     Pupils: Pupils are equal, round, and reactive to light.  Cardiovascular:     Rate and Rhythm: Normal rate and regular rhythm.     Pulses: Normal pulses.     Heart sounds: Normal heart sounds. No murmur heard. Pulmonary:     Effort: Pulmonary effort is normal. No respiratory distress.     Breath sounds: Normal breath sounds.  Abdominal:     General: Bowel sounds are normal. There is no distension.     Palpations: Abdomen is soft.     Tenderness: There is no abdominal tenderness. There is no guarding.  Musculoskeletal:     Right lower leg: No edema.     Left lower leg: No edema.  Skin:    General:  Skin is warm and dry.  Neurological:     Mental Status: She is alert and oriented to person, place, and time.  Psychiatric:        Mood and Affect: Mood normal.        Behavior: Behavior normal.        Thought Content: Thought content normal.        Judgment: Judgment normal.     Imaging: No results found.  Labs:  CBC: Recent Labs    04/17/22 0933 04/26/22 0922  WBC 15.8* 10.0  HGB 9.7* 9.1*  HCT 31.8* 29.9*  PLT 128* 146*    COAGS: No results for input(s): "INR", "APTT" in the last 8760 hours.  BMP: Recent Labs    04/17/22 0933 04/26/22 0922  NA 135 141  K 4.1 3.9  CL 103 107  CO2 29 28  GLUCOSE 118* 97  BUN 12 16  CALCIUM 9.3 9.4  CREATININE 1.03* 1.03*  GFRNONAA 58* 58*    LIVER FUNCTION TESTS: Recent Labs    04/26/22 0922  BILITOT 1.0  AST 32  ALT 20  ALKPHOS 57  PROT 6.6  ALBUMIN 4.0    TUMOR MARKERS: No results for input(s): "AFPTM", "CEA", "CA199", "CHROMGRNA" in the last 8760 hours.  Assessment and Plan: 71 yo female presents to IR for bone marrow biopsy for anemia and thrombocytopenia.   Pt resting on bed with RN at bedside.  She is A&O, calm and pleasant.  She is in no distress.   Risks and benefits of bone marrow biopsy and aspiration with moderate sedation was discussed with the patient and/or patient's family including, but not limited to bleeding, infection, damage to adjacent structures or low yield requiring additional tests.  All of the questions were answered and there is agreement to proceed.  Consent signed and in chart.  Thank you for this interesting consult.  I greatly enjoyed meeting Estephanie J Buxton and look forward to participating in their care.  A copy of this report was sent to the requesting provider on this date.  Electronically Signed: Alysia Penna  Ramsha Lonigro, NP 05/25/2022, 8:08 AM   I spent a total of 20 minutes in face to face in clinical consultation, greater than 50% of which was counseling/coordinating care for  anemia, thrombocytopenia.

## 2022-05-25 ENCOUNTER — Encounter (HOSPITAL_COMMUNITY): Payer: Self-pay

## 2022-05-25 ENCOUNTER — Ambulatory Visit (HOSPITAL_COMMUNITY)
Admission: RE | Admit: 2022-05-25 | Discharge: 2022-05-25 | Disposition: A | Discharge status: Home / Self Care | Payer: Medicare HMO | Source: Ambulatory Visit | Attending: Hematology | Admitting: Hematology

## 2022-05-25 ENCOUNTER — Ambulatory Visit (HOSPITAL_COMMUNITY)
Admission: RE | Admit: 2022-05-25 | Discharge: 2022-05-25 | Disposition: A | Discharge status: Home / Self Care | Payer: Medicare HMO | Source: Ambulatory Visit

## 2022-05-25 ENCOUNTER — Other Ambulatory Visit: Payer: Self-pay

## 2022-05-25 DIAGNOSIS — D6101 Constitutional (pure) red blood cell aplasia: Secondary | ICD-10-CM | POA: Diagnosis not present

## 2022-05-25 DIAGNOSIS — R5383 Other fatigue: Secondary | ICD-10-CM | POA: Insufficient documentation

## 2022-05-25 DIAGNOSIS — D539 Nutritional anemia, unspecified: Secondary | ICD-10-CM | POA: Insufficient documentation

## 2022-05-25 DIAGNOSIS — D696 Thrombocytopenia, unspecified: Secondary | ICD-10-CM | POA: Insufficient documentation

## 2022-05-25 DIAGNOSIS — D72821 Monocytosis (symptomatic): Secondary | ICD-10-CM | POA: Diagnosis not present

## 2022-05-25 DIAGNOSIS — R42 Dizziness and giddiness: Secondary | ICD-10-CM | POA: Insufficient documentation

## 2022-05-25 DIAGNOSIS — Z1379 Encounter for other screening for genetic and chromosomal anomalies: Secondary | ICD-10-CM | POA: Diagnosis not present

## 2022-05-25 DIAGNOSIS — D72829 Elevated white blood cell count, unspecified: Secondary | ICD-10-CM | POA: Insufficient documentation

## 2022-05-25 DIAGNOSIS — D7589 Other specified diseases of blood and blood-forming organs: Secondary | ICD-10-CM | POA: Diagnosis not present

## 2022-05-25 LAB — CBC WITH DIFFERENTIAL/PLATELET
Abs Immature Granulocytes: 1.7 10*3/uL — ABNORMAL HIGH (ref 0.00–0.07)
Basophils Absolute: 0.1 10*3/uL (ref 0.0–0.1)
Basophils Relative: 1 %
Eosinophils Absolute: 0 10*3/uL (ref 0.0–0.5)
Eosinophils Relative: 0 %
HCT: 29.3 % — ABNORMAL LOW (ref 36.0–46.0)
Hemoglobin: 8.9 g/dL — ABNORMAL LOW (ref 12.0–15.0)
Immature Granulocytes: 18 %
Lymphocytes Relative: 9 %
Lymphs Abs: 0.9 10*3/uL (ref 0.7–4.0)
MCH: 33 pg (ref 26.0–34.0)
MCHC: 30.4 g/dL (ref 30.0–36.0)
MCV: 108.5 fL — ABNORMAL HIGH (ref 80.0–100.0)
Monocytes Absolute: 2.9 10*3/uL — ABNORMAL HIGH (ref 0.1–1.0)
Monocytes Relative: 31 %
Neutro Abs: 3.8 10*3/uL (ref 1.7–7.7)
Neutrophils Relative %: 41 %
Platelets: 118 10*3/uL — ABNORMAL LOW (ref 150–400)
RBC: 2.7 MIL/uL — ABNORMAL LOW (ref 3.87–5.11)
RDW: 18 % — ABNORMAL HIGH (ref 11.5–15.5)
WBC: 9.4 10*3/uL (ref 4.0–10.5)
nRBC: 0.3 % — ABNORMAL HIGH (ref 0.0–0.2)

## 2022-05-25 MED ORDER — FENTANYL CITRATE (PF) 100 MCG/2ML IJ SOLN
INTRAMUSCULAR | Status: AC | PRN
  Administered 2022-05-25: 50 ug via INTRAVENOUS

## 2022-05-25 MED ORDER — FENTANYL CITRATE (PF) 100 MCG/2ML IJ SOLN
INTRAMUSCULAR | Status: AC
  Filled 2022-05-25: qty 2

## 2022-05-25 MED ORDER — SODIUM CHLORIDE 0.9 % IV SOLN: INTRAVENOUS | Status: DC

## 2022-05-25 MED ORDER — MIDAZOLAM HCL 2 MG/2ML IJ SOLN
INTRAMUSCULAR | Status: AC | PRN
  Administered 2022-05-25: 1 mg via INTRAVENOUS

## 2022-05-25 MED ORDER — MIDAZOLAM HCL 2 MG/2ML IJ SOLN
INTRAMUSCULAR | Status: AC
  Filled 2022-05-25: qty 2

## 2022-05-25 NOTE — Procedures (Signed)
Interventional Radiology Procedure:   Indications: Macrocytic anemia and thrombocytopenia  Procedure: CT guided bone marrow biopsy  Findings: 2 aspirates and 2 cores from right ilium  Complications: None     EBL: Minimal, less than 10 ml  Plan: Discharge to home in one hour.   Mohmmad Saleeby R. Anselm Pancoast, MD  Pager: [phone]

## 2022-05-25 NOTE — Discharge Instructions (Signed)
Please call Interventional Radiology clinic [phone] with any questions or concerns.  You may remove your dressing and shower tomorrow.  Moderate Conscious Sedation, Adult, Care After This sheet gives you information about how to care for yourself after your procedure. Your health care provider may also give you more specific instructions. If you have problems or questions, contact your health careprovider. What can I expect after the procedure? After the procedure, it is common to have: Sleepiness for several hours. Impaired judgment for several hours. Difficulty with balance. Vomiting if you eat too soon. Follow these instructions at home: For the time period you were told by your health care provider: Rest. Do not participate in activities where you could fall or become injured. Do not drive or use machinery. Do not drink alcohol. Do not take sleeping pills or medicines that cause drowsiness. Do not make important decisions or sign legal documents. Do not take care of children on your own. Eating and drinking  Follow the diet recommended by your health care provider. Drink enough fluid to keep your urine pale yellow. If you vomit: Drink water, juice, or soup when you can drink without vomiting. Make sure you have little or no nausea before eating solid foods.  General instructions Take over-the-counter and prescription medicines only as told by your health care provider. Have a responsible adult stay with you for the time you are told. It is important to have someone help care for you until you are awake and alert. Do not smoke. Keep all follow-up visits as told by your health care provider. This is important. Contact a health care provider if: You are still sleepy or having trouble with balance after 24 hours. You feel light-headed. You keep feeling nauseous or you keep vomiting. You develop a rash. You have a fever. You have redness or swelling around the IV  site. Get help right away if: You have trouble breathing. You have new-onset confusion at home. Summary After the procedure, it is common to feel sleepy, have impaired judgment, or feel nauseous if you eat too soon. Rest after you get home. Know the things you should not do after the procedure. Follow the diet recommended by your health care provider and drink enough fluid to keep your urine pale yellow. Get help right away if you have trouble breathing or new-onset confusion at home. This information is not intended to replace advice given to you by your health care provider. Make sure you discuss any questions you have with your healthcare provider. Document Revised: 11/02/2019 Document Reviewed: 05/31/2019 Elsevier Patient Education  2022 [place].   Bone Marrow Aspiration and Bone Marrow Biopsy, Adult, Care After This sheet gives you information about how to care for yourself after your procedure. Your health care provider may also give you more specific instructions. If you have problems or questions, contact your health careprovider. What can I expect after the procedure? After the procedure, it is common to have: Mild pain and tenderness. Swelling. Bruising. Follow these instructions at home: Puncture site care Follow instructions from your health care provider about how to take care of the puncture site. Make sure you: Wash your hands with soap and water before and after you change your bandage (dressing). If soap and water are not available, use hand sanitizer. Change your dressing as told by your health care provider. Check your puncture site every day for signs of infection. Check for: More redness, swelling, or pain. Fluid or blood. Warmth. Pus or a bad  smell.  Activity Return to your normal activities as told by your health care provider. Ask your health care provider what activities are safe for you. Do not lift anything that is heavier than 10 lb (4.5 kg), or the  limit that you are told, until your health care provider says that it is safe. Do not drive for 24 hours if you were given a sedative during your procedure. General instructions Take over-the-counter and prescription medicines only as told by your health care provider. Do not take baths, swim, or use a hot tub until your health care provider approves. Ask your health care provider if you may take showers. You may only be allowed to take sponge baths. If directed, put ice on the affected area. To do this: Put ice in a plastic bag. Place a towel between your skin and the bag. Leave the ice on for 20 minutes, 2-3 times a day. Keep all follow-up visits as told by your health care provider. This is important.  Contact a health care provider if: Your pain is not controlled with medicine. You have a fever. You have more redness, swelling, or pain around the puncture site. You have fluid or blood coming from the puncture site. Your puncture site feels warm to the touch. You have pus or a bad smell coming from the puncture site. Summary After the procedure, it is common to have mild pain, tenderness, swelling, and bruising. Follow instructions from your health care provider about how to take care of the puncture site and what activities are safe for you. Take over-the-counter and prescription medicines only as told by your health care provider. Contact a health care provider if you have any signs of infection, such as fluid or blood coming from the puncture site. This information is not intended to replace advice given to you by your health care provider. Make sure you discuss any questions you have with your healthcare provider. Document Revised: 11/21/2018 Document Reviewed: 11/21/2018 Elsevier Patient Education  [place].

## 2022-05-26 ENCOUNTER — Ambulatory Visit: Payer: Medicare HMO

## 2022-05-27 LAB — SURGICAL PATHOLOGY

## 2022-06-01 ENCOUNTER — Encounter (HOSPITAL_COMMUNITY): Payer: Self-pay | Admitting: Hematology

## 2022-06-02 ENCOUNTER — Ambulatory Visit (INDEPENDENT_AMBULATORY_CARE_PROVIDER_SITE_OTHER): Payer: Medicare HMO | Admitting: *Deleted

## 2022-06-02 ENCOUNTER — Telehealth: Payer: Self-pay | Admitting: *Deleted

## 2022-06-02 DIAGNOSIS — J4550 Severe persistent asthma, uncomplicated: Secondary | ICD-10-CM | POA: Diagnosis not present

## 2022-06-02 NOTE — Telephone Encounter (Signed)
Patient came in and stated that she lives closer to [place] and she would like to continue her Tezsp[place] injections in [place] and will come see Dr. Nelva Bush for her office visits in [place]. Patient has been scheduled in [place] for her next injection.

## 2022-06-03 ENCOUNTER — Encounter: Payer: Self-pay | Admitting: Hematology

## 2022-06-03 ENCOUNTER — Inpatient Hospital Stay: Payer: Medicare HMO | Attending: Hematology | Admitting: Hematology

## 2022-06-03 VITALS — BP 125/81 | HR 79 | Temp 98.6°F | Resp 17 | Ht 60.0 in | Wt 139.7 lb

## 2022-06-03 DIAGNOSIS — R079 Chest pain, unspecified: Secondary | ICD-10-CM | POA: Insufficient documentation

## 2022-06-03 DIAGNOSIS — Z83518 Family history of other specified eye disorder: Secondary | ICD-10-CM | POA: Insufficient documentation

## 2022-06-03 DIAGNOSIS — Z8249 Family history of ischemic heart disease and other diseases of the circulatory system: Secondary | ICD-10-CM | POA: Insufficient documentation

## 2022-06-03 DIAGNOSIS — Z882 Allergy status to sulfonamides status: Secondary | ICD-10-CM | POA: Diagnosis not present

## 2022-06-03 DIAGNOSIS — K219 Gastro-esophageal reflux disease without esophagitis: Secondary | ICD-10-CM | POA: Diagnosis not present

## 2022-06-03 DIAGNOSIS — R0602 Shortness of breath: Secondary | ICD-10-CM | POA: Diagnosis not present

## 2022-06-03 DIAGNOSIS — Z832 Family history of diseases of the blood and blood-forming organs and certain disorders involving the immune mechanism: Secondary | ICD-10-CM | POA: Insufficient documentation

## 2022-06-03 DIAGNOSIS — Z79899 Other long term (current) drug therapy: Secondary | ICD-10-CM | POA: Diagnosis not present

## 2022-06-03 DIAGNOSIS — R42 Dizziness and giddiness: Secondary | ICD-10-CM | POA: Insufficient documentation

## 2022-06-03 DIAGNOSIS — D696 Thrombocytopenia, unspecified: Secondary | ICD-10-CM | POA: Insufficient documentation

## 2022-06-03 DIAGNOSIS — D539 Nutritional anemia, unspecified: Secondary | ICD-10-CM | POA: Diagnosis not present

## 2022-06-03 DIAGNOSIS — E538 Deficiency of other specified B group vitamins: Secondary | ICD-10-CM | POA: Diagnosis not present

## 2022-06-03 DIAGNOSIS — Z818 Family history of other mental and behavioral disorders: Secondary | ICD-10-CM | POA: Diagnosis not present

## 2022-06-03 DIAGNOSIS — C9310 Chronic myelomonocytic leukemia not having achieved remission: Secondary | ICD-10-CM | POA: Insufficient documentation

## 2022-06-03 DIAGNOSIS — J45909 Unspecified asthma, uncomplicated: Secondary | ICD-10-CM | POA: Diagnosis not present

## 2022-06-03 DIAGNOSIS — Z833 Family history of diabetes mellitus: Secondary | ICD-10-CM | POA: Insufficient documentation

## 2022-06-03 DIAGNOSIS — R634 Abnormal weight loss: Secondary | ICD-10-CM | POA: Diagnosis not present

## 2022-06-03 HISTORY — DX: Chronic myelomonocytic leukemia not having achieved remission: C93.10

## 2022-06-03 NOTE — Progress Notes (Signed)
[name], [place] [address]   CLINIC:  Medical Oncology/Hematology  PCP:  Asencion Noble, MD [address] [place] [address] [phone]   REASON FOR VISIT:  CMML  PRIOR THERAPY: None.  NGS Results: Not applicable.  CURRENT THERAPY: Under work-up.  BRIEF ONCOLOGIC HISTORY:  Oncology History   No history exists.    CANCER STAGING:  Cancer Staging  No matching staging information was found for the patient.   INTERVAL HISTORY:  [name] 71 y.o. female seen for follow-up of abnormal CBC.  She underwent bone marrow aspiration and biopsy done on 05/25/2022.  She is accompanied by her Linward Foster today.  Energy levels are reported as 70%.    REVIEW OF SYSTEMS:  Review of Systems  Respiratory:  Positive for shortness of breath.   Cardiovascular:  Positive for chest pain.  Neurological:  Positive for dizziness.  All other systems reviewed and are negative.    PAST MEDICAL/SURGICAL HISTORY:  Past Medical History:  Diagnosis Date   Anxiety    Asthma    had 1 episode 1 year ago-no more problem   CHF (congestive heart failure) (HCC)    swelling of feet & ankles   Cough    Depression    OCD   GERD (gastroesophageal reflux disease)    severe   OCD (obsessive compulsive disorder)    Shortness of breath    with exertion   Yeast infection 04/09/2014   Past Surgical History:  Procedure Laterality Date   CARPAL TUNNEL RELEASE  03/22/2012   Procedure: CARPAL TUNNEL RELEASE;  Surgeon: Wynonia Sours, MD;  Location: [facility];  Service: Orthopedics;  Laterality: Right;  right carpal tunnel release   COLONOSCOPY  06/19/2012   Dr. Rourk:normal rectum and colon    ESOPHAGOGASTRODUODENOSCOPY N/A 12/26/2015   Dr. Gala Romney: normal    TONSILLECTOMY     TUBAL LIGATION       SOCIAL HISTORY:  Social History   Socioeconomic History   Marital status: Widowed    Spouse name: Not on file   Number of children: 1    Years of education: Not on file   Highest education level: Not on file  Occupational History   Occupation: retired  Tobacco Use   Smoking status: Never    Passive exposure: Yes   Smokeless tobacco: Never  Vaping Use   Vaping Use: Never used  Substance and Sexual Activity   Alcohol use: No   Drug use: No   Sexual activity: Yes    Birth control/protection: Surgical    Comment: tubal  Other Topics Concern   Not on file  Social History Narrative   Not on file   Social Determinants of Health   Financial Resource Strain: Not on file  Food Insecurity: Not on file  Transportation Needs: Not on file  Physical Activity: Not on file  Stress: Not on file  Social Connections: Not on file  Intimate Partner Violence: Not on file    FAMILY HISTORY:  Family History  Problem Relation Age of Onset   Asthma Mother    Macular degeneration Mother    Hypertension Mother    Alzheimer's disease Father    Other Sister        blood clots   Other Son        enlarged spleen   Diabetes Maternal Grandfather    Diabetes Paternal Grandfather    Colon cancer Neg Hx  CURRENT MEDICATIONS:  Outpatient Encounter Medications as of 06/03/2022  Medication Sig   albuterol (VENTOLIN HFA) 108 (90 Base) MCG/ACT inhaler INHALE 2 PUFFS BY MOUTH EVERY 6 HOURS AS NEEDED FOR SHORTNESS OF BREATH OR WHEEZING.   azelastine (ASTELIN) 0.1 % nasal spray SPRAY 1 TO 2 SPRAYS PER NOSTRIL TWICE DAILY.   BREZTRI AEROSPHERE 160-9-4.8 MCG/ACT AERO INHALE 2 PUFFS BY MOUTH TWICE DAILY   celecoxib (CELEBREX) 100 MG capsule Take 2 capsules (200 mg total) by mouth 2 (two) times daily.   cetirizine (ZYRTEC) 5 MG tablet Take 1 tablet (5 mg total) by mouth daily.   ciclopirox (PENLAC) 8 % solution Apply topically at bedtime. Apply over nail and surrounding skin. Apply daily over previous coat. After seven (7) days, may remove with alcohol and continue cycle.   cimetidine (TAGAMET) 800 MG tablet TAKE 1/2 TABLET BY MOUTH ONCE OR  TWICE DAILY AS NEEDED   dexlansoprazole (DEXILANT) 60 MG capsule Take 1 capsule (60 mg total) by mouth daily.   EPINEPHrine 0.3 mg/0.3 mL IJ SOAJ injection Inject 0.3 mg into the muscle as needed for anaphylaxis.   famotidine (PEPCID) 20 MG tablet Take 20 mg by mouth 2 (two) times daily.   FLUoxetine (PROZAC) 20 MG capsule Take 1 capsule by mouth daily.   fluticasone (FLONASE) 50 MCG/ACT nasal spray Place 2 sprays into both nostrils 2 (two) times daily.   Fluticasone-Umeclidin-Vilant (TRELEGY ELLIPTA) 200-62.5-25 MCG/ACT AEPB Inhale 1 puff into the lungs daily.   folic acid (FOLVITE) 1 MG tablet Take 1 tablet (1 mg total) by mouth daily.   gabapentin (NEURONTIN) 300 MG capsule    ipratropium (ATROVENT) 0.06 % nasal spray USE 2 SPRAYS IN EACH NOSTRIL THREE TIMES A DAY AS NEEDED.   montelukast (SINGULAIR) 10 MG tablet TAKE ONE TABLET BY MOUTH AT BEDTIME.   naproxen (NAPROSYN) 500 MG tablet Take 500 mg by mouth 2 (two) times daily.   NON FORMULARY [facility]  Antifungal (nail)-#1   NON FORMULARY [facility]  Anti-fungal (nail)-#1   omeprazole (PRILOSEC) 20 MG capsule Take 1 capsule (20 mg total) by mouth daily.   oxyCODONE-acetaminophen (PERCOCET/ROXICET) 5-325 MG tablet Take 1 tablet by mouth every 6 (six) hours as needed for severe pain.   pantoprazole (PROTONIX) 40 MG tablet Take 40 mg by mouth daily.   Respiratory Therapy Supplies (FLUTTER) DEVI Use as directed   SF 5000 PLUS 1.1 % CREA dental cream    Spacer/Aero-Holding Chambers (AEROCHAMBER PLUS WITH MASK) inhaler 1 each by Other route See admin instructions. Use with inhaler as instructed.   triamcinolone cream (KENALOG) 0.1 %    Facility-Administered Encounter Medications as of 06/03/2022  Medication   tezepelumab-ekko (TEZSPIRE) 210 MG/1.91ML syringe 210 mg    ALLERGIES:  Allergies  Allergen Reactions   Levonorgestrel-Ethinyl Estrad Cough   Sulfa Antibiotics Other (See Comments)    Unknown- pt unsure of  reaction; believes it may be nausea   Sulfamethoxazole-Trimethoprim Other (See Comments)   Cephalosporins Other (See Comments)    Other Reaction: Toxicity     PHYSICAL EXAM:  ECOG Performance status: 1  Vitals:   06/03/22 1512  BP: 125/81  Pulse: 79  Resp: 17  Temp: 98.6 F (37 C)  SpO2: 97%   Filed Weights   06/03/22 1512  Weight: 139 lb 11.2 oz (63.4 kg)   Physical Exam Vitals reviewed.  Constitutional:      Appearance: Normal appearance.  Cardiovascular:     Rate and Rhythm: Normal rate and regular rhythm.  Heart sounds: Normal heart sounds.  Pulmonary:     Breath sounds: Normal breath sounds.  Abdominal:     Palpations: Abdomen is soft. There is no mass.  Neurological:     Mental Status: She is alert.  Psychiatric:        Mood and Affect: Mood normal.        Behavior: Behavior normal.     LABORATORY DATA:  I have reviewed the labs as listed.  CBC    Component Value Date/Time   WBC 9.4 05/25/2022 0800   RBC 2.70 (L) 05/25/2022 0800   HGB 8.9 (L) 05/25/2022 0800   HGB 13.1 07/23/2020 1112   HCT 29.3 (L) 05/25/2022 0800   HCT 40.7 07/23/2020 1112   PLT 118 (L) 05/25/2022 0800   MCV 108.5 (H) 05/25/2022 0800   MCV 95 07/23/2020 1112   MCH 33.0 05/25/2022 0800   MCHC 30.4 05/25/2022 0800   RDW 18.0 (H) 05/25/2022 0800   RDW 14.2 07/23/2020 1112   LYMPHSABS 0.9 05/25/2022 0800   LYMPHSABS 0.7 07/23/2020 1112   MONOABS 2.9 (H) 05/25/2022 0800   EOSABS 0.0 05/25/2022 0800   EOSABS 0.1 07/23/2020 1112   BASOSABS 0.1 05/25/2022 0800   BASOSABS 0.0 07/23/2020 1112      Latest Ref Rng & Units 04/26/2022    9:22 AM 04/17/2022    9:33 AM 02/24/2019    2:15 PM  CMP  Glucose 70 - 99 mg/dL 97  118  97   BUN 8 - 23 mg/dL _0 Creatinine 0.44 - 1.00 mg/dL 1.03  1.03  0.90   Sodium 135 - 145 mmol/L 141  135  137   Potassium 3.5 - 5.1 mmol/L 3.9  4.1  4.0   Chloride 98 - 111 mmol/L 107  103  104   CO2 22 - 32 mmol/L _1 Calcium 8.9 -  10.3 mg/dL 9.4  9.3  9.3   Total Protein 6.5 - 8.1 g/dL 6.6   7.4   Total Bilirubin 0.3 - 1.2 mg/dL 1.0   1.0   Alkaline Phos 38 - 126 U/L 57   60   AST 15 - 41 U/L 32   25   ALT 0 - 44 U/L 20   17     DIAGNOSTIC IMAGING:  I have independently reviewed the scans and discussed with the patient.  ASSESSMENT: 1.  Macrocytic anemia and thrombocytopenia: - Patient seen at the request of Dr. Willey Blade for abnormal CBC. - 04/17/2022: WBC 15.8, Hb-9.7, PLT-128 - 03/24/2022: WBC-8.1 (N-43%, L-17%, M-19%, B-2%), Hb-9.6, MCV-99, PLT-115 - Smear review: 13% metamyelocytes, 2% myelocytes, 2% blasts,  - 10/19/2021: WBC-5.3 (N-61, L-21, M-10%), Hb-11, MCV-96, PLT-97 - BMBX (05/25/2022): Cytogenetics-46, XX   2.  Social/family history: - She lives at home by herself.  Son lives next door. - Does part-time work at [employer].  Worked in [place] for 30 years prior to retirement.  Non-smoker. - Maternal aunt had tumor in the breast, patient not certain if it is cancer.  2 maternal first cousins had lymphoma.  Mother had stomach cancer.   PLAN:  1.  Dysplastic CMML-1: - We discussed bone marrow biopsy results from 05/25/2022 which showed hypercellular bone marrow with myeloid hyperplasia with dysgranulopoiesis, erythroid hypoplasia and megakaryocytic hyperplasia with dyspoiesis.  Chromosome analysis was 35, XX. - Bone marrow blasts are less than 5% and peripheral blood blasts are less than 2%. - Mayo  molecular model risk stratifies into intermediate 2 risk with at least 2 points (decreased hemoglobin less than 10, circulating immature cells), pending NGS findings.  Intermediate to risk as low OS 31 months.  This is subject to change as we do not have NGS findings. - Recommend NGS myeloid panel, LDH and serum Keppra levels. - Recommend BCR/ABL by FISH. - She also reported 15 pound weight loss since March.  Hence have recommended CT CAP with contrast. - RTC 3 to 4 weeks for follow-up.  2.   Folate deficiency: -Previous work-up showed low folic acid.  Continue folic acid 1 mg tablet daily.   Orders placed this encounter:  No orders of the defined types were placed in this encounter.     Derek Jack, MD [place] [phone]

## 2022-06-03 NOTE — Patient Instructions (Addendum)
[facility]  Discharge Instructions  You were seen and examined today by Dr. Delton Coombes.  Dr. Delton Coombes discussed MDS- myelodysplastic syndrome and CMML- Chronic myelomonocytic leukemia.  Dr. Delton Coombes is ordering further labs and waiting for the bone marrow biopsy test that is still pending to get more information. This will let him know what possible treatment options are.  Follow-up as scheduled.    Thank you for choosing [facility] to provide your oncology and hematology care.   To afford each patient quality time with our provider, please arrive at least 15 minutes before your scheduled appointment time. You may need to reschedule your appointment if you arrive late (10 or more minutes). Arriving late affects you and other patients whose appointments are after yours.  Also, if you miss three or more appointments without notifying the office, you may be dismissed from the clinic at the provider's discretion.    Again, thank you for choosing [facility].  Our hope is that these requests will decrease the amount of time that you wait before being seen by our physicians.   If you have a lab appointment with the [facility] please come in thru the Main Entrance and check in at the main information desk.           _____________________________________________________________  Should you have questions after your visit to [facility], please contact our office at [phone] and follow the prompts.  Our office hours are 8:00 a.m. to 4:30 p.m. Monday - Thursday and 8:00 a.m. to 2:30 p.m. Friday.  Please note that voicemails left after 4:00 p.m. may not be returned until the following business day.  We are closed weekends and all major holidays.  You do have access to a nurse 24-7, just call the main number to the clinic [phone] and do not press any options, hold on the line and a nurse will answer the  phone.    For prescription refill requests, have your pharmacy contact our office and allow 72 hours.    Masks are optional in the cancer centers. If you would like for your care team to wear a mask while they are taking care of you, please let them know. You may have one support person who is at least 71 years old accompany you for your appointments.

## 2022-06-04 ENCOUNTER — Other Ambulatory Visit: Payer: Self-pay

## 2022-06-04 ENCOUNTER — Inpatient Hospital Stay: Payer: Medicare HMO

## 2022-06-04 DIAGNOSIS — R634 Abnormal weight loss: Secondary | ICD-10-CM

## 2022-06-04 DIAGNOSIS — D539 Nutritional anemia, unspecified: Secondary | ICD-10-CM | POA: Diagnosis not present

## 2022-06-04 DIAGNOSIS — D696 Thrombocytopenia, unspecified: Secondary | ICD-10-CM

## 2022-06-04 DIAGNOSIS — R079 Chest pain, unspecified: Secondary | ICD-10-CM | POA: Diagnosis not present

## 2022-06-04 DIAGNOSIS — R0602 Shortness of breath: Secondary | ICD-10-CM | POA: Diagnosis not present

## 2022-06-04 DIAGNOSIS — J45909 Unspecified asthma, uncomplicated: Secondary | ICD-10-CM | POA: Diagnosis not present

## 2022-06-04 DIAGNOSIS — K219 Gastro-esophageal reflux disease without esophagitis: Secondary | ICD-10-CM | POA: Diagnosis not present

## 2022-06-04 DIAGNOSIS — R42 Dizziness and giddiness: Secondary | ICD-10-CM | POA: Diagnosis not present

## 2022-06-04 DIAGNOSIS — C9310 Chronic myelomonocytic leukemia not having achieved remission: Secondary | ICD-10-CM | POA: Diagnosis not present

## 2022-06-04 LAB — LACTATE DEHYDROGENASE: LDH: 416 U/L — ABNORMAL HIGH (ref 98–192)

## 2022-06-04 NOTE — Progress Notes (Signed)
Message from Dr. Delton Coombes- Please add BCR/ABL by [facility] testing to the blood work she will have on Friday. Orders placed per Dr. Tomie China request.

## 2022-06-05 LAB — ERYTHROPOIETIN: Erythropoietin: 41.4 m[IU]/mL — ABNORMAL HIGH (ref 2.6–18.5)

## 2022-06-07 ENCOUNTER — Ambulatory Visit: Payer: Medicare HMO | Admitting: Hematology

## 2022-06-07 LAB — SURGICAL PATHOLOGY

## 2022-06-08 ENCOUNTER — Telehealth: Payer: Self-pay | Admitting: *Deleted

## 2022-06-08 NOTE — Telephone Encounter (Signed)
Patient called to advise that she has had some intermittent dizziness over the last couple of weeks, not associated with any other symptoms.  Suggested that she consult with Dr. Willey Blade, as it should not be related to her diagnosis.  Verbalized understanding.

## 2022-06-09 ENCOUNTER — Encounter (HOSPITAL_COMMUNITY): Payer: Self-pay | Admitting: Hematology

## 2022-06-09 LAB — BCR-ABL1 FISH
Cells Analyzed: 200
Cells Counted: 200

## 2022-06-14 ENCOUNTER — Telehealth: Payer: Self-pay | Admitting: Family Medicine

## 2022-06-14 NOTE — Telephone Encounter (Signed)
Pt called and cancelled her 06/16/22 appt, stated that she saw Dr. Delton Coombes and he is checking for cancer and she needs to take care of this first.  She will cb to rs later.

## 2022-06-16 ENCOUNTER — Ambulatory Visit: Payer: Medicare HMO | Admitting: Orthopedic Surgery

## 2022-06-21 ENCOUNTER — Ambulatory Visit (HOSPITAL_COMMUNITY)
Admission: RE | Admit: 2022-06-21 | Discharge: 2022-06-21 | Disposition: A | Discharge status: Home / Self Care | Payer: Medicare HMO | Source: Ambulatory Visit | Attending: Hematology | Admitting: Hematology

## 2022-06-21 DIAGNOSIS — R161 Splenomegaly, not elsewhere classified: Secondary | ICD-10-CM | POA: Insufficient documentation

## 2022-06-21 DIAGNOSIS — R590 Localized enlarged lymph nodes: Secondary | ICD-10-CM | POA: Diagnosis not present

## 2022-06-21 DIAGNOSIS — R634 Abnormal weight loss: Secondary | ICD-10-CM | POA: Diagnosis not present

## 2022-06-21 DIAGNOSIS — R599 Enlarged lymph nodes, unspecified: Secondary | ICD-10-CM | POA: Diagnosis not present

## 2022-06-21 DIAGNOSIS — R188 Other ascites: Secondary | ICD-10-CM | POA: Insufficient documentation

## 2022-06-21 MED ORDER — IOHEXOL 9 MG/ML PO SOLN
ORAL | Status: AC
  Filled 2022-06-21: qty 1000

## 2022-06-21 MED ORDER — IOHEXOL 300 MG/ML  SOLN
80.0000 mL | Freq: Once | INTRAMUSCULAR | Status: AC | PRN
  Administered 2022-06-21: 80 mL via INTRAVENOUS

## 2022-06-24 ENCOUNTER — Inpatient Hospital Stay: Payer: Medicare HMO | Attending: Hematology | Admitting: Hematology

## 2022-06-24 VITALS — BP 122/82 | HR 69 | Temp 98.7°F | Resp 17 | Ht 60.0 in | Wt 137.5 lb

## 2022-06-24 DIAGNOSIS — Z882 Allergy status to sulfonamides status: Secondary | ICD-10-CM | POA: Insufficient documentation

## 2022-06-24 DIAGNOSIS — R161 Splenomegaly, not elsewhere classified: Secondary | ICD-10-CM

## 2022-06-24 DIAGNOSIS — R634 Abnormal weight loss: Secondary | ICD-10-CM | POA: Insufficient documentation

## 2022-06-24 DIAGNOSIS — Z833 Family history of diabetes mellitus: Secondary | ICD-10-CM | POA: Insufficient documentation

## 2022-06-24 DIAGNOSIS — R110 Nausea: Secondary | ICD-10-CM | POA: Insufficient documentation

## 2022-06-24 DIAGNOSIS — Z8379 Family history of other diseases of the digestive system: Secondary | ICD-10-CM | POA: Diagnosis not present

## 2022-06-24 DIAGNOSIS — Z825 Family history of asthma and other chronic lower respiratory diseases: Secondary | ICD-10-CM | POA: Insufficient documentation

## 2022-06-24 DIAGNOSIS — R002 Palpitations: Secondary | ICD-10-CM | POA: Diagnosis not present

## 2022-06-24 DIAGNOSIS — K219 Gastro-esophageal reflux disease without esophagitis: Secondary | ICD-10-CM | POA: Diagnosis not present

## 2022-06-24 DIAGNOSIS — Z881 Allergy status to other antibiotic agents status: Secondary | ICD-10-CM | POA: Diagnosis not present

## 2022-06-24 DIAGNOSIS — K5900 Constipation, unspecified: Secondary | ICD-10-CM | POA: Diagnosis not present

## 2022-06-24 DIAGNOSIS — E538 Deficiency of other specified B group vitamins: Secondary | ICD-10-CM | POA: Insufficient documentation

## 2022-06-24 DIAGNOSIS — Z79899 Other long term (current) drug therapy: Secondary | ICD-10-CM | POA: Insufficient documentation

## 2022-06-24 DIAGNOSIS — D539 Nutritional anemia, unspecified: Secondary | ICD-10-CM | POA: Diagnosis not present

## 2022-06-24 DIAGNOSIS — C9310 Chronic myelomonocytic leukemia not having achieved remission: Secondary | ICD-10-CM | POA: Diagnosis not present

## 2022-06-24 DIAGNOSIS — D696 Thrombocytopenia, unspecified: Secondary | ICD-10-CM | POA: Insufficient documentation

## 2022-06-24 DIAGNOSIS — Z8249 Family history of ischemic heart disease and other diseases of the circulatory system: Secondary | ICD-10-CM | POA: Insufficient documentation

## 2022-06-24 DIAGNOSIS — R42 Dizziness and giddiness: Secondary | ICD-10-CM | POA: Insufficient documentation

## 2022-06-24 DIAGNOSIS — R0602 Shortness of breath: Secondary | ICD-10-CM | POA: Diagnosis not present

## 2022-06-24 DIAGNOSIS — Z83518 Family history of other specified eye disorder: Secondary | ICD-10-CM | POA: Insufficient documentation

## 2022-06-24 DIAGNOSIS — Z818 Family history of other mental and behavioral disorders: Secondary | ICD-10-CM | POA: Insufficient documentation

## 2022-06-24 DIAGNOSIS — D649 Anemia, unspecified: Secondary | ICD-10-CM | POA: Diagnosis not present

## 2022-06-24 NOTE — Patient Instructions (Signed)
[facility]  Discharge Instructions  You were seen and examined today by Dr. Delton Coombes.  Dr. Delton Coombes discussed your most recent lab work and CT scan which revealed that you have a couple of lymph nodes and and that your spleen is enlarged.  Dr. Delton Coombes is recommending that you have a PET scan to see what is causing the enlargement in the spleen.  Follow-up as scheduled.    Thank you for choosing [facility] to provide your oncology and hematology care.   To afford each patient quality time with our provider, please arrive at least 15 minutes before your scheduled appointment time. You may need to reschedule your appointment if you arrive late (10 or more minutes). Arriving late affects you and other patients whose appointments are after yours.  Also, if you miss three or more appointments without notifying the office, you may be dismissed from the clinic at the provider's discretion.    Again, thank you for choosing [facility].  Our hope is that these requests will decrease the amount of time that you wait before being seen by our physicians.   If you have a lab appointment with the [facility] please come in thru the Main Entrance and check in at the main information desk.           _____________________________________________________________  Should you have questions after your visit to [facility], please contact our office at [phone] and follow the prompts.  Our office hours are 8:00 a.m. to 4:30 p.m. Monday - Thursday and 8:00 a.m. to 2:30 p.m. Friday.  Please note that voicemails left after 4:00 p.m. may not be returned until the following business day.  We are closed weekends and all major holidays.  You do have access to a nurse 24-7, just call the main number to the clinic [phone] and do not press any options, hold on the line and a nurse will answer the phone.    For  prescription refill requests, have your pharmacy contact our office and allow 72 hours.    Masks are optional in the cancer centers. If you would like for your care team to wear a mask while they are taking care of you, please let them know. You may have one support person who is at least 71 years old accompany you for your appointments.

## 2022-06-24 NOTE — Progress Notes (Signed)
[place], [place] [address]   CLINIC:  Medical Oncology/Hematology  PCP:  Asencion Noble, MD [address] [place] [address] [phone]   REASON FOR VISIT:  CMML  PRIOR THERAPY: None.  NGS Results: Not applicable.  CURRENT THERAPY: Under work-up.  BRIEF ONCOLOGIC HISTORY:  Oncology History   No history exists.    CANCER STAGING:  Cancer Staging  No matching staging information was found for the patient.   INTERVAL HISTORY:  Ms. Cly 71 y.o. female seen for follow-up of CMML.  She reports energy levels of 75%.  Reports some shortness of breath due to underlying asthma.  She reportedly received RSV vaccine on 06/17/2022 in her left arm.  She lost about 2 pounds since last visit.    REVIEW OF SYSTEMS:  Review of Systems  Respiratory:  Positive for shortness of breath.   Cardiovascular:  Positive for palpitations.  Gastrointestinal:  Positive for constipation.  Neurological:  Positive for dizziness.  All other systems reviewed and are negative.    PAST MEDICAL/SURGICAL HISTORY:  Past Medical History:  Diagnosis Date   Anxiety    Asthma    had 1 episode 1 year ago-no more problem   CHF (congestive heart failure) (HCC)    swelling of feet & ankles   CMML (chronic myelomonocytic leukemia) (HCC) 06/03/2022   Cough    Depression    OCD   GERD (gastroesophageal reflux disease)    severe   OCD (obsessive compulsive disorder)    Shortness of breath    with exertion   Yeast infection 04/09/2014   Past Surgical History:  Procedure Laterality Date   CARPAL TUNNEL RELEASE  03/22/2012   Procedure: CARPAL TUNNEL RELEASE;  Surgeon: Wynonia Sours, MD;  Location: [facility];  Service: Orthopedics;  Laterality: Right;  right carpal tunnel release   COLONOSCOPY  06/19/2012   Dr. Rourk:normal rectum and colon    ESOPHAGOGASTRODUODENOSCOPY N/A 12/26/2015   Dr. Gala Romney: normal    TONSILLECTOMY     TUBAL  LIGATION       SOCIAL HISTORY:  Social History   Socioeconomic History   Marital status: Widowed    Spouse name: Not on file   Number of children: 1   Years of education: Not on file   Highest education level: Not on file  Occupational History   Occupation: retired  Tobacco Use   Smoking status: Never    Passive exposure: Yes   Smokeless tobacco: Never  Vaping Use   Vaping Use: Never used  Substance and Sexual Activity   Alcohol use: No   Drug use: No   Sexual activity: Yes    Birth control/protection: Surgical    Comment: tubal  Other Topics Concern   Not on file  Social History Narrative   Not on file   Social Determinants of Health   Financial Resource Strain: Not on file  Food Insecurity: Not on file  Transportation Needs: Not on file  Physical Activity: Not on file  Stress: Not on file  Social Connections: Not on file  Intimate Partner Violence: Not on file    FAMILY HISTORY:  Family History  Problem Relation Age of Onset   Asthma Mother    Macular degeneration Mother    Hypertension Mother    Alzheimer's disease Father    Other Sister        blood clots   Other Son  enlarged spleen   Diabetes Maternal Grandfather    Diabetes Paternal Grandfather    Colon cancer Neg Hx     CURRENT MEDICATIONS:  Outpatient Encounter Medications as of 06/24/2022  Medication Sig   albuterol (VENTOLIN HFA) 108 (90 Base) MCG/ACT inhaler INHALE 2 PUFFS BY MOUTH EVERY 6 HOURS AS NEEDED FOR SHORTNESS OF BREATH OR WHEEZING.   azelastine (ASTELIN) 0.1 % nasal spray SPRAY 1 TO 2 SPRAYS PER NOSTRIL TWICE DAILY.   BREZTRI AEROSPHERE 160-9-4.8 MCG/ACT AERO INHALE 2 PUFFS BY MOUTH TWICE DAILY   celecoxib (CELEBREX) 100 MG capsule Take 2 capsules (200 mg total) by mouth 2 (two) times daily.   cetirizine (ZYRTEC) 5 MG tablet Take 1 tablet (5 mg total) by mouth daily.   ciclopirox (PENLAC) 8 % solution Apply topically at bedtime. Apply over nail and surrounding skin. Apply  daily over previous coat. After seven (7) days, may remove with alcohol and continue cycle.   cimetidine (TAGAMET) 800 MG tablet TAKE 1/2 TABLET BY MOUTH ONCE OR TWICE DAILY AS NEEDED   dexlansoprazole (DEXILANT) 60 MG capsule Take 1 capsule (60 mg total) by mouth daily.   EPINEPHrine 0.3 mg/0.3 mL IJ SOAJ injection Inject 0.3 mg into the muscle as needed for anaphylaxis.   famotidine (PEPCID) 20 MG tablet Take 20 mg by mouth 2 (two) times daily.   FLUoxetine (PROZAC) 20 MG capsule Take 1 capsule by mouth daily.   fluticasone (FLONASE) 50 MCG/ACT nasal spray Place 2 sprays into both nostrils 2 (two) times daily.   Fluticasone-Umeclidin-Vilant (TRELEGY ELLIPTA) 200-62.5-25 MCG/ACT AEPB Inhale 1 puff into the lungs daily.   folic acid (FOLVITE) 1 MG tablet Take 1 tablet (1 mg total) by mouth daily.   gabapentin (NEURONTIN) 300 MG capsule    ipratropium (ATROVENT) 0.06 % nasal spray USE 2 SPRAYS IN EACH NOSTRIL THREE TIMES A DAY AS NEEDED.   montelukast (SINGULAIR) 10 MG tablet TAKE ONE TABLET BY MOUTH AT BEDTIME.   naproxen (NAPROSYN) 500 MG tablet Take 500 mg by mouth 2 (two) times daily.   NON FORMULARY [facility]  Antifungal (nail)-#1   NON FORMULARY [facility]  Anti-fungal (nail)-#1   omeprazole (PRILOSEC) 20 MG capsule Take 1 capsule (20 mg total) by mouth daily.   oxyCODONE-acetaminophen (PERCOCET/ROXICET) 5-325 MG tablet Take 1 tablet by mouth every 6 (six) hours as needed for severe pain.   pantoprazole (PROTONIX) 40 MG tablet Take 40 mg by mouth daily.   Respiratory Therapy Supplies (FLUTTER) DEVI Use as directed   SF 5000 PLUS 1.1 % CREA dental cream    Spacer/Aero-Holding Chambers (AEROCHAMBER PLUS WITH MASK) inhaler 1 each by Other route See admin instructions. Use with inhaler as instructed.   triamcinolone cream (KENALOG) 0.1 %    Facility-Administered Encounter Medications as of 06/24/2022  Medication   tezepelumab-ekko (TEZSPIRE) 210 MG/1.91ML syringe 210  mg    ALLERGIES:  Allergies  Allergen Reactions   Levonorgestrel-Ethinyl Estrad Cough   Sulfa Antibiotics Other (See Comments)    Unknown- pt unsure of reaction; believes it may be nausea   Sulfamethoxazole-Trimethoprim Other (See Comments)   Cephalosporins Other (See Comments)    Other Reaction: Toxicity     PHYSICAL EXAM:  ECOG Performance status: 1  Vitals:   06/24/22 1422  BP: 122/82  Pulse: 69  Resp: 17  Temp: 98.7 F (37.1 C)   Filed Weights   06/24/22 1422  Weight: 137 lb 8 oz (62.4 kg)   Physical Exam Vitals reviewed.  Constitutional:  Appearance: Normal appearance.  Cardiovascular:     Rate and Rhythm: Normal rate and regular rhythm.     Heart sounds: Normal heart sounds.  Pulmonary:     Breath sounds: Normal breath sounds.  Abdominal:     Palpations: Abdomen is soft. There is no mass.  Neurological:     Mental Status: She is alert.  Psychiatric:        Mood and Affect: Mood normal.        Behavior: Behavior normal.      LABORATORY DATA:  I have reviewed the labs as listed.  CBC    Component Value Date/Time   WBC 9.4 05/25/2022 0800   RBC 2.70 (L) 05/25/2022 0800   HGB 8.9 (L) 05/25/2022 0800   HGB 13.1 07/23/2020 1112   HCT 29.3 (L) 05/25/2022 0800   HCT 40.7 07/23/2020 1112   PLT 118 (L) 05/25/2022 0800   MCV 108.5 (H) 05/25/2022 0800   MCV 95 07/23/2020 1112   MCH 33.0 05/25/2022 0800   MCHC 30.4 05/25/2022 0800   RDW 18.0 (H) 05/25/2022 0800   RDW 14.2 07/23/2020 1112   LYMPHSABS 0.9 05/25/2022 0800   LYMPHSABS 0.7 07/23/2020 1112   MONOABS 2.9 (H) 05/25/2022 0800   EOSABS 0.0 05/25/2022 0800   EOSABS 0.1 07/23/2020 1112   BASOSABS 0.1 05/25/2022 0800   BASOSABS 0.0 07/23/2020 1112      Latest Ref Rng & Units 04/26/2022    9:22 AM 04/17/2022    9:33 AM 02/24/2019    2:15 PM  CMP  Glucose 70 - 99 mg/dL 97  118  97   BUN 8 - 23 mg/dL '16  12  20   '$ Creatinine 0.44 - 1.00 mg/dL 1.03  1.03  0.90   Sodium 135 - 145 mmol/L 141   135  137   Potassium 3.5 - 5.1 mmol/L 3.9  4.1  4.0   Chloride 98 - 111 mmol/L 107  103  104   CO2 22 - 32 mmol/L '28  29  25   '$ Calcium 8.9 - 10.3 mg/dL 9.4  9.3  9.3   Total Protein 6.5 - 8.1 g/dL 6.6   7.4   Total Bilirubin 0.3 - 1.2 mg/dL 1.0   1.0   Alkaline Phos 38 - 126 U/L 57   60   AST 15 - 41 U/L 32   25   ALT 0 - 44 U/L 20   17     DIAGNOSTIC IMAGING:  I have independently reviewed the scans and discussed with the patient.  ASSESSMENT: 1.  Macrocytic anemia and thrombocytopenia: - Patient seen at the request of Dr. Willey Blade for abnormal CBC. - 04/17/2022: WBC 15.8, Hb-9.7, PLT-128 - 03/24/2022: WBC-8.1 (N-43%, L-17%, M-19%, B-2%), Hb-9.6, MCV-99, PLT-115 - Smear review: 13% metamyelocytes, 2% myelocytes, 2% blasts,  - 10/19/2021: WBC-5.3 (N-61, L-21, M-10%), Hb-11, MCV-96, PLT-97 - BMBX (05/25/2022): Hypercellular bone marrow with myeloid hyperplasia with dysgranulopoiesis, erythroid hypoplasia and megakaryocytic hyperplasia with dyspoiesis.  Chromosome analysis was 78, XX.  Bone marrow blasts less than 5%.  Peripheral blood blasts less than 2%. - Mayo molecular model risk stratification: Intermediate 2 risk with at least 2 points (decreased hemoglobin less than 10, circulating immature cells), pending NGS findings.  Intermediate 2 risk with median OS 31 months. - Serum EPO level 41.   2.  Social/family history: - She lives at home by herself.  Son lives next door. - Does part-time work at [employer].  Worked in  text[employer] for 30 years prior to retirement.  Non-smoker. - Maternal aunt had tumor in the breast, patient not certain if it is cancer.  2 maternal first cousins had lymphoma.  Mother had stomach cancer.   PLAN:  1.  Dysplastic CMML-1: - LDH is elevated at 416.  Serum EPO level 41. - BCR/ABL by FISH was negative. - Unfortunately NGS panel is still pending.  We called [facility] and were informed that it will be reported on 08/29/2021. - We will discuss full  prognosis by including NGS panel findings at next visit.  2.  Folate deficiency: - Continue folic acid 1 mg tablet daily.  3.  Weight loss: - She lost 17 pounds since March. - CT CAP (06/21/2022): 1.2 x 1 cm left axillary and subpectoral lymph nodes.  No other lymphadenopathy.  Splenomegaly with maximum span of 14.2 cm. - Left axillary and subpectoral lymph nodes most likely from recent vaccination.  She had RSV vaccine in the left arm on 06/17/2022. - Splenomegaly could be from CMML or lymphoma.  I recommended PET CT scan for further evaluation.   Orders placed this encounter:  Orders Placed This Encounter  Procedures   NM PET Image Initial (PI) Skull Base To Thigh   CBC with Differential/Platelet   Lactate dehydrogenase   Reticulocytes       Derek Jack, MD [place] [phone]

## 2022-06-25 LAB — INTELLIGEN MYELOID

## 2022-06-25 LAB — MISC LABCORP TEST (SEND OUT): Labcorp test code: 451953

## 2022-06-30 ENCOUNTER — Ambulatory Visit (INDEPENDENT_AMBULATORY_CARE_PROVIDER_SITE_OTHER): Payer: Medicare HMO

## 2022-06-30 DIAGNOSIS — J4550 Severe persistent asthma, uncomplicated: Secondary | ICD-10-CM | POA: Diagnosis not present

## 2022-07-08 ENCOUNTER — Inpatient Hospital Stay: Payer: Medicare HMO

## 2022-07-08 ENCOUNTER — Ambulatory Visit (HOSPITAL_COMMUNITY)
Admission: RE | Admit: 2022-07-08 | Discharge: 2022-07-08 | Disposition: A | Discharge status: Home / Self Care | Payer: Medicare HMO | Source: Ambulatory Visit | Attending: Hematology | Admitting: Hematology

## 2022-07-08 DIAGNOSIS — K5900 Constipation, unspecified: Secondary | ICD-10-CM | POA: Diagnosis not present

## 2022-07-08 DIAGNOSIS — R002 Palpitations: Secondary | ICD-10-CM | POA: Diagnosis not present

## 2022-07-08 DIAGNOSIS — R161 Splenomegaly, not elsewhere classified: Secondary | ICD-10-CM | POA: Insufficient documentation

## 2022-07-08 DIAGNOSIS — D539 Nutritional anemia, unspecified: Secondary | ICD-10-CM

## 2022-07-08 DIAGNOSIS — D649 Anemia, unspecified: Secondary | ICD-10-CM | POA: Diagnosis not present

## 2022-07-08 DIAGNOSIS — R110 Nausea: Secondary | ICD-10-CM | POA: Diagnosis not present

## 2022-07-08 DIAGNOSIS — R634 Abnormal weight loss: Secondary | ICD-10-CM | POA: Diagnosis not present

## 2022-07-08 DIAGNOSIS — D696 Thrombocytopenia, unspecified: Secondary | ICD-10-CM

## 2022-07-08 DIAGNOSIS — R0602 Shortness of breath: Secondary | ICD-10-CM | POA: Diagnosis not present

## 2022-07-08 DIAGNOSIS — C9310 Chronic myelomonocytic leukemia not having achieved remission: Secondary | ICD-10-CM | POA: Diagnosis not present

## 2022-07-08 DIAGNOSIS — R42 Dizziness and giddiness: Secondary | ICD-10-CM | POA: Diagnosis not present

## 2022-07-08 LAB — CBC WITH DIFFERENTIAL/PLATELET
Abs Immature Granulocytes: 0.9 10*3/uL — ABNORMAL HIGH (ref 0.00–0.07)
Band Neutrophils: 5 %
Basophils Absolute: 0.1 10*3/uL (ref 0.0–0.1)
Basophils Relative: 1 %
Eosinophils Absolute: 0.1 10*3/uL (ref 0.0–0.5)
Eosinophils Relative: 1 %
HCT: 31 % — ABNORMAL LOW (ref 36.0–46.0)
Hemoglobin: 9.3 g/dL — ABNORMAL LOW (ref 12.0–15.0)
Lymphocytes Relative: 17 %
Lymphs Abs: 1.2 10*3/uL (ref 0.7–4.0)
MCH: 32.3 pg (ref 26.0–34.0)
MCHC: 30 g/dL (ref 30.0–36.0)
MCV: 107.6 fL — ABNORMAL HIGH (ref 80.0–100.0)
Metamyelocytes Relative: 4 %
Monocytes Absolute: 1.7 10*3/uL — ABNORMAL HIGH (ref 0.1–1.0)
Monocytes Relative: 23 %
Myelocytes: 8 %
Neutro Abs: 3.3 10*3/uL (ref 1.7–7.7)
Neutrophils Relative %: 41 %
Platelets: 119 10*3/uL — ABNORMAL LOW (ref 150–400)
RBC: 2.88 MIL/uL — ABNORMAL LOW (ref 3.87–5.11)
RDW: 17 % — ABNORMAL HIGH (ref 11.5–15.5)
WBC: 7.2 10*3/uL (ref 4.0–10.5)
nRBC: 0.4 % — ABNORMAL HIGH (ref 0.0–0.2)

## 2022-07-08 LAB — RETICULOCYTES
Immature Retic Fract: 28.3 % — ABNORMAL HIGH (ref 2.3–15.9)
RBC.: 2.83 MIL/uL — ABNORMAL LOW (ref 3.87–5.11)
Retic Count, Absolute: 101.9 10*3/uL (ref 19.0–186.0)
Retic Ct Pct: 3.6 % — ABNORMAL HIGH (ref 0.4–3.1)

## 2022-07-08 LAB — LACTATE DEHYDROGENASE: LDH: 420 U/L — ABNORMAL HIGH (ref 98–192)

## 2022-07-08 MED ORDER — FLUDEOXYGLUCOSE F - 18 (FDG) INJECTION
7.2600 | Freq: Once | INTRAVENOUS | Status: AC | PRN
  Administered 2022-07-08: 7.26 via INTRAVENOUS

## 2022-07-13 ENCOUNTER — Inpatient Hospital Stay: Payer: Medicare HMO

## 2022-07-13 ENCOUNTER — Inpatient Hospital Stay: Payer: Medicare HMO | Admitting: Hematology

## 2022-07-13 VITALS — BP 110/70 | HR 85 | Temp 98.9°F | Resp 18 | Ht 60.0 in | Wt 133.1 lb

## 2022-07-13 DIAGNOSIS — R002 Palpitations: Secondary | ICD-10-CM | POA: Diagnosis not present

## 2022-07-13 DIAGNOSIS — D539 Nutritional anemia, unspecified: Secondary | ICD-10-CM

## 2022-07-13 DIAGNOSIS — D696 Thrombocytopenia, unspecified: Secondary | ICD-10-CM | POA: Diagnosis not present

## 2022-07-13 DIAGNOSIS — R110 Nausea: Secondary | ICD-10-CM | POA: Diagnosis not present

## 2022-07-13 DIAGNOSIS — K5900 Constipation, unspecified: Secondary | ICD-10-CM | POA: Diagnosis not present

## 2022-07-13 DIAGNOSIS — R0602 Shortness of breath: Secondary | ICD-10-CM | POA: Diagnosis not present

## 2022-07-13 DIAGNOSIS — R42 Dizziness and giddiness: Secondary | ICD-10-CM | POA: Diagnosis not present

## 2022-07-13 DIAGNOSIS — D649 Anemia, unspecified: Secondary | ICD-10-CM | POA: Diagnosis not present

## 2022-07-13 DIAGNOSIS — R634 Abnormal weight loss: Secondary | ICD-10-CM | POA: Diagnosis not present

## 2022-07-13 DIAGNOSIS — C9310 Chronic myelomonocytic leukemia not having achieved remission: Secondary | ICD-10-CM | POA: Diagnosis not present

## 2022-07-13 LAB — CBC WITH DIFFERENTIAL/PLATELET
Abs Immature Granulocytes: 1.7 10*3/uL — ABNORMAL HIGH (ref 0.00–0.07)
Band Neutrophils: 6 %
Basophils Absolute: 0 10*3/uL (ref 0.0–0.1)
Basophils Relative: 0 %
Blasts: 1 %
Eosinophils Absolute: 0.1 10*3/uL (ref 0.0–0.5)
Eosinophils Relative: 1 %
HCT: 30.3 % — ABNORMAL LOW (ref 36.0–46.0)
Hemoglobin: 9.2 g/dL — ABNORMAL LOW (ref 12.0–15.0)
Lymphocytes Relative: 14 %
Lymphs Abs: 1.2 10*3/uL (ref 0.7–4.0)
MCH: 32.3 pg (ref 26.0–34.0)
MCHC: 30.4 g/dL (ref 30.0–36.0)
MCV: 106.3 fL — ABNORMAL HIGH (ref 80.0–100.0)
Metamyelocytes Relative: 4 %
Monocytes Absolute: 2.1 10*3/uL — ABNORMAL HIGH (ref 0.1–1.0)
Monocytes Relative: 25 %
Myelocytes: 13 %
Neutro Abs: 3.3 10*3/uL (ref 1.7–7.7)
Neutrophils Relative %: 33 %
Platelets: 41 10*3/uL — ABNORMAL LOW (ref 150–400)
Promyelocytes Relative: 3 %
RBC: 2.85 MIL/uL — ABNORMAL LOW (ref 3.87–5.11)
RDW: 16.5 % — ABNORMAL HIGH (ref 11.5–15.5)
WBC Morphology: ABNORMAL
WBC: 8.4 10*3/uL (ref 4.0–10.5)
nRBC: 0.2 % (ref 0.0–0.2)

## 2022-07-13 MED ORDER — MEGESTROL ACETATE 400 MG/10ML PO SUSP: 400.0000 mg | Freq: Two times a day (BID) | ORAL | 2 refills | Status: AC

## 2022-07-13 MED ORDER — PROCHLORPERAZINE MALEATE 10 MG PO TABS: 10.0000 mg | ORAL_TABLET | Freq: Four times a day (QID) | ORAL | 0 refills | Status: AC | PRN

## 2022-07-13 NOTE — Patient Instructions (Addendum)
[facility]  Discharge Instructions  You were seen and examined today by Dr. Delton Coombes.  Your NGS Panel has resulted and showed multiple mutations present that are regularly present in CMML. Your hemoglobin is stable. The testing for chronic leukemia is negative.  Dr. Delton Coombes has recommended additional labs to recheck your hemoglobin and to check for myeloproliferative neoplasm. Though you are currently stable, CMML must be monitored and treated as indicated as there is a risk for transformation to acute leukemia.  Your blood counts are not low enough to start any treatments at this point. Dr. Delton Coombes has recommended that you meet with a specialist at [facility] at least once for transplant evaluation and treatment recommendations.  If the treatment is recommended, there are two drug options: Vidaza or Decitabine. Dr. Delton Coombes will discuss this further once indicated.  For your nausea, Dr. Delton Coombes has prescribed Compazine. Please take this each morning while you are on Doxycycline, and then take every 6 hours as needed. For your lack of appetite, Dr. Delton Coombes has prescribed Megace '400mg'$  twice daily. Rarely, Megace can cause lower extremity swelling.  Follow-up as scheduled.  Thank you for choosing [facility] to provide your oncology and hematology care.   To afford each patient quality time with our provider, please arrive at least 15 minutes before your scheduled appointment time. You may need to reschedule your appointment if you arrive late (10 or more minutes). Arriving late affects you and other patients whose appointments are after yours.  Also, if you miss three or more appointments without notifying the office, you may be dismissed from the clinic at the provider's discretion.    Again, thank you for choosing [facility].  Our hope is that these requests will decrease the amount of time that you wait  before being seen by our physicians.   If you have a lab appointment with the [facility] please come in thru the Main Entrance and check in at the main information desk.           _____________________________________________________________  Should you have questions after your visit to [facility], please contact our office at [phone] and follow the prompts.  Our office hours are 8:00 a.m. to 4:30 p.m. Monday - Thursday and 8:00 a.m. to 2:30 p.m. Friday.  Please note that voicemails left after 4:00 p.m. may not be returned until the following business day.  We are closed weekends and all major holidays.  You do have access to a nurse 24-7, just call the main number to the clinic [phone] and do not press any options, hold on the line and a nurse will answer the phone.    For prescription refill requests, have your pharmacy contact our office and allow 72 hours.    Masks are optional in the cancer centers. If you would like for your care team to wear a mask while they are taking care of you, please let them know. You may have one support person who is at least 71 years old accompany you for your appointments.

## 2022-07-13 NOTE — Progress Notes (Signed)
[place], [place] [address]   CLINIC:  Medical Oncology/Hematology  PCP:  Asencion Noble, MD [address] [place] [address] [phone]   REASON FOR VISIT:  CMML  PRIOR THERAPY: None.  NGS Results: CBL, MPL, SRSF2, TET2, RAD21  CURRENT THERAPY: Under work-up.  BRIEF ONCOLOGIC HISTORY:  Oncology History   No history exists.    CANCER STAGING:  Cancer Staging  No matching staging information was found for the patient.   INTERVAL HISTORY:  [name] 71 y.o. female seen for follow-up of CMML.  Reports energy levels of 60%.  Reports decreased appetite.  Lost another 4 pounds since last visit.  She was reportedly started on doxycycline on 07/08/2022 by Dr. Willey Blade for bronchitis.  Since then she has been having nausea and could not eat much.    REVIEW OF SYSTEMS:  Review of Systems  Respiratory:  Positive for cough and shortness of breath.   Gastrointestinal:  Positive for constipation and nausea.  Neurological:  Positive for dizziness and headaches.  All other systems reviewed and are negative.    PAST MEDICAL/SURGICAL HISTORY:  Past Medical History:  Diagnosis Date   Anxiety    Asthma    had 1 episode 1 year ago-no more problem   CHF (congestive heart failure) (HCC)    swelling of feet & ankles   CMML (chronic myelomonocytic leukemia) (HCC) 06/03/2022   Cough    Depression    OCD   GERD (gastroesophageal reflux disease)    severe   OCD (obsessive compulsive disorder)    Shortness of breath    with exertion   Yeast infection 04/09/2014   Past Surgical History:  Procedure Laterality Date   CARPAL TUNNEL RELEASE  03/22/2012   Procedure: CARPAL TUNNEL RELEASE;  Surgeon: Wynonia Sours, MD;  Location: [facility];  Service: Orthopedics;  Laterality: Right;  right carpal tunnel release   COLONOSCOPY  06/19/2012   Dr. Rourk:normal rectum and colon    ESOPHAGOGASTRODUODENOSCOPY N/A 12/26/2015   Dr.  Gala Romney: normal    TONSILLECTOMY     TUBAL LIGATION       SOCIAL HISTORY:  Social History   Socioeconomic History   Marital status: Widowed    Spouse name: Not on file   Number of children: 1   Years of education: Not on file   Highest education level: Not on file  Occupational History   Occupation: retired  Tobacco Use   Smoking status: Never    Passive exposure: Yes   Smokeless tobacco: Never  Vaping Use   Vaping Use: Never used  Substance and Sexual Activity   Alcohol use: No   Drug use: No   Sexual activity: Yes    Birth control/protection: Surgical    Comment: tubal  Other Topics Concern   Not on file  Social History Narrative   Not on file   Social Determinants of Health   Financial Resource Strain: Not on file  Food Insecurity: Not on file  Transportation Needs: Not on file  Physical Activity: Not on file  Stress: Not on file  Social Connections: Not on file  Intimate Partner Violence: Not on file    FAMILY HISTORY:  Family History  Problem Relation Age of Onset   Asthma Mother    Macular degeneration Mother    Hypertension Mother    Alzheimer's disease Father    Other Sister        blood clots  Other Son        enlarged spleen   Diabetes Maternal Grandfather    Diabetes Paternal Grandfather    Colon cancer Neg Hx     CURRENT MEDICATIONS:  Outpatient Encounter Medications as of 07/13/2022  Medication Sig   albuterol (VENTOLIN HFA) 108 (90 Base) MCG/ACT inhaler INHALE 2 PUFFS BY MOUTH EVERY 6 HOURS AS NEEDED FOR SHORTNESS OF BREATH OR WHEEZING.   azelastine (ASTELIN) 0.1 % nasal spray SPRAY 1 TO 2 SPRAYS PER NOSTRIL TWICE DAILY.   BREZTRI AEROSPHERE 160-9-4.8 MCG/ACT AERO INHALE 2 PUFFS BY MOUTH TWICE DAILY   celecoxib (CELEBREX) 100 MG capsule Take 2 capsules (200 mg total) by mouth 2 (two) times daily.   cetirizine (ZYRTEC) 5 MG tablet Take 1 tablet (5 mg total) by mouth daily.   ciclopirox (PENLAC) 8 % solution Apply topically at bedtime.  Apply over nail and surrounding skin. Apply daily over previous coat. After seven (7) days, may remove with alcohol and continue cycle.   cimetidine (TAGAMET) 800 MG tablet TAKE 1/2 TABLET BY MOUTH ONCE OR TWICE DAILY AS NEEDED   dexlansoprazole (DEXILANT) 60 MG capsule Take 1 capsule (60 mg total) by mouth daily.   EPINEPHrine 0.3 mg/0.3 mL IJ SOAJ injection Inject 0.3 mg into the muscle as needed for anaphylaxis.   famotidine (PEPCID) 20 MG tablet Take 20 mg by mouth 2 (two) times daily.   FLUoxetine (PROZAC) 20 MG capsule Take 1 capsule by mouth daily.   fluticasone (FLONASE) 50 MCG/ACT nasal spray Place 2 sprays into both nostrils 2 (two) times daily.   Fluticasone-Umeclidin-Vilant (TRELEGY ELLIPTA) 200-62.5-25 MCG/ACT AEPB Inhale 1 puff into the lungs daily.   folic acid (FOLVITE) 1 MG tablet Take 1 tablet (1 mg total) by mouth daily.   gabapentin (NEURONTIN) 300 MG capsule    ipratropium (ATROVENT) 0.06 % nasal spray USE 2 SPRAYS IN EACH NOSTRIL THREE TIMES A DAY AS NEEDED.   montelukast (SINGULAIR) 10 MG tablet TAKE ONE TABLET BY MOUTH AT BEDTIME.   naproxen (NAPROSYN) 500 MG tablet Take 500 mg by mouth 2 (two) times daily.   NON FORMULARY [facility]  Antifungal (nail)-#1   NON FORMULARY [facility]  Anti-fungal (nail)-#1   omeprazole (PRILOSEC) 20 MG capsule Take 1 capsule (20 mg total) by mouth daily.   oxyCODONE-acetaminophen (PERCOCET/ROXICET) 5-325 MG tablet Take 1 tablet by mouth every 6 (six) hours as needed for severe pain.   pantoprazole (PROTONIX) 40 MG tablet Take 40 mg by mouth daily.   Respiratory Therapy Supplies (FLUTTER) DEVI Use as directed   SF 5000 PLUS 1.1 % CREA dental cream    Spacer/Aero-Holding Chambers (AEROCHAMBER PLUS WITH MASK) inhaler 1 each by Other route See admin instructions. Use with inhaler as instructed.   triamcinolone cream (KENALOG) 0.1 %    Facility-Administered Encounter Medications as of 07/13/2022  Medication    tezepelumab-ekko (TEZSPIRE) 210 MG/1.91ML syringe 210 mg    ALLERGIES:  Allergies  Allergen Reactions   Levonorgestrel-Ethinyl Estrad Cough   Sulfa Antibiotics Other (See Comments)    Unknown- pt unsure of reaction; believes it may be nausea   Sulfamethoxazole-Trimethoprim Other (See Comments)   Cephalosporins Other (See Comments)    Other Reaction: Toxicity     PHYSICAL EXAM:  ECOG Performance status: 1  There were no vitals filed for this visit.  There were no vitals filed for this visit.  Physical Exam Vitals reviewed.  Constitutional:      Appearance: Normal appearance.  Cardiovascular:  Rate and Rhythm: Normal rate and regular rhythm.     Heart sounds: Normal heart sounds.  Pulmonary:     Breath sounds: Normal breath sounds.  Abdominal:     Palpations: Abdomen is soft. There is no mass.  Neurological:     Mental Status: She is alert.  Psychiatric:        Mood and Affect: Mood normal.        Behavior: Behavior normal.     LABORATORY DATA:  I have reviewed the labs as listed.  CBC    Component Value Date/Time   WBC 7.2 07/08/2022 0819   RBC 2.88 (L) 07/08/2022 0819   RBC 2.83 (L) 07/08/2022 0819   HGB 9.3 (L) 07/08/2022 0819   HGB 13.1 07/23/2020 1112   HCT 31.0 (L) 07/08/2022 0819   HCT 40.7 07/23/2020 1112   PLT 119 (L) 07/08/2022 0819   MCV 107.6 (H) 07/08/2022 0819   MCV 95 07/23/2020 1112   MCH 32.3 07/08/2022 0819   MCHC 30.0 07/08/2022 0819   RDW 17.0 (H) 07/08/2022 0819   RDW 14.2 07/23/2020 1112   LYMPHSABS 1.2 07/08/2022 0819   LYMPHSABS 0.7 07/23/2020 1112   MONOABS 1.7 (H) 07/08/2022 0819   EOSABS 0.1 07/08/2022 0819   EOSABS 0.1 07/23/2020 1112   BASOSABS 0.1 07/08/2022 0819   BASOSABS 0.0 07/23/2020 1112      Latest Ref Rng & Units 04/26/2022    9:22 AM 04/17/2022    9:33 AM 02/24/2019    2:15 PM  CMP  Glucose 70 - 99 mg/dL 97  118  97   BUN 8 - 23 mg/dL '16  12  20   '$ Creatinine 0.44 - 1.00 mg/dL 1.03  1.03  0.90   Sodium 135  - 145 mmol/L 141  135  137   Potassium 3.5 - 5.1 mmol/L 3.9  4.1  4.0   Chloride 98 - 111 mmol/L 107  103  104   CO2 22 - 32 mmol/L '28  29  25   '$ Calcium 8.9 - 10.3 mg/dL 9.4  9.3  9.3   Total Protein 6.5 - 8.1 g/dL 6.6   7.4   Total Bilirubin 0.3 - 1.2 mg/dL 1.0   1.0   Alkaline Phos 38 - 126 U/L 57   60   AST 15 - 41 U/L 32   25   ALT 0 - 44 U/L 20   17     DIAGNOSTIC IMAGING:  I have independently reviewed the scans and discussed with the patient.  ASSESSMENT: 1.  Macrocytic anemia and thrombocytopenia: - Patient seen at the request of Dr. Willey Blade for abnormal CBC. - 04/17/2022: WBC 15.8, Hb-9.7, PLT-128 - 03/24/2022: WBC-8.1 (N-43%, L-17%, M-19%, B-2%), Hb-9.6, MCV-99, PLT-115 - Smear review: 13% metamyelocytes, 2% myelocytes, 2% blasts,  - 10/19/2021: WBC-5.3 (N-61, L-21, M-10%), Hb-11, MCV-96, PLT-97 - BMBX (05/25/2022): Hypercellular bone marrow with myeloid hyperplasia with dysgranulopoiesis, erythroid hypoplasia and megakaryocytic hyperplasia with dyspoiesis.  Chromosome analysis was 58, XX.  Bone marrow blasts less than 5%.  Peripheral blood blasts less than 2%. - Mayo molecular model risk stratification: Intermediate 2 risk with at least 2 points (decreased hemoglobin less than 10, circulating immature cells).  Intermediate 2 risk with median OS 31 months. - Serum EPO level 41. - NGS: Positive for CBL, MPL, SRSF2, TET 2, RAD21 - PET scan (07/08/2022): Splenomegaly (volume 510 mm) with normal metabolic activity.  Uniform increase in marrow metabolic activity related to patient's anemia.  No lymphadenopathy.  2.  Social/family history: - She lives at home by herself.  Son lives next door. - Does part-time work at [employer].  Worked in [employer] for 30 years prior to retirement.  Non-smoker. - Maternal aunt had tumor in the breast, patient not certain if it is cancer.  2 maternal first cousins had lymphoma.  Mother had stomach cancer.   PLAN:  1.  Higher risk  dysplastic CMML-1: - We discussed NGS results.  Recommend genetic testing as positive for CBL. - We discussed PET scan results which did not show hypermetabolism.  Splenomegaly likely from CMML. - Recommend JAK2 V617F and reflex testing due to positive MPL on NGS. - ECOG 1 performance status send modified CCI total score 3 - CBC on 07/08/2022: Hb-9.3, PLT-119, WBC-7.2. - We reviewed treatment with hypomethylating agents. - Based on CCI 3, medically frail for transplant.  However, recommend consultation with [facility] Florene Glen at [facility].  2.  Folate deficiency: - Continue folic acid 1 mg tablet daily.  3.  Weight loss: - She has lost about 20 pounds since March. - She is also nauseous since she has been on doxycycline which she will complete in couple of days. - Will start her on Megace for appetite stimulation. - Prescribe Compazine to be taken as needed for nausea.   Orders placed this encounter:  No orders of the defined types were placed in this encounter.      Derek Jack, MD [place] [phone]

## 2022-07-14 ENCOUNTER — Telehealth: Payer: Self-pay | Admitting: *Deleted

## 2022-07-14 NOTE — Telephone Encounter (Signed)
Per Dr. Delton Coombes, patient called and advised that PET scan was good with no abnormal lighting up.  She is good until she sees Dr. Florene Glen.  Her blood work yesterday showed platelet count was low, most likely from antibiotic she is taking.  Verbalized understanding.

## 2022-07-15 DIAGNOSIS — C9310 Chronic myelomonocytic leukemia not having achieved remission: Secondary | ICD-10-CM | POA: Diagnosis not present

## 2022-07-16 DIAGNOSIS — J45909 Unspecified asthma, uncomplicated: Secondary | ICD-10-CM | POA: Diagnosis not present

## 2022-07-16 DIAGNOSIS — Z79899 Other long term (current) drug therapy: Secondary | ICD-10-CM | POA: Diagnosis not present

## 2022-07-16 DIAGNOSIS — C9310 Chronic myelomonocytic leukemia not having achieved remission: Secondary | ICD-10-CM | POA: Diagnosis not present

## 2022-07-16 DIAGNOSIS — F419 Anxiety disorder, unspecified: Secondary | ICD-10-CM | POA: Diagnosis not present

## 2022-07-16 DIAGNOSIS — C9200 Acute myeloblastic leukemia, not having achieved remission: Secondary | ICD-10-CM | POA: Diagnosis not present

## 2022-07-16 DIAGNOSIS — F429 Obsessive-compulsive disorder, unspecified: Secondary | ICD-10-CM | POA: Diagnosis not present

## 2022-07-16 DIAGNOSIS — K219 Gastro-esophageal reflux disease without esophagitis: Secondary | ICD-10-CM | POA: Diagnosis not present

## 2022-07-22 DIAGNOSIS — C9200 Acute myeloblastic leukemia, not having achieved remission: Secondary | ICD-10-CM | POA: Diagnosis not present

## 2022-07-22 LAB — JAK2 V617F RFX CALR/MPL/E12-15

## 2022-07-22 LAB — CALR +MPL + E12-E15  (REFLEX): MPL %: 7.99 %

## 2022-07-26 ENCOUNTER — Inpatient Hospital Stay: Payer: Medicare HMO | Attending: Hematology | Admitting: Hematology

## 2022-07-26 ENCOUNTER — Ambulatory Visit (INDEPENDENT_AMBULATORY_CARE_PROVIDER_SITE_OTHER): Payer: Medicare HMO

## 2022-07-26 ENCOUNTER — Encounter: Payer: Self-pay | Admitting: Hematology

## 2022-07-26 ENCOUNTER — Ambulatory Visit: Payer: Medicare HMO

## 2022-07-26 ENCOUNTER — Ambulatory Visit: Payer: Medicare HMO | Admitting: Podiatry

## 2022-07-26 VITALS — BP 123/71 | HR 69 | Temp 98.3°F | Resp 19 | Ht 60.0 in | Wt 146.1 lb

## 2022-07-26 DIAGNOSIS — Z832 Family history of diseases of the blood and blood-forming organs and certain disorders involving the immune mechanism: Secondary | ICD-10-CM | POA: Diagnosis not present

## 2022-07-26 DIAGNOSIS — Z825 Family history of asthma and other chronic lower respiratory diseases: Secondary | ICD-10-CM | POA: Insufficient documentation

## 2022-07-26 DIAGNOSIS — K5900 Constipation, unspecified: Secondary | ICD-10-CM | POA: Diagnosis not present

## 2022-07-26 DIAGNOSIS — Z882 Allergy status to sulfonamides status: Secondary | ICD-10-CM | POA: Diagnosis not present

## 2022-07-26 DIAGNOSIS — J4550 Severe persistent asthma, uncomplicated: Secondary | ICD-10-CM

## 2022-07-26 DIAGNOSIS — D696 Thrombocytopenia, unspecified: Secondary | ICD-10-CM | POA: Insufficient documentation

## 2022-07-26 DIAGNOSIS — C9310 Chronic myelomonocytic leukemia not having achieved remission: Secondary | ICD-10-CM | POA: Diagnosis not present

## 2022-07-26 DIAGNOSIS — E538 Deficiency of other specified B group vitamins: Secondary | ICD-10-CM | POA: Insufficient documentation

## 2022-07-26 DIAGNOSIS — G479 Sleep disorder, unspecified: Secondary | ICD-10-CM | POA: Diagnosis not present

## 2022-07-26 DIAGNOSIS — R053 Chronic cough: Secondary | ICD-10-CM | POA: Diagnosis not present

## 2022-07-26 DIAGNOSIS — K219 Gastro-esophageal reflux disease without esophagitis: Secondary | ICD-10-CM | POA: Insufficient documentation

## 2022-07-26 DIAGNOSIS — R634 Abnormal weight loss: Secondary | ICD-10-CM | POA: Insufficient documentation

## 2022-07-26 DIAGNOSIS — Z881 Allergy status to other antibiotic agents status: Secondary | ICD-10-CM | POA: Insufficient documentation

## 2022-07-26 DIAGNOSIS — Z8379 Family history of other diseases of the digestive system: Secondary | ICD-10-CM | POA: Diagnosis not present

## 2022-07-26 DIAGNOSIS — Z83518 Family history of other specified eye disorder: Secondary | ICD-10-CM | POA: Diagnosis not present

## 2022-07-26 DIAGNOSIS — Z8249 Family history of ischemic heart disease and other diseases of the circulatory system: Secondary | ICD-10-CM | POA: Insufficient documentation

## 2022-07-26 DIAGNOSIS — Z5111 Encounter for antineoplastic chemotherapy: Secondary | ICD-10-CM | POA: Diagnosis not present

## 2022-07-26 DIAGNOSIS — Z79899 Other long term (current) drug therapy: Secondary | ICD-10-CM | POA: Diagnosis not present

## 2022-07-26 DIAGNOSIS — Z95828 Presence of other vascular implants and grafts: Secondary | ICD-10-CM

## 2022-07-26 DIAGNOSIS — J45909 Unspecified asthma, uncomplicated: Secondary | ICD-10-CM | POA: Diagnosis not present

## 2022-07-26 DIAGNOSIS — R0602 Shortness of breath: Secondary | ICD-10-CM | POA: Insufficient documentation

## 2022-07-26 DIAGNOSIS — Z833 Family history of diabetes mellitus: Secondary | ICD-10-CM | POA: Insufficient documentation

## 2022-07-26 DIAGNOSIS — Z818 Family history of other mental and behavioral disorders: Secondary | ICD-10-CM | POA: Insufficient documentation

## 2022-07-26 HISTORY — DX: Presence of other vascular implants and grafts: Z95.828

## 2022-07-26 MED ORDER — ALLOPURINOL 300 MG PO TABS: 300.0000 mg | ORAL_TABLET | Freq: Every day | ORAL | 0 refills | Status: DC

## 2022-07-26 NOTE — Progress Notes (Signed)
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 

## 2022-07-26 NOTE — Patient Instructions (Addendum)
[place] at [facility] Discharge Instructions   You were seen and examined today by Dr. Delton Coombes.  He reviewed the results of your bone marrow biopsy done at [facility]. 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   He discussed with you starting treatment with an infusion called Vidaza, and a pill called venetoclax.   The Vidaza infusion is given 5 days in a row and then 2 days the following week for a total of 7 days of treatment. This process repeats every 4 weeks.   We will refer you for a port a cath placement. We will give the Vidaza infusions through this. We are also able to obtain your lab work via the port.   You will come in and have an education session with our chemotherapy educator to discuss treatment and side effects in more detail. A pharmacist from [facility] will call you and give you education regarding the venetoclax pill you will be taking.      Thank you for choosing [facility] to provide your oncology and hematology care.  To afford each patient quality time with our provider, please arrive at least 15 minutes before your scheduled appointment time.   If you have a lab appointment with the [facility] please come in thru the Main Entrance and check in at the main information desk.  You need to re-schedule your appointment should you arrive 10 or more minutes late.  We strive to give you quality time with our providers, and arriving late affects you and other patients whose appointments are after yours.  Also, if you no show three or more times for appointments you may be dismissed from the clinic at the providers discretion.     Again, thank you for choosing [facility].  Our hope is that these requests will decrease the amount of time that you wait before being seen by our physicians.        _____________________________________________________________  Should you have questions after your visit to [facility], please contact our office at [phone] and follow the prompts.  Our office hours are 8:00 a.m. and 4:30 p.m. Monday - Friday.  Please note that voicemails left after 4:00 p.m. may not be returned until the following business day.  We are closed weekends and major holidays.  You do have access to a nurse 24-7, just call the main number to the clinic [phone] and do not press any options, hold on the line and a nurse will answer the phone.    For prescription refill requests, have your pharmacy contact our office and allow 72 hours.    Due to Covid, you will need to wear a mask upon entering the hospital. If you do not have a mask, a mask will be given to you at the Main Entrance upon arrival. For doctor visits, patients may have 1 support person age 72 or older with them. For treatment visits, patients can not have anyone with them due to social distancing guidelines and our immunocompromised population.

## 2022-07-26 NOTE — Patient Instructions (Signed)
[place] are diagnosed with myelodysplastic syndrome.  You will be treated in the clinic on days 1-7 (Monday-Friday, then return Monday and Tuesday of the following week).  That is considered one cycle of chemotherapy will repeat every 28 days (from the first day of each treatment).  The drug we will give you is called azacitadine (Vidaza).  The intent of treatment is to control this disease, prevent it from worsening, and to alleviate any symptoms you may be having related to this disease.  You will see the doctor regularly throughout treatment.  We will obtain blood work from you on the first day of treatment and the fifth day of treatment each cycle to monitor your results to make sure it is safe to give your treatment. The doctor monitors your response to treatment by the way you are feeling, your blood work, and by obtaining scans periodically. There will be wait times while you are here for treatment.  It will take about 30 minutes to 1 hour for your lab work to result.  Then there will be wait times while pharmacy mixes your medications.       Medications you will receive in the clinic prior to your chemotherapy medications:   Aloxi:  ALOXI is used in adults to help prevent nausea and vomiting that happens with certain chemotherapy drugs.  Aloxi is a long acting medication, and will remain in your system for about two days.    Dexamethasone:  This is a steroid given prior to chemotherapy to help prevent allergic reactions; it may also help prevent and control nausea and diarrhea.        Azacitidine (Vidaza)   About This Drug Azacitidine is used to treat cancer. It is given in the vein (IV).  It will take 15 minutes to infuse.     Possible Side Effects  Bone marrow suppression. This is a decrease in the number of white blood cells, red blood cells, and platelets. This may raise your risk of infection, make you tired and weak, and raise your  risk of bleeding.    Fever and chills    Nausea and vomiting (throwing up)    Constipation (not able to move bowels)    Diarrhea (loose bowel movements)    Decreased potassium    Weakness    Bruising    Skin and tissue irritation including redness, pain, warmth, or swelling at the injection site.    Petechiae. Tiny red spots on the skin, often from low platelets.   Note: Each of the side effects above was reported in 30% or greater of patients treated with azacitidine. Not all possible side effects are included above.     Warnings and Precautions    Risk of changes in your liver function if you have underlying liver disease.    Changes in your kidney function, which can cause kidney failure and be life-threatening.    Tumor lysis syndrome which can be life-threatening: This drug may act on the cancer cells very quickly. This may affect how your kidneys work.     Important Information    This drug may be present in the saliva, tears, sweat, urine, stool, vomit, semen, and vaginal secretions. Talk to your doctor and/or your nurse about the necessary precautions to take during this time.     Treating Side Effects  Manage tiredness by pacing your activities for the day.    Be sure to include periods  of rest between energy-draining activities.    To help decrease the risk of infections, wash your hands regularly.    Avoid close contact with people who have a cold, the flu, or other infections.    Take your temperature as your doctor or nurse tells you, and whenever you feel like you may have a fever.    To help decrease the risk of bleeding, use a soft toothbrush. Check with your nurse before using dental floss.    Be very careful when using knives or tools.    Use an electric shaver instead of a razor.    Drink plenty of fluids (a minimum of eight glasses per day is recommended).    If you throw up or have diarrhea, you should drink more fluids so that you do not  become dehydrated (lack of water in the body from losing too much fluid).    To help with nausea and vomiting, eat small, frequent meals instead of three large meals a day. Choose foods and drinks that are at room temperature. Ask your nurse or doctor about other helpful tips and medicine that is available to help stop or lessen these symptoms.    If you have diarrhea, eat low-fiber foods that are high in protein and calories and avoid foods that can irritate your digestive tracts or lead to cramping.    If you are not able to move your bowels, check with your doctor or nurse before you use enemas, laxatives, or suppositories.    Ask your nurse or doctor about medicine that can lessen or stop your diarrhea and/or constipation.   Food and Drug Interactions  There are no known interactions of azacitidine with food.    This drug may interact with other medicines. Tell your doctor and pharmacist about all the medicines and dietary supplements (vitamins, minerals, herbs, and others) that you are taking at this time. Also, check with your doctor or pharmacist before starting any new prescription or over-the-counter medicines, or dietary supplements to make sure that there are no interactions.   When to Call the Doctor   Call your doctor or nurse if you have any of these symptoms and/or any new or unusual symptoms:  Fever of 100.4 F (38 C) or higher    Chills    Tiredness that interferes with your daily activities    Feeling dizzy or lightheaded    Easy bleeding or bruising    Nausea that stops you from eating or drinking and/or is not relieved by prescribed medicines    Throwing up    Diarrhea, 4 times in one day or diarrhea with lack of strength or a feeling of being dizzy    No bowel movement in 3 days or when you feel uncomfortable    Pain, redness, or swelling at the site of the injection    Any new tiny red spots on the skin    Decreased and/or dark urine    Signs of  possible liver problems: dark urine, pale bowel movements, pain in your abdomen, feeling very tired and weak, unusual itching, or yellowing of the eyes or skin    Signs of tumor lysis: confusion or agitation, decreased urine, nausea/vomiting, diarrhea, muscle cramping, numbness and/or tingling, seizures    If you think you may be pregnant or may have impregnated your partner   Reproduction Warnings  Pregnancy warning: This drug can have harmful effects on the unborn baby. Women of childbearing potential and men with female partners  of childbearing potential should use effective methods of birth control during your cancer treatment. Let your doctor know right away if you think you may be pregnant or may have impregnated your partner.    Breastfeeding warning: It is not known if this drug passes into breast milk. Women should not breastfeed during treatment because this drug could enter the breast milk and cause harm to a breastfeeding baby.    Fertility warning: In men and women both, this drug may affect your ability to have children in the future. Talk with your doctor or nurse if you plan to have children. Ask for information on sperm or egg banking.     SELF CARE ACTIVITIES WHILE RECEIVING CHEMOTHERAPY:   Hydration Increase your fluid intake and drink at least 8 to 12 cups (64 ounces) of water/decaffeinated beverages per day after treatment. You can still have your cup of coffee or soda but these beverages do not count as part of your 8 to 12 cups that you need to drink daily. No alcohol intake.   Medications Continue taking your normal prescription medication as prescribed.  If you start any new herbal or new supplements please let us know first to make sure it is safe.   Mouth Care Have teeth cleaned professionally before starting treatment. Keep dentures and partial plates clean. Use soft toothbrush and do not use mouthwashes that contain alcohol. Biotene is a good mouthwash that is  available at most pharmacies or may be ordered by calling [phone]. Use warm salt water gargles (1 teaspoon salt per 1 quart warm water) before and after meals and at bedtime. If you need dental work, please let the doctor know before you go for your appointment so that we can coordinate the best possible time for you in regards to your chemo regimen. You need to also let your dentist know that you are actively taking chemo. We may need to do labs prior to your dental appointment.   Skin Care Always use sunscreen that has not expired and with SPF (Sun Protection Factor) of 50 or higher. Wear hats to protect your head from the sun. Remember to use sunscreen on your hands, ears, face, & feet.  Use good moisturizing lotions such as udder cream, eucerin, or even Vaseline. Some chemotherapies can cause dry skin, color changes in your skin and nails.     Avoid long, hot showers or baths. Use gentle, fragrance-free soaps and laundry detergent. Use moisturizers, preferably creams or ointments rather than lotions because the thicker consistency is better at preventing skin dehydration. Apply the cream or ointment within 15 minutes of showering. Reapply moisturizer at night, and moisturize your hands every time after you wash them.   Hair Loss (if your doctor says your hair will fall out)   If your doctor says that your hair is likely to fall out, decide before you begin chemo whether you want to wear a wig. You may want to shop before treatment to match your hair color. Hats, turbans, and scarves can also camouflage hair loss, although some people prefer to leave their heads uncovered. If you go bare-headed outdoors, be sure to use sunscreen on your scalp. Cut your hair short. It eases the inconvenience of shedding lots of hair, but it also can reduce the emotional impact of watching your hair fall out. Don't perm or color your hair during chemotherapy. Those chemical treatments are already damaging to  hair and can enhance hair loss. Once your chemo treatments are  done and your hair has grown back, it's OK to resume dyeing or perming hair.   With chemotherapy, hair loss is almost always temporary. But when it grows back, it may be a different color or texture. In older adults who still had hair color before chemotherapy, the new growth may be completely gray.  Often, new hair is very fine and soft.   Infection Prevention Please wash your hands for at least 30 seconds using warm soapy water. Handwashing is the #1 way to prevent the spread of germs. Stay away from sick people or people who are getting over a cold. If you develop respiratory systems such as green/yellow mucus production or productive cough or persistent cough let us know and we will see if you need an antibiotic. It is a good idea to keep a pair of gloves on when going into grocery stores/[employer] to decrease your risk of coming into contact with germs on the carts, etc. Carry alcohol hand gel with you at all times and use it frequently if out in public. If your temperature reaches 100.4 or higher please call the clinic and let us know.  If it is after hours or on the weekend please go to the ER if your temperature is over 100.4.  Please have your own personal thermometer at home to use.     Sex and bodily fluids If you are going to have sex, a condom must be used to protect the person that isn't taking chemotherapy. Chemo can decrease your libido (sex drive). For a few days after chemotherapy, chemotherapy can be excreted through your bodily fluids.  When using the toilet please close the lid and flush the toilet twice.  Do this for a few day after you have had chemotherapy.    Effects of chemotherapy on your sex life Some changes are simple and won't last long. They won't affect your sex life permanently.   Sometimes you may feel: too tired not strong enough to be very active sick or sore  not in the mood anxious or low   Your  anxiety might not seem related to sex. For example, you may be worried about the cancer and how your treatment is going. Or you may be worried about money, or about how you family are coping with your illness.  These things can cause stress, which can affect your interest in sex. It's important to talk to your partner about how you feel.  Remember - the changes to your sex life don't usually last long. There's usually no medical reason to stop having sex during chemo. The drugs won't have any long term physical effects on your performance or enjoyment of sex. Cancer can't be passed on to your partner during sex   Contraception It's important to use reliable contraception during treatment. Avoid getting pregnant while you or your partner are having chemotherapy. This is because the drugs may harm the baby. Sometimes chemotherapy drugs can leave a man or woman infertile.  This means you would not be able to have children in the future. You might want to talk to someone about permanent infertility. It can be very difficult to learn that you may no longer be able to have children. Some people find counselling helpful. There might be ways to preserve your fertility, although this is easier for men than for women. You may want to speak to a fertility expert. You can talk about sperm banking or harvesting your eggs. You can also ask about other  fertility options, such as donor eggs. If you have or have had breast cancer, your doctor might advise you not to take the contraceptive pill. This is because the hormones in it might affect the cancer. It is not known for sure whether or not chemotherapy drugs can be passed on through semen or secretions from the vagina. Because of this some doctors advise people to use a barrier method if you have sex during treatment. This applies to vaginal, anal or oral sex. Generally, doctors advise a barrier method only for the time you are actually having the treatment and for about a  week after your treatment. Advice like this can be worrying, but this does not mean that you have to avoid being intimate with your partner. You can still have close contact with your partner and continue to enjoy sex.   Animals If you have cats or birds we just ask that you not change the litter or change the cage.  Please have someone else do this for you while you are on chemotherapy.    Food Safety During and After Cancer Treatment Food safety is important for people both during and after cancer treatment. Cancer and cancer treatments, such as chemotherapy, radiation therapy, and stem cell/bone marrow transplantation, often weaken the immune system. This makes it harder for your body to protect itself from foodborne illness, also called food poisoning. Foodborne illness is caused by eating food that contains harmful bacteria, parasites, or viruses.   Foods to avoid Some foods have a higher risk of becoming tainted with bacteria. These include: Unwashed fresh fruit and vegetables, especially leafy vegetables that can hide dirt and other contaminants Raw sprouts, such as alfalfa sprouts Raw or undercooked beef, especially ground beef, or other raw or undercooked meat and poultry Fatty, fried, or spicy foods immediately before or after treatment.  These can sit heavy on your stomach and make you feel nauseous. Raw or undercooked shellfish, such as oysters. Sushi and sashimi, which often contain raw fish.  Unpasteurized beverages, such as unpasteurized fruit juices, raw milk, raw yogurt, or cider Undercooked eggs, such as soft boiled, over easy, and poached; raw, unpasteurized eggs; or foods made with raw egg, such as homemade raw cookie dough and homemade mayonnaise   Simple steps for food safety   Shop smart. Do not buy food stored or displayed in an unclean area. Do not buy bruised or damaged fruits or vegetables. Do not buy cans that have cracks, dents, or bulges. Pick up foods that can  spoil at the end of your shopping trip and store them in a cooler on the way home.   Prepare and clean up foods carefully. Rinse all fresh fruits and vegetables under running water, and dry them with a clean towel or paper towel. Clean the top of cans before opening them. After preparing food, wash your hands for 20 seconds with hot water and soap. Pay special attention to areas between fingers and under nails. Clean your utensils and dishes with hot water and soap. Disinfect your kitchen and cutting boards using 1 teaspoon of liquid, unscented bleach mixed into 1 quart of water.     Dispose of old food. Eat canned and packaged food before its expiration date (the "use by" or "best before" date). Consume refrigerated leftovers within 3 to 4 days. After that time, throw out the food. Even if the food does not smell or look spoiled, it still may be unsafe. Some bacteria, such as Listeria, can grow  even on foods stored in the refrigerator if they are kept for too long.   Take precautions when eating out. At restaurants, avoid buffets and salad bars where food sits out for a long time and comes in contact with many people. Food can become contaminated when someone with a virus, often a norovirus, or another "bug" handles it. Put any leftover food in a "to-go" container yourself, rather than having the server do it. And, refrigerate leftovers as soon as you get home. Choose restaurants that are clean and that are willing to prepare your food as you order it cooked.     AT HOME MEDICATIONS:                                                                                                                                                                 Compazine/Prochlorperazine '10mg'$  tablet. Take 1 tablet every 6 hours as needed for nausea/vomiting. (This can make you sleepy)     EMLA cream. Apply a quarter size amount to port site 1 hour prior to chemo. Do not rub in. Cover with plastic wrap.        Diarrhea Sheet    If you are having loose stools/diarrhea, please purchase Imodium and begin taking as outlined:  At the first sign of poorly formed or loose stools you should begin taking Imodium (loperamide) 2 mg capsules.  Take two tablets ('4mg'$ ) followed by one tablet ('2mg'$ ) every 2 hours - DO NOT EXCEED 8 tablets in 24 hours.  If it is bedtime and you are having loose stools, take 2 tablets at bedtime, then 2 tablets every 4 hours until morning.    Always call the [facility] if you are having loose stools/diarrhea that you can't get under control.  Loose stools/diarrhea leads to dehydration (loss of water) in your body.  We have other options of trying to get the loose stools/diarrhea to stop but you must let us know!     Constipation Sheet   Colace - 100 mg capsules - take 2 capsules daily.  If this doesn't help then you can increase to 2 capsules twice daily.  Please call if the above does not work for you. Do not go more than 2 days without a bowel movement.  It is very important that you do not become constipated.  It will make you feel sick to your stomach (nausea) and can cause abdominal pain and vomiting.   Nausea Sheet    Compazine/Prochlorperazine '10mg'$  tablet. Take 1 tablet every 6 hours as needed for nausea/vomiting (This can make you drowsy).   If you are having persistent nausea (nausea that does not stop) please call the [facility] and let us know the amount of nausea that you are experiencing.  If you  begin to vomit, you need to call the [facility] and if it is the weekend and you have vomited more than one time and can't get it to stop-go to the Emergency Room.  Persistent nausea/vomiting can lead to dehydration (loss of fluid in your body) and will make you feel very weak and unwell. Ice chips, sips of clear liquids, foods that are at room temperature, crackers, and toast tend to be better tolerated.     SYMPTOMS TO REPORT AS SOON AS POSSIBLE AFTER TREATMENT:   FEVER  GREATER THAN 100.4 F   CHILLS WITH OR WITHOUT FEVER   NAUSEA AND VOMITING THAT IS NOT CONTROLLED WITH YOUR NAUSEA MEDICATION   UNUSUAL SHORTNESS OF BREATH   UNUSUAL BRUISING OR BLEEDING   TENDERNESS IN MOUTH AND THROAT WITH OR WITHOUT PRESENCE OF ULCERS   URINARY PROBLEMS   BOWEL PROBLEMS   UNUSUAL RASH         Wear comfortable clothing and clothing appropriate for easy access to any Portacath or PICC line. Let us know if there is anything that we can do to make your therapy better!       What to do if you need assistance after hours or on the weekends: CALL [phone].  HOLD on the line, do not hang up.  You will hear multiple messages but at the end you will be connected with a nurse triage line.  They will contact the doctor if necessary.  Most of the time they will be able to assist you.  Do not call the hospital operator.         I have been informed and understand all of the instructions given to me and have received a copy. I have been instructed to call the clinic [phone] or my family physician as soon as possible for continued medical care, if indicated. I do not have any more questions at this time but understand that I may call the [facility] or the Patient Navigator at [phone] during office hours should I have questions or need assistance in obtaining follow-up care.

## 2022-07-26 NOTE — Progress Notes (Signed)
[place], [place] [id number]   CLINIC:  Medical Oncology/Hematology  PCP:  Asencion Noble, [place] [id number] [phone]   REASON FOR VISIT:  CMML-2  PRIOR THERAPY: None.  NGS Results: CBL, MPL, SRSF2, TET2, RAD21  CURRENT THERAPY: Azacitidine 70 mg/m x 7 days every 28 days  BRIEF ONCOLOGIC HISTORY:  Oncology History   No history exists.    CANCER STAGING:  Cancer Staging  No matching staging information was found for the patient.   INTERVAL HISTORY:  [name] 72 y.o. female seen for follow-up of CMML.  She was evaluated by Dr. Florene Glen at [facility] and a repeat bone marrow was performed on 07/16/2022.  She reports that she has been eating well since she was started on Megace and has gained about 13 pounds.  Chronic cough and shortness of breath from asthma are stable.    REVIEW OF SYSTEMS:  Review of Systems  Respiratory:  Positive for cough and shortness of breath.   Gastrointestinal:  Positive for constipation.  Psychiatric/Behavioral:  Positive for sleep disturbance.   All other systems reviewed and are negative.    PAST MEDICAL/SURGICAL HISTORY:  Past Medical History:  Diagnosis Date   Anxiety    Asthma    had 1 episode 1 year ago-no more problem   CHF (congestive heart failure) (HCC)    swelling of feet & ankles   CMML (chronic myelomonocytic leukemia) (HCC) 06/03/2022   Cough    Depression    OCD   GERD (gastroesophageal reflux disease)    severe   OCD (obsessive compulsive disorder)    Shortness of breath    with exertion   Yeast infection 04/09/2014   Past Surgical History:  Procedure Laterality Date   CARPAL TUNNEL RELEASE  03/22/2012   Procedure: CARPAL TUNNEL RELEASE;  Surgeon: Wynonia Sours, MD;  Location: [facility];  Service: Orthopedics;  Laterality: Right;  right carpal tunnel release   COLONOSCOPY  06/19/2012   Dr. Rourk:normal rectum and  colon    ESOPHAGOGASTRODUODENOSCOPY N/A 12/26/2015   Dr. Gala Romney: normal    TONSILLECTOMY     TUBAL LIGATION       SOCIAL HISTORY:  Social History   Socioeconomic History   Marital status: Widowed    Spouse name: Not on file   Number of children: 1   Years of education: Not on file   Highest education level: Not on file  Occupational History   Occupation: retired  Tobacco Use   Smoking status: Never    Passive exposure: Yes   Smokeless tobacco: Never  Vaping Use   Vaping Use: Never used  Substance and Sexual Activity   Alcohol use: No   Drug use: No   Sexual activity: Yes    Birth control/protection: Surgical    Comment: tubal  Other Topics Concern   Not on file  Social History Narrative   Not on file   Social Determinants of Health   Financial Resource Strain: Not on file  Food Insecurity: Not on file  Transportation Needs: Not on file  Physical Activity: Not on file  Stress: Not on file  Social Connections: Not on file  Intimate Partner Violence: Not on file    FAMILY HISTORY:  Family History  Problem Relation Age of Onset   Asthma Mother    Macular degeneration Mother    Hypertension Mother    Alzheimer's disease Father  Other Sister        blood clots   Other Son        enlarged spleen   Diabetes Maternal Grandfather    Diabetes Paternal Grandfather    Colon cancer Neg Hx     CURRENT MEDICATIONS:  Outpatient Encounter Medications as of 07/26/2022  Medication Sig   albuterol (VENTOLIN HFA) 108 (90 Base) MCG/ACT inhaler INHALE 2 PUFFS BY MOUTH EVERY 6 HOURS AS NEEDED FOR SHORTNESS OF BREATH OR WHEEZING.   azelastine (ASTELIN) 0.1 % nasal spray SPRAY 1 TO 2 SPRAYS PER NOSTRIL TWICE DAILY.   BREZTRI AEROSPHERE 160-9-4.8 MCG/ACT AERO INHALE 2 PUFFS BY MOUTH TWICE DAILY   celecoxib (CELEBREX) 100 MG capsule Take 2 capsules (200 mg total) by mouth 2 (two) times daily.   cetirizine (ZYRTEC) 5 MG tablet Take 1 tablet (5 mg total) by mouth daily.    ciclopirox (PENLAC) 8 % solution Apply topically at bedtime. Apply over nail and surrounding skin. Apply daily over previous coat. After seven (7) days, may remove with alcohol and continue cycle.   cimetidine (TAGAMET) 800 MG tablet TAKE 1/2 TABLET BY MOUTH ONCE OR TWICE DAILY AS NEEDED   dexlansoprazole (DEXILANT) 60 MG capsule Take 1 capsule (60 mg total) by mouth daily.   doxycycline (VIBRAMYCIN) 100 MG capsule Take 100 mg by mouth 2 (two) times daily.   EPINEPHrine 0.3 mg/0.3 mL IJ SOAJ injection Inject 0.3 mg into the muscle as needed for anaphylaxis.   famotidine (PEPCID) 20 MG tablet Take 20 mg by mouth 2 (two) times daily.   FLUoxetine (PROZAC) 20 MG capsule Take 1 capsule by mouth daily.   fluticasone (FLONASE) 50 MCG/ACT nasal spray Place 2 sprays into both nostrils 2 (two) times daily.   Fluticasone-Umeclidin-Vilant (TRELEGY ELLIPTA) 200-62.5-25 MCG/ACT AEPB Inhale 1 puff into the lungs daily.   folic acid (FOLVITE) 1 MG tablet Take 1 tablet (1 mg total) by mouth daily.   gabapentin (NEURONTIN) 300 MG capsule    ipratropium (ATROVENT) 0.06 % nasal spray USE 2 SPRAYS IN EACH NOSTRIL THREE TIMES A DAY AS NEEDED.   megestrol (MEGACE) 400 MG/10ML suspension Take 10 mLs (400 mg total) by mouth 2 (two) times daily.   montelukast (SINGULAIR) 10 MG tablet TAKE ONE TABLET BY MOUTH AT BEDTIME.   naproxen (NAPROSYN) 500 MG tablet Take 500 mg by mouth 2 (two) times daily.   NON FORMULARY [facility]  Antifungal (nail)-#1   NON FORMULARY [facility]  Anti-fungal (nail)-#1   omeprazole (PRILOSEC) 20 MG capsule Take 1 capsule (20 mg total) by mouth daily.   oxyCODONE-acetaminophen (PERCOCET/ROXICET) 5-325 MG tablet Take 1 tablet by mouth every 6 (six) hours as needed for severe pain.   pantoprazole (PROTONIX) 40 MG tablet Take 40 mg by mouth daily.   prochlorperazine (COMPAZINE) 10 MG tablet Take 1 tablet (10 mg total) by mouth every 6 (six) hours as needed for nausea or  vomiting.   Respiratory Therapy Supplies (FLUTTER) DEVI Use as directed   SF 5000 PLUS 1.1 % CREA dental cream    Spacer/Aero-Holding Chambers (AEROCHAMBER PLUS WITH MASK) inhaler 1 each by Other route See admin instructions. Use with inhaler as instructed.   triamcinolone cream (KENALOG) 0.1 %    Facility-Administered Encounter Medications as of 07/26/2022  Medication   tezepelumab-ekko (TEZSPIRE) 210 MG/1.91ML syringe 210 mg    ALLERGIES:  Allergies  Allergen Reactions   Levonorgestrel-Ethinyl Estrad Cough   Sulfa Antibiotics Other (See Comments)    Unknown- pt unsure  of reaction; believes it may be nausea   Sulfamethoxazole-Trimethoprim Other (See Comments)   Cephalosporins Other (See Comments)    Other Reaction: Toxicity     PHYSICAL EXAM:  ECOG Performance status: 1  Vitals:   07/26/22 1126  BP: 123/71  Pulse: 69  Resp: 19  Temp: 98.3 F (36.8 C)  SpO2: 100%    Filed Weights   07/26/22 1126  Weight: 146 lb 1.6 oz (66.3 kg)    Physical Exam Vitals reviewed.  Constitutional:      Appearance: Normal appearance.  Cardiovascular:     Rate and Rhythm: Normal rate and regular rhythm.     Heart sounds: Normal heart sounds.  Pulmonary:     Breath sounds: Normal breath sounds.  Abdominal:     Palpations: Abdomen is soft. There is no mass.  Neurological:     Mental Status: She is alert.  Psychiatric:        Mood and Affect: Mood normal.        Behavior: Behavior normal.     LABORATORY DATA:  I have reviewed the labs as listed.  CBC    Component Value Date/Time   WBC 8.4 07/13/2022 0907   RBC 2.85 (L) 07/13/2022 0907   HGB 9.2 (L) 07/13/2022 0907   HGB 13.1 07/23/2020 1112   HCT 30.3 (L) 07/13/2022 0907   HCT 40.7 07/23/2020 1112   PLT 41 (L) 07/13/2022 0907   MCV 106.3 (H) 07/13/2022 0907   MCV 95 07/23/2020 1112   MCH 32.3 07/13/2022 0907   MCHC 30.4 07/13/2022 0907   RDW 16.5 (H) 07/13/2022 0907   RDW 14.2 07/23/2020 1112   LYMPHSABS 1.2  07/13/2022 0907   LYMPHSABS 0.7 07/23/2020 1112   MONOABS 2.1 (H) 07/13/2022 0907   EOSABS 0.1 07/13/2022 0907   EOSABS 0.1 07/23/2020 1112   BASOSABS 0.0 07/13/2022 0907   BASOSABS 0.0 07/23/2020 1112      Latest Ref Rng & Units 04/26/2022    9:22 AM 04/17/2022    9:33 AM 02/24/2019    2:15 PM  CMP  Glucose 70 - 99 mg/dL 97  118  97   BUN 8 - 23 mg/dL '16  12  20   '$ Creatinine 0.44 - 1.00 mg/dL 1.03  1.03  0.90   Sodium 135 - 145 mmol/L 141  135  137   Potassium 3.5 - 5.1 mmol/L 3.9  4.1  4.0   Chloride 98 - 111 mmol/L 107  103  104   CO2 22 - 32 mmol/L '28  29  25   '$ Calcium 8.9 - 10.3 mg/dL 9.4  9.3  9.3   Total Protein 6.5 - 8.1 g/dL 6.6   7.4   Total Bilirubin 0.3 - 1.2 mg/dL 1.0   1.0   Alkaline Phos 38 - 126 U/L 57   60   AST 15 - 41 U/L 32   25   ALT 0 - 44 U/L 20   17     DIAGNOSTIC IMAGING:  I have independently reviewed the scans and discussed with the patient.  ASSESSMENT: 1.  Macrocytic anemia and thrombocytopenia: - Patient seen at the request of Dr. Willey Blade for abnormal CBC. - 04/17/2022: WBC 15.8, Hb-9.7, PLT-128 - 03/24/2022: WBC-8.1 (N-43%, L-17%, M-19%, B-2%), Hb-9.6, MCV-99, PLT-115 - Smear review: 13% metamyelocytes, 2% myelocytes, 2% blasts,  - 10/19/2021: WBC-5.3 (N-61, L-21, M-10%), Hb-11, MCV-96, PLT-97 - BMBX (05/25/2022): Hypercellular bone marrow with myeloid hyperplasia with dysgranulopoiesis, erythroid hypoplasia and megakaryocytic hyperplasia with  dyspoiesis.  Chromosome analysis was 16, XX.  Bone marrow blasts less than 5%.  Peripheral blood blasts less than 2%. - Mayo molecular model risk stratification: Intermediate 2 risk with at least 2 points (decreased hemoglobin less than 10, circulating immature cells).  Intermediate 2 risk with median OS 31 months. - Serum EPO level 41. - NGS: Positive for CBL, MPL, SRSF2, TET 2, RAD21 - PET scan (07/08/2022): Splenomegaly (volume 510 mm) with normal metabolic activity.  Uniform increase in marrow metabolic  activity related to patient's anemia.  No lymphadenopathy. - BMBX (07/16/2022): Hypercellular bone marrow (95-100%) with myeloid hyperplasia, atypical monocytes, increased blasts (16% overall).  Atypical monocytosis present 23% of total events, expressing HLA-DR, CD38, CD4 dim, CD11C, CD13, CD14, CD36, CD64 with aberrant coexpression of CD56 and CD7.   2.  Social/family history: - She lives at home by herself.  Son lives next door. - Does part-time work at [employer].  Worked in [place] for 30 years prior to retirement.  Non-smoker. - Maternal aunt had tumor in the breast, patient not certain if it is cancer.  2 maternal first cousins had lymphoma.  Mother had stomach cancer.   PLAN:  1.  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 - I talked to Dr. Jerrye Noble.  We will proceed with azacitidine 75 mg/m for 7 days every 28 days for 2 cycles.  She will get another bone marrow biopsy done at [facility]  If there is any suboptimal response, will add venetoclax. - We discussed the schedule and side effects of azacitidine. - We will schedule for port placement. - She will start cycle 1 on 08/02/2022.  I will reevaluate her on 08/16/2022 for nadir counts.  2.  Folate deficiency: -Continue folic acid 1 mg daily.  3.  Weight loss: -She is taking Megace twice daily.  She gained 13 pounds. - She will cut back to Megace once daily.  4.  TLS prophylaxis: - Will start her on allopurinol 300 mg daily.   Orders placed this encounter:  No orders of the defined types were placed in this encounter.      Derek Jack, MD [place] [phone]

## 2022-07-27 ENCOUNTER — Other Ambulatory Visit: Payer: Self-pay

## 2022-07-28 ENCOUNTER — Other Ambulatory Visit: Payer: Self-pay | Admitting: Radiology

## 2022-07-28 ENCOUNTER — Other Ambulatory Visit: Payer: Self-pay | Admitting: Hematology

## 2022-07-28 ENCOUNTER — Other Ambulatory Visit: Payer: Self-pay | Admitting: *Deleted

## 2022-07-28 ENCOUNTER — Telehealth: Payer: Self-pay | Admitting: *Deleted

## 2022-07-28 DIAGNOSIS — G4700 Insomnia, unspecified: Secondary | ICD-10-CM

## 2022-07-28 DIAGNOSIS — C9310 Chronic myelomonocytic leukemia not having achieved remission: Secondary | ICD-10-CM

## 2022-07-28 DIAGNOSIS — Z95828 Presence of other vascular implants and grafts: Secondary | ICD-10-CM

## 2022-07-28 MED ORDER — TRAZODONE HCL 50 MG PO TABS: 50.0000 mg | ORAL_TABLET | Freq: Every day | ORAL | 1 refills | Status: DC

## 2022-07-28 NOTE — Progress Notes (Signed)
Pharmacist Chemotherapy Monitoring - Initial Assessment    Anticipated start date: 08/02/22   The following has been reviewed per standard work regarding the patient's treatment regimen: The patient's diagnosis, treatment plan and drug doses, and organ/hematologic function Lab orders and baseline tests specific to treatment regimen  The treatment plan start date, drug sequencing, and pre-medications Prior authorization status  Patient's documented medication list, including drug-drug interaction screen and prescriptions for anti-emetics and supportive care specific to the treatment regimen The drug concentrations, fluid compatibility, administration routes, and timing of the medications to be used The patient's access for treatment and lifetime cumulative dose history, if applicable  The patient's medication allergies and previous infusion related reactions, if applicable   Changes made to treatment plan:  Per MD this should be IVPB not SQ plan - orders changed to include Vidaza 75 mg/m2 IVPB on days 1-9.  Plan updated.  Follow up needed:  N/A   Wynona Neat, [facility], 07/28/2022  11:29 AM

## 2022-07-28 NOTE — Telephone Encounter (Signed)
Patient called c/o profound insomnia.  Per Dr. Delton Coombes, will send in Trazodone 50 mg for her to take at bedtime.  Patient aware.

## 2022-07-29 ENCOUNTER — Inpatient Hospital Stay: Payer: Medicare HMO

## 2022-07-29 ENCOUNTER — Other Ambulatory Visit: Payer: Self-pay | Admitting: Radiology

## 2022-07-29 ENCOUNTER — Other Ambulatory Visit: Payer: Self-pay | Admitting: *Deleted

## 2022-07-29 DIAGNOSIS — C9310 Chronic myelomonocytic leukemia not having achieved remission: Secondary | ICD-10-CM

## 2022-07-29 DIAGNOSIS — Z95828 Presence of other vascular implants and grafts: Secondary | ICD-10-CM

## 2022-07-29 MED ORDER — PROCHLORPERAZINE MALEATE 10 MG PO TABS: 10.0000 mg | ORAL_TABLET | Freq: Four times a day (QID) | ORAL | 3 refills | Status: DC | PRN

## 2022-07-29 MED ORDER — LIDOCAINE-PRILOCAINE 2.5-2.5 % EX CREA: TOPICAL_CREAM | CUTANEOUS | 3 refills | Status: AC

## 2022-07-29 MED ORDER — LACTULOSE 20 GM/30ML PO SOLN: 30.0000 mL | Freq: Every day | ORAL | 2 refills | Status: DC | PRN

## 2022-07-29 NOTE — Progress Notes (Signed)

## 2022-07-30 ENCOUNTER — Other Ambulatory Visit: Payer: Self-pay

## 2022-07-30 ENCOUNTER — Ambulatory Visit (HOSPITAL_COMMUNITY)
Admission: RE | Admit: 2022-07-30 | Discharge: 2022-07-30 | Disposition: A | Discharge status: Home / Self Care | Payer: Medicare HMO | Source: Ambulatory Visit | Attending: Hematology | Admitting: Hematology

## 2022-07-30 ENCOUNTER — Encounter (HOSPITAL_COMMUNITY): Payer: Self-pay

## 2022-07-30 DIAGNOSIS — C9210 Chronic myeloid leukemia, BCR/ABL-positive, not having achieved remission: Secondary | ICD-10-CM | POA: Diagnosis not present

## 2022-07-30 DIAGNOSIS — K219 Gastro-esophageal reflux disease without esophagitis: Secondary | ICD-10-CM | POA: Diagnosis not present

## 2022-07-30 DIAGNOSIS — I509 Heart failure, unspecified: Secondary | ICD-10-CM | POA: Insufficient documentation

## 2022-07-30 DIAGNOSIS — C9310 Chronic myelomonocytic leukemia not having achieved remission: Secondary | ICD-10-CM | POA: Insufficient documentation

## 2022-07-30 DIAGNOSIS — Z452 Encounter for adjustment and management of vascular access device: Secondary | ICD-10-CM | POA: Diagnosis not present

## 2022-07-30 HISTORY — PX: IR IMAGING GUIDED PORT INSERTION: IMG5740

## 2022-07-30 MED ORDER — FENTANYL CITRATE (PF) 100 MCG/2ML IJ SOLN
INTRAMUSCULAR | Status: AC | PRN
  Administered 2022-07-30 (×2): 25 ug via INTRAVENOUS

## 2022-07-30 MED ORDER — HEPARIN SOD (PORK) LOCK FLUSH 100 UNIT/ML IV SOLN
INTRAVENOUS | Status: AC
  Administered 2022-07-30: 500 [IU]
  Filled 2022-07-30: qty 5

## 2022-07-30 MED ORDER — MIDAZOLAM HCL 2 MG/2ML IJ SOLN
INTRAMUSCULAR | Status: AC
  Filled 2022-07-30: qty 4

## 2022-07-30 MED ORDER — LIDOCAINE-EPINEPHRINE 1 %-1:100000 IJ SOLN
INTRAMUSCULAR | Status: AC
  Administered 2022-07-30: 15 mL via INTRADERMAL
  Filled 2022-07-30: qty 1

## 2022-07-30 MED ORDER — SODIUM CHLORIDE 0.9 % IV SOLN: INTRAVENOUS | Status: DC

## 2022-07-30 MED ORDER — MIDAZOLAM HCL 2 MG/2ML IJ SOLN
INTRAMUSCULAR | Status: AC | PRN
  Administered 2022-07-30: .5 mg via INTRAVENOUS
  Administered 2022-07-30: 1 mg via INTRAVENOUS

## 2022-07-30 MED ORDER — FENTANYL CITRATE (PF) 100 MCG/2ML IJ SOLN
INTRAMUSCULAR | Status: AC
  Filled 2022-07-30: qty 2

## 2022-07-30 NOTE — Procedures (Signed)
Interventional Radiology Procedure Note  Procedure: Placement of a right IJ approach single lumen PowerPort.  Tip is positioned at the superior cavoatrial junction and catheter is ready for immediate use.  Complications: No immediate Recommendations:  - Ok to shower tomorrow - Do not submerge for 7 days - Routine line care   Signed,  Nedra Mcinnis K. Bravlio Luca, MD   

## 2022-07-30 NOTE — H&P (Signed)
Chief Complaint: Chemotherapy access. Request is for portacath placement.   Referring Physician(s): Katragadda,Sreedhar  Supervising Physician: Jacqulynn Cadet  Patient Status: [facility] - Out-pt  History of Present Illness: [name] is a 72 y.o. female  outpatient. History of GERD, CHF, CML. Team is requesting a portacath placement for chemotherapy access.   Currently without any significant complaints. Patient alert and laying in bed,calm. Denies any fevers, headache, chest pain, SOB, cough, abdominal pain, nausea, vomiting or bleeding. Return precautions and treatment recommendations and follow-up discussed with the patient  who is agreeable with the plan.    Past Medical History:  Diagnosis Date   Anxiety    Asthma    had 1 episode 1 year ago-no more problem   CHF (congestive heart failure) (HCC)    swelling of feet & ankles   CMML (chronic myelomonocytic leukemia) (HCC) 06/03/2022   Cough    Depression    OCD   GERD (gastroesophageal reflux disease)    severe   OCD (obsessive compulsive disorder)    Port-A-Cath in place 07/26/2022   Shortness of breath    with exertion   Yeast infection 04/09/2014    Past Surgical History:  Procedure Laterality Date   CARPAL TUNNEL RELEASE  03/22/2012   Procedure: CARPAL TUNNEL RELEASE;  Surgeon: Wynonia Sours, MD;  Location: [place];  Service: Orthopedics;  Laterality: Right;  right carpal tunnel release   COLONOSCOPY  06/19/2012   Dr. Rourk:normal rectum and colon    ESOPHAGOGASTRODUODENOSCOPY N/A 12/26/2015   Dr. Gala Romney: normal    TONSILLECTOMY     TUBAL LIGATION      Allergies: Levonorgestrel-ethinyl estrad, Sulfa antibiotics, Sulfamethoxazole-trimethoprim, and Cephalosporins  Medications: Prior to Admission medications   Medication Sig Start Date End Date Taking? Authorizing Provider  albuterol (VENTOLIN HFA) 108 (90 Base) MCG/ACT inhaler INHALE 2 PUFFS BY MOUTH EVERY 6 HOURS AS NEEDED FOR SHORTNESS  OF BREATH OR WHEEZING. 04/25/20   Kennith Gain, MD  allopurinol (ZYLOPRIM) 300 MG tablet Take 1 tablet (300 mg total) by mouth daily. 07/26/22   Derek Jack, MD  azaCITIDine 5 mg/2 mLs in lactated ringers infusion Inject into the vein daily. Days 1-7 every 28 days 08/02/22   [provider]  azelastine (ASTELIN) 0.1 % nasal spray SPRAY 1 TO 2 SPRAYS PER NOSTRIL TWICE DAILY. 08/10/19   Bobbitt, Sedalia Muta, MD  BREZTRI AEROSPHERE 160-9-4.8 MCG/ACT AERO INHALE 2 PUFFS BY MOUTH TWICE DAILY 10/02/21   Kennith Gain, MD  celecoxib (CELEBREX) 100 MG capsule Take 2 capsules (200 mg total) by mouth 2 (two) times daily. 04/17/22   Milton Ferguson, MD  cetirizine (ZYRTEC) 5 MG tablet Take 1 tablet (5 mg total) by mouth daily. 10/31/21   Jaynee Eagles, PA-C  cimetidine (TAGAMET) 800 MG tablet TAKE 1/2 TABLET BY MOUTH ONCE OR TWICE DAILY AS NEEDED 04/13/19   Bobbitt, Sedalia Muta, MD  dexlansoprazole (DEXILANT) 60 MG capsule Take 1 capsule (60 mg total) by mouth daily. 05/07/20   Kennith Gain, MD  doxycycline (VIBRAMYCIN) 100 MG capsule Take 100 mg by mouth 2 (two) times daily. 07/09/22   [provider]  EPINEPHrine 0.3 mg/0.3 mL IJ SOAJ injection Inject 0.3 mg into the muscle as needed for anaphylaxis. 08/07/20   Valentina Shaggy, MD  famotidine (PEPCID) 20 MG tablet Take 20 mg by mouth 2 (two) times daily. 06/03/22   [provider]  FLUoxetine (PROZAC) 20 MG capsule Take 1 capsule by mouth daily.  [provider]  fluticasone (FLONASE) 50 MCG/ACT nasal spray Place 2 sprays into both nostrils 2 (two) times daily. 12/17/14   [name] Stain, MD  Fluticasone-Umeclidin-Vilant (TRELEGY ELLIPTA) 200-62.5-25 MCG/ACT AEPB Inhale 1 puff into the lungs daily. 02/24/22   Kennith Gain, MD  folic acid (FOLVITE) 1 MG tablet Take 1 tablet (1 mg total) by mouth daily. 05/03/22   Derek Jack, MD  gabapentin (NEURONTIN) 300 MG  capsule  08/10/18   [provider]  HYDROcodone bit-homatropine (HYCODAN) 5-1.5 MG/5ML syrup TAKE ONE TEASPOONFUL (5 ml) BY MOUTH EVERY 4 HOURS 07/14/22   [provider]  ipratropium (ATROVENT) 0.06 % nasal spray USE 2 SPRAYS IN EACH NOSTRIL THREE TIMES A DAY AS NEEDED. 10/02/21   Kennith Gain, MD  Lactulose 20 GM/30ML SOLN Take 30 mLs (20 g total) by mouth daily as needed. Take 30 ml by mouth every 3 hours until bowel movement then take daily as needed for constipation 07/29/22   Derek Jack, MD  lidocaine-prilocaine (EMLA) cream Apply a quarter sized amount to port a cath site and cover with plastic wrap one hour prior to infusion appointments 07/29/22   Derek Jack, MD  megestrol (MEGACE) 400 MG/10ML suspension Take 10 mLs (400 mg total) by mouth 2 (two) times daily. 07/13/22   Derek Jack, MD  montelukast (SINGULAIR) 10 MG tablet TAKE ONE TABLET BY MOUTH AT BEDTIME. 07/29/20   Padgett, Rae Halsted, MD  naproxen (NAPROSYN) 500 MG tablet Take 500 mg by mouth 2 (two) times daily. 04/19/22   [provider]  [facility]  Antifungal (nail)-#1    [provider]  NON FORMULARY [facility]  Anti-fungal (nail)-#1    [provider]  omeprazole (PRILOSEC) 20 MG capsule Take 1 capsule (20 mg total) by mouth daily. 01/11/20   Kennith Gain, MD  oxyCODONE-acetaminophen (PERCOCET/ROXICET) 5-325 MG tablet Take 1 tablet by mouth every 6 (six) hours as needed for severe pain. 04/17/22   Milton Ferguson, MD  pantoprazole (PROTONIX) 40 MG tablet Take 40 mg by mouth daily. 06/03/22   [provider]  prochlorperazine (COMPAZINE) 10 MG tablet Take 1 tablet (10 mg total) by mouth every 6 (six) hours as needed for nausea or vomiting. 07/13/22   Derek Jack, MD  prochlorperazine (COMPAZINE) 10 MG tablet Take 1 tablet (10 mg total) by mouth every 6 (six) hours as needed for  nausea or vomiting. 07/29/22   Derek Jack, MD  Respiratory Therapy Supplies (FLUTTER) DEVI Use as directed 10/04/17   Bobbitt, Sedalia Muta, MD  SF 5000 PLUS 1.1 % CREA dental cream  01/29/19   [provider]  Spacer/Aero-Holding Chambers (AEROCHAMBER PLUS WITH MASK) inhaler 1 each by Other route See admin instructions. Use with inhaler as instructed. 02/20/20   Kennith Gain, MD  traZODone (DESYREL) 50 MG tablet Take 1 tablet (50 mg total) by mouth at bedtime. 07/28/22   Derek Jack, MD  triamcinolone cream (KENALOG) 0.1 %  03/11/20   [provider]     Family History  Problem Relation Age of Onset   Asthma Mother    Macular degeneration Mother    Hypertension Mother    Alzheimer's disease Father    Other Sister        blood clots   Other Son        enlarged spleen   Diabetes Maternal Grandfather    Diabetes Paternal Grandfather    Colon cancer Neg Hx  Social History   Socioeconomic History   Marital status: Widowed    Spouse name: Not on file   Number of children: 1   Years of education: Not on file   Highest education level: Not on file  Occupational History   Occupation: retired  Tobacco Use   Smoking status: Never    Passive exposure: Yes   Smokeless tobacco: Never  Vaping Use   Vaping Use: Never used  Substance and Sexual Activity   Alcohol use: No   Drug use: No   Sexual activity: Yes    Birth control/protection: Surgical    Comment: tubal  Other Topics Concern   Not on file  Social History Narrative   Not on file   Social Determinants of Health   Financial Resource Strain: Not on file  Food Insecurity: Not on file  Transportation Needs: Not on file  Physical Activity: Not on file  Stress: Not on file  Social Connections: Not on file    Review of Systems: A 12 point ROS discussed and pertinent positives are indicated in the HPI above.  All other systems are negative.  Review of Systems   Constitutional:  Negative for fatigue and fever.  HENT:  Negative for congestion.   Respiratory:  Negative for cough and shortness of breath.   Gastrointestinal:  Negative for abdominal pain, diarrhea, nausea and vomiting.    Vital Signs: BP 124/75   Pulse 64   Temp 98.6 F (37 C) (Oral)   Resp 18   Ht 5' (1.524 m)   Wt 140 lb (63.5 kg)   SpO2 98%   BMI 27.34 kg/m     Physical Exam Vitals and nursing note reviewed.  Constitutional:      Appearance: She is well-developed.  HENT:     Head: Normocephalic and atraumatic.  Eyes:     Conjunctiva/sclera: Conjunctivae normal.  Cardiovascular:     Rate and Rhythm: Normal rate and regular rhythm.     Heart sounds: Normal heart sounds.  Pulmonary:     Effort: Pulmonary effort is normal.     Breath sounds: Normal breath sounds.  Musculoskeletal:        General: Normal range of motion.     Cervical back: Normal range of motion.  Skin:    General: Skin is warm.  Neurological:     Mental Status: She is alert and oriented to person, place, and time.     Imaging: NM PET Image Initial (PI) Skull Base To Thigh  Result Date: 07/13/2022 CLINICAL DATA:  Initial treatment strategy for splenomegaly. Thrombocytopenia. Anemia EXAM: NUCLEAR MEDICINE PET SKULL BASE TO THIGH TECHNIQUE: 7.3 mCi F-18 FDG was injected intravenously. Full-ring PET imaging was performed from the skull base to thigh after the radiotracer. CT data was obtained and used for attenuation correction and anatomic localization. Fasting blood glucose: 100 mg/dl COMPARISON:  CT 06/21/2022 FINDINGS: Mediastinal blood pool activity: SUV max 2.1 Liver activity: SUV max 2.7 NECK: No hypermetabolic lymph nodes in the neck. Incidental CT findings: None. CHEST: Choose one The previous described prominent LEFT axillary lymph nodes do not have hypermetabolic activity. No hypermetabolic mediastinal nodes. Incidental CT findings: None. ABDOMEN/PELVIS: Spleen is enlarged measuring 12.8 by  12.7 x 6.0 cm (volume = 510 cm^3). Normal metabolic activity within the enlarged spleen. No hypermetabolic abdominopelvic lymph nodes.  Normal liver. Incidental CT findings: None. SKELETON: There is uniform increase in marrow activity within the axillary and appendicular skeleton. No focal lesion present. No suspicious lesion  on the CT portion exam. Incidental CT findings: None. IMPRESSION: 1. Mild splenomegaly with normal metabolic activity. 2. Uniform increase in marrow metabolic presumably relates to patient's anemia. Cannot exclude underlying leukemia/bone marrow malignancy. No focal activity present. 3. No lymphadenopathy. Electronically Signed   By: Suzy Bouchard M.D.   On: 07/13/2022 08:44    Labs:  CBC: Recent Labs    04/26/22 0922 05/25/22 0800 07/08/22 0819 07/13/22 0907  WBC 10.0 9.4 7.2 8.4  HGB 9.1* 8.9* 9.3* 9.2*  HCT 29.9* 29.3* 31.0* 30.3*  PLT 146* 118* 119* 41*    COAGS: No results for input(s): "INR", "APTT" in the last 8760 hours.  BMP: Recent Labs    04/17/22 0933 04/26/22 0922  NA 135 141  K 4.1 3.9  CL 103 107  CO2 29 28  GLUCOSE 118* 97  BUN 12 16  CALCIUM 9.3 9.4  CREATININE 1.03* 1.03*  GFRNONAA 58* 58*    LIVER FUNCTION TESTS: Recent Labs    04/26/22 0922  BILITOT 1.0  AST 32  ALT 20  ALKPHOS 57  PROT 6.6  ALBUMIN 4.0     Assessment and Plan:  72 y.o. female outpatient. History of GERD, CHF, CML. Team is requesting a portacath placement for chemotherapy access.   IR previously performed a bone marrow biopsy. 12.25.23 labs show PLT 41.  Allergies include Sulfa, Cephalosporins, bactrim. Patient has been NPO since midnight.  Risks and benefits of image guided port-a-catheter placement was discussed with the patient including, but not limited to bleeding, infection, pneumothorax, or fibrin sheath development and need for additional procedures.  All of the patient's questions were answered, patient is agreeable to proceed. Consent  signed and in chart.   Thank you for this interesting consult.  I greatly enjoyed meeting [name] and look forward to participating in their care.  A copy of this report was sent to the requesting provider on this date.  Electronically Signed: Jacqualine Mau, NP 07/30/2022, 12:35 PM   I spent a total of  30 Minutes   in face to face in clinical consultation, greater than 50% of which was counseling/coordinating care for

## 2022-08-02 ENCOUNTER — Inpatient Hospital Stay: Payer: Medicare HMO

## 2022-08-02 VITALS — BP 115/92 | HR 83 | Temp 98.0°F | Resp 18 | Wt 139.8 lb

## 2022-08-02 DIAGNOSIS — R053 Chronic cough: Secondary | ICD-10-CM | POA: Diagnosis not present

## 2022-08-02 DIAGNOSIS — K5900 Constipation, unspecified: Secondary | ICD-10-CM | POA: Diagnosis not present

## 2022-08-02 DIAGNOSIS — C9310 Chronic myelomonocytic leukemia not having achieved remission: Secondary | ICD-10-CM | POA: Diagnosis not present

## 2022-08-02 DIAGNOSIS — R0602 Shortness of breath: Secondary | ICD-10-CM | POA: Diagnosis not present

## 2022-08-02 DIAGNOSIS — Z95828 Presence of other vascular implants and grafts: Secondary | ICD-10-CM

## 2022-08-02 DIAGNOSIS — G479 Sleep disorder, unspecified: Secondary | ICD-10-CM | POA: Diagnosis not present

## 2022-08-02 DIAGNOSIS — K219 Gastro-esophageal reflux disease without esophagitis: Secondary | ICD-10-CM | POA: Diagnosis not present

## 2022-08-02 DIAGNOSIS — Z5111 Encounter for antineoplastic chemotherapy: Secondary | ICD-10-CM | POA: Diagnosis not present

## 2022-08-02 DIAGNOSIS — J45909 Unspecified asthma, uncomplicated: Secondary | ICD-10-CM | POA: Diagnosis not present

## 2022-08-02 DIAGNOSIS — D696 Thrombocytopenia, unspecified: Secondary | ICD-10-CM | POA: Diagnosis not present

## 2022-08-02 LAB — CBC WITH DIFFERENTIAL/PLATELET
Abs Immature Granulocytes: 0.6 10*3/uL — ABNORMAL HIGH (ref 0.00–0.07)
Band Neutrophils: 3 %
Basophils Absolute: 0 10*3/uL (ref 0.0–0.1)
Basophils Relative: 0 %
Eosinophils Absolute: 0 10*3/uL (ref 0.0–0.5)
Eosinophils Relative: 0 %
HCT: 30.8 % — ABNORMAL LOW (ref 36.0–46.0)
Hemoglobin: 9.4 g/dL — ABNORMAL LOW (ref 12.0–15.0)
Lymphocytes Relative: 20 %
Lymphs Abs: 1.4 10*3/uL (ref 0.7–4.0)
MCH: 33 pg (ref 26.0–34.0)
MCHC: 30.5 g/dL (ref 30.0–36.0)
MCV: 108.1 fL — ABNORMAL HIGH (ref 80.0–100.0)
Metamyelocytes Relative: 3 %
Monocytes Absolute: 1.5 10*3/uL — ABNORMAL HIGH (ref 0.1–1.0)
Monocytes Relative: 21 %
Myelocytes: 4 %
Neutro Abs: 3.6 10*3/uL (ref 1.7–7.7)
Neutrophils Relative %: 48 %
Platelets: 153 10*3/uL (ref 150–400)
Promyelocytes Relative: 1 %
RBC: 2.85 MIL/uL — ABNORMAL LOW (ref 3.87–5.11)
RDW: 16.9 % — ABNORMAL HIGH (ref 11.5–15.5)
WBC: 7 10*3/uL (ref 4.0–10.5)
nRBC: 0 % (ref 0.0–0.2)

## 2022-08-02 LAB — COMPREHENSIVE METABOLIC PANEL
ALT: 12 U/L (ref 0–44)
AST: 19 U/L (ref 15–41)
Albumin: 4.1 g/dL (ref 3.5–5.0)
Alkaline Phosphatase: 46 U/L (ref 38–126)
Anion gap: 6 (ref 5–15)
BUN: 22 mg/dL (ref 8–23)
CO2: 24 mmol/L (ref 22–32)
Calcium: 9 mg/dL (ref 8.9–10.3)
Chloride: 105 mmol/L (ref 98–111)
Creatinine, Ser: 1.16 mg/dL — ABNORMAL HIGH (ref 0.44–1.00)
GFR, Estimated: 50 mL/min — ABNORMAL LOW (ref >60)
Glucose, Bld: 101 mg/dL — ABNORMAL HIGH (ref 70–99)
Potassium: 4.1 mmol/L (ref 3.5–5.1)
Sodium: 135 mmol/L (ref 135–145)
Total Bilirubin: 0.5 mg/dL (ref 0.3–1.2)
Total Protein: 7.2 g/dL (ref 6.5–8.1)

## 2022-08-02 LAB — MAGNESIUM: Magnesium: 2.2 mg/dL (ref 1.7–2.4)

## 2022-08-02 LAB — URIC ACID: Uric Acid, Serum: 5 mg/dL (ref 2.5–7.1)

## 2022-08-02 MED ORDER — PALONOSETRON HCL INJECTION 0.25 MG/5ML
0.2500 mg | Freq: Once | INTRAVENOUS | Status: AC
  Administered 2022-08-02: 0.25 mg via INTRAVENOUS
  Filled 2022-08-02: qty 5

## 2022-08-02 MED ORDER — HEPARIN SOD (PORK) LOCK FLUSH 100 UNIT/ML IV SOLN
500.0000 [IU] | Freq: Once | INTRAVENOUS | Status: AC
  Administered 2022-08-02: 500 [IU] via INTRAVENOUS

## 2022-08-02 MED ORDER — SODIUM CHLORIDE 0.9 % IV SOLN: INTRAVENOUS | Status: DC

## 2022-08-02 MED ORDER — SODIUM CHLORIDE 0.9% FLUSH
10.0000 mL | INTRAVENOUS | Status: AC
  Administered 2022-08-02: 10 mL

## 2022-08-02 MED ORDER — SODIUM CHLORIDE 0.9 % IV SOLN
75.0000 mg/m2 | Freq: Once | INTRAVENOUS | Status: AC
  Administered 2022-08-02: 125 mg via INTRAVENOUS
  Filled 2022-08-02: qty 12.5

## 2022-08-02 MED ORDER — SODIUM CHLORIDE 0.9% FLUSH
10.0000 mL | INTRAVENOUS | Status: DC | PRN
  Administered 2022-08-02: 10 mL via INTRAVENOUS

## 2022-08-02 NOTE — Patient Instructions (Signed)
[facility]  Discharge Instructions: Thank you for choosing [facility] to provide your oncology and hematology care.  If you have a lab appointment with the Cancer Center, please come in thru the Main Entrance and check in at the main information desk.  Wear comfortable clothing and clothing appropriate for easy access to any Portacath or PICC line.   We strive to give you quality time with your provider. You may need to reschedule your appointment if you arrive late (15 or more minutes).  Arriving late affects you and other patients whose appointments are after yours.  Also, if you miss three or more appointments without notifying the office, you may be dismissed from the clinic at the provider's discretion.      For prescription refill requests, have your pharmacy contact our office and allow 72 hours for refills to be completed.    Today you received the following chemotherapy and/or immunotherapy agents Vidaza. Azacitidine Injection What is this medication? AZACITIDINE (ay za SITE i deen) treats blood and bone marrow cancers. It works by slowing down the growth of cancer cells. This medicine may be used for other purposes; ask your health care provider or pharmacist if you have questions. COMMON BRAND NAME(S): Vidaza What should I tell my care team before I take this medication? They need to know if you have any of these conditions: Kidney disease Liver disease Low blood cell levels, such as low white cells, platelets, or red blood cells Low levels of albumin in the blood Low levels of bicarbonate in the blood An unusual or allergic reaction to azacitidine, mannitol, other medications, foods, dyes, or preservatives If you or your partner are pregnant or trying to get pregnant Breast-feeding How should I use this medication? This medication is injected into a vein or under the skin. It is given by your care team in a hospital or clinic setting. Talk  to your care team about the use of this medication in children. While it may be prescribed for children as young as 1 month for selected conditions, precautions do apply. Overdosage: If you think you have taken too much of this medicine contact a poison control center or emergency room at once. NOTE: This medicine is only for you. Do not share this medicine with others. What if I miss a dose? Keep appointments for follow-up doses. It is important not to miss your dose. Call your care team if you are unable to keep an appointment. What may interact with this medication? Interactions are not expected. This list may not describe all possible interactions. Give your health care provider a list of all the medicines, herbs, non-prescription drugs, or dietary supplements you use. Also tell them if you smoke, drink alcohol, or use illegal drugs. Some items may interact with your medicine. What should I watch for while using this medication? Your condition will be monitored carefully while you are receiving this medication. You may need blood work while taking this medication. This medication may make you feel generally unwell. This is not uncommon as chemotherapy can affect healthy cells as well as cancer cells. Report any side effects. Continue your course of treatment even though you feel ill unless your care team tells you to stop. Other product types may be available that contain the medication azacitidine. The injection and oral products should not be used in place of one another. Talk to your care team if you have questions. This medication can cause serious side effects.   To reduce the risk, your care team may give you other medications to take before receiving this one. Be sure to follow the directions from your care team. This medication may increase your risk of getting an infection. Call your care team for advice if you get a fever, chills, sore throat, or other symptoms of a cold or flu. Do not treat  yourself. Try to avoid being around people who are sick. Avoid taking medications that contain aspirin, acetaminophen, ibuprofen, naproxen, or ketoprofen unless instructed by your care team. These medications may hide a fever. This medication may increase your risk to bruise or bleed. Call your care team if you notice any unusual bleeding. Be careful brushing or flossing your teeth or using a toothpick because you may get an infection or bleed more easily. If you have any dental work done, tell your dentist you are receiving this medication. Talk to your care team if you or your partner may be pregnant. Serious birth defects can occur if you take this medication during pregnancy and for 6 months after the last dose. You will need a negative pregnancy test before starting this medication. Contraception is recommended while taking his medication and for 6 months after the last dose. Your care team can help you find the option that works for you. If your partner can get pregnant, use a condom during sex while taking this medication and for 3 months after the last dose. Do not breastfeed while taking this medication and for 1 week after the last dose. This medication may cause infertility. Talk to your care team if you are concerned about your fertility. What side effects may I notice from receiving this medication? Side effects that you should report to your care team as soon as possible: Allergic reactions--skin rash, itching, hives, swelling of the face, lips, tongue, or throat Infection--fever, chills, cough, sore throat, wounds that don't heal, pain or trouble when passing urine, general feeling of discomfort or being unwell Kidney injury--decrease in the amount of urine, swelling of the ankles, hands, or feet Liver injury--right upper belly pain, loss of appetite, nausea, light-colored stool, dark yellow or brown urine, yellowing skin or eyes, unusual weakness or fatigue Low red blood cell  level--unusual weakness or fatigue, dizziness, headache, trouble breathing Tumor lysis syndrome (TLS)--nausea, vomiting, diarrhea, decrease in the amount of urine, dark urine, unusual weakness or fatigue, confusion, muscle pain or cramps, fast or irregular heartbeat, joint pain Unusual bruising or bleeding Side effects that usually do not require medical attention (report to your care team if they continue or are bothersome): Constipation Diarrhea Nausea Pain, redness, or irritation at injection site Vomiting This list may not describe all possible side effects. Call your doctor for medical advice about side effects. You may report side effects to FDA at 1-800-FDA-1088. Where should I keep my medication? This medication is given in a hospital or clinic. It will not be stored at home. NOTE: This sheet is a summary. It may not cover all possible information. If you have questions about this medicine, talk to your doctor, pharmacist, or health care provider.  2023 Elsevier/Gold Standard (2021-11-19 00:00:00)       To help prevent nausea and vomiting after your treatment, we encourage you to take your nausea medication as directed.  BELOW ARE SYMPTOMS THAT SHOULD BE REPORTED IMMEDIATELY: *FEVER GREATER THAN 100.4 F (38 C) OR HIGHER *CHILLS OR SWEATING *NAUSEA AND VOMITING THAT IS NOT CONTROLLED WITH YOUR NAUSEA MEDICATION *UNUSUAL SHORTNESS OF BREATH *UNUSUAL   BRUISING OR BLEEDING *URINARY PROBLEMS (pain or burning when urinating, or frequent urination) *BOWEL PROBLEMS (unusual diarrhea, constipation, pain near the anus) TENDERNESS IN MOUTH AND THROAT WITH OR WITHOUT PRESENCE OF ULCERS (sore throat, sores in mouth, or a toothache) UNUSUAL RASH, SWELLING OR PAIN  UNUSUAL VAGINAL DISCHARGE OR ITCHING   Items with * indicate a potential emergency and should be followed up as soon as possible or go to the Emergency Department if any problems should occur.  Please show the CHEMOTHERAPY ALERT  CARD or IMMUNOTHERAPY ALERT CARD at check-in to the Emergency Department and triage nurse.  Should you have questions after your visit or need to cancel or reschedule your appointment, please contact [facility] [phone]  and follow the prompts.  Office hours are 8:00 a.m. to 4:30 p.m. Monday - Friday. Please note that voicemails left after 4:00 p.m. may not be returned until the following business day.  We are closed weekends and major holidays. You have access to a nurse at all times for urgent questions. Please call the main number to the clinic [phone] and follow the prompts.  For any non-urgent questions, you may also contact your provider using MyChart. We now offer e-Visits for anyone 18 and older to request care online for non-urgent symptoms. For details visit [email or web address].   Also download the MyChart app! Go to the app store, search "MyChart", open the app, select [place] [facility], and log in with your MyChart username and password.   

## 2022-08-02 NOTE — Progress Notes (Signed)
Patients port flushed without difficulty.  Good blood return noted with no bruising or swelling noted at site.  Patient remains accessed for Vidaza infusion.

## 2022-08-02 NOTE — Progress Notes (Signed)
Pharmacist Chemotherapy Monitoring - Initial Assessment    Anticipated start date: 08/02/22   The following has been reviewed per standard work regarding the patient's treatment regimen: The patient's diagnosis, treatment plan and drug doses, and organ/hematologic function Lab orders and baseline tests specific to treatment regimen  The treatment plan start date, drug sequencing, and pre-medications Prior authorization status  Patient's documented medication list, including drug-drug interaction screen and prescriptions for anti-emetics and supportive care specific to the treatment regimen The drug concentrations, fluid compatibility, administration routes, and timing of the medications to be used The patient's access for treatment and lifetime cumulative dose history, if applicable  The patient's medication allergies and previous infusion related reactions, if applicable   Changes made to treatment plan:  administration route _ MD wanted IVPB and entered Subq orders - received message to change route to IVPB  Follow up needed:  N/A   Wynona Neat, [facility] 08/02/2022  2:11 PM

## 2022-08-02 NOTE — Progress Notes (Signed)
Patient presents today for C1D1 Vidaza. Vital signs and labs within parameters for treatment. Teaching and consent performed on 07-29-2022 by A. Beckie Salts.   Treatment given today per MD orders. Tolerated infusion without adverse affects. Vital signs stable. No complaints at this time. Discharged from clinic ambulatory in stable condition. Alert and oriented x 3. F/U with [facility] as scheduled.

## 2022-08-03 ENCOUNTER — Inpatient Hospital Stay: Payer: Medicare HMO

## 2022-08-03 VITALS — BP 84/69 | HR 68 | Temp 98.4°F | Resp 18

## 2022-08-03 DIAGNOSIS — J45909 Unspecified asthma, uncomplicated: Secondary | ICD-10-CM | POA: Diagnosis not present

## 2022-08-03 DIAGNOSIS — R053 Chronic cough: Secondary | ICD-10-CM | POA: Diagnosis not present

## 2022-08-03 DIAGNOSIS — G479 Sleep disorder, unspecified: Secondary | ICD-10-CM | POA: Diagnosis not present

## 2022-08-03 DIAGNOSIS — D696 Thrombocytopenia, unspecified: Secondary | ICD-10-CM | POA: Diagnosis not present

## 2022-08-03 DIAGNOSIS — K5900 Constipation, unspecified: Secondary | ICD-10-CM | POA: Diagnosis not present

## 2022-08-03 DIAGNOSIS — Z95828 Presence of other vascular implants and grafts: Secondary | ICD-10-CM

## 2022-08-03 DIAGNOSIS — R0602 Shortness of breath: Secondary | ICD-10-CM | POA: Diagnosis not present

## 2022-08-03 DIAGNOSIS — C9310 Chronic myelomonocytic leukemia not having achieved remission: Secondary | ICD-10-CM | POA: Diagnosis not present

## 2022-08-03 DIAGNOSIS — Z5111 Encounter for antineoplastic chemotherapy: Secondary | ICD-10-CM | POA: Diagnosis not present

## 2022-08-03 DIAGNOSIS — K219 Gastro-esophageal reflux disease without esophagitis: Secondary | ICD-10-CM | POA: Diagnosis not present

## 2022-08-03 MED ORDER — HEPARIN SOD (PORK) LOCK FLUSH 100 UNIT/ML IV SOLN
500.0000 [IU] | Freq: Once | INTRAVENOUS | Status: AC
  Administered 2022-08-03: 500 [IU] via INTRAVENOUS

## 2022-08-03 MED ORDER — SODIUM CHLORIDE 0.9% FLUSH
10.0000 mL | Freq: Once | INTRAVENOUS | Status: AC
  Administered 2022-08-03: 10 mL via INTRAVENOUS

## 2022-08-03 MED ORDER — SODIUM CHLORIDE 0.9 % IV SOLN
75.0000 mg/m2 | Freq: Once | INTRAVENOUS | Status: AC
  Administered 2022-08-03: 125 mg via INTRAVENOUS
  Filled 2022-08-03: qty 12.5

## 2022-08-03 MED ORDER — SODIUM CHLORIDE 0.9% FLUSH
10.0000 mL | Freq: Once | INTRAVENOUS | Status: AC
  Administered 2022-08-03: 10 mL

## 2022-08-03 MED ORDER — SODIUM CHLORIDE 0.9 % IV SOLN: Freq: Once | INTRAVENOUS | Status: AC

## 2022-08-03 NOTE — Patient Instructions (Signed)
[facility]  Discharge Instructions: Thank you for choosing [facility] to provide your oncology and hematology care.  If you have a lab appointment with the Cancer Center, please come in thru the Main Entrance and check in at the main information desk.  Wear comfortable clothing and clothing appropriate for easy access to any Portacath or PICC line.   We strive to give you quality time with your provider. You may need to reschedule your appointment if you arrive late (15 or more minutes).  Arriving late affects you and other patients whose appointments are after yours.  Also, if you miss three or more appointments without notifying the office, you may be dismissed from the clinic at the provider's discretion.      For prescription refill requests, have your pharmacy contact our office and allow 72 hours for refills to be completed.    Today you received the following chemotherapy and/or immunotherapy agents Vidaza. Azacitidine Injection What is this medication? AZACITIDINE (ay za SITE i deen) treats blood and bone marrow cancers. It works by slowing down the growth of cancer cells. This medicine may be used for other purposes; ask your health care provider or pharmacist if you have questions. COMMON BRAND NAME(S): Vidaza What should I tell my care team before I take this medication? They need to know if you have any of these conditions: Kidney disease Liver disease Low blood cell levels, such as low white cells, platelets, or red blood cells Low levels of albumin in the blood Low levels of bicarbonate in the blood An unusual or allergic reaction to azacitidine, mannitol, other medications, foods, dyes, or preservatives If you or your partner are pregnant or trying to get pregnant Breast-feeding How should I use this medication? This medication is injected into a vein or under the skin. It is given by your care team in a hospital or clinic setting. Talk  to your care team about the use of this medication in children. While it may be prescribed for children as young as 1 month for selected conditions, precautions do apply. Overdosage: If you think you have taken too much of this medicine contact a poison control center or emergency room at once. NOTE: This medicine is only for you. Do not share this medicine with others. What if I miss a dose? Keep appointments for follow-up doses. It is important not to miss your dose. Call your care team if you are unable to keep an appointment. What may interact with this medication? Interactions are not expected. This list may not describe all possible interactions. Give your health care provider a list of all the medicines, herbs, non-prescription drugs, or dietary supplements you use. Also tell them if you smoke, drink alcohol, or use illegal drugs. Some items may interact with your medicine. What should I watch for while using this medication? Your condition will be monitored carefully while you are receiving this medication. You may need blood work while taking this medication. This medication may make you feel generally unwell. This is not uncommon as chemotherapy can affect healthy cells as well as cancer cells. Report any side effects. Continue your course of treatment even though you feel ill unless your care team tells you to stop. Other product types may be available that contain the medication azacitidine. The injection and oral products should not be used in place of one another. Talk to your care team if you have questions. This medication can cause serious side effects.   To reduce the risk, your care team may give you other medications to take before receiving this one. Be sure to follow the directions from your care team. This medication may increase your risk of getting an infection. Call your care team for advice if you get a fever, chills, sore throat, or other symptoms of a cold or flu. Do not treat  yourself. Try to avoid being around people who are sick. Avoid taking medications that contain aspirin, acetaminophen, ibuprofen, naproxen, or ketoprofen unless instructed by your care team. These medications may hide a fever. This medication may increase your risk to bruise or bleed. Call your care team if you notice any unusual bleeding. Be careful brushing or flossing your teeth or using a toothpick because you may get an infection or bleed more easily. If you have any dental work done, tell your dentist you are receiving this medication. Talk to your care team if you or your partner may be pregnant. Serious birth defects can occur if you take this medication during pregnancy and for 6 months after the last dose. You will need a negative pregnancy test before starting this medication. Contraception is recommended while taking his medication and for 6 months after the last dose. Your care team can help you find the option that works for you. If your partner can get pregnant, use a condom during sex while taking this medication and for 3 months after the last dose. Do not breastfeed while taking this medication and for 1 week after the last dose. This medication may cause infertility. Talk to your care team if you are concerned about your fertility. What side effects may I notice from receiving this medication? Side effects that you should report to your care team as soon as possible: Allergic reactions--skin rash, itching, hives, swelling of the face, lips, tongue, or throat Infection--fever, chills, cough, sore throat, wounds that don't heal, pain or trouble when passing urine, general feeling of discomfort or being unwell Kidney injury--decrease in the amount of urine, swelling of the ankles, hands, or feet Liver injury--right upper belly pain, loss of appetite, nausea, light-colored stool, dark yellow or brown urine, yellowing skin or eyes, unusual weakness or fatigue Low red blood cell  level--unusual weakness or fatigue, dizziness, headache, trouble breathing Tumor lysis syndrome (TLS)--nausea, vomiting, diarrhea, decrease in the amount of urine, dark urine, unusual weakness or fatigue, confusion, muscle pain or cramps, fast or irregular heartbeat, joint pain Unusual bruising or bleeding Side effects that usually do not require medical attention (report to your care team if they continue or are bothersome): Constipation Diarrhea Nausea Pain, redness, or irritation at injection site Vomiting This list may not describe all possible side effects. Call your doctor for medical advice about side effects. You may report side effects to FDA at 1-800-FDA-1088. Where should I keep my medication? This medication is given in a hospital or clinic. It will not be stored at home. NOTE: This sheet is a summary. It may not cover all possible information. If you have questions about this medicine, talk to your doctor, pharmacist, or health care provider.  2023 Elsevier/Gold Standard (2021-11-19 00:00:00)       To help prevent nausea and vomiting after your treatment, we encourage you to take your nausea medication as directed.  BELOW ARE SYMPTOMS THAT SHOULD BE REPORTED IMMEDIATELY: *FEVER GREATER THAN 100.4 F (38 C) OR HIGHER *CHILLS OR SWEATING *NAUSEA AND VOMITING THAT IS NOT CONTROLLED WITH YOUR NAUSEA MEDICATION *UNUSUAL SHORTNESS OF BREATH *UNUSUAL   BRUISING OR BLEEDING *URINARY PROBLEMS (pain or burning when urinating, or frequent urination) *BOWEL PROBLEMS (unusual diarrhea, constipation, pain near the anus) TENDERNESS IN MOUTH AND THROAT WITH OR WITHOUT PRESENCE OF ULCERS (sore throat, sores in mouth, or a toothache) UNUSUAL RASH, SWELLING OR PAIN  UNUSUAL VAGINAL DISCHARGE OR ITCHING   Items with * indicate a potential emergency and should be followed up as soon as possible or go to the Emergency Department if any problems should occur.  Please show the CHEMOTHERAPY ALERT  CARD or IMMUNOTHERAPY ALERT CARD at check-in to the Emergency Department and triage nurse.  Should you have questions after your visit or need to cancel or reschedule your appointment, please contact [facility] [phone]  and follow the prompts.  Office hours are 8:00 a.m. to 4:30 p.m. Monday - Friday. Please note that voicemails left after 4:00 p.m. may not be returned until the following business day.  We are closed weekends and major holidays. You have access to a nurse at all times for urgent questions. Please call the main number to the clinic [phone] and follow the prompts.  For any non-urgent questions, you may also contact your provider using MyChart. We now offer e-Visits for anyone 18 and older to request care online for non-urgent symptoms. For details visit [email or web address].   Also download the MyChart app! Go to the app store, search "MyChart", open the app, select [facility], and log in with your MyChart username and password.   

## 2022-08-03 NOTE — Progress Notes (Signed)
Patient presents today for D2 Vidaza. Vital signs are stable. Patient has no complaints of side effects related to treatment.   Treatment given today per MD orders. Tolerated infusion without adverse affects. Vital signs stable. No complaints at this time. Discharged from clinic ambulatory in stable condition. Alert and oriented x 3. F/U with [facility] as scheduled.

## 2022-08-04 ENCOUNTER — Inpatient Hospital Stay: Payer: Medicare HMO

## 2022-08-04 ENCOUNTER — Other Ambulatory Visit: Payer: Self-pay

## 2022-08-04 VITALS — BP 90/63 | HR 74 | Temp 98.6°F | Resp 18

## 2022-08-04 DIAGNOSIS — Z5111 Encounter for antineoplastic chemotherapy: Secondary | ICD-10-CM | POA: Diagnosis not present

## 2022-08-04 DIAGNOSIS — G479 Sleep disorder, unspecified: Secondary | ICD-10-CM | POA: Diagnosis not present

## 2022-08-04 DIAGNOSIS — R0602 Shortness of breath: Secondary | ICD-10-CM | POA: Diagnosis not present

## 2022-08-04 DIAGNOSIS — C9310 Chronic myelomonocytic leukemia not having achieved remission: Secondary | ICD-10-CM

## 2022-08-04 DIAGNOSIS — R053 Chronic cough: Secondary | ICD-10-CM | POA: Diagnosis not present

## 2022-08-04 DIAGNOSIS — D696 Thrombocytopenia, unspecified: Secondary | ICD-10-CM | POA: Diagnosis not present

## 2022-08-04 DIAGNOSIS — Z95828 Presence of other vascular implants and grafts: Secondary | ICD-10-CM

## 2022-08-04 DIAGNOSIS — K219 Gastro-esophageal reflux disease without esophagitis: Secondary | ICD-10-CM | POA: Diagnosis not present

## 2022-08-04 DIAGNOSIS — J45909 Unspecified asthma, uncomplicated: Secondary | ICD-10-CM | POA: Diagnosis not present

## 2022-08-04 DIAGNOSIS — K5900 Constipation, unspecified: Secondary | ICD-10-CM | POA: Diagnosis not present

## 2022-08-04 MED ORDER — SODIUM CHLORIDE 0.9 % IV SOLN
75.0000 mg/m2 | Freq: Once | INTRAVENOUS | Status: AC
  Administered 2022-08-04: 125 mg via INTRAVENOUS
  Filled 2022-08-04: qty 12.5

## 2022-08-04 MED ORDER — PALONOSETRON HCL INJECTION 0.25 MG/5ML
0.2500 mg | Freq: Once | INTRAVENOUS | Status: AC
  Administered 2022-08-04: 0.25 mg via INTRAVENOUS
  Filled 2022-08-04: qty 5

## 2022-08-04 MED ORDER — SODIUM CHLORIDE 0.9 % IV SOLN: Freq: Once | INTRAVENOUS | Status: AC

## 2022-08-04 MED ORDER — HEPARIN SOD (PORK) LOCK FLUSH 100 UNIT/ML IV SOLN
500.0000 [IU] | Freq: Once | INTRAVENOUS | Status: AC
  Administered 2022-08-04: 500 [IU] via INTRAVENOUS

## 2022-08-04 MED ORDER — SODIUM CHLORIDE 0.9% FLUSH
10.0000 mL | Freq: Once | INTRAVENOUS | Status: AC
  Administered 2022-08-04: 10 mL via INTRAVENOUS

## 2022-08-04 NOTE — Progress Notes (Signed)
Patient presents today for Vidaza D3. Patient has no complaints of any side effects related to treatment.   Treatment given today per MD orders. Tolerated infusion without adverse affects. Vital signs stable. No complaints at this time. Discharged from clinic ambulatory in stable condition. Alert and oriented x 3. F/U with [facility] as scheduled.

## 2022-08-04 NOTE — Patient Instructions (Signed)
[facility]  Discharge Instructions: Thank you for choosing [facility] to provide your oncology and hematology care.  If you have a lab appointment with the Cancer Center, please come in thru the Main Entrance and check in at the main information desk.  Wear comfortable clothing and clothing appropriate for easy access to any Portacath or PICC line.   We strive to give you quality time with your provider. You may need to reschedule your appointment if you arrive late (15 or more minutes).  Arriving late affects you and other patients whose appointments are after yours.  Also, if you miss three or more appointments without notifying the office, you may be dismissed from the clinic at the provider's discretion.      For prescription refill requests, have your pharmacy contact our office and allow 72 hours for refills to be completed.    Today you received the following chemotherapy and/or immunotherapy agents Vidaza. Azacitidine Injection What is this medication? AZACITIDINE (ay za SITE i deen) treats blood and bone marrow cancers. It works by slowing down the growth of cancer cells. This medicine may be used for other purposes; ask your health care provider or pharmacist if you have questions. COMMON BRAND NAME(S): Vidaza What should I tell my care team before I take this medication? They need to know if you have any of these conditions: Kidney disease Liver disease Low blood cell levels, such as low white cells, platelets, or red blood cells Low levels of albumin in the blood Low levels of bicarbonate in the blood An unusual or allergic reaction to azacitidine, mannitol, other medications, foods, dyes, or preservatives If you or your partner are pregnant or trying to get pregnant Breast-feeding How should I use this medication? This medication is injected into a vein or under the skin. It is given by your care team in a hospital or clinic setting. Talk  to your care team about the use of this medication in children. While it may be prescribed for children as young as 1 month for selected conditions, precautions do apply. Overdosage: If you think you have taken too much of this medicine contact a poison control center or emergency room at once. NOTE: This medicine is only for you. Do not share this medicine with others. What if I miss a dose? Keep appointments for follow-up doses. It is important not to miss your dose. Call your care team if you are unable to keep an appointment. What may interact with this medication? Interactions are not expected. This list may not describe all possible interactions. Give your health care provider a list of all the medicines, herbs, non-prescription drugs, or dietary supplements you use. Also tell them if you smoke, drink alcohol, or use illegal drugs. Some items may interact with your medicine. What should I watch for while using this medication? Your condition will be monitored carefully while you are receiving this medication. You may need blood work while taking this medication. This medication may make you feel generally unwell. This is not uncommon as chemotherapy can affect healthy cells as well as cancer cells. Report any side effects. Continue your course of treatment even though you feel ill unless your care team tells you to stop. Other product types may be available that contain the medication azacitidine. The injection and oral products should not be used in place of one another. Talk to your care team if you have questions. This medication can cause serious side effects.   To reduce the risk, your care team may give you other medications to take before receiving this one. Be sure to follow the directions from your care team. This medication may increase your risk of getting an infection. Call your care team for advice if you get a fever, chills, sore throat, or other symptoms of a cold or flu. Do not treat  yourself. Try to avoid being around people who are sick. Avoid taking medications that contain aspirin, acetaminophen, ibuprofen, naproxen, or ketoprofen unless instructed by your care team. These medications may hide a fever. This medication may increase your risk to bruise or bleed. Call your care team if you notice any unusual bleeding. Be careful brushing or flossing your teeth or using a toothpick because you may get an infection or bleed more easily. If you have any dental work done, tell your dentist you are receiving this medication. Talk to your care team if you or your partner may be pregnant. Serious birth defects can occur if you take this medication during pregnancy and for 6 months after the last dose. You will need a negative pregnancy test before starting this medication. Contraception is recommended while taking his medication and for 6 months after the last dose. Your care team can help you find the option that works for you. If your partner can get pregnant, use a condom during sex while taking this medication and for 3 months after the last dose. Do not breastfeed while taking this medication and for 1 week after the last dose. This medication may cause infertility. Talk to your care team if you are concerned about your fertility. What side effects may I notice from receiving this medication? Side effects that you should report to your care team as soon as possible: Allergic reactions--skin rash, itching, hives, swelling of the face, lips, tongue, or throat Infection--fever, chills, cough, sore throat, wounds that don't heal, pain or trouble when passing urine, general feeling of discomfort or being unwell Kidney injury--decrease in the amount of urine, swelling of the ankles, hands, or feet Liver injury--right upper belly pain, loss of appetite, nausea, light-colored stool, dark yellow or brown urine, yellowing skin or eyes, unusual weakness or fatigue Low red blood cell  level--unusual weakness or fatigue, dizziness, headache, trouble breathing Tumor lysis syndrome (TLS)--nausea, vomiting, diarrhea, decrease in the amount of urine, dark urine, unusual weakness or fatigue, confusion, muscle pain or cramps, fast or irregular heartbeat, joint pain Unusual bruising or bleeding Side effects that usually do not require medical attention (report to your care team if they continue or are bothersome): Constipation Diarrhea Nausea Pain, redness, or irritation at injection site Vomiting This list may not describe all possible side effects. Call your doctor for medical advice about side effects. You may report side effects to FDA at 1-800-FDA-1088. Where should I keep my medication? This medication is given in a hospital or clinic. It will not be stored at home. NOTE: This sheet is a summary. It may not cover all possible information. If you have questions about this medicine, talk to your doctor, pharmacist, or health care provider.  2023 Elsevier/Gold Standard (2021-11-19 00:00:00)       To help prevent nausea and vomiting after your treatment, we encourage you to take your nausea medication as directed.  BELOW ARE SYMPTOMS THAT SHOULD BE REPORTED IMMEDIATELY: *FEVER GREATER THAN 100.4 F (38 C) OR HIGHER *CHILLS OR SWEATING *NAUSEA AND VOMITING THAT IS NOT CONTROLLED WITH YOUR NAUSEA MEDICATION *UNUSUAL SHORTNESS OF BREATH *UNUSUAL   BRUISING OR BLEEDING *URINARY PROBLEMS (pain or burning when urinating, or frequent urination) *BOWEL PROBLEMS (unusual diarrhea, constipation, pain near the anus) TENDERNESS IN MOUTH AND THROAT WITH OR WITHOUT PRESENCE OF ULCERS (sore throat, sores in mouth, or a toothache) UNUSUAL RASH, SWELLING OR PAIN  UNUSUAL VAGINAL DISCHARGE OR ITCHING   Items with * indicate a potential emergency and should be followed up as soon as possible or go to the Emergency Department if any problems should occur.  Please show the CHEMOTHERAPY ALERT  CARD or IMMUNOTHERAPY ALERT CARD at check-in to the Emergency Department and triage nurse.  Should you have questions after your visit or need to cancel or reschedule your appointment, please contact [facility] [phone]  and follow the prompts.  Office hours are 8:00 a.m. to 4:30 p.m. Monday - Friday. Please note that voicemails left after 4:00 p.m. may not be returned until the following business day.  We are closed weekends and major holidays. You have access to a nurse at all times for urgent questions. Please call the main number to the clinic [phone] and follow the prompts.  For any non-urgent questions, you may also contact your provider using MyChart. We now offer e-Visits for anyone 18 and older to request care online for non-urgent symptoms. For details visit [email or web address].   Also download the MyChart app! Go to the app store, search "MyChart", open the app, select [facility], and log in with your MyChart username and password.   

## 2022-08-05 ENCOUNTER — Inpatient Hospital Stay: Payer: Medicare HMO

## 2022-08-05 VITALS — BP 103/61 | HR 91 | Temp 98.6°F | Resp 16

## 2022-08-05 DIAGNOSIS — K219 Gastro-esophageal reflux disease without esophagitis: Secondary | ICD-10-CM | POA: Diagnosis not present

## 2022-08-05 DIAGNOSIS — C9310 Chronic myelomonocytic leukemia not having achieved remission: Secondary | ICD-10-CM

## 2022-08-05 DIAGNOSIS — D696 Thrombocytopenia, unspecified: Secondary | ICD-10-CM | POA: Diagnosis not present

## 2022-08-05 DIAGNOSIS — Z5111 Encounter for antineoplastic chemotherapy: Secondary | ICD-10-CM | POA: Diagnosis not present

## 2022-08-05 DIAGNOSIS — R053 Chronic cough: Secondary | ICD-10-CM | POA: Diagnosis not present

## 2022-08-05 DIAGNOSIS — J45909 Unspecified asthma, uncomplicated: Secondary | ICD-10-CM | POA: Diagnosis not present

## 2022-08-05 DIAGNOSIS — Z95828 Presence of other vascular implants and grafts: Secondary | ICD-10-CM

## 2022-08-05 DIAGNOSIS — R0602 Shortness of breath: Secondary | ICD-10-CM | POA: Diagnosis not present

## 2022-08-05 DIAGNOSIS — K5900 Constipation, unspecified: Secondary | ICD-10-CM | POA: Diagnosis not present

## 2022-08-05 DIAGNOSIS — G479 Sleep disorder, unspecified: Secondary | ICD-10-CM | POA: Diagnosis not present

## 2022-08-05 MED ORDER — SODIUM CHLORIDE 0.9 % IV SOLN: Freq: Once | INTRAVENOUS | Status: AC

## 2022-08-05 MED ORDER — SODIUM CHLORIDE 0.9% FLUSH
10.0000 mL | Freq: Once | INTRAVENOUS | Status: AC
  Administered 2022-08-05: 10 mL via INTRAVENOUS

## 2022-08-05 MED ORDER — SODIUM CHLORIDE 0.9% FLUSH
10.0000 mL | Freq: Once | INTRAVENOUS | Status: AC
  Administered 2022-08-05: 10 mL

## 2022-08-05 MED ORDER — HEPARIN SOD (PORK) LOCK FLUSH 100 UNIT/ML IV SOLN
500.0000 [IU] | Freq: Once | INTRAVENOUS | Status: AC
  Administered 2022-08-05: 500 [IU] via INTRAVENOUS

## 2022-08-05 MED ORDER — SODIUM CHLORIDE 0.9 % IV SOLN
75.0000 mg/m2 | Freq: Once | INTRAVENOUS | Status: AC
  Administered 2022-08-05: 125 mg via INTRAVENOUS
  Filled 2022-08-05: qty 12.5

## 2022-08-05 NOTE — Patient Instructions (Signed)
[facility]  Discharge Instructions: Thank you for choosing [facility] to provide your oncology and hematology care.  If you have a lab appointment with the Cancer Center, please come in thru the Main Entrance and check in at the main information desk.  Wear comfortable clothing and clothing appropriate for easy access to any Portacath or PICC line.   We strive to give you quality time with your provider. You may need to reschedule your appointment if you arrive late (15 or more minutes).  Arriving late affects you and other patients whose appointments are after yours.  Also, if you miss three or more appointments without notifying the office, you may be dismissed from the clinic at the provider's discretion.      For prescription refill requests, have your pharmacy contact our office and allow 72 hours for refills to be completed.    Today you received the following chemotherapy and/or immunotherapy agents Vidaza. Azacitidine Injection What is this medication? AZACITIDINE (ay za SITE i deen) treats blood and bone marrow cancers. It works by slowing down the growth of cancer cells. This medicine may be used for other purposes; ask your health care provider or pharmacist if you have questions. COMMON BRAND NAME(S): Vidaza What should I tell my care team before I take this medication? They need to know if you have any of these conditions: Kidney disease Liver disease Low blood cell levels, such as low white cells, platelets, or red blood cells Low levels of albumin in the blood Low levels of bicarbonate in the blood An unusual or allergic reaction to azacitidine, mannitol, other medications, foods, dyes, or preservatives If you or your partner are pregnant or trying to get pregnant Breast-feeding How should I use this medication? This medication is injected into a vein or under the skin. It is given by your care team in a hospital or clinic setting. Talk  to your care team about the use of this medication in children. While it may be prescribed for children as young as 1 month for selected conditions, precautions do apply. Overdosage: If you think you have taken too much of this medicine contact a poison control center or emergency room at once. NOTE: This medicine is only for you. Do not share this medicine with others. What if I miss a dose? Keep appointments for follow-up doses. It is important not to miss your dose. Call your care team if you are unable to keep an appointment. What may interact with this medication? Interactions are not expected. This list may not describe all possible interactions. Give your health care provider a list of all the medicines, herbs, non-prescription drugs, or dietary supplements you use. Also tell them if you smoke, drink alcohol, or use illegal drugs. Some items may interact with your medicine. What should I watch for while using this medication? Your condition will be monitored carefully while you are receiving this medication. You may need blood work while taking this medication. This medication may make you feel generally unwell. This is not uncommon as chemotherapy can affect healthy cells as well as cancer cells. Report any side effects. Continue your course of treatment even though you feel ill unless your care team tells you to stop. Other product types may be available that contain the medication azacitidine. The injection and oral products should not be used in place of one another. Talk to your care team if you have questions. This medication can cause serious side effects.   To reduce the risk, your care team may give you other medications to take before receiving this one. Be sure to follow the directions from your care team. This medication may increase your risk of getting an infection. Call your care team for advice if you get a fever, chills, sore throat, or other symptoms of a cold or flu. Do not treat  yourself. Try to avoid being around people who are sick. Avoid taking medications that contain aspirin, acetaminophen, ibuprofen, naproxen, or ketoprofen unless instructed by your care team. These medications may hide a fever. This medication may increase your risk to bruise or bleed. Call your care team if you notice any unusual bleeding. Be careful brushing or flossing your teeth or using a toothpick because you may get an infection or bleed more easily. If you have any dental work done, tell your dentist you are receiving this medication. Talk to your care team if you or your partner may be pregnant. Serious birth defects can occur if you take this medication during pregnancy and for 6 months after the last dose. You will need a negative pregnancy test before starting this medication. Contraception is recommended while taking his medication and for 6 months after the last dose. Your care team can help you find the option that works for you. If your partner can get pregnant, use a condom during sex while taking this medication and for 3 months after the last dose. Do not breastfeed while taking this medication and for 1 week after the last dose. This medication may cause infertility. Talk to your care team if you are concerned about your fertility. What side effects may I notice from receiving this medication? Side effects that you should report to your care team as soon as possible: Allergic reactions--skin rash, itching, hives, swelling of the face, lips, tongue, or throat Infection--fever, chills, cough, sore throat, wounds that don't heal, pain or trouble when passing urine, general feeling of discomfort or being unwell Kidney injury--decrease in the amount of urine, swelling of the ankles, hands, or feet Liver injury--right upper belly pain, loss of appetite, nausea, light-colored stool, dark yellow or brown urine, yellowing skin or eyes, unusual weakness or fatigue Low red blood cell  level--unusual weakness or fatigue, dizziness, headache, trouble breathing Tumor lysis syndrome (TLS)--nausea, vomiting, diarrhea, decrease in the amount of urine, dark urine, unusual weakness or fatigue, confusion, muscle pain or cramps, fast or irregular heartbeat, joint pain Unusual bruising or bleeding Side effects that usually do not require medical attention (report to your care team if they continue or are bothersome): Constipation Diarrhea Nausea Pain, redness, or irritation at injection site Vomiting This list may not describe all possible side effects. Call your doctor for medical advice about side effects. You may report side effects to FDA at 1-800-FDA-1088. Where should I keep my medication? This medication is given in a hospital or clinic. It will not be stored at home. NOTE: This sheet is a summary. It may not cover all possible information. If you have questions about this medicine, talk to your doctor, pharmacist, or health care provider.  2023 Elsevier/Gold Standard (2021-11-19 00:00:00)       To help prevent nausea and vomiting after your treatment, we encourage you to take your nausea medication as directed.  BELOW ARE SYMPTOMS THAT SHOULD BE REPORTED IMMEDIATELY: *FEVER GREATER THAN 100.4 F (38 C) OR HIGHER *CHILLS OR SWEATING *NAUSEA AND VOMITING THAT IS NOT CONTROLLED WITH YOUR NAUSEA MEDICATION *UNUSUAL SHORTNESS OF BREATH *UNUSUAL   BRUISING OR BLEEDING *URINARY PROBLEMS (pain or burning when urinating, or frequent urination) *BOWEL PROBLEMS (unusual diarrhea, constipation, pain near the anus) TENDERNESS IN MOUTH AND THROAT WITH OR WITHOUT PRESENCE OF ULCERS (sore throat, sores in mouth, or a toothache) UNUSUAL RASH, SWELLING OR PAIN  UNUSUAL VAGINAL DISCHARGE OR ITCHING   Items with * indicate a potential emergency and should be followed up as soon as possible or go to the Emergency Department if any problems should occur.  Please show the CHEMOTHERAPY ALERT  CARD or IMMUNOTHERAPY ALERT CARD at check-in to the Emergency Department and triage nurse.  Should you have questions after your visit or need to cancel or reschedule your appointment, please contact [facility] [phone]  and follow the prompts.  Office hours are 8:00 a.m. to 4:30 p.m. Monday - Friday. Please note that voicemails left after 4:00 p.m. may not be returned until the following business day.  We are closed weekends and major holidays. You have access to a nurse at all times for urgent questions. Please call the main number to the clinic [phone] and follow the prompts.  For any non-urgent questions, you may also contact your provider using MyChart. We now offer e-Visits for anyone 18 and older to request care online for non-urgent symptoms. For details visit [email or web address].   Also download the MyChart app! Go to the app store, search "MyChart", open the app, select [facility], and log in with your MyChart username and password.   

## 2022-08-05 NOTE — Progress Notes (Signed)
Patient presents today for D4C1 Vidaza. Vital signs within parameters for treatment. Patient has no complaints of any side effects related to treatment at this time.   Treatment given today per MD orders. Tolerated infusion without adverse affects. Vital signs stable. No complaints at this time. Discharged from clinic ambulatory in stable condition. Alert and oriented x 3. F/U with [facility] as scheduled.

## 2022-08-06 ENCOUNTER — Inpatient Hospital Stay: Payer: Medicare HMO

## 2022-08-06 VITALS — BP 103/60 | HR 70 | Temp 99.0°F | Resp 18

## 2022-08-06 DIAGNOSIS — J45909 Unspecified asthma, uncomplicated: Secondary | ICD-10-CM | POA: Diagnosis not present

## 2022-08-06 DIAGNOSIS — K219 Gastro-esophageal reflux disease without esophagitis: Secondary | ICD-10-CM | POA: Diagnosis not present

## 2022-08-06 DIAGNOSIS — C9310 Chronic myelomonocytic leukemia not having achieved remission: Secondary | ICD-10-CM | POA: Diagnosis not present

## 2022-08-06 DIAGNOSIS — R0602 Shortness of breath: Secondary | ICD-10-CM | POA: Diagnosis not present

## 2022-08-06 DIAGNOSIS — K5900 Constipation, unspecified: Secondary | ICD-10-CM | POA: Diagnosis not present

## 2022-08-06 DIAGNOSIS — Z95828 Presence of other vascular implants and grafts: Secondary | ICD-10-CM

## 2022-08-06 DIAGNOSIS — D696 Thrombocytopenia, unspecified: Secondary | ICD-10-CM | POA: Diagnosis not present

## 2022-08-06 DIAGNOSIS — Z5111 Encounter for antineoplastic chemotherapy: Secondary | ICD-10-CM | POA: Diagnosis not present

## 2022-08-06 DIAGNOSIS — G479 Sleep disorder, unspecified: Secondary | ICD-10-CM | POA: Diagnosis not present

## 2022-08-06 DIAGNOSIS — R053 Chronic cough: Secondary | ICD-10-CM | POA: Diagnosis not present

## 2022-08-06 MED ORDER — PALONOSETRON HCL INJECTION 0.25 MG/5ML
0.2500 mg | Freq: Once | INTRAVENOUS | Status: AC
  Administered 2022-08-06: 0.25 mg via INTRAVENOUS
  Filled 2022-08-06: qty 5

## 2022-08-06 MED ORDER — SODIUM CHLORIDE 0.9 % IV SOLN
75.0000 mg/m2 | Freq: Once | INTRAVENOUS | Status: AC
  Administered 2022-08-06: 125 mg via INTRAVENOUS
  Filled 2022-08-06: qty 12.5

## 2022-08-06 MED ORDER — SODIUM CHLORIDE 0.9% FLUSH
10.0000 mL | Freq: Once | INTRAVENOUS | Status: AC
  Administered 2022-08-06: 10 mL via INTRAVENOUS

## 2022-08-06 MED ORDER — HEPARIN SOD (PORK) LOCK FLUSH 100 UNIT/ML IV SOLN
500.0000 [IU] | Freq: Once | INTRAVENOUS | Status: AC
  Administered 2022-08-06: 500 [IU] via INTRAVENOUS

## 2022-08-06 MED ORDER — SODIUM CHLORIDE 0.9% FLUSH
10.0000 mL | Freq: Once | INTRAVENOUS | Status: AC
  Administered 2022-08-06: 10 mL

## 2022-08-06 MED ORDER — SODIUM CHLORIDE 0.9 % IV SOLN: Freq: Once | INTRAVENOUS | Status: AC

## 2022-08-06 NOTE — Patient Instructions (Signed)
[facility]  Discharge Instructions: Thank you for choosing [facility] to provide your oncology and hematology care.  If you have a lab appointment with the [facility], please come in thru the Main Entrance and check in at the main information desk.  Wear comfortable clothing and clothing appropriate for easy access to any Portacath or PICC line.   We strive to give you quality time with your provider. You may need to reschedule your appointment if you arrive late (15 or more minutes).  Arriving late affects you and other patients whose appointments are after yours.  Also, if you miss three or more appointments without notifying the office, you may be dismissed from the clinic at the provider's discretion.      For prescription refill requests, have your pharmacy contact our office and allow 72 hours for refills to be completed.    Today you received the following chemotherapy and/or immunotherapy agents vidaza.Azacitidine Injection What is this medication? AZACITIDINE (ay [place]) treats blood and bone marrow cancers. It works by slowing down the growth of cancer cells. This medicine may be used for other purposes; ask your health care provider or pharmacist if you have questions. COMMON BRAND NAME(S): Vidaza What should I tell my care team before I take this medication? They need to know if you have any of these conditions: Kidney disease Liver disease Low blood cell levels, such as low white cells, platelets, or red blood cells Low levels of albumin in the blood Low levels of bicarbonate in the blood An unusual or allergic reaction to azacitidine, mannitol, other medications, foods, dyes, or preservatives If you or your partner are pregnant or trying to get pregnant Breast-feeding How should I use this medication? This medication is injected into a vein or under the skin. It is given by your care team in a hospital or clinic setting. Talk  to your care team about the use of this medication in children. While it may be prescribed for children as young as 1 month for selected conditions, precautions do apply. Overdosage: If you think you have taken too much of this medicine contact a poison control center or emergency room at once. NOTE: This medicine is only for you. Do not share this medicine with others. What if I miss a dose? Keep appointments for follow-up doses. It is important not to miss your dose. Call your care team if you are unable to keep an appointment. What may interact with this medication? Interactions are not expected. This list may not describe all possible interactions. Give your health care provider a list of all the medicines, herbs, non-prescription drugs, or dietary supplements you use. Also tell them if you smoke, drink alcohol, or use illegal drugs. Some items may interact with your medicine. What should I watch for while using this medication? Your condition will be monitored carefully while you are receiving this medication. You may need blood work while taking this medication. This medication may make you feel generally unwell. This is not uncommon as chemotherapy can affect healthy cells as well as cancer cells. Report any side effects. Continue your course of treatment even though you feel ill unless your care team tells you to stop. Other product types may be available that contain the medication azacitidine. The injection and oral products should not be used in place of one another. Talk to your care team if you have questions. This medication can cause serious side effects. To  reduce the risk, your care team may give you other medications to take before receiving this one. Be sure to follow the directions from your care team. This medication may increase your risk of getting an infection. Call your care team for advice if you get a fever, chills, sore throat, or other symptoms of a cold or flu. Do not treat  yourself. Try to avoid being around people who are sick. Avoid taking medications that contain aspirin, acetaminophen, ibuprofen, naproxen, or ketoprofen unless instructed by your care team. These medications may hide a fever. This medication may increase your risk to bruise or bleed. Call your care team if you notice any unusual bleeding. Be careful brushing or flossing your teeth or using a toothpick because you may get an infection or bleed more easily. If you have any dental work done, tell your dentist you are receiving this medication. Talk to your care team if you or your partner may be pregnant. Serious birth defects can occur if you take this medication during pregnancy and for 6 months after the last dose. You will need a negative pregnancy test before starting this medication. Contraception is recommended while taking his medication and for 6 months after the last dose. Your care team can help you find the option that works for you. If your partner can get pregnant, use a condom during sex while taking this medication and for 3 months after the last dose. Do not breastfeed while taking this medication and for 1 week after the last dose. This medication may cause infertility. Talk to your care team if you are concerned about your fertility. What side effects may I notice from receiving this medication? Side effects that you should report to your care team as soon as possible: Allergic reactions--skin rash, itching, hives, swelling of the face, lips, tongue, or throat Infection--fever, chills, cough, sore throat, wounds that don't heal, pain or trouble when passing urine, general feeling of discomfort or being unwell Kidney injury--decrease in the amount of urine, swelling of the ankles, hands, or feet Liver injury--right upper belly pain, loss of appetite, nausea, light-colored stool, dark yellow or brown urine, yellowing skin or eyes, unusual weakness or fatigue Low red blood cell  level--unusual weakness or fatigue, dizziness, headache, trouble breathing Tumor lysis syndrome (TLS)--nausea, vomiting, diarrhea, decrease in the amount of urine, dark urine, unusual weakness or fatigue, confusion, muscle pain or cramps, fast or irregular heartbeat, joint pain Unusual bruising or bleeding Side effects that usually do not require medical attention (report to your care team if they continue or are bothersome): Constipation Diarrhea Nausea Pain, redness, or irritation at injection site Vomiting This list may not describe all possible side effects. Call your doctor for medical advice about side effects. You may report side effects to FDA at 1-800-FDA-1088. Where should I keep my medication? This medication is given in a hospital or clinic. It will not be stored at home. NOTE: This sheet is a summary. It may not cover all possible information. If you have questions about this medicine, talk to your doctor, pharmacist, or health care provider.  2023 Elsevier/Gold Standard (2021-11-19 00:00:00)       To help prevent nausea and vomiting after your treatment, we encourage you to take your nausea medication as directed.  BELOW ARE SYMPTOMS THAT SHOULD BE REPORTED IMMEDIATELY: *FEVER GREATER THAN 100.4 F (38 C) OR HIGHER *CHILLS OR SWEATING *NAUSEA AND VOMITING THAT IS NOT CONTROLLED WITH YOUR NAUSEA MEDICATION *UNUSUAL SHORTNESS OF BREATH *UNUSUAL BRUISING  OR BLEEDING *URINARY PROBLEMS (pain or burning when urinating, or frequent urination) *BOWEL PROBLEMS (unusual diarrhea, constipation, pain near the anus) TENDERNESS IN MOUTH AND THROAT WITH OR WITHOUT PRESENCE OF ULCERS (sore throat, sores in mouth, or a toothache) UNUSUAL RASH, SWELLING OR PAIN  UNUSUAL VAGINAL DISCHARGE OR ITCHING   Items with * indicate a potential emergency and should be followed up as soon as possible or go to the Emergency Department if any problems should occur.  Please show the CHEMOTHERAPY ALERT  CARD or IMMUNOTHERAPY ALERT CARD at check-in to the Emergency Department and triage nurse.  Should you have questions after your visit or need to cancel or reschedule your appointment, please contact [facility] [phone]  and follow the prompts.  Office hours are 8:00 a.m. to 4:30 p.m. Monday - Friday. Please note that voicemails left after 4:00 p.m. may not be returned until the following business day.  We are closed weekends and major holidays. You have access to a nurse at all times for urgent questions. Please call the main number to the clinic [phone] and follow the prompts.  For any non-urgent questions, you may also contact your provider using MyChart. We now offer e-Visits for anyone 79 and older to request care online for non-urgent symptoms. For details visit [email or web address].   Also download the MyChart app! Go to the app store, search "MyChart", open the app, select [place], and log in with your MyChart username and password.

## 2022-08-06 NOTE — Progress Notes (Signed)
Patient presents today for D5 Vidaza. Vital signs stable. Patient states she now has fatigue not relieved by rest and that is a change from yesterday. Patient denies any nausea, diarrhea, or pain. No other complaints noted.   Vidaza given today per MD orders. Tolerated infusion without adverse affects. Vital signs stable. No complaints at this time. Discharged from clinic ambulatory in stable condition. Alert and oriented x 3. F/U with [facility] as scheduled.

## 2022-08-09 ENCOUNTER — Inpatient Hospital Stay: Payer: Medicare HMO

## 2022-08-09 VITALS — BP 104/67 | HR 73 | Temp 98.6°F | Resp 18

## 2022-08-09 DIAGNOSIS — R0602 Shortness of breath: Secondary | ICD-10-CM | POA: Diagnosis not present

## 2022-08-09 DIAGNOSIS — Z5111 Encounter for antineoplastic chemotherapy: Secondary | ICD-10-CM | POA: Diagnosis not present

## 2022-08-09 DIAGNOSIS — D696 Thrombocytopenia, unspecified: Secondary | ICD-10-CM | POA: Diagnosis not present

## 2022-08-09 DIAGNOSIS — K5900 Constipation, unspecified: Secondary | ICD-10-CM | POA: Diagnosis not present

## 2022-08-09 DIAGNOSIS — J45909 Unspecified asthma, uncomplicated: Secondary | ICD-10-CM | POA: Diagnosis not present

## 2022-08-09 DIAGNOSIS — C9310 Chronic myelomonocytic leukemia not having achieved remission: Secondary | ICD-10-CM

## 2022-08-09 DIAGNOSIS — G479 Sleep disorder, unspecified: Secondary | ICD-10-CM | POA: Diagnosis not present

## 2022-08-09 DIAGNOSIS — Z95828 Presence of other vascular implants and grafts: Secondary | ICD-10-CM

## 2022-08-09 DIAGNOSIS — R053 Chronic cough: Secondary | ICD-10-CM | POA: Diagnosis not present

## 2022-08-09 DIAGNOSIS — K219 Gastro-esophageal reflux disease without esophagitis: Secondary | ICD-10-CM | POA: Diagnosis not present

## 2022-08-09 LAB — CBC WITH DIFFERENTIAL/PLATELET
Abs Immature Granulocytes: 0.3 10*3/uL — ABNORMAL HIGH (ref 0.00–0.07)
Basophils Absolute: 0 10*3/uL (ref 0.0–0.1)
Basophils Relative: 0 %
Eosinophils Absolute: 0 10*3/uL (ref 0.0–0.5)
Eosinophils Relative: 2 %
HCT: 29.6 % — ABNORMAL LOW (ref 36.0–46.0)
Hemoglobin: 9 g/dL — ABNORMAL LOW (ref 12.0–15.0)
Lymphocytes Relative: 28 %
Lymphs Abs: 0.7 10*3/uL (ref 0.7–4.0)
MCH: 32.6 pg (ref 26.0–34.0)
MCHC: 30.4 g/dL (ref 30.0–36.0)
MCV: 107.2 fL — ABNORMAL HIGH (ref 80.0–100.0)
Metamyelocytes Relative: 6 %
Monocytes Absolute: 0.6 10*3/uL (ref 0.1–1.0)
Monocytes Relative: 24 %
Myelocytes: 4 %
Neutro Abs: 0.8 10*3/uL — ABNORMAL LOW (ref 1.7–7.7)
Neutrophils Relative %: 35 %
Platelets: 39 10*3/uL — ABNORMAL LOW (ref 150–400)
Promyelocytes Relative: 1 %
RBC: 2.76 MIL/uL — ABNORMAL LOW (ref 3.87–5.11)
RDW: 16.3 % — ABNORMAL HIGH (ref 11.5–15.5)
WBC: 2.4 10*3/uL — ABNORMAL LOW (ref 4.0–10.5)
nRBC: 0 % (ref 0.0–0.2)

## 2022-08-09 LAB — COMPREHENSIVE METABOLIC PANEL
ALT: 14 U/L (ref 0–44)
AST: 19 U/L (ref 15–41)
Albumin: 4.1 g/dL (ref 3.5–5.0)
Alkaline Phosphatase: 44 U/L (ref 38–126)
Anion gap: 8 (ref 5–15)
BUN: 23 mg/dL (ref 8–23)
CO2: 24 mmol/L (ref 22–32)
Calcium: 9.2 mg/dL (ref 8.9–10.3)
Chloride: 105 mmol/L (ref 98–111)
Creatinine, Ser: 1.15 mg/dL — ABNORMAL HIGH (ref 0.44–1.00)
GFR, Estimated: 51 mL/min — ABNORMAL LOW (ref >60)
Glucose, Bld: 108 mg/dL — ABNORMAL HIGH (ref 70–99)
Potassium: 3.9 mmol/L (ref 3.5–5.1)
Sodium: 137 mmol/L (ref 135–145)
Total Bilirubin: 1.3 mg/dL — ABNORMAL HIGH (ref 0.3–1.2)
Total Protein: 7 g/dL (ref 6.5–8.1)

## 2022-08-09 LAB — MAGNESIUM: Magnesium: 2.2 mg/dL (ref 1.7–2.4)

## 2022-08-09 MED ORDER — SODIUM CHLORIDE 0.9% FLUSH
10.0000 mL | INTRAVENOUS | Status: DC | PRN
  Administered 2022-08-09: 10 mL via INTRAVENOUS

## 2022-08-09 MED ORDER — SODIUM CHLORIDE 0.9 % IV SOLN
75.0000 mg/m2 | Freq: Once | INTRAVENOUS | Status: AC
  Administered 2022-08-09: 125 mg via INTRAVENOUS
  Filled 2022-08-09: qty 12.5

## 2022-08-09 MED ORDER — SODIUM CHLORIDE 0.9 % IV SOLN: Freq: Once | INTRAVENOUS | Status: AC

## 2022-08-09 MED ORDER — PALONOSETRON HCL INJECTION 0.25 MG/5ML
0.2500 mg | Freq: Once | INTRAVENOUS | Status: AC
  Administered 2022-08-09: 0.25 mg via INTRAVENOUS
  Filled 2022-08-09: qty 5

## 2022-08-09 MED ORDER — SODIUM CHLORIDE 0.9% FLUSH
10.0000 mL | Freq: Once | INTRAVENOUS | Status: AC
  Administered 2022-08-09: 10 mL via INTRAVENOUS

## 2022-08-09 MED ORDER — HEPARIN SOD (PORK) LOCK FLUSH 100 UNIT/ML IV SOLN
500.0000 [IU] | Freq: Once | INTRAVENOUS | Status: AC
  Administered 2022-08-09: 500 [IU] via INTRAVENOUS

## 2022-08-09 NOTE — Progress Notes (Signed)
Patients port flushed without difficulty.  Good blood return noted with no bruising or swelling noted at site.  Patient remains accessed for chemotherapy treatment.  

## 2022-08-09 NOTE — Patient Instructions (Signed)
[facility]  Discharge Instructions: Thank you for choosing [facility] to provide your oncology and hematology care.  If you have a lab appointment with the Cancer Center, please come in thru the Main Entrance and check in at the main information desk.  Wear comfortable clothing and clothing appropriate for easy access to any Portacath or PICC line.   We strive to give you quality time with your provider. You may need to reschedule your appointment if you arrive late (15 or more minutes).  Arriving late affects you and other patients whose appointments are after yours.  Also, if you miss three or more appointments without notifying the office, you may be dismissed from the clinic at the provider's discretion.      For prescription refill requests, have your pharmacy contact our office and allow 72 hours for refills to be completed.    Today you received the following chemotherapy and/or immunotherapy agents Vidaza. Azacitidine Injection What is this medication? AZACITIDINE (ay za SITE i deen) treats blood and bone marrow cancers. It works by slowing down the growth of cancer cells. This medicine may be used for other purposes; ask your health care provider or pharmacist if you have questions. COMMON BRAND NAME(S): Vidaza What should I tell my care team before I take this medication? They need to know if you have any of these conditions: Kidney disease Liver disease Low blood cell levels, such as low white cells, platelets, or red blood cells Low levels of albumin in the blood Low levels of bicarbonate in the blood An unusual or allergic reaction to azacitidine, mannitol, other medications, foods, dyes, or preservatives If you or your partner are pregnant or trying to get pregnant Breast-feeding How should I use this medication? This medication is injected into a vein or under the skin. It is given by your care team in a hospital or clinic setting. Talk  to your care team about the use of this medication in children. While it may be prescribed for children as young as 1 month for selected conditions, precautions do apply. Overdosage: If you think you have taken too much of this medicine contact a poison control center or emergency room at once. NOTE: This medicine is only for you. Do not share this medicine with others. What if I miss a dose? Keep appointments for follow-up doses. It is important not to miss your dose. Call your care team if you are unable to keep an appointment. What may interact with this medication? Interactions are not expected. This list may not describe all possible interactions. Give your health care provider a list of all the medicines, herbs, non-prescription drugs, or dietary supplements you use. Also tell them if you smoke, drink alcohol, or use illegal drugs. Some items may interact with your medicine. What should I watch for while using this medication? Your condition will be monitored carefully while you are receiving this medication. You may need blood work while taking this medication. This medication may make you feel generally unwell. This is not uncommon as chemotherapy can affect healthy cells as well as cancer cells. Report any side effects. Continue your course of treatment even though you feel ill unless your care team tells you to stop. Other product types may be available that contain the medication azacitidine. The injection and oral products should not be used in place of one another. Talk to your care team if you have questions. This medication can cause serious side effects.   To reduce the risk, your care team may give you other medications to take before receiving this one. Be sure to follow the directions from your care team. This medication may increase your risk of getting an infection. Call your care team for advice if you get a fever, chills, sore throat, or other symptoms of a cold or flu. Do not treat  yourself. Try to avoid being around people who are sick. Avoid taking medications that contain aspirin, acetaminophen, ibuprofen, naproxen, or ketoprofen unless instructed by your care team. These medications may hide a fever. This medication may increase your risk to bruise or bleed. Call your care team if you notice any unusual bleeding. Be careful brushing or flossing your teeth or using a toothpick because you may get an infection or bleed more easily. If you have any dental work done, tell your dentist you are receiving this medication. Talk to your care team if you or your partner may be pregnant. Serious birth defects can occur if you take this medication during pregnancy and for 6 months after the last dose. You will need a negative pregnancy test before starting this medication. Contraception is recommended while taking his medication and for 6 months after the last dose. Your care team can help you find the option that works for you. If your partner can get pregnant, use a condom during sex while taking this medication and for 3 months after the last dose. Do not breastfeed while taking this medication and for 1 week after the last dose. This medication may cause infertility. Talk to your care team if you are concerned about your fertility. What side effects may I notice from receiving this medication? Side effects that you should report to your care team as soon as possible: Allergic reactions--skin rash, itching, hives, swelling of the face, lips, tongue, or throat Infection--fever, chills, cough, sore throat, wounds that don't heal, pain or trouble when passing urine, general feeling of discomfort or being unwell Kidney injury--decrease in the amount of urine, swelling of the ankles, hands, or feet Liver injury--right upper belly pain, loss of appetite, nausea, light-colored stool, dark yellow or Aryonna Gunnerson urine, yellowing skin or eyes, unusual weakness or fatigue Low red blood cell  level--unusual weakness or fatigue, dizziness, headache, trouble breathing Tumor lysis syndrome (TLS)--nausea, vomiting, diarrhea, decrease in the amount of urine, dark urine, unusual weakness or fatigue, confusion, muscle pain or cramps, fast or irregular heartbeat, joint pain Unusual bruising or bleeding Side effects that usually do not require medical attention (report to your care team if they continue or are bothersome): Constipation Diarrhea Nausea Pain, redness, or irritation at injection site Vomiting This list may not describe all possible side effects. Call your doctor for medical advice about side effects. You may report side effects to FDA at 1-800-FDA-1088. Where should I keep my medication? This medication is given in a hospital or clinic. It will not be stored at home. NOTE: This sheet is a summary. It may not cover all possible information. If you have questions about this medicine, talk to your doctor, pharmacist, or health care provider.  2023 Elsevier/Gold Standard (2021-11-19 00:00:00)       To help prevent nausea and vomiting after your treatment, we encourage you to take your nausea medication as directed.  BELOW ARE SYMPTOMS THAT SHOULD BE REPORTED IMMEDIATELY: *FEVER GREATER THAN 100.4 F (38 C) OR HIGHER *CHILLS OR SWEATING *NAUSEA AND VOMITING THAT IS NOT CONTROLLED WITH YOUR NAUSEA MEDICATION *UNUSUAL SHORTNESS OF BREATH *UNUSUAL   BRUISING OR BLEEDING *URINARY PROBLEMS (pain or burning when urinating, or frequent urination) *BOWEL PROBLEMS (unusual diarrhea, constipation, pain near the anus) TENDERNESS IN MOUTH AND THROAT WITH OR WITHOUT PRESENCE OF ULCERS (sore throat, sores in mouth, or a toothache) UNUSUAL RASH, SWELLING OR PAIN  UNUSUAL VAGINAL DISCHARGE OR ITCHING   Items with * indicate a potential emergency and should be followed up as soon as possible or go to the Emergency Department if any problems should occur.  Please show the CHEMOTHERAPY ALERT  CARD or IMMUNOTHERAPY ALERT CARD at check-in to the Emergency Department and triage nurse.  Should you have questions after your visit or need to cancel or reschedule your appointment, please contact [facility] [phone]  and follow the prompts.  Office hours are 8:00 a.m. to 4:30 p.m. Monday - Friday. Please note that voicemails left after 4:00 p.m. may not be returned until the following business day.  We are closed weekends and major holidays. You have access to a nurse at all times for urgent questions. Please call the main number to the clinic [phone] and follow the prompts.  For any non-urgent questions, you may also contact your provider using MyChart. We now offer e-Visits for anyone 18 and older to request care online for non-urgent symptoms. For details visit [email or web address].   Also download the MyChart app! Go to the app store, search "MyChart", open the app, select [place], and log in with your MyChart username and password.   

## 2022-08-09 NOTE — Progress Notes (Signed)
Patient presents today for Vidaza infusion.  Patient is in satisfactory condition with new complaints of slight nausea and weakness.  Vital signs are stable.  Labs reviewed.  Platelets are 39.  MD made aware.  All other labs are within treatment parameters.  We will proceed with treatment per MD orders.    Patient tolerated treatment well with no complaints voiced.  Patient left ambulatory in stable condition.  Vital signs stable at discharge.  Follow up as scheduled.

## 2022-08-10 ENCOUNTER — Other Ambulatory Visit: Payer: Self-pay

## 2022-08-10 ENCOUNTER — Inpatient Hospital Stay: Payer: Medicare HMO

## 2022-08-10 VITALS — BP 98/60 | HR 78 | Temp 98.8°F | Resp 18

## 2022-08-10 DIAGNOSIS — K5900 Constipation, unspecified: Secondary | ICD-10-CM | POA: Diagnosis not present

## 2022-08-10 DIAGNOSIS — R053 Chronic cough: Secondary | ICD-10-CM | POA: Diagnosis not present

## 2022-08-10 DIAGNOSIS — R0602 Shortness of breath: Secondary | ICD-10-CM | POA: Diagnosis not present

## 2022-08-10 DIAGNOSIS — G479 Sleep disorder, unspecified: Secondary | ICD-10-CM | POA: Diagnosis not present

## 2022-08-10 DIAGNOSIS — K219 Gastro-esophageal reflux disease without esophagitis: Secondary | ICD-10-CM | POA: Diagnosis not present

## 2022-08-10 DIAGNOSIS — Z95828 Presence of other vascular implants and grafts: Secondary | ICD-10-CM

## 2022-08-10 DIAGNOSIS — C9310 Chronic myelomonocytic leukemia not having achieved remission: Secondary | ICD-10-CM

## 2022-08-10 DIAGNOSIS — Z5111 Encounter for antineoplastic chemotherapy: Secondary | ICD-10-CM | POA: Diagnosis not present

## 2022-08-10 DIAGNOSIS — D696 Thrombocytopenia, unspecified: Secondary | ICD-10-CM | POA: Diagnosis not present

## 2022-08-10 DIAGNOSIS — J45909 Unspecified asthma, uncomplicated: Secondary | ICD-10-CM | POA: Diagnosis not present

## 2022-08-10 MED ORDER — SODIUM CHLORIDE 0.9% FLUSH
10.0000 mL | INTRAVENOUS | Status: AC
  Administered 2022-08-10: 10 mL

## 2022-08-10 MED ORDER — SODIUM CHLORIDE 0.9 % IV SOLN
75.0000 mg/m2 | Freq: Once | INTRAVENOUS | Status: AC
  Administered 2022-08-10: 125 mg via INTRAVENOUS
  Filled 2022-08-10: qty 12.5

## 2022-08-10 MED ORDER — HEPARIN SOD (PORK) LOCK FLUSH 100 UNIT/ML IV SOLN
500.0000 [IU] | Freq: Once | INTRAVENOUS | Status: AC
  Administered 2022-08-10: 500 [IU] via INTRAVENOUS

## 2022-08-10 MED ORDER — SODIUM CHLORIDE 0.9% FLUSH
10.0000 mL | Freq: Once | INTRAVENOUS | Status: AC
  Administered 2022-08-10: 10 mL via INTRAVENOUS

## 2022-08-10 MED ORDER — SODIUM CHLORIDE 0.9 % IV SOLN: Freq: Once | INTRAVENOUS | Status: AC

## 2022-08-10 NOTE — Patient Instructions (Signed)
[facility]  Discharge Instructions: Thank you for choosing [facility] to provide your oncology and hematology care.  If you have a lab appointment with the Cancer Center, please come in thru the Main Entrance and check in at the main information desk.  Wear comfortable clothing and clothing appropriate for easy access to any Portacath or PICC line.   We strive to give you quality time with your provider. You may need to reschedule your appointment if you arrive late (15 or more minutes).  Arriving late affects you and other patients whose appointments are after yours.  Also, if you miss three or more appointments without notifying the office, you may be dismissed from the clinic at the provider's discretion.      For prescription refill requests, have your pharmacy contact our office and allow 72 hours for refills to be completed.    Today you received the following chemotherapy and/or immunotherapy agents Vidaza. Azacitidine Injection What is this medication? AZACITIDINE (ay za SITE i deen) treats blood and bone marrow cancers. It works by slowing down the growth of cancer cells. This medicine may be used for other purposes; ask your health care provider or pharmacist if you have questions. COMMON BRAND NAME(S): Vidaza What should I tell my care team before I take this medication? They need to know if you have any of these conditions: Kidney disease Liver disease Low blood cell levels, such as low white cells, platelets, or red blood cells Low levels of albumin in the blood Low levels of bicarbonate in the blood An unusual or allergic reaction to azacitidine, mannitol, other medications, foods, dyes, or preservatives If you or your partner are pregnant or trying to get pregnant Breast-feeding How should I use this medication? This medication is injected into a vein or under the skin. It is given by your care team in a hospital or clinic setting. Talk  to your care team about the use of this medication in children. While it may be prescribed for children as young as 1 month for selected conditions, precautions do apply. Overdosage: If you think you have taken too much of this medicine contact a poison control center or emergency room at once. NOTE: This medicine is only for you. Do not share this medicine with others. What if I miss a dose? Keep appointments for follow-up doses. It is important not to miss your dose. Call your care team if you are unable to keep an appointment. What may interact with this medication? Interactions are not expected. This list may not describe all possible interactions. Give your health care provider a list of all the medicines, herbs, non-prescription drugs, or dietary supplements you use. Also tell them if you smoke, drink alcohol, or use illegal drugs. Some items may interact with your medicine. What should I watch for while using this medication? Your condition will be monitored carefully while you are receiving this medication. You may need blood work while taking this medication. This medication may make you feel generally unwell. This is not uncommon as chemotherapy can affect healthy cells as well as cancer cells. Report any side effects. Continue your course of treatment even though you feel ill unless your care team tells you to stop. Other product types may be available that contain the medication azacitidine. The injection and oral products should not be used in place of one another. Talk to your care team if you have questions. This medication can cause serious side effects.   To reduce the risk, your care team may give you other medications to take before receiving this one. Be sure to follow the directions from your care team. This medication may increase your risk of getting an infection. Call your care team for advice if you get a fever, chills, sore throat, or other symptoms of a cold or flu. Do not treat  yourself. Try to avoid being around people who are sick. Avoid taking medications that contain aspirin, acetaminophen, ibuprofen, naproxen, or ketoprofen unless instructed by your care team. These medications may hide a fever. This medication may increase your risk to bruise or bleed. Call your care team if you notice any unusual bleeding. Be careful brushing or flossing your teeth or using a toothpick because you may get an infection or bleed more easily. If you have any dental work done, tell your dentist you are receiving this medication. Talk to your care team if you or your partner may be pregnant. Serious birth defects can occur if you take this medication during pregnancy and for 6 months after the last dose. You will need a negative pregnancy test before starting this medication. Contraception is recommended while taking his medication and for 6 months after the last dose. Your care team can help you find the option that works for you. If your partner can get pregnant, use a condom during sex while taking this medication and for 3 months after the last dose. Do not breastfeed while taking this medication and for 1 week after the last dose. This medication may cause infertility. Talk to your care team if you are concerned about your fertility. What side effects may I notice from receiving this medication? Side effects that you should report to your care team as soon as possible: Allergic reactions--skin rash, itching, hives, swelling of the face, lips, tongue, or throat Infection--fever, chills, cough, sore throat, wounds that don't heal, pain or trouble when passing urine, general feeling of discomfort or being unwell Kidney injury--decrease in the amount of urine, swelling of the ankles, hands, or feet Liver injury--right upper belly pain, loss of appetite, nausea, light-colored stool, dark yellow or brown urine, yellowing skin or eyes, unusual weakness or fatigue Low red blood cell  level--unusual weakness or fatigue, dizziness, headache, trouble breathing Tumor lysis syndrome (TLS)--nausea, vomiting, diarrhea, decrease in the amount of urine, dark urine, unusual weakness or fatigue, confusion, muscle pain or cramps, fast or irregular heartbeat, joint pain Unusual bruising or bleeding Side effects that usually do not require medical attention (report to your care team if they continue or are bothersome): Constipation Diarrhea Nausea Pain, redness, or irritation at injection site Vomiting This list may not describe all possible side effects. Call your doctor for medical advice about side effects. You may report side effects to FDA at 1-800-FDA-1088. Where should I keep my medication? This medication is given in a hospital or clinic. It will not be stored at home. NOTE: This sheet is a summary. It may not cover all possible information. If you have questions about this medicine, talk to your doctor, pharmacist, or health care provider.  2023 Elsevier/Gold Standard (2021-11-19 00:00:00)       To help prevent nausea and vomiting after your treatment, we encourage you to take your nausea medication as directed.  BELOW ARE SYMPTOMS THAT SHOULD BE REPORTED IMMEDIATELY: *FEVER GREATER THAN 100.4 F (38 C) OR HIGHER *CHILLS OR SWEATING *NAUSEA AND VOMITING THAT IS NOT CONTROLLED WITH YOUR NAUSEA MEDICATION *UNUSUAL SHORTNESS OF BREATH *UNUSUAL   BRUISING OR BLEEDING *URINARY PROBLEMS (pain or burning when urinating, or frequent urination) *BOWEL PROBLEMS (unusual diarrhea, constipation, pain near the anus) TENDERNESS IN MOUTH AND THROAT WITH OR WITHOUT PRESENCE OF ULCERS (sore throat, sores in mouth, or a toothache) UNUSUAL RASH, SWELLING OR PAIN  UNUSUAL VAGINAL DISCHARGE OR ITCHING   Items with * indicate a potential emergency and should be followed up as soon as possible or go to the Emergency Department if any problems should occur.  Please show the CHEMOTHERAPY ALERT  CARD or IMMUNOTHERAPY ALERT CARD at check-in to the Emergency Department and triage nurse.  Should you have questions after your visit or need to cancel or reschedule your appointment, please contact [facility] [phone]  and follow the prompts.  Office hours are 8:00 a.m. to 4:30 p.m. Monday - Friday. Please note that voicemails left after 4:00 p.m. may not be returned until the following business day.  We are closed weekends and major holidays. You have access to a nurse at all times for urgent questions. Please call the main number to the clinic [phone] and follow the prompts.  For any non-urgent questions, you may also contact your provider using MyChart. We now offer e-Visits for anyone 18 and older to request care online for non-urgent symptoms. For details visit [email or web address].   Also download the MyChart app! Go to the app store, search "MyChart", open the app, select [place], and log in with your MyChart username and password.   

## 2022-08-10 NOTE — Progress Notes (Signed)
Vidaza D6 given today per MD orders. Tolerated infusion without adverse affects. Vital signs stable. No complaints at this time. Discharged from clinic ambulatory in stable condition. Alert and oriented x 3. F/U with [facility] as scheduled.

## 2022-08-11 NOTE — Progress Notes (Signed)
24 hour call back,  Patient is eating well and has had no nausea or vomiting.  Complains of constipation and is currently taking OTC medication for it.

## 2022-08-16 ENCOUNTER — Inpatient Hospital Stay: Payer: Medicare HMO

## 2022-08-16 ENCOUNTER — Ambulatory Visit: Payer: Medicare HMO | Admitting: Hematology

## 2022-08-16 ENCOUNTER — Inpatient Hospital Stay: Payer: Medicare HMO | Admitting: Hematology

## 2022-08-16 DIAGNOSIS — Z5111 Encounter for antineoplastic chemotherapy: Secondary | ICD-10-CM | POA: Diagnosis not present

## 2022-08-16 DIAGNOSIS — R0602 Shortness of breath: Secondary | ICD-10-CM | POA: Diagnosis not present

## 2022-08-16 DIAGNOSIS — K5900 Constipation, unspecified: Secondary | ICD-10-CM | POA: Diagnosis not present

## 2022-08-16 DIAGNOSIS — D696 Thrombocytopenia, unspecified: Secondary | ICD-10-CM | POA: Diagnosis not present

## 2022-08-16 DIAGNOSIS — C9310 Chronic myelomonocytic leukemia not having achieved remission: Secondary | ICD-10-CM

## 2022-08-16 DIAGNOSIS — R053 Chronic cough: Secondary | ICD-10-CM | POA: Diagnosis not present

## 2022-08-16 DIAGNOSIS — G479 Sleep disorder, unspecified: Secondary | ICD-10-CM | POA: Diagnosis not present

## 2022-08-16 DIAGNOSIS — J45909 Unspecified asthma, uncomplicated: Secondary | ICD-10-CM | POA: Diagnosis not present

## 2022-08-16 DIAGNOSIS — K219 Gastro-esophageal reflux disease without esophagitis: Secondary | ICD-10-CM | POA: Diagnosis not present

## 2022-08-16 LAB — CBC WITH DIFFERENTIAL/PLATELET
Abs Immature Granulocytes: 0.1 10*3/uL — ABNORMAL HIGH (ref 0.00–0.07)
Band Neutrophils: 2 %
Basophils Absolute: 0 10*3/uL (ref 0.0–0.1)
Basophils Relative: 0 %
Eosinophils Absolute: 0 10*3/uL (ref 0.0–0.5)
Eosinophils Relative: 1 %
HCT: 29 % — ABNORMAL LOW (ref 36.0–46.0)
Hemoglobin: 8.9 g/dL — ABNORMAL LOW (ref 12.0–15.0)
Lymphocytes Relative: 39 %
Lymphs Abs: 1.2 10*3/uL (ref 0.7–4.0)
MCH: 32.8 pg (ref 26.0–34.0)
MCHC: 30.7 g/dL (ref 30.0–36.0)
MCV: 107 fL — ABNORMAL HIGH (ref 80.0–100.0)
Metamyelocytes Relative: 2 %
Monocytes Absolute: 0.4 10*3/uL (ref 0.1–1.0)
Monocytes Relative: 13 %
Neutro Abs: 1.4 10*3/uL — ABNORMAL LOW (ref 1.7–7.7)
Neutrophils Relative %: 43 %
Platelets: 93 10*3/uL — ABNORMAL LOW (ref 150–400)
RBC: 2.71 MIL/uL — ABNORMAL LOW (ref 3.87–5.11)
RDW: 17.1 % — ABNORMAL HIGH (ref 11.5–15.5)
WBC: 3 10*3/uL — ABNORMAL LOW (ref 4.0–10.5)
nRBC: 0 % (ref 0.0–0.2)

## 2022-08-16 LAB — COMPREHENSIVE METABOLIC PANEL
ALT: 11 U/L (ref 0–44)
AST: 17 U/L (ref 15–41)
Albumin: 4.2 g/dL (ref 3.5–5.0)
Alkaline Phosphatase: 37 U/L — ABNORMAL LOW (ref 38–126)
Anion gap: 7 (ref 5–15)
BUN: 19 mg/dL (ref 8–23)
CO2: 24 mmol/L (ref 22–32)
Calcium: 9.4 mg/dL (ref 8.9–10.3)
Chloride: 107 mmol/L (ref 98–111)
Creatinine, Ser: 1.05 mg/dL — ABNORMAL HIGH (ref 0.44–1.00)
GFR, Estimated: 57 mL/min — ABNORMAL LOW (ref >60)
Glucose, Bld: 109 mg/dL — ABNORMAL HIGH (ref 70–99)
Potassium: 3.7 mmol/L (ref 3.5–5.1)
Sodium: 138 mmol/L (ref 135–145)
Total Bilirubin: 1.2 mg/dL (ref 0.3–1.2)
Total Protein: 7 g/dL (ref 6.5–8.1)

## 2022-08-16 LAB — MAGNESIUM: Magnesium: 2.3 mg/dL (ref 1.7–2.4)

## 2022-08-16 MED ORDER — HEPARIN SOD (PORK) LOCK FLUSH 100 UNIT/ML IV SOLN
500.0000 [IU] | Freq: Once | INTRAVENOUS | Status: AC
  Administered 2022-08-16: 500 [IU] via INTRAVENOUS

## 2022-08-16 MED ORDER — SODIUM CHLORIDE 0.9% FLUSH
10.0000 mL | Freq: Once | INTRAVENOUS | Status: AC
  Administered 2022-08-16: 10 mL via INTRAVENOUS

## 2022-08-16 NOTE — Addendum Note (Signed)
Addended by: Fayne Norrie on: 08/16/2022 11:38 AM   Modules accepted: Orders

## 2022-08-16 NOTE — Progress Notes (Signed)
Patients port flushed without difficulty.  Good blood return noted with no bruising or swelling noted at site.  Stable during access and blood draw.  Patient to remain accessed for possible fluids.

## 2022-08-16 NOTE — Progress Notes (Signed)
[place],  [address]   CLINIC:  Medical Oncology/Hematology  PCP:  Asencion Noble, [place] [address] [phone]   REASON FOR VISIT:  CMML-2  PRIOR THERAPY: None.  NGS Results: CBL, MPL, SRSF2, TET2, RAD21  CURRENT THERAPY: Azacitidine 70 mg/m x 7 days every 28 days  BRIEF ONCOLOGIC HISTORY:  Oncology History  CMML (chronic myelomonocytic leukemia) ([place])  06/03/2022 Initial Diagnosis   CMML (chronic myelomonocytic leukemia) ([place])   08/02/2022 -  Chemotherapy   Patient is on Treatment Plan : MYELODYSPLASIA  Azacitidine IVPB D1-7 q28d       CANCER STAGING:  Cancer Staging  No matching staging information was found for the patient.   INTERVAL HISTORY:  Ms. Chea 72 y.o. female seen for follow-up of CMML.  She started cycle 1 of azacitidine on 08/02/2022.  She reports constipation is slightly worse.  She is taking lactulose once daily.  She is also continuing Megace twice daily.  Denies any nausea or vomiting.    REVIEW OF SYSTEMS:  Review of Systems  Respiratory:  Positive for cough and shortness of breath.   Gastrointestinal:  Positive for constipation.  Psychiatric/Behavioral:  The patient is nervous/anxious.   All other systems reviewed and are negative.    PAST MEDICAL/SURGICAL HISTORY:  Past Medical History:  Diagnosis Date   Anxiety    Asthma    had 1 episode 1 year ago-no more problem   CHF (congestive heart failure) (HCC)    swelling of feet & ankles   CMML (chronic myelomonocytic leukemia) (HCC) 06/03/2022   Cough    Depression    OCD   GERD (gastroesophageal reflux disease)    severe   OCD (obsessive compulsive disorder)    Port-A-Cath in place 07/26/2022   Shortness of breath    with exertion   Yeast infection 04/09/2014   Past Surgical History:  Procedure Laterality Date   CARPAL TUNNEL RELEASE  03/22/2012   Procedure: CARPAL TUNNEL RELEASE;  Surgeon: Wynonia Sours, MD;  Location: [facility];  Service: Orthopedics;  Laterality: Right;  right carpal tunnel release   COLONOSCOPY  06/19/2012   Dr. Rourk:normal rectum and colon    ESOPHAGOGASTRODUODENOSCOPY N/A 12/26/2015   Dr. Gala Romney: normal    IR IMAGING GUIDED PORT INSERTION  07/30/2022   TONSILLECTOMY     TUBAL LIGATION       SOCIAL HISTORY:  Social History   Socioeconomic History   Marital status: Widowed    Spouse name: Not on file   Number of children: 1   Years of education: Not on file   Highest education level: Not on file  Occupational History   Occupation: retired  Tobacco Use   Smoking status: Never    Passive exposure: Yes   Smokeless tobacco: Never  Vaping Use   Vaping Use: Never used  Substance and Sexual Activity   Alcohol use: No   Drug use: No   Sexual activity: Yes    Birth control/protection: Surgical    Comment: tubal  Other Topics Concern   Not on file  Social History Narrative   Not on file   Social Determinants of Health   Financial Resource Strain: Not on file  Food Insecurity: Not on file  Transportation Needs: Not on file  Physical Activity: Not on file  Stress: Not on file  Social Connections: Not on file  Intimate Partner Violence: Not on file  FAMILY HISTORY:  Family History  Problem Relation Age of Onset   Asthma Mother    Macular degeneration Mother    Hypertension Mother    Alzheimer's disease Father    Other Sister        blood clots   Other Son        enlarged spleen   Diabetes Maternal Grandfather    Diabetes Paternal Grandfather    Colon cancer Neg Hx     CURRENT MEDICATIONS:  Outpatient Encounter Medications as of 08/16/2022  Medication Sig   albuterol (VENTOLIN HFA) 108 (90 Base) MCG/ACT inhaler INHALE 2 PUFFS BY MOUTH EVERY 6 HOURS AS NEEDED FOR SHORTNESS OF BREATH OR WHEEZING.   allopurinol (ZYLOPRIM) 300 MG tablet Take 1 tablet (300 mg total) by mouth daily.   azaCITIDine 5 mg/2 mLs in lactated ringers  infusion Inject into the vein daily. Days 1-7 every 28 days   azelastine (ASTELIN) 0.1 % nasal spray SPRAY 1 TO 2 SPRAYS PER NOSTRIL TWICE DAILY.   BREZTRI AEROSPHERE 160-9-4.8 MCG/ACT AERO INHALE 2 PUFFS BY MOUTH TWICE DAILY   celecoxib (CELEBREX) 100 MG capsule Take 2 capsules (200 mg total) by mouth 2 (two) times daily.   cetirizine (ZYRTEC) 5 MG tablet Take 1 tablet (5 mg total) by mouth daily.   cimetidine (TAGAMET) 800 MG tablet TAKE 1/2 TABLET BY MOUTH ONCE OR TWICE DAILY AS NEEDED   dexlansoprazole (DEXILANT) 60 MG capsule Take 1 capsule (60 mg total) by mouth daily.   doxycycline (VIBRAMYCIN) 100 MG capsule Take 100 mg by mouth 2 (two) times daily.   EPINEPHrine 0.3 mg/0.3 mL IJ SOAJ injection Inject 0.3 mg into the muscle as needed for anaphylaxis.   famotidine (PEPCID) 20 MG tablet Take 20 mg by mouth 2 (two) times daily.   FLUoxetine (PROZAC) 20 MG capsule Take 1 capsule by mouth daily.   fluticasone (FLONASE) 50 MCG/ACT nasal spray Place 2 sprays into both nostrils 2 (two) times daily.   Fluticasone-Umeclidin-Vilant (TRELEGY ELLIPTA) 200-62.5-25 MCG/ACT AEPB Inhale 1 puff into the lungs daily.   folic acid (FOLVITE) 1 MG tablet Take 1 tablet (1 mg total) by mouth daily.   gabapentin (NEURONTIN) 300 MG capsule    HYDROcodone bit-homatropine (HYCODAN) 5-1.5 MG/5ML syrup TAKE ONE TEASPOONFUL (5 ml) BY MOUTH EVERY 4 HOURS   ipratropium (ATROVENT) 0.06 % nasal spray USE 2 SPRAYS IN EACH NOSTRIL THREE TIMES A DAY AS NEEDED.   Lactulose 20 GM/30ML SOLN Take 30 mLs (20 g total) by mouth daily as needed. Take 30 ml by mouth every 3 hours until bowel movement then take daily as needed for constipation   lidocaine-prilocaine (EMLA) cream Apply a quarter sized amount to port a cath site and cover with plastic wrap one hour prior to infusion appointments   megestrol (MEGACE) 400 MG/10ML suspension Take 10 mLs (400 mg total) by mouth 2 (two) times daily.   montelukast (SINGULAIR) 10 MG tablet TAKE  ONE TABLET BY MOUTH AT BEDTIME.   naproxen (NAPROSYN) 500 MG tablet Take 500 mg by mouth 2 (two) times daily.   NON FORMULARY [facility]  Antifungal (nail)-#1   NON FORMULARY [facility]  Anti-fungal (nail)-#1   omeprazole (PRILOSEC) 20 MG capsule Take 1 capsule (20 mg total) by mouth daily.   oxyCODONE-acetaminophen (PERCOCET/ROXICET) 5-325 MG tablet Take 1 tablet by mouth every 6 (six) hours as needed for severe pain.   pantoprazole (PROTONIX) 40 MG tablet Take 40 mg by mouth daily.   prochlorperazine (COMPAZINE) 10  MG tablet Take 1 tablet (10 mg total) by mouth every 6 (six) hours as needed for nausea or vomiting.   prochlorperazine (COMPAZINE) 10 MG tablet Take 1 tablet (10 mg total) by mouth every 6 (six) hours as needed for nausea or vomiting.   Respiratory Therapy Supplies (FLUTTER) DEVI Use as directed   SF 5000 PLUS 1.1 % CREA dental cream    Spacer/Aero-Holding Chambers (AEROCHAMBER PLUS WITH MASK) inhaler 1 each by Other route See admin instructions. Use with inhaler as instructed.   traZODone (DESYREL) 50 MG tablet Take 1 tablet (50 mg total) by mouth at bedtime.   triamcinolone cream (KENALOG) 0.1 %    Facility-Administered Encounter Medications as of 08/16/2022  Medication   [COMPLETED] sodium chloride flush (NS) 0.9 % injection 10 mL   tezepelumab-ekko (TEZSPIRE) 210 MG/1.91ML syringe 210 mg    ALLERGIES:  Allergies  Allergen Reactions   Levonorgestrel-Ethinyl Estrad Cough   Sulfa Antibiotics Other (See Comments)    Unknown- pt unsure of reaction; believes it may be nausea   Sulfamethoxazole-Trimethoprim Other (See Comments)   Cephalosporins Other (See Comments)    Other Reaction: Toxicity     PHYSICAL EXAM:  ECOG Performance status: 1  There were no vitals filed for this visit.   There were no vitals filed for this visit.   Physical Exam Vitals reviewed.  Constitutional:      Appearance: Normal appearance.  Cardiovascular:     Rate  and Rhythm: Normal rate and regular rhythm.     Heart sounds: Normal heart sounds.  Pulmonary:     Breath sounds: Normal breath sounds.  Abdominal:     Palpations: Abdomen is soft. There is no mass.  Neurological:     Mental Status: She is alert.  Psychiatric:        Mood and Affect: Mood normal.        Behavior: Behavior normal.      LABORATORY DATA:  I have reviewed the labs as listed.  CBC    Component Value Date/Time   WBC 3.0 (L) 08/16/2022 0956   RBC 2.71 (L) 08/16/2022 0956   HGB 8.9 (L) 08/16/2022 0956   HGB 13.1 07/23/2020 1112   HCT 29.0 (L) 08/16/2022 0956   HCT 40.7 07/23/2020 1112   PLT 93 (L) 08/16/2022 0956   MCV 107.0 (H) 08/16/2022 0956   MCV 95 07/23/2020 1112   MCH 32.8 08/16/2022 0956   MCHC 30.7 08/16/2022 0956   RDW 17.1 (H) 08/16/2022 0956   RDW 14.2 07/23/2020 1112   LYMPHSABS 1.2 08/16/2022 0956   LYMPHSABS 0.7 07/23/2020 1112   MONOABS 0.4 08/16/2022 0956   EOSABS 0.0 08/16/2022 0956   EOSABS 0.1 07/23/2020 1112   BASOSABS 0.0 08/16/2022 0956   BASOSABS 0.0 07/23/2020 1112      Latest Ref Rng & Units 08/16/2022    9:56 AM 08/09/2022    8:18 AM 08/02/2022    1:07 PM  CMP  Glucose 70 - 99 mg/dL 109  108  101   BUN 8 - 23 mg/dL '19  23  22   '$ Creatinine 0.44 - 1.00 mg/dL 1.05  1.15  1.16   Sodium 135 - 145 mmol/L 138  137  135   Potassium 3.5 - 5.1 mmol/L 3.7  3.9  4.1   Chloride 98 - 111 mmol/L 107  105  105   CO2 22 - 32 mmol/L '24  24  24   '$ Calcium 8.9 - 10.3 mg/dL 9.4  9.2  9.0   Total Protein 6.5 - 8.1 g/dL 7.0  7.0  7.2   Total Bilirubin 0.3 - 1.2 mg/dL 1.2  1.3  0.5   Alkaline Phos 38 - 126 U/L 37  44  46   AST 15 - 41 U/L '17  19  19   '$ ALT 0 - 44 U/L '11  14  12     '$ DIAGNOSTIC IMAGING:  I have independently reviewed the scans and discussed with the patient.  ASSESSMENT: 1.  Macrocytic anemia and thrombocytopenia: - Patient seen at the request of Dr. Willey Blade for abnormal CBC. - 04/17/2022: WBC 15.8, Hb-9.7, PLT-128 - 03/24/2022:  WBC-8.1 (N-43%, L-17%, M-19%, B-2%), Hb-9.6, MCV-99, PLT-115 - Smear review: 13% metamyelocytes, 2% myelocytes, 2% blasts,  - 10/19/2021: WBC-5.3 (N-61, L-21, M-10%), Hb-11, MCV-96, PLT-97 - BMBX (05/25/2022): Hypercellular bone marrow with myeloid hyperplasia with dysgranulopoiesis, erythroid hypoplasia and megakaryocytic hyperplasia with dyspoiesis.  Chromosome analysis was 24, XX.  Bone marrow blasts less than 5%.  Peripheral blood blasts less than 2%. - Mayo molecular model risk stratification: Intermediate 2 risk with at least 2 points (decreased hemoglobin less than 10, circulating immature cells).  Intermediate 2 risk with median OS 31 months. - Serum EPO level 41. - NGS: Positive for CBL, MPL, SRSF2, TET 2, RAD21 - PET scan (07/08/2022): Splenomegaly (volume 510 mm) with normal metabolic activity.  Uniform increase in marrow metabolic activity related to patient's anemia.  No lymphadenopathy. - BMBX (07/16/2022): Hypercellular bone marrow (95-100%) with myeloid hyperplasia, atypical monocytes, increased blasts (16% overall).  Atypical monocytosis present 23% of total events, expressing HLA-DR, CD38, CD4 dim, CD11C, CD13, CD14, CD36, CD64 with aberrant coexpression of CD56 and CD7.   2.  Social/family history: - She lives at home by herself.  Son lives next door. - Does part-time work at [employer].  Worked in [employer] for 30 years prior to retirement.  Non-smoker. - Maternal aunt had tumor in the breast, patient not certain if it is cancer.  2 maternal first cousins had lymphoma.  Mother had stomach cancer.   PLAN:  1.  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 - She has tolerated it fairly well. - Labs today shows white count 3.0, ANC 1.4.  Platelet count 93.  Hemoglobin 8.9.  LFTs normal. - RTC 2 weeks to start cycle 2.  Will schedule for bone marrow biopsy at [facility] after 2 cycles.  If suboptimal response, will add  venetoclax. - Patient has posaconazole at home.  She was told not to start it yet.  2.  Folate deficiency: - Continue folic acid 1 mg tablet daily.  3.  Weight loss: - Continue Megace twice daily.  4.  TLS prophylaxis: - Continue allopurinol 300 mg daily.  5.  Constipation: - She was told to start taking stool softener.  She will increase lactulose to twice daily if needed.   Orders placed this encounter:  No orders of the defined types were placed in this encounter.      Derek Jack, MD [place] [phone]

## 2022-08-16 NOTE — Patient Instructions (Signed)
[place] at [facility] Discharge Instructions   You were seen and examined today by Dr. Delton Coombes.  He reviewed the results of your lab work which are normal/stable.   Increase lactulose to twice a day to help with constipation. Also take a stool softener daily along with this to help improve the constipation.   We will see you back in 2 weeks.    Thank you for choosing [place] at [facility] to provide your oncology and hematology care.  To afford each patient quality time with our provider, please arrive at least 15 minutes before your scheduled appointment time.   If you have a lab appointment with the [facility] please come in thru the Main Entrance and check in at the main information desk.  You need to re-schedule your appointment should you arrive 10 or more minutes late.  We strive to give you quality time with our providers, and arriving late affects you and other patients whose appointments are after yours.  Also, if you no show three or more times for appointments you may be dismissed from the clinic at the providers discretion.     Again, thank you for choosing [facility].  Our hope is that these requests will decrease the amount of time that you wait before being seen by our physicians.       _____________________________________________________________  Should you have questions after your visit to [facility], please contact our office at [phone] and follow the prompts.  Our office hours are 8:00 a.m. and 4:30 p.m. Monday - Friday.  Please note that voicemails left after 4:00 p.m. may not be returned until the following business day.  We are closed weekends and major holidays.  You do have access to a nurse 24-7, just call the main number to the clinic [phone] and do not press any options, hold on the line and a nurse will answer the phone.    For prescription refill requests, have  your pharmacy contact our office and allow 72 hours.    Due to Covid, you will need to wear a mask upon entering the hospital. If you do not have a mask, a mask will be given to you at the Main Entrance upon arrival. For doctor visits, patients may have 1 support person age 74 or older with them. For treatment visits, patients can not have anyone with them due to social distancing guidelines and our immunocompromised population.

## 2022-08-23 ENCOUNTER — Ambulatory Visit (INDEPENDENT_AMBULATORY_CARE_PROVIDER_SITE_OTHER): Payer: Medicare HMO

## 2022-08-23 DIAGNOSIS — J4550 Severe persistent asthma, uncomplicated: Secondary | ICD-10-CM | POA: Diagnosis not present

## 2022-08-24 ENCOUNTER — Other Ambulatory Visit: Payer: Self-pay

## 2022-08-26 ENCOUNTER — Inpatient Hospital Stay: Payer: Medicare HMO | Attending: Hematology | Admitting: Licensed Clinical Social Worker

## 2022-08-26 ENCOUNTER — Encounter: Payer: Self-pay | Admitting: Licensed Clinical Social Worker

## 2022-08-26 DIAGNOSIS — Z5111 Encounter for antineoplastic chemotherapy: Secondary | ICD-10-CM | POA: Insufficient documentation

## 2022-08-26 DIAGNOSIS — Z825 Family history of asthma and other chronic lower respiratory diseases: Secondary | ICD-10-CM | POA: Insufficient documentation

## 2022-08-26 DIAGNOSIS — K5900 Constipation, unspecified: Secondary | ICD-10-CM | POA: Insufficient documentation

## 2022-08-26 DIAGNOSIS — Z8249 Family history of ischemic heart disease and other diseases of the circulatory system: Secondary | ICD-10-CM | POA: Insufficient documentation

## 2022-08-26 DIAGNOSIS — Z79899 Other long term (current) drug therapy: Secondary | ICD-10-CM | POA: Insufficient documentation

## 2022-08-26 DIAGNOSIS — R634 Abnormal weight loss: Secondary | ICD-10-CM | POA: Insufficient documentation

## 2022-08-26 DIAGNOSIS — D539 Nutritional anemia, unspecified: Secondary | ICD-10-CM | POA: Insufficient documentation

## 2022-08-26 DIAGNOSIS — D696 Thrombocytopenia, unspecified: Secondary | ICD-10-CM | POA: Insufficient documentation

## 2022-08-26 DIAGNOSIS — Z833 Family history of diabetes mellitus: Secondary | ICD-10-CM | POA: Insufficient documentation

## 2022-08-26 DIAGNOSIS — E538 Deficiency of other specified B group vitamins: Secondary | ICD-10-CM | POA: Insufficient documentation

## 2022-08-26 DIAGNOSIS — Z807 Family history of other malignant neoplasms of lymphoid, hematopoietic and related tissues: Secondary | ICD-10-CM | POA: Diagnosis not present

## 2022-08-26 DIAGNOSIS — R161 Splenomegaly, not elsewhere classified: Secondary | ICD-10-CM | POA: Insufficient documentation

## 2022-08-26 DIAGNOSIS — J45909 Unspecified asthma, uncomplicated: Secondary | ICD-10-CM | POA: Insufficient documentation

## 2022-08-26 DIAGNOSIS — Z9851 Tubal ligation status: Secondary | ICD-10-CM | POA: Insufficient documentation

## 2022-08-26 DIAGNOSIS — Z800 Family history of malignant neoplasm of digestive organs: Secondary | ICD-10-CM | POA: Insufficient documentation

## 2022-08-26 DIAGNOSIS — Z818 Family history of other mental and behavioral disorders: Secondary | ICD-10-CM | POA: Insufficient documentation

## 2022-08-26 DIAGNOSIS — C9310 Chronic myelomonocytic leukemia not having achieved remission: Secondary | ICD-10-CM | POA: Diagnosis not present

## 2022-08-26 DIAGNOSIS — Z83518 Family history of other specified eye disorder: Secondary | ICD-10-CM | POA: Insufficient documentation

## 2022-08-26 DIAGNOSIS — Z882 Allergy status to sulfonamides status: Secondary | ICD-10-CM | POA: Insufficient documentation

## 2022-08-26 NOTE — Progress Notes (Signed)
REFERRING PROVIDER: Derek Jack, MD [address] [place],  [place] [address]  PRIMARY PROVIDER:  Asencion Noble, MD  PRIMARY REASON FOR VISIT:  1. Chronic myelomonocytic leukemia not having achieved remission ([place])   2. Family history of stomach cancer    I connected with [name] on 08/26/2022 at 2:00 PM EDT by MyChart video conference and verified that I am speaking with the correct person using two identifiers.    [name] location: [place] Provider location: [place]:   [name], a 72 y.o. female, was seen for a [place] cancer genetics consultation at the request of Dr. Delton Coombes due to a personal and family history of cancer.  [name] presents to clinic today to discuss the possibility of a hereditary predisposition to cancer, genetic testing, and to further clarify her future cancer risks, as well as potential cancer risks for family members.   CANCER HISTORY:  In 2023, at the age of 48, [name] was diagnosed with chronic myelomonocytic leukemia (CMML). The treatment plan currently includes chemotherapy. She had NGS testing that showed a CBL mutation called [id number].   Oncology History  CMML (chronic myelomonocytic leukemia) ([place])  06/03/2022 Initial Diagnosis   CMML (chronic myelomonocytic leukemia) ([place])   08/02/2022 -  Chemotherapy   [name] is on Treatment Plan : MYELODYSPLASIA  Azacitidine IVPB D1-7 q28d       RISK FACTORS:  Menarche was at age 95-13.  First live birth at age 72.  Ovaries intact: yes.  Hysterectomy: no.  Menopausal status: postmenopausal.  HRT use: 0 years. Colonoscopy: yes; normal. Mammogram within the last year: yes. Number of breast biopsies: 1. Any excessive radiation exposure in the past: no  Past Medical History:  Diagnosis Date   Anxiety    Asthma    had 1 episode 1 year ago-no more problem   CHF (congestive heart failure) (HCC)    swelling of feet & ankles   CMML (chronic  myelomonocytic leukemia) (HCC) 06/03/2022   Cough    Depression    OCD   GERD (gastroesophageal reflux disease)    severe   OCD (obsessive compulsive disorder)    Port-A-Cath in place 07/26/2022   Shortness of breath    with exertion   Yeast infection 04/09/2014    Past Surgical History:  Procedure Laterality Date   CARPAL TUNNEL RELEASE  03/22/2012   Procedure: CARPAL TUNNEL RELEASE;  Surgeon: Wynonia Sours, MD;  Location: [facility];  Service: Orthopedics;  Laterality: Right;  right carpal tunnel release   COLONOSCOPY  06/19/2012   Dr. Rourk:normal rectum and colon    ESOPHAGOGASTRODUODENOSCOPY N/A 12/26/2015   Dr. Gala Romney: normal    IR IMAGING GUIDED PORT INSERTION  07/30/2022   TONSILLECTOMY     TUBAL LIGATION      FAMILY HISTORY:  We obtained a detailed, 4-generation family history.  Significant diagnoses are listed below: Family History  Problem Relation Age of Onset   Asthma Mother    Macular degeneration Mother    Hypertension Mother    Alzheimer's disease Father    Other Sister        blood clots   Other Son        enlarged spleen   Diabetes Maternal Grandfather    Diabetes Paternal Grandfather    Colon cancer Neg Hx    [name] has 1 son, 56, no history of cancer. She has 1 sister, 48, no cancers.  [name] mother died at 48 of stomach cancer.  [name] has 2 maternal cousins who had lymphoma in their 56s. No other known cancers on either side of the family.  [name] is unaware of previous family history of genetic testing for hereditary cancer risks. There is no reported Ashkenazi Jewish ancestry. There is no known consanguinity.    GENETIC COUNSELING ASSESSMENT: [name] is a 72 y.o. female with a personal history of CMML which is not particularly suggestive of a hereditary cancer syndrome and predisposition to cancer. We, therefore, discussed and recommended the following at today's visit.   DISCUSSION: We discussed that approximately 10%  of cancer is hereditary. We talked about her NGS testing that showed a CBL mutation, and that it's possible this is a germline variant (meaning she was born with it), and it's also possible this mutation is only part of the cancer. Since she has CMML, we would need to do a skin punch biopsy to do testing. Additionally, the particular CBL mutation found on NGS is classified as an uncertain result through [facility]. Therefore, if we were to test her, even if she has the CBL mutation from the NGS panel, it would be reported out as a VUS. We did discuss the CBL gene, noting that it is associated with JMML and/or Noonan-like syndrome. She does not report any history or family history of signs/symptoms of Noonan, and no one in her family has had JMML that she is aware of.    We reviewed the characteristics, features and inheritance patterns of hereditary cancer syndromes. We also discussed genetic testing, including the appropriate family members to test, the process of testing, insurance coverage and turn-around-time for results. We discussed the implications of a negative, positive and/or variant of uncertain significant result. Since there seems to little clinical utility to doing the test, and since it would require skin punch biopsy, we did not recommend testing for [name] at this time. She also doesn't meet any medical criteria for testing, so she would likely owe at least $250 for the panel, and then there could be a cost associated with culturing for the skin punch biopsy. She has our contact information if she changes her mind and does want to pursue testing.  PLAN: After considering the risks, benefits, and limitations, [name] did not wish to pursue genetic testing at today's visit. We understand this decision and remain available to coordinate genetic testing at any time in the future. We, therefore, recommend [name] continue to follow the cancer screening guidelines given by her primary healthcare  provider.  [name] questions were answered to her satisfaction today. Our contact information was provided should additional questions or concerns arise. Thank you for the referral and allowing [place] to share in the care of [name].   Faith Rogue, MS, [facility] Genetic Counselor [email or web address] Phone: [phone]  The [name] was seen for a total of 25 minutes in virtual genetic counseling.  Dr. Grayland Ormond was available for discussion regarding this case.   _______________________________________________________________________ For Office Staff:  Number of people involved in session: 1 Was an Intern/ student involved with case: no

## 2022-08-27 ENCOUNTER — Other Ambulatory Visit: Payer: Self-pay

## 2022-08-27 DIAGNOSIS — C9310 Chronic myelomonocytic leukemia not having achieved remission: Secondary | ICD-10-CM

## 2022-08-27 DIAGNOSIS — Z95828 Presence of other vascular implants and grafts: Secondary | ICD-10-CM

## 2022-08-30 ENCOUNTER — Inpatient Hospital Stay (HOSPITAL_BASED_OUTPATIENT_CLINIC_OR_DEPARTMENT_OTHER): Payer: Medicare HMO | Admitting: Hematology

## 2022-08-30 ENCOUNTER — Inpatient Hospital Stay: Payer: Medicare HMO

## 2022-08-30 VITALS — BP 99/63 | HR 74 | Temp 99.1°F | Resp 16 | Wt 145.6 lb

## 2022-08-30 DIAGNOSIS — Z882 Allergy status to sulfonamides status: Secondary | ICD-10-CM | POA: Diagnosis not present

## 2022-08-30 DIAGNOSIS — Z825 Family history of asthma and other chronic lower respiratory diseases: Secondary | ICD-10-CM | POA: Diagnosis not present

## 2022-08-30 DIAGNOSIS — R161 Splenomegaly, not elsewhere classified: Secondary | ICD-10-CM | POA: Diagnosis not present

## 2022-08-30 DIAGNOSIS — C9310 Chronic myelomonocytic leukemia not having achieved remission: Secondary | ICD-10-CM

## 2022-08-30 DIAGNOSIS — Z9851 Tubal ligation status: Secondary | ICD-10-CM | POA: Diagnosis not present

## 2022-08-30 DIAGNOSIS — Z95828 Presence of other vascular implants and grafts: Secondary | ICD-10-CM | POA: Diagnosis not present

## 2022-08-30 DIAGNOSIS — Z5111 Encounter for antineoplastic chemotherapy: Secondary | ICD-10-CM | POA: Diagnosis not present

## 2022-08-30 DIAGNOSIS — Z79899 Other long term (current) drug therapy: Secondary | ICD-10-CM | POA: Diagnosis not present

## 2022-08-30 DIAGNOSIS — Z83518 Family history of other specified eye disorder: Secondary | ICD-10-CM | POA: Diagnosis not present

## 2022-08-30 DIAGNOSIS — J45909 Unspecified asthma, uncomplicated: Secondary | ICD-10-CM | POA: Diagnosis not present

## 2022-08-30 DIAGNOSIS — R634 Abnormal weight loss: Secondary | ICD-10-CM | POA: Diagnosis not present

## 2022-08-30 DIAGNOSIS — D539 Nutritional anemia, unspecified: Secondary | ICD-10-CM | POA: Diagnosis not present

## 2022-08-30 DIAGNOSIS — D696 Thrombocytopenia, unspecified: Secondary | ICD-10-CM | POA: Diagnosis not present

## 2022-08-30 DIAGNOSIS — E538 Deficiency of other specified B group vitamins: Secondary | ICD-10-CM | POA: Diagnosis not present

## 2022-08-30 DIAGNOSIS — Z833 Family history of diabetes mellitus: Secondary | ICD-10-CM | POA: Diagnosis not present

## 2022-08-30 DIAGNOSIS — Z8249 Family history of ischemic heart disease and other diseases of the circulatory system: Secondary | ICD-10-CM | POA: Diagnosis not present

## 2022-08-30 DIAGNOSIS — Z800 Family history of malignant neoplasm of digestive organs: Secondary | ICD-10-CM | POA: Diagnosis not present

## 2022-08-30 DIAGNOSIS — K5900 Constipation, unspecified: Secondary | ICD-10-CM | POA: Diagnosis not present

## 2022-08-30 DIAGNOSIS — Z807 Family history of other malignant neoplasms of lymphoid, hematopoietic and related tissues: Secondary | ICD-10-CM | POA: Diagnosis not present

## 2022-08-30 DIAGNOSIS — Z818 Family history of other mental and behavioral disorders: Secondary | ICD-10-CM | POA: Diagnosis not present

## 2022-08-30 LAB — COMPREHENSIVE METABOLIC PANEL
ALT: 13 U/L (ref 0–44)
AST: 22 U/L (ref 15–41)
Albumin: 3.9 g/dL (ref 3.5–5.0)
Alkaline Phosphatase: 36 U/L — ABNORMAL LOW (ref 38–126)
Anion gap: 6 (ref 5–15)
BUN: 20 mg/dL (ref 8–23)
CO2: 24 mmol/L (ref 22–32)
Calcium: 9.1 mg/dL (ref 8.9–10.3)
Chloride: 106 mmol/L (ref 98–111)
Creatinine, Ser: 1.04 mg/dL — ABNORMAL HIGH (ref 0.44–1.00)
GFR, Estimated: 57 mL/min — ABNORMAL LOW (ref >60)
Glucose, Bld: 94 mg/dL (ref 70–99)
Potassium: 4.1 mmol/L (ref 3.5–5.1)
Sodium: 136 mmol/L (ref 135–145)
Total Bilirubin: 0.7 mg/dL (ref 0.3–1.2)
Total Protein: 6.6 g/dL (ref 6.5–8.1)

## 2022-08-30 LAB — CBC WITH DIFFERENTIAL/PLATELET
Abs Immature Granulocytes: 0.07 10*3/uL (ref 0.00–0.07)
Basophils Absolute: 0.1 10*3/uL (ref 0.0–0.1)
Basophils Relative: 2 %
Eosinophils Absolute: 0 10*3/uL (ref 0.0–0.5)
Eosinophils Relative: 1 %
HCT: 30.5 % — ABNORMAL LOW (ref 36.0–46.0)
Hemoglobin: 9.3 g/dL — ABNORMAL LOW (ref 12.0–15.0)
Immature Granulocytes: 2 %
Lymphocytes Relative: 24 %
Lymphs Abs: 0.7 10*3/uL (ref 0.7–4.0)
MCH: 32.5 pg (ref 26.0–34.0)
MCHC: 30.5 g/dL (ref 30.0–36.0)
MCV: 106.6 fL — ABNORMAL HIGH (ref 80.0–100.0)
Monocytes Absolute: 0.4 10*3/uL (ref 0.1–1.0)
Monocytes Relative: 14 %
Neutro Abs: 1.7 10*3/uL (ref 1.7–7.7)
Neutrophils Relative %: 57 %
Platelets: 225 10*3/uL (ref 150–400)
RBC: 2.86 MIL/uL — ABNORMAL LOW (ref 3.87–5.11)
RDW: 16.6 % — ABNORMAL HIGH (ref 11.5–15.5)
WBC: 3 10*3/uL — ABNORMAL LOW (ref 4.0–10.5)
nRBC: 0 % (ref 0.0–0.2)

## 2022-08-30 LAB — MAGNESIUM: Magnesium: 2.3 mg/dL (ref 1.7–2.4)

## 2022-08-30 LAB — LACTATE DEHYDROGENASE: LDH: 305 U/L — ABNORMAL HIGH (ref 98–192)

## 2022-08-30 MED ORDER — SODIUM CHLORIDE 0.9% FLUSH: 10.0000 mL | INTRAVENOUS | Status: DC | PRN

## 2022-08-30 MED ORDER — SODIUM CHLORIDE 0.9 % IV SOLN
75.0000 mg/m2 | Freq: Once | INTRAVENOUS | Status: AC
  Administered 2022-08-30: 126 mg via INTRAVENOUS
  Filled 2022-08-30: qty 12.6

## 2022-08-30 MED ORDER — HEPARIN SOD (PORK) LOCK FLUSH 100 UNIT/ML IV SOLN
500.0000 [IU] | Freq: Once | INTRAVENOUS | Status: AC
  Administered 2022-08-30: 500 [IU] via INTRAVENOUS

## 2022-08-30 MED ORDER — SODIUM CHLORIDE 0.9 % IV SOLN: Freq: Once | INTRAVENOUS | Status: AC

## 2022-08-30 MED ORDER — PALONOSETRON HCL INJECTION 0.25 MG/5ML
0.2500 mg | Freq: Once | INTRAVENOUS | Status: AC
  Administered 2022-08-30: 0.25 mg via INTRAVENOUS
  Filled 2022-08-30: qty 5

## 2022-08-30 MED ORDER — SODIUM CHLORIDE 0.9% FLUSH
10.0000 mL | Freq: Once | INTRAVENOUS | Status: AC
  Administered 2022-08-30: 10 mL via INTRAVENOUS

## 2022-08-30 NOTE — Progress Notes (Signed)
[facility] [address], [place] [address]    Clinic Day:  08/30/2022  Referring physician: Asencion Noble, MD  Patient Care Team: Asencion Noble, MD as PCP - General (Internal Medicine) Gala Romney Cristopher Estimable, MD as Attending Physician (Gastroenterology) [name] Stain, MD as Consulting Physician (Pulmonary Disease) Derek Jack, MD as Medical Oncologist (Hematology)   ASSESSMENT & PLAN:   Assessment: 1.  Macrocytic anemia and thrombocytopenia: - Patient seen at the request of Dr. Willey Blade for abnormal CBC. - 04/17/2022: WBC 15.8, Hb-9.7, PLT-128 - 03/24/2022: WBC-8.1 (N-43%, L-17%, M-19%, B-2%), Hb-9.6, MCV-99, PLT-115 - Smear review: 13% metamyelocytes, 2% myelocytes, 2% blasts,  - 10/19/2021: WBC-5.3 (N-61, L-21, M-10%), Hb-11, MCV-96, PLT-97 - BMBX (05/25/2022): Hypercellular bone marrow with myeloid hyperplasia with dysgranulopoiesis, erythroid hypoplasia and megakaryocytic hyperplasia with dyspoiesis.  Chromosome analysis was 54, XX.  Bone marrow blasts less than 5%.  Peripheral blood blasts less than 2%. - Mayo molecular model risk stratification: Intermediate 2 risk with at least 2 points (decreased hemoglobin less than 10, circulating immature cells).  Intermediate 2 risk with median OS 31 months. - Serum EPO level 41. - NGS: Positive for CBL, MPL, SRSF2, TET 2, RAD21 - PET scan (07/08/2022): Splenomegaly (volume 510 mm) with normal metabolic activity.  Uniform increase in marrow metabolic activity related to patient's anemia.  No lymphadenopathy. - BMBX (07/16/2022): Hypercellular bone marrow (95-100%) with myeloid hyperplasia, atypical monocytes, increased blasts (16% overall).  Atypical monocytosis present 23% of total events, expressing HLA-DR, CD38, CD4 dim, CD11C, CD13, CD14, CD36, CD64 with aberrant coexpression of CD56 and CD7. - Cycle 1 of azacitidine on 08/02/2022, cycle 2 on 08/30/2022.   2.  Social/family history: - She lives at home by herself.  Son  lives next door. - Does part-time work at [employer].  Worked in [place] for 30 years prior to retirement.  Non-smoker. - Maternal aunt had tumor in the breast, patient not certain if it is cancer.  2 maternal first cousins had lymphoma.  Mother had stomach cancer.  Plan: 1.  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 - Reviewed labs today which showed LDH is 305, down from 420. - CBC shows white count 3.0, platelet count 225, hemoglobin 9.3.  ANC is 1.7.  LFTs are normal. - Proceed with cycle 2 without any dose modifications, days 1 through 7. - I have reached out to Dr. Florene Glen at [facility].  This patient will be arranged for bone marrow biopsy in 3 weeks and see him. - I will see her back in 4 weeks for follow-up.   2.  Folate deficiency: - Continue folic acid 1 mg tablet daily.   3.  Weight loss: - Continue Megace twice daily.   4.  TLS prophylaxis: - Continue allopurinol 300 mg daily.   5.  Constipation: - Continue stool softener along with lactulose.  I have told her to stop taking Hycodan syrup which can worsen her constipation.  No orders of the defined types were placed in this encounter.     Beverly Gust Oliver,acting as a scribe for Derek Jack, MD.,have documented all relevant documentation on the behalf of Derek Jack, MD,as directed by  Derek Jack, MD while in the presence of Derek Jack, MD.   I, Derek Jack MD, have reviewed the above documentation for accuracy and completeness, and I agree with the above.   Derek Jack, MD   2/12/20245:45 PM  CHIEF  COMPLAINT:   Diagnosis: CMML-2    Cancer Staging  No matching staging information was found for the patient.   Prior Therapy: None  Current Therapy:  Azacitidine 70 mg/m x 7 days every 28 days    HISTORY OF PRESENT ILLNESS:   Oncology History  CMML (chronic myelomonocytic  leukemia) ([facility])  06/03/2022 Initial Diagnosis   CMML (chronic myelomonocytic leukemia) ([facility])   08/02/2022 -  Chemotherapy   Patient is on Treatment Plan : MYELODYSPLASIA  Azacitidine IVPB D1-7 q28d        INTERVAL HISTORY:   [name] is a 72 y.o. female presenting to clinic today for follow up of CMML-2. She was last seen by me on 08/16/22.  Today, she states that she is doing well overall. Her appetite level is at 75%. Her energy level is at 70-80%.Her last bowel movement was 08/26/2022. She takes Lactulose for constipation every 3hrs without relief. She takes stool softeners every day as well. She tried Mralax but disliked the taste. She reports using Hycodan for her cough, and last used it this morning. She uses her Breztri for asthma but still uses the Hycodan for flare ups. She takes Megace for her appetite. She denies any pain or ankle swelling. She has abdominal pain at times when she is constipated.  PAST MEDICAL HISTORY:   Past Medical History: Past Medical History:  Diagnosis Date   Anxiety    Asthma    had 1 episode 1 year ago-no more problem   CHF (congestive heart failure) (HCC)    swelling of feet & ankles   CMML (chronic myelomonocytic leukemia) (HCC) 06/03/2022   Cough    Depression    OCD   GERD (gastroesophageal reflux disease)    severe   OCD (obsessive compulsive disorder)    Port-A-Cath in place 07/26/2022   Shortness of breath    with exertion   Yeast infection 04/09/2014    Surgical History: Past Surgical History:  Procedure Laterality Date   CARPAL TUNNEL RELEASE  03/22/2012   Procedure: CARPAL TUNNEL RELEASE;  Surgeon: Wynonia Sours, MD;  Location: [facility];  Service: Orthopedics;  Laterality: Right;  right carpal tunnel release   COLONOSCOPY  06/19/2012   Dr. Rourk:normal rectum and colon    ESOPHAGOGASTRODUODENOSCOPY N/A 12/26/2015   Dr. Gala Romney: normal    IR IMAGING GUIDED PORT INSERTION  07/30/2022   TONSILLECTOMY     TUBAL LIGATION       Social History: Social History   Socioeconomic History   Marital status: Widowed    Spouse name: Not on file   Number of children: 1   Years of education: Not on file   Highest education level: Not on file  Occupational History   Occupation: retired  Tobacco Use   Smoking status: Never    Passive exposure: Yes   Smokeless tobacco: Never  Vaping Use   Vaping Use: Never used  Substance and Sexual Activity   Alcohol use: No   Drug use: No   Sexual activity: Yes    Birth control/protection: Surgical    Comment: tubal  Other Topics Concern   Not on file  Social History Narrative   Not on file   Social Determinants of Health   Financial Resource Strain: Not on file  Food Insecurity: Not on file  Transportation Needs: Not on file  Physical Activity: Not on file  Stress: Not on file  Social Connections: Not on file  Intimate Partner Violence: Not on file  Family History: Family History  Problem Relation Age of Onset   Asthma Mother    Macular degeneration Mother    Hypertension Mother    Stomach cancer Mother 48   Alzheimer's disease Father    Other Sister        blood clots   Diabetes Maternal Grandfather    Diabetes Paternal Grandfather    Other Son        enlarged spleen   Lymphoma Cousin        x2, both dx in their 74s   Colon cancer Neg Hx     Current Medications:  Current Outpatient Medications:    albuterol (VENTOLIN HFA) 108 (90 Base) MCG/ACT inhaler, INHALE 2 PUFFS BY MOUTH EVERY 6 HOURS AS NEEDED FOR SHORTNESS OF BREATH OR WHEEZING., Disp: 18 g, Rfl: 2   allopurinol (ZYLOPRIM) 300 MG tablet, Take 1 tablet (300 mg total) by mouth daily., Disp: 30 tablet, Rfl: 0   azaCITIDine 5 mg/2 mLs in lactated ringers infusion, Inject into the vein daily. Days 1-7 every 28 days, Disp: , Rfl:    azelastine (ASTELIN) 0.1 % nasal spray, SPRAY 1 TO 2 SPRAYS PER NOSTRIL TWICE DAILY., Disp: 30 mL, Rfl: 3   BREZTRI AEROSPHERE 160-9-4.8 MCG/ACT AERO, INHALE 2  PUFFS BY MOUTH TWICE DAILY, Disp: 10.7 g, Rfl: 11   celecoxib (CELEBREX) 100 MG capsule, Take 2 capsules (200 mg total) by mouth 2 (two) times daily., Disp: 20 capsule, Rfl: 0   cetirizine (ZYRTEC) 5 MG tablet, Take 1 tablet (5 mg total) by mouth daily., Disp: 30 tablet, Rfl: 0   cimetidine (TAGAMET) 800 MG tablet, TAKE 1/2 TABLET BY MOUTH ONCE OR TWICE DAILY AS NEEDED, Disp: 30 tablet, Rfl: 2   dexlansoprazole (DEXILANT) 60 MG capsule, Take 1 capsule (60 mg total) by mouth daily., Disp: 30 capsule, Rfl: 5   doxycycline (VIBRAMYCIN) 100 MG capsule, Take 100 mg by mouth 2 (two) times daily., Disp: , Rfl:    EPINEPHrine 0.3 mg/0.3 mL IJ SOAJ injection, Inject 0.3 mg into the muscle as needed for anaphylaxis., Disp: 2 each, Rfl: 1   famotidine (PEPCID) 20 MG tablet, Take 20 mg by mouth 2 (two) times daily., Disp: , Rfl:    FLUoxetine (PROZAC) 20 MG capsule, Take 1 capsule by mouth daily., Disp: , Rfl:    fluticasone (FLONASE) 50 MCG/ACT nasal spray, Place 2 sprays into both nostrils 2 (two) times daily., Disp: 16 g, Rfl: 5   Fluticasone-Umeclidin-Vilant (TRELEGY ELLIPTA) 200-62.5-25 MCG/ACT AEPB, Inhale 1 puff into the lungs daily., Disp: 28 each, Rfl: 5   folic acid (FOLVITE) 1 MG tablet, Take 1 tablet (1 mg total) by mouth daily., Disp: 30 tablet, Rfl: 5   gabapentin (NEURONTIN) 300 MG capsule, , Disp: , Rfl:    HYDROcodone bit-homatropine (HYCODAN) 5-1.5 MG/5ML syrup, TAKE ONE TEASPOONFUL (5 ml) BY MOUTH EVERY 4 HOURS, Disp: , Rfl:    ipratropium (ATROVENT) 0.06 % nasal spray, USE 2 SPRAYS IN EACH NOSTRIL THREE TIMES A DAY AS NEEDED., Disp: 15 mL, Rfl: 11   Lactulose 20 GM/30ML SOLN, Take 30 mLs (20 g total) by mouth daily as needed. Take 30 ml by mouth every 3 hours until bowel movement then take daily as needed for constipation, Disp: 450 mL, Rfl: 2   lidocaine-prilocaine (EMLA) cream, Apply a quarter sized amount to port a cath site and cover with plastic wrap one hour prior to infusion  appointments, Disp: 30 g, Rfl: 3   megestrol (MEGACE) 400 MG/10ML  suspension, Take 10 mLs (400 mg total) by mouth 2 (two) times daily., Disp: 480 mL, Rfl: 2   montelukast (SINGULAIR) 10 MG tablet, TAKE ONE TABLET BY MOUTH AT BEDTIME., Disp: 30 tablet, Rfl: 5   naproxen (NAPROSYN) 500 MG tablet, Take 500 mg by mouth 2 (two) times daily., Disp: , Rfl:    NON FORMULARY, [facility]  Antifungal (nail)-#1, Disp: , Rfl:    NON FORMULARY, [facility]  Anti-fungal (nail)-#1, Disp: , Rfl:    omeprazole (PRILOSEC) 20 MG capsule, Take 1 capsule (20 mg total) by mouth daily., Disp: 30 capsule, Rfl: 3   oxyCODONE-acetaminophen (PERCOCET/ROXICET) 5-325 MG tablet, Take 1 tablet by mouth every 6 (six) hours as needed for severe pain., Disp: 20 tablet, Rfl: 0   pantoprazole (PROTONIX) 40 MG tablet, Take 40 mg by mouth daily., Disp: , Rfl:    prochlorperazine (COMPAZINE) 10 MG tablet, Take 1 tablet (10 mg total) by mouth every 6 (six) hours as needed for nausea or vomiting., Disp: 30 tablet, Rfl: 0   prochlorperazine (COMPAZINE) 10 MG tablet, Take 1 tablet (10 mg total) by mouth every 6 (six) hours as needed for nausea or vomiting., Disp: 60 tablet, Rfl: 3   Respiratory Therapy Supplies (FLUTTER) DEVI, Use as directed, Disp: 1 each, Rfl: 3   SF 5000 PLUS 1.1 % CREA dental cream, , Disp: , Rfl:    Spacer/Aero-Holding Chambers (AEROCHAMBER PLUS WITH MASK) inhaler, 1 each by Other route See admin instructions. Use with inhaler as instructed., Disp: 1 each, Rfl: 1   traZODone (DESYREL) 50 MG tablet, Take 1 tablet (50 mg total) by mouth at bedtime., Disp: 30 tablet, Rfl: 1   triamcinolone cream (KENALOG) 0.1 %, , Disp: , Rfl:   Current Facility-Administered Medications:    tezepelumab-ekko (TEZSPIRE) 210 MG/1.91ML syringe 210 mg, 210 mg, Subcutaneous, Q28 days, Kennith Gain, MD, 210 mg at 08/23/22 1031  Facility-Administered Medications Ordered in Other Visits:    sodium chloride flush  (NS) 0.9 % injection 10 mL, 10 mL, Intravenous, PRN, Derek Jack, MD   Allergies: Allergies  Allergen Reactions   Levonorgestrel-Ethinyl Estrad Cough   Sulfa Antibiotics Other (See Comments)    Unknown- pt unsure of reaction; believes it may be nausea   Sulfamethoxazole-Trimethoprim Other (See Comments)   Cephalosporins Other (See Comments)    Other Reaction: Toxicity    REVIEW OF SYSTEMS:   Review of Systems  Constitutional:  Negative for chills, fatigue and fever.  HENT:   Negative for lump/mass, mouth sores, nosebleeds, sore throat and trouble swallowing.   Eyes:  Negative for eye problems.  Respiratory:  Negative for cough and shortness of breath.   Cardiovascular:  Negative for chest pain, leg swelling and palpitations.  Gastrointestinal:  Positive for constipation. Negative for abdominal pain, diarrhea, nausea and vomiting.  Genitourinary:  Negative for bladder incontinence, difficulty urinating, dysuria, frequency, hematuria and nocturia.   Musculoskeletal:  Negative for arthralgias, back pain, flank pain, myalgias and neck pain.  Skin:  Negative for itching and rash.  Neurological:  Negative for dizziness, headaches and numbness.  Hematological:  Does not bruise/bleed easily.  Psychiatric/Behavioral:  Negative for depression, sleep disturbance and suicidal ideas. The patient is not nervous/anxious.   All other systems reviewed and are negative.    VITALS:   There were no vitals taken for this visit.  Wt Readings from Last 3 Encounters:  08/30/22 145 lb 9.6 oz (66 kg)  08/16/22 137 lb 3.2 oz (62.2 kg)  08/02/22 139  lb 12.8 oz (63.4 kg)    There is no height or weight on file to calculate BMI.  Performance status (ECOG): 1 - Symptomatic but completely ambulatory  PHYSICAL EXAM:   Physical Exam Vitals and nursing note reviewed. Exam conducted with a chaperone present.  Constitutional:      Appearance: Normal appearance.  Cardiovascular:     Rate and  Rhythm: Normal rate and regular rhythm.     Pulses: Normal pulses.     Heart sounds: Normal heart sounds.  Pulmonary:     Effort: Pulmonary effort is normal.     Breath sounds: Normal breath sounds.  Abdominal:     Palpations: Abdomen is soft. There is no hepatomegaly, splenomegaly or mass.     Tenderness: There is no abdominal tenderness.  Musculoskeletal:     Right lower leg: No edema.     Left lower leg: No edema.  Lymphadenopathy:     Cervical: No cervical adenopathy.     Right cervical: No superficial, deep or posterior cervical adenopathy.    Left cervical: No superficial, deep or posterior cervical adenopathy.     Upper Body:     Right upper body: No supraclavicular or axillary adenopathy.     Left upper body: No supraclavicular or axillary adenopathy.  Neurological:     General: No focal deficit present.     Mental Status: She is alert and oriented to person, place, and time.  Psychiatric:        Mood and Affect: Mood normal.        Behavior: Behavior normal.     LABS:      Latest Ref Rng & Units 08/30/2022   11:52 AM 08/16/2022    9:56 AM 08/09/2022    8:18 AM  CBC  WBC 4.0 - 10.5 K/uL 3.0  3.0  2.4   Hemoglobin 12.0 - 15.0 g/dL 9.3  8.9  9.0   Hematocrit 36.0 - 46.0 % 30.5  29.0  29.6   Platelets 150 - 400 K/uL 225  93  39       Latest Ref Rng & Units 08/30/2022   11:52 AM 08/16/2022    9:56 AM 08/09/2022    8:18 AM  CMP  Glucose 70 - 99 mg/dL 94  109  108   BUN 8 - 23 mg/dL 20  19  23   $ Creatinine 0.44 - 1.00 mg/dL 1.04  1.05  1.15   Sodium 135 - 145 mmol/L 136  138  137   Potassium 3.5 - 5.1 mmol/L 4.1  3.7  3.9   Chloride 98 - 111 mmol/L 106  107  105   CO2 22 - 32 mmol/L 24  24  24   $ Calcium 8.9 - 10.3 mg/dL 9.1  9.4  9.2   Total Protein 6.5 - 8.1 g/dL 6.6  7.0  7.0   Total Bilirubin 0.3 - 1.2 mg/dL 0.7  1.2  1.3   Alkaline Phos 38 - 126 U/L 36  37  44   AST 15 - 41 U/L 22  17  19   $ ALT 0 - 44 U/L 13  11  14      $ No results found for: "CEA1",  "CEA" / No results found for: "CEA1", "CEA" No results found for: "PSA1" No results found for: "[id number]" No results found for: "CAN125"  Lab Results  Component Value Date   TOTALPROTELP 6.3 04/26/2022   ALBUMINELP 3.8 04/26/2022   A1GS 0.3 04/26/2022   A2GS 0.5  04/26/2022   BETS 0.8 04/26/2022   GAMS 0.9 04/26/2022   MSPIKE Not Observed 04/26/2022   SPEI Comment 04/26/2022   Lab Results  Component Value Date   TIBC 343 04/26/2022   FERRITIN 114 04/26/2022   IRONPCTSAT 25 04/26/2022   Lab Results  Component Value Date   LDH 305 (H) 08/30/2022   LDH 420 (H) 07/08/2022   LDH 416 (H) 06/04/2022     STUDIES:   No results found.

## 2022-08-30 NOTE — Patient Instructions (Addendum)
[facility] at [facility] Discharge Instructions   You were seen and examined today by Dr. Delton Coombes.  Your lab results from today are pending.   We will proceed with your treatment pending your lab results.   We will arrange for you to have a bone marrow biopsy after the completion of this cycle.   Return as scheduled.    Thank you for choosing [facility] at [facility] to provide your oncology and hematology care.  To afford each patient quality time with our provider, please arrive at least 15 minutes before your scheduled appointment time.   If you have a lab appointment with the [facility] please come in thru the Main Entrance and check in at the main information desk.  You need to re-schedule your appointment should you arrive 10 or more minutes late.  We strive to give you quality time with our providers, and arriving late affects you and other patients whose appointments are after yours.  Also, if you no show three or more times for appointments you may be dismissed from the clinic at the providers discretion.     Again, thank you for choosing [facility].  Our hope is that these requests will decrease the amount of time that you wait before being seen by our physicians.       _____________________________________________________________  Should you have questions after your visit to [facility], please contact our office at [phone] and follow the prompts.  Our office hours are 8:00 a.m. and 4:30 p.m. Monday - Friday.  Please note that voicemails left after 4:00 p.m. may not be returned until the following business day.  We are closed weekends and major holidays.  You do have access to a nurse 24-7, just call the main number to the clinic [phone] and do not press any options, hold on the line and a nurse will answer the phone.    For prescription refill requests, have your pharmacy contact our  office and allow 72 hours.    Due to Covid, you will need to wear a mask upon entering the hospital. If you do not have a mask, a mask will be given to you at the Main Entrance upon arrival. For doctor visits, patients may have 1 support person age 38 or older with them. For treatment visits, patients can not have anyone with them due to social distancing guidelines and our immunocompromised population.

## 2022-08-30 NOTE — Patient Instructions (Signed)
[facility]  Discharge Instructions: Thank you for choosing [facility] to provide your oncology and hematology care.  If you have a lab appointment with the Cancer Center, please come in thru the Main Entrance and check in at the main information desk.  Wear comfortable clothing and clothing appropriate for easy access to any Portacath or PICC line.   We strive to give you quality time with your provider. You may need to reschedule your appointment if you arrive late (15 or more minutes).  Arriving late affects you and other patients whose appointments are after yours.  Also, if you miss three or more appointments without notifying the office, you may be dismissed from the clinic at the provider's discretion.      For prescription refill requests, have your pharmacy contact our office and allow 72 hours for refills to be completed.    Today you received the following chemotherapy and/or immunotherapy agents Vidaza      To help prevent nausea and vomiting after your treatment, we encourage you to take your nausea medication as directed.  BELOW ARE SYMPTOMS THAT SHOULD BE REPORTED IMMEDIATELY: *FEVER GREATER THAN 100.4 F (38 C) OR HIGHER *CHILLS OR SWEATING *NAUSEA AND VOMITING THAT IS NOT CONTROLLED WITH YOUR NAUSEA MEDICATION *UNUSUAL SHORTNESS OF BREATH *UNUSUAL BRUISING OR BLEEDING *URINARY PROBLEMS (pain or burning when urinating, or frequent urination) *BOWEL PROBLEMS (unusual diarrhea, constipation, pain near the anus) TENDERNESS IN MOUTH AND THROAT WITH OR WITHOUT PRESENCE OF ULCERS (sore throat, sores in mouth, or a toothache) UNUSUAL RASH, SWELLING OR PAIN  UNUSUAL VAGINAL DISCHARGE OR ITCHING   Items with * indicate a potential emergency and should be followed up as soon as possible or go to the Emergency Department if any problems should occur.  Please show the CHEMOTHERAPY ALERT CARD or IMMUNOTHERAPY ALERT CARD at check-in to the Emergency  Department and triage nurse.  Should you have questions after your visit or need to cancel or reschedule your appointment, please contact [facility] [phone]  and follow the prompts.  Office hours are 8:00 a.m. to 4:30 p.m. Monday - Friday. Please note that voicemails left after 4:00 p.m. may not be returned until the following business day.  We are closed weekends and major holidays. You have access to a nurse at all times for urgent questions. Please call the main number to the clinic [phone] and follow the prompts.  For any non-urgent questions, you may also contact your provider using MyChart. We now offer e-Visits for anyone 18 and older to request care online for non-urgent symptoms. For details visit [email or web address].   Also download the MyChart app! Go to the app store, search "MyChart", open the app, select [facility], and log in with your MyChart username and password.   

## 2022-08-30 NOTE — Progress Notes (Signed)
Patient presents today for Vidaza infusion per providers order.  Vital signs within parameters for treatment.  Labs pending.  Message received from Anastasio Champion RN/Dr. Delton Coombes patient okay for treatment pending lab results.  Labs within parameters for treatment.  Treatment given today per MD orders.  Stable during infusion without adverse affects.  Vital signs stable.  No complaints at this time.  Discharge from clinic ambulatory in stable condition.  Alert and oriented X 3.  Follow up with [facility] as scheduled.

## 2022-08-31 ENCOUNTER — Other Ambulatory Visit: Payer: Self-pay

## 2022-08-31 ENCOUNTER — Inpatient Hospital Stay: Payer: Medicare HMO

## 2022-08-31 VITALS — BP 99/55 | HR 73 | Temp 98.5°F | Resp 17

## 2022-08-31 DIAGNOSIS — R634 Abnormal weight loss: Secondary | ICD-10-CM | POA: Diagnosis not present

## 2022-08-31 DIAGNOSIS — R161 Splenomegaly, not elsewhere classified: Secondary | ICD-10-CM | POA: Diagnosis not present

## 2022-08-31 DIAGNOSIS — Z95828 Presence of other vascular implants and grafts: Secondary | ICD-10-CM

## 2022-08-31 DIAGNOSIS — D539 Nutritional anemia, unspecified: Secondary | ICD-10-CM | POA: Diagnosis not present

## 2022-08-31 DIAGNOSIS — Z5111 Encounter for antineoplastic chemotherapy: Secondary | ICD-10-CM | POA: Diagnosis not present

## 2022-08-31 DIAGNOSIS — D696 Thrombocytopenia, unspecified: Secondary | ICD-10-CM | POA: Diagnosis not present

## 2022-08-31 DIAGNOSIS — K5900 Constipation, unspecified: Secondary | ICD-10-CM | POA: Diagnosis not present

## 2022-08-31 DIAGNOSIS — E538 Deficiency of other specified B group vitamins: Secondary | ICD-10-CM | POA: Diagnosis not present

## 2022-08-31 DIAGNOSIS — C9310 Chronic myelomonocytic leukemia not having achieved remission: Secondary | ICD-10-CM | POA: Diagnosis not present

## 2022-08-31 DIAGNOSIS — J45909 Unspecified asthma, uncomplicated: Secondary | ICD-10-CM | POA: Diagnosis not present

## 2022-08-31 MED ORDER — SODIUM CHLORIDE 0.9 % IV SOLN: Freq: Once | INTRAVENOUS | Status: AC

## 2022-08-31 MED ORDER — SODIUM CHLORIDE 0.9% FLUSH
10.0000 mL | Freq: Once | INTRAVENOUS | Status: AC
  Administered 2022-08-31: 10 mL via INTRAVENOUS

## 2022-08-31 MED ORDER — HEPARIN SOD (PORK) LOCK FLUSH 100 UNIT/ML IV SOLN
500.0000 [IU] | Freq: Once | INTRAVENOUS | Status: AC
  Administered 2022-08-31: 500 [IU] via INTRAVENOUS

## 2022-08-31 MED ORDER — SODIUM CHLORIDE 0.9 % IV SOLN
75.0000 mg/m2 | Freq: Once | INTRAVENOUS | Status: AC
  Administered 2022-08-31: 126 mg via INTRAVENOUS
  Filled 2022-08-31: qty 12.6

## 2022-08-31 NOTE — Progress Notes (Signed)
Patient presents today for Vidaza infusion.  Patient is satisfactory condition with no new complaints voiced.  Vital signs are stable. We will proceed with treatment per MD orders.   Patient tolerated treatment well with no complaints voiced.  Patient left ambulatory in stable condition.  Vital signs stable at discharge.  Follow up as scheduled.

## 2022-08-31 NOTE — Patient Instructions (Signed)
[facility]  Discharge Instructions: Thank you for choosing [facility] to provide your oncology and hematology care.  If you have a lab appointment with the Cancer Center, please come in thru the Main Entrance and check in at the main information desk.  Wear comfortable clothing and clothing appropriate for easy access to any Portacath or PICC line.   We strive to give you quality time with your provider. You may need to reschedule your appointment if you arrive late (15 or more minutes).  Arriving late affects you and other patients whose appointments are after yours.  Also, if you miss three or more appointments without notifying the office, you may be dismissed from the clinic at the provider's discretion.      For prescription refill requests, have your pharmacy contact our office and allow 72 hours for refills to be completed.    Today you received the following chemotherapy and/or immunotherapy agents Vidaza. Azacitidine Injection What is this medication? AZACITIDINE (ay za SITE i deen) treats blood and bone marrow cancers. It works by slowing down the growth of cancer cells. This medicine may be used for other purposes; ask your health care provider or pharmacist if you have questions. COMMON BRAND NAME(S): Vidaza What should I tell my care team before I take this medication? They need to know if you have any of these conditions: Kidney disease Liver disease Low blood cell levels, such as low white cells, platelets, or red blood cells Low levels of albumin in the blood Low levels of bicarbonate in the blood An unusual or allergic reaction to azacitidine, mannitol, other medications, foods, dyes, or preservatives If you or your partner are pregnant or trying to get pregnant Breast-feeding How should I use this medication? This medication is injected into a vein or under the skin. It is given by your care team in a hospital or clinic setting. Talk  to your care team about the use of this medication in children. While it may be prescribed for children as young as 1 month for selected conditions, precautions do apply. Overdosage: If you think you have taken too much of this medicine contact a poison control center or emergency room at once. NOTE: This medicine is only for you. Do not share this medicine with others. What if I miss a dose? Keep appointments for follow-up doses. It is important not to miss your dose. Call your care team if you are unable to keep an appointment. What may interact with this medication? Interactions are not expected. This list may not describe all possible interactions. Give your health care provider a list of all the medicines, herbs, non-prescription drugs, or dietary supplements you use. Also tell them if you smoke, drink alcohol, or use illegal drugs. Some items may interact with your medicine. What should I watch for while using this medication? Your condition will be monitored carefully while you are receiving this medication. You may need blood work while taking this medication. This medication may make you feel generally unwell. This is not uncommon as chemotherapy can affect healthy cells as well as cancer cells. Report any side effects. Continue your course of treatment even though you feel ill unless your care team tells you to stop. Other product types may be available that contain the medication azacitidine. The injection and oral products should not be used in place of one another. Talk to your care team if you have questions. This medication can cause serious side effects.   To reduce the risk, your care team may give you other medications to take before receiving this one. Be sure to follow the directions from your care team. This medication may increase your risk of getting an infection. Call your care team for advice if you get a fever, chills, sore throat, or other symptoms of a cold or flu. Do not treat  yourself. Try to avoid being around people who are sick. Avoid taking medications that contain aspirin, acetaminophen, ibuprofen, naproxen, or ketoprofen unless instructed by your care team. These medications may hide a fever. This medication may increase your risk to bruise or bleed. Call your care team if you notice any unusual bleeding. Be careful brushing or flossing your teeth or using a toothpick because you may get an infection or bleed more easily. If you have any dental work done, tell your dentist you are receiving this medication. Talk to your care team if you or your partner may be pregnant. Serious birth defects can occur if you take this medication during pregnancy and for 6 months after the last dose. You will need a negative pregnancy test before starting this medication. Contraception is recommended while taking his medication and for 6 months after the last dose. Your care team can help you find the option that works for you. If your partner can get pregnant, use a condom during sex while taking this medication and for 3 months after the last dose. Do not breastfeed while taking this medication and for 1 week after the last dose. This medication may cause infertility. Talk to your care team if you are concerned about your fertility. What side effects may I notice from receiving this medication? Side effects that you should report to your care team as soon as possible: Allergic reactions--skin rash, itching, hives, swelling of the face, lips, tongue, or throat Infection--fever, chills, cough, sore throat, wounds that don't heal, pain or trouble when passing urine, general feeling of discomfort or being unwell Kidney injury--decrease in the amount of urine, swelling of the ankles, hands, or feet Liver injury--right upper belly pain, loss of appetite, nausea, light-colored stool, dark yellow or Oseas Detty urine, yellowing skin or eyes, unusual weakness or fatigue Low red blood cell  level--unusual weakness or fatigue, dizziness, headache, trouble breathing Tumor lysis syndrome (TLS)--nausea, vomiting, diarrhea, decrease in the amount of urine, dark urine, unusual weakness or fatigue, confusion, muscle pain or cramps, fast or irregular heartbeat, joint pain Unusual bruising or bleeding Side effects that usually do not require medical attention (report to your care team if they continue or are bothersome): Constipation Diarrhea Nausea Pain, redness, or irritation at injection site Vomiting This list may not describe all possible side effects. Call your doctor for medical advice about side effects. You may report side effects to FDA at 1-800-FDA-1088. Where should I keep my medication? This medication is given in a hospital or clinic. It will not be stored at home. NOTE: This sheet is a summary. It may not cover all possible information. If you have questions about this medicine, talk to your doctor, pharmacist, or health care provider.  2023 Elsevier/Gold Standard (2021-11-19 00:00:00)       To help prevent nausea and vomiting after your treatment, we encourage you to take your nausea medication as directed.  BELOW ARE SYMPTOMS THAT SHOULD BE REPORTED IMMEDIATELY: *FEVER GREATER THAN 100.4 F (38 C) OR HIGHER *CHILLS OR SWEATING *NAUSEA AND VOMITING THAT IS NOT CONTROLLED WITH YOUR NAUSEA MEDICATION *UNUSUAL SHORTNESS OF BREATH *UNUSUAL   BRUISING OR BLEEDING *URINARY PROBLEMS (pain or burning when urinating, or frequent urination) *BOWEL PROBLEMS (unusual diarrhea, constipation, pain near the anus) TENDERNESS IN MOUTH AND THROAT WITH OR WITHOUT PRESENCE OF ULCERS (sore throat, sores in mouth, or a toothache) UNUSUAL RASH, SWELLING OR PAIN  UNUSUAL VAGINAL DISCHARGE OR ITCHING   Items with * indicate a potential emergency and should be followed up as soon as possible or go to the Emergency Department if any problems should occur.  Please show the CHEMOTHERAPY ALERT  CARD or IMMUNOTHERAPY ALERT CARD at check-in to the Emergency Department and triage nurse.  Should you have questions after your visit or need to cancel or reschedule your appointment, please contact [facility] [phone]  and follow the prompts.  Office hours are 8:00 a.m. to 4:30 p.m. Monday - Friday. Please note that voicemails left after 4:00 p.m. may not be returned until the following business day.  We are closed weekends and major holidays. You have access to a nurse at all times for urgent questions. Please call the main number to the clinic [phone] and follow the prompts.  For any non-urgent questions, you may also contact your provider using MyChart. We now offer e-Visits for anyone 18 and older to request care online for non-urgent symptoms. For details visit [email or web address].   Also download the MyChart app! Go to the app store, search "MyChart", open the app, select [place], and log in with your MyChart username and password.   

## 2022-09-01 ENCOUNTER — Inpatient Hospital Stay: Payer: Medicare HMO

## 2022-09-01 ENCOUNTER — Other Ambulatory Visit: Payer: Self-pay

## 2022-09-01 VITALS — BP 98/56 | HR 82 | Temp 98.7°F | Resp 18

## 2022-09-01 DIAGNOSIS — D696 Thrombocytopenia, unspecified: Secondary | ICD-10-CM | POA: Diagnosis not present

## 2022-09-01 DIAGNOSIS — R634 Abnormal weight loss: Secondary | ICD-10-CM | POA: Diagnosis not present

## 2022-09-01 DIAGNOSIS — K5900 Constipation, unspecified: Secondary | ICD-10-CM | POA: Diagnosis not present

## 2022-09-01 DIAGNOSIS — Z5111 Encounter for antineoplastic chemotherapy: Secondary | ICD-10-CM | POA: Diagnosis not present

## 2022-09-01 DIAGNOSIS — R161 Splenomegaly, not elsewhere classified: Secondary | ICD-10-CM | POA: Diagnosis not present

## 2022-09-01 DIAGNOSIS — C9310 Chronic myelomonocytic leukemia not having achieved remission: Secondary | ICD-10-CM

## 2022-09-01 DIAGNOSIS — E538 Deficiency of other specified B group vitamins: Secondary | ICD-10-CM | POA: Diagnosis not present

## 2022-09-01 DIAGNOSIS — D539 Nutritional anemia, unspecified: Secondary | ICD-10-CM | POA: Diagnosis not present

## 2022-09-01 DIAGNOSIS — Z95828 Presence of other vascular implants and grafts: Secondary | ICD-10-CM

## 2022-09-01 DIAGNOSIS — J45909 Unspecified asthma, uncomplicated: Secondary | ICD-10-CM | POA: Diagnosis not present

## 2022-09-01 MED ORDER — PALONOSETRON HCL INJECTION 0.25 MG/5ML
0.2500 mg | Freq: Once | INTRAVENOUS | Status: AC
  Administered 2022-09-01: 0.25 mg via INTRAVENOUS
  Filled 2022-09-01: qty 5

## 2022-09-01 MED ORDER — SODIUM CHLORIDE 0.9% FLUSH
10.0000 mL | Freq: Once | INTRAVENOUS | Status: AC
  Administered 2022-09-01: 10 mL via INTRAVENOUS

## 2022-09-01 MED ORDER — SODIUM CHLORIDE 0.9 % IV SOLN
75.0000 mg/m2 | Freq: Once | INTRAVENOUS | Status: AC
  Administered 2022-09-01: 126 mg via INTRAVENOUS
  Filled 2022-09-01: qty 12.6

## 2022-09-01 MED ORDER — HEPARIN SOD (PORK) LOCK FLUSH 100 UNIT/ML IV SOLN
500.0000 [IU] | Freq: Once | INTRAVENOUS | Status: AC
  Administered 2022-09-01: 500 [IU] via INTRAVENOUS

## 2022-09-01 MED ORDER — SODIUM CHLORIDE 0.9 % IV SOLN: Freq: Once | INTRAVENOUS | Status: AC

## 2022-09-01 MED ORDER — SODIUM CHLORIDE 0.9 % IV SOLN: 75.0000 mg/m2 | Freq: Once | INTRAVENOUS | Status: DC

## 2022-09-01 NOTE — Progress Notes (Signed)
Patient presents today for Vidaza infusion per providers order.  Vital signs within parameters for treatment.  Patient has no new complaints at this time.  Treatment given today per MD orders.  Stable during infusion without adverse affects.  Vital signs stable.  No complaints at this time.  Discharge from clinic ambulatory in stable condition.  Alert and oriented X 3.  Follow up with [facility] as scheduled.  

## 2022-09-01 NOTE — Patient Instructions (Signed)
[facility] [place]  Discharge Instructions: Thank you for choosing [facility] to provide your oncology and hematology care.  If you have a lab appointment with the Cancer Center, please come in thru the Main Entrance and check in at the main information desk.  Wear comfortable clothing and clothing appropriate for easy access to any Portacath or PICC line.   We strive to give you quality time with your provider. You may need to reschedule your appointment if you arrive late (15 or more minutes).  Arriving late affects you and other patients whose appointments are after yours.  Also, if you miss three or more appointments without notifying the office, you may be dismissed from the clinic at the provider's discretion.      For prescription refill requests, have your pharmacy contact our office and allow 72 hours for refills to be completed.    Today you received the following chemotherapy and/or immunotherapy agents Vidaza      To help prevent nausea and vomiting after your treatment, we encourage you to take your nausea medication as directed.  BELOW ARE SYMPTOMS THAT SHOULD BE REPORTED IMMEDIATELY: *FEVER GREATER THAN 100.4 F (38 C) OR HIGHER *CHILLS OR SWEATING *NAUSEA AND VOMITING THAT IS NOT CONTROLLED WITH YOUR NAUSEA MEDICATION *UNUSUAL SHORTNESS OF BREATH *UNUSUAL BRUISING OR BLEEDING *URINARY PROBLEMS (pain or burning when urinating, or frequent urination) *BOWEL PROBLEMS (unusual diarrhea, constipation, pain near the anus) TENDERNESS IN MOUTH AND THROAT WITH OR WITHOUT PRESENCE OF ULCERS (sore throat, sores in mouth, or a toothache) UNUSUAL RASH, SWELLING OR PAIN  UNUSUAL VAGINAL DISCHARGE OR ITCHING   Items with * indicate a potential emergency and should be followed up as soon as possible or go to the Emergency Department if any problems should occur.  Please show the CHEMOTHERAPY ALERT CARD or IMMUNOTHERAPY ALERT CARD at check-in to the Emergency  Department and triage nurse.  Should you have questions after your visit or need to cancel or reschedule your appointment, please contact [facility] [phone]  and follow the prompts.  Office hours are 8:00 a.m. to 4:30 p.m. Monday - Friday. Please note that voicemails left after 4:00 p.m. may not be returned until the following business day.  We are closed weekends and major holidays. You have access to a nurse at all times for urgent questions. Please call the main number to the clinic [phone] and follow the prompts.  For any non-urgent questions, you may also contact your provider using MyChart. We now offer e-Visits for anyone 18 and older to request care online for non-urgent symptoms. For details visit [email or web address].   Also download the MyChart app! Go to the app store, search "MyChart", open the app, select [place], and log in with your MyChart username and password.   

## 2022-09-02 ENCOUNTER — Inpatient Hospital Stay: Payer: Medicare HMO

## 2022-09-02 VITALS — BP 99/58 | HR 75 | Temp 98.4°F | Resp 18

## 2022-09-02 DIAGNOSIS — E538 Deficiency of other specified B group vitamins: Secondary | ICD-10-CM | POA: Diagnosis not present

## 2022-09-02 DIAGNOSIS — Z5111 Encounter for antineoplastic chemotherapy: Secondary | ICD-10-CM | POA: Diagnosis not present

## 2022-09-02 DIAGNOSIS — R634 Abnormal weight loss: Secondary | ICD-10-CM | POA: Diagnosis not present

## 2022-09-02 DIAGNOSIS — Z95828 Presence of other vascular implants and grafts: Secondary | ICD-10-CM

## 2022-09-02 DIAGNOSIS — D539 Nutritional anemia, unspecified: Secondary | ICD-10-CM | POA: Diagnosis not present

## 2022-09-02 DIAGNOSIS — C9310 Chronic myelomonocytic leukemia not having achieved remission: Secondary | ICD-10-CM

## 2022-09-02 DIAGNOSIS — K5900 Constipation, unspecified: Secondary | ICD-10-CM | POA: Diagnosis not present

## 2022-09-02 DIAGNOSIS — J45909 Unspecified asthma, uncomplicated: Secondary | ICD-10-CM | POA: Diagnosis not present

## 2022-09-02 DIAGNOSIS — D696 Thrombocytopenia, unspecified: Secondary | ICD-10-CM | POA: Diagnosis not present

## 2022-09-02 DIAGNOSIS — R161 Splenomegaly, not elsewhere classified: Secondary | ICD-10-CM | POA: Diagnosis not present

## 2022-09-02 MED ORDER — SODIUM CHLORIDE 0.9 % IV SOLN: Freq: Once | INTRAVENOUS | Status: AC

## 2022-09-02 MED ORDER — HEPARIN SOD (PORK) LOCK FLUSH 100 UNIT/ML IV SOLN
500.0000 [IU] | Freq: Once | INTRAVENOUS | Status: AC
  Administered 2022-09-02: 500 [IU] via INTRAVENOUS

## 2022-09-02 MED ORDER — SODIUM CHLORIDE 0.9 % IV SOLN
75.0000 mg/m2 | Freq: Once | INTRAVENOUS | Status: AC
  Administered 2022-09-02: 126 mg via INTRAVENOUS
  Filled 2022-09-02: qty 12.6

## 2022-09-02 MED ORDER — SODIUM CHLORIDE 0.9% FLUSH
10.0000 mL | Freq: Once | INTRAVENOUS | Status: AC
  Administered 2022-09-02: 10 mL via INTRAVENOUS

## 2022-09-02 MED FILL — Azacitidine For Inj 100 MG: INTRAMUSCULAR | Qty: 12.6 | Status: AC

## 2022-09-02 NOTE — Progress Notes (Signed)
Patient presents today for Vidaza infusion per providers order.  Vital signs within parameters for treatment.  Patient has no new complaints at this time.  Treatment given today per MD orders.  Stable during infusion without adverse affects.  Vital signs stable.  No complaints at this time.  Discharge from clinic ambulatory in stable condition.  Alert and oriented X 3.  Follow up with [facility] as scheduled.  

## 2022-09-02 NOTE — Patient Instructions (Signed)
[facility] [place]  Discharge Instructions: Thank you for choosing [facility] to provide your oncology and hematology care.  If you have a lab appointment with the Cancer Center, please come in thru the Main Entrance and check in at the main information desk.  Wear comfortable clothing and clothing appropriate for easy access to any Portacath or PICC line.   We strive to give you quality time with your provider. You may need to reschedule your appointment if you arrive late (15 or more minutes).  Arriving late affects you and other patients whose appointments are after yours.  Also, if you miss three or more appointments without notifying the office, you may be dismissed from the clinic at the provider's discretion.      For prescription refill requests, have your pharmacy contact our office and allow 72 hours for refills to be completed.    Today you received the following chemotherapy and/or immunotherapy agents Vidaza      To help prevent nausea and vomiting after your treatment, we encourage you to take your nausea medication as directed.  BELOW ARE SYMPTOMS THAT SHOULD BE REPORTED IMMEDIATELY: *FEVER GREATER THAN 100.4 F (38 C) OR HIGHER *CHILLS OR SWEATING *NAUSEA AND VOMITING THAT IS NOT CONTROLLED WITH YOUR NAUSEA MEDICATION *UNUSUAL SHORTNESS OF BREATH *UNUSUAL BRUISING OR BLEEDING *URINARY PROBLEMS (pain or burning when urinating, or frequent urination) *BOWEL PROBLEMS (unusual diarrhea, constipation, pain near the anus) TENDERNESS IN MOUTH AND THROAT WITH OR WITHOUT PRESENCE OF ULCERS (sore throat, sores in mouth, or a toothache) UNUSUAL RASH, SWELLING OR PAIN  UNUSUAL VAGINAL DISCHARGE OR ITCHING   Items with * indicate a potential emergency and should be followed up as soon as possible or go to the Emergency Department if any problems should occur.  Please show the CHEMOTHERAPY ALERT CARD or IMMUNOTHERAPY ALERT CARD at check-in to the Emergency  Department and triage nurse.  Should you have questions after your visit or need to cancel or reschedule your appointment, please contact [facility] [place] [phone]  and follow the prompts.  Office hours are 8:00 a.m. to 4:30 p.m. Monday - Friday. Please note that voicemails left after 4:00 p.m. may not be returned until the following business day.  We are closed weekends and major holidays. You have access to a nurse at all times for urgent questions. Please call the main number to the clinic [phone] and follow the prompts.  For any non-urgent questions, you may also contact your provider using MyChart. We now offer e-Visits for anyone 18 and older to request care online for non-urgent symptoms. For details visit [email or web address].   Also download the MyChart app! Go to the app store, search "MyChart", open the app, select , and log in with your MyChart username and password.   

## 2022-09-03 ENCOUNTER — Inpatient Hospital Stay: Payer: Medicare HMO

## 2022-09-03 VITALS — BP 97/65 | HR 84 | Temp 98.7°F | Resp 18

## 2022-09-03 DIAGNOSIS — Z95828 Presence of other vascular implants and grafts: Secondary | ICD-10-CM

## 2022-09-03 DIAGNOSIS — C9310 Chronic myelomonocytic leukemia not having achieved remission: Secondary | ICD-10-CM

## 2022-09-03 DIAGNOSIS — K5900 Constipation, unspecified: Secondary | ICD-10-CM | POA: Diagnosis not present

## 2022-09-03 DIAGNOSIS — E538 Deficiency of other specified B group vitamins: Secondary | ICD-10-CM | POA: Diagnosis not present

## 2022-09-03 DIAGNOSIS — J45909 Unspecified asthma, uncomplicated: Secondary | ICD-10-CM | POA: Diagnosis not present

## 2022-09-03 DIAGNOSIS — R634 Abnormal weight loss: Secondary | ICD-10-CM | POA: Diagnosis not present

## 2022-09-03 DIAGNOSIS — D539 Nutritional anemia, unspecified: Secondary | ICD-10-CM | POA: Diagnosis not present

## 2022-09-03 DIAGNOSIS — D696 Thrombocytopenia, unspecified: Secondary | ICD-10-CM | POA: Diagnosis not present

## 2022-09-03 DIAGNOSIS — Z5111 Encounter for antineoplastic chemotherapy: Secondary | ICD-10-CM | POA: Diagnosis not present

## 2022-09-03 DIAGNOSIS — R161 Splenomegaly, not elsewhere classified: Secondary | ICD-10-CM | POA: Diagnosis not present

## 2022-09-03 MED ORDER — SODIUM CHLORIDE 0.9% FLUSH
10.0000 mL | Freq: Once | INTRAVENOUS | Status: AC
  Administered 2022-09-03: 10 mL via INTRAVENOUS

## 2022-09-03 MED ORDER — HEPARIN SOD (PORK) LOCK FLUSH 100 UNIT/ML IV SOLN
500.0000 [IU] | Freq: Once | INTRAVENOUS | Status: AC
  Administered 2022-09-03: 500 [IU] via INTRAVENOUS

## 2022-09-03 MED ORDER — SODIUM CHLORIDE 0.9 % IV SOLN
75.0000 mg/m2 | Freq: Once | INTRAVENOUS | Status: AC
  Administered 2022-09-03: 126 mg via INTRAVENOUS
  Filled 2022-09-03: qty 12.6

## 2022-09-03 MED ORDER — PALONOSETRON HCL INJECTION 0.25 MG/5ML
0.2500 mg | Freq: Once | INTRAVENOUS | Status: AC
  Administered 2022-09-03: 0.25 mg via INTRAVENOUS
  Filled 2022-09-03: qty 5

## 2022-09-03 MED ORDER — SODIUM CHLORIDE 0.9 % IV SOLN: Freq: Once | INTRAVENOUS | Status: AC

## 2022-09-03 MED ORDER — SODIUM CHLORIDE 0.9% FLUSH
10.0000 mL | INTRAVENOUS | Status: AC
  Administered 2022-09-03: 10 mL

## 2022-09-03 NOTE — Patient Instructions (Signed)
[facility]  Discharge Instructions: Thank you for choosing [facility] to provide your oncology and hematology care.  If you have a lab appointment with the Cancer Center, please come in thru the Main Entrance and check in at the main information desk.  Wear comfortable clothing and clothing appropriate for easy access to any Portacath or PICC line.   We strive to give you quality time with your provider. You may need to reschedule your appointment if you arrive late (15 or more minutes).  Arriving late affects you and other patients whose appointments are after yours.  Also, if you miss three or more appointments without notifying the office, you may be dismissed from the clinic at the provider's discretion.      For prescription refill requests, have your pharmacy contact our office and allow 72 hours for refills to be completed.    Today you received the following chemotherapy and/or immunotherapy agents Vidaza. Azacitidine Injection What is this medication? AZACITIDINE (ay za SITE i deen) treats blood and bone marrow cancers. It works by slowing down the growth of cancer cells. This medicine may be used for other purposes; ask your health care provider or pharmacist if you have questions. COMMON BRAND NAME(S): Vidaza What should I tell my care team before I take this medication? They need to know if you have any of these conditions: Kidney disease Liver disease Low blood cell levels, such as low white cells, platelets, or red blood cells Low levels of albumin in the blood Low levels of bicarbonate in the blood An unusual or allergic reaction to azacitidine, mannitol, other medications, foods, dyes, or preservatives If you or your partner are pregnant or trying to get pregnant Breast-feeding How should I use this medication? This medication is injected into a vein or under the skin. It is given by your care team in a hospital or clinic setting. Talk  to your care team about the use of this medication in children. While it may be prescribed for children as young as 1 month for selected conditions, precautions do apply. Overdosage: If you think you have taken too much of this medicine contact a poison control center or emergency room at once. NOTE: This medicine is only for you. Do not share this medicine with others. What if I miss a dose? Keep appointments for follow-up doses. It is important not to miss your dose. Call your care team if you are unable to keep an appointment. What may interact with this medication? Interactions are not expected. This list may not describe all possible interactions. Give your health care provider a list of all the medicines, herbs, non-prescription drugs, or dietary supplements you use. Also tell them if you smoke, drink alcohol, or use illegal drugs. Some items may interact with your medicine. What should I watch for while using this medication? Your condition will be monitored carefully while you are receiving this medication. You may need blood work while taking this medication. This medication may make you feel generally unwell. This is not uncommon as chemotherapy can affect healthy cells as well as cancer cells. Report any side effects. Continue your course of treatment even though you feel ill unless your care team tells you to stop. Other product types may be available that contain the medication azacitidine. The injection and oral products should not be used in place of one another. Talk to your care team if you have questions. This medication can cause serious side effects.   To reduce the risk, your care team may give you other medications to take before receiving this one. Be sure to follow the directions from your care team. This medication may increase your risk of getting an infection. Call your care team for advice if you get a fever, chills, sore throat, or other symptoms of a cold or flu. Do not treat  yourself. Try to avoid being around people who are sick. Avoid taking medications that contain aspirin, acetaminophen, ibuprofen, naproxen, or ketoprofen unless instructed by your care team. These medications may hide a fever. This medication may increase your risk to bruise or bleed. Call your care team if you notice any unusual bleeding. Be careful brushing or flossing your teeth or using a toothpick because you may get an infection or bleed more easily. If you have any dental work done, tell your dentist you are receiving this medication. Talk to your care team if you or your partner may be pregnant. Serious birth defects can occur if you take this medication during pregnancy and for 6 months after the last dose. You will need a negative pregnancy test before starting this medication. Contraception is recommended while taking his medication and for 6 months after the last dose. Your care team can help you find the option that works for you. If your partner can get pregnant, use a condom during sex while taking this medication and for 3 months after the last dose. Do not breastfeed while taking this medication and for 1 week after the last dose. This medication may cause infertility. Talk to your care team if you are concerned about your fertility. What side effects may I notice from receiving this medication? Side effects that you should report to your care team as soon as possible: Allergic reactions--skin rash, itching, hives, swelling of the face, lips, tongue, or throat Infection--fever, chills, cough, sore throat, wounds that don't heal, pain or trouble when passing urine, general feeling of discomfort or being unwell Kidney injury--decrease in the amount of urine, swelling of the ankles, hands, or feet Liver injury--right upper belly pain, loss of appetite, nausea, light-colored stool, dark yellow or brown urine, yellowing skin or eyes, unusual weakness or fatigue Low red blood cell  level--unusual weakness or fatigue, dizziness, headache, trouble breathing Tumor lysis syndrome (TLS)--nausea, vomiting, diarrhea, decrease in the amount of urine, dark urine, unusual weakness or fatigue, confusion, muscle pain or cramps, fast or irregular heartbeat, joint pain Unusual bruising or bleeding Side effects that usually do not require medical attention (report to your care team if they continue or are bothersome): Constipation Diarrhea Nausea Pain, redness, or irritation at injection site Vomiting This list may not describe all possible side effects. Call your doctor for medical advice about side effects. You may report side effects to FDA at 1-800-FDA-1088. Where should I keep my medication? This medication is given in a hospital or clinic. It will not be stored at home. NOTE: This sheet is a summary. It may not cover all possible information. If you have questions about this medicine, talk to your doctor, pharmacist, or health care provider.  2023 Elsevier/Gold Standard (2021-11-19 00:00:00)       To help prevent nausea and vomiting after your treatment, we encourage you to take your nausea medication as directed.  BELOW ARE SYMPTOMS THAT SHOULD BE REPORTED IMMEDIATELY: *FEVER GREATER THAN 100.4 F (38 C) OR HIGHER *CHILLS OR SWEATING *NAUSEA AND VOMITING THAT IS NOT CONTROLLED WITH YOUR NAUSEA MEDICATION *UNUSUAL SHORTNESS OF BREATH *UNUSUAL   BRUISING OR BLEEDING *URINARY PROBLEMS (pain or burning when urinating, or frequent urination) *BOWEL PROBLEMS (unusual diarrhea, constipation, pain near the anus) TENDERNESS IN MOUTH AND THROAT WITH OR WITHOUT PRESENCE OF ULCERS (sore throat, sores in mouth, or a toothache) UNUSUAL RASH, SWELLING OR PAIN  UNUSUAL VAGINAL DISCHARGE OR ITCHING   Items with * indicate a potential emergency and should be followed up as soon as possible or go to the Emergency Department if any problems should occur.  Please show the CHEMOTHERAPY ALERT  CARD or IMMUNOTHERAPY ALERT CARD at check-in to the Emergency Department and triage nurse.  Should you have questions after your visit or need to cancel or reschedule your appointment, please contact [facility] [phone]  and follow the prompts.  Office hours are 8:00 a.m. to 4:30 p.m. Monday - Friday. Please note that voicemails left after 4:00 p.m. may not be returned until the following business day.  We are closed weekends and major holidays. You have access to a nurse at all times for urgent questions. Please call the main number to the clinic [phone] and follow the prompts.  For any non-urgent questions, you may also contact your provider using MyChart. We now offer e-Visits for anyone 18 and older to request care online for non-urgent symptoms. For details visit [email or web address].   Also download the MyChart app! Go to the app store, search "MyChart", open the app, select [facility], and log in with your MyChart username and password.   

## 2022-09-03 NOTE — Progress Notes (Signed)
Patient presents for Vidaza D5. Vital signs stable. Patient denies any changes since her last treatment.   Treatment given today per MD orders. Tolerated infusion without adverse affects. Vital signs stable. No complaints at this time. Discharged from clinic ambulatory in stable condition. Alert and oriented x 3. F/U with [facility] as scheduled.

## 2022-09-06 ENCOUNTER — Inpatient Hospital Stay: Payer: Medicare HMO

## 2022-09-06 VITALS — BP 109/66 | HR 78 | Temp 97.6°F | Resp 18

## 2022-09-06 DIAGNOSIS — R634 Abnormal weight loss: Secondary | ICD-10-CM | POA: Diagnosis not present

## 2022-09-06 DIAGNOSIS — C9310 Chronic myelomonocytic leukemia not having achieved remission: Secondary | ICD-10-CM | POA: Diagnosis not present

## 2022-09-06 DIAGNOSIS — J45909 Unspecified asthma, uncomplicated: Secondary | ICD-10-CM | POA: Diagnosis not present

## 2022-09-06 DIAGNOSIS — Z5111 Encounter for antineoplastic chemotherapy: Secondary | ICD-10-CM | POA: Diagnosis not present

## 2022-09-06 DIAGNOSIS — Z95828 Presence of other vascular implants and grafts: Secondary | ICD-10-CM

## 2022-09-06 DIAGNOSIS — K5900 Constipation, unspecified: Secondary | ICD-10-CM | POA: Diagnosis not present

## 2022-09-06 DIAGNOSIS — R161 Splenomegaly, not elsewhere classified: Secondary | ICD-10-CM | POA: Diagnosis not present

## 2022-09-06 DIAGNOSIS — E538 Deficiency of other specified B group vitamins: Secondary | ICD-10-CM | POA: Diagnosis not present

## 2022-09-06 DIAGNOSIS — D539 Nutritional anemia, unspecified: Secondary | ICD-10-CM | POA: Diagnosis not present

## 2022-09-06 DIAGNOSIS — D696 Thrombocytopenia, unspecified: Secondary | ICD-10-CM | POA: Diagnosis not present

## 2022-09-06 LAB — MAGNESIUM: Magnesium: 2.3 mg/dL (ref 1.7–2.4)

## 2022-09-06 LAB — CBC WITH DIFFERENTIAL/PLATELET
Abs Immature Granulocytes: 0.03 10*3/uL (ref 0.00–0.07)
Basophils Absolute: 0 10*3/uL (ref 0.0–0.1)
Basophils Relative: 2 %
Eosinophils Absolute: 0.2 10*3/uL (ref 0.0–0.5)
Eosinophils Relative: 9 %
HCT: 28.9 % — ABNORMAL LOW (ref 36.0–46.0)
Hemoglobin: 9 g/dL — ABNORMAL LOW (ref 12.0–15.0)
Immature Granulocytes: 1 %
Lymphocytes Relative: 27 %
Lymphs Abs: 0.6 10*3/uL — ABNORMAL LOW (ref 0.7–4.0)
MCH: 32.6 pg (ref 26.0–34.0)
MCHC: 31.1 g/dL (ref 30.0–36.0)
MCV: 104.7 fL — ABNORMAL HIGH (ref 80.0–100.0)
Monocytes Absolute: 0.2 10*3/uL (ref 0.1–1.0)
Monocytes Relative: 11 %
Neutro Abs: 1 10*3/uL — ABNORMAL LOW (ref 1.7–7.7)
Neutrophils Relative %: 50 %
Platelets: 98 10*3/uL — ABNORMAL LOW (ref 150–400)
RBC: 2.76 MIL/uL — ABNORMAL LOW (ref 3.87–5.11)
RDW: 16.2 % — ABNORMAL HIGH (ref 11.5–15.5)
Smear Review: DECREASED
WBC: 2.1 10*3/uL — ABNORMAL LOW (ref 4.0–10.5)
nRBC: 0 % (ref 0.0–0.2)

## 2022-09-06 LAB — COMPREHENSIVE METABOLIC PANEL
ALT: 14 U/L (ref 0–44)
AST: 19 U/L (ref 15–41)
Albumin: 4.1 g/dL (ref 3.5–5.0)
Alkaline Phosphatase: 47 U/L (ref 38–126)
Anion gap: 6 (ref 5–15)
BUN: 17 mg/dL (ref 8–23)
CO2: 25 mmol/L (ref 22–32)
Calcium: 9.3 mg/dL (ref 8.9–10.3)
Chloride: 106 mmol/L (ref 98–111)
Creatinine, Ser: 0.97 mg/dL (ref 0.44–1.00)
GFR, Estimated: >60 mL/min (ref >60)
Glucose, Bld: 92 mg/dL (ref 70–99)
Potassium: 4.2 mmol/L (ref 3.5–5.1)
Sodium: 137 mmol/L (ref 135–145)
Total Bilirubin: 1.3 mg/dL — ABNORMAL HIGH (ref 0.3–1.2)
Total Protein: 7 g/dL (ref 6.5–8.1)

## 2022-09-06 MED ORDER — SODIUM CHLORIDE 0.9 % IV SOLN: Freq: Once | INTRAVENOUS | Status: AC

## 2022-09-06 MED ORDER — SODIUM CHLORIDE 0.9% FLUSH
10.0000 mL | Freq: Once | INTRAVENOUS | Status: AC
  Administered 2022-09-06: 10 mL via INTRAVENOUS

## 2022-09-06 MED ORDER — SODIUM CHLORIDE 0.9 % IV SOLN
75.0000 mg/m2 | Freq: Once | INTRAVENOUS | Status: AC
  Administered 2022-09-06: 126 mg via INTRAVENOUS
  Filled 2022-09-06: qty 12.6

## 2022-09-06 MED ORDER — PALONOSETRON HCL INJECTION 0.25 MG/5ML
0.2500 mg | Freq: Once | INTRAVENOUS | Status: AC
  Administered 2022-09-06: 0.25 mg via INTRAVENOUS
  Filled 2022-09-06: qty 5

## 2022-09-06 MED ORDER — HEPARIN SOD (PORK) LOCK FLUSH 100 UNIT/ML IV SOLN
500.0000 [IU] | Freq: Once | INTRAVENOUS | Status: AC
  Administered 2022-09-06: 500 [IU] via INTRAVENOUS

## 2022-09-06 NOTE — Progress Notes (Signed)
Pt presents today for D6 Vidaza per provider's order. Vital signs and other labs WNL for treatment today. Pt's ANC is 1.0 and platelets are 98 today. Okay to proceed with treatment today per Dr.K.  D6 Vidaza given today per MD orders. Tolerated infusion without adverse affects. Vital signs stable. No complaints at this time. Discharged from clinic ambulatory in stable condition. Alert and oriented x 3. F/U with [facility] as scheduled.

## 2022-09-06 NOTE — Progress Notes (Signed)
Patients port flushed without difficulty.  Good blood return noted with no bruising or swelling noted at site.  Stable during access and blood draw.  Patient to remain accessed for treatment. 

## 2022-09-06 NOTE — Patient Instructions (Signed)
[name]  Discharge Instructions: Thank you for choosing [facility] to provide your oncology and hematology care.  If you have a lab appointment with the [facility], please come in thru the Main Entrance and check in at the main information desk.  Wear comfortable clothing and clothing appropriate for easy access to any Portacath or PICC line.   We strive to give you quality time with your provider. You may need to reschedule your appointment if you arrive late (15 or more minutes).  Arriving late affects you and other patients whose appointments are after yours.  Also, if you miss three or more appointments without notifying the office, you may be dismissed from the clinic at the provider's discretion.      For prescription refill requests, have your pharmacy contact our office and allow 72 hours for refills to be completed.    Today you received the following chemotherapy and/or immunotherapy agents D6 Vidaza   To help prevent nausea and vomiting after your treatment, we encourage you to take your nausea medication as directed.  Azacitidine Injection What is this medication? AZACITIDINE (ay [place]) treats blood and bone marrow cancers. It works by slowing down the growth of cancer cells. This medicine may be used for other purposes; ask your health care provider or pharmacist if you have questions. COMMON BRAND NAME(S): Vidaza What should I tell my care team before I take this medication? They need to know if you have any of these conditions: Kidney disease Liver disease Low blood cell levels, such as low white cells, platelets, or red blood cells Low levels of albumin in the blood Low levels of bicarbonate in the blood An unusual or allergic reaction to azacitidine, mannitol, other medications, foods, dyes, or preservatives If you or your partner are pregnant or trying to get pregnant Breast-feeding How should I use this  medication? This medication is injected into a vein or under the skin. It is given by your care team in a hospital or clinic setting. Talk to your care team about the use of this medication in children. While it may be prescribed for children as young as 1 month for selected conditions, precautions do apply. Overdosage: If you think you have taken too much of this medicine contact a poison control center or emergency room at once. NOTE: This medicine is only for you. Do not share this medicine with others. What if I miss a dose? Keep appointments for follow-up doses. It is important not to miss your dose. Call your care team if you are unable to keep an appointment. What may interact with this medication? Interactions are not expected. This list may not describe all possible interactions. Give your health care provider a list of all the medicines, herbs, non-prescription drugs, or dietary supplements you use. Also tell them if you smoke, drink alcohol, or use illegal drugs. Some items may interact with your medicine. What should I watch for while using this medication? Your condition will be monitored carefully while you are receiving this medication. You may need blood work while taking this medication. This medication may make you feel generally unwell. This is not uncommon as chemotherapy can affect healthy cells as well as cancer cells. Report any side effects. Continue your course of treatment even though you feel ill unless your care team tells you to stop. Other product types may be available that contain the medication azacitidine. The injection and oral products should not  be used in place of one another. Talk to your care team if you have questions. This medication can cause serious side effects. To reduce the risk, your care team may give you other medications to take before receiving this one. Be sure to follow the directions from your care team. This medication may increase your risk of  getting an infection. Call your care team for advice if you get a fever, chills, sore throat, or other symptoms of a cold or flu. Do not treat yourself. Try to avoid being around people who are sick. Avoid taking medications that contain aspirin, acetaminophen, ibuprofen, naproxen, or ketoprofen unless instructed by your care team. These medications may hide a fever. This medication may increase your risk to bruise or bleed. Call your care team if you notice any unusual bleeding. Be careful brushing or flossing your teeth or using a toothpick because you may get an infection or bleed more easily. If you have any dental work done, tell your dentist you are receiving this medication. Talk to your care team if you or your partner may be pregnant. Serious birth defects can occur if you take this medication during pregnancy and for 6 months after the last dose. You will need a negative pregnancy test before starting this medication. Contraception is recommended while taking his medication and for 6 months after the last dose. Your care team can help you find the option that works for you. If your partner can get pregnant, use a condom during sex while taking this medication and for 3 months after the last dose. Do not breastfeed while taking this medication and for 1 week after the last dose. This medication may cause infertility. Talk to your care team if you are concerned about your fertility. What side effects may I notice from receiving this medication? Side effects that you should report to your care team as soon as possible: Allergic reactions--skin rash, itching, hives, swelling of the face, lips, tongue, or throat Infection--fever, chills, cough, sore throat, wounds that don't heal, pain or trouble when passing urine, general feeling of discomfort or being unwell Kidney injury--decrease in the amount of urine, swelling of the ankles, hands, or feet Liver injury--right upper belly pain, loss of  appetite, nausea, light-colored stool, dark yellow or brown urine, yellowing skin or eyes, unusual weakness or fatigue Low red blood cell level--unusual weakness or fatigue, dizziness, headache, trouble breathing Tumor lysis syndrome (TLS)--nausea, vomiting, diarrhea, decrease in the amount of urine, dark urine, unusual weakness or fatigue, confusion, muscle pain or cramps, fast or irregular heartbeat, joint pain Unusual bruising or bleeding Side effects that usually do not require medical attention (report to your care team if they continue or are bothersome): Constipation Diarrhea Nausea Pain, redness, or irritation at injection site Vomiting This list may not describe all possible side effects. Call your doctor for medical advice about side effects. You may report side effects to FDA at 1-800-FDA-1088. Where should I keep my medication? This medication is given in a hospital or clinic. It will not be stored at home. NOTE: This sheet is a summary. It may not cover all possible information. If you have questions about this medicine, talk to your doctor, pharmacist, or health care provider.  2023 Elsevier/Gold Standard (2021-11-19 00:00:00)   BELOW ARE SYMPTOMS THAT SHOULD BE REPORTED IMMEDIATELY: *FEVER GREATER THAN 100.4 F (38 C) OR HIGHER *CHILLS OR SWEATING *NAUSEA AND VOMITING THAT IS NOT CONTROLLED WITH YOUR NAUSEA MEDICATION *UNUSUAL SHORTNESS OF BREATH *UNUSUAL BRUISING  OR BLEEDING *URINARY PROBLEMS (pain or burning when urinating, or frequent urination) *BOWEL PROBLEMS (unusual diarrhea, constipation, pain near the anus) TENDERNESS IN MOUTH AND THROAT WITH OR WITHOUT PRESENCE OF ULCERS (sore throat, sores in mouth, or a toothache) UNUSUAL RASH, SWELLING OR PAIN  UNUSUAL VAGINAL DISCHARGE OR ITCHING   Items with * indicate a potential emergency and should be followed up as soon as possible or go to the Emergency Department if any problems should occur.  Please show the  CHEMOTHERAPY ALERT CARD or IMMUNOTHERAPY ALERT CARD at check-in to the Emergency Department and triage nurse.  Should you have questions after your visit or need to cancel or reschedule your appointment, please contact [facility] [phone]  and follow the prompts.  Office hours are 8:00 a.m. to 4:30 p.m. Monday - Friday. Please note that voicemails left after 4:00 p.m. may not be returned until the following business day.  We are closed weekends and major holidays. You have access to a nurse at all times for urgent questions. Please call the main number to the clinic [phone] and follow the prompts.  For any non-urgent questions, you may also contact your provider using MyChart. We now offer e-Visits for anyone 60 and older to request care online for non-urgent symptoms. For details visit [email or web address].   Also download the MyChart app! Go to the app store, search "MyChart", open the app, select [facility], and log in with your MyChart username and password.

## 2022-09-07 ENCOUNTER — Inpatient Hospital Stay: Payer: Medicare HMO

## 2022-09-07 VITALS — BP 96/61 | HR 82 | Temp 97.5°F | Resp 17

## 2022-09-07 DIAGNOSIS — R634 Abnormal weight loss: Secondary | ICD-10-CM | POA: Diagnosis not present

## 2022-09-07 DIAGNOSIS — Z95828 Presence of other vascular implants and grafts: Secondary | ICD-10-CM

## 2022-09-07 DIAGNOSIS — C9310 Chronic myelomonocytic leukemia not having achieved remission: Secondary | ICD-10-CM | POA: Diagnosis not present

## 2022-09-07 DIAGNOSIS — K5900 Constipation, unspecified: Secondary | ICD-10-CM | POA: Diagnosis not present

## 2022-09-07 DIAGNOSIS — E538 Deficiency of other specified B group vitamins: Secondary | ICD-10-CM | POA: Diagnosis not present

## 2022-09-07 DIAGNOSIS — Z5111 Encounter for antineoplastic chemotherapy: Secondary | ICD-10-CM | POA: Diagnosis not present

## 2022-09-07 DIAGNOSIS — D696 Thrombocytopenia, unspecified: Secondary | ICD-10-CM | POA: Diagnosis not present

## 2022-09-07 DIAGNOSIS — R161 Splenomegaly, not elsewhere classified: Secondary | ICD-10-CM | POA: Diagnosis not present

## 2022-09-07 DIAGNOSIS — D539 Nutritional anemia, unspecified: Secondary | ICD-10-CM | POA: Diagnosis not present

## 2022-09-07 DIAGNOSIS — J45909 Unspecified asthma, uncomplicated: Secondary | ICD-10-CM | POA: Diagnosis not present

## 2022-09-07 MED ORDER — SODIUM CHLORIDE 0.9 % IV SOLN: Freq: Once | INTRAVENOUS | Status: AC

## 2022-09-07 MED ORDER — SODIUM CHLORIDE 0.9% FLUSH
10.0000 mL | Freq: Once | INTRAVENOUS | Status: AC
  Administered 2022-09-07: 10 mL via INTRAVENOUS

## 2022-09-07 MED ORDER — HEPARIN SOD (PORK) LOCK FLUSH 100 UNIT/ML IV SOLN
500.0000 [IU] | Freq: Once | INTRAVENOUS | Status: AC
  Administered 2022-09-07: 500 [IU] via INTRAVENOUS

## 2022-09-07 MED ORDER — SODIUM CHLORIDE 0.9 % IV SOLN
75.0000 mg/m2 | Freq: Once | INTRAVENOUS | Status: AC
  Administered 2022-09-07: 126 mg via INTRAVENOUS
  Filled 2022-09-07: qty 12.6

## 2022-09-07 NOTE — Progress Notes (Signed)
Patient presents today for Vidaza per providers order.  Vital signs within parameters for treatment.  Patient has no new complaints at this time.    Treatment given today per MD orders.  Vidaza infusion without adverse affects.  Vital signs stable.  No complaints at this time.  Discharge from clinic ambulatory in stable condition.  Alert and oriented X 3.  Follow up with [facility] as scheduled.

## 2022-09-07 NOTE — Patient Instructions (Signed)
[facility]  Discharge Instructions: Thank you for choosing [facility] to provide your oncology and hematology care.  If you have a lab appointment with the Cancer Center, please come in thru the Main Entrance and check in at the main information desk.  Wear comfortable clothing and clothing appropriate for easy access to any Portacath or PICC line.   We strive to give you quality time with your provider. You may need to reschedule your appointment if you arrive late (15 or more minutes).  Arriving late affects you and other patients whose appointments are after yours.  Also, if you miss three or more appointments without notifying the office, you may be dismissed from the clinic at the provider's discretion.      For prescription refill requests, have your pharmacy contact our office and allow 72 hours for refills to be completed.    Today you received the following chemotherapy and/or immunotherapy agents Vidaza      To help prevent nausea and vomiting after your treatment, we encourage you to take your nausea medication as directed.  BELOW ARE SYMPTOMS THAT SHOULD BE REPORTED IMMEDIATELY: *FEVER GREATER THAN 100.4 F (38 C) OR HIGHER *CHILLS OR SWEATING *NAUSEA AND VOMITING THAT IS NOT CONTROLLED WITH YOUR NAUSEA MEDICATION *UNUSUAL SHORTNESS OF BREATH *UNUSUAL BRUISING OR BLEEDING *URINARY PROBLEMS (pain or burning when urinating, or frequent urination) *BOWEL PROBLEMS (unusual diarrhea, constipation, pain near the anus) TENDERNESS IN MOUTH AND THROAT WITH OR WITHOUT PRESENCE OF ULCERS (sore throat, sores in mouth, or a toothache) UNUSUAL RASH, SWELLING OR PAIN  UNUSUAL VAGINAL DISCHARGE OR ITCHING   Items with * indicate a potential emergency and should be followed up as soon as possible or go to the Emergency Department if any problems should occur.  Please show the CHEMOTHERAPY ALERT CARD or IMMUNOTHERAPY ALERT CARD at check-in to the Emergency  Department and triage nurse.  Should you have questions after your visit or need to cancel or reschedule your appointment, please contact [facility] [phone]  and follow the prompts.  Office hours are 8:00 a.m. to 4:30 p.m. Monday - Friday. Please note that voicemails left after 4:00 p.m. may not be returned until the following business day.  We are closed weekends and major holidays. You have access to a nurse at all times for urgent questions. Please call the main number to the clinic [phone] and follow the prompts.  For any non-urgent questions, you may also contact your provider using MyChart. We now offer e-Visits for anyone 18 and older to request care online for non-urgent symptoms. For details visit [email or web address].   Also download the MyChart app! Go to the app store, search "MyChart", open the app, select [facility], and log in with your MyChart username and password.   

## 2022-09-10 ENCOUNTER — Other Ambulatory Visit: Payer: Self-pay | Admitting: Hematology

## 2022-09-10 ENCOUNTER — Telehealth: Payer: Self-pay | Admitting: *Deleted

## 2022-09-10 NOTE — Telephone Encounter (Signed)
Patient has not had BM for over a week and has taken colace, MOM and lactulose.  Denies taking Hycodan for some time.  She does admit that she has only tried the lactulose a couple of times.  Educated on use of lactulose and to take every 3 hours while awake until she has a BM.  Verbalized understanding and will follow up if no results.  Dr. Delton Coombes aware.  Will not instruct to give fleets or suppository due to neutropenic status.

## 2022-09-16 ENCOUNTER — Encounter: Payer: Self-pay | Admitting: Radiology

## 2022-09-20 ENCOUNTER — Ambulatory Visit (INDEPENDENT_AMBULATORY_CARE_PROVIDER_SITE_OTHER): Payer: Medicare HMO

## 2022-09-20 DIAGNOSIS — J4550 Severe persistent asthma, uncomplicated: Secondary | ICD-10-CM | POA: Diagnosis not present

## 2022-09-21 DIAGNOSIS — C9310 Chronic myelomonocytic leukemia not having achieved remission: Secondary | ICD-10-CM | POA: Diagnosis not present

## 2022-09-22 ENCOUNTER — Other Ambulatory Visit: Payer: Self-pay

## 2022-09-22 DIAGNOSIS — C9310 Chronic myelomonocytic leukemia not having achieved remission: Secondary | ICD-10-CM

## 2022-09-26 NOTE — Patient Instructions (Signed)
[place] at [facility] Discharge Instructions   You were seen and examined today by Dr. Delton Coombes.  He reviewed the results of your lab work which are normal/stable.   He reviewed the results of your bone marrow biopsy performed at [facility]. It shows that your bone marrow has improved. The blasts in your bone marrow were previously 15%. They have decreased to 3% now.   We will proceed with your treatment today.   Return as scheduled.    Thank you for choosing [place] at [facility] to provide your oncology and hematology care.  To afford each patient quality time with our provider, please arrive at least 15 minutes before your scheduled appointment time.   If you have a lab appointment with the [place] please come in thru the Main Entrance and check in at the main information desk.  You need to re-schedule your appointment should you arrive 10 or more minutes late.  We strive to give you quality time with our providers, and arriving late affects you and other patients whose appointments are after yours.  Also, if you no show three or more times for appointments you may be dismissed from the clinic at the providers discretion.     Again, thank you for choosing [facility].  Our hope is that these requests will decrease the amount of time that you wait before being seen by our physicians.       _____________________________________________________________  Should you have questions after your visit to [facility], please contact our office at [phone] and follow the prompts.  Our office hours are 8:00 a.m. and 4:30 p.m. Monday - Friday.  Please note that voicemails left after 4:00 p.m. may not be returned until the following business day.  We are closed weekends and major holidays.  You do have access to a nurse 24-7, just call the main number to the clinic [phone] and do not press any options, hold on  the line and a nurse will answer the phone.    For prescription refill requests, have your pharmacy contact our office and allow 72 hours.    Due to Covid, you will need to wear a mask upon entering the hospital. If you do not have a mask, a mask will be given to you at the Main Entrance upon arrival. For doctor visits, patients may have 1 support person age 33 or older with them. For treatment visits, patients can not have anyone with them due to social distancing guidelines and our immunocompromised population.

## 2022-09-27 ENCOUNTER — Inpatient Hospital Stay: Payer: Medicare HMO | Attending: Hematology

## 2022-09-27 ENCOUNTER — Inpatient Hospital Stay: Payer: Medicare HMO | Admitting: Hematology

## 2022-09-27 ENCOUNTER — Inpatient Hospital Stay: Payer: Medicare HMO

## 2022-09-27 VITALS — BP 117/70 | HR 90 | Temp 97.1°F | Resp 18 | Wt 142.2 lb

## 2022-09-27 VITALS — BP 101/65 | HR 81 | Temp 98.0°F | Resp 18

## 2022-09-27 DIAGNOSIS — Z5111 Encounter for antineoplastic chemotherapy: Secondary | ICD-10-CM | POA: Insufficient documentation

## 2022-09-27 DIAGNOSIS — Z95828 Presence of other vascular implants and grafts: Secondary | ICD-10-CM

## 2022-09-27 DIAGNOSIS — R634 Abnormal weight loss: Secondary | ICD-10-CM | POA: Diagnosis not present

## 2022-09-27 DIAGNOSIS — D539 Nutritional anemia, unspecified: Secondary | ICD-10-CM | POA: Diagnosis not present

## 2022-09-27 DIAGNOSIS — Z79899 Other long term (current) drug therapy: Secondary | ICD-10-CM | POA: Insufficient documentation

## 2022-09-27 DIAGNOSIS — R110 Nausea: Secondary | ICD-10-CM | POA: Insufficient documentation

## 2022-09-27 DIAGNOSIS — D696 Thrombocytopenia, unspecified: Secondary | ICD-10-CM | POA: Insufficient documentation

## 2022-09-27 DIAGNOSIS — C9310 Chronic myelomonocytic leukemia not having achieved remission: Secondary | ICD-10-CM

## 2022-09-27 DIAGNOSIS — Z8249 Family history of ischemic heart disease and other diseases of the circulatory system: Secondary | ICD-10-CM | POA: Diagnosis not present

## 2022-09-27 DIAGNOSIS — E538 Deficiency of other specified B group vitamins: Secondary | ICD-10-CM | POA: Diagnosis not present

## 2022-09-27 DIAGNOSIS — I509 Heart failure, unspecified: Secondary | ICD-10-CM | POA: Diagnosis not present

## 2022-09-27 DIAGNOSIS — Z800 Family history of malignant neoplasm of digestive organs: Secondary | ICD-10-CM | POA: Insufficient documentation

## 2022-09-27 DIAGNOSIS — Z807 Family history of other malignant neoplasms of lymphoid, hematopoietic and related tissues: Secondary | ICD-10-CM | POA: Diagnosis not present

## 2022-09-27 DIAGNOSIS — Z821 Family history of blindness and visual loss: Secondary | ICD-10-CM | POA: Diagnosis not present

## 2022-09-27 DIAGNOSIS — Z818 Family history of other mental and behavioral disorders: Secondary | ICD-10-CM | POA: Insufficient documentation

## 2022-09-27 DIAGNOSIS — K219 Gastro-esophageal reflux disease without esophagitis: Secondary | ICD-10-CM | POA: Diagnosis not present

## 2022-09-27 DIAGNOSIS — Z825 Family history of asthma and other chronic lower respiratory diseases: Secondary | ICD-10-CM | POA: Insufficient documentation

## 2022-09-27 DIAGNOSIS — Z833 Family history of diabetes mellitus: Secondary | ICD-10-CM | POA: Diagnosis not present

## 2022-09-27 DIAGNOSIS — J45909 Unspecified asthma, uncomplicated: Secondary | ICD-10-CM | POA: Insufficient documentation

## 2022-09-27 DIAGNOSIS — R161 Splenomegaly, not elsewhere classified: Secondary | ICD-10-CM | POA: Diagnosis not present

## 2022-09-27 DIAGNOSIS — K5900 Constipation, unspecified: Secondary | ICD-10-CM | POA: Insufficient documentation

## 2022-09-27 DIAGNOSIS — Z882 Allergy status to sulfonamides status: Secondary | ICD-10-CM | POA: Insufficient documentation

## 2022-09-27 LAB — COMPREHENSIVE METABOLIC PANEL
ALT: 14 U/L (ref 0–44)
AST: 29 U/L (ref 15–41)
Albumin: 4.2 g/dL (ref 3.5–5.0)
Alkaline Phosphatase: 62 U/L (ref 38–126)
Anion gap: 6 (ref 5–15)
BUN: 14 mg/dL (ref 8–23)
CO2: 26 mmol/L (ref 22–32)
Calcium: 9.2 mg/dL (ref 8.9–10.3)
Chloride: 104 mmol/L (ref 98–111)
Creatinine, Ser: 0.83 mg/dL (ref 0.44–1.00)
GFR, Estimated: >60 mL/min (ref >60)
Glucose, Bld: 112 mg/dL — ABNORMAL HIGH (ref 70–99)
Potassium: 3.8 mmol/L (ref 3.5–5.1)
Sodium: 136 mmol/L (ref 135–145)
Total Bilirubin: 1.2 mg/dL (ref 0.3–1.2)
Total Protein: 7.1 g/dL (ref 6.5–8.1)

## 2022-09-27 LAB — CBC WITH DIFFERENTIAL/PLATELET
Abs Immature Granulocytes: 0.05 10*3/uL (ref 0.00–0.07)
Basophils Absolute: 0 10*3/uL (ref 0.0–0.1)
Basophils Relative: 1 %
Eosinophils Absolute: 0 10*3/uL (ref 0.0–0.5)
Eosinophils Relative: 1 %
HCT: 31.4 % — ABNORMAL LOW (ref 36.0–46.0)
Hemoglobin: 9.8 g/dL — ABNORMAL LOW (ref 12.0–15.0)
Immature Granulocytes: 2 %
Lymphocytes Relative: 21 %
Lymphs Abs: 0.5 10*3/uL — ABNORMAL LOW (ref 0.7–4.0)
MCH: 31.7 pg (ref 26.0–34.0)
MCHC: 31.2 g/dL (ref 30.0–36.0)
MCV: 101.6 fL — ABNORMAL HIGH (ref 80.0–100.0)
Monocytes Absolute: 0.3 10*3/uL (ref 0.1–1.0)
Monocytes Relative: 12 %
Neutro Abs: 1.5 10*3/uL — ABNORMAL LOW (ref 1.7–7.7)
Neutrophils Relative %: 63 %
Platelets: 226 10*3/uL (ref 150–400)
RBC: 3.09 MIL/uL — ABNORMAL LOW (ref 3.87–5.11)
RDW: 16.9 % — ABNORMAL HIGH (ref 11.5–15.5)
WBC: 2.3 10*3/uL — ABNORMAL LOW (ref 4.0–10.5)
nRBC: 0 % (ref 0.0–0.2)

## 2022-09-27 LAB — MAGNESIUM: Magnesium: 2.1 mg/dL (ref 1.7–2.4)

## 2022-09-27 MED ORDER — HEPARIN SOD (PORK) LOCK FLUSH 100 UNIT/ML IV SOLN
500.0000 [IU] | Freq: Once | INTRAVENOUS | Status: AC
  Administered 2022-09-27: 500 [IU] via INTRAVENOUS

## 2022-09-27 MED ORDER — SODIUM CHLORIDE 0.9 % IV SOLN
75.0000 mg/m2 | Freq: Once | INTRAVENOUS | Status: AC
  Administered 2022-09-27: 126 mg via INTRAVENOUS
  Filled 2022-09-27: qty 12.6

## 2022-09-27 MED ORDER — SODIUM CHLORIDE 0.9% FLUSH
10.0000 mL | INTRAVENOUS | Status: DC | PRN
  Administered 2022-09-27: 10 mL via INTRAVENOUS

## 2022-09-27 MED ORDER — PALONOSETRON HCL INJECTION 0.25 MG/5ML
0.2500 mg | Freq: Once | INTRAVENOUS | Status: AC
  Administered 2022-09-27: 0.25 mg via INTRAVENOUS
  Filled 2022-09-27: qty 5

## 2022-09-27 MED ORDER — SODIUM CHLORIDE 0.9 % IV SOLN: Freq: Once | INTRAVENOUS | Status: AC

## 2022-09-27 MED ORDER — SODIUM CHLORIDE 0.9% FLUSH
10.0000 mL | Freq: Once | INTRAVENOUS | Status: AC
  Administered 2022-09-27: 10 mL via INTRAVENOUS

## 2022-09-27 NOTE — Progress Notes (Signed)
[name] [address], [name] [address]    Clinic Day:  09/27/2022  Referring physician: Asencion Noble, [name]  Patient Care Team: Karen Noble, [name] as PCP - General (Internal Medicine) Karen Romney Cristopher Estimable, [name] as Attending Physician (Gastroenterology) Karen Stain, [name] as Consulting Physician (Pulmonary Disease) Karen Jack, [name] as Medical Oncologist (Hematology)   ASSESSMENT & PLAN:   Assessment: 1.  Macrocytic anemia and thrombocytopenia: - Patient seen at the request of Dr. Willey [name] for abnormal CBC. - 04/17/2022: WBC 15.8, Hb-9.7, PLT-128 - 03/24/2022: WBC-8.1 (N-43%, L-17%, M-19%, B-2%), Hb-9.6, MCV-99, PLT-115 - Smear review: 13% metamyelocytes, 2% myelocytes, 2% blasts,  - 10/19/2021: WBC-5.3 (N-61, L-21, M-10%), Hb-11, MCV-96, PLT-97 - BMBX (05/25/2022): Hypercellular bone marrow with myeloid hyperplasia with dysgranulopoiesis, erythroid hypoplasia and megakaryocytic hyperplasia with dyspoiesis.  Chromosome analysis was 67, XX.  Bone marrow blasts less than 5%.  Peripheral blood blasts less than 2%. - Mayo molecular model risk stratification: Intermediate 2 risk with at least 2 points (decreased hemoglobin less than 10, circulating immature cells).  Intermediate 2 risk with median OS 31 months. - Serum EPO level 41. - NGS: Positive for CBL, MPL, SRSF2, TET 2, RAD21 - PET scan (07/08/2022): Splenomegaly (volume 510 mm) with normal metabolic activity.  Uniform increase in marrow metabolic activity related to patient's anemia.  No lymphadenopathy. - BMBX (07/16/2022): Hypercellular bone marrow (95-100%) with myeloid hyperplasia, atypical monocytes, increased blasts (16% overall).  Atypical monocytosis present 23% of total events, expressing HLA-DR, CD38, CD4 dim, CD11C, CD13, CD14, CD36, CD64 with aberrant coexpression of CD56 and CD7. - Cycle 1 of azacitidine on 08/02/2022, cycle 2 on 08/30/2022. - BMBX (09/21/2022): 3% blasts, hypercellular marrow with markedly  increased dyspoietic megakaryocytes, increased fibrosis   2.  Social/family history: - She lives at home by herself.  Son lives next door. - Does part-time work at [employer].  Worked in [place] for 30 years prior to retirement.  Non-smoker. - Maternal aunt had tumor in the breast, patient not certain if it is cancer.  2 maternal first cousins had lymphoma.  Mother had stomach cancer.  Plan: 1.  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 - I have reviewed bone marrow biopsy results from 09/21/2022 which showed improvement in the blast count from 15%, down to 3% on the current biopsy. - I have recommended continuing azacitidine 2 more cycles and repeat the biopsy again. - We reviewed labs today which showed normal LFTs.  CBC shows white count is 2.3 with ANC of 1.5.  Platelet count is normal at 226.  Hemoglobin 9.8. - Proceed with azacitidine at the same dose for 7 days.  No need to add venetoclax.  RTC 4 weeks for follow-up.   2.  Folate deficiency: - Continue folic acid 1 mg tablet daily.   3.  Weight loss: - Appetite is improved.  Discontinue Megace.   4.  TLS prophylaxis: - Discontinue allopurinol.   5.  Constipation: - Continue stool softener with lactulose.  She is no longer taking Hycodan.  She is taking Robitussin for cough.  Orders Placed This Encounter  Procedures   CBC with Differential    Standing Status:   Future    Standing Expiration Date:   10/04/2023   Magnesium    Standing Status:   Future    Standing Expiration Date:   10/25/2023   Lactate dehydrogenase    Standing Status:   Future  Standing Expiration Date:   10/25/2023   CMP ([place] only)    Standing Status:   Future    Standing Expiration Date:   10/25/2023   CBC with Differential    Standing Status:   Future    Standing Expiration Date:   10/25/2023   CBC with Differential    Standing Status:   Future    Standing Expiration Date:    11/01/2023    I,[name],acting as a scribe for Karen Jack, [name].,have documented all relevant documentation on the behalf of Karen Jack, [name],as directed by  Karen Jack, [name] while in the presence of Karen Jack, [name].  I, [name], have reviewed the above documentation for accuracy and completeness, and I agree with the above.    Karen Jack, [name]   3/11/20245:00 PM  CHIEF COMPLAINT:   Diagnosis: CMML-2    Cancer Staging  No matching staging information was found for the patient.   Prior Therapy: None  Current Therapy:  Azacitidine 70 mg/m x 7 days every 28 days    HISTORY OF PRESENT ILLNESS:   Oncology History  CMML (chronic myelomonocytic leukemia) ([place])  06/03/2022 Initial Diagnosis   CMML (chronic myelomonocytic leukemia) ([place])   08/02/2022 -  Chemotherapy   Patient is on Treatment Plan : MYELODYSPLASIA  Azacitidine IVPB D1-7 q28d        INTERVAL HISTORY:   [name] is a 72 y.o. female presenting to clinic today for follow up of CMML-2. She was last seen by me on 08/30/22.  Today, she states that she is doing well overall. Her appetite level is at 100%. Her energy level is at 75%.  She had bone marrow biopsy done on 09/21/2022.  She is going back to work.  She has constipation controlled with lactulose.  She also has occasional nausea controlled with Compazine.   PAST MEDICAL HISTORY:   Past Medical History: Past Medical History:  Diagnosis Date   Anxiety    Asthma    had 1 episode 1 year ago-no more problem   CHF (congestive heart failure) (HCC)    swelling of feet & ankles   CMML (chronic myelomonocytic leukemia) (HCC) 06/03/2022   Cough    Depression    OCD   GERD (gastroesophageal reflux disease)    severe   OCD (obsessive compulsive disorder)    Port-A-Cath in place 07/26/2022   Shortness of breath    with exertion   Yeast infection 04/09/2014    Surgical History: Past Surgical History:   Procedure Laterality Date   CARPAL TUNNEL RELEASE  03/22/2012   Procedure: CARPAL TUNNEL RELEASE;  Surgeon: Wynonia Sours, [name];  Location: [place];  Service: Orthopedics;  Laterality: Right;  right carpal tunnel release   COLONOSCOPY  06/19/2012   Dr. Rourk:normal rectum and colon    ESOPHAGOGASTRODUODENOSCOPY N/A 12/26/2015   Dr. Gala Romney: normal    IR IMAGING GUIDED PORT INSERTION  07/30/2022   TONSILLECTOMY     TUBAL LIGATION      Social History: Social History   Socioeconomic History   Marital status: Widowed    Spouse name: Not on file   Number of children: 1   Years of education: Not on file   Highest education level: Not on file  Occupational History   Occupation: retired  Tobacco Use   Smoking status: Never    Passive exposure: Yes   Smokeless tobacco: Never  Vaping Use   Vaping Use: Never used  Substance and  Sexual Activity   Alcohol use: No   Drug use: No   Sexual activity: Yes    Birth control/protection: Surgical    Comment: tubal  Other Topics Concern   Not on file  Social History Narrative   Not on file   Social Determinants of Health   Financial Resource Strain: Not on file  Food Insecurity: Not on file  Transportation Needs: Not on file  Physical Activity: Not on file  Stress: Not on file  Social Connections: Not on file  Intimate Partner Violence: Not on file    Family History: Family History  Problem Relation Age of Onset   Asthma Mother    Macular degeneration Mother    Hypertension Mother    Stomach cancer Mother 69   Alzheimer's disease Father    Other Sister        blood clots   Diabetes Maternal Grandfather    Diabetes Paternal Grandfather    Other Son        enlarged spleen   Lymphoma Cousin        x2, both dx in their 42s   Colon cancer Neg Hx     Current Medications:  Current Outpatient Medications:    albuterol (VENTOLIN HFA) 108 (90 Base) MCG/ACT inhaler, INHALE 2 PUFFS BY MOUTH EVERY 6 HOURS AS NEEDED FOR  SHORTNESS OF BREATH OR WHEEZING., Disp: 18 g, Rfl: 2   allopurinol (ZYLOPRIM) 300 MG tablet, Take 1 tablet (300 mg total) by mouth daily., Disp: 30 tablet, Rfl: 0   azaCITIDine 5 mg/2 mLs in lactated ringers infusion, Inject into the vein daily. Days 1-7 every 28 days, Disp: , Rfl:    azelastine (ASTELIN) 0.1 % nasal spray, SPRAY 1 TO 2 SPRAYS PER NOSTRIL TWICE DAILY., Disp: 30 mL, Rfl: 3   BREZTRI AEROSPHERE 160-9-4.8 MCG/ACT AERO, INHALE 2 PUFFS BY MOUTH TWICE DAILY, Disp: 10.7 g, Rfl: 11   celecoxib (CELEBREX) 100 MG capsule, Take 2 capsules (200 mg total) by mouth 2 (two) times daily., Disp: 20 capsule, Rfl: 0   cetirizine (ZYRTEC) 5 MG tablet, Take 1 tablet (5 mg total) by mouth daily., Disp: 30 tablet, Rfl: 0   cimetidine (TAGAMET) 800 MG tablet, TAKE 1/2 TABLET BY MOUTH ONCE OR TWICE DAILY AS NEEDED, Disp: 30 tablet, Rfl: 2   dexlansoprazole (DEXILANT) 60 MG capsule, Take 1 capsule (60 mg total) by mouth daily., Disp: 30 capsule, Rfl: 5   doxycycline (VIBRAMYCIN) 100 MG capsule, Take 100 mg by mouth 2 (two) times daily., Disp: , Rfl:    EPINEPHrine 0.3 mg/0.3 mL IJ SOAJ injection, Inject 0.3 mg into the muscle as needed for anaphylaxis., Disp: 2 each, Rfl: 1   famotidine (PEPCID) 20 MG tablet, Take 20 mg by mouth 2 (two) times daily., Disp: , Rfl:    FLUoxetine (PROZAC) 20 MG capsule, Take 1 capsule by mouth daily., Disp: , Rfl:    fluticasone (FLONASE) 50 MCG/ACT nasal spray, Place 2 sprays into both nostrils 2 (two) times daily., Disp: 16 g, Rfl: 5   Fluticasone-Umeclidin-Vilant (TRELEGY ELLIPTA) 200-62.5-25 MCG/ACT AEPB, Inhale 1 puff into the lungs daily., Disp: 28 each, Rfl: 5   folic acid (FOLVITE) 1 MG tablet, TAKE ONE TABLET BY MOUTH ONCE DAILY, Disp: 30 tablet, Rfl: 5   gabapentin (NEURONTIN) 300 MG capsule, , Disp: , Rfl:    HYDROcodone bit-homatropine (HYCODAN) 5-1.5 MG/5ML syrup, TAKE ONE TEASPOONFUL (5 ml) BY MOUTH EVERY 4 HOURS, Disp: , Rfl:    ipratropium (ATROVENT) 0.06 %  nasal spray, USE 2 SPRAYS IN EACH NOSTRIL THREE TIMES A DAY AS NEEDED., Disp: 15 mL, Rfl: 11   Lactulose 20 GM/30ML SOLN, Take 30 mLs (20 g total) by mouth daily as needed. Take 30 ml by mouth every 3 hours until bowel movement then take daily as needed for constipation, Disp: 450 mL, Rfl: 2   megestrol (MEGACE) 400 MG/10ML suspension, Take 10 mLs (400 mg total) by mouth 2 (two) times daily., Disp: 480 mL, Rfl: 2   montelukast (SINGULAIR) 10 MG tablet, TAKE ONE TABLET BY MOUTH AT BEDTIME., Disp: 30 tablet, Rfl: 5   naproxen (NAPROSYN) 500 MG tablet, Take 500 mg by mouth 2 (two) times daily., Disp: , Rfl:    NON FORMULARY, [facility]  Antifungal (nail)-#1, Disp: , Rfl:    NON FORMULARY, [facility]  Anti-fungal (nail)-#1, Disp: , Rfl:    omeprazole (PRILOSEC) 20 MG capsule, Take 1 capsule (20 mg total) by mouth daily., Disp: 30 capsule, Rfl: 3   oxyCODONE-acetaminophen (PERCOCET/ROXICET) 5-325 MG tablet, Take 1 tablet by mouth every 6 (six) hours as needed for severe pain., Disp: 20 tablet, Rfl: 0   pantoprazole (PROTONIX) 40 MG tablet, Take 40 mg by mouth daily., Disp: , Rfl:    Respiratory Therapy Supplies (FLUTTER) DEVI, Use as directed, Disp: 1 each, Rfl: 3   SF 5000 PLUS 1.1 % CREA dental cream, , Disp: , Rfl:    Spacer/Aero-Holding Chambers (AEROCHAMBER PLUS WITH MASK) inhaler, 1 each by Other route See admin instructions. Use with inhaler as instructed., Disp: 1 each, Rfl: 1   traZODone (DESYREL) 50 MG tablet, Take 1 tablet (50 mg total) by mouth at bedtime., Disp: 30 tablet, Rfl: 1   triamcinolone cream (KENALOG) 0.1 %, , Disp: , Rfl:    lidocaine-prilocaine (EMLA) cream, Apply a quarter sized amount to port a cath site and cover with plastic wrap one hour prior to infusion appointments (Patient not taking: Reported on 09/27/2022), Disp: 30 g, Rfl: 3   prochlorperazine (COMPAZINE) 10 MG tablet, Take 1 tablet (10 mg total) by mouth every 6 (six) hours as needed for nausea or  vomiting. (Patient not taking: Reported on 09/27/2022), Disp: 30 tablet, Rfl: 0   prochlorperazine (COMPAZINE) 10 MG tablet, Take 1 tablet (10 mg total) by mouth every 6 (six) hours as needed for nausea or vomiting. (Patient not taking: Reported on 09/27/2022), Disp: 60 tablet, Rfl: 3  Current Facility-Administered Medications:    tezepelumab-ekko (TEZSPIRE) 210 MG/1.91ML syringe 210 mg, 210 mg, Subcutaneous, Q28 days, Kennith Gain, [name], 210 mg at 09/20/22 [id number]   Allergies: Allergies  Allergen Reactions   Levonorgestrel-Ethinyl Estrad Cough   Sulfa Antibiotics Other (See Comments)    Unknown- pt unsure of reaction; believes it may be nausea   Sulfamethoxazole-Trimethoprim Other (See Comments)   Cephalosporins Other (See Comments)    Other Reaction: Toxicity    REVIEW OF SYSTEMS:   Review of Systems  Constitutional:  Negative for chills, fatigue and fever.  HENT:   Negative for lump/mass, mouth sores, nosebleeds, sore throat and trouble swallowing.   Eyes:  Negative for eye problems.  Respiratory:  Positive for cough and shortness of breath.   Cardiovascular:  Negative for chest pain, leg swelling and palpitations.  Gastrointestinal:  Positive for constipation and nausea. Negative for abdominal pain, diarrhea and vomiting.  Genitourinary:  Negative for bladder incontinence, difficulty urinating, dysuria, frequency, hematuria and nocturia.   Musculoskeletal:  Negative for arthralgias, back pain, flank pain, myalgias and neck pain.  Skin:  Negative for itching and rash.  Neurological:  Negative for dizziness, headaches and numbness.  Hematological:  Does not bruise/bleed easily.  Psychiatric/Behavioral:  Negative for depression, sleep disturbance and suicidal ideas. The patient is not nervous/anxious.   All other systems reviewed and are negative.    VITALS:   There were no vitals taken for this visit.  Wt Readings from Last 3 Encounters:  09/27/22 142 lb 3.2 oz (64.5  kg)  09/06/22 145 lb (65.8 kg)  08/30/22 145 lb 9.6 oz (66 kg)    There is no height or weight on file to calculate BMI.  Performance status (ECOG): 1 - Symptomatic but completely ambulatory  PHYSICAL EXAM:   Physical Exam Vitals and nursing note reviewed. Exam conducted with a chaperone present.  Constitutional:      Appearance: Normal appearance.  Cardiovascular:     Rate and Rhythm: Normal rate and regular rhythm.     Pulses: Normal pulses.     Heart sounds: Normal heart sounds.  Pulmonary:     Effort: Pulmonary effort is normal.     Breath sounds: Normal breath sounds.  Abdominal:     Palpations: Abdomen is soft. There is no hepatomegaly, splenomegaly or mass.     Tenderness: There is no abdominal tenderness.  Musculoskeletal:     Right lower leg: No edema.     Left lower leg: No edema.  Lymphadenopathy:     Cervical: No cervical adenopathy.     Right cervical: No superficial, deep or posterior cervical adenopathy.    Left cervical: No superficial, deep or posterior cervical adenopathy.     Upper Body:     Right upper body: No supraclavicular or axillary adenopathy.     Left upper body: No supraclavicular or axillary adenopathy.  Neurological:     General: No focal deficit present.     Mental Status: She is alert and oriented to person, place, and time.  Psychiatric:        Mood and Affect: Mood normal.        Behavior: Behavior normal.     LABS:      Latest Ref Rng & Units 09/27/2022   10:28 AM 09/06/2022   11:44 AM 08/30/2022   11:52 AM  CBC  WBC 4.0 - 10.5 K/uL 2.3  2.1  3.0   Hemoglobin 12.0 - 15.0 g/dL 9.8  9.0  9.3   Hematocrit 36.0 - 46.0 % 31.4  28.9  30.5   Platelets 150 - 400 K/uL 226  98  225       Latest Ref Rng & Units 09/27/2022   10:28 AM 09/06/2022   11:44 AM 08/30/2022   11:52 AM  CMP  Glucose 70 - 99 mg/dL 112  92  94   BUN 8 - 23 mg/dL '14  17  20   '$ Creatinine 0.44 - 1.00 mg/dL 0.83  0.97  1.04   Sodium 135 - 145 mmol/L 136  137  136    Potassium 3.5 - 5.1 mmol/L 3.8  4.2  4.1   Chloride 98 - 111 mmol/L 104  106  106   CO2 22 - 32 mmol/L '26  25  24   '$ Calcium 8.9 - 10.3 mg/dL 9.2  9.3  9.1   Total Protein 6.5 - 8.1 g/dL 7.1  7.0  6.6   Total Bilirubin 0.3 - 1.2 mg/dL 1.2  1.3  0.7   Alkaline Phos 38 - 126 U/L 62  47  36  AST 15 - 41 U/L '29  19  22   '$ ALT 0 - 44 U/L '14  14  13      '$ No results found for: "CEA1", "CEA" / No results found for: "CEA1", "CEA" No results found for: "PSA1" No results found for: "[id number]" No results found for: "CAN125"  Lab Results  Component Value Date   TOTALPROTELP 6.3 04/26/2022   ALBUMINELP 3.8 04/26/2022   A1GS 0.3 04/26/2022   A2GS 0.5 04/26/2022   BETS 0.8 04/26/2022   GAMS 0.9 04/26/2022   MSPIKE Not Observed 04/26/2022   SPEI Comment 04/26/2022   Lab Results  Component Value Date   TIBC 343 04/26/2022   FERRITIN 114 04/26/2022   IRONPCTSAT 25 04/26/2022   Lab Results  Component Value Date   LDH 305 (H) 08/30/2022   LDH 420 (H) 07/08/2022   LDH 416 (H) 06/04/2022     STUDIES:   No results found.

## 2022-09-27 NOTE — Progress Notes (Signed)
Patient presents today for Vidaza and follow up visit with Dr. Delton Coombes. Vital signs within parameters for treatment. Labs pending.    Labs within parameters for treatment.   Treatment given today per MD orders. Tolerated infusion without adverse affects. Vital signs stable. No complaints at this time. Discharged from clinic ambulatory in stable condition. Alert and oriented x 3. F/U with [facility] as scheduled.

## 2022-09-27 NOTE — Progress Notes (Signed)
Patients port flushed without difficulty.  Good blood return noted with no bruising or swelling noted at site.  Patient remains accessed for chemotherapy treatment.  

## 2022-09-28 ENCOUNTER — Inpatient Hospital Stay: Payer: Medicare HMO

## 2022-09-28 VITALS — BP 95/61 | HR 83 | Temp 98.2°F | Resp 18

## 2022-09-28 DIAGNOSIS — E538 Deficiency of other specified B group vitamins: Secondary | ICD-10-CM | POA: Diagnosis not present

## 2022-09-28 DIAGNOSIS — D539 Nutritional anemia, unspecified: Secondary | ICD-10-CM | POA: Diagnosis not present

## 2022-09-28 DIAGNOSIS — K5900 Constipation, unspecified: Secondary | ICD-10-CM | POA: Diagnosis not present

## 2022-09-28 DIAGNOSIS — C9310 Chronic myelomonocytic leukemia not having achieved remission: Secondary | ICD-10-CM | POA: Diagnosis not present

## 2022-09-28 DIAGNOSIS — Z95828 Presence of other vascular implants and grafts: Secondary | ICD-10-CM

## 2022-09-28 DIAGNOSIS — R110 Nausea: Secondary | ICD-10-CM | POA: Diagnosis not present

## 2022-09-28 DIAGNOSIS — R161 Splenomegaly, not elsewhere classified: Secondary | ICD-10-CM | POA: Diagnosis not present

## 2022-09-28 DIAGNOSIS — D696 Thrombocytopenia, unspecified: Secondary | ICD-10-CM | POA: Diagnosis not present

## 2022-09-28 DIAGNOSIS — Z5111 Encounter for antineoplastic chemotherapy: Secondary | ICD-10-CM | POA: Diagnosis not present

## 2022-09-28 DIAGNOSIS — R634 Abnormal weight loss: Secondary | ICD-10-CM | POA: Diagnosis not present

## 2022-09-28 MED ORDER — SODIUM CHLORIDE 0.9 % IV SOLN
75.0000 mg/m2 | Freq: Once | INTRAVENOUS | Status: AC
  Administered 2022-09-28: 126 mg via INTRAVENOUS
  Filled 2022-09-28: qty 12.6

## 2022-09-28 MED ORDER — SODIUM CHLORIDE 0.9% FLUSH
10.0000 mL | Freq: Once | INTRAVENOUS | Status: AC
  Administered 2022-09-28: 10 mL via INTRAVENOUS

## 2022-09-28 MED ORDER — SODIUM CHLORIDE 0.9 % IV SOLN: Freq: Once | INTRAVENOUS | Status: AC

## 2022-09-28 MED ORDER — HEPARIN SOD (PORK) LOCK FLUSH 100 UNIT/ML IV SOLN
500.0000 [IU] | Freq: Once | INTRAVENOUS | Status: AC
  Administered 2022-09-28: 500 [IU] via INTRAVENOUS

## 2022-09-28 NOTE — Patient Instructions (Signed)
[facility] [place]  Discharge Instructions: Thank you for choosing [facility] to provide your oncology and hematology care.  If you have a lab appointment with the Cancer Center, please come in thru the Main Entrance and check in at the main information desk.  Wear comfortable clothing and clothing appropriate for easy access to any Portacath or PICC line.   We strive to give you quality time with your provider. You may need to reschedule your appointment if you arrive late (15 or more minutes).  Arriving late affects you and other patients whose appointments are after yours.  Also, if you miss three or more appointments without notifying the office, you may be dismissed from the clinic at the provider's discretion.      For prescription refill requests, have your pharmacy contact our office and allow 72 hours for refills to be completed.    Today you received the following chemotherapy and/or immunotherapy agents Vidaza      To help prevent nausea and vomiting after your treatment, we encourage you to take your nausea medication as directed.  BELOW ARE SYMPTOMS THAT SHOULD BE REPORTED IMMEDIATELY: *FEVER GREATER THAN 100.4 F (38 C) OR HIGHER *CHILLS OR SWEATING *NAUSEA AND VOMITING THAT IS NOT CONTROLLED WITH YOUR NAUSEA MEDICATION *UNUSUAL SHORTNESS OF BREATH *UNUSUAL BRUISING OR BLEEDING *URINARY PROBLEMS (pain or burning when urinating, or frequent urination) *BOWEL PROBLEMS (unusual diarrhea, constipation, pain near the anus) TENDERNESS IN MOUTH AND THROAT WITH OR WITHOUT PRESENCE OF ULCERS (sore throat, sores in mouth, or a toothache) UNUSUAL RASH, SWELLING OR PAIN  UNUSUAL VAGINAL DISCHARGE OR ITCHING   Items with * indicate a potential emergency and should be followed up as soon as possible or go to the Emergency Department if any problems should occur.  Please show the CHEMOTHERAPY ALERT CARD or IMMUNOTHERAPY ALERT CARD at check-in to the Emergency  Department and triage nurse.  Should you have questions after your visit or need to cancel or reschedule your appointment, please contact [facility] [phone]  and follow the prompts.  Office hours are 8:00 a.m. to 4:30 p.m. Monday - Friday. Please note that voicemails left after 4:00 p.m. may not be returned until the following business day.  We are closed weekends and major holidays. You have access to a nurse at all times for urgent questions. Please call the main number to the clinic [phone] and follow the prompts.  For any non-urgent questions, you may also contact your provider using MyChart. We now offer e-Visits for anyone 18 and older to request care online for non-urgent symptoms. For details visit [email or web address].   Also download the MyChart app! Go to the app store, search "MyChart", open the app, select [facility], and log in with your MyChart username and password.   

## 2022-09-28 NOTE — Progress Notes (Signed)
Patient presents today for Vidaza infusion per providers order.  Vital signs within parameters for treatment.  Patient has no new complaints at this time.  Treatment given today per MD orders.  Stable during infusion without adverse affects.  Vital signs stable.  No complaints at this time.  Discharge from clinic ambulatory in stable condition.  Alert and oriented X 3.  Follow up with [facility] as scheduled.  

## 2022-09-29 ENCOUNTER — Inpatient Hospital Stay: Payer: Medicare HMO

## 2022-09-29 VITALS — BP 101/63 | HR 79 | Temp 97.4°F | Resp 18

## 2022-09-29 DIAGNOSIS — C9310 Chronic myelomonocytic leukemia not having achieved remission: Secondary | ICD-10-CM

## 2022-09-29 DIAGNOSIS — R161 Splenomegaly, not elsewhere classified: Secondary | ICD-10-CM | POA: Diagnosis not present

## 2022-09-29 DIAGNOSIS — Z95828 Presence of other vascular implants and grafts: Secondary | ICD-10-CM

## 2022-09-29 DIAGNOSIS — R634 Abnormal weight loss: Secondary | ICD-10-CM | POA: Diagnosis not present

## 2022-09-29 DIAGNOSIS — D539 Nutritional anemia, unspecified: Secondary | ICD-10-CM | POA: Diagnosis not present

## 2022-09-29 DIAGNOSIS — R110 Nausea: Secondary | ICD-10-CM | POA: Diagnosis not present

## 2022-09-29 DIAGNOSIS — K5900 Constipation, unspecified: Secondary | ICD-10-CM | POA: Diagnosis not present

## 2022-09-29 DIAGNOSIS — D696 Thrombocytopenia, unspecified: Secondary | ICD-10-CM | POA: Diagnosis not present

## 2022-09-29 DIAGNOSIS — E538 Deficiency of other specified B group vitamins: Secondary | ICD-10-CM | POA: Diagnosis not present

## 2022-09-29 DIAGNOSIS — Z5111 Encounter for antineoplastic chemotherapy: Secondary | ICD-10-CM | POA: Diagnosis not present

## 2022-09-29 MED ORDER — PALONOSETRON HCL INJECTION 0.25 MG/5ML
0.2500 mg | Freq: Once | INTRAVENOUS | Status: AC
  Administered 2022-09-29: 0.25 mg via INTRAVENOUS
  Filled 2022-09-29: qty 5

## 2022-09-29 MED ORDER — HEPARIN SOD (PORK) LOCK FLUSH 100 UNIT/ML IV SOLN
500.0000 [IU] | Freq: Once | INTRAVENOUS | Status: AC
  Administered 2022-09-29: 500 [IU] via INTRAVENOUS

## 2022-09-29 MED ORDER — SODIUM CHLORIDE 0.9 % IV SOLN: Freq: Once | INTRAVENOUS | Status: AC

## 2022-09-29 MED ORDER — SODIUM CHLORIDE 0.9% FLUSH
10.0000 mL | INTRAVENOUS | Status: DC | PRN
  Administered 2022-09-29: 10 mL via INTRAVENOUS

## 2022-09-29 MED ORDER — SODIUM CHLORIDE 0.9 % IV SOLN
75.0000 mg/m2 | Freq: Once | INTRAVENOUS | Status: AC
  Administered 2022-09-29: 126 mg via INTRAVENOUS
  Filled 2022-09-29: qty 12.6

## 2022-09-29 NOTE — Progress Notes (Signed)
Patient presents today for Vidaza infusion.  Patient is in satisfactory condition with complaints of headaches last night.  Headaches resolved with Tylenol.  Patient did state that she has sinus/allergy headaches this time of year.  She states that she is drinking plenty.  I advised patient to let us know if symptoms persisted or worsened.  Vital signs are stable.  We will proceed with treatment per MD orders.   Patient tolerated treatment well with no complaints voiced.  Patient left ambulatory in stable condition.  Vital signs stable at discharge.  Follow up as scheduled.

## 2022-09-29 NOTE — Patient Instructions (Signed)
[facility]  Discharge Instructions: Thank you for choosing [facility] to provide your oncology and hematology care.  If you have a lab appointment with the Cancer Center, please come in thru the Main Entrance and check in at the main information desk.  Wear comfortable clothing and clothing appropriate for easy access to any Portacath or PICC line.   We strive to give you quality time with your provider. You may need to reschedule your appointment if you arrive late (15 or more minutes).  Arriving late affects you and other patients whose appointments are after yours.  Also, if you miss three or more appointments without notifying the office, you may be dismissed from the clinic at the provider's discretion.      For prescription refill requests, have your pharmacy contact our office and allow 72 hours for refills to be completed.    Today you received the following chemotherapy and/or immunotherapy agents Vidaza. Azacitidine Injection What is this medication? AZACITIDINE (ay za SITE i deen) treats blood and bone marrow cancers. It works by slowing down the growth of cancer cells. This medicine may be used for other purposes; ask your health care provider or pharmacist if you have questions. COMMON BRAND NAME(S): Vidaza What should I tell my care team before I take this medication? They need to know if you have any of these conditions: Kidney disease Liver disease Low blood cell levels, such as low white cells, platelets, or red blood cells Low levels of albumin in the blood Low levels of bicarbonate in the blood An unusual or allergic reaction to azacitidine, mannitol, other medications, foods, dyes, or preservatives If you or your partner are pregnant or trying to get pregnant Breast-feeding How should I use this medication? This medication is injected into a vein or under the skin. It is given by your care team in a hospital or clinic setting. Talk  to your care team about the use of this medication in children. While it may be prescribed for children as young as 1 month for selected conditions, precautions do apply. Overdosage: If you think you have taken too much of this medicine contact a poison control center or emergency room at once. NOTE: This medicine is only for you. Do not share this medicine with others. What if I miss a dose? Keep appointments for follow-up doses. It is important not to miss your dose. Call your care team if you are unable to keep an appointment. What may interact with this medication? Interactions are not expected. This list may not describe all possible interactions. Give your health care provider a list of all the medicines, herbs, non-prescription drugs, or dietary supplements you use. Also tell them if you smoke, drink alcohol, or use illegal drugs. Some items may interact with your medicine. What should I watch for while using this medication? Your condition will be monitored carefully while you are receiving this medication. You may need blood work while taking this medication. This medication may make you feel generally unwell. This is not uncommon as chemotherapy can affect healthy cells as well as cancer cells. Report any side effects. Continue your course of treatment even though you feel ill unless your care team tells you to stop. Other product types may be available that contain the medication azacitidine. The injection and oral products should not be used in place of one another. Talk to your care team if you have questions. This medication can cause serious side effects.   To reduce the risk, your care team may give you other medications to take before receiving this one. Be sure to follow the directions from your care team. This medication may increase your risk of getting an infection. Call your care team for advice if you get a fever, chills, sore throat, or other symptoms of a cold or flu. Do not treat  yourself. Try to avoid being around people who are sick. Avoid taking medications that contain aspirin, acetaminophen, ibuprofen, naproxen, or ketoprofen unless instructed by your care team. These medications may hide a fever. This medication may increase your risk to bruise or bleed. Call your care team if you notice any unusual bleeding. Be careful brushing or flossing your teeth or using a toothpick because you may get an infection or bleed more easily. If you have any dental work done, tell your dentist you are receiving this medication. Talk to your care team if you or your partner may be pregnant. Serious birth defects can occur if you take this medication during pregnancy and for 6 months after the last dose. You will need a negative pregnancy test before starting this medication. Contraception is recommended while taking his medication and for 6 months after the last dose. Your care team can help you find the option that works for you. If your partner can get pregnant, use a condom during sex while taking this medication and for 3 months after the last dose. Do not breastfeed while taking this medication and for 1 week after the last dose. This medication may cause infertility. Talk to your care team if you are concerned about your fertility. What side effects may I notice from receiving this medication? Side effects that you should report to your care team as soon as possible: Allergic reactions--skin rash, itching, hives, swelling of the face, lips, tongue, or throat Infection--fever, chills, cough, sore throat, wounds that don't heal, pain or trouble when passing urine, general feeling of discomfort or being unwell Kidney injury--decrease in the amount of urine, swelling of the ankles, hands, or feet Liver injury--right upper belly pain, loss of appetite, nausea, light-colored stool, dark yellow or [place] urine, yellowing skin or eyes, unusual weakness or fatigue Low red blood cell  level--unusual weakness or fatigue, dizziness, headache, trouble breathing Tumor lysis syndrome (TLS)--nausea, vomiting, diarrhea, decrease in the amount of urine, dark urine, unusual weakness or fatigue, confusion, muscle pain or cramps, fast or irregular heartbeat, joint pain Unusual bruising or bleeding Side effects that usually do not require medical attention (report to your care team if they continue or are bothersome): Constipation Diarrhea Nausea Pain, redness, or irritation at injection site Vomiting This list may not describe all possible side effects. Call your doctor for medical advice about side effects. You may report side effects to FDA at 1-800-FDA-1088. Where should I keep my medication? This medication is given in a hospital or clinic. It will not be stored at home. NOTE: This sheet is a summary. It may not cover all possible information. If you have questions about this medicine, talk to your doctor, pharmacist, or health care provider.  2023 Elsevier/Gold Standard (2021-11-19 00:00:00)       To help prevent nausea and vomiting after your treatment, we encourage you to take your nausea medication as directed.  BELOW ARE SYMPTOMS THAT SHOULD BE REPORTED IMMEDIATELY: *FEVER GREATER THAN 100.4 F (38 C) OR HIGHER *CHILLS OR SWEATING *NAUSEA AND VOMITING THAT IS NOT CONTROLLED WITH YOUR NAUSEA MEDICATION *UNUSUAL SHORTNESS OF BREATH *UNUSUAL   BRUISING OR BLEEDING *URINARY PROBLEMS (pain or burning when urinating, or frequent urination) *BOWEL PROBLEMS (unusual diarrhea, constipation, pain near the anus) TENDERNESS IN MOUTH AND THROAT WITH OR WITHOUT PRESENCE OF ULCERS (sore throat, sores in mouth, or a toothache) UNUSUAL RASH, SWELLING OR PAIN  UNUSUAL VAGINAL DISCHARGE OR ITCHING   Items with * indicate a potential emergency and should be followed up as soon as possible or go to the Emergency Department if any problems should occur.  Please show the CHEMOTHERAPY ALERT  CARD or IMMUNOTHERAPY ALERT CARD at check-in to the Emergency Department and triage nurse.  Should you have questions after your visit or need to cancel or reschedule your appointment, please contact [facility] [phone]  and follow the prompts.  Office hours are 8:00 a.m. to 4:30 p.m. Monday - Friday. Please note that voicemails left after 4:00 p.m. may not be returned until the following business day.  We are closed weekends and major holidays. You have access to a nurse at all times for urgent questions. Please call the main number to the clinic [phone] and follow the prompts.  For any non-urgent questions, you may also contact your provider using MyChart. We now offer e-Visits for anyone 18 and older to request care online for non-urgent symptoms. For details visit [email or web address].   Also download the MyChart app! Go to the app store, search "MyChart", open the app, select [facility], and log in with your MyChart username and password.   

## 2022-09-30 ENCOUNTER — Inpatient Hospital Stay: Payer: Medicare HMO

## 2022-09-30 VITALS — BP 108/68 | HR 81 | Temp 98.1°F | Resp 18

## 2022-09-30 DIAGNOSIS — C9310 Chronic myelomonocytic leukemia not having achieved remission: Secondary | ICD-10-CM | POA: Diagnosis not present

## 2022-09-30 DIAGNOSIS — R110 Nausea: Secondary | ICD-10-CM | POA: Diagnosis not present

## 2022-09-30 DIAGNOSIS — D539 Nutritional anemia, unspecified: Secondary | ICD-10-CM | POA: Diagnosis not present

## 2022-09-30 DIAGNOSIS — K5900 Constipation, unspecified: Secondary | ICD-10-CM | POA: Diagnosis not present

## 2022-09-30 DIAGNOSIS — D696 Thrombocytopenia, unspecified: Secondary | ICD-10-CM | POA: Diagnosis not present

## 2022-09-30 DIAGNOSIS — E538 Deficiency of other specified B group vitamins: Secondary | ICD-10-CM | POA: Diagnosis not present

## 2022-09-30 DIAGNOSIS — Z95828 Presence of other vascular implants and grafts: Secondary | ICD-10-CM

## 2022-09-30 DIAGNOSIS — R634 Abnormal weight loss: Secondary | ICD-10-CM | POA: Diagnosis not present

## 2022-09-30 DIAGNOSIS — Z5111 Encounter for antineoplastic chemotherapy: Secondary | ICD-10-CM | POA: Diagnosis not present

## 2022-09-30 DIAGNOSIS — R161 Splenomegaly, not elsewhere classified: Secondary | ICD-10-CM | POA: Diagnosis not present

## 2022-09-30 MED ORDER — SODIUM CHLORIDE 0.9 % IV SOLN: Freq: Once | INTRAVENOUS | Status: AC

## 2022-09-30 MED ORDER — HEPARIN SOD (PORK) LOCK FLUSH 100 UNIT/ML IV SOLN
500.0000 [IU] | Freq: Once | INTRAVENOUS | Status: AC
  Administered 2022-09-30: 500 [IU] via INTRAVENOUS

## 2022-09-30 MED ORDER — SODIUM CHLORIDE 0.9% FLUSH
10.0000 mL | Freq: Once | INTRAVENOUS | Status: AC
  Administered 2022-09-30: 10 mL via INTRAVENOUS

## 2022-09-30 MED ORDER — SODIUM CHLORIDE 0.9 % IV SOLN
75.0000 mg/m2 | Freq: Once | INTRAVENOUS | Status: AC
  Administered 2022-09-30: 126 mg via INTRAVENOUS
  Filled 2022-09-30: qty 12.6

## 2022-09-30 NOTE — Progress Notes (Signed)
Pt presents today for D4 Vidaza per provider's order. Vital signs stable and pt voiced no new complaints at this time.  D4 Vidaza given today per MD orders. Tolerated infusion without adverse affects. Vital signs stable. No complaints at this time. Discharged from clinic ambulatory in stable condition. Alert and oriented x 3. F/U with [facility] as scheduled.   

## 2022-09-30 NOTE — Patient Instructions (Signed)
[facility]   Discharge Instructions: Thank you for choosing [facility] to provide your oncology and hematology care.  If you have a lab appointment with the Cancer Center, please come in thru the Main Entrance and check in at the main information desk.  Wear comfortable clothing and clothing appropriate for easy access to any Portacath or PICC line.   We strive to give you quality time with your provider. You may need to reschedule your appointment if you arrive late (15 or more minutes).  Arriving late affects you and other patients whose appointments are after yours.  Also, if you miss three or more appointments without notifying the office, you may be dismissed from the clinic at the provider's discretion.      For prescription refill requests, have your pharmacy contact our office and allow 72 hours for refills to be completed.    Today you received the following chemotherapy and/or immunotherapy agents D4 Vidaza   To help prevent nausea and vomiting after your treatment, we encourage you to take your nausea medication as directed.  Azacitidine Injection What is this medication? AZACITIDINE (ay za SITE i deen) treats blood and bone marrow cancers. It works by slowing down the growth of cancer cells. This medicine may be used for other purposes; ask your health care provider or pharmacist if you have questions. COMMON BRAND NAME(S): Vidaza What should I tell my care team before I take this medication? They need to know if you have any of these conditions: Kidney disease Liver disease Low blood cell levels, such as low white cells, platelets, or red blood cells Low levels of albumin in the blood Low levels of bicarbonate in the blood An unusual or allergic reaction to azacitidine, mannitol, other medications, foods, dyes, or preservatives If you or your partner are pregnant or trying to get pregnant Breast-feeding How should I use this  medication? This medication is injected into a vein or under the skin. It is given by your care team in a hospital or clinic setting. Talk to your care team about the use of this medication in children. While it may be prescribed for children as young as 1 month for selected conditions, precautions do apply. Overdosage: If you think you have taken too much of this medicine contact a poison control center or emergency room at once. NOTE: This medicine is only for you. Do not share this medicine with others. What if I miss a dose? Keep appointments for follow-up doses. It is important not to miss your dose. Call your care team if you are unable to keep an appointment. What may interact with this medication? Interactions are not expected. This list may not describe all possible interactions. Give your health care provider a list of all the medicines, herbs, non-prescription drugs, or dietary supplements you use. Also tell them if you smoke, drink alcohol, or use illegal drugs. Some items may interact with your medicine. What should I watch for while using this medication? Your condition will be monitored carefully while you are receiving this medication. You may need blood work while taking this medication. This medication may make you feel generally unwell. This is not uncommon as chemotherapy can affect healthy cells as well as cancer cells. Report any side effects. Continue your course of treatment even though you feel ill unless your care team tells you to stop. Other product types may be available that contain the medication azacitidine. The injection and oral products should not   be used in place of one another. Talk to your care team if you have questions. This medication can cause serious side effects. To reduce the risk, your care team may give you other medications to take before receiving this one. Be sure to follow the directions from your care team. This medication may increase your risk of  getting an infection. Call your care team for advice if you get a fever, chills, sore throat, or other symptoms of a cold or flu. Do not treat yourself. Try to avoid being around people who are sick. Avoid taking medications that contain aspirin, acetaminophen, ibuprofen, naproxen, or ketoprofen unless instructed by your care team. These medications may hide a fever. This medication may increase your risk to bruise or bleed. Call your care team if you notice any unusual bleeding. Be careful brushing or flossing your teeth or using a toothpick because you may get an infection or bleed more easily. If you have any dental work done, tell your dentist you are receiving this medication. Talk to your care team if you or your partner may be pregnant. Serious birth defects can occur if you take this medication during pregnancy and for 6 months after the last dose. You will need a negative pregnancy test before starting this medication. Contraception is recommended while taking his medication and for 6 months after the last dose. Your care team can help you find the option that works for you. If your partner can get pregnant, use a condom during sex while taking this medication and for 3 months after the last dose. Do not breastfeed while taking this medication and for 1 week after the last dose. This medication may cause infertility. Talk to your care team if you are concerned about your fertility. What side effects may I notice from receiving this medication? Side effects that you should report to your care team as soon as possible: Allergic reactions--skin rash, itching, hives, swelling of the face, lips, tongue, or throat Infection--fever, chills, cough, sore throat, wounds that don't heal, pain or trouble when passing urine, general feeling of discomfort or being unwell Kidney injury--decrease in the amount of urine, swelling of the ankles, hands, or feet Liver injury--right upper belly pain, loss of  appetite, nausea, light-colored stool, dark yellow or brown urine, yellowing skin or eyes, unusual weakness or fatigue Low red blood cell level--unusual weakness or fatigue, dizziness, headache, trouble breathing Tumor lysis syndrome (TLS)--nausea, vomiting, diarrhea, decrease in the amount of urine, dark urine, unusual weakness or fatigue, confusion, muscle pain or cramps, fast or irregular heartbeat, joint pain Unusual bruising or bleeding Side effects that usually do not require medical attention (report to your care team if they continue or are bothersome): Constipation Diarrhea Nausea Pain, redness, or irritation at injection site Vomiting This list may not describe all possible side effects. Call your doctor for medical advice about side effects. You may report side effects to FDA at 1-800-FDA-1088. Where should I keep my medication? This medication is given in a hospital or clinic. It will not be stored at home. NOTE: This sheet is a summary. It may not cover all possible information. If you have questions about this medicine, talk to your doctor, pharmacist, or health care provider.  2023 Elsevier/Gold Standard (2021-11-19 00:00:00)   BELOW ARE SYMPTOMS THAT SHOULD BE REPORTED IMMEDIATELY: *FEVER GREATER THAN 100.4 F (38 C) OR HIGHER *CHILLS OR SWEATING *NAUSEA AND VOMITING THAT IS NOT CONTROLLED WITH YOUR NAUSEA MEDICATION *UNUSUAL SHORTNESS OF BREATH *UNUSUAL BRUISING   OR BLEEDING *URINARY PROBLEMS (pain or burning when urinating, or frequent urination) *BOWEL PROBLEMS (unusual diarrhea, constipation, pain near the anus) TENDERNESS IN MOUTH AND THROAT WITH OR WITHOUT PRESENCE OF ULCERS (sore throat, sores in mouth, or a toothache) UNUSUAL RASH, SWELLING OR PAIN  UNUSUAL VAGINAL DISCHARGE OR ITCHING   Items with * indicate a potential emergency and should be followed up as soon as possible or go to the Emergency Department if any problems should occur.  Please show the  CHEMOTHERAPY ALERT CARD or IMMUNOTHERAPY ALERT CARD at check-in to the Emergency Department and triage nurse.  Should you have questions after your visit or need to cancel or reschedule your appointment, please contact [facility] [phone]  and follow the prompts.  Office hours are 8:00 a.m. to 4:30 p.m. Monday - Friday. Please note that voicemails left after 4:00 p.m. may not be returned until the following business day.  We are closed weekends and major holidays. You have access to a nurse at all times for urgent questions. Please call the main number to the clinic [phone] and follow the prompts.  For any non-urgent questions, you may also contact your provider using MyChart. We now offer e-Visits for anyone 18 and older to request care online for non-urgent symptoms. For details visit [email or web address].   Also download the MyChart app! Go to the app store, search "MyChart", open the app, select [facility], and log in with your MyChart username and password.   

## 2022-10-01 ENCOUNTER — Inpatient Hospital Stay: Payer: Medicare HMO

## 2022-10-01 VITALS — BP 96/64 | HR 78 | Temp 97.1°F | Resp 18

## 2022-10-01 DIAGNOSIS — C9310 Chronic myelomonocytic leukemia not having achieved remission: Secondary | ICD-10-CM

## 2022-10-01 DIAGNOSIS — Z95828 Presence of other vascular implants and grafts: Secondary | ICD-10-CM

## 2022-10-01 DIAGNOSIS — K5900 Constipation, unspecified: Secondary | ICD-10-CM | POA: Diagnosis not present

## 2022-10-01 DIAGNOSIS — R634 Abnormal weight loss: Secondary | ICD-10-CM | POA: Diagnosis not present

## 2022-10-01 DIAGNOSIS — E538 Deficiency of other specified B group vitamins: Secondary | ICD-10-CM | POA: Diagnosis not present

## 2022-10-01 DIAGNOSIS — R110 Nausea: Secondary | ICD-10-CM | POA: Diagnosis not present

## 2022-10-01 DIAGNOSIS — R161 Splenomegaly, not elsewhere classified: Secondary | ICD-10-CM | POA: Diagnosis not present

## 2022-10-01 DIAGNOSIS — Z5111 Encounter for antineoplastic chemotherapy: Secondary | ICD-10-CM | POA: Diagnosis not present

## 2022-10-01 DIAGNOSIS — D696 Thrombocytopenia, unspecified: Secondary | ICD-10-CM | POA: Diagnosis not present

## 2022-10-01 DIAGNOSIS — D539 Nutritional anemia, unspecified: Secondary | ICD-10-CM | POA: Diagnosis not present

## 2022-10-01 MED ORDER — HEPARIN SOD (PORK) LOCK FLUSH 100 UNIT/ML IV SOLN
500.0000 [IU] | Freq: Once | INTRAVENOUS | Status: AC
  Administered 2022-10-01: 500 [IU] via INTRAVENOUS

## 2022-10-01 MED ORDER — SODIUM CHLORIDE 0.9 % IV SOLN: Freq: Once | INTRAVENOUS | Status: AC

## 2022-10-01 MED ORDER — SODIUM CHLORIDE 0.9 % IV SOLN
75.0000 mg/m2 | Freq: Once | INTRAVENOUS | Status: AC
  Administered 2022-10-01: 126 mg via INTRAVENOUS
  Filled 2022-10-01: qty 12.6

## 2022-10-01 MED ORDER — SODIUM CHLORIDE 0.9% FLUSH
10.0000 mL | Freq: Once | INTRAVENOUS | Status: AC
  Administered 2022-10-01: 10 mL via INTRAVENOUS

## 2022-10-01 MED ORDER — PALONOSETRON HCL INJECTION 0.25 MG/5ML
0.2500 mg | Freq: Once | INTRAVENOUS | Status: AC
  Administered 2022-10-01: 0.25 mg via INTRAVENOUS
  Filled 2022-10-01: qty 5

## 2022-10-01 NOTE — Progress Notes (Signed)
Pt presents today for D5 Vidaza per provider's order. Vital signs stable and pt voiced no new complaints at this time.  D5 Vidaza given today per MD orders. Tolerated infusion without adverse affects. Vital signs stable. No complaints at this time. Discharged from clinic ambulatory in stable condition. Alert and oriented x 3. F/U with [facility] as scheduled.   

## 2022-10-01 NOTE — Patient Instructions (Signed)
[facility]  Discharge Instructions: Thank you for choosing [facility] to provide your oncology and hematology care.  If you have a lab appointment with the Cancer Center, please come in thru the Main Entrance and check in at the main information desk.  Wear comfortable clothing and clothing appropriate for easy access to any Portacath or PICC line.   We strive to give you quality time with your provider. You may need to reschedule your appointment if you arrive late (15 or more minutes).  Arriving late affects you and other patients whose appointments are after yours.  Also, if you miss three or more appointments without notifying the office, you may be dismissed from the clinic at the provider's discretion.      For prescription refill requests, have your pharmacy contact our office and allow 72 hours for refills to be completed.    Today you received the following chemotherapy and/or immunotherapy agents D5 Vidaza   To help prevent nausea and vomiting after your treatment, we encourage you to take your nausea medication as directed.  Azacitidine Injection What is this medication? AZACITIDINE (ay za SITE i deen) treats blood and bone marrow cancers. It works by slowing down the growth of cancer cells. This medicine may be used for other purposes; ask your health care provider or pharmacist if you have questions. COMMON BRAND NAME(S): Vidaza What should I tell my care team before I take this medication? They need to know if you have any of these conditions: Kidney disease Liver disease Low blood cell levels, such as low white cells, platelets, or red blood cells Low levels of albumin in the blood Low levels of bicarbonate in the blood An unusual or allergic reaction to azacitidine, mannitol, other medications, foods, dyes, or preservatives If you or your partner are pregnant or trying to get pregnant Breast-feeding How should I use this  medication? This medication is injected into a vein or under the skin. It is given by your care team in a hospital or clinic setting. Talk to your care team about the use of this medication in children. While it may be prescribed for children as young as 1 month for selected conditions, precautions do apply. Overdosage: If you think you have taken too much of this medicine contact a poison control center or emergency room at once. NOTE: This medicine is only for you. Do not share this medicine with others. What if I miss a dose? Keep appointments for follow-up doses. It is important not to miss your dose. Call your care team if you are unable to keep an appointment. What may interact with this medication? Interactions are not expected. This list may not describe all possible interactions. Give your health care provider a list of all the medicines, herbs, non-prescription drugs, or dietary supplements you use. Also tell them if you smoke, drink alcohol, or use illegal drugs. Some items may interact with your medicine. What should I watch for while using this medication? Your condition will be monitored carefully while you are receiving this medication. You may need blood work while taking this medication. This medication may make you feel generally unwell. This is not uncommon as chemotherapy can affect healthy cells as well as cancer cells. Report any side effects. Continue your course of treatment even though you feel ill unless your care team tells you to stop. Other product types may be available that contain the medication azacitidine. The injection and oral products should not   be used in place of one another. Talk to your care team if you have questions. This medication can cause serious side effects. To reduce the risk, your care team may give you other medications to take before receiving this one. Be sure to follow the directions from your care team. This medication may increase your risk of  getting an infection. Call your care team for advice if you get a fever, chills, sore throat, or other symptoms of a cold or flu. Do not treat yourself. Try to avoid being around people who are sick. Avoid taking medications that contain aspirin, acetaminophen, ibuprofen, naproxen, or ketoprofen unless instructed by your care team. These medications may hide a fever. This medication may increase your risk to bruise or bleed. Call your care team if you notice any unusual bleeding. Be careful brushing or flossing your teeth or using a toothpick because you may get an infection or bleed more easily. If you have any dental work done, tell your dentist you are receiving this medication. Talk to your care team if you or your partner may be pregnant. Serious birth defects can occur if you take this medication during pregnancy and for 6 months after the last dose. You will need a negative pregnancy test before starting this medication. Contraception is recommended while taking his medication and for 6 months after the last dose. Your care team can help you find the option that works for you. If your partner can get pregnant, use a condom during sex while taking this medication and for 3 months after the last dose. Do not breastfeed while taking this medication and for 1 week after the last dose. This medication may cause infertility. Talk to your care team if you are concerned about your fertility. What side effects may I notice from receiving this medication? Side effects that you should report to your care team as soon as possible: Allergic reactions--skin rash, itching, hives, swelling of the face, lips, tongue, or throat Infection--fever, chills, cough, sore throat, wounds that don't heal, pain or trouble when passing urine, general feeling of discomfort or being unwell Kidney injury--decrease in the amount of urine, swelling of the ankles, hands, or feet Liver injury--right upper belly pain, loss of  appetite, nausea, light-colored stool, dark yellow or brown urine, yellowing skin or eyes, unusual weakness or fatigue Low red blood cell level--unusual weakness or fatigue, dizziness, headache, trouble breathing Tumor lysis syndrome (TLS)--nausea, vomiting, diarrhea, decrease in the amount of urine, dark urine, unusual weakness or fatigue, confusion, muscle pain or cramps, fast or irregular heartbeat, joint pain Unusual bruising or bleeding Side effects that usually do not require medical attention (report to your care team if they continue or are bothersome): Constipation Diarrhea Nausea Pain, redness, or irritation at injection site Vomiting This list may not describe all possible side effects. Call your doctor for medical advice about side effects. You may report side effects to FDA at 1-800-FDA-1088. Where should I keep my medication? This medication is given in a hospital or clinic. It will not be stored at home. NOTE: This sheet is a summary. It may not cover all possible information. If you have questions about this medicine, talk to your doctor, pharmacist, or health care provider.  2023 Elsevier/Gold Standard (2021-11-19 00:00:00)   BELOW ARE SYMPTOMS THAT SHOULD BE REPORTED IMMEDIATELY: *FEVER GREATER THAN 100.4 F (38 C) OR HIGHER *CHILLS OR SWEATING *NAUSEA AND VOMITING THAT IS NOT CONTROLLED WITH YOUR NAUSEA MEDICATION *UNUSUAL SHORTNESS OF BREATH *UNUSUAL BRUISING   OR BLEEDING *URINARY PROBLEMS (pain or burning when urinating, or frequent urination) *BOWEL PROBLEMS (unusual diarrhea, constipation, pain near the anus) TENDERNESS IN MOUTH AND THROAT WITH OR WITHOUT PRESENCE OF ULCERS (sore throat, sores in mouth, or a toothache) UNUSUAL RASH, SWELLING OR PAIN  UNUSUAL VAGINAL DISCHARGE OR ITCHING   Items with * indicate a potential emergency and should be followed up as soon as possible or go to the Emergency Department if any problems should occur.  Please show the  CHEMOTHERAPY ALERT CARD or IMMUNOTHERAPY ALERT CARD at check-in to the Emergency Department and triage nurse.  Should you have questions after your visit or need to cancel or reschedule your appointment, please contact [facility] [phone]  and follow the prompts.  Office hours are 8:00 a.m. to 4:30 p.m. Monday - Friday. Please note that voicemails left after 4:00 p.m. may not be returned until the following business day.  We are closed weekends and major holidays. You have access to a nurse at all times for urgent questions. Please call the main number to the clinic [phone] and follow the prompts.  For any non-urgent questions, you may also contact your provider using MyChart. We now offer e-Visits for anyone 18 and older to request care online for non-urgent symptoms. For details visit [email or web address].   Also download the MyChart app! Go to the app store, search "MyChart", open the app, select [facility], and log in with your MyChart username and password.   

## 2022-10-04 ENCOUNTER — Inpatient Hospital Stay: Payer: Medicare HMO

## 2022-10-04 VITALS — BP 92/70 | HR 79 | Temp 97.6°F | Resp 18 | Wt 143.8 lb

## 2022-10-04 DIAGNOSIS — C9310 Chronic myelomonocytic leukemia not having achieved remission: Secondary | ICD-10-CM

## 2022-10-04 DIAGNOSIS — Z5111 Encounter for antineoplastic chemotherapy: Secondary | ICD-10-CM | POA: Diagnosis not present

## 2022-10-04 DIAGNOSIS — R161 Splenomegaly, not elsewhere classified: Secondary | ICD-10-CM | POA: Diagnosis not present

## 2022-10-04 DIAGNOSIS — D539 Nutritional anemia, unspecified: Secondary | ICD-10-CM | POA: Diagnosis not present

## 2022-10-04 DIAGNOSIS — R110 Nausea: Secondary | ICD-10-CM | POA: Diagnosis not present

## 2022-10-04 DIAGNOSIS — D696 Thrombocytopenia, unspecified: Secondary | ICD-10-CM | POA: Diagnosis not present

## 2022-10-04 DIAGNOSIS — R634 Abnormal weight loss: Secondary | ICD-10-CM | POA: Diagnosis not present

## 2022-10-04 DIAGNOSIS — K5900 Constipation, unspecified: Secondary | ICD-10-CM | POA: Diagnosis not present

## 2022-10-04 DIAGNOSIS — E538 Deficiency of other specified B group vitamins: Secondary | ICD-10-CM | POA: Diagnosis not present

## 2022-10-04 DIAGNOSIS — Z95828 Presence of other vascular implants and grafts: Secondary | ICD-10-CM

## 2022-10-04 MED ORDER — SODIUM CHLORIDE 0.9 % IV SOLN
75.0000 mg/m2 | Freq: Once | INTRAVENOUS | Status: AC
  Administered 2022-10-04: 126 mg via INTRAVENOUS
  Filled 2022-10-04: qty 12.6

## 2022-10-04 MED ORDER — HEPARIN SOD (PORK) LOCK FLUSH 100 UNIT/ML IV SOLN: 500.0000 [IU] | Freq: Once | INTRAVENOUS | Status: DC

## 2022-10-04 MED ORDER — PALONOSETRON HCL INJECTION 0.25 MG/5ML
0.2500 mg | Freq: Once | INTRAVENOUS | Status: AC
  Administered 2022-10-04: 0.25 mg via INTRAVENOUS
  Filled 2022-10-04: qty 5

## 2022-10-04 MED ORDER — SODIUM CHLORIDE 0.9 % IV SOLN: Freq: Once | INTRAVENOUS | Status: AC

## 2022-10-04 MED ORDER — SODIUM CHLORIDE 0.9% FLUSH: 10.0000 mL | Freq: Once | INTRAVENOUS | Status: DC

## 2022-10-04 NOTE — Patient Instructions (Signed)
[facility]  Discharge Instructions: Thank you for choosing [facility] to provide your oncology and hematology care.  If you have a lab appointment with the Cancer Center, please come in thru the Main Entrance and check in at the main information desk.  Wear comfortable clothing and clothing appropriate for easy access to any Portacath or PICC line.   We strive to give you quality time with your provider. You may need to reschedule your appointment if you arrive late (15 or more minutes).  Arriving late affects you and other patients whose appointments are after yours.  Also, if you miss three or more appointments without notifying the office, you may be dismissed from the clinic at the provider's discretion.      For prescription refill requests, have your pharmacy contact our office and allow 72 hours for refills to be completed.    Today you received the following chemotherapy and/or immunotherapy agents D6 Vidaza   To help prevent nausea and vomiting after your treatment, we encourage you to take your nausea medication as directed.  Azacitidine Injection What is this medication? AZACITIDINE (ay za SITE i deen) treats blood and bone marrow cancers. It works by slowing down the growth of cancer cells. This medicine may be used for other purposes; ask your health care provider or pharmacist if you have questions. COMMON BRAND NAME(S): Vidaza What should I tell my care team before I take this medication? They need to know if you have any of these conditions: Kidney disease Liver disease Low blood cell levels, such as low white cells, platelets, or red blood cells Low levels of albumin in the blood Low levels of bicarbonate in the blood An unusual or allergic reaction to azacitidine, mannitol, other medications, foods, dyes, or preservatives If you or your partner are pregnant or trying to get pregnant Breast-feeding How should I use this  medication? This medication is injected into a vein or under the skin. It is given by your care team in a hospital or clinic setting. Talk to your care team about the use of this medication in children. While it may be prescribed for children as young as 1 month for selected conditions, precautions do apply. Overdosage: If you think you have taken too much of this medicine contact a poison control center or emergency room at once. NOTE: This medicine is only for you. Do not share this medicine with others. What if I miss a dose? Keep appointments for follow-up doses. It is important not to miss your dose. Call your care team if you are unable to keep an appointment. What may interact with this medication? Interactions are not expected. This list may not describe all possible interactions. Give your health care provider a list of all the medicines, herbs, non-prescription drugs, or dietary supplements you use. Also tell them if you smoke, drink alcohol, or use illegal drugs. Some items may interact with your medicine. What should I watch for while using this medication? Your condition will be monitored carefully while you are receiving this medication. You may need blood work while taking this medication. This medication may make you feel generally unwell. This is not uncommon as chemotherapy can affect healthy cells as well as cancer cells. Report any side effects. Continue your course of treatment even though you feel ill unless your care team tells you to stop. Other product types may be available that contain the medication azacitidine. The injection and oral products should not   be used in place of one another. Talk to your care team if you have questions. This medication can cause serious side effects. To reduce the risk, your care team may give you other medications to take before receiving this one. Be sure to follow the directions from your care team. This medication may increase your risk of  getting an infection. Call your care team for advice if you get a fever, chills, sore throat, or other symptoms of a cold or flu. Do not treat yourself. Try to avoid being around people who are sick. Avoid taking medications that contain aspirin, acetaminophen, ibuprofen, naproxen, or ketoprofen unless instructed by your care team. These medications may hide a fever. This medication may increase your risk to bruise or bleed. Call your care team if you notice any unusual bleeding. Be careful brushing or flossing your teeth or using a toothpick because you may get an infection or bleed more easily. If you have any dental work done, tell your dentist you are receiving this medication. Talk to your care team if you or your partner may be pregnant. Serious birth defects can occur if you take this medication during pregnancy and for 6 months after the last dose. You will need a negative pregnancy test before starting this medication. Contraception is recommended while taking his medication and for 6 months after the last dose. Your care team can help you find the option that works for you. If your partner can get pregnant, use a condom during sex while taking this medication and for 3 months after the last dose. Do not breastfeed while taking this medication and for 1 week after the last dose. This medication may cause infertility. Talk to your care team if you are concerned about your fertility. What side effects may I notice from receiving this medication? Side effects that you should report to your care team as soon as possible: Allergic reactions--skin rash, itching, hives, swelling of the face, lips, tongue, or throat Infection--fever, chills, cough, sore throat, wounds that don't heal, pain or trouble when passing urine, general feeling of discomfort or being unwell Kidney injury--decrease in the amount of urine, swelling of the ankles, hands, or feet Liver injury--right upper belly pain, loss of  appetite, nausea, light-colored stool, dark yellow or brown urine, yellowing skin or eyes, unusual weakness or fatigue Low red blood cell level--unusual weakness or fatigue, dizziness, headache, trouble breathing Tumor lysis syndrome (TLS)--nausea, vomiting, diarrhea, decrease in the amount of urine, dark urine, unusual weakness or fatigue, confusion, muscle pain or cramps, fast or irregular heartbeat, joint pain Unusual bruising or bleeding Side effects that usually do not require medical attention (report to your care team if they continue or are bothersome): Constipation Diarrhea Nausea Pain, redness, or irritation at injection site Vomiting This list may not describe all possible side effects. Call your doctor for medical advice about side effects. You may report side effects to FDA at 1-800-FDA-1088. Where should I keep my medication? This medication is given in a hospital or clinic. It will not be stored at home. NOTE: This sheet is a summary. It may not cover all possible information. If you have questions about this medicine, talk to your doctor, pharmacist, or health care provider.  2023 Elsevier/Gold Standard (2021-11-19 00:00:00)   BELOW ARE SYMPTOMS THAT SHOULD BE REPORTED IMMEDIATELY: *FEVER GREATER THAN 100.4 F (38 C) OR HIGHER *CHILLS OR SWEATING *NAUSEA AND VOMITING THAT IS NOT CONTROLLED WITH YOUR NAUSEA MEDICATION *UNUSUAL SHORTNESS OF BREATH *UNUSUAL BRUISING   OR BLEEDING *URINARY PROBLEMS (pain or burning when urinating, or frequent urination) *BOWEL PROBLEMS (unusual diarrhea, constipation, pain near the anus) TENDERNESS IN MOUTH AND THROAT WITH OR WITHOUT PRESENCE OF ULCERS (sore throat, sores in mouth, or a toothache) UNUSUAL RASH, SWELLING OR PAIN  UNUSUAL VAGINAL DISCHARGE OR ITCHING   Items with * indicate a potential emergency and should be followed up as soon as possible or go to the Emergency Department if any problems should occur.  Please show the  CHEMOTHERAPY ALERT CARD or IMMUNOTHERAPY ALERT CARD at check-in to the Emergency Department and triage nurse.  Should you have questions after your visit or need to cancel or reschedule your appointment, please contact [facility] [phone]  and follow the prompts.  Office hours are 8:00 a.m. to 4:30 p.m. Monday - Friday. Please note that voicemails left after 4:00 p.m. may not be returned until the following business day.  We are closed weekends and major holidays. You have access to a nurse at all times for urgent questions. Please call the main number to the clinic [phone] and follow the prompts.  For any non-urgent questions, you may also contact your provider using MyChart. We now offer e-Visits for anyone 18 and older to request care online for non-urgent symptoms. For details visit [email or web address].   Also download the MyChart app! Go to the app store, search "MyChart", open the app, select [facility], and log in with your MyChart username and password.   

## 2022-10-04 NOTE — Progress Notes (Signed)
Pt presents today for D6 Vidaza per provider's order. Vital signs stable and pt voiced no new complaints at this time.  D6 Vidaza given today per MD orders. Tolerated infusion without adverse affects. Vital signs stable. No complaints at this time. Discharged from clinic ambulatory in stable condition. Alert and oriented x 3. F/U with [facility] as scheduled.   

## 2022-10-05 ENCOUNTER — Inpatient Hospital Stay: Payer: Medicare HMO

## 2022-10-05 ENCOUNTER — Other Ambulatory Visit: Payer: Self-pay | Admitting: Hematology

## 2022-10-05 VITALS — BP 100/75 | HR 76 | Temp 97.8°F | Resp 18

## 2022-10-05 DIAGNOSIS — K5900 Constipation, unspecified: Secondary | ICD-10-CM | POA: Diagnosis not present

## 2022-10-05 DIAGNOSIS — R161 Splenomegaly, not elsewhere classified: Secondary | ICD-10-CM | POA: Diagnosis not present

## 2022-10-05 DIAGNOSIS — G4700 Insomnia, unspecified: Secondary | ICD-10-CM

## 2022-10-05 DIAGNOSIS — D696 Thrombocytopenia, unspecified: Secondary | ICD-10-CM | POA: Diagnosis not present

## 2022-10-05 DIAGNOSIS — Z95828 Presence of other vascular implants and grafts: Secondary | ICD-10-CM

## 2022-10-05 DIAGNOSIS — R634 Abnormal weight loss: Secondary | ICD-10-CM | POA: Diagnosis not present

## 2022-10-05 DIAGNOSIS — Z5111 Encounter for antineoplastic chemotherapy: Secondary | ICD-10-CM | POA: Diagnosis not present

## 2022-10-05 DIAGNOSIS — E538 Deficiency of other specified B group vitamins: Secondary | ICD-10-CM | POA: Diagnosis not present

## 2022-10-05 DIAGNOSIS — C9310 Chronic myelomonocytic leukemia not having achieved remission: Secondary | ICD-10-CM | POA: Diagnosis not present

## 2022-10-05 DIAGNOSIS — D539 Nutritional anemia, unspecified: Secondary | ICD-10-CM | POA: Diagnosis not present

## 2022-10-05 DIAGNOSIS — R110 Nausea: Secondary | ICD-10-CM | POA: Diagnosis not present

## 2022-10-05 MED ORDER — SODIUM CHLORIDE 0.9 % IV SOLN: Freq: Once | INTRAVENOUS | Status: AC

## 2022-10-05 MED ORDER — SODIUM CHLORIDE 0.9 % IV SOLN
75.0000 mg/m2 | Freq: Once | INTRAVENOUS | Status: AC
  Administered 2022-10-05: 126 mg via INTRAVENOUS
  Filled 2022-10-05: qty 12.6

## 2022-10-05 MED ORDER — SODIUM CHLORIDE 0.9% FLUSH
10.0000 mL | Freq: Once | INTRAVENOUS | Status: AC
  Administered 2022-10-05: 10 mL via INTRAVENOUS

## 2022-10-05 MED ORDER — HEPARIN SOD (PORK) LOCK FLUSH 100 UNIT/ML IV SOLN
500.0000 [IU] | Freq: Once | INTRAVENOUS | Status: AC
  Administered 2022-10-05: 500 [IU] via INTRAVENOUS

## 2022-10-05 NOTE — Progress Notes (Signed)
Patient presents today for chemotherapy, Vidaza, today. Patient in satisfactory condition with complaint of new, dry cough. Patient states "I think it's my allergies. Denies any fevers. Dr Delton Coombes aware, recommend over there counter Robitussin. Vital signs are stable. We will proceed with treatment per MD order.   Patient presents today for treatment per orders.  Patient tolerated treatment well with no complaints voiced.  Patient left ambulatory in stable condition.  Vital signs stable at discharge.  Follow up as scheduled.

## 2022-10-05 NOTE — Patient Instructions (Signed)
[facility] [place]  Discharge Instructions: Thank you for choosing [facility] to provide your oncology and hematology care.  If you have a lab appointment with the Cancer Center, please come in thru the Main Entrance and check in at the main information desk.  Wear comfortable clothing and clothing appropriate for easy access to any Portacath or PICC line.   We strive to give you quality time with your provider. You may need to reschedule your appointment if you arrive late (15 or more minutes).  Arriving late affects you and other patients whose appointments are after yours.  Also, if you miss three or more appointments without notifying the office, you may be dismissed from the clinic at the provider's discretion.      For prescription refill requests, have your pharmacy contact our office and allow 72 hours for refills to be completed.    Today you received the following chemotherapy and/or immunotherapy agents Vidaza. Azacitidine Injection What is this medication? AZACITIDINE (ay za SITE i deen) treats blood and bone marrow cancers. It works by slowing down the growth of cancer cells. This medicine may be used for other purposes; ask your health care provider or pharmacist if you have questions. COMMON BRAND NAME(S): Vidaza What should I tell my care team before I take this medication? They need to know if you have any of these conditions: Kidney disease Liver disease Low blood cell levels, such as low white cells, platelets, or red blood cells Low levels of albumin in the blood Low levels of bicarbonate in the blood An unusual or allergic reaction to azacitidine, mannitol, other medications, foods, dyes, or preservatives If you or your partner are pregnant or trying to get pregnant Breast-feeding How should I use this medication? This medication is injected into a vein or under the skin. It is given by your care team in a hospital or clinic setting. Talk  to your care team about the use of this medication in children. While it may be prescribed for children as young as 1 month for selected conditions, precautions do apply. Overdosage: If you think you have taken too much of this medicine contact a poison control center or emergency room at once. NOTE: This medicine is only for you. Do not share this medicine with others. What if I miss a dose? Keep appointments for follow-up doses. It is important not to miss your dose. Call your care team if you are unable to keep an appointment. What may interact with this medication? Interactions are not expected. This list may not describe all possible interactions. Give your health care provider a list of all the medicines, herbs, non-prescription drugs, or dietary supplements you use. Also tell them if you smoke, drink alcohol, or use illegal drugs. Some items may interact with your medicine. What should I watch for while using this medication? Your condition will be monitored carefully while you are receiving this medication. You may need blood work while taking this medication. This medication may make you feel generally unwell. This is not uncommon as chemotherapy can affect healthy cells as well as cancer cells. Report any side effects. Continue your course of treatment even though you feel ill unless your care team tells you to stop. Other product types may be available that contain the medication azacitidine. The injection and oral products should not be used in place of one another. Talk to your care team if you have questions. This medication can cause serious side effects.   To reduce the risk, your care team may give you other medications to take before receiving this one. Be sure to follow the directions from your care team. This medication may increase your risk of getting an infection. Call your care team for advice if you get a fever, chills, sore throat, or other symptoms of a cold or flu. Do not treat  yourself. Try to avoid being around people who are sick. Avoid taking medications that contain aspirin, acetaminophen, ibuprofen, naproxen, or ketoprofen unless instructed by your care team. These medications may hide a fever. This medication may increase your risk to bruise or bleed. Call your care team if you notice any unusual bleeding. Be careful brushing or flossing your teeth or using a toothpick because you may get an infection or bleed more easily. If you have any dental work done, tell your dentist you are receiving this medication. Talk to your care team if you or your partner may be pregnant. Serious birth defects can occur if you take this medication during pregnancy and for 6 months after the last dose. You will need a negative pregnancy test before starting this medication. Contraception is recommended while taking his medication and for 6 months after the last dose. Your care team can help you find the option that works for you. If your partner can get pregnant, use a condom during sex while taking this medication and for 3 months after the last dose. Do not breastfeed while taking this medication and for 1 week after the last dose. This medication may cause infertility. Talk to your care team if you are concerned about your fertility. What side effects may I notice from receiving this medication? Side effects that you should report to your care team as soon as possible: Allergic reactions--skin rash, itching, hives, swelling of the face, lips, tongue, or throat Infection--fever, chills, cough, sore throat, wounds that don't heal, pain or trouble when passing urine, general feeling of discomfort or being unwell Kidney injury--decrease in the amount of urine, swelling of the ankles, hands, or feet Liver injury--right upper belly pain, loss of appetite, nausea, light-colored stool, dark yellow or brown urine, yellowing skin or eyes, unusual weakness or fatigue Low red blood cell  level--unusual weakness or fatigue, dizziness, headache, trouble breathing Tumor lysis syndrome (TLS)--nausea, vomiting, diarrhea, decrease in the amount of urine, dark urine, unusual weakness or fatigue, confusion, muscle pain or cramps, fast or irregular heartbeat, joint pain Unusual bruising or bleeding Side effects that usually do not require medical attention (report to your care team if they continue or are bothersome): Constipation Diarrhea Nausea Pain, redness, or irritation at injection site Vomiting This list may not describe all possible side effects. Call your doctor for medical advice about side effects. You may report side effects to FDA at 1-800-FDA-1088. Where should I keep my medication? This medication is given in a hospital or clinic. It will not be stored at home. NOTE: This sheet is a summary. It may not cover all possible information. If you have questions about this medicine, talk to your doctor, pharmacist, or health care provider.  2023 [employer]vier/Gold Standard (2021-11-19 00:00:00)       To help prevent nausea and vomiting after your treatment, we encourage you to take your nausea medication as directed.  BELOW ARE SYMPTOMS THAT SHOULD BE REPORTED IMMEDIATELY: *FEVER GREATER THAN 100.4 F (38 C) OR HIGHER *CHILLS OR SWEATING *NAUSEA AND VOMITING THAT IS NOT CONTROLLED WITH YOUR NAUSEA MEDICATION *UNUSUAL SHORTNESS OF BREATH *UNUSUAL   BRUISING OR BLEEDING *URINARY PROBLEMS (pain or burning when urinating, or frequent urination) *BOWEL PROBLEMS (unusual diarrhea, constipation, pain near the anus) TENDERNESS IN MOUTH AND THROAT WITH OR WITHOUT PRESENCE OF ULCERS (sore throat, sores in mouth, or a toothache) UNUSUAL RASH, SWELLING OR PAIN  UNUSUAL VAGINAL DISCHARGE OR ITCHING   Items with * indicate a potential emergency and should be followed up as soon as possible or go to the Emergency Department if any problems should occur.  Please show the CHEMOTHERAPY ALERT  CARD or IMMUNOTHERAPY ALERT CARD at check-in to the Emergency Department and triage nurse.  Should you have questions after your visit or need to cancel or reschedule your appointment, please contact [facility] [place] [phone]  and follow the prompts.  Office hours are 8:00 a.m. to 4:30 p.m. Monday - Friday. Please note that voicemails left after 4:00 p.m. may not be returned until the following business day.  We are closed weekends and major holidays. You have access to a nurse at all times for urgent questions. Please call the main number to the clinic [phone] and follow the prompts.  For any non-urgent questions, you may also contact your provider using MyChart. We now offer e-Visits for anyone 18 and older to request care online for non-urgent symptoms. For details visit [email or web address].   Also download the MyChart app! Go to the app store, search "MyChart", open the app, select [facility], and log in with your MyChart username and password.   

## 2022-10-15 ENCOUNTER — Other Ambulatory Visit: Payer: Self-pay | Admitting: Allergy

## 2022-10-18 ENCOUNTER — Ambulatory Visit (INDEPENDENT_AMBULATORY_CARE_PROVIDER_SITE_OTHER): Payer: Medicare HMO

## 2022-10-18 DIAGNOSIS — J4550 Severe persistent asthma, uncomplicated: Secondary | ICD-10-CM | POA: Diagnosis not present

## 2022-10-19 ENCOUNTER — Other Ambulatory Visit: Payer: Self-pay

## 2022-10-19 DIAGNOSIS — C9310 Chronic myelomonocytic leukemia not having achieved remission: Secondary | ICD-10-CM

## 2022-10-20 ENCOUNTER — Other Ambulatory Visit: Payer: Self-pay | Admitting: Allergy

## 2022-10-22 ENCOUNTER — Other Ambulatory Visit: Payer: Self-pay | Admitting: *Deleted

## 2022-10-22 MED ORDER — IPRATROPIUM BROMIDE 0.06 % NA SOLN: NASAL | 5 refills | Status: DC

## 2022-10-22 MED ORDER — BREZTRI AEROSPHERE 160-9-4.8 MCG/ACT IN AERO: 2.0000 | INHALATION_SPRAY | Freq: Two times a day (BID) | RESPIRATORY_TRACT | 5 refills | Status: DC

## 2022-10-25 ENCOUNTER — Other Ambulatory Visit: Payer: Self-pay | Admitting: *Deleted

## 2022-10-25 ENCOUNTER — Inpatient Hospital Stay: Payer: Medicare HMO | Attending: Hematology

## 2022-10-25 ENCOUNTER — Inpatient Hospital Stay: Payer: Medicare HMO

## 2022-10-25 ENCOUNTER — Inpatient Hospital Stay: Payer: Medicare HMO | Admitting: Hematology

## 2022-10-25 VITALS — BP 106/68 | HR 72 | Temp 97.5°F | Resp 18

## 2022-10-25 DIAGNOSIS — Z79899 Other long term (current) drug therapy: Secondary | ICD-10-CM | POA: Insufficient documentation

## 2022-10-25 DIAGNOSIS — C9310 Chronic myelomonocytic leukemia not having achieved remission: Secondary | ICD-10-CM | POA: Diagnosis not present

## 2022-10-25 DIAGNOSIS — Z807 Family history of other malignant neoplasms of lymphoid, hematopoietic and related tissues: Secondary | ICD-10-CM | POA: Diagnosis not present

## 2022-10-25 DIAGNOSIS — E538 Deficiency of other specified B group vitamins: Secondary | ICD-10-CM | POA: Diagnosis not present

## 2022-10-25 DIAGNOSIS — Z8379 Family history of other diseases of the digestive system: Secondary | ICD-10-CM | POA: Diagnosis not present

## 2022-10-25 DIAGNOSIS — Z818 Family history of other mental and behavioral disorders: Secondary | ICD-10-CM | POA: Insufficient documentation

## 2022-10-25 DIAGNOSIS — I509 Heart failure, unspecified: Secondary | ICD-10-CM | POA: Insufficient documentation

## 2022-10-25 DIAGNOSIS — Z882 Allergy status to sulfonamides status: Secondary | ICD-10-CM | POA: Diagnosis not present

## 2022-10-25 DIAGNOSIS — Z825 Family history of asthma and other chronic lower respiratory diseases: Secondary | ICD-10-CM | POA: Diagnosis not present

## 2022-10-25 DIAGNOSIS — Z5111 Encounter for antineoplastic chemotherapy: Secondary | ICD-10-CM | POA: Insufficient documentation

## 2022-10-25 DIAGNOSIS — Z95828 Presence of other vascular implants and grafts: Secondary | ICD-10-CM

## 2022-10-25 DIAGNOSIS — Z8249 Family history of ischemic heart disease and other diseases of the circulatory system: Secondary | ICD-10-CM | POA: Insufficient documentation

## 2022-10-25 DIAGNOSIS — D539 Nutritional anemia, unspecified: Secondary | ICD-10-CM | POA: Insufficient documentation

## 2022-10-25 DIAGNOSIS — R634 Abnormal weight loss: Secondary | ICD-10-CM | POA: Insufficient documentation

## 2022-10-25 DIAGNOSIS — Z833 Family history of diabetes mellitus: Secondary | ICD-10-CM | POA: Insufficient documentation

## 2022-10-25 DIAGNOSIS — K5900 Constipation, unspecified: Secondary | ICD-10-CM | POA: Diagnosis not present

## 2022-10-25 DIAGNOSIS — Z83518 Family history of other specified eye disorder: Secondary | ICD-10-CM | POA: Diagnosis not present

## 2022-10-25 DIAGNOSIS — Z800 Family history of malignant neoplasm of digestive organs: Secondary | ICD-10-CM | POA: Insufficient documentation

## 2022-10-25 DIAGNOSIS — D696 Thrombocytopenia, unspecified: Secondary | ICD-10-CM | POA: Insufficient documentation

## 2022-10-25 LAB — CBC WITH DIFFERENTIAL/PLATELET
Abs Immature Granulocytes: 0.08 10*3/uL — ABNORMAL HIGH (ref 0.00–0.07)
Basophils Absolute: 0 10*3/uL (ref 0.0–0.1)
Basophils Relative: 1 %
Eosinophils Absolute: 0 10*3/uL (ref 0.0–0.5)
Eosinophils Relative: 1 %
HCT: 30.7 % — ABNORMAL LOW (ref 36.0–46.0)
Hemoglobin: 9.5 g/dL — ABNORMAL LOW (ref 12.0–15.0)
Immature Granulocytes: 3 %
Lymphocytes Relative: 25 %
Lymphs Abs: 0.6 10*3/uL — ABNORMAL LOW (ref 0.7–4.0)
MCH: 30.7 pg (ref 26.0–34.0)
MCHC: 30.9 g/dL (ref 30.0–36.0)
MCV: 99.4 fL (ref 80.0–100.0)
Monocytes Absolute: 0.3 10*3/uL (ref 0.1–1.0)
Monocytes Relative: 11 %
Neutro Abs: 1.5 10*3/uL — ABNORMAL LOW (ref 1.7–7.7)
Neutrophils Relative %: 59 %
Platelets: 227 10*3/uL (ref 150–400)
RBC: 3.09 MIL/uL — ABNORMAL LOW (ref 3.87–5.11)
RDW: 18.1 % — ABNORMAL HIGH (ref 11.5–15.5)
WBC: 2.5 10*3/uL — ABNORMAL LOW (ref 4.0–10.5)
nRBC: 0 % (ref 0.0–0.2)

## 2022-10-25 LAB — COMPREHENSIVE METABOLIC PANEL WITH GFR
ALT: 11 U/L (ref 0–44)
AST: 25 U/L (ref 15–41)
Albumin: 4.1 g/dL (ref 3.5–5.0)
Alkaline Phosphatase: 58 U/L (ref 38–126)
Anion gap: 7 (ref 5–15)
BUN: 8 mg/dL (ref 8–23)
CO2: 25 mmol/L (ref 22–32)
Calcium: 9.1 mg/dL (ref 8.9–10.3)
Chloride: 105 mmol/L (ref 98–111)
Creatinine, Ser: 0.87 mg/dL (ref 0.44–1.00)
GFR, Estimated: >60 mL/min
Glucose, Bld: 99 mg/dL (ref 70–99)
Potassium: 3.8 mmol/L (ref 3.5–5.1)
Sodium: 137 mmol/L (ref 135–145)
Total Bilirubin: 1 mg/dL (ref 0.3–1.2)
Total Protein: 6.9 g/dL (ref 6.5–8.1)

## 2022-10-25 LAB — MAGNESIUM: Magnesium: 2 mg/dL (ref 1.7–2.4)

## 2022-10-25 LAB — LACTATE DEHYDROGENASE: LDH: 531 U/L — ABNORMAL HIGH (ref 98–192)

## 2022-10-25 MED ORDER — PALONOSETRON HCL INJECTION 0.25 MG/5ML
0.2500 mg | Freq: Once | INTRAVENOUS | Status: AC
  Administered 2022-10-25: 0.25 mg via INTRAVENOUS
  Filled 2022-10-25: qty 5

## 2022-10-25 MED ORDER — SODIUM CHLORIDE 0.9 % IV SOLN: Freq: Once | INTRAVENOUS | Status: AC

## 2022-10-25 MED ORDER — SODIUM CHLORIDE 0.9% FLUSH
10.0000 mL | Freq: Once | INTRAVENOUS | Status: AC
  Administered 2022-10-25: 10 mL via INTRAVENOUS

## 2022-10-25 MED ORDER — HYDROCODONE BIT-HOMATROP MBR 5-1.5 MG/5ML PO SOLN: ORAL | 0 refills | Status: DC

## 2022-10-25 MED ORDER — SODIUM CHLORIDE 0.9 % IV SOLN
75.0000 mg/m2 | Freq: Once | INTRAVENOUS | Status: AC
  Administered 2022-10-25: 126 mg via INTRAVENOUS
  Filled 2022-10-25: qty 12.6

## 2022-10-25 MED ORDER — HEPARIN SOD (PORK) LOCK FLUSH 100 UNIT/ML IV SOLN
500.0000 [IU] | Freq: Once | INTRAVENOUS | Status: AC
  Administered 2022-10-25: 500 [IU] via INTRAVENOUS

## 2022-10-25 NOTE — Progress Notes (Signed)
[facility] [address] [place], [place] [id number]    Clinic Day:  10/25/2022  Referring physician: Carylon PerchesFagan, Roy, MD  Patient Care Team: Carylon PerchesFagan, Roy, MD as PCP - General (Internal Medicine) Jena Gaussourk, Gerrit Friendsobert M, MD as Attending Physician (Gastroenterology) Storm FriskWright, Patrick E, MD as Consulting Physician (Pulmonary Disease) Doreatha MassedKatragadda, Jakaylee Sasaki, MD as Medical Oncologist (Hematology)   ASSESSMENT & PLAN:   Assessment: 1.  Macrocytic anemia and thrombocytopenia: - Patient seen at the request of Dr. Ouida SillsFagan for abnormal CBC. - 04/17/2022: WBC 15.8, Hb-9.7, PLT-128 - 03/24/2022: WBC-8.1 (N-43%, L-17%, M-19%, B-2%), Hb-9.6, MCV-99, PLT-115 - Smear review: 13% metamyelocytes, 2% myelocytes, 2% blasts,  - 10/19/2021: WBC-5.3 (N-61, L-21, M-10%), Hb-11, MCV-96, PLT-97 - BMBX (05/25/2022): Hypercellular bone marrow with myeloid hyperplasia with dysgranulopoiesis, erythroid hypoplasia and megakaryocytic hyperplasia with dyspoiesis.  Chromosome analysis was 3746, XX.  Bone marrow blasts less than 5%.  Peripheral blood blasts less than 2%. - Mayo molecular model risk stratification: Intermediate 2 risk with at least 2 points (decreased hemoglobin less than 10, circulating immature cells).  Intermediate 2 risk with median OS 31 months. - Serum EPO level 41. - NGS: Positive for CBL, MPL, SRSF2, TET 2, RAD21 - PET scan (07/08/2022): Splenomegaly (volume 510 mm) with normal metabolic activity.  Uniform increase in marrow metabolic activity related to patient's anemia.  No lymphadenopathy. - BMBX (07/16/2022): Hypercellular bone marrow (95-100%) with myeloid hyperplasia, atypical monocytes, increased blasts (16% overall).  Atypical monocytosis present 23% of total events, expressing HLA-DR, CD38, CD4 dim, CD11C, CD13, CD14, CD36, CD64 with aberrant coexpression of CD56 and CD7. - Cycle 1 of azacitidine on 08/02/2022, cycle 2 on 08/30/2022. - BMBX (09/21/2022): 3% blasts, hypercellular marrow with markedly  increased dyspoietic megakaryocytes, increased fibrosis   2.  Social/family history: - She lives at home by herself.  Son lives next door. - Does part-time work at [employer].  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  Non-smoker. - Maternal aunt had tumor in the breast, patient not certain if it is cancer.  2 maternal first cousins had lymphoma.  Mother had stomach cancer.  Plan: 1.  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  She tolerated it very well.  No fevers or infections. - She is continuing to take Hycodan for cough which is helping. - She is continuing to work part-time on Tuesday and Thursday. - Labs today shows normal LFTs and creatinine.  CBC shows white count is 1.5 with normal platelet count and hemoglobin 9.5.  LDH is 531. - She will proceed with cycle 4 today. - She will follow-up with [facility] Lowell [facility]ell [facility] after this cycle.  RTC 4 weeks for follow-up.   2.  Folate deficiency: - Continue folic acid 1 mg tablet daily.   3.  Weight loss: - Appetite is continuing to be better despite off of Megace.   4.  Constipation: - Continue stool softener and lactulose.  This is better controlled.  Orders Placed This Encounter  Procedures   Magnesium    Standing Status:   Future    Standing Expiration Date:   11/22/2023   Lactate dehydrogenase    Standing Status:   Future    Standing Expiration Date:   11/22/2023   CMP (Cancer Center only)    Standing Status:   Future    Standing Expiration Date:   11/22/2023   CBC with Differential    Standing Status:  Future    Standing Expiration Date:   11/22/2023   CBC with Differential    Standing Status:   Future    Standing Expiration Date:   11/29/2023   Magnesium    Standing Status:   Future    Standing Expiration Date:   12/20/2023   Lactate dehydrogenase    Standing Status:   Future    Standing Expiration Date:   12/20/2023   CMP (Cancer Center  only)    Standing Status:   Future    Standing Expiration Date:   12/20/2023   CBC with Differential    Standing Status:   Future    Standing Expiration Date:   12/20/2023   CBC with Differential    Standing Status:   Future    Standing Expiration Date:   12/27/2023     I,Alexis Herring,acting as a scribe for Doreatha Massed, MD.,have documented all relevant documentation on the behalf of Doreatha Massed, MD,as directed by  Doreatha Massed, MD while in the presence of Doreatha Massed, MD.  I, Doreatha Massed MD, have reviewed the above documentation for accuracy and completeness, and I agree with the above.    Doreatha Massed, MD   4/8/20246:59 PM  CHIEF COMPLAINT:   Diagnosis: CMML-2    Cancer Staging  No matching staging information was found for the patient.   Prior Therapy: None  Current Therapy:  Azacitidine 70 mg/m x 7 days every 28 days   HISTORY OF PRESENT ILLNESS:   Oncology History  CMML (chronic myelomonocytic leukemia)  06/03/2022 Initial Diagnosis   CMML (chronic myelomonocytic leukemia) (HCC)   08/02/2022 -  Chemotherapy   Patient is on Treatment Plan : MYELODYSPLASIA  Azacitidine IVPB D1-7 q28d        INTERVAL HISTORY:   [name] is a 72 y.o. female presenting to clinic today for follow up of CMML-2. She was last seen by me on 09/27/22.  Today, she states that she is doing well overall. Her appetite level is at 80%. She is not requiring any medications to help with appetite at this time. Her energy level is at 100%. She has had more regular bowel movements with stool softeners and lactulose.  She denies any recent infections or fevers. She reports compliance with folic acid. She is still using Hycodan with relief of her cough.  She is still working half days Tuesdays and Thursdays.  PAST MEDICAL HISTORY:   Past Medical History: Past Medical History:  Diagnosis Date   Anxiety    Asthma    had 1 episode 1 year ago-no more  problem   CHF (congestive heart failure) (HCC)    swelling of feet & ankles   CMML (chronic myelomonocytic leukemia) (HCC) 06/03/2022   Cough    Depression    OCD   GERD (gastroesophageal reflux disease)    severe   OCD (obsessive compulsive disorder)    Port-A-Cath in place 07/26/2022   Shortness of breath    with exertion   Yeast infection 04/09/2014    Surgical History: Past Surgical History:  Procedure Laterality Date   CARPAL TUNNEL RELEASE  03/22/2012   Procedure: CARPAL TUNNEL RELEASE;  Surgeon: Nicki Reaper, MD;  Location: [facility];  Service: Orthopedics;  Laterality: Right;  right carpal tunnel release   COLONOSCOPY  06/19/2012   Dr. Rourk:normal rectum and colon    ESOPHAGOGASTRODUODENOSCOPY N/A 12/26/2015   Dr. Jena Gauss: normal    IR IMAGING GUIDED PORT INSERTION  07/30/2022   TONSILLECTOMY  TUBAL LIGATION      Social History: Social History   Socioeconomic History   Marital status: Widowed    Spouse name: Not on file   Number of children: 1   Years of education: Not on file   Highest education level: Not on file  Occupational History   Occupation: retired  Tobacco Use   Smoking status: Never    Passive exposure: Yes   Smokeless tobacco: Never  Vaping Use   Vaping Use: Never used  Substance and Sexual Activity   Alcohol use: No   Drug use: No   Sexual activity: Yes    Birth control/protection: Surgical    Comment: tubal  Other Topics Concern   Not on file  Social History Narrative   Not on file   Social Determinants of Health   Financial Resource Strain: Not on file  Food Insecurity: Not on file  Transportation Needs: Not on file  Physical Activity: Not on file  Stress: Not on file  Social Connections: Not on file  Intimate Partner Violence: Not on file    Family History: Family History  Problem Relation Age of Onset   Asthma Mother    Macular degeneration Mother    Hypertension Mother    Stomach cancer Mother 16    Alzheimer's disease Father    Other Sister        blood clots   Diabetes Maternal Grandfather    Diabetes Paternal Grandfather    Other Son        enlarged spleen   Lymphoma Cousin        x2, both dx in their 39s   Colon cancer Neg Hx     Current Medications:  Current Outpatient Medications:    albuterol (VENTOLIN HFA) 108 (90 Base) MCG/ACT inhaler, INHALE 2 PUFFS BY MOUTH EVERY 6 HOURS AS NEEDED FOR SHORTNESS OF BREATH OR WHEEZING., Disp: 18 g, Rfl: 2   allopurinol (ZYLOPRIM) 300 MG tablet, Take 1 tablet (300 mg total) by mouth daily., Disp: 30 tablet, Rfl: 0   azaCITIDine 5 mg/2 mLs in lactated ringers infusion, Inject into the vein daily. Days 1-7 every 28 days, Disp: , Rfl:    azelastine (ASTELIN) 0.1 % nasal spray, SPRAY 1 TO 2 SPRAYS PER NOSTRIL TWICE DAILY., Disp: 30 mL, Rfl: 3   Budeson-Glycopyrrol-Formoterol (BREZTRI AEROSPHERE) 160-9-4.8 MCG/ACT AERO, Inhale 2 puffs into the lungs 2 (two) times daily., Disp: 10.7 g, Rfl: 5   celecoxib (CELEBREX) 100 MG capsule, Take 2 capsules (200 mg total) by mouth 2 (two) times daily., Disp: 20 capsule, Rfl: 0   cetirizine (ZYRTEC) 5 MG tablet, Take 1 tablet (5 mg total) by mouth daily., Disp: 30 tablet, Rfl: 0   cimetidine (TAGAMET) 800 MG tablet, TAKE 1/2 TABLET BY MOUTH ONCE OR TWICE DAILY AS NEEDED, Disp: 30 tablet, Rfl: 2   dexlansoprazole (DEXILANT) 60 MG capsule, Take 1 capsule (60 mg total) by mouth daily., Disp: 30 capsule, Rfl: 5   doxycycline (VIBRAMYCIN) 100 MG capsule, Take 100 mg by mouth 2 (two) times daily., Disp: , Rfl:    EPINEPHrine 0.3 mg/0.3 mL IJ SOAJ injection, Inject 0.3 mg into the muscle as needed for anaphylaxis., Disp: 2 each, Rfl: 1   famotidine (PEPCID) 20 MG tablet, Take 20 mg by mouth 2 (two) times daily., Disp: , Rfl:    FLUoxetine (PROZAC) 20 MG capsule, Take 1 capsule by mouth daily., Disp: , Rfl:    fluticasone (FLONASE) 50 MCG/ACT nasal spray, Place 2 sprays  into both nostrils 2 (two) times daily., Disp:  16 g, Rfl: 5   Fluticasone-Umeclidin-Vilant (TRELEGY ELLIPTA) 200-62.5-25 MCG/ACT AEPB, Inhale 1 puff into the lungs daily., Disp: 28 each, Rfl: 5   folic acid (FOLVITE) 1 MG tablet, TAKE ONE TABLET BY MOUTH ONCE DAILY, Disp: 30 tablet, Rfl: 5   gabapentin (NEURONTIN) 300 MG capsule, , Disp: , Rfl:    ipratropium (ATROVENT) 0.06 % nasal spray, USE 2 SPRAYS IN EACH NOSTRIL THREE TIMES A DAY AS NEEDED., Disp: 15 mL, Rfl: 5   Lactulose 20 GM/30ML SOLN, Take 30 mLs (20 g total) by mouth daily as needed. Take 30 ml by mouth every 3 hours until bowel movement then take daily as needed for constipation, Disp: 450 mL, Rfl: 2   megestrol (MEGACE) 400 MG/10ML suspension, Take 10 mLs (400 mg total) by mouth 2 (two) times daily., Disp: 480 mL, Rfl: 2   montelukast (SINGULAIR) 10 MG tablet, TAKE ONE TABLET BY MOUTH AT BEDTIME., Disp: 30 tablet, Rfl: 5   naproxen (NAPROSYN) 500 MG tablet, Take 500 mg by mouth 2 (two) times daily., Disp: , Rfl:    NON FORMULARY, [facility]  Antifungal (nail)-#1, Disp: , Rfl:    NON FORMULARY, [facility]  Anti-fungal (nail)-#1, Disp: , Rfl:    omeprazole (PRILOSEC) 20 MG capsule, Take 1 capsule (20 mg total) by mouth daily., Disp: 30 capsule, Rfl: 3   oxyCODONE-acetaminophen (PERCOCET/ROXICET) 5-325 MG tablet, Take 1 tablet by mouth every 6 (six) hours as needed for severe pain., Disp: 20 tablet, Rfl: 0   pantoprazole (PROTONIX) 40 MG tablet, Take 40 mg by mouth daily., Disp: , Rfl:    Respiratory Therapy Supplies (FLUTTER) DEVI, Use as directed, Disp: 1 each, Rfl: 3   SF 5000 PLUS 1.1 % CREA dental cream, , Disp: , Rfl:    Spacer/Aero-Holding Chambers (AEROCHAMBER PLUS WITH MASK) inhaler, 1 each by Other route See admin instructions. Use with inhaler as instructed., Disp: 1 each, Rfl: 1   traZODone (DESYREL) 50 MG tablet, TAKE ONE TABLET BY MOUTH AT BEDTIME, Disp: 30 tablet, Rfl: 1   triamcinolone cream (KENALOG) 0.1 %, , Disp: , Rfl:    HYDROcodone  bit-homatropine (HYCODAN) 5-1.5 MG/5ML syrup, TAKE ONE TEASPOONFUL (5 ml) BY MOUTH EVERY 4 HOURS, Disp: 120 mL, Rfl: 0   lidocaine-prilocaine (EMLA) cream, Apply a quarter sized amount to port a cath site and cover with plastic wrap one hour prior to infusion appointments, Disp: 30 g, Rfl: 3   prochlorperazine (COMPAZINE) 10 MG tablet, Take 1 tablet (10 mg total) by mouth every 6 (six) hours as needed for nausea or vomiting., Disp: 30 tablet, Rfl: 0   prochlorperazine (COMPAZINE) 10 MG tablet, Take 1 tablet (10 mg total) by mouth every 6 (six) hours as needed for nausea or vomiting., Disp: 60 tablet, Rfl: 3  Current Facility-Administered Medications:    tezepelumab-ekko (TEZSPIRE) 210 MG/1. syringe 210 mg, 210 mg, Subcutaneous, Q28 days, Marcelyn Bruins, MD, 210 mg at 10/18/22 9604   Allergies: Allergies  Allergen Reactions   Levonorgestrel-Ethinyl Estrad Cough   Sulfa Antibiotics Other (See Comments)    Unknown- pt unsure of reaction; believes it may be nausea   Sulfamethoxazole-Trimethoprim Other (See Comments)   Cephalosporins Other (See Comments)    Other Reaction: Toxicity    REVIEW OF SYSTEMS:   Review of Systems  Constitutional:  Negative for appetite change, chills, fatigue and fever.  HENT:   Negative for lump/mass, mouth sores, nosebleeds, sore throat and trouble  swallowing.   Eyes:  Negative for eye problems.  Respiratory:  Positive for cough and shortness of breath (secondary to asthma and allergies).   Cardiovascular:  Positive for palpitations. Negative for chest pain and leg swelling.  Gastrointestinal:  Positive for constipation and nausea (occasional). Negative for abdominal pain, diarrhea and vomiting.  Genitourinary:  Negative for bladder incontinence, difficulty urinating, dysuria, frequency, hematuria and nocturia.   Musculoskeletal:  Negative for arthralgias, back pain, flank pain, myalgias and neck pain.  Skin:  Negative for itching and rash.   Neurological:  Negative for dizziness, headaches and numbness.  Hematological:  Does not bruise/bleed easily.  Psychiatric/Behavioral:  Negative for depression, sleep disturbance and suicidal ideas. The patient is not nervous/anxious.   All other systems reviewed and are negative.    VITALS:   There were no vitals taken for this visit.  Wt Readings from Last 3 Encounters:  10/25/22 139 lb 6.4 oz (63.2 kg)  10/04/22 143 lb 12.8 oz (65.2 kg)  09/27/22 142 lb 3.2 oz (64.5 kg)    There is no height or weight on file to calculate BMI.  Performance status (ECOG): 1 - Symptomatic but completely ambulatory  PHYSICAL EXAM:   Physical Exam Vitals and nursing note reviewed. Exam conducted with a chaperone present.  Constitutional:      Appearance: Normal appearance.  Cardiovascular:     Rate and Rhythm: Normal rate and regular rhythm.     Pulses: Normal pulses.     Heart sounds: Normal heart sounds.  Pulmonary:     Effort: Pulmonary effort is normal.     Breath sounds: Normal breath sounds.  Abdominal:     Palpations: Abdomen is soft. There is no hepatomegaly, splenomegaly or mass.     Tenderness: There is no abdominal tenderness.  Musculoskeletal:     Right lower leg: No edema.     Left lower leg: No edema.  Lymphadenopathy:     Cervical: No cervical adenopathy.     Right cervical: No superficial, deep or posterior cervical adenopathy.    Left cervical: No superficial, deep or posterior cervical adenopathy.     Upper Body:     Right upper body: No supraclavicular or axillary adenopathy.     Left upper body: No supraclavicular or axillary adenopathy.  Neurological:     General: No focal deficit present.     Mental Status: She is alert and oriented to person, place, and time.  Psychiatric:        Mood and Affect: Mood normal.        Behavior: Behavior normal.     LABS:      Latest Ref Rng & Units 10/25/2022    8:34 AM 09/27/2022   10:28 AM 09/06/2022   11:44 AM  CBC   WBC 4.0 - 10.5 K/uL 2.5  2.3  2.1   Hemoglobin 12.0 - 15.0 g/dL 9.5  9.8  9.0   Hematocrit 36.0 - 46.0 % 30.7  31.4  28.9   Platelets 150 - 400 K/uL 227  226  98       Latest Ref Rng & Units 10/25/2022    8:34 AM 09/27/2022   10:28 AM 09/06/2022   11:44 AM  CMP  Glucose 70 - 99 mg/dL 99  893  92   BUN 8 - 23 mg/dL 8  14  17    Creatinine 0.44 - 1.00 mg/dL 8.10  1.75  1.02   Sodium 135 - 145 mmol/L 137  136  137  Potassium 3.5 - 5.1 mmol/L 3.8  3.8  4.2   Chloride 98 - 111 mmol/L 105  104  106   CO2 22 - 32 mmol/L 25  26  25    Calcium 8.9 - 10.3 mg/dL 9.1  9.2  9.3   Total Protein 6.5 - 8.1 g/dL 6.9  7.1  7.0   Total Bilirubin 0.3 - 1.2 mg/dL 1.0  1.2  1.3   Alkaline Phos 38 - 126 U/L 58  62  47   AST 15 - 41 U/L 25  29  19    ALT 0 - 44 U/L 11  14  14       No results found for: "CEA1", "CEA" / No results found for: "CEA1", "CEA" No results found for: "PSA1" No results found for: "QMV784" No results found for: "CAN125"  Lab Results  Component Value Date   TOTALPROTELP 6.3 04/26/2022   ALBUMINELP 3.8 04/26/2022   A1GS 0.3 04/26/2022   A2GS 0.5 04/26/2022   BETS 0.8 04/26/2022   GAMS 0.9 04/26/2022   MSPIKE Not Observed 04/26/2022   SPEI Comment 04/26/2022   Lab Results  Component Value Date   TIBC 343 04/26/2022   FERRITIN 114 04/26/2022   IRONPCTSAT 25 04/26/2022   Lab Results  Component Value Date   LDH 531 (H) 10/25/2022   LDH 305 (H) 08/30/2022   LDH 420 (H) 07/08/2022     STUDIES:   No results found.

## 2022-10-25 NOTE — Patient Instructions (Signed)
[facility]  Discharge Instructions: Thank you for choosing [facility] to provide your oncology and hematology care.  If you have a lab appointment with the Cancer Center - please note that after April 8th, 2024, all labs will be drawn in the cancer center.  You do not have to check in or register with the main entrance as you have in the past but will complete your check-in in the cancer center.  Wear comfortable clothing and clothing appropriate for easy access to any Portacath or PICC line.   We strive to give you quality time with your provider. You may need to reschedule your appointment if you arrive late (15 or more minutes).  Arriving late affects you and other patients whose appointments are after yours.  Also, if you miss three or more appointments without notifying the office, you may be dismissed from the clinic at the provider's discretion.      For prescription refill requests, have your pharmacy contact our office and allow 72 hours for refills to be completed.    Today you received the following chemotherapy and/or immunotherapy agents D1 Vidaza   To help prevent nausea and vomiting after your treatment, we encourage you to take your nausea medication as directed.  Azacitidine Injection What is this medication? AZACITIDINE (ay za SITE i deen) treats blood and bone marrow cancers. It works by slowing down the growth of cancer cells. This medicine may be used for other purposes; ask your health care provider or pharmacist if you have questions. COMMON BRAND NAME(S): Vidaza What should I tell my care team before I take this medication? They need to know if you have any of these conditions: Kidney disease Liver disease Low blood cell levels, such as low white cells, platelets, or red blood cells Low levels of albumin in the blood Low levels of bicarbonate in the blood An unusual or allergic reaction to azacitidine, mannitol, other  medications, foods, dyes, or preservatives If you or your partner are pregnant or trying to get pregnant Breast-feeding How should I use this medication? This medication is injected into a vein or under the skin. It is given by your care team in a hospital or clinic setting. Talk to your care team about the use of this medication in children. While it may be prescribed for children as young as 1 month for selected conditions, precautions do apply. Overdosage: If you think you have taken too much of this medicine contact a poison control center or emergency room at once. NOTE: This medicine is only for you. Do not share this medicine with others. What if I miss a dose? Keep appointments for follow-up doses. It is important not to miss your dose. Call your care team if you are unable to keep an appointment. What may interact with this medication? Interactions are not expected. This list may not describe all possible interactions. Give your health care provider a list of all the medicines, herbs, non-prescription drugs, or dietary supplements you use. Also tell them if you smoke, drink alcohol, or use illegal drugs. Some items may interact with your medicine. What should I watch for while using this medication? Your condition will be monitored carefully while you are receiving this medication. You may need blood work while taking this medication. This medication may make you feel generally unwell. This is not uncommon as chemotherapy can affect healthy cells as well as cancer cells. Report any side effects. Continue your course of treatment  even though you feel ill unless your care team tells you to stop. Other product types may be available that contain the medication azacitidine. The injection and oral products should not be used in place of one another. Talk to your care team if you have questions. This medication can cause serious side effects. To reduce the risk, your care team may give you other  medications to take before receiving this one. Be sure to follow the directions from your care team. This medication may increase your risk of getting an infection. Call your care team for advice if you get a fever, chills, sore throat, or other symptoms of a cold or flu. Do not treat yourself. Try to avoid being around people who are sick. Avoid taking medications that contain aspirin, acetaminophen, ibuprofen, naproxen, or ketoprofen unless instructed by your care team. These medications may hide a fever. This medication may increase your risk to bruise or bleed. Call your care team if you notice any unusual bleeding. Be careful brushing or flossing your teeth or using a toothpick because you may get an infection or bleed more easily. If you have any dental work done, tell your dentist you are receiving this medication. Talk to your care team if you or your partner may be pregnant. Serious birth defects can occur if you take this medication during pregnancy and for 6 months after the last dose. You will need a negative pregnancy test before starting this medication. Contraception is recommended while taking his medication and for 6 months after the last dose. Your care team can help you find the option that works for you. If your partner can get pregnant, use a condom during sex while taking this medication and for 3 months after the last dose. Do not breastfeed while taking this medication and for 1 week after the last dose. This medication may cause infertility. Talk to your care team if you are concerned about your fertility. What side effects may I notice from receiving this medication? Side effects that you should report to your care team as soon as possible: Allergic reactions--skin rash, itching, hives, swelling of the face, lips, tongue, or throat Infection--fever, chills, cough, sore throat, wounds that don't heal, pain or trouble when passing urine, general feeling of discomfort or being  unwell Kidney injury--decrease in the amount of urine, swelling of the ankles, hands, or feet Liver injury--right upper belly pain, loss of appetite, nausea, light-colored stool, dark yellow or brown urine, yellowing skin or eyes, unusual weakness or fatigue Low red blood cell level--unusual weakness or fatigue, dizziness, headache, trouble breathing Tumor lysis syndrome (TLS)--nausea, vomiting, diarrhea, decrease in the amount of urine, dark urine, unusual weakness or fatigue, confusion, muscle pain or cramps, fast or irregular heartbeat, joint pain Unusual bruising or bleeding Side effects that usually do not require medical attention (report to your care team if they continue or are bothersome): Constipation Diarrhea Nausea Pain, redness, or irritation at injection site Vomiting This list may not describe all possible side effects. Call your doctor for medical advice about side effects. You may report side effects to FDA at 1-800-FDA-1088. Where should I keep my medication? This medication is given in a hospital or clinic. It will not be stored at home. NOTE: This sheet is a summary. It may not cover all possible information. If you have questions about this medicine, talk to your doctor, pharmacist, or health care provider.  2023 Elsevier/Gold Standard (2021-11-19 00:00:00)   BELOW ARE SYMPTOMS THAT SHOULD BE  REPORTED IMMEDIATELY: *FEVER GREATER THAN 100.4 F (38 C) OR HIGHER *CHILLS OR SWEATING *NAUSEA AND VOMITING THAT IS NOT CONTROLLED WITH YOUR NAUSEA MEDICATION *UNUSUAL SHORTNESS OF BREATH *UNUSUAL BRUISING OR BLEEDING *URINARY PROBLEMS (pain or burning when urinating, or frequent urination) *BOWEL PROBLEMS (unusual diarrhea, constipation, pain near the anus) TENDERNESS IN MOUTH AND THROAT WITH OR WITHOUT PRESENCE OF ULCERS (sore throat, sores in mouth, or a toothache) UNUSUAL RASH, SWELLING OR PAIN  UNUSUAL VAGINAL DISCHARGE OR ITCHING   Items with * indicate a potential  emergency and should be followed up as soon as possible or go to the Emergency Department if any problems should occur.  Please show the CHEMOTHERAPY ALERT CARD or IMMUNOTHERAPY ALERT CARD at check-in to the Emergency Department and triage nurse.  Should you have questions after your visit or need to cancel or reschedule your appointment, please contact [facility] [phone]  and follow the prompts.  Office hours are 8:00 a.m. to 4:30 p.m. Monday - Friday. Please note that voicemails left after 4:00 p.m. may not be returned until the following business day.  We are closed weekends and major holidays. You have access to a nurse at all times for urgent questions. Please call the main number to the clinic [phone] and follow the prompts.  For any non-urgent questions, you may also contact your provider using MyChart. We now offer e-Visits for anyone 66 and older to request care online for non-urgent symptoms. For details visit [email or web address].   Also download the MyChart app! Go to the app store, search "MyChart", open the app, select [place], and log in with your MyChart username and password.

## 2022-10-25 NOTE — Progress Notes (Signed)
Patients port flushed without difficulty.  Good blood return noted with no bruising or swelling noted at site.  Stable during access and blood draw.  Patient to remain accessed for treatment. 

## 2022-10-25 NOTE — Progress Notes (Signed)
Patient presents today for D1 Vidaza infusion. Patient is in satisfactory condition with no new complaints voiced.  Vital signs are stable.  Labs reviewed by Dr. Ellin Saba during the office visit and all labs are within treatment parameters.  We will proceed with treatment per MD orders.   After chemotherapy was finished, pt stated she felt a little lightheaded BP was 99/63. Pt was given a snack and rested for 10 minutes. BP re-checked 106/68. Pt stated she felt better.  D1 Vidaza given today per MD orders. Tolerated infusion without adverse affects. Vital signs stable. No complaints at this time. Discharged from clinic via wheelchair in stable condition. Alert and oriented x 3. F/U with [facility] as scheduled.

## 2022-10-25 NOTE — Progress Notes (Signed)
Patient has been examined by Dr. Katragadda. Vital signs and labs have been reviewed by MD - ANC, Creatinine, LFTs, hemoglobin, and platelets are within treatment parameters per M.D. - pt may proceed with treatment.  Primary RN and pharmacy notified.  

## 2022-10-25 NOTE — Patient Instructions (Signed)
[facility] Discharge Instructions   You were seen and examined today by Dr. Katragadda.  He reviewed the results of your lab work which are normal/stable.   We will proceed with your treatment today.  Return as scheduled.    Thank you for choosing [facility] to provide your oncology and hematology care.  To afford each patient quality time with our provider, please arrive at least 15 minutes before your scheduled appointment time.   If you have a lab appointment with the Cancer Center please come in thru the Main Entrance and check in at the main information desk.  You need to re-schedule your appointment should you arrive 10 or more minutes late.  We strive to give you quality time with our providers, and arriving late affects you and other patients whose appointments are after yours.  Also, if you no show three or more times for appointments you may be dismissed from the clinic at the providers discretion.     Again, thank you for choosing [facility].  Our hope is that these requests will decrease the amount of time that you wait before being seen by our physicians.       _____________________________________________________________  Should you have questions after your visit to [facility], please contact our office at [phone] and follow the prompts.  Our office hours are 8:00 a.m. and 4:30 p.m. Monday - Friday.  Please note that voicemails left after 4:00 p.m. may not be returned until the following business day.  We are closed weekends and major holidays.  You do have access to a nurse 24-7, just call the main number to the clinic [phone] and do not press any options, hold on the line and a nurse will answer the phone.    For prescription refill requests, have your pharmacy contact our office and allow 72 hours.    Due to Covid, you will need to wear a mask upon entering  the hospital. If you do not have a mask, a mask will be given to you at the Main Entrance upon arrival. For doctor visits, patients may have 1 support person age 18 or older with them. For treatment visits, patients can not have anyone with them due to social distancing guidelines and our immunocompromised population.      

## 2022-10-26 ENCOUNTER — Inpatient Hospital Stay: Payer: Medicare HMO

## 2022-10-26 ENCOUNTER — Other Ambulatory Visit: Payer: Self-pay | Admitting: Hematology

## 2022-10-26 ENCOUNTER — Other Ambulatory Visit: Payer: Self-pay

## 2022-10-26 VITALS — BP 102/88 | HR 76 | Temp 97.8°F | Resp 16

## 2022-10-26 DIAGNOSIS — D696 Thrombocytopenia, unspecified: Secondary | ICD-10-CM | POA: Diagnosis not present

## 2022-10-26 DIAGNOSIS — C9310 Chronic myelomonocytic leukemia not having achieved remission: Secondary | ICD-10-CM

## 2022-10-26 DIAGNOSIS — I509 Heart failure, unspecified: Secondary | ICD-10-CM | POA: Diagnosis not present

## 2022-10-26 DIAGNOSIS — D539 Nutritional anemia, unspecified: Secondary | ICD-10-CM | POA: Diagnosis not present

## 2022-10-26 DIAGNOSIS — E538 Deficiency of other specified B group vitamins: Secondary | ICD-10-CM | POA: Diagnosis not present

## 2022-10-26 DIAGNOSIS — Z5111 Encounter for antineoplastic chemotherapy: Secondary | ICD-10-CM | POA: Diagnosis not present

## 2022-10-26 DIAGNOSIS — K5900 Constipation, unspecified: Secondary | ICD-10-CM | POA: Diagnosis not present

## 2022-10-26 DIAGNOSIS — R634 Abnormal weight loss: Secondary | ICD-10-CM | POA: Diagnosis not present

## 2022-10-26 DIAGNOSIS — Z79899 Other long term (current) drug therapy: Secondary | ICD-10-CM | POA: Diagnosis not present

## 2022-10-26 DIAGNOSIS — Z95828 Presence of other vascular implants and grafts: Secondary | ICD-10-CM

## 2022-10-26 MED ORDER — SODIUM CHLORIDE 0.9 % IV SOLN: Freq: Once | INTRAVENOUS | Status: AC

## 2022-10-26 MED ORDER — SODIUM CHLORIDE 0.9 % IV SOLN: Freq: Once | INTRAVENOUS | Status: DC

## 2022-10-26 MED ORDER — HEPARIN SOD (PORK) LOCK FLUSH 100 UNIT/ML IV SOLN
500.0000 [IU] | Freq: Once | INTRAVENOUS | Status: AC
  Administered 2022-10-26: 500 [IU] via INTRAVENOUS

## 2022-10-26 MED ORDER — SODIUM CHLORIDE 0.9% FLUSH
10.0000 mL | Freq: Once | INTRAVENOUS | Status: AC
  Administered 2022-10-26: 10 mL via INTRAVENOUS

## 2022-10-26 MED ORDER — SODIUM CHLORIDE 0.9 % IV SOLN
75.0000 mg/m2 | Freq: Once | INTRAVENOUS | Status: AC
  Administered 2022-10-26: 126 mg via INTRAVENOUS
  Filled 2022-10-26: qty 12.6

## 2022-10-26 MED FILL — Azacitidine For Inj 100 MG: INTRAMUSCULAR | Qty: 12.6 | Status: AC

## 2022-10-26 NOTE — Patient Instructions (Signed)
[facility]  Discharge Instructions: Thank you for choosing [facility] to provide your oncology and hematology care.  If you have a lab appointment with the Cancer Center - please note that after April 8th, 2024, all labs will be drawn in the cancer center.  You do not have to check in or register with the main entrance as you have in the past but will complete your check-in in the cancer center.  Wear comfortable clothing and clothing appropriate for easy access to any Portacath or PICC line.   We strive to give you quality time with your provider. You may need to reschedule your appointment if you arrive late (15 or more minutes).  Arriving late affects you and other patients whose appointments are after yours.  Also, if you miss three or more appointments without notifying the office, you may be dismissed from the clinic at the provider's discretion.      For prescription refill requests, have your pharmacy contact our office and allow 72 hours for refills to be completed.    Today you received the following chemotherapy and/or immunotherapy agents Vidaza      To help prevent nausea and vomiting after your treatment, we encourage you to take your nausea medication as directed.  BELOW ARE SYMPTOMS THAT SHOULD BE REPORTED IMMEDIATELY: *FEVER GREATER THAN 100.4 F (38 C) OR HIGHER *CHILLS OR SWEATING *NAUSEA AND VOMITING THAT IS NOT CONTROLLED WITH YOUR NAUSEA MEDICATION *UNUSUAL SHORTNESS OF BREATH *UNUSUAL BRUISING OR BLEEDING *URINARY PROBLEMS (pain or burning when urinating, or frequent urination) *BOWEL PROBLEMS (unusual diarrhea, constipation, pain near the anus) TENDERNESS IN MOUTH AND THROAT WITH OR WITHOUT PRESENCE OF ULCERS (sore throat, sores in mouth, or a toothache) UNUSUAL RASH, SWELLING OR PAIN  UNUSUAL VAGINAL DISCHARGE OR ITCHING   Items with * indicate a potential emergency and should be followed up as soon as possible or go to the  Emergency Department if any problems should occur.  Please show the CHEMOTHERAPY ALERT CARD or IMMUNOTHERAPY ALERT CARD at check-in to the Emergency Department and triage nurse.  Should you have questions after your visit or need to cancel or reschedule your appointment, please contact [facility] [phone]  and follow the prompts.  Office hours are 8:00 a.m. to 4:30 p.m. Monday - Friday. Please note that voicemails left after 4:00 p.m. may not be returned until the following business day.  We are closed weekends and major holidays. You have access to a nurse at all times for urgent questions. Please call the main number to the clinic [phone] and follow the prompts.  For any non-urgent questions, you may also contact your provider using MyChart. We now offer e-Visits for anyone 18 and older to request care online for non-urgent symptoms. For details visit [email or web address].   Also download the MyChart app! Go to the app store, search "MyChart", open the app, select [place], and log in with your MyChart username and password.

## 2022-10-26 NOTE — Progress Notes (Signed)
Patient presents today for D2 Vidaza infusion per providers order.  Vital signs WNL.  Patient complains of Head ache since yesterday and has been taking Tylenol with no relief.  MD notified.  Message received from Chapman Moss RN/Dr. Lyndee Hensen to proceed with treatment, patient will receive 500 cc normal saline bolus with treatment.  Treatment given today per MD orders.  Stable during infusion without adverse affects.  Vital signs stable.  No complaints at this time.  Discharge from clinic ambulatory in stable condition.  Alert and oriented X 3.  Follow up with [facility] as scheduled.

## 2022-10-27 ENCOUNTER — Inpatient Hospital Stay: Payer: Medicare HMO

## 2022-10-27 ENCOUNTER — Other Ambulatory Visit: Payer: Self-pay

## 2022-10-27 VITALS — BP 111/69 | HR 79 | Temp 98.1°F | Resp 18

## 2022-10-27 DIAGNOSIS — K5900 Constipation, unspecified: Secondary | ICD-10-CM | POA: Diagnosis not present

## 2022-10-27 DIAGNOSIS — I509 Heart failure, unspecified: Secondary | ICD-10-CM | POA: Diagnosis not present

## 2022-10-27 DIAGNOSIS — R634 Abnormal weight loss: Secondary | ICD-10-CM | POA: Diagnosis not present

## 2022-10-27 DIAGNOSIS — D696 Thrombocytopenia, unspecified: Secondary | ICD-10-CM | POA: Diagnosis not present

## 2022-10-27 DIAGNOSIS — C9310 Chronic myelomonocytic leukemia not having achieved remission: Secondary | ICD-10-CM

## 2022-10-27 DIAGNOSIS — D539 Nutritional anemia, unspecified: Secondary | ICD-10-CM | POA: Diagnosis not present

## 2022-10-27 DIAGNOSIS — E538 Deficiency of other specified B group vitamins: Secondary | ICD-10-CM | POA: Diagnosis not present

## 2022-10-27 DIAGNOSIS — Z5111 Encounter for antineoplastic chemotherapy: Secondary | ICD-10-CM | POA: Diagnosis not present

## 2022-10-27 DIAGNOSIS — Z79899 Other long term (current) drug therapy: Secondary | ICD-10-CM | POA: Diagnosis not present

## 2022-10-27 DIAGNOSIS — Z95828 Presence of other vascular implants and grafts: Secondary | ICD-10-CM

## 2022-10-27 MED ORDER — SODIUM CHLORIDE 0.9% FLUSH
10.0000 mL | Freq: Once | INTRAVENOUS | Status: AC
  Administered 2022-10-27: 10 mL via INTRAVENOUS

## 2022-10-27 MED ORDER — SODIUM CHLORIDE 0.9 % IV SOLN
75.0000 mg/m2 | Freq: Once | INTRAVENOUS | Status: AC
  Administered 2022-10-27: 126 mg via INTRAVENOUS
  Filled 2022-10-27: qty 12.6

## 2022-10-27 MED ORDER — SODIUM CHLORIDE 0.9 % IV SOLN: Freq: Once | INTRAVENOUS | Status: AC

## 2022-10-27 MED ORDER — PALONOSETRON HCL INJECTION 0.25 MG/5ML
0.2500 mg | Freq: Once | INTRAVENOUS | Status: AC
  Administered 2022-10-27: 0.25 mg via INTRAVENOUS
  Filled 2022-10-27: qty 5

## 2022-10-27 MED ORDER — HEPARIN SOD (PORK) LOCK FLUSH 100 UNIT/ML IV SOLN
500.0000 [IU] | Freq: Once | INTRAVENOUS | Status: AC
  Administered 2022-10-27: 500 [IU] via INTRAVENOUS

## 2022-10-27 NOTE — Patient Instructions (Signed)
[facility] [place]  Discharge Instructions: Thank you for choosing [facility] to provide your oncology and hematology care.  If you have a lab appointment with the Cancer Center - please note that after April 8th, 2024, all labs will be drawn in the cancer center.  You do not have to check in or register with the main entrance as you have in the past but will complete your check-in in the cancer center.  Wear comfortable clothing and clothing appropriate for easy access to any Portacath or PICC line.   We strive to give you quality time with your provider. You may need to reschedule your appointment if you arrive late (15 or more minutes).  Arriving late affects you and other patients whose appointments are after yours.  Also, if you miss three or more appointments without notifying the office, you may be dismissed from the clinic at the provider's discretion.      For prescription refill requests, have your pharmacy contact our office and allow 72 hours for refills to be completed.    Today you received the following chemotherapy and/or immunotherapy agents Vidaza.  Azacitidine Injection What is this medication? AZACITIDINE (ay za SITE i deen) treats blood and bone marrow cancers. It works by slowing down the growth of cancer cells. This medicine may be used for other purposes; ask your health care provider or pharmacist if you have questions. COMMON BRAND NAME(S): Vidaza What should I tell my care team before I take this medication? They need to know if you have any of these conditions: Kidney disease Liver disease Low blood cell levels, such as low white cells, platelets, or red blood cells Low levels of albumin in the blood Low levels of bicarbonate in the blood An unusual or allergic reaction to azacitidine, mannitol, other medications, foods, dyes, or preservatives If you or your partner are pregnant or trying to get pregnant Breast-feeding How should I  use this medication? This medication is injected into a vein or under the skin. It is given by your care team in a hospital or clinic setting. Talk to your care team about the use of this medication in children. While it may be prescribed for children as young as 1 month for selected conditions, precautions do apply. Overdosage: If you think you have taken too much of this medicine contact a poison control center or emergency room at once. NOTE: This medicine is only for you. Do not share this medicine with others. What if I miss a dose? Keep appointments for follow-up doses. It is important not to miss your dose. Call your care team if you are unable to keep an appointment. What may interact with this medication? Interactions are not expected. This list may not describe all possible interactions. Give your health care provider a list of all the medicines, herbs, non-prescription drugs, or dietary supplements you use. Also tell them if you smoke, drink alcohol, or use illegal drugs. Some items may interact with your medicine. What should I watch for while using this medication? Your condition will be monitored carefully while you are receiving this medication. You may need blood work while taking this medication. This medication may make you feel generally unwell. This is not uncommon as chemotherapy can affect healthy cells as well as cancer cells. Report any side effects. Continue your course of treatment even though you feel ill unless your care team tells you to stop. Other product types may be available that contain the   medication azacitidine. The injection and oral products should not be used in place of one another. Talk to your care team if you have questions. This medication can cause serious side effects. To reduce the risk, your care team may give you other medications to take before receiving this one. Be sure to follow the directions from your care team. This medication may increase your  risk of getting an infection. Call your care team for advice if you get a fever, chills, sore throat, or other symptoms of a cold or flu. Do not treat yourself. Try to avoid being around people who are sick. Avoid taking medications that contain aspirin, acetaminophen, ibuprofen, naproxen, or ketoprofen unless instructed by your care team. These medications may hide a fever. This medication may increase your risk to bruise or bleed. Call your care team if you notice any unusual bleeding. Be careful brushing or flossing your teeth or using a toothpick because you may get an infection or bleed more easily. If you have any dental work done, tell your dentist you are receiving this medication. Talk to your care team if you or your partner may be pregnant. Serious birth defects can occur if you take this medication during pregnancy and for 6 months after the last dose. You will need a negative pregnancy test before starting this medication. Contraception is recommended while taking his medication and for 6 months after the last dose. Your care team can help you find the option that works for you. If your partner can get pregnant, use a condom during sex while taking this medication and for 3 months after the last dose. Do not breastfeed while taking this medication and for 1 week after the last dose. This medication may cause infertility. Talk to your care team if you are concerned about your fertility. What side effects may I notice from receiving this medication? Side effects that you should report to your care team as soon as possible: Allergic reactions--skin rash, itching, hives, swelling of the face, lips, tongue, or throat Infection--fever, chills, cough, sore throat, wounds that don't heal, pain or trouble when passing urine, general feeling of discomfort or being unwell Kidney injury--decrease in the amount of urine, swelling of the ankles, hands, or feet Liver injury--right upper belly pain, loss  of appetite, nausea, light-colored stool, dark yellow or Lexii Walsh urine, yellowing skin or eyes, unusual weakness or fatigue Low red blood cell level--unusual weakness or fatigue, dizziness, headache, trouble breathing Tumor lysis syndrome (TLS)--nausea, vomiting, diarrhea, decrease in the amount of urine, dark urine, unusual weakness or fatigue, confusion, muscle pain or cramps, fast or irregular heartbeat, joint pain Unusual bruising or bleeding Side effects that usually do not require medical attention (report to your care team if they continue or are bothersome): Constipation Diarrhea Nausea Pain, redness, or irritation at injection site Vomiting This list may not describe all possible side effects. Call your doctor for medical advice about side effects. You may report side effects to FDA at 1-800-FDA-1088. Where should I keep my medication? This medication is given in a hospital or clinic. It will not be stored at home. NOTE: This sheet is a summary. It may not cover all possible information. If you have questions about this medicine, talk to your doctor, pharmacist, or health care provider.  2023 Elsevier/Gold Standard (2021-11-19 00:00:00)        To help prevent nausea and vomiting after your treatment, we encourage you to take your nausea medication as directed.  BELOW ARE SYMPTOMS   THAT SHOULD BE REPORTED IMMEDIATELY: *FEVER GREATER THAN 100.4 F (38 C) OR HIGHER *CHILLS OR SWEATING *NAUSEA AND VOMITING THAT IS NOT CONTROLLED WITH YOUR NAUSEA MEDICATION *UNUSUAL SHORTNESS OF BREATH *UNUSUAL BRUISING OR BLEEDING *URINARY PROBLEMS (pain or burning when urinating, or frequent urination) *BOWEL PROBLEMS (unusual diarrhea, constipation, pain near the anus) TENDERNESS IN MOUTH AND THROAT WITH OR WITHOUT PRESENCE OF ULCERS (sore throat, sores in mouth, or a toothache) UNUSUAL RASH, SWELLING OR PAIN  UNUSUAL VAGINAL DISCHARGE OR ITCHING   Items with * indicate a potential emergency and  should be followed up as soon as possible or go to the Emergency Department if any problems should occur.  Please show the CHEMOTHERAPY ALERT CARD or IMMUNOTHERAPY ALERT CARD at check-in to the Emergency Department and triage nurse.  Should you have questions after your visit or need to cancel or reschedule your appointment, please contact [facility] [phone]  and follow the prompts.  Office hours are 8:00 a.m. to 4:30 p.m. Monday - Friday. Please note that voicemails left after 4:00 p.m. may not be returned until the following business day.  We are closed weekends and major holidays. You have access to a nurse at all times for urgent questions. Please call the main number to the clinic [phone] and follow the prompts.  For any non-urgent questions, you may also contact your provider using MyChart. We now offer e-Visits for anyone 18 and older to request care online for non-urgent symptoms. For details visit [email or web address].   Also download the MyChart app! Go to the app store, search "MyChart", open the app, select [facility], and log in with your MyChart username and password.   

## 2022-10-27 NOTE — Progress Notes (Signed)
Patient presents today for chemotherapy infusion.  Patient is in satisfactory condition with no new complaints voiced.  Vital signs are stable.  We will proceed with treatment per MD orders.  Patient tolerated treatment well with no complaints voiced.  Patient left ambulatory in stable condition.  Vital signs stable at discharge.  Follow up as scheduled.    

## 2022-10-28 ENCOUNTER — Inpatient Hospital Stay: Payer: Medicare HMO

## 2022-10-28 VITALS — BP 101/71 | HR 71 | Temp 97.0°F | Resp 18

## 2022-10-28 DIAGNOSIS — D696 Thrombocytopenia, unspecified: Secondary | ICD-10-CM | POA: Diagnosis not present

## 2022-10-28 DIAGNOSIS — Z95828 Presence of other vascular implants and grafts: Secondary | ICD-10-CM

## 2022-10-28 DIAGNOSIS — Z79899 Other long term (current) drug therapy: Secondary | ICD-10-CM | POA: Diagnosis not present

## 2022-10-28 DIAGNOSIS — K5900 Constipation, unspecified: Secondary | ICD-10-CM | POA: Diagnosis not present

## 2022-10-28 DIAGNOSIS — Z5111 Encounter for antineoplastic chemotherapy: Secondary | ICD-10-CM | POA: Diagnosis not present

## 2022-10-28 DIAGNOSIS — C9310 Chronic myelomonocytic leukemia not having achieved remission: Secondary | ICD-10-CM

## 2022-10-28 DIAGNOSIS — E538 Deficiency of other specified B group vitamins: Secondary | ICD-10-CM | POA: Diagnosis not present

## 2022-10-28 DIAGNOSIS — R634 Abnormal weight loss: Secondary | ICD-10-CM | POA: Diagnosis not present

## 2022-10-28 DIAGNOSIS — I509 Heart failure, unspecified: Secondary | ICD-10-CM | POA: Diagnosis not present

## 2022-10-28 DIAGNOSIS — D539 Nutritional anemia, unspecified: Secondary | ICD-10-CM | POA: Diagnosis not present

## 2022-10-28 MED ORDER — SODIUM CHLORIDE 0.9% FLUSH
10.0000 mL | Freq: Once | INTRAVENOUS | Status: AC
  Administered 2022-10-28: 10 mL via INTRAVENOUS

## 2022-10-28 MED ORDER — SODIUM CHLORIDE 0.9 % IV SOLN
75.0000 mg/m2 | Freq: Once | INTRAVENOUS | Status: AC
  Administered 2022-10-28: 126 mg via INTRAVENOUS
  Filled 2022-10-28: qty 12.6

## 2022-10-28 MED ORDER — SODIUM CHLORIDE 0.9 % IV SOLN: Freq: Once | INTRAVENOUS | Status: AC

## 2022-10-28 MED ORDER — HEPARIN SOD (PORK) LOCK FLUSH 100 UNIT/ML IV SOLN
500.0000 [IU] | Freq: Once | INTRAVENOUS | Status: AC
  Administered 2022-10-28: 500 [IU] via INTRAVENOUS

## 2022-10-28 NOTE — Patient Instructions (Signed)
[facility] [place]  Discharge Instructions: Thank you for choosing [facility] to provide your oncology and hematology care.  If you have a lab appointment with the Cancer Center - please note that after April 8th, 2024, all labs will be drawn in the cancer center.  You do not have to check in or register with the main entrance as you have in the past but will complete your check-in in the cancer center.  Wear comfortable clothing and clothing appropriate for easy access to any Portacath or PICC line.   We strive to give you quality time with your provider. You may need to reschedule your appointment if you arrive late (15 or more minutes).  Arriving late affects you and other patients whose appointments are after yours.  Also, if you miss three or more appointments without notifying the office, you may be dismissed from the clinic at the provider's discretion.      For prescription refill requests, have your pharmacy contact our office and allow 72 hours for refills to be completed.    Today you received the following chemotherapy and/or immunotherapy agents Vidaza   To help prevent nausea and vomiting after your treatment, we encourage you to take your nausea medication as directed.  BELOW ARE SYMPTOMS THAT SHOULD BE REPORTED IMMEDIATELY: *FEVER GREATER THAN 100.4 F (38 C) OR HIGHER *CHILLS OR SWEATING *NAUSEA AND VOMITING THAT IS NOT CONTROLLED WITH YOUR NAUSEA MEDICATION *UNUSUAL SHORTNESS OF BREATH *UNUSUAL BRUISING OR BLEEDING *URINARY PROBLEMS (pain or burning when urinating, or frequent urination) *BOWEL PROBLEMS (unusual diarrhea, constipation, pain near the anus) TENDERNESS IN MOUTH AND THROAT WITH OR WITHOUT PRESENCE OF ULCERS (sore throat, sores in mouth, or a toothache) UNUSUAL RASH, SWELLING OR PAIN  UNUSUAL VAGINAL DISCHARGE OR ITCHING   Items with * indicate a potential emergency and should be followed up as soon as possible or go to the  Emergency Department if any problems should occur.  Please show the CHEMOTHERAPY ALERT CARD or IMMUNOTHERAPY ALERT CARD at check-in to the Emergency Department and triage nurse.  Should you have questions after your visit or need to cancel or reschedule your appointment, please contact [facility] [phone]  and follow the prompts.  Office hours are 8:00 a.m. to 4:30 p.m. Monday - Friday. Please note that voicemails left after 4:00 p.m. may not be returned until the following business day.  We are closed weekends and major holidays. You have access to a nurse at all times for urgent questions. Please call the main number to the clinic [phone] and follow the prompts.  For any non-urgent questions, you may also contact your provider using MyChart. We now offer e-Visits for anyone 18 and older to request care online for non-urgent symptoms. For details visit [email or web address].   Also download the MyChart app! Go to the app store, search "MyChart", open the app, select [facility], and log in with your MyChart username and password.   

## 2022-10-28 NOTE — Progress Notes (Signed)
Patient presents today for Vidaza infusion per providers order.  Vital signs within parameters for treatment.  Patient has no new complaints at this time.  Treatment given today per MD orders.  Stable during infusion without adverse affects.  Vital signs stable.  No complaints at this time.  Discharge from clinic ambulatory in stable condition.  Alert and oriented X 3.  Follow up with [facility] as scheduled.  

## 2022-10-29 ENCOUNTER — Inpatient Hospital Stay: Payer: Medicare HMO

## 2022-10-29 VITALS — BP 106/62 | HR 76 | Temp 97.3°F | Resp 18

## 2022-10-29 DIAGNOSIS — C9310 Chronic myelomonocytic leukemia not having achieved remission: Secondary | ICD-10-CM | POA: Diagnosis not present

## 2022-10-29 DIAGNOSIS — D539 Nutritional anemia, unspecified: Secondary | ICD-10-CM | POA: Diagnosis not present

## 2022-10-29 DIAGNOSIS — E538 Deficiency of other specified B group vitamins: Secondary | ICD-10-CM | POA: Diagnosis not present

## 2022-10-29 DIAGNOSIS — Z95828 Presence of other vascular implants and grafts: Secondary | ICD-10-CM

## 2022-10-29 DIAGNOSIS — K5900 Constipation, unspecified: Secondary | ICD-10-CM | POA: Diagnosis not present

## 2022-10-29 DIAGNOSIS — Z79899 Other long term (current) drug therapy: Secondary | ICD-10-CM | POA: Diagnosis not present

## 2022-10-29 DIAGNOSIS — I509 Heart failure, unspecified: Secondary | ICD-10-CM | POA: Diagnosis not present

## 2022-10-29 DIAGNOSIS — R634 Abnormal weight loss: Secondary | ICD-10-CM | POA: Diagnosis not present

## 2022-10-29 DIAGNOSIS — Z5111 Encounter for antineoplastic chemotherapy: Secondary | ICD-10-CM | POA: Diagnosis not present

## 2022-10-29 DIAGNOSIS — D696 Thrombocytopenia, unspecified: Secondary | ICD-10-CM | POA: Diagnosis not present

## 2022-10-29 MED ORDER — SODIUM CHLORIDE 0.9 % IV SOLN: Freq: Once | INTRAVENOUS | Status: AC

## 2022-10-29 MED ORDER — HEPARIN SOD (PORK) LOCK FLUSH 100 UNIT/ML IV SOLN
500.0000 [IU] | Freq: Once | INTRAVENOUS | Status: AC
  Administered 2022-10-29: 500 [IU] via INTRAVENOUS

## 2022-10-29 MED ORDER — SODIUM CHLORIDE 0.9% FLUSH
10.0000 mL | Freq: Once | INTRAVENOUS | Status: AC
  Administered 2022-10-29: 10 mL via INTRAVENOUS

## 2022-10-29 MED ORDER — PALONOSETRON HCL INJECTION 0.25 MG/5ML
0.2500 mg | Freq: Once | INTRAVENOUS | Status: AC
  Administered 2022-10-29: 0.25 mg via INTRAVENOUS
  Filled 2022-10-29: qty 5

## 2022-10-29 MED ORDER — SODIUM CHLORIDE 0.9 % IV SOLN
75.0000 mg/m2 | Freq: Once | INTRAVENOUS | Status: AC
  Administered 2022-10-29: 126 mg via INTRAVENOUS
  Filled 2022-10-29: qty 12.6

## 2022-10-29 NOTE — Patient Instructions (Signed)
[facility] [place]  Discharge Instructions: Thank you for choosing [facility] to provide your oncology and hematology care.  If you have a lab appointment with the Cancer Center - please note that after April 8th, 2024, all labs will be drawn in the cancer center.  You do not have to check in or register with the main entrance as you have in the past but will complete your check-in in the cancer center.  Wear comfortable clothing and clothing appropriate for easy access to any Portacath or PICC line.   We strive to give you quality time with your provider. You may need to reschedule your appointment if you arrive late (15 or more minutes).  Arriving late affects you and other patients whose appointments are after yours.  Also, if you miss three or more appointments without notifying the office, you may be dismissed from the clinic at the provider's discretion.      For prescription refill requests, have your pharmacy contact our office and allow 72 hours for refills to be completed.    Today you received the following chemotherapy and/or immunotherapy agents Vidaza   To help prevent nausea and vomiting after your treatment, we encourage you to take your nausea medication as directed.  BELOW ARE SYMPTOMS THAT SHOULD BE REPORTED IMMEDIATELY: *FEVER GREATER THAN 100.4 F (38 C) OR HIGHER *CHILLS OR SWEATING *NAUSEA AND VOMITING THAT IS NOT CONTROLLED WITH YOUR NAUSEA MEDICATION *UNUSUAL SHORTNESS OF BREATH *UNUSUAL BRUISING OR BLEEDING *URINARY PROBLEMS (pain or burning when urinating, or frequent urination) *BOWEL PROBLEMS (unusual diarrhea, constipation, pain near the anus) TENDERNESS IN MOUTH AND THROAT WITH OR WITHOUT PRESENCE OF ULCERS (sore throat, sores in mouth, or a toothache) UNUSUAL RASH, SWELLING OR PAIN  UNUSUAL VAGINAL DISCHARGE OR ITCHING   Items with * indicate a potential emergency and should be followed up as soon as possible or go to the  Emergency Department if any problems should occur.  Please show the CHEMOTHERAPY ALERT CARD or IMMUNOTHERAPY ALERT CARD at check-in to the Emergency Department and triage nurse.  Should you have questions after your visit or need to cancel or reschedule your appointment, please contact [facility] [phone]  and follow the prompts.  Office hours are 8:00 a.m. to 4:30 p.m. Monday - Friday. Please note that voicemails left after 4:00 p.m. may not be returned until the following business day.  We are closed weekends and major holidays. You have access to a nurse at all times for urgent questions. Please call the main number to the clinic [phone] and follow the prompts.  For any non-urgent questions, you may also contact your provider using MyChart. We now offer e-Visits for anyone 18 and older to request care online for non-urgent symptoms. For details visit [email or web address].   Also download the MyChart app! Go to the app store, search "MyChart", open the app, select [facility], and log in with your MyChart username and password.   

## 2022-10-29 NOTE — Progress Notes (Signed)
Patient presents today for Vidaza infusion per providers order.  Vital signs within parameters for treatment.  Patient has no new complaints at this time.  Treatment given today per MD orders.  Stable during infusion without adverse affects.  Vital signs stable.  No complaints at this time.  Discharge from clinic ambulatory in stable condition.  Alert and oriented X 3.  Follow up with [name] as scheduled.  

## 2022-11-01 ENCOUNTER — Inpatient Hospital Stay: Payer: Medicare HMO

## 2022-11-01 VITALS — BP 100/87 | HR 79 | Temp 97.6°F | Resp 18

## 2022-11-01 DIAGNOSIS — C9310 Chronic myelomonocytic leukemia not having achieved remission: Secondary | ICD-10-CM

## 2022-11-01 DIAGNOSIS — Z79899 Other long term (current) drug therapy: Secondary | ICD-10-CM | POA: Diagnosis not present

## 2022-11-01 DIAGNOSIS — D696 Thrombocytopenia, unspecified: Secondary | ICD-10-CM | POA: Diagnosis not present

## 2022-11-01 DIAGNOSIS — R634 Abnormal weight loss: Secondary | ICD-10-CM | POA: Diagnosis not present

## 2022-11-01 DIAGNOSIS — E538 Deficiency of other specified B group vitamins: Secondary | ICD-10-CM | POA: Diagnosis not present

## 2022-11-01 DIAGNOSIS — Z95828 Presence of other vascular implants and grafts: Secondary | ICD-10-CM

## 2022-11-01 DIAGNOSIS — K5900 Constipation, unspecified: Secondary | ICD-10-CM | POA: Diagnosis not present

## 2022-11-01 DIAGNOSIS — Z5111 Encounter for antineoplastic chemotherapy: Secondary | ICD-10-CM | POA: Diagnosis not present

## 2022-11-01 DIAGNOSIS — I509 Heart failure, unspecified: Secondary | ICD-10-CM | POA: Diagnosis not present

## 2022-11-01 DIAGNOSIS — D539 Nutritional anemia, unspecified: Secondary | ICD-10-CM | POA: Diagnosis not present

## 2022-11-01 LAB — COMPREHENSIVE METABOLIC PANEL
ALT: 14 U/L (ref 0–44)
AST: 22 U/L (ref 15–41)
Albumin: 4 g/dL (ref 3.5–5.0)
Alkaline Phosphatase: 70 U/L (ref 38–126)
Anion gap: 5 (ref 5–15)
BUN: 17 mg/dL (ref 8–23)
CO2: 27 mmol/L (ref 22–32)
Calcium: 9.1 mg/dL (ref 8.9–10.3)
Chloride: 103 mmol/L (ref 98–111)
Creatinine, Ser: 0.8 mg/dL (ref 0.44–1.00)
GFR, Estimated: >60 mL/min (ref >60)
Glucose, Bld: 113 mg/dL — ABNORMAL HIGH (ref 70–99)
Potassium: 4.1 mmol/L (ref 3.5–5.1)
Sodium: 135 mmol/L (ref 135–145)
Total Bilirubin: 0.9 mg/dL (ref 0.3–1.2)
Total Protein: 7.1 g/dL (ref 6.5–8.1)

## 2022-11-01 LAB — CBC WITH DIFFERENTIAL/PLATELET
Abs Immature Granulocytes: 0 10*3/uL (ref 0.00–0.07)
Basophils Absolute: 0 10*3/uL (ref 0.0–0.1)
Basophils Relative: 0 %
Eosinophils Absolute: 0.2 10*3/uL (ref 0.0–0.5)
Eosinophils Relative: 7 %
HCT: 28 % — ABNORMAL LOW (ref 36.0–46.0)
Hemoglobin: 8.8 g/dL — ABNORMAL LOW (ref 12.0–15.0)
Lymphocytes Relative: 22 %
Lymphs Abs: 0.5 10*3/uL — ABNORMAL LOW (ref 0.7–4.0)
MCH: 30.8 pg (ref 26.0–34.0)
MCHC: 31.4 g/dL (ref 30.0–36.0)
MCV: 97.9 fL (ref 80.0–100.0)
Metamyelocytes Relative: 2 %
Monocytes Absolute: 0.1 10*3/uL (ref 0.1–1.0)
Monocytes Relative: 5 %
Neutro Abs: 1.4 10*3/uL — ABNORMAL LOW (ref 1.7–7.7)
Neutrophils Relative %: 64 %
Platelets: 103 10*3/uL — ABNORMAL LOW (ref 150–400)
RBC: 2.86 MIL/uL — ABNORMAL LOW (ref 3.87–5.11)
RDW: 17.8 % — ABNORMAL HIGH (ref 11.5–15.5)
WBC: 2.2 10*3/uL — ABNORMAL LOW (ref 4.0–10.5)
nRBC: 0 % (ref 0.0–0.2)
nRBC: 1 /100 WBC — ABNORMAL HIGH

## 2022-11-01 LAB — MAGNESIUM: Magnesium: 2 mg/dL (ref 1.7–2.4)

## 2022-11-01 MED ORDER — PALONOSETRON HCL INJECTION 0.25 MG/5ML
0.2500 mg | Freq: Once | INTRAVENOUS | Status: AC
  Administered 2022-11-01: 0.25 mg via INTRAVENOUS
  Filled 2022-11-01: qty 5

## 2022-11-01 MED ORDER — SODIUM CHLORIDE 0.9 % IV SOLN
75.0000 mg/m2 | Freq: Once | INTRAVENOUS | Status: AC
  Administered 2022-11-01: 126 mg via INTRAVENOUS
  Filled 2022-11-01: qty 12.6

## 2022-11-01 MED ORDER — SODIUM CHLORIDE 0.9% FLUSH
10.0000 mL | Freq: Once | INTRAVENOUS | Status: AC
  Administered 2022-11-01: 10 mL via INTRAVENOUS

## 2022-11-01 MED ORDER — HEPARIN SOD (PORK) LOCK FLUSH 100 UNIT/ML IV SOLN
500.0000 [IU] | Freq: Once | INTRAVENOUS | Status: AC
  Administered 2022-11-01: 500 [IU] via INTRAVENOUS

## 2022-11-01 MED ORDER — SODIUM CHLORIDE 0.9 % IV SOLN: Freq: Once | INTRAVENOUS | Status: AC

## 2022-11-01 NOTE — Progress Notes (Signed)
Patient presents today for D6 Vidaza infusion. Patient is in satisfactory condition with complaints of worsening cough voiced.  Vital signs are stable.  Labs reviewed and other labs are within treatment parameters. Pt's ANC is 1.4 today. Dr.K made aware and stated pt is okay the proceed with treatment today. We will proceed with treatment per MD orders.    Patient stated her  cough is getting worse, she is not sleeping and anywhere she goes she is coughing her continuously and the cough is interrupting anything she is doing. Pt stated Dr. Ouida Sills  prescribed her hydrocodone liquid cough syrup, but he will not give any more cause it was a narcotic. Dr.K made aware and stated for pt to follow-up back up with Dr.Fagan and ask him to refer her to a pulmonologist. Patient made aware and verbalized understanding.  D6 Vidaza given today per MD orders. Tolerated infusion without adverse affects. Vital signs stable. No complaints at this time. Discharged from clinic ambulatory in stable condition. Alert and oriented x 3. F/U with [facility] as scheduled.

## 2022-11-01 NOTE — Patient Instructions (Signed)
[facility]  Discharge Instructions: Thank you for choosing [facility] to provide your oncology and hematology care.  If you have a lab appointment with the Cancer Center - please note that after April 8th, 2024, all labs will be drawn in the cancer center.  You do not have to check in or register with the main entrance as you have in the past but will complete your check-in in the cancer center.  Wear comfortable clothing and clothing appropriate for easy access to any Portacath or PICC line.   We strive to give you quality time with your provider. You may need to reschedule your appointment if you arrive late (15 or more minutes).  Arriving late affects you and other patients whose appointments are after yours.  Also, if you miss three or more appointments without notifying the office, you may be dismissed from the clinic at the provider's discretion.      For prescription refill requests, have your pharmacy contact our office and allow 72 hours for refills to be completed.    Today you received the following chemotherapy and/or immunotherapy agents D6 Vidaza   To help prevent nausea and vomiting after your treatment, we encourage you to take your nausea medication as directed.  Azacitidine Injection What is this medication? AZACITIDINE (ay za SITE i deen) treats blood and bone marrow cancers. It works by slowing down the growth of cancer cells. This medicine may be used for other purposes; ask your health care provider or pharmacist if you have questions. COMMON BRAND NAME(S): Vidaza What should I tell my care team before I take this medication? They need to know if you have any of these conditions: Kidney disease Liver disease Low blood cell levels, such as low white cells, platelets, or red blood cells Low levels of albumin in the blood Low levels of bicarbonate in the blood An unusual or allergic reaction to azacitidine, mannitol, other  medications, foods, dyes, or preservatives If you or your partner are pregnant or trying to get pregnant Breast-feeding How should I use this medication? This medication is injected into a vein or under the skin. It is given by your care team in a hospital or clinic setting. Talk to your care team about the use of this medication in children. While it may be prescribed for children as young as 1 month for selected conditions, precautions do apply. Overdosage: If you think you have taken too much of this medicine contact a poison control center or emergency room at once. NOTE: This medicine is only for you. Do not share this medicine with others. What if I miss a dose? Keep appointments for follow-up doses. It is important not to miss your dose. Call your care team if you are unable to keep an appointment. What may interact with this medication? Interactions are not expected. This list may not describe all possible interactions. Give your health care provider a list of all the medicines, herbs, non-prescription drugs, or dietary supplements you use. Also tell them if you smoke, drink alcohol, or use illegal drugs. Some items may interact with your medicine. What should I watch for while using this medication? Your condition will be monitored carefully while you are receiving this medication. You may need blood work while taking this medication. This medication may make you feel generally unwell. This is not uncommon as chemotherapy can affect healthy cells as well as cancer cells. Report any side effects. Continue your course of treatment  even though you feel ill unless your care team tells you to stop. Other product types may be available that contain the medication azacitidine. The injection and oral products should not be used in place of one another. Talk to your care team if you have questions. This medication can cause serious side effects. To reduce the risk, your care team may give you other  medications to take before receiving this one. Be sure to follow the directions from your care team. This medication may increase your risk of getting an infection. Call your care team for advice if you get a fever, chills, sore throat, or other symptoms of a cold or flu. Do not treat yourself. Try to avoid being around people who are sick. Avoid taking medications that contain aspirin, acetaminophen, ibuprofen, naproxen, or ketoprofen unless instructed by your care team. These medications may hide a fever. This medication may increase your risk to bruise or bleed. Call your care team if you notice any unusual bleeding. Be careful brushing or flossing your teeth or using a toothpick because you may get an infection or bleed more easily. If you have any dental work done, tell your dentist you are receiving this medication. Talk to your care team if you or your partner may be pregnant. Serious birth defects can occur if you take this medication during pregnancy and for 6 months after the last dose. You will need a negative pregnancy test before starting this medication. Contraception is recommended while taking his medication and for 6 months after the last dose. Your care team can help you find the option that works for you. If your partner can get pregnant, use a condom during sex while taking this medication and for 3 months after the last dose. Do not breastfeed while taking this medication and for 1 week after the last dose. This medication may cause infertility. Talk to your care team if you are concerned about your fertility. What side effects may I notice from receiving this medication? Side effects that you should report to your care team as soon as possible: Allergic reactions--skin rash, itching, hives, swelling of the face, lips, tongue, or throat Infection--fever, chills, cough, sore throat, wounds that don't heal, pain or trouble when passing urine, general feeling of discomfort or being  unwell Kidney injury--decrease in the amount of urine, swelling of the ankles, hands, or feet Liver injury--right upper belly pain, loss of appetite, nausea, light-colored stool, dark yellow or brown urine, yellowing skin or eyes, unusual weakness or fatigue Low red blood cell level--unusual weakness or fatigue, dizziness, headache, trouble breathing Tumor lysis syndrome (TLS)--nausea, vomiting, diarrhea, decrease in the amount of urine, dark urine, unusual weakness or fatigue, confusion, muscle pain or cramps, fast or irregular heartbeat, joint pain Unusual bruising or bleeding Side effects that usually do not require medical attention (report to your care team if they continue or are bothersome): Constipation Diarrhea Nausea Pain, redness, or irritation at injection site Vomiting This list may not describe all possible side effects. Call your doctor for medical advice about side effects. You may report side effects to FDA at 1-800-FDA-1088. Where should I keep my medication? This medication is given in a hospital or clinic. It will not be stored at home. NOTE: This sheet is a summary. It may not cover all possible information. If you have questions about this medicine, talk to your doctor, pharmacist, or health care provider.  2023 Elsevier/Gold Standard (2021-11-19 00:00:00)   BELOW ARE SYMPTOMS THAT SHOULD BE  REPORTED IMMEDIATELY: *FEVER GREATER THAN 100.4 F (38 C) OR HIGHER *CHILLS OR SWEATING *NAUSEA AND VOMITING THAT IS NOT CONTROLLED WITH YOUR NAUSEA MEDICATION *UNUSUAL SHORTNESS OF BREATH *UNUSUAL BRUISING OR BLEEDING *URINARY PROBLEMS (pain or burning when urinating, or frequent urination) *BOWEL PROBLEMS (unusual diarrhea, constipation, pain near the anus) TENDERNESS IN MOUTH AND THROAT WITH OR WITHOUT PRESENCE OF ULCERS (sore throat, sores in mouth, or a toothache) UNUSUAL RASH, SWELLING OR PAIN  UNUSUAL VAGINAL DISCHARGE OR ITCHING   Items with * indicate a potential  emergency and should be followed up as soon as possible or go to the Emergency Department if any problems should occur.  Please show the CHEMOTHERAPY ALERT CARD or IMMUNOTHERAPY ALERT CARD at check-in to the Emergency Department and triage nurse.  Should you have questions after your visit or need to cancel or reschedule your appointment, please contact [facility] [phone]  and follow the prompts.  Office hours are 8:00 a.m. to 4:30 p.m. Monday - Friday. Please note that voicemails left after 4:00 p.m. may not be returned until the following business day.  We are closed weekends and major holidays. You have access to a nurse at all times for urgent questions. Please call the main number to the clinic [phone] and follow the prompts.  For any non-urgent questions, you may also contact your provider using MyChart. We now offer e-Visits for anyone 66 and older to request care online for non-urgent symptoms. For details visit [email or web address].   Also download the MyChart app! Go to the app store, search "MyChart", open the app, select [place], and log in with your MyChart username and password.

## 2022-11-02 ENCOUNTER — Inpatient Hospital Stay: Payer: Medicare HMO

## 2022-11-02 VITALS — BP 104/65 | HR 75 | Temp 98.1°F | Resp 18

## 2022-11-02 DIAGNOSIS — I509 Heart failure, unspecified: Secondary | ICD-10-CM | POA: Diagnosis not present

## 2022-11-02 DIAGNOSIS — Z79899 Other long term (current) drug therapy: Secondary | ICD-10-CM | POA: Diagnosis not present

## 2022-11-02 DIAGNOSIS — K5900 Constipation, unspecified: Secondary | ICD-10-CM | POA: Diagnosis not present

## 2022-11-02 DIAGNOSIS — R634 Abnormal weight loss: Secondary | ICD-10-CM | POA: Diagnosis not present

## 2022-11-02 DIAGNOSIS — E538 Deficiency of other specified B group vitamins: Secondary | ICD-10-CM | POA: Diagnosis not present

## 2022-11-02 DIAGNOSIS — D539 Nutritional anemia, unspecified: Secondary | ICD-10-CM | POA: Diagnosis not present

## 2022-11-02 DIAGNOSIS — C9310 Chronic myelomonocytic leukemia not having achieved remission: Secondary | ICD-10-CM

## 2022-11-02 DIAGNOSIS — Z95828 Presence of other vascular implants and grafts: Secondary | ICD-10-CM

## 2022-11-02 DIAGNOSIS — Z5111 Encounter for antineoplastic chemotherapy: Secondary | ICD-10-CM | POA: Diagnosis not present

## 2022-11-02 DIAGNOSIS — D696 Thrombocytopenia, unspecified: Secondary | ICD-10-CM | POA: Diagnosis not present

## 2022-11-02 MED ORDER — HEPARIN SOD (PORK) LOCK FLUSH 100 UNIT/ML IV SOLN
500.0000 [IU] | Freq: Once | INTRAVENOUS | Status: AC
  Administered 2022-11-02: 500 [IU] via INTRAVENOUS

## 2022-11-02 MED ORDER — SODIUM CHLORIDE 0.9 % IV SOLN: Freq: Once | INTRAVENOUS | Status: AC

## 2022-11-02 MED ORDER — SODIUM CHLORIDE 0.9 % IV SOLN
75.0000 mg/m2 | Freq: Once | INTRAVENOUS | Status: AC
  Administered 2022-11-02: 126 mg via INTRAVENOUS
  Filled 2022-11-02: qty 12.6

## 2022-11-02 MED ORDER — SODIUM CHLORIDE 0.9% FLUSH
10.0000 mL | Freq: Once | INTRAVENOUS | Status: AC
  Administered 2022-11-02: 10 mL via INTRAVENOUS

## 2022-11-02 NOTE — Progress Notes (Signed)
Patient presents today for chemotherapy infusion.  Patient is in satisfactory condition with no new complaints voiced.  Vital signs are stable.  Labs reviewed and all labs are within treatment parameters.  We will proceed with treatment per MD orders.  

## 2022-11-05 ENCOUNTER — Encounter: Payer: Self-pay | Admitting: Allergy

## 2022-11-05 ENCOUNTER — Ambulatory Visit: Payer: Medicare HMO | Admitting: Allergy

## 2022-11-05 VITALS — BP 92/60 | HR 68 | Resp 18 | Wt 139.0 lb

## 2022-11-05 DIAGNOSIS — K219 Gastro-esophageal reflux disease without esophagitis: Secondary | ICD-10-CM | POA: Diagnosis not present

## 2022-11-05 DIAGNOSIS — J3089 Other allergic rhinitis: Secondary | ICD-10-CM

## 2022-11-05 DIAGNOSIS — J4540 Moderate persistent asthma, uncomplicated: Secondary | ICD-10-CM | POA: Diagnosis not present

## 2022-11-05 DIAGNOSIS — J300 Vasomotor rhinitis: Secondary | ICD-10-CM

## 2022-11-05 DIAGNOSIS — R053 Chronic cough: Secondary | ICD-10-CM

## 2022-11-05 MED ORDER — BREZTRI AEROSPHERE 160-9-4.8 MCG/ACT IN AERO: 2.0000 | INHALATION_SPRAY | Freq: Two times a day (BID) | RESPIRATORY_TRACT | 5 refills | Status: DC

## 2022-11-05 MED ORDER — FLUTICASONE PROPIONATE 50 MCG/ACT NA SUSP: 2.0000 | Freq: Two times a day (BID) | NASAL | 5 refills | Status: DC

## 2022-11-05 MED ORDER — FLUTICASONE PROPIONATE HFA 110 MCG/ACT IN AERO: INHALATION_SPRAY | RESPIRATORY_TRACT | 5 refills | Status: DC

## 2022-11-05 MED ORDER — MONTELUKAST SODIUM 10 MG PO TABS: 10.0000 mg | ORAL_TABLET | Freq: Every day | ORAL | 5 refills | Status: DC

## 2022-11-05 MED ORDER — FAMOTIDINE 20 MG PO TABS: 20.0000 mg | ORAL_TABLET | Freq: Two times a day (BID) | ORAL | 5 refills | Status: DC

## 2022-11-05 MED ORDER — IPRATROPIUM BROMIDE 0.06 % NA SOLN: NASAL | 5 refills | Status: DC

## 2022-11-05 MED ORDER — ALBUTEROL SULFATE HFA 108 (90 BASE) MCG/ACT IN AERS: 2.0000 | INHALATION_SPRAY | RESPIRATORY_TRACT | 1 refills | Status: DC | PRN

## 2022-11-05 MED ORDER — PANTOPRAZOLE SODIUM 40 MG PO TBEC: 40.0000 mg | DELAYED_RELEASE_TABLET | Freq: Every day | ORAL | 5 refills | Status: DC

## 2022-11-05 NOTE — Patient Instructions (Addendum)
Moderate persistent asthma Daily controller medication(s):  Breztri 2 puffs twice a day.  Rinse mouth after use.    For now step-up therapy with Fluticasone 2 puffs twice a day Tezspire monthly injections.   Will talk with Tammy regarding options with your injections Continue montelukast  daily.  Prior to physical activity: May use albuterol rescue inhaler 2 puffs 5 to 15 minutes prior to strenuous physical activities. Rescue medications: May use albuterol rescue inhaler 2 puffs or nebulizer every 4 to 6 hours as needed for shortness of breath, chest tightness, coughing, and wheezing. Monitor frequency of use.   Asthma control goals:  Full participation in all desired activities (may need albuterol before activity) Albuterol use two times or less a week on average (not counting use with activity) Cough interfering with sleep two times or less a month Oral steroids no more than once a year No hospitalizations   Perennial allergic rhinitis with a vasomotor rhinitis Continue environmental control measures. Continue montelukast  daily. Continue fluticasone nasal spray 1-2 spray per nostril once a day for nasal congestion. Continue nasal Atrovent 2 sprays each nostril up to 3-4 times a day as needed for runny nose/nasal drainage.   If nose gets too dry stop use and can use nasal saline spray Nasal saline spray (i.e., Simply Saline) or nasal saline lavage (i.e., NeilMed) is recommended as needed and prior to medicated nasal sprays.  GERD (gastroesophageal reflux disease) Continue appropriate reflux lifestyle modifications.  Omeprazole has not been effective  Continue Pantoprazole 1 tab daily.  Continue Famotidine 20 mg twice daily. Cut out caffeine.  Change to decaf coffee   Cough Improved with above asthma regimen Follow up with Dr. Suszanne Conners (ENT). Continue Gabapentin  2 times a day as prescribed by Dr. Suszanne Conners.   Follow up in 3-6 months or sooner if needed.

## 2022-11-05 NOTE — Progress Notes (Signed)
Follow-up Note  RE: [name] MRN: [id number] DOB: 1950/09/17 Date of Office Visit: 11/05/2022   History of present illness: [name] is a 72 y.o. female presenting today for follow-up of asthma, allergic rhinitis with vasomotor rhinitis, GERD.  She was last seen in the office on 09/03/2021 by myself. She was diagnosed in the past year since her last visit with me with chronic myelomonocytic luekemia and she is currently underoing chemotherapy.  She has been doing well with this for the most part.  She is in good spirits. She however has been having more coughing spells kind similar to how she coughed when I first met her.   The albuterol does seem the help with the use that she has had an increase in use of this.  It is not every day that she has a coughing spell.  But it can occur in different environments.  She is not sure what is triggering the cough.  She is currently taking Breztri 2 puffs twice a day and Singulair daily.  She is on monthly tezspire.  She is wondering if there is another option besides the tezspire where she would not have to call monthly to okay shipment.  She states with everything going on and "chemo brain" and has become difficult to remember every month to call the specialty pharmacy to share about her injection.  She does get her injection at our [place] location.  Test prior thus far been quite effective for her cough.  She also has been following with ENT Dr. Suszanne Conners for cough as well and has been on gabapentin.  She also controls her reflux with pantoprazole and famotidine.  In regards to her allergy symptom control she takes Singulair as above as well as will use Flonase and nasal Atrovent.  She did have a chest CT in December 2023 that was largely unremarkable and only showing scarring versus atelectasis in the lower lung bases.  Review of systems: Review of Systems  Constitutional: Negative.   HENT: Negative.    Eyes: Negative.   Respiratory:  Positive  for cough.   Cardiovascular: Negative.   Gastrointestinal: Negative.   Musculoskeletal: Negative.   Skin: Negative.   Allergic/Immunologic: Negative.   Neurological: Negative.     Past medical/social/surgical/family history have been reviewed and are unchanged unless specifically indicated below.  No changes  Medication List: Current Outpatient Medications  Medication Sig Dispense Refill   albuterol (VENTOLIN HFA) 108 (90 Base) MCG/ACT inhaler INHALE 2 PUFFS BY MOUTH EVERY 6 HOURS AS NEEDED FOR SHORTNESS OF BREATH OR WHEEZING. 18 g 2   albuterol (VENTOLIN HFA) 108 (90 Base) MCG/ACT inhaler Inhale 2 puffs into the lungs every 4 (four) hours as needed for wheezing or shortness of breath. 18 g 1   azaCITIDine 5 mg/2 mLs in lactated ringers infusion Inject into the vein daily. Days 1-7 every 28 days     azelastine (ASTELIN) 0.1 % nasal spray SPRAY 1 TO 2 SPRAYS PER NOSTRIL TWICE DAILY. 30 mL 3   benzonatate (TESSALON) 200 MG capsule Take 200 mg by mouth 3 (three) times daily as needed.     cetirizine (ZYRTEC) 5 MG tablet Take 1 tablet (5 mg total) by mouth daily. 30 tablet 0   EPINEPHrine 0.3 mg/0.3 mL IJ SOAJ injection Inject 0.3 mg into the muscle as needed for anaphylaxis. 2 each 1   FLUoxetine (PROZAC) 20 MG capsule Take 1 capsule by mouth daily.     fluticasone (FLOVENT HFA)  110 MCG/ACT inhaler Inhale two puffs twice daily to prevent cough or wheeze. Rinse mouth after use. 12 g 5   folic acid (FOLVITE) 1 MG tablet TAKE ONE TABLET BY MOUTH ONCE DAILY 30 tablet 5   gabapentin (NEURONTIN) 300 MG capsule      lactulose (CHRONULAC) 10 GM/15ML solution take 30ml BY MOUTH EVERY THREE HOURS UNTIL bowel movement; THEN take 30ml ONCE DAILY AS NEEDED FOR CONSTIPATION 450 mL 2   lidocaine-prilocaine (EMLA) cream Apply a quarter sized amount to port a cath site and cover with plastic wrap one hour prior to infusion appointments 30 g 3   megestrol (MEGACE) 400 MG/10ML suspension Take 10 mLs (400 mg  total) by mouth 2 (two) times daily. 480 mL 2   NON FORMULARY [facility]  Antifungal (nail)-#1     NON FORMULARY [facility]  Anti-fungal (nail)-#1     prochlorperazine (COMPAZINE) 10 MG tablet Take 1 tablet (10 mg total) by mouth every 6 (six) hours as needed for nausea or vomiting. 30 tablet 0   Respiratory Therapy Supplies (FLUTTER) DEVI Use as directed 1 each 3   SF 5000 PLUS 1.1 % CREA dental cream      Spacer/Aero-Holding Chambers (AEROCHAMBER PLUS WITH MASK) inhaler 1 each by Other route See admin instructions. Use with inhaler as instructed. 1 each 1   traZODone (DESYREL) 50 MG tablet TAKE ONE TABLET BY MOUTH AT BEDTIME 30 tablet 1   BREZTRI AEROSPHERE 160-9-4.8 MCG/ACT AERO Inhale 2 puffs into the lungs 2 (two) times daily. 10.7 g 5   famotidine (PEPCID) 20 MG tablet Take 1 tablet (20 mg total) by mouth 2 (two) times daily. 60 tablet 5   fluticasone (FLONASE) 50 MCG/ACT nasal spray Place 2 sprays into both nostrils 2 (two) times daily. 16 g 5   ipratropium (ATROVENT) 0.06 % nasal spray USE 2 SPRAYS IN EACH NOSTRIL THREE TIMES A DAY AS NEEDED. 15 mL 5   montelukast (SINGULAIR) 10 MG tablet Take 1 tablet (10 mg total) by mouth at bedtime. 30 tablet 5   pantoprazole (PROTONIX) 40 MG tablet Take 1 tablet (40 mg total) by mouth daily. 30 tablet 5   Current Facility-Administered Medications  Medication Dose Route Frequency Provider Last Rate Last Admin   tezepelumab-ekko (TEZSPIRE) 210 MG/1. syringe 210 mg  210 mg Subcutaneous Q28 days Marcelyn Bruins, MD   210 mg at 10/18/22 1610     Known medication allergies: Allergies  Allergen Reactions   Levonorgestrel-Ethinyl Estrad Cough   Sulfa Antibiotics Other (See Comments)    Unknown- pt unsure of reaction; believes it may be nausea   Sulfamethoxazole-Trimethoprim Other (See Comments)   Cephalosporins Other (See Comments)    Other Reaction: Toxicity     Physical examination: Blood pressure 92/60,  pulse 68, resp. rate 18, weight 139 lb (63 kg), SpO2 98 %.  General: Alert, interactive, in no acute distress. HEENT: PERRLA, TMs pearly gray, turbinates non-edematous without discharge, post-pharynx non erythematous. Neck: Supple without lymphadenopathy. Lungs: Clear to auscultation without wheezing, rhonchi or rales. {no increased work of breathing. CV: Normal S1, S2 without murmurs. Abdomen: Nondistended, nontender. Skin: Warm and dry, without lesions or rashes. Extremities:  No clubbing, cyanosis or edema. Neuro:   Grossly intact.  Diagnositics/Labs: None today  Assessment and plan:   Moderate persistent asthma Daily controller medication(s):  Breztri 2 puffs twice a day.  Rinse mouth after use.    For now step-up therapy with Fluticasone 2 puffs twice a  day Tezspire monthly injections.   Will talk with Tammy regarding options with your injections Continue montelukast  daily.  Prior to physical activity: May use albuterol rescue inhaler 2 puffs 5 to 15 minutes prior to strenuous physical activities. Rescue medications: May use albuterol rescue inhaler 2 puffs or nebulizer every 4 to 6 hours as needed for shortness of breath, chest tightness, coughing, and wheezing. Monitor frequency of use.   Asthma control goals:  Full participation in all desired activities (may need albuterol before activity) Albuterol use two times or less a week on average (not counting use with activity) Cough interfering with sleep two times or less a month Oral steroids no more than once a year No hospitalizations   Perennial allergic rhinitis with a vasomotor rhinitis Continue environmental control measures. Continue montelukast  daily. Continue fluticasone nasal spray 1-2 spray per nostril once a day for nasal congestion. Continue nasal Atrovent 2 sprays each nostril up to 3-4 times a day as needed for runny nose/nasal drainage.   If nose gets too dry stop use and can use nasal  saline spray Nasal saline spray (i.e., Simply Saline) or nasal saline lavage (i.e., NeilMed) is recommended as needed and prior to medicated nasal sprays.  GERD (gastroesophageal reflux disease) Continue appropriate reflux lifestyle modifications.  Omeprazole has not been effective  Continue Pantoprazole 1 tab daily.  Continue Famotidine 20 mg twice daily. Cut out caffeine.  Change to decaf coffee   Cough Improved with above asthma regimen Follow up with Dr. Suszanne Conners (ENT). Continue Gabapentin  2 times a day as prescribed by Dr. Suszanne Conners.   Follow up in 3-6 months or sooner if needed.   I appreciate the opportunity to take part in [name]'s care. Please do not hesitate to contact me with questions.  Sincerely,   Margo Aye, MD Allergy/Immunology [facility]

## 2022-11-11 ENCOUNTER — Telehealth: Payer: Self-pay | Admitting: Allergy

## 2022-11-11 ENCOUNTER — Telehealth: Payer: Self-pay

## 2022-11-11 ENCOUNTER — Other Ambulatory Visit (HOSPITAL_COMMUNITY): Payer: Self-pay

## 2022-11-11 ENCOUNTER — Encounter: Payer: Self-pay | Admitting: Hematology

## 2022-11-11 NOTE — Telephone Encounter (Signed)
Patient Advocate Encounter   Received notification from [facility] that prior authorization is required for Flovent HFA 110MCG/ACT aerosol   Submitted: 11-11-2022 Key [id number]  Status is pending  This was submitted for generic as name brand is no longer on the market

## 2022-11-11 NOTE — Telephone Encounter (Signed)
Patient requests a call back, she states someone called her and she is at work and could not answer. She asked if we could call her on her  Cell phone [phone].

## 2022-11-11 NOTE — Telephone Encounter (Signed)
Called however voicemail has not been set up.

## 2022-11-12 ENCOUNTER — Telehealth: Payer: Self-pay | Admitting: *Deleted

## 2022-11-12 NOTE — Telephone Encounter (Signed)
Thanks Tammy.       11/12/22  3:33 PM Voncannon, Tawni Millers, CMA routed this conversation to Marcelyn Bruins, MD Devoria Glassing, [facility]      11/12/22  3:33 PM Note I called patient and advised that I try to reach out monthly to remind her to order her medication. I am going to send a letter to help her with numbers to call           11/12/22  3:21 PM Voncannon, Tawni Millers, CMA contacted Anner Crete, Beronica J    Voncannon, Tammy M, [place]      11/12/22  3:21 PM Note ----- Message from Marcelyn Bruins, MD sent at 11/12/2022  8:24 AM EDT ----- Regarding: FW: [name] Forwarding message to Tammy ----- Message ----- From: Ralene Muskrat, CMA Sent: 11/11/2022   8:05 PM EDT To: Marcelyn Bruins, MD; # Subject: RE: Harriett Rush and spoke to patient to inform her of the note. Patient expressed that due to her chemo and her providers knowing what medications she is on she will stay with the Tezspire. Patient is concerned that switching biologics it will interfere with her chemo. Patient stated that she would like a reminder call from [place] the week prior to her appointment so that she can ok the shipment.    Patient is asking that Tammy give her a call tomorrow so that she can get the name of the pharmacy and the numbers to call for her to reorder her medications.

## 2022-11-12 NOTE — Telephone Encounter (Signed)
I called patient and advised that I try to reach out monthly to remind her to order her medication. I am going to send a letter to help her with numbers to call

## 2022-11-12 NOTE — Telephone Encounter (Signed)
-----   Message from [facility] Larose Hires, MD sent at 11/12/2022  8:24 AM EDT ----- Regarding: FW: [name] Forwarding message to Jermanie Minshall ----- Message ----- From: Ralene Muskrat, CMA Sent: 11/11/2022   8:05 PM EDT To: Marcelyn Bruins, MD; # Subject: RE: Harriett Rush and spoke to patient to inform her of the note. Patient expressed that due to her chemo and her providers knowing what medications she is on she will stay with the Tezspire. Patient is concerned that switching biologics it will interfere with her chemo. Patient stated that she would like a reminder call from [place] the week prior to her appointment so that she can ok the shipment.   Patient is asking that Delmy Holdren give her a call tomorrow so that she can get the name of the pharmacy and the numbers to call for her to reorder her medications.  ----- Message ----- From: Ralene Muskrat, CMA Sent: 11/10/2022   7:24 PM EDT To: Marcelyn Bruins, MD; [facility] Subject: RE: Harriett Rush and left a message for patient to call our office back to discuss her options.  ----- Message ----- From: Marcelyn Bruins, MD Sent: 11/10/2022   9:49 AM EDT To: [facility] Subject: tezspire                                       Please let pt know I talked with Upton Russey and we have some options for her and her injections.   1) Yovan Leeman can call you monthly to remind you to call the pharmacy to [facility] shipment of Tezpsire.   Unfortunately, the company for [name] does not allow another party to [facility] shipment.   If you are ok with having Ridwan Bondy call to remind you monthly we can do this optoin   2) We can change Tezpsire to another similar agent either Harrington Challenger or [place] where Dawsen Krieger is allowed with pt permission to OK shipment of medication on pts behalf.    Please see if [place] has a preference for how to proceed with her injections.

## 2022-11-15 ENCOUNTER — Ambulatory Visit (INDEPENDENT_AMBULATORY_CARE_PROVIDER_SITE_OTHER): Payer: Medicare HMO

## 2022-11-15 DIAGNOSIS — J4550 Severe persistent asthma, uncomplicated: Secondary | ICD-10-CM

## 2022-11-15 DIAGNOSIS — J4540 Moderate persistent asthma, uncomplicated: Secondary | ICD-10-CM

## 2022-11-15 NOTE — Telephone Encounter (Signed)
PA has been DENIED due to:   The drug you asked for is not listed in your preferred drug list (formulary). The preferred drug(s), you may not have tried, are: Arnuity Ellipta powder for inhalation Your provider needs to give us medical reasons why the preferred drug(s) would not work for you and/or would have bad side effects. Sometimes a preferred drug needs more review for approval. Additionally, some preferred drugs listed may be the same drugs with different strengths or forms. [employer] may only require one strength or form of that drug to be tried. 

## 2022-11-15 NOTE — Telephone Encounter (Signed)
Would you want to switch Saadiya to Arnuity?

## 2022-11-16 DIAGNOSIS — C9310 Chronic myelomonocytic leukemia not having achieved remission: Secondary | ICD-10-CM | POA: Diagnosis not present

## 2022-11-17 ENCOUNTER — Other Ambulatory Visit (HOSPITAL_COMMUNITY): Payer: Self-pay

## 2022-11-17 ENCOUNTER — Other Ambulatory Visit: Payer: Self-pay

## 2022-11-17 ENCOUNTER — Other Ambulatory Visit: Payer: Self-pay | Admitting: Hematology

## 2022-11-17 ENCOUNTER — Ambulatory Visit: Payer: Medicare HMO | Admitting: Allergy

## 2022-11-17 ENCOUNTER — Other Ambulatory Visit: Payer: Self-pay | Admitting: *Deleted

## 2022-11-17 DIAGNOSIS — C9310 Chronic myelomonocytic leukemia not having achieved remission: Secondary | ICD-10-CM

## 2022-11-17 MED ORDER — ARNUITY ELLIPTA 200 MCG/ACT IN AEPB: 1.0000 | INHALATION_SPRAY | Freq: Every day | RESPIRATORY_TRACT | 5 refills | Status: DC

## 2022-11-17 NOTE — Progress Notes (Signed)
I have talked to Dr. Lowell Guitar today, who called me to update on the bone marrow biopsy results from 11/16/2022 which showed decreased cellularity with the blast percentage being low with some fibrosis. His recommendation was to decrease azacitidine to 5 days every 4 weeks.  CBC with differential will be checked on day 15 of each cycle.

## 2022-11-17 NOTE — Telephone Encounter (Signed)
No metered dose inhalers covered.

## 2022-11-17 NOTE — Telephone Encounter (Signed)
New prescription has been sent in. Called and left a voicemail asking for return call to inform of change in medication.

## 2022-11-18 NOTE — Telephone Encounter (Signed)
Called and spoke with patient and advised of change in inhaler and advised of direction of use. Patient verbalized understanding.

## 2022-11-19 ENCOUNTER — Other Ambulatory Visit: Payer: Self-pay

## 2022-11-21 NOTE — Progress Notes (Signed)
[facility] [address], [place] [address]    Clinic Day:  11/22/2022  Referring physician: Carylon Perches, MD  Patient Care Team: Carylon Perches, MD as PCP - General (Internal Medicine) Jena Gauss Gerrit Friends, MD as Attending Physician (Gastroenterology) Storm Frisk, MD as Consulting Physician (Pulmonary Disease) Doreatha Massed, MD as Medical Oncologist (Hematology)   ASSESSMENT & PLAN:   Assessment: 1.  Macrocytic anemia and thrombocytopenia: - Patient seen at the request of Dr. Ouida Sills for abnormal CBC. - 04/17/2022: WBC 15.8, Hb-9.7, PLT-128 - 03/24/2022: WBC-8.1 (N-43%, L-17%, M-19%, B-2%), Hb-9.6, MCV-99, PLT-115 - Smear review: 13% metamyelocytes, 2% myelocytes, 2% blasts,  - 10/19/2021: WBC-5.3 (N-61, L-21, M-10%), Hb-11, MCV-96, PLT-97 - BMBX (05/25/2022): Hypercellular bone marrow with myeloid hyperplasia with dysgranulopoiesis, erythroid hypoplasia and megakaryocytic hyperplasia with dyspoiesis.  Chromosome analysis was 42, XX.  Bone marrow blasts less than 5%.  Peripheral blood blasts less than 2%. - Mayo molecular model risk stratification: Intermediate 2 risk with at least 2 points (decreased hemoglobin less than 10, circulating immature cells).  Intermediate 2 risk with median OS 31 months. - Serum EPO level 41. - NGS: Positive for CBL, MPL, SRSF2, TET 2, RAD21 - PET scan (07/08/2022): Splenomegaly (volume 510 mm) with normal metabolic activity.  Uniform increase in marrow metabolic activity related to patient's anemia.  No lymphadenopathy. - BMBX (07/16/2022): Hypercellular bone marrow (95-100%) with myeloid hyperplasia, atypical monocytes, increased blasts (16% overall).  Atypical monocytosis present 23% of total events, expressing HLA-DR, CD38, CD4 dim, CD11C, CD13, CD14, CD36, CD64 with aberrant coexpression of CD56 and CD7. - Cycle 1 of azacitidine on 08/02/2022, cycle 2 on 08/30/2022.  Azacitidine decreased to 5 days starting cycle 5 on 11/22/2022 - BMBX  (09/21/2022): 3% blasts, hypercellular marrow with markedly increased dyspoietic megakaryocytes, increased fibrosis - BMBX (11/16/2022): Hypercellular marrow (70-80%) with marked GIST [name] (, myelofibrosis and 3% blasts on a very limited sample.   2.  Social/family history: - She lives at home by herself.  Son lives next door. - Does part-time work at [employer].  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  Non-smoker. - Maternal aunt had tumor in the breast, patient not certain if it is cancer.  2 maternal first cousins had lymphoma.  Mother had stomach cancer.    Plan: 1.  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 - She is continuing to work part-time on Tuesday and Thursday. - Reviewed labs today: White count is 3.0 with ANC 2.0.  Platelet count is normal.  Hemoglobin is improved to 10.1.  LDH elevated at 710.  LFTs are normal. - I have talked to Dr. Lowell Guitar who recommended decreasing azacitidine to 5 days every 28 days. - We discussed bone marrow biopsy results from [facility].  I agree with changing azacitidine to 5 days every 28 days. - We will obtain day 15 CBC with differential. - RTC 4 weeks for follow-up.   2.  Folate deficiency: - Continue folic acid 1 mg tablet daily.   3.  Weight loss: - She has been off of Megace.  Appetite is good.   4.  Constipation: - Continue stool softener and lactulose.    No orders of the defined types were placed in this encounter.     I,[name],acting as a Neurosurgeon for Doreatha Massed, MD.,have documented all relevant documentation on the behalf of Doreatha Massed, MD,as directed by  Doreatha Massed,  MD while in the presence of Doreatha Massed, MD.   I, Doreatha Massed MD, have reviewed the above documentation for accuracy and completeness, and I agree with the above.   Doreatha Massed, MD   5/6/202412:53  PM  CHIEF COMPLAINT:   Diagnosis: CMML-2    Cancer Staging  No matching staging information was found for the patient.   Prior Therapy: none  Current Therapy:  Azacitidine 70 mg/m x 7 days every 28 days    HISTORY OF PRESENT ILLNESS:   Oncology History  CMML (chronic myelomonocytic leukemia) (HCC)  06/03/2022 Initial Diagnosis   CMML (chronic myelomonocytic leukemia) (HCC)   08/02/2022 -  Chemotherapy   Patient is on Treatment Plan : MYELODYSPLASIA  Azacitidine IVPB D1-7 q28d        INTERVAL HISTORY:   [name] is a 72 y.o. female presenting to clinic today for follow up of CMML-2. She was last seen by me on 10/25/22.  Since her last visit, she underwent bone marrow biopsy on 11/06/22 at [facility]. Results showed decreased cellularity with the blast percentage being low with some fibrosis.  Today, she states that she is doing well overall. Her appetite level is at 70%. Her energy level is at 75%.  PAST MEDICAL HISTORY:   Past Medical History: Past Medical History:  Diagnosis Date   Anxiety    Asthma    had 1 episode 1 year ago-no more problem   CHF (congestive heart failure) (HCC)    swelling of feet & ankles   CMML (chronic myelomonocytic leukemia) (HCC) 06/03/2022   Cough    Depression    OCD   GERD (gastroesophageal reflux disease)    severe   OCD (obsessive compulsive disorder)    Port-A-Cath in place 07/26/2022   Shortness of breath    with exertion   Yeast infection 04/09/2014    Surgical History: Past Surgical History:  Procedure Laterality Date   CARPAL TUNNEL RELEASE  03/22/2012   Procedure: CARPAL TUNNEL RELEASE;  Surgeon: Nicki Reaper, MD;  Location: [facility];  Service: Orthopedics;  Laterality: Right;  right carpal tunnel release   COLONOSCOPY  06/19/2012   Dr. Rourk:normal rectum and colon    ESOPHAGOGASTRODUODENOSCOPY N/A 12/26/2015   Dr. Jena Gauss: normal    IR IMAGING GUIDED PORT INSERTION  07/30/2022   TONSILLECTOMY     TUBAL  LIGATION      Social History: Social History   Socioeconomic History   Marital status: Widowed    Spouse name: Not on file   Number of children: 1   Years of education: Not on file   Highest education level: Not on file  Occupational History   Occupation: retired  Tobacco Use   Smoking status: Never    Passive exposure: Yes   Smokeless tobacco: Never  Vaping Use   Vaping Use: Never used  Substance and Sexual Activity   Alcohol use: No   Drug use: No   Sexual activity: Yes    Birth control/protection: Surgical    Comment: tubal  Other Topics Concern   Not on file  Social History Narrative   Not on file   Social Determinants of Health   Financial Resource Strain: Not on file  Food Insecurity: Not on file  Transportation Needs: Not on file  Physical Activity: Not on file  Stress: Not on file  Social Connections: Not on file  Intimate Partner Violence: Not on file    Family History: Family History  Problem Relation  Age of Onset   Asthma Mother    Macular degeneration Mother    Hypertension Mother    Stomach cancer Mother 109   Alzheimer's disease Father    Other Sister        blood clots   Diabetes Maternal Grandfather    Diabetes Paternal Grandfather    Other Son        enlarged spleen   Lymphoma Cousin        x2, both dx in their 54s   Colon cancer Neg Hx     Current Medications:  Current Outpatient Medications:    albuterol (VENTOLIN HFA) 108 (90 Base) MCG/ACT inhaler, INHALE 2 PUFFS BY MOUTH EVERY 6 HOURS AS NEEDED FOR SHORTNESS OF BREATH OR WHEEZING., Disp: 18 g, Rfl: 2   albuterol (VENTOLIN HFA) 108 (90 Base) MCG/ACT inhaler, Inhale 2 puffs into the lungs every 4 (four) hours as needed for wheezing or shortness of breath., Disp: 18 g, Rfl: 1   ARNUITY ELLIPTA 200 MCG/ACT AEPB, Inhale 1 puff into the lungs daily., Disp: 30 each, Rfl: 5   azaCITIDine 5 mg/2 mLs in lactated ringers infusion, Inject into the vein daily. Days 1-7 every 28 days, Disp: ,  Rfl:    azelastine (ASTELIN) 0.1 % nasal spray, SPRAY 1 TO 2 SPRAYS PER NOSTRIL TWICE DAILY., Disp: 30 mL, Rfl: 3   benzonatate (TESSALON) 200 MG capsule, Take 200 mg by mouth 3 (three) times daily as needed., Disp: , Rfl:    BREZTRI AEROSPHERE 160-9-4.8 MCG/ACT AERO, Inhale 2 puffs into the lungs 2 (two) times daily., Disp: 10.7 g, Rfl: 5   cetirizine (ZYRTEC) 5 MG tablet, Take 1 tablet (5 mg total) by mouth daily., Disp: 30 tablet, Rfl: 0   EPINEPHrine 0.3 mg/0.3 mL IJ SOAJ injection, Inject 0.3 mg into the muscle as needed for anaphylaxis., Disp: 2 each, Rfl: 1   famotidine (PEPCID) 20 MG tablet, Take 1 tablet (20 mg total) by mouth 2 (two) times daily., Disp: 60 tablet, Rfl: 5   FLUoxetine (PROZAC) 20 MG capsule, Take 1 capsule by mouth daily., Disp: , Rfl:    fluticasone (FLONASE) 50 MCG/ACT nasal spray, Place 2 sprays into both nostrils 2 (two) times daily., Disp: 16 g, Rfl: 5   fluticasone (FLOVENT HFA) 110 MCG/ACT inhaler, Inhale two puffs twice daily to prevent cough or wheeze. Rinse mouth after use., Disp: 12 g, Rfl: 5   folic acid (FOLVITE) 1 MG tablet, TAKE ONE TABLET BY MOUTH ONCE DAILY, Disp: 30 tablet, Rfl: 5   gabapentin (NEURONTIN) 300 MG capsule, , Disp: , Rfl:    ipratropium (ATROVENT) 0.06 % nasal spray, USE 2 SPRAYS IN EACH NOSTRIL THREE TIMES A DAY AS NEEDED., Disp: 15 mL, Rfl: 5   lactulose (CHRONULAC) 10 GM/15ML solution, take 30ml BY MOUTH EVERY THREE HOURS UNTIL bowel movement; THEN take 30ml ONCE DAILY AS NEEDED FOR CONSTIPATION, Disp: 450 mL, Rfl: 2   lidocaine-prilocaine (EMLA) cream, Apply a quarter sized amount to port a cath site and cover with plastic wrap one hour prior to infusion appointments, Disp: 30 g, Rfl: 3   megestrol (MEGACE) 400 MG/10ML suspension, Take 10 mLs (400 mg total) by mouth 2 (two) times daily., Disp: 480 mL, Rfl: 2   montelukast (SINGULAIR) 10 MG tablet, Take 1 tablet (10 mg total) by mouth at bedtime., Disp: 30 tablet, Rfl: 5   NON FORMULARY,  [facility]  Antifungal (nail)-#1, Disp: , Rfl:    NON FORMULARY, [facility]  Anti-fungal (  nail)-#1, Disp: , Rfl:    pantoprazole (PROTONIX) 40 MG tablet, Take 1 tablet (40 mg total) by mouth daily., Disp: 30 tablet, Rfl: 5   prochlorperazine (COMPAZINE) 10 MG tablet, Take 1 tablet (10 mg total) by mouth every 6 (six) hours as needed for nausea or vomiting., Disp: 30 tablet, Rfl: 0   Respiratory Therapy Supplies (FLUTTER) DEVI, Use as directed, Disp: 1 each, Rfl: 3   SF 5000 PLUS 1.1 % CREA dental cream, , Disp: , Rfl:    Spacer/Aero-Holding Chambers (AEROCHAMBER PLUS WITH MASK) inhaler, 1 each by Other route See admin instructions. Use with inhaler as instructed., Disp: 1 each, Rfl: 1   traZODone (DESYREL) 50 MG tablet, TAKE ONE TABLET BY MOUTH AT BEDTIME, Disp: 30 tablet, Rfl: 1  Current Facility-Administered Medications:    tezepelumab-ekko (TEZSPIRE) 210 MG/1. syringe 210 mg, 210 mg, Subcutaneous, Q28 days, Marcelyn Bruins, MD, 210 mg at 11/15/22 0941   Allergies: Allergies  Allergen Reactions   Levonorgestrel-Ethinyl Estrad Cough   Sulfa Antibiotics Other (See Comments)    Unknown- pt unsure of reaction; believes it may be nausea   Sulfamethoxazole-Trimethoprim Other (See Comments)   Cephalosporins Other (See Comments)    Other Reaction: Toxicity    REVIEW OF SYSTEMS:   Review of Systems  Constitutional:  Negative for chills, fatigue and fever.  HENT:   Negative for lump/mass, mouth sores, nosebleeds, sore throat and trouble swallowing.   Eyes:  Negative for eye problems.  Respiratory:  Positive for cough and shortness of breath.   Cardiovascular:  Negative for chest pain, leg swelling and palpitations.  Gastrointestinal:  Negative for abdominal pain, constipation, diarrhea, nausea and vomiting.  Genitourinary:  Negative for bladder incontinence, difficulty urinating, dysuria, frequency, hematuria and nocturia.   Musculoskeletal:  Negative for  arthralgias, back pain, flank pain, myalgias and neck pain.  Skin:  Negative for itching and rash.  Neurological:  Negative for dizziness, headaches and numbness.  Hematological:  Does not bruise/bleed easily.  Psychiatric/Behavioral:  Negative for depression, sleep disturbance and suicidal ideas. The patient is not nervous/anxious.   All other systems reviewed and are negative.    VITALS:   There were no vitals taken for this visit.  Wt Readings from Last 3 Encounters:  11/22/22 138 lb (62.6 kg)  11/05/22 139 lb (63 kg)  10/25/22 139 lb 6.4 oz (63.2 kg)    There is no height or weight on file to calculate BMI.  Performance status (ECOG): 1 - Symptomatic but completely ambulatory  PHYSICAL EXAM:   Physical Exam Vitals and nursing note reviewed. Exam conducted with a chaperone present.  Constitutional:      Appearance: Normal appearance.  Cardiovascular:     Rate and Rhythm: Normal rate and regular rhythm.     Pulses: Normal pulses.     Heart sounds: Normal heart sounds.  Pulmonary:     Effort: Pulmonary effort is normal.     Breath sounds: Normal breath sounds.  Abdominal:     Palpations: Abdomen is soft. There is no hepatomegaly, splenomegaly or mass.     Tenderness: There is no abdominal tenderness.  Musculoskeletal:     Right lower leg: No edema.     Left lower leg: No edema.  Lymphadenopathy:     Cervical: No cervical adenopathy.     Right cervical: No superficial, deep or posterior cervical adenopathy.    Left cervical: No superficial, deep or posterior cervical adenopathy.     Upper Body:  Right upper body: No supraclavicular or axillary adenopathy.     Left upper body: No supraclavicular or axillary adenopathy.  Neurological:     General: No focal deficit present.     Mental Status: She is alert and oriented to person, place, and time.  Psychiatric:        Mood and Affect: Mood normal.        Behavior: Behavior normal.     LABS:      Latest Ref Rng  & Units 11/22/2022   11:09 AM 11/01/2022   12:09 PM 10/25/2022    8:34 AM  CBC  WBC 4.0 - 10.5 K/uL 3.0  2.2  2.5   Hemoglobin 12.0 - 15.0 g/dL 16.1  8.8  9.5   Hematocrit 36.0 - 46.0 % 32.6  28.0  30.7   Platelets 150 - 400 K/uL 237  103  227       Latest Ref Rng & Units 11/22/2022   11:09 AM 11/01/2022   12:09 PM 10/25/2022    8:34 AM  CMP  Glucose 70 - 99 mg/dL 096  045  99   BUN 8 - 23 mg/dL 12  17  8    Creatinine 0.44 - 1.00 mg/dL 4.09  8.11  9.14   Sodium 135 - 145 mmol/L 137  135  137   Potassium 3.5 - 5.1 mmol/L 4.0  4.1  3.8   Chloride 98 - 111 mmol/L 106  103  105   CO2 22 - 32 mmol/L 25  27  25    Calcium 8.9 - 10.3 mg/dL 9.3  9.1  9.1   Total Protein 6.5 - 8.1 g/dL 7.3  7.1  6.9   Total Bilirubin 0.3 - 1.2 mg/dL 1.3  0.9  1.0   Alkaline Phos 38 - 126 U/L 65  70  58   AST 15 - 41 U/L 31  22  25    ALT 0 - 44 U/L 14  14  11       No results found for: "CEA1", "CEA" / No results found for: "CEA1", "CEA" No results found for: "PSA1" No results found for: "CAN199" No results found for: "CAN125"  Lab Results  Component Value Date   TOTALPROTELP 6.3 04/26/2022   ALBUMINELP 3.8 04/26/2022   A1GS 0.3 04/26/2022   A2GS 0.5 04/26/2022   BETS 0.8 04/26/2022   GAMS 0.9 04/26/2022   MSPIKE Not Observed 04/26/2022   SPEI Comment 04/26/2022   Lab Results  Component Value Date   TIBC 343 04/26/2022   FERRITIN 114 04/26/2022   IRONPCTSAT 25 04/26/2022   Lab Results  Component Value Date   LDH 710 (H) 11/22/2022   LDH 531 (H) 10/25/2022   LDH 305 (H) 08/30/2022     STUDIES:   No results found.

## 2022-11-22 ENCOUNTER — Inpatient Hospital Stay: Payer: Medicare HMO | Attending: Hematology

## 2022-11-22 ENCOUNTER — Other Ambulatory Visit: Payer: Self-pay | Admitting: *Deleted

## 2022-11-22 ENCOUNTER — Inpatient Hospital Stay: Payer: Medicare HMO | Admitting: Hematology

## 2022-11-22 ENCOUNTER — Encounter: Payer: Self-pay | Admitting: Hematology

## 2022-11-22 ENCOUNTER — Inpatient Hospital Stay: Payer: Medicare HMO

## 2022-11-22 VITALS — BP 113/70 | HR 70 | Temp 98.1°F | Resp 18

## 2022-11-22 DIAGNOSIS — Z800 Family history of malignant neoplasm of digestive organs: Secondary | ICD-10-CM | POA: Insufficient documentation

## 2022-11-22 DIAGNOSIS — E538 Deficiency of other specified B group vitamins: Secondary | ICD-10-CM | POA: Diagnosis not present

## 2022-11-22 DIAGNOSIS — R059 Cough, unspecified: Secondary | ICD-10-CM | POA: Insufficient documentation

## 2022-11-22 DIAGNOSIS — D696 Thrombocytopenia, unspecified: Secondary | ICD-10-CM | POA: Diagnosis not present

## 2022-11-22 DIAGNOSIS — Z5111 Encounter for antineoplastic chemotherapy: Secondary | ICD-10-CM | POA: Diagnosis not present

## 2022-11-22 DIAGNOSIS — J45909 Unspecified asthma, uncomplicated: Secondary | ICD-10-CM | POA: Insufficient documentation

## 2022-11-22 DIAGNOSIS — Z833 Family history of diabetes mellitus: Secondary | ICD-10-CM | POA: Insufficient documentation

## 2022-11-22 DIAGNOSIS — Z7951 Long term (current) use of inhaled steroids: Secondary | ICD-10-CM | POA: Diagnosis not present

## 2022-11-22 DIAGNOSIS — Z818 Family history of other mental and behavioral disorders: Secondary | ICD-10-CM | POA: Diagnosis not present

## 2022-11-22 DIAGNOSIS — Z95828 Presence of other vascular implants and grafts: Secondary | ICD-10-CM

## 2022-11-22 DIAGNOSIS — R0602 Shortness of breath: Secondary | ICD-10-CM | POA: Insufficient documentation

## 2022-11-22 DIAGNOSIS — C9310 Chronic myelomonocytic leukemia not having achieved remission: Secondary | ICD-10-CM

## 2022-11-22 DIAGNOSIS — Z79899 Other long term (current) drug therapy: Secondary | ICD-10-CM | POA: Diagnosis not present

## 2022-11-22 DIAGNOSIS — R7402 Elevation of levels of lactic acid dehydrogenase (LDH): Secondary | ICD-10-CM | POA: Insufficient documentation

## 2022-11-22 DIAGNOSIS — K219 Gastro-esophageal reflux disease without esophagitis: Secondary | ICD-10-CM | POA: Diagnosis not present

## 2022-11-22 DIAGNOSIS — K5900 Constipation, unspecified: Secondary | ICD-10-CM | POA: Insufficient documentation

## 2022-11-22 DIAGNOSIS — Z83518 Family history of other specified eye disorder: Secondary | ICD-10-CM | POA: Diagnosis not present

## 2022-11-22 DIAGNOSIS — Z807 Family history of other malignant neoplasms of lymphoid, hematopoietic and related tissues: Secondary | ICD-10-CM | POA: Diagnosis not present

## 2022-11-22 DIAGNOSIS — Z8249 Family history of ischemic heart disease and other diseases of the circulatory system: Secondary | ICD-10-CM | POA: Diagnosis not present

## 2022-11-22 DIAGNOSIS — D539 Nutritional anemia, unspecified: Secondary | ICD-10-CM | POA: Diagnosis not present

## 2022-11-22 DIAGNOSIS — R634 Abnormal weight loss: Secondary | ICD-10-CM | POA: Diagnosis not present

## 2022-11-22 DIAGNOSIS — Z882 Allergy status to sulfonamides status: Secondary | ICD-10-CM | POA: Diagnosis not present

## 2022-11-22 LAB — CBC WITH DIFFERENTIAL/PLATELET
Abs Immature Granulocytes: 0.1 10*3/uL — ABNORMAL HIGH (ref 0.00–0.07)
Band Neutrophils: 1 %
Basophils Absolute: 0 10*3/uL (ref 0.0–0.1)
Basophils Relative: 0 %
Eosinophils Absolute: 0 10*3/uL (ref 0.0–0.5)
Eosinophils Relative: 0 %
HCT: 32.6 % — ABNORMAL LOW (ref 36.0–46.0)
Hemoglobin: 10.1 g/dL — ABNORMAL LOW (ref 12.0–15.0)
Lymphocytes Relative: 21 %
Lymphs Abs: 0.6 10*3/uL — ABNORMAL LOW (ref 0.7–4.0)
MCH: 30.4 pg (ref 26.0–34.0)
MCHC: 31 g/dL (ref 30.0–36.0)
MCV: 98.2 fL (ref 80.0–100.0)
Metamyelocytes Relative: 1 %
Monocytes Absolute: 0.2 10*3/uL (ref 0.1–1.0)
Monocytes Relative: 8 %
Myelocytes: 2 %
Neutro Abs: 2 10*3/uL (ref 1.7–7.7)
Neutrophils Relative %: 67 %
Platelets: 237 10*3/uL (ref 150–400)
RBC: 3.32 MIL/uL — ABNORMAL LOW (ref 3.87–5.11)
RDW: 19.8 % — ABNORMAL HIGH (ref 11.5–15.5)
WBC: 3 10*3/uL — ABNORMAL LOW (ref 4.0–10.5)
nRBC: 0.7 % — ABNORMAL HIGH (ref 0.0–0.2)

## 2022-11-22 LAB — COMPREHENSIVE METABOLIC PANEL
ALT: 14 U/L (ref 0–44)
AST: 31 U/L (ref 15–41)
Albumin: 4.2 g/dL (ref 3.5–5.0)
Alkaline Phosphatase: 65 U/L (ref 38–126)
Anion gap: 6 (ref 5–15)
BUN: 12 mg/dL (ref 8–23)
CO2: 25 mmol/L (ref 22–32)
Calcium: 9.3 mg/dL (ref 8.9–10.3)
Chloride: 106 mmol/L (ref 98–111)
Creatinine, Ser: 0.84 mg/dL (ref 0.44–1.00)
GFR, Estimated: >60 mL/min (ref >60)
Glucose, Bld: 106 mg/dL — ABNORMAL HIGH (ref 70–99)
Potassium: 4 mmol/L (ref 3.5–5.1)
Sodium: 137 mmol/L (ref 135–145)
Total Bilirubin: 1.3 mg/dL — ABNORMAL HIGH (ref 0.3–1.2)
Total Protein: 7.3 g/dL (ref 6.5–8.1)

## 2022-11-22 LAB — LACTATE DEHYDROGENASE: LDH: 710 U/L — ABNORMAL HIGH (ref 98–192)

## 2022-11-22 LAB — MAGNESIUM: Magnesium: 2.2 mg/dL (ref 1.7–2.4)

## 2022-11-22 MED ORDER — SODIUM CHLORIDE 0.9% FLUSH: 10.0000 mL | Freq: Once | INTRAVENOUS | Status: DC

## 2022-11-22 MED ORDER — SODIUM CHLORIDE 0.9% FLUSH
10.0000 mL | Freq: Once | INTRAVENOUS | Status: AC
  Administered 2022-11-22: 10 mL via INTRAVENOUS

## 2022-11-22 MED ORDER — PALONOSETRON HCL INJECTION 0.25 MG/5ML
0.2500 mg | Freq: Once | INTRAVENOUS | Status: AC
  Administered 2022-11-22: 0.25 mg via INTRAVENOUS
  Filled 2022-11-22: qty 5

## 2022-11-22 MED ORDER — SODIUM CHLORIDE 0.9 % IV SOLN: Freq: Once | INTRAVENOUS | Status: AC

## 2022-11-22 MED ORDER — HEPARIN SOD (PORK) LOCK FLUSH 100 UNIT/ML IV SOLN: 500.0000 [IU] | Freq: Once | INTRAVENOUS | Status: DC

## 2022-11-22 MED ORDER — SODIUM CHLORIDE 0.9 % IV SOLN
75.0000 mg/m2 | Freq: Once | INTRAVENOUS | Status: AC
  Administered 2022-11-22: 126 mg via INTRAVENOUS
  Filled 2022-11-22: qty 12.6

## 2022-11-22 NOTE — Patient Instructions (Signed)
[facility] at [facility] Discharge Instructions   You were seen and examined today by Dr. Ellin Saba.  He reviewed the results of your lab work which are normal/stable.   He received a call from Dr. Lowell Guitar regarding your bone marrow biopsy, which was good. Dr. Lowell Guitar recommends decreasing your treatment to 5 days a week every 4 weeks.   We will proceed with your treatment today.   Return as scheduled.    Thank you for choosing [facility] at [facility] to provide your oncology and hematology care.  To afford each patient quality time with our provider, please arrive at least 15 minutes before your scheduled appointment time.   If you have a lab appointment with the Cancer Center please come in thru the Main Entrance and check in at the main information desk.  You need to re-schedule your appointment should you arrive 10 or more minutes late.  We strive to give you quality time with our providers, and arriving late affects you and other patients whose appointments are after yours.  Also, if you no show three or more times for appointments you may be dismissed from the clinic at the providers discretion.     Again, thank you for choosing [facility].  Our hope is that these requests will decrease the amount of time that you wait before being seen by our physicians.       _____________________________________________________________  Should you have questions after your visit to [facility], please contact our office at [phone] and follow the prompts.  Our office hours are 8:00 a.m. and 4:30 p.m. Monday - Friday.  Please note that voicemails left after 4:00 p.m. may not be returned until the following business day.  We are closed weekends and major holidays.  You do have access to a nurse 24-7, just call the main number to the clinic [phone] and do not press any options, hold on the line and a nurse will answer  the phone.    For prescription refill requests, have your pharmacy contact our office and allow 72 hours.    Due to Covid, you will need to wear a mask upon entering the hospital. If you do not have a mask, a mask will be given to you at the Main Entrance upon arrival. For doctor visits, patients may have 1 support person age 60 or older with them. For treatment visits, patients can not have anyone with them due to social distancing guidelines and our immunocompromised population.

## 2022-11-22 NOTE — Patient Instructions (Signed)
[facility]  Discharge Instructions: Thank you for choosing [facility] to provide your oncology and hematology care.  If you have a lab appointment with the Cancer Center - please note that after April 8th, 2024, all labs will be drawn in the cancer center.  You do not have to check in or register with the main entrance as you have in the past but will complete your check-in in the cancer center.  Wear comfortable clothing and clothing appropriate for easy access to any Portacath or PICC line.   We strive to give you quality time with your provider. You may need to reschedule your appointment if you arrive late (15 or more minutes).  Arriving late affects you and other patients whose appointments are after yours.  Also, if you miss three or more appointments without notifying the office, you may be dismissed from the clinic at the provider's discretion.      For prescription refill requests, have your pharmacy contact our office and allow 72 hours for refills to be completed.  To help prevent nausea and vomiting after your treatment, we encourage you to take your nausea medication as directed.  BELOW ARE SYMPTOMS THAT SHOULD BE REPORTED IMMEDIATELY: *FEVER GREATER THAN 100.4 F (38 C) OR HIGHER *CHILLS OR SWEATING *NAUSEA AND VOMITING THAT IS NOT CONTROLLED WITH YOUR NAUSEA MEDICATION *UNUSUAL SHORTNESS OF BREATH *UNUSUAL BRUISING OR BLEEDING *URINARY PROBLEMS (pain or burning when urinating, or frequent urination) *BOWEL PROBLEMS (unusual diarrhea, constipation, pain near the anus) TENDERNESS IN MOUTH AND THROAT WITH OR WITHOUT PRESENCE OF ULCERS (sore throat, sores in mouth, or a toothache) UNUSUAL RASH, SWELLING OR PAIN  UNUSUAL VAGINAL DISCHARGE OR ITCHING   Items with * indicate a potential emergency and should be followed up as soon as possible or go to the Emergency Department if any problems should occur.  Please show the CHEMOTHERAPY ALERT CARD or  IMMUNOTHERAPY ALERT CARD at check-in to the Emergency Department and triage nurse.  Should you have questions after your visit or need to cancel or reschedule your appointment, please contact [facility] [phone]  and follow the prompts.  Office hours are 8:00 a.m. to 4:30 p.m. Monday - Friday. Please note that voicemails left after 4:00 p.m. may not be returned until the following business day.  We are closed weekends and major holidays. You have access to a nurse at all times for urgent questions. Please call the main number to the clinic [phone] and follow the prompts.  For any non-urgent questions, you may also contact your provider using MyChart. We now offer e-Visits for anyone 18 and older to request care online for non-urgent symptoms. For details visit [email or web address].   Also download the MyChart app! Go to the app store, search "MyChart", open the app, select [facility], and log in with your MyChart username and password.   

## 2022-11-22 NOTE — Progress Notes (Signed)
1448-patient complains of "feeling lightheaded" and "flushed".  Treatment stopped with NACL 521ml/hr bolus started and Dr. Ellin Saba notified with verbal order to hold treatment until the patient feels better and restart.  See flowsheet for Vital signs.   1510-patient stated she still feels lightheaded.  Vital signs stable.  Resting with family at side.   Dr. Ellin Saba notified.    1518-Restart Vidaza with NACL bolus verbal order Dr. Ellin Saba.   Patient tolerated chemotherapy with no complaints voiced.  Side effects with management reviewed with understanding verbalized.  Port site clean and dry with no bruising or swelling noted at site.  Good blood return noted before and after administration of chemotherapy.  Dressing intact.   Patient left in satisfactory condition with VSS and no s/s of distress noted.

## 2022-11-23 ENCOUNTER — Telehealth: Payer: Self-pay

## 2022-11-23 ENCOUNTER — Inpatient Hospital Stay: Payer: Medicare HMO

## 2022-11-23 VITALS — BP 101/66 | HR 79 | Temp 98.3°F | Resp 18

## 2022-11-23 DIAGNOSIS — K5900 Constipation, unspecified: Secondary | ICD-10-CM | POA: Diagnosis not present

## 2022-11-23 DIAGNOSIS — R634 Abnormal weight loss: Secondary | ICD-10-CM | POA: Diagnosis not present

## 2022-11-23 DIAGNOSIS — Z95828 Presence of other vascular implants and grafts: Secondary | ICD-10-CM

## 2022-11-23 DIAGNOSIS — D539 Nutritional anemia, unspecified: Secondary | ICD-10-CM | POA: Diagnosis not present

## 2022-11-23 DIAGNOSIS — E538 Deficiency of other specified B group vitamins: Secondary | ICD-10-CM | POA: Diagnosis not present

## 2022-11-23 DIAGNOSIS — R7402 Elevation of levels of lactic acid dehydrogenase (LDH): Secondary | ICD-10-CM | POA: Diagnosis not present

## 2022-11-23 DIAGNOSIS — J45909 Unspecified asthma, uncomplicated: Secondary | ICD-10-CM | POA: Diagnosis not present

## 2022-11-23 DIAGNOSIS — C9310 Chronic myelomonocytic leukemia not having achieved remission: Secondary | ICD-10-CM | POA: Diagnosis not present

## 2022-11-23 DIAGNOSIS — Z5111 Encounter for antineoplastic chemotherapy: Secondary | ICD-10-CM | POA: Diagnosis not present

## 2022-11-23 DIAGNOSIS — D696 Thrombocytopenia, unspecified: Secondary | ICD-10-CM | POA: Diagnosis not present

## 2022-11-23 MED ORDER — SODIUM CHLORIDE 0.9 % IV SOLN: Freq: Once | INTRAVENOUS | Status: AC

## 2022-11-23 MED ORDER — SODIUM CHLORIDE 0.9% FLUSH
10.0000 mL | Freq: Once | INTRAVENOUS | Status: AC
  Administered 2022-11-23: 10 mL via INTRAVENOUS

## 2022-11-23 MED ORDER — SODIUM CHLORIDE 0.9 % IV SOLN
75.0000 mg/m2 | Freq: Once | INTRAVENOUS | Status: AC
  Administered 2022-11-23: 126 mg via INTRAVENOUS
  Filled 2022-11-23: qty 12.6

## 2022-11-23 MED ORDER — HEPARIN SOD (PORK) LOCK FLUSH 100 UNIT/ML IV SOLN
500.0000 [IU] | Freq: Once | INTRAVENOUS | Status: AC
  Administered 2022-11-23: 500 [IU] via INTRAVENOUS

## 2022-11-23 NOTE — Patient Instructions (Signed)
[facility]  Discharge Instructions: Thank you for choosing [facility] to provide your oncology and hematology care.  If you have a lab appointment with the Cancer Center - please note that after April 8th, 2024, all labs will be drawn in the cancer center.  You do not have to check in or register with the main entrance as you have in the past but will complete your check-in in the cancer center.  Wear comfortable clothing and clothing appropriate for easy access to any Portacath or PICC line.   We strive to give you quality time with your provider. You may need to reschedule your appointment if you arrive late (15 or more minutes).  Arriving late affects you and other patients whose appointments are after yours.  Also, if you miss three or more appointments without notifying the office, you may be dismissed from the clinic at the provider's discretion.      For prescription refill requests, have your pharmacy contact our office and allow 72 hours for refills to be completed.    Today you received the following chemotherapy and/or immunotherapy agents Vidaza   To help prevent nausea and vomiting after your treatment, we encourage you to take your nausea medication as directed.  BELOW ARE SYMPTOMS THAT SHOULD BE REPORTED IMMEDIATELY: *FEVER GREATER THAN 100.4 F (38 C) OR HIGHER *CHILLS OR SWEATING *NAUSEA AND VOMITING THAT IS NOT CONTROLLED WITH YOUR NAUSEA MEDICATION *UNUSUAL SHORTNESS OF BREATH *UNUSUAL BRUISING OR BLEEDING *URINARY PROBLEMS (pain or burning when urinating, or frequent urination) *BOWEL PROBLEMS (unusual diarrhea, constipation, pain near the anus) TENDERNESS IN MOUTH AND THROAT WITH OR WITHOUT PRESENCE OF ULCERS (sore throat, sores in mouth, or a toothache) UNUSUAL RASH, SWELLING OR PAIN  UNUSUAL VAGINAL DISCHARGE OR ITCHING   Items with * indicate a potential emergency and should be followed up as soon as possible or go to the  Emergency Department if any problems should occur.  Please show the CHEMOTHERAPY ALERT CARD or IMMUNOTHERAPY ALERT CARD at check-in to the Emergency Department and triage nurse.  Should you have questions after your visit or need to cancel or reschedule your appointment, please contact [facility] [phone]  and follow the prompts.  Office hours are 8:00 a.m. to 4:30 p.m. Monday - Friday. Please note that voicemails left after 4:00 p.m. may not be returned until the following business day.  We are closed weekends and major holidays. You have access to a nurse at all times for urgent questions. Please call the main number to the clinic [phone] and follow the prompts.  For any non-urgent questions, you may also contact your provider using MyChart. We now offer e-Visits for anyone 18 and older to request care online for non-urgent symptoms. For details visit [email or web address].   Also download the MyChart app! Go to the app store, search "MyChart", open the app, select [facility], and log in with your MyChart username and password.   

## 2022-11-23 NOTE — Progress Notes (Signed)
Treatment given today per MD orders.  Stable during infusion without adverse affects.  Vital signs stable.  No complaints at this time.  Discharge from clinic ambulatory in stable condition.  Alert and oriented X 3.  Follow up with [facility] as scheduled.  

## 2022-11-23 NOTE — Progress Notes (Signed)
Patient presents today for D2 Vidaza. Vital signs within parameters for treatment. Patient denies any changes since her last treatment. No complaints noted today.

## 2022-11-23 NOTE — Telephone Encounter (Signed)
Hypersensitivity infusion follow up phone call.  No answer and voicemail left to call the cancer center.  Patient has infusion for 11/23/2022 scheduled.

## 2022-11-24 ENCOUNTER — Inpatient Hospital Stay: Payer: Medicare HMO

## 2022-11-24 VITALS — BP 100/64 | HR 73 | Temp 97.8°F | Resp 16

## 2022-11-24 DIAGNOSIS — J45909 Unspecified asthma, uncomplicated: Secondary | ICD-10-CM | POA: Diagnosis not present

## 2022-11-24 DIAGNOSIS — Z5111 Encounter for antineoplastic chemotherapy: Secondary | ICD-10-CM | POA: Diagnosis not present

## 2022-11-24 DIAGNOSIS — D539 Nutritional anemia, unspecified: Secondary | ICD-10-CM | POA: Diagnosis not present

## 2022-11-24 DIAGNOSIS — C9310 Chronic myelomonocytic leukemia not having achieved remission: Secondary | ICD-10-CM

## 2022-11-24 DIAGNOSIS — R634 Abnormal weight loss: Secondary | ICD-10-CM | POA: Diagnosis not present

## 2022-11-24 DIAGNOSIS — K5900 Constipation, unspecified: Secondary | ICD-10-CM | POA: Diagnosis not present

## 2022-11-24 DIAGNOSIS — Z95828 Presence of other vascular implants and grafts: Secondary | ICD-10-CM

## 2022-11-24 DIAGNOSIS — D696 Thrombocytopenia, unspecified: Secondary | ICD-10-CM | POA: Diagnosis not present

## 2022-11-24 DIAGNOSIS — R7402 Elevation of levels of lactic acid dehydrogenase (LDH): Secondary | ICD-10-CM | POA: Diagnosis not present

## 2022-11-24 DIAGNOSIS — E538 Deficiency of other specified B group vitamins: Secondary | ICD-10-CM | POA: Diagnosis not present

## 2022-11-24 MED ORDER — SODIUM CHLORIDE 0.9% FLUSH
10.0000 mL | Freq: Once | INTRAVENOUS | Status: AC
  Administered 2022-11-24: 10 mL via INTRAVENOUS

## 2022-11-24 MED ORDER — SODIUM CHLORIDE 0.9 % IV SOLN: Freq: Once | INTRAVENOUS | Status: AC

## 2022-11-24 MED ORDER — HEPARIN SOD (PORK) LOCK FLUSH 100 UNIT/ML IV SOLN
500.0000 [IU] | Freq: Once | INTRAVENOUS | Status: AC
  Administered 2022-11-24: 500 [IU] via INTRAVENOUS

## 2022-11-24 MED ORDER — PALONOSETRON HCL INJECTION 0.25 MG/5ML
0.2500 mg | Freq: Once | INTRAVENOUS | Status: AC
  Administered 2022-11-24: 0.25 mg via INTRAVENOUS
  Filled 2022-11-24: qty 5

## 2022-11-24 MED ORDER — SODIUM CHLORIDE 0.9 % IV SOLN
75.0000 mg/m2 | Freq: Once | INTRAVENOUS | Status: AC
  Administered 2022-11-24: 126 mg via INTRAVENOUS
  Filled 2022-11-24: qty 12.6

## 2022-11-24 MED ORDER — SODIUM CHLORIDE 0.9% FLUSH
10.0000 mL | INTRAVENOUS | Status: AC
  Administered 2022-11-24: 10 mL via INTRAVENOUS

## 2022-11-24 NOTE — Patient Instructions (Signed)
[facility] [place] [facility]  Discharge Instructions: Thank you for choosing  Cancer Center to provide your oncology and hematology care.  If you have a lab appointment with the Cancer Center - please note that after April 8th, 2024, all labs will be drawn in the cancer center.  You do not have to check in or register with the main entrance as you have in the past but will complete your check-in in the cancer center.  Wear comfortable clothing and clothing appropriate for easy access to any Portacath or PICC line.   We strive to give you quality time with your provider. You may need to reschedule your appointment if you arrive late (15 or more minutes).  Arriving late affects you and other patients whose appointments are after yours.  Also, if you miss three or more appointments without notifying the office, you may be dismissed from the clinic at the provider's discretion.      For prescription refill requests, have your pharmacy contact our office and allow 72 hours for refills to be completed.    Today you received the following chemotherapy and/or immunotherapy agents Vidaza.  Azacitidine Injection What is this medication? AZACITIDINE (ay za SITE i deen) treats blood and bone marrow cancers. It works by slowing down the growth of cancer cells. This medicine may be used for other purposes; ask your health care provider or pharmacist if you have questions. COMMON BRAND NAME(S): Vidaza What should I tell my care team before I take this medication? They need to know if you have any of these conditions: Kidney disease Liver disease Low blood cell levels, such as low white cells, platelets, or red blood cells Low levels of albumin in the blood Low levels of bicarbonate in the blood An unusual or allergic reaction to azacitidine, mannitol, other medications, foods, dyes, or preservatives If you or your partner are pregnant or trying to get pregnant Breast-feeding How should I  use this medication? This medication is injected into a vein or under the skin. It is given by your care team in a hospital or clinic setting. Talk to your care team about the use of this medication in children. While it may be prescribed for children as young as 1 month for selected conditions, precautions do apply. Overdosage: If you think you have taken too much of this medicine contact a poison control center or emergency room at once. NOTE: This medicine is only for you. Do not share this medicine with others. What if I miss a dose? Keep appointments for follow-up doses. It is important not to miss your dose. Call your care team if you are unable to keep an appointment. What may interact with this medication? Interactions are not expected. This list may not describe all possible interactions. Give your health care provider a list of all the medicines, herbs, non-prescription drugs, or dietary supplements you use. Also tell them if you smoke, drink alcohol, or use illegal drugs. Some items may interact with your medicine. What should I watch for while using this medication? Your condition will be monitored carefully while you are receiving this medication. You may need blood work while taking this medication. This medication may make you feel generally unwell. This is not uncommon as chemotherapy can affect healthy cells as well as cancer cells. Report any side effects. Continue your course of treatment even though you feel ill unless your care team tells you to stop. Other product types may be available that contain the   medication azacitidine. The injection and oral products should not be used in place of one another. Talk to your care team if you have questions. This medication can cause serious side effects. To reduce the risk, your care team may give you other medications to take before receiving this one. Be sure to follow the directions from your care team. This medication may increase your  risk of getting an infection. Call your care team for advice if you get a fever, chills, sore throat, or other symptoms of a cold or flu. Do not treat yourself. Try to avoid being around people who are sick. Avoid taking medications that contain aspirin, acetaminophen, ibuprofen, naproxen, or ketoprofen unless instructed by your care team. These medications may hide a fever. This medication may increase your risk to bruise or bleed. Call your care team if you notice any unusual bleeding. Be careful brushing or flossing your teeth or using a toothpick because you may get an infection or bleed more easily. If you have any dental work done, tell your dentist you are receiving this medication. Talk to your care team if you or your partner may be pregnant. Serious birth defects can occur if you take this medication during pregnancy and for 6 months after the last dose. You will need a negative pregnancy test before starting this medication. Contraception is recommended while taking his medication and for 6 months after the last dose. Your care team can help you find the option that works for you. If your partner can get pregnant, use a condom during sex while taking this medication and for 3 months after the last dose. Do not breastfeed while taking this medication and for 1 week after the last dose. This medication may cause infertility. Talk to your care team if you are concerned about your fertility. What side effects may I notice from receiving this medication? Side effects that you should report to your care team as soon as possible: Allergic reactions--skin rash, itching, hives, swelling of the face, lips, tongue, or throat Infection--fever, chills, cough, sore throat, wounds that don't heal, pain or trouble when passing urine, general feeling of discomfort or being unwell Kidney injury--decrease in the amount of urine, swelling of the ankles, hands, or feet Liver injury--right upper belly pain, loss  of appetite, nausea, light-colored stool, dark yellow or brown urine, yellowing skin or eyes, unusual weakness or fatigue Low red blood cell level--unusual weakness or fatigue, dizziness, headache, trouble breathing Tumor lysis syndrome (TLS)--nausea, vomiting, diarrhea, decrease in the amount of urine, dark urine, unusual weakness or fatigue, confusion, muscle pain or cramps, fast or irregular heartbeat, joint pain Unusual bruising or bleeding Side effects that usually do not require medical attention (report to your care team if they continue or are bothersome): Constipation Diarrhea Nausea Pain, redness, or irritation at injection site Vomiting This list may not describe all possible side effects. Call your doctor for medical advice about side effects. You may report side effects to FDA at 1-800-FDA-1088. Where should I keep my medication? This medication is given in a hospital or clinic. It will not be stored at home. NOTE: This sheet is a summary. It may not cover all possible information. If you have questions about this medicine, talk to your doctor, pharmacist, or health care provider.  2023 Elsevier/Gold Standard (2021-11-19 00:00:00)        To help prevent nausea and vomiting after your treatment, we encourage you to take your nausea medication as directed.  BELOW ARE SYMPTOMS   THAT SHOULD BE REPORTED IMMEDIATELY: *FEVER GREATER THAN 100.4 F (38 C) OR HIGHER *CHILLS OR SWEATING *NAUSEA AND VOMITING THAT IS NOT CONTROLLED WITH YOUR NAUSEA MEDICATION *UNUSUAL SHORTNESS OF BREATH *UNUSUAL BRUISING OR BLEEDING *URINARY PROBLEMS (pain or burning when urinating, or frequent urination) *BOWEL PROBLEMS (unusual diarrhea, constipation, pain near the anus) TENDERNESS IN MOUTH AND THROAT WITH OR WITHOUT PRESENCE OF ULCERS (sore throat, sores in mouth, or a toothache) UNUSUAL RASH, SWELLING OR PAIN  UNUSUAL VAGINAL DISCHARGE OR ITCHING   Items with * indicate a potential emergency and  should be followed up as soon as possible or go to the Emergency Department if any problems should occur.  Please show the CHEMOTHERAPY ALERT CARD or IMMUNOTHERAPY ALERT CARD at check-in to the Emergency Department and triage nurse.  Should you have questions after your visit or need to cancel or reschedule your appointment, please contact [facility] [phone]  and follow the prompts.  Office hours are 8:00 a.m. to 4:30 p.m. Monday - Friday. Please note that voicemails left after 4:00 p.m. may not be returned until the following business day.  We are closed weekends and major holidays. You have access to a nurse at all times for urgent questions. Please call the main number to the clinic [phone] and follow the prompts.  For any non-urgent questions, you may also contact your provider using MyChart. We now offer e-Visits for anyone 18 and older to request care online for non-urgent symptoms. For details visit [email or web address].   Also download the MyChart app! Go to the app store, search "MyChart", open the app, select [facility], and log in with your MyChart username and password.   

## 2022-11-24 NOTE — Progress Notes (Signed)
Vidaza given today per MD orders. Tolerated infusion without adverse affects. Vital signs stable. No complaints at this time. Discharged from clinic ambulatory in stable condition. Alert and oriented x 3. F/U with [facility] as scheduled.   

## 2022-11-25 ENCOUNTER — Inpatient Hospital Stay: Payer: Medicare HMO

## 2022-11-25 VITALS — BP 118/95 | HR 90 | Temp 96.6°F | Resp 18

## 2022-11-25 DIAGNOSIS — D539 Nutritional anemia, unspecified: Secondary | ICD-10-CM | POA: Diagnosis not present

## 2022-11-25 DIAGNOSIS — D696 Thrombocytopenia, unspecified: Secondary | ICD-10-CM | POA: Diagnosis not present

## 2022-11-25 DIAGNOSIS — J45909 Unspecified asthma, uncomplicated: Secondary | ICD-10-CM | POA: Diagnosis not present

## 2022-11-25 DIAGNOSIS — Z5111 Encounter for antineoplastic chemotherapy: Secondary | ICD-10-CM | POA: Diagnosis not present

## 2022-11-25 DIAGNOSIS — K5900 Constipation, unspecified: Secondary | ICD-10-CM | POA: Diagnosis not present

## 2022-11-25 DIAGNOSIS — R7402 Elevation of levels of lactic acid dehydrogenase (LDH): Secondary | ICD-10-CM | POA: Diagnosis not present

## 2022-11-25 DIAGNOSIS — R634 Abnormal weight loss: Secondary | ICD-10-CM | POA: Diagnosis not present

## 2022-11-25 DIAGNOSIS — C9310 Chronic myelomonocytic leukemia not having achieved remission: Secondary | ICD-10-CM

## 2022-11-25 DIAGNOSIS — Z95828 Presence of other vascular implants and grafts: Secondary | ICD-10-CM

## 2022-11-25 DIAGNOSIS — E538 Deficiency of other specified B group vitamins: Secondary | ICD-10-CM | POA: Diagnosis not present

## 2022-11-25 MED ORDER — SODIUM CHLORIDE 0.9% FLUSH
10.0000 mL | Freq: Once | INTRAVENOUS | Status: AC
  Administered 2022-11-25: 10 mL via INTRAVENOUS

## 2022-11-25 MED ORDER — SODIUM CHLORIDE 0.9 % IV SOLN: Freq: Once | INTRAVENOUS | Status: AC

## 2022-11-25 MED ORDER — SODIUM CHLORIDE 0.9 % IV SOLN
75.0000 mg/m2 | Freq: Once | INTRAVENOUS | Status: AC
  Administered 2022-11-25: 126 mg via INTRAVENOUS
  Filled 2022-11-25: qty 12.6

## 2022-11-25 MED ORDER — HEPARIN SOD (PORK) LOCK FLUSH 100 UNIT/ML IV SOLN
500.0000 [IU] | Freq: Once | INTRAVENOUS | Status: AC
  Administered 2022-11-25: 500 [IU] via INTRAVENOUS

## 2022-11-25 NOTE — Patient Instructions (Signed)
[facility]  Discharge Instructions: Thank you for choosing [facility] to provide your oncology and hematology care.  If you have a lab appointment with the Cancer Center - please note that after April 8th, 2024, all labs will be drawn in the cancer center.  You do not have to check in or register with the main entrance as you have in the past but will complete your check-in in the cancer center.  Wear comfortable clothing and clothing appropriate for easy access to any Portacath or PICC line.   We strive to give you quality time with your provider. You may need to reschedule your appointment if you arrive late (15 or more minutes).  Arriving late affects you and other patients whose appointments are after yours.  Also, if you miss three or more appointments without notifying the office, you may be dismissed from the clinic at the provider's discretion.      For prescription refill requests, have your pharmacy contact our office and allow 72 hours for refills to be completed.    Today you received the following chemotherapy and/or immunotherapy agents: azacitadine      To help prevent nausea and vomiting after your treatment, we encourage you to take your nausea medication as directed.  BELOW ARE SYMPTOMS THAT SHOULD BE REPORTED IMMEDIATELY: *FEVER GREATER THAN 100.4 F (38 C) OR HIGHER *CHILLS OR SWEATING *NAUSEA AND VOMITING THAT IS NOT CONTROLLED WITH YOUR NAUSEA MEDICATION *UNUSUAL SHORTNESS OF BREATH *UNUSUAL BRUISING OR BLEEDING *URINARY PROBLEMS (pain or burning when urinating, or frequent urination) *BOWEL PROBLEMS (unusual diarrhea, constipation, pain near the anus) TENDERNESS IN MOUTH AND THROAT WITH OR WITHOUT PRESENCE OF ULCERS (sore throat, sores in mouth, or a toothache) UNUSUAL RASH, SWELLING OR PAIN  UNUSUAL VAGINAL DISCHARGE OR ITCHING   Items with * indicate a potential emergency and should be followed up as soon as possible or go to  the Emergency Department if any problems should occur.  Please show the CHEMOTHERAPY ALERT CARD or IMMUNOTHERAPY ALERT CARD at check-in to the Emergency Department and triage nurse.  Should you have questions after your visit or need to cancel or reschedule your appointment, please contact [facility] [phone]  and follow the prompts.  Office hours are 8:00 a.m. to 4:30 p.m. Monday - Friday. Please note that voicemails left after 4:00 p.m. may not be returned until the following business day.  We are closed weekends and major holidays. You have access to a nurse at all times for urgent questions. Please call the main number to the clinic [phone] and follow the prompts.  For any non-urgent questions, you may also contact your provider using MyChart. We now offer e-Visits for anyone 22 and older to request care online for non-urgent symptoms. For details visit [email or web address].   Also download the MyChart app! Go to the app store, search "MyChart", open the app, select [employer], and log in with your MyChart username and password.

## 2022-11-26 ENCOUNTER — Inpatient Hospital Stay: Payer: Medicare HMO

## 2022-11-26 ENCOUNTER — Other Ambulatory Visit: Payer: Self-pay | Admitting: Hematology

## 2022-11-26 VITALS — BP 103/64 | HR 80 | Temp 97.6°F | Resp 18

## 2022-11-26 DIAGNOSIS — Z5111 Encounter for antineoplastic chemotherapy: Secondary | ICD-10-CM | POA: Diagnosis not present

## 2022-11-26 DIAGNOSIS — R634 Abnormal weight loss: Secondary | ICD-10-CM | POA: Diagnosis not present

## 2022-11-26 DIAGNOSIS — D696 Thrombocytopenia, unspecified: Secondary | ICD-10-CM | POA: Diagnosis not present

## 2022-11-26 DIAGNOSIS — J45909 Unspecified asthma, uncomplicated: Secondary | ICD-10-CM | POA: Diagnosis not present

## 2022-11-26 DIAGNOSIS — G4700 Insomnia, unspecified: Secondary | ICD-10-CM

## 2022-11-26 DIAGNOSIS — C9310 Chronic myelomonocytic leukemia not having achieved remission: Secondary | ICD-10-CM

## 2022-11-26 DIAGNOSIS — K5900 Constipation, unspecified: Secondary | ICD-10-CM | POA: Diagnosis not present

## 2022-11-26 DIAGNOSIS — D539 Nutritional anemia, unspecified: Secondary | ICD-10-CM | POA: Diagnosis not present

## 2022-11-26 DIAGNOSIS — R7402 Elevation of levels of lactic acid dehydrogenase (LDH): Secondary | ICD-10-CM | POA: Diagnosis not present

## 2022-11-26 DIAGNOSIS — Z95828 Presence of other vascular implants and grafts: Secondary | ICD-10-CM

## 2022-11-26 DIAGNOSIS — E538 Deficiency of other specified B group vitamins: Secondary | ICD-10-CM | POA: Diagnosis not present

## 2022-11-26 MED ORDER — SODIUM CHLORIDE 0.9% FLUSH
10.0000 mL | Freq: Once | INTRAVENOUS | Status: AC
  Administered 2022-11-26: 10 mL via INTRAVENOUS

## 2022-11-26 MED ORDER — SODIUM CHLORIDE 0.9 % IV SOLN
75.0000 mg/m2 | Freq: Once | INTRAVENOUS | Status: AC
  Administered 2022-11-26: 126 mg via INTRAVENOUS
  Filled 2022-11-26: qty 12.6

## 2022-11-26 MED ORDER — HEPARIN SOD (PORK) LOCK FLUSH 100 UNIT/ML IV SOLN
500.0000 [IU] | Freq: Once | INTRAVENOUS | Status: AC
  Administered 2022-11-26: 500 [IU] via INTRAVENOUS

## 2022-11-26 MED ORDER — SODIUM CHLORIDE 0.9 % IV SOLN: Freq: Once | INTRAVENOUS | Status: AC

## 2022-11-26 MED ORDER — PALONOSETRON HCL INJECTION 0.25 MG/5ML
0.2500 mg | Freq: Once | INTRAVENOUS | Status: AC
  Administered 2022-11-26: 0.25 mg via INTRAVENOUS
  Filled 2022-11-26: qty 5

## 2022-11-26 MED ORDER — SODIUM CHLORIDE 0.9% FLUSH
10.0000 mL | INTRAVENOUS | Status: AC
  Administered 2022-11-26: 10 mL

## 2022-11-26 NOTE — Patient Instructions (Signed)
[facility]  Discharge Instructions: Thank you for choosing [facility] to provide your oncology and hematology care.  If you have a lab appointment with the Cancer Center - please note that after April 8th, 2024, all labs will be drawn in the cancer center.  You do not have to check in or register with the main entrance as you have in the past but will complete your check-in in the cancer center.  Wear comfortable clothing and clothing appropriate for easy access to any Portacath or PICC line.   We strive to give you quality time with your provider. You may need to reschedule your appointment if you arrive late (15 or more minutes).  Arriving late affects you and other patients whose appointments are after yours.  Also, if you miss three or more appointments without notifying the office, you may be dismissed from the clinic at the provider's discretion.      For prescription refill requests, have your pharmacy contact our office and allow 72 hours for refills to be completed.    Today you received the following chemotherapy and/or immunotherapy agents Vidaza.  Azacitidine Injection What is this medication? AZACITIDINE (ay za SITE i deen) treats blood and bone marrow cancers. It works by slowing down the growth of cancer cells. This medicine may be used for other purposes; ask your health care provider or pharmacist if you have questions. COMMON BRAND NAME(S): Vidaza What should I tell my care team before I take this medication? They need to know if you have any of these conditions: Kidney disease Liver disease Low blood cell levels, such as low white cells, platelets, or red blood cells Low levels of albumin in the blood Low levels of bicarbonate in the blood An unusual or allergic reaction to azacitidine, mannitol, other medications, foods, dyes, or preservatives If you or your partner are pregnant or trying to get pregnant Breast-feeding How should I  use this medication? This medication is injected into a vein or under the skin. It is given by your care team in a hospital or clinic setting. Talk to your care team about the use of this medication in children. While it may be prescribed for children as young as 1 month for selected conditions, precautions do apply. Overdosage: If you think you have taken too much of this medicine contact a poison control center or emergency room at once. NOTE: This medicine is only for you. Do not share this medicine with others. What if I miss a dose? Keep appointments for follow-up doses. It is important not to miss your dose. Call your care team if you are unable to keep an appointment. What may interact with this medication? Interactions are not expected. This list may not describe all possible interactions. Give your health care provider a list of all the medicines, herbs, non-prescription drugs, or dietary supplements you use. Also tell them if you smoke, drink alcohol, or use illegal drugs. Some items may interact with your medicine. What should I watch for while using this medication? Your condition will be monitored carefully while you are receiving this medication. You may need blood work while taking this medication. This medication may make you feel generally unwell. This is not uncommon as chemotherapy can affect healthy cells as well as cancer cells. Report any side effects. Continue your course of treatment even though you feel ill unless your care team tells you to stop. Other product types may be available that contain the   medication azacitidine. The injection and oral products should not be used in place of one another. Talk to your care team if you have questions. This medication can cause serious side effects. To reduce the risk, your care team may give you other medications to take before receiving this one. Be sure to follow the directions from your care team. This medication may increase your  risk of getting an infection. Call your care team for advice if you get a fever, chills, sore throat, or other symptoms of a cold or flu. Do not treat yourself. Try to avoid being around people who are sick. Avoid taking medications that contain aspirin, acetaminophen, ibuprofen, naproxen, or ketoprofen unless instructed by your care team. These medications may hide a fever. This medication may increase your risk to bruise or bleed. Call your care team if you notice any unusual bleeding. Be careful brushing or flossing your teeth or using a toothpick because you may get an infection or bleed more easily. If you have any dental work done, tell your dentist you are receiving this medication. Talk to your care team if you or your partner may be pregnant. Serious birth defects can occur if you take this medication during pregnancy and for 6 months after the last dose. You will need a negative pregnancy test before starting this medication. Contraception is recommended while taking his medication and for 6 months after the last dose. Your care team can help you find the option that works for you. If your partner can get pregnant, use a condom during sex while taking this medication and for 3 months after the last dose. Do not breastfeed while taking this medication and for 1 week after the last dose. This medication may cause infertility. Talk to your care team if you are concerned about your fertility. What side effects may I notice from receiving this medication? Side effects that you should report to your care team as soon as possible: Allergic reactions--skin rash, itching, hives, swelling of the face, lips, tongue, or throat Infection--fever, chills, cough, sore throat, wounds that don't heal, pain or trouble when passing urine, general feeling of discomfort or being unwell Kidney injury--decrease in the amount of urine, swelling of the ankles, hands, or feet Liver injury--right upper belly pain, loss  of appetite, nausea, light-colored stool, dark yellow or brown urine, yellowing skin or eyes, unusual weakness or fatigue Low red blood cell level--unusual weakness or fatigue, dizziness, headache, trouble breathing Tumor lysis syndrome (TLS)--nausea, vomiting, diarrhea, decrease in the amount of urine, dark urine, unusual weakness or fatigue, confusion, muscle pain or cramps, fast or irregular heartbeat, joint pain Unusual bruising or bleeding Side effects that usually do not require medical attention (report to your care team if they continue or are bothersome): Constipation Diarrhea Nausea Pain, redness, or irritation at injection site Vomiting This list may not describe all possible side effects. Call your doctor for medical advice about side effects. You may report side effects to FDA at 1-800-FDA-1088. Where should I keep my medication? This medication is given in a hospital or clinic. It will not be stored at home. NOTE: This sheet is a summary. It may not cover all possible information. If you have questions about this medicine, talk to your doctor, pharmacist, or health care provider.  2023 Elsevier/Gold Standard (2021-11-19 00:00:00)        To help prevent nausea and vomiting after your treatment, we encourage you to take your nausea medication as directed.  BELOW ARE SYMPTOMS   THAT SHOULD BE REPORTED IMMEDIATELY: *FEVER GREATER THAN 100.4 F (38 C) OR HIGHER *CHILLS OR SWEATING *NAUSEA AND VOMITING THAT IS NOT CONTROLLED WITH YOUR NAUSEA MEDICATION *UNUSUAL SHORTNESS OF BREATH *UNUSUAL BRUISING OR BLEEDING *URINARY PROBLEMS (pain or burning when urinating, or frequent urination) *BOWEL PROBLEMS (unusual diarrhea, constipation, pain near the anus) TENDERNESS IN MOUTH AND THROAT WITH OR WITHOUT PRESENCE OF ULCERS (sore throat, sores in mouth, or a toothache) UNUSUAL RASH, SWELLING OR PAIN  UNUSUAL VAGINAL DISCHARGE OR ITCHING   Items with * indicate a potential emergency and  should be followed up as soon as possible or go to the Emergency Department if any problems should occur.  Please show the CHEMOTHERAPY ALERT CARD or IMMUNOTHERAPY ALERT CARD at check-in to the Emergency Department and triage nurse.  Should you have questions after your visit or need to cancel or reschedule your appointment, please contact [facility] [phone]  and follow the prompts.  Office hours are 8:00 a.m. to 4:30 p.m. Monday - Friday. Please note that voicemails left after 4:00 p.m. may not be returned until the following business day.  We are closed weekends and major holidays. You have access to a nurse at all times for urgent questions. Please call the main number to the clinic [phone] and follow the prompts.  For any non-urgent questions, you may also contact your provider using MyChart. We now offer e-Visits for anyone 18 and older to request care online for non-urgent symptoms. For details visit [email or web address].   Also download the MyChart app! Go to the app store, search "MyChart", open the app, select [facility], and log in with your MyChart username and password.   

## 2022-11-26 NOTE — Progress Notes (Signed)
Patient presents today for Vidaza D5. Vital signs within parameters for treatment. Patient denies any side effects related to treatment today.   Treatment given today per MD orders. Tolerated infusion without adverse affects. Vital signs stable. No complaints at this time. Discharged from clinic ambulatory in stable condition. Alert and oriented x 3. F/U with [facility] as scheduled.

## 2022-11-29 ENCOUNTER — Inpatient Hospital Stay: Payer: Medicare HMO

## 2022-11-30 ENCOUNTER — Inpatient Hospital Stay: Payer: Medicare HMO

## 2022-12-06 ENCOUNTER — Telehealth: Payer: Self-pay

## 2022-12-06 NOTE — Telephone Encounter (Signed)
Patient called in - DOB/Pharmacy verified - stated she stopped Arnuity Ellipta 200 mcg/act on Friday, 12/03/22 due to cough a lot more. Patient stated she had stopped using Arnuity, her coughing has eased up.  Patient advised message would be forwarded to provider for next step - Fluticasone may be possible for her to go back to due the try/fail with Arnuity.  Patient verbalized understanding, no further questions.

## 2022-12-07 ENCOUNTER — Inpatient Hospital Stay: Payer: Medicare HMO

## 2022-12-07 DIAGNOSIS — E538 Deficiency of other specified B group vitamins: Secondary | ICD-10-CM | POA: Diagnosis not present

## 2022-12-07 DIAGNOSIS — R634 Abnormal weight loss: Secondary | ICD-10-CM | POA: Diagnosis not present

## 2022-12-07 DIAGNOSIS — Z5111 Encounter for antineoplastic chemotherapy: Secondary | ICD-10-CM | POA: Diagnosis not present

## 2022-12-07 DIAGNOSIS — C9310 Chronic myelomonocytic leukemia not having achieved remission: Secondary | ICD-10-CM | POA: Diagnosis not present

## 2022-12-07 DIAGNOSIS — D539 Nutritional anemia, unspecified: Secondary | ICD-10-CM | POA: Diagnosis not present

## 2022-12-07 DIAGNOSIS — D696 Thrombocytopenia, unspecified: Secondary | ICD-10-CM | POA: Diagnosis not present

## 2022-12-07 DIAGNOSIS — J45909 Unspecified asthma, uncomplicated: Secondary | ICD-10-CM | POA: Diagnosis not present

## 2022-12-07 DIAGNOSIS — R7402 Elevation of levels of lactic acid dehydrogenase (LDH): Secondary | ICD-10-CM | POA: Diagnosis not present

## 2022-12-07 DIAGNOSIS — K5900 Constipation, unspecified: Secondary | ICD-10-CM | POA: Diagnosis not present

## 2022-12-07 LAB — CBC WITH DIFFERENTIAL/PLATELET
Abs Immature Granulocytes: 0.1 10*3/uL — ABNORMAL HIGH (ref 0.00–0.07)
Band Neutrophils: 2 %
Basophils Absolute: 0 10*3/uL (ref 0.0–0.1)
Basophils Relative: 1 %
Eosinophils Absolute: 0.2 10*3/uL (ref 0.0–0.5)
Eosinophils Relative: 6 %
HCT: 27.2 % — ABNORMAL LOW (ref 36.0–46.0)
Hemoglobin: 8.5 g/dL — ABNORMAL LOW (ref 12.0–15.0)
Lymphocytes Relative: 21 %
Lymphs Abs: 0.6 10*3/uL — ABNORMAL LOW (ref 0.7–4.0)
MCH: 30 pg (ref 26.0–34.0)
MCHC: 31.3 g/dL (ref 30.0–36.0)
MCV: 96.1 fL (ref 80.0–100.0)
Metamyelocytes Relative: 3 %
Monocytes Absolute: 0.2 10*3/uL (ref 0.1–1.0)
Monocytes Relative: 8 %
Neutro Abs: 1.6 10*3/uL — ABNORMAL LOW (ref 1.7–7.7)
Neutrophils Relative %: 59 %
Platelets: 115 10*3/uL — ABNORMAL LOW (ref 150–400)
RBC: 2.83 MIL/uL — ABNORMAL LOW (ref 3.87–5.11)
RDW: 19.9 % — ABNORMAL HIGH (ref 11.5–15.5)
WBC: 2.7 10*3/uL — ABNORMAL LOW (ref 4.0–10.5)
nRBC: 0.8 % — ABNORMAL HIGH (ref 0.0–0.2)
nRBC: 1 /100 WBC — ABNORMAL HIGH

## 2022-12-07 LAB — COMPREHENSIVE METABOLIC PANEL
ALT: 14 U/L (ref 0–44)
AST: 22 U/L (ref 15–41)
Albumin: 4.2 g/dL (ref 3.5–5.0)
Alkaline Phosphatase: 66 U/L (ref 38–126)
Anion gap: 7 (ref 5–15)
BUN: 15 mg/dL (ref 8–23)
CO2: 26 mmol/L (ref 22–32)
Calcium: 9.2 mg/dL (ref 8.9–10.3)
Chloride: 100 mmol/L (ref 98–111)
Creatinine, Ser: 0.86 mg/dL (ref 0.44–1.00)
GFR, Estimated: >60 mL/min (ref >60)
Glucose, Bld: 91 mg/dL (ref 70–99)
Potassium: 4.1 mmol/L (ref 3.5–5.1)
Sodium: 133 mmol/L — ABNORMAL LOW (ref 135–145)
Total Bilirubin: 1.2 mg/dL (ref 0.3–1.2)
Total Protein: 7.1 g/dL (ref 6.5–8.1)

## 2022-12-07 LAB — MAGNESIUM: Magnesium: 2 mg/dL (ref 1.7–2.4)

## 2022-12-07 MED ORDER — SODIUM CHLORIDE 0.9% FLUSH
10.0000 mL | Freq: Once | INTRAVENOUS | Status: AC
  Administered 2022-12-07: 10 mL via INTRAVENOUS

## 2022-12-07 MED ORDER — HEPARIN SOD (PORK) LOCK FLUSH 100 UNIT/ML IV SOLN
500.0000 [IU] | Freq: Once | INTRAVENOUS | Status: AC
  Administered 2022-12-07: 500 [IU] via INTRAVENOUS

## 2022-12-07 NOTE — Progress Notes (Signed)
Patients port flushed without difficulty.  Good blood return noted with no bruising or swelling noted at site.  Band aid applied.  VSS with discharge and left in satisfactory condition with no s/s of distress noted.   

## 2022-12-07 NOTE — Telephone Encounter (Signed)
Called and spoke with the patient and advised in regards to holding the Arnuity and just continuing the [facility]. Patient verbalized understanding. She will call back if she needs anything further.

## 2022-12-15 ENCOUNTER — Ambulatory Visit (INDEPENDENT_AMBULATORY_CARE_PROVIDER_SITE_OTHER): Payer: Medicare HMO

## 2022-12-15 DIAGNOSIS — J4540 Moderate persistent asthma, uncomplicated: Secondary | ICD-10-CM

## 2022-12-15 DIAGNOSIS — J4550 Severe persistent asthma, uncomplicated: Secondary | ICD-10-CM

## 2022-12-17 ENCOUNTER — Other Ambulatory Visit: Payer: Self-pay

## 2022-12-17 ENCOUNTER — Ambulatory Visit: Payer: Medicare HMO | Admitting: Podiatry

## 2022-12-17 DIAGNOSIS — M79675 Pain in left toe(s): Secondary | ICD-10-CM

## 2022-12-17 DIAGNOSIS — B351 Tinea unguium: Secondary | ICD-10-CM

## 2022-12-17 DIAGNOSIS — M79674 Pain in right toe(s): Secondary | ICD-10-CM

## 2022-12-17 DIAGNOSIS — C9310 Chronic myelomonocytic leukemia not having achieved remission: Secondary | ICD-10-CM

## 2022-12-17 MED ORDER — CICLOPIROX 8 % EX SOLN: Freq: Every day | CUTANEOUS | 2 refills | Status: DC

## 2022-12-17 NOTE — Patient Instructions (Signed)
Ciclopirox Topical Solution What is this medication? CICLOPIROX (sye kloe PEER ox) treats fungal infections of the nails. It belongs to a group of medications called antifungals. It will not treat infections caused by bacteria or viruses. This medicine may be used for other purposes; ask your health care provider or pharmacist if you have questions. COMMON BRAND NAME(S): Ciclodan Nail Solution, CNL8, Myochrysine, Penlac What should I tell my care team before I take this medication? They need to know if you have any of these conditions: Diabetes (high blood sugar) Immune system problems Organ transplant Receiving steroid inhalers, cream, or lotion Seizures Tingling of the fingers or toes or other nerve disorder An unusual or allergic reaction to ciclopirox, other medications, foods, dyes, or preservatives Pregnant or trying to get pregnant Breast-feeding How should I use this medication? This medication is for external use only. Do not take by mouth. Wash your hands before and after use. If you are treating your hands, only wash your hands before use. Do not get it in your eyes. If you do, rinse your eyes with plenty of cool tap water. Use it as directed on the prescription label at the same time every day. Do not use it more often than directed. Use the medication for the full course as directed by your care team, even if you think you are better. Do not stop using it unless your care team tells you to stop it early. Apply a thin film of the medication to the affected area. Talk to your care team about the use of this medication in children. While it may be prescribed for children as young as 12 years for selected conditions, precautions do apply. Overdosage: If you think you have taken too much of this medicine contact a poison control center or emergency room at once. NOTE: This medicine is only for you. Do not share this medicine with others. What if I miss a dose? If you miss a dose, use  it as soon as you can. If it is almost time for your next dose, use only that dose. Do not use double or extra doses. What may interact with this medication? Interactions are not expected. Do not use any other skin products without telling your care team. This list may not describe all possible interactions. Give your health care provider a list of all the medicines, herbs, non-prescription drugs, or dietary supplements you use. Also tell them if you smoke, drink alcohol, or use illegal drugs. Some items may interact with your medicine. What should I watch for while using this medication? Visit your care team for regular checks on your progress. It may be some time before you see the benefit from this medication. Do not use nail polish or other nail cosmetic products on the treated nails. Removal of the unattached, infected nail by your care team is needed with use of this medication. If you have diabetes or numbness in your fingers or toes, talk to your care team about proper nail care. What side effects may I notice from receiving this medication? Side effects that you should report to your care team as soon as possible: Allergic reactions--skin rash, itching, hives, swelling of the face, lips, tongue, or throat Burning, itching, crusting, or peeling of treated skin Side effects that usually do not require medical attention (report to your care team if they continue or are bothersome): Change in nail shape, thickness, or color Mild skin irritation, redness, or dryness This list may not describe all possible  side effects. Call your doctor for medical advice about side effects. You may report side effects to FDA at 1-800-FDA-1088. Where should I keep my medication? Keep out of the reach of children and pets. Store at room temperature between 20 and 25 degrees C (68 and 77 degrees F). This medication is flammable. Avoid exposure to heat, fire, flame, and smoking. Get rid of medications that are no  longer needed or have expired: Take the medication to a medication take-back program. Check with your pharmacy or law enforcement to find a location. If you cannot return the medication, check the label or package insert to see if the medication should be thrown out in the garbage or flushed down the toilet. If you are not sure, ask your care team. If it is safe to put in the trash, take the medication out of the container. Mix the medication with cat litter, dirt, coffee grounds, or other unwanted substance. Seal the mixture in a bag or container. Put it in the trash. NOTE: This sheet is a summary. It may not cover all possible information. If you have questions about this medicine, talk to your doctor, pharmacist, or health care provider.  2024 Elsevier/Gold Standard (2021-11-02 00:00:00)

## 2022-12-20 ENCOUNTER — Inpatient Hospital Stay: Payer: Medicare HMO

## 2022-12-20 ENCOUNTER — Inpatient Hospital Stay (HOSPITAL_BASED_OUTPATIENT_CLINIC_OR_DEPARTMENT_OTHER): Payer: Medicare HMO | Admitting: Hematology

## 2022-12-20 ENCOUNTER — Inpatient Hospital Stay: Payer: Medicare HMO | Attending: Hematology

## 2022-12-20 VITALS — BP 105/71 | HR 62 | Temp 97.1°F | Resp 17

## 2022-12-20 DIAGNOSIS — Z5111 Encounter for antineoplastic chemotherapy: Secondary | ICD-10-CM | POA: Diagnosis not present

## 2022-12-20 DIAGNOSIS — E538 Deficiency of other specified B group vitamins: Secondary | ICD-10-CM | POA: Diagnosis not present

## 2022-12-20 DIAGNOSIS — J45909 Unspecified asthma, uncomplicated: Secondary | ICD-10-CM | POA: Insufficient documentation

## 2022-12-20 DIAGNOSIS — R634 Abnormal weight loss: Secondary | ICD-10-CM | POA: Insufficient documentation

## 2022-12-20 DIAGNOSIS — K219 Gastro-esophageal reflux disease without esophagitis: Secondary | ICD-10-CM | POA: Diagnosis not present

## 2022-12-20 DIAGNOSIS — Z95828 Presence of other vascular implants and grafts: Secondary | ICD-10-CM | POA: Diagnosis not present

## 2022-12-20 DIAGNOSIS — Z9851 Tubal ligation status: Secondary | ICD-10-CM | POA: Insufficient documentation

## 2022-12-20 DIAGNOSIS — K5900 Constipation, unspecified: Secondary | ICD-10-CM | POA: Diagnosis not present

## 2022-12-20 DIAGNOSIS — C9310 Chronic myelomonocytic leukemia not having achieved remission: Secondary | ICD-10-CM

## 2022-12-20 DIAGNOSIS — R7402 Elevation of levels of lactic acid dehydrogenase (LDH): Secondary | ICD-10-CM | POA: Diagnosis not present

## 2022-12-20 DIAGNOSIS — Z882 Allergy status to sulfonamides status: Secondary | ICD-10-CM | POA: Diagnosis not present

## 2022-12-20 DIAGNOSIS — D696 Thrombocytopenia, unspecified: Secondary | ICD-10-CM | POA: Insufficient documentation

## 2022-12-20 DIAGNOSIS — Z79899 Other long term (current) drug therapy: Secondary | ICD-10-CM | POA: Diagnosis not present

## 2022-12-20 DIAGNOSIS — D539 Nutritional anemia, unspecified: Secondary | ICD-10-CM | POA: Diagnosis not present

## 2022-12-20 DIAGNOSIS — Z7951 Long term (current) use of inhaled steroids: Secondary | ICD-10-CM | POA: Diagnosis not present

## 2022-12-20 LAB — COMPREHENSIVE METABOLIC PANEL
ALT: 11 U/L (ref 0–44)
AST: 28 U/L (ref 15–41)
Albumin: 3.9 g/dL (ref 3.5–5.0)
Alkaline Phosphatase: 56 U/L (ref 38–126)
Anion gap: 6 (ref 5–15)
BUN: 11 mg/dL (ref 8–23)
CO2: 26 mmol/L (ref 22–32)
Calcium: 8.9 mg/dL (ref 8.9–10.3)
Chloride: 103 mmol/L (ref 98–111)
Creatinine, Ser: 0.75 mg/dL (ref 0.44–1.00)
GFR, Estimated: >60 mL/min (ref >60)
Glucose, Bld: 94 mg/dL (ref 70–99)
Potassium: 3.8 mmol/L (ref 3.5–5.1)
Sodium: 135 mmol/L (ref 135–145)
Total Bilirubin: 1.2 mg/dL (ref 0.3–1.2)
Total Protein: 6.8 g/dL (ref 6.5–8.1)

## 2022-12-20 LAB — CBC WITH DIFFERENTIAL/PLATELET
Abs Immature Granulocytes: 0.2 10*3/uL — ABNORMAL HIGH (ref 0.00–0.07)
Band Neutrophils: 3 %
Basophils Absolute: 0 10*3/uL (ref 0.0–0.1)
Basophils Relative: 0 %
Eosinophils Absolute: 0 10*3/uL (ref 0.0–0.5)
Eosinophils Relative: 0 %
HCT: 27.8 % — ABNORMAL LOW (ref 36.0–46.0)
Hemoglobin: 8.7 g/dL — ABNORMAL LOW (ref 12.0–15.0)
Lymphocytes Relative: 21 %
Lymphs Abs: 0.7 10*3/uL (ref 0.7–4.0)
MCH: 30.4 pg (ref 26.0–34.0)
MCHC: 31.3 g/dL (ref 30.0–36.0)
MCV: 97.2 fL (ref 80.0–100.0)
Metamyelocytes Relative: 4 %
Monocytes Absolute: 0.2 10*3/uL (ref 0.1–1.0)
Monocytes Relative: 5 %
Myelocytes: 1 %
Neutro Abs: 2.3 10*3/uL (ref 1.7–7.7)
Neutrophils Relative %: 66 %
Platelets: 178 10*3/uL (ref 150–400)
RBC: 2.86 MIL/uL — ABNORMAL LOW (ref 3.87–5.11)
RDW: 21.1 % — ABNORMAL HIGH (ref 11.5–15.5)
WBC: 3.4 10*3/uL — ABNORMAL LOW (ref 4.0–10.5)
nRBC: 1 /100 WBC — ABNORMAL HIGH
nRBC: 1.2 % — ABNORMAL HIGH (ref 0.0–0.2)

## 2022-12-20 LAB — MAGNESIUM: Magnesium: 1.9 mg/dL (ref 1.7–2.4)

## 2022-12-20 LAB — LACTATE DEHYDROGENASE: LDH: 842 U/L — ABNORMAL HIGH (ref 98–192)

## 2022-12-20 MED ORDER — SODIUM CHLORIDE 0.9% FLUSH
10.0000 mL | Freq: Once | INTRAVENOUS | Status: AC
  Administered 2022-12-20: 10 mL via INTRAVENOUS

## 2022-12-20 MED ORDER — PALONOSETRON HCL INJECTION 0.25 MG/5ML
0.2500 mg | Freq: Once | INTRAVENOUS | Status: AC
  Administered 2022-12-20: 0.25 mg via INTRAVENOUS
  Filled 2022-12-20: qty 5

## 2022-12-20 MED ORDER — SODIUM CHLORIDE 0.9 % IV SOLN: Freq: Once | INTRAVENOUS | Status: AC

## 2022-12-20 MED ORDER — HEPARIN SOD (PORK) LOCK FLUSH 100 UNIT/ML IV SOLN
500.0000 [IU] | Freq: Once | INTRAVENOUS | Status: AC
  Administered 2022-12-20: 500 [IU] via INTRAVENOUS

## 2022-12-20 MED ORDER — SODIUM CHLORIDE 0.9 % IV SOLN
75.0000 mg/m2 | Freq: Once | INTRAVENOUS | Status: AC
  Administered 2022-12-20: 126 mg via INTRAVENOUS
  Filled 2022-12-20: qty 12.6

## 2022-12-20 NOTE — Progress Notes (Signed)
Patient has been examined by Dr. Katragadda. Vital signs and labs have been reviewed by MD - ANC, Creatinine, LFTs, hemoglobin, and platelets are within treatment parameters per M.D. - pt may proceed with treatment.  Primary RN and pharmacy notified.  

## 2022-12-20 NOTE — Patient Instructions (Signed)
[facility]  Discharge Instructions: Thank you for choosing [facility] to provide your oncology and hematology care.  If you have a lab appointment with the Cancer Center - please note that after April 8th, 2024, all labs will be drawn in the cancer center.  You do not have to check in or register with the main entrance as you have in the past but will complete your check-in in the cancer center.  Wear comfortable clothing and clothing appropriate for easy access to any Portacath or PICC line.   We strive to give you quality time with your provider. You may need to reschedule your appointment if you arrive late (15 or more minutes).  Arriving late affects you and other patients whose appointments are after yours.  Also, if you miss three or more appointments without notifying the office, you may be dismissed from the clinic at the provider's discretion.      For prescription refill requests, have your pharmacy contact our office and allow 72 hours for refills to be completed.    Today you received the following chemotherapy and/or immunotherapy agents Vidaza.  Azacitidine Injection What is this medication? AZACITIDINE (ay za SITE i deen) treats blood and bone marrow cancers. It works by slowing down the growth of cancer cells. This medicine may be used for other purposes; ask your health care provider or pharmacist if you have questions. COMMON BRAND NAME(S): Vidaza What should I tell my care team before I take this medication? They need to know if you have any of these conditions: Kidney disease Liver disease Low blood cell levels, such as low white cells, platelets, or red blood cells Low levels of albumin in the blood Low levels of bicarbonate in the blood An unusual or allergic reaction to azacitidine, mannitol, other medications, foods, dyes, or preservatives If you or your partner are pregnant or trying to get pregnant Breast-feeding How should I  use this medication? This medication is injected into a vein or under the skin. It is given by your care team in a hospital or clinic setting. Talk to your care team about the use of this medication in children. While it may be prescribed for children as young as 1 month for selected conditions, precautions do apply. Overdosage: If you think you have taken too much of this medicine contact a poison control center or emergency room at once. NOTE: This medicine is only for you. Do not share this medicine with others. What if I miss a dose? Keep appointments for follow-up doses. It is important not to miss your dose. Call your care team if you are unable to keep an appointment. What may interact with this medication? Interactions are not expected. This list may not describe all possible interactions. Give your health care provider a list of all the medicines, herbs, non-prescription drugs, or dietary supplements you use. Also tell them if you smoke, drink alcohol, or use illegal drugs. Some items may interact with your medicine. What should I watch for while using this medication? Your condition will be monitored carefully while you are receiving this medication. You may need blood work while taking this medication. This medication may make you feel generally unwell. This is not uncommon as chemotherapy can affect healthy cells as well as cancer cells. Report any side effects. Continue your course of treatment even though you feel ill unless your care team tells you to stop. Other product types may be available that contain the   medication azacitidine. The injection and oral products should not be used in place of one another. Talk to your care team if you have questions. This medication can cause serious side effects. To reduce the risk, your care team may give you other medications to take before receiving this one. Be sure to follow the directions from your care team. This medication may increase your  risk of getting an infection. Call your care team for advice if you get a fever, chills, sore throat, or other symptoms of a cold or flu. Do not treat yourself. Try to avoid being around people who are sick. Avoid taking medications that contain aspirin, acetaminophen, ibuprofen, naproxen, or ketoprofen unless instructed by your care team. These medications may hide a fever. This medication may increase your risk to bruise or bleed. Call your care team if you notice any unusual bleeding. Be careful brushing or flossing your teeth or using a toothpick because you may get an infection or bleed more easily. If you have any dental work done, tell your dentist you are receiving this medication. Talk to your care team if you or your partner may be pregnant. Serious birth defects can occur if you take this medication during pregnancy and for 6 months after the last dose. You will need a negative pregnancy test before starting this medication. Contraception is recommended while taking his medication and for 6 months after the last dose. Your care team can help you find the option that works for you. If your partner can get pregnant, use a condom during sex while taking this medication and for 3 months after the last dose. Do not breastfeed while taking this medication and for 1 week after the last dose. This medication may cause infertility. Talk to your care team if you are concerned about your fertility. What side effects may I notice from receiving this medication? Side effects that you should report to your care team as soon as possible: Allergic reactions--skin rash, itching, hives, swelling of the face, lips, tongue, or throat Infection--fever, chills, cough, sore throat, wounds that don't heal, pain or trouble when passing urine, general feeling of discomfort or being unwell Kidney injury--decrease in the amount of urine, swelling of the ankles, hands, or feet Liver injury--right upper belly pain, loss  of appetite, nausea, light-colored stool, dark yellow or Maurisa Tesmer urine, yellowing skin or eyes, unusual weakness or fatigue Low red blood cell level--unusual weakness or fatigue, dizziness, headache, trouble breathing Tumor lysis syndrome (TLS)--nausea, vomiting, diarrhea, decrease in the amount of urine, dark urine, unusual weakness or fatigue, confusion, muscle pain or cramps, fast or irregular heartbeat, joint pain Unusual bruising or bleeding Side effects that usually do not require medical attention (report to your care team if they continue or are bothersome): Constipation Diarrhea Nausea Pain, redness, or irritation at injection site Vomiting This list may not describe all possible side effects. Call your doctor for medical advice about side effects. You may report side effects to FDA at 1-800-FDA-1088. Where should I keep my medication? This medication is given in a hospital or clinic. It will not be stored at home. NOTE: This sheet is a summary. It may not cover all possible information. If you have questions about this medicine, talk to your doctor, pharmacist, or health care provider.  2024 Elsevier/Gold Standard (2021-11-19 00:00:00)        To help prevent nausea and vomiting after your treatment, we encourage you to take your nausea medication as directed.  BELOW ARE SYMPTOMS   THAT SHOULD BE REPORTED IMMEDIATELY: *FEVER GREATER THAN 100.4 F (38 C) OR HIGHER *CHILLS OR SWEATING *NAUSEA AND VOMITING THAT IS NOT CONTROLLED WITH YOUR NAUSEA MEDICATION *UNUSUAL SHORTNESS OF BREATH *UNUSUAL BRUISING OR BLEEDING *URINARY PROBLEMS (pain or burning when urinating, or frequent urination) *BOWEL PROBLEMS (unusual diarrhea, constipation, pain near the anus) TENDERNESS IN MOUTH AND THROAT WITH OR WITHOUT PRESENCE OF ULCERS (sore throat, sores in mouth, or a toothache) UNUSUAL RASH, SWELLING OR PAIN  UNUSUAL VAGINAL DISCHARGE OR ITCHING   Items with * indicate a potential emergency and  should be followed up as soon as possible or go to the Emergency Department if any problems should occur.  Please show the CHEMOTHERAPY ALERT CARD or IMMUNOTHERAPY ALERT CARD at check-in to the Emergency Department and triage nurse.  Should you have questions after your visit or need to cancel or reschedule your appointment, please contact [facility] [phone]  and follow the prompts.  Office hours are 8:00 a.m. to 4:30 p.m. Monday - Friday. Please note that voicemails left after 4:00 p.m. may not be returned until the following business day.  We are closed weekends and major holidays. You have access to a nurse at all times for urgent questions. Please call the main number to the clinic [phone] and follow the prompts.  For any non-urgent questions, you may also contact your provider using MyChart. We now offer e-Visits for anyone 18 and older to request care online for non-urgent symptoms. For details visit [email or web address].   Also download the MyChart app! Go to the app store, search "MyChart", open the app, select [facility], and log in with your MyChart username and password.   

## 2022-12-20 NOTE — Progress Notes (Signed)
Patient presents today for chemotherapy infusion. Patient is in satisfactory condition with no new complaints voiced.  Vital signs are stable.  Labs reviewed by Dr. Katragadda during the office visit and all labs are within treatment parameters.  We will proceed with treatment per MD orders.   Patient tolerated treatment well with no complaints voiced.  Patient left ambulatory in stable condition.  Vital signs stable at discharge.  Follow up as scheduled.       

## 2022-12-20 NOTE — Patient Instructions (Signed)
[facility] Discharge Instructions   You were seen and examined today by Dr. Katragadda.  He reviewed the results of your lab work which are normal/stable.   We will proceed with your treatment today.  Return as scheduled.    Thank you for choosing [facility] to provide your oncology and hematology care.  To afford each patient quality time with our provider, please arrive at least 15 minutes before your scheduled appointment time.   If you have a lab appointment with the Cancer Center please come in thru the Main Entrance and check in at the main information desk.  You need to re-schedule your appointment should you arrive 10 or more minutes late.  We strive to give you quality time with our providers, and arriving late affects you and other patients whose appointments are after yours.  Also, if you no show three or more times for appointments you may be dismissed from the clinic at the providers discretion.     Again, thank you for choosing [facility].  Our hope is that these requests will decrease the amount of time that you wait before being seen by our physicians.       _____________________________________________________________  Should you have questions after your visit to [facility], please contact our office at [phone] and follow the prompts.  Our office hours are 8:00 a.m. and 4:30 p.m. Monday - Friday.  Please note that voicemails left after 4:00 p.m. may not be returned until the following business day.  We are closed weekends and major holidays.  You do have access to a nurse 24-7, just call the main number to the clinic [phone] and do not press any options, hold on the line and a nurse will answer the phone.    For prescription refill requests, have your pharmacy contact our office and allow 72 hours.    Due to Covid, you will need to wear a mask upon entering  the hospital. If you do not have a mask, a mask will be given to you at the Main Entrance upon arrival. For doctor visits, patients may have 1 support person age 18 or older with them. For treatment visits, patients can not have anyone with them due to social distancing guidelines and our immunocompromised population.      

## 2022-12-20 NOTE — Progress Notes (Unsigned)
Subjective: Chief Complaint  Patient presents with   Nail Problem    Rm 11 Bilateral nail fungus. Pt states she feels like the fungus is spreading. Pt wants to know what she can use to help.     72 year old female presents the office today for concerns of fungus on her toes particularly right second total nail.  The right 2nd can get more sore due to the hammertoe.  All the nails are thickened elongated causing discomfort.  No swelling redness or any drainage.  No open lesions.  Objective: AAO x3, NAD DP/PT pulses palpable bilaterally, CRT less than 3 seconds Nails are hypertrophic, dystrophic and discolored with incurvation of the nail borders.  There is tenderness nails 1-5 bilaterally however the right second toe is the most symptomatic in particular the right second nail is hypertrophic and dystrophic and is tender with trimming the nail.  There is no edema, erythema or signs of infection. No open lesions or pre-ulcerative lesions.  No pain with calf compression, swelling, warmth, erythema  Assessment: Symptomatic onychomycosis, right second toenail pain  Plan: -All treatment options discussed with the patient including all alternatives, risks, complications.  -Sharply debrided the toenails and complications or bleeding x 10.  Continue topical antifungal.  Refill of ciclopirox. -Consider removal of the right second digit toenail.  Discussed urea nail gel to help with the thickening as well as well as routine debridements for now. -Patient encouraged to call the office with any questions, concerns, change in symptoms.   Louann Sjogren DPM

## 2022-12-20 NOTE — Progress Notes (Signed)
[facility] [address], [place] [address]    Clinic Day:  12/20/2022  Referring physician: Carylon Perches, MD  Patient Care Team: Carylon Perches, MD as PCP - General (Internal Medicine) Jena Gauss Gerrit Friends, MD as Attending Physician (Gastroenterology) Storm Frisk, MD as Consulting Physician (Pulmonary Disease) Doreatha Massed, MD as Medical Oncologist (Hematology)   ASSESSMENT & PLAN:   Assessment: 1.  Macrocytic anemia and thrombocytopenia: - Patient seen at the request of Dr. Ouida Sills for abnormal CBC. - 04/17/2022: WBC 15.8, Hb-9.7, PLT-128 - 03/24/2022: WBC-8.1 (N-43%, L-17%, M-19%, B-2%), Hb-9.6, MCV-99, PLT-115 - Smear review: 13% metamyelocytes, 2% myelocytes, 2% blasts,  - 10/19/2021: WBC-5.3 (N-61, L-21, M-10%), Hb-11, MCV-96, PLT-97 - BMBX (05/25/2022): Hypercellular bone marrow with myeloid hyperplasia with dysgranulopoiesis, erythroid hypoplasia and megakaryocytic hyperplasia with dyspoiesis.  Chromosome analysis was 43, XX.  Bone marrow blasts less than 5%.  Peripheral blood blasts less than 2%. - Mayo molecular model risk stratification: Intermediate 2 risk with at least 2 points (decreased hemoglobin less than 10, circulating immature cells).  Intermediate 2 risk with median OS 31 months. - Serum EPO level 41. - NGS: Positive for CBL, MPL, SRSF2, TET 2, RAD21 - PET scan (07/08/2022): Splenomegaly (volume 510 mm) with normal metabolic activity.  Uniform increase in marrow metabolic activity related to patient's anemia.  No lymphadenopathy. - BMBX (07/16/2022): Hypercellular bone marrow (95-100%) with myeloid hyperplasia, atypical monocytes, increased blasts (16% overall).  Atypical monocytosis present 23% of total events, expressing HLA-DR, CD38, CD4 dim, CD11C, CD13, CD14, CD36, CD64 with aberrant coexpression of CD56 and CD7. - Cycle 1 of azacitidine on 08/02/2022, cycle 2 on 08/30/2022.  Azacitidine decreased to 5 days starting cycle 5 on 11/22/2022 - BMBX  (09/21/2022): 3% blasts, hypercellular marrow with markedly increased dyspoietic megakaryocytes, increased fibrosis - BMBX (11/16/2022): Hypercellular marrow (70-80%) with marked GIST [name] (, myelofibrosis and 3% blasts on a very limited sample.   2.  Social/family history: - She lives at home by herself.  Son lives next door. - Does part-time work at [employer].  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  Non-smoker. - Maternal aunt had tumor in the breast, patient not certain if it is cancer.  2 maternal first cousins had lymphoma.  Mother had stomach cancer.    Plan: 1.  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 - No infections in the past 4 weeks. - Reviewed labs today: White count 3.4, ANC 2.3.  Platelet count 178 and hemoglobin 8.7.  CMP is within normal limits.  LDH elevated at 842. - Proceed with cycle 6 today with with azacitidine x 5 days. - She has an appointment to see Dr. Lowell Guitar on 01/11/2023.  RTC 4 weeks for follow-up. - She wants to have dental fillings and ear piercing done.  I have recommended that she do those procedures during week 3 after treatment cycle.   2.  Folate deficiency: - Continue folic acid 1 mg tablet daily.   3.  Weight loss: - She is no longer taking Megace.  Appetite is good.   4.  Constipation: - Continue Colace.  Use lactulose as needed.    No orders of the defined types were placed in this encounter.     I,Katie Daubenspeck,acting as a Neurosurgeon for Doreatha Massed, MD.,have documented all relevant documentation on the behalf of Doreatha Massed, MD,as directed by  Doreatha Massed, MD while in the presence  of Doreatha Massed, MD.   I, Doreatha Massed MD, have reviewed the above documentation for accuracy and completeness, and I agree with the above.   Doreatha Massed, MD   6/3/202412:20 PM  CHIEF COMPLAINT:   Diagnosis: CMML-2    Cancer  Staging  No matching staging information was found for the patient.   Prior Therapy: none  Current Therapy:  Azacitidine 70 mg/m x 7 days every 28 days    HISTORY OF PRESENT ILLNESS:   Oncology History  CMML (chronic myelomonocytic leukemia) (HCC)  06/03/2022 Initial Diagnosis   CMML (chronic myelomonocytic leukemia) (HCC)   08/02/2022 -  Chemotherapy   Patient is on Treatment Plan : MYELODYSPLASIA  Azacitidine IVPB D1-7 q28d        INTERVAL HISTORY:   [name] is a 72 y.o. female presenting to clinic today for follow up of CMML-2. She was last seen by me on 11/22/22.  Today, she states that she is doing well overall. Her appetite level is at 100%. Her energy level is at 100%.  PAST MEDICAL HISTORY:   Past Medical History: Past Medical History:  Diagnosis Date   Anxiety    Asthma    had 1 episode 1 year ago-no more problem   CHF (congestive heart failure) (HCC)    swelling of feet & ankles   CMML (chronic myelomonocytic leukemia) (HCC) 06/03/2022   Cough    Depression    OCD   GERD (gastroesophageal reflux disease)    severe   OCD (obsessive compulsive disorder)    Port-A-Cath in place 07/26/2022   Shortness of breath    with exertion   Yeast infection 04/09/2014    Surgical History: Past Surgical History:  Procedure Laterality Date   CARPAL TUNNEL RELEASE  03/22/2012   Procedure: CARPAL TUNNEL RELEASE;  Surgeon: Nicki Reaper, MD;  Location: [facility];  Service: Orthopedics;  Laterality: Right;  right carpal tunnel release   COLONOSCOPY  06/19/2012   Dr. Rourk:normal rectum and colon    ESOPHAGOGASTRODUODENOSCOPY N/A 12/26/2015   Dr. Jena Gauss: normal    IR IMAGING GUIDED PORT INSERTION  07/30/2022   TONSILLECTOMY     TUBAL LIGATION      Social History: Social History   Socioeconomic History   Marital status: Widowed    Spouse name: Not on file   Number of children: 1   Years of education: Not on file   Highest education level: Not on file   Occupational History   Occupation: retired  Tobacco Use   Smoking status: Never    Passive exposure: Yes   Smokeless tobacco: Never  Vaping Use   Vaping Use: Never used  Substance and Sexual Activity   Alcohol use: No   Drug use: No   Sexual activity: Yes    Birth control/protection: Surgical    Comment: tubal  Other Topics Concern   Not on file  Social History Narrative   Not on file   Social Determinants of Health   Financial Resource Strain: Not on file  Food Insecurity: Not on file  Transportation Needs: Not on file  Physical Activity: Not on file  Stress: Not on file  Social Connections: Not on file  Intimate Partner Violence: Not on file    Family History: Family History  Problem Relation Age of Onset   Asthma Mother    Macular degeneration Mother    Hypertension Mother    Stomach cancer Mother 36   Alzheimer's disease Father  Other Sister        blood clots   Diabetes Maternal Grandfather    Diabetes Paternal Grandfather    Other Son        enlarged spleen   Lymphoma Cousin        x2, both dx in their 69s   Colon cancer Neg Hx     Current Medications:  Current Outpatient Medications:    albuterol (VENTOLIN HFA) 108 (90 Base) MCG/ACT inhaler, INHALE 2 PUFFS BY MOUTH EVERY 6 HOURS AS NEEDED FOR SHORTNESS OF BREATH OR WHEEZING., Disp: 18 g, Rfl: 2   albuterol (VENTOLIN HFA) 108 (90 Base) MCG/ACT inhaler, Inhale 2 puffs into the lungs every 4 (four) hours as needed for wheezing or shortness of breath., Disp: 18 g, Rfl: 1   ARNUITY ELLIPTA 200 MCG/ACT AEPB, Inhale 1 puff into the lungs daily., Disp: 30 each, Rfl: 5   azaCITIDine 5 mg/2 mLs in lactated ringers infusion, Inject into the vein daily. Days 1-7 every 28 days, Disp: , Rfl:    azelastine (ASTELIN) 0.1 % nasal spray, SPRAY 1 TO 2 SPRAYS PER NOSTRIL TWICE DAILY., Disp: 30 mL, Rfl: 3   benzonatate (TESSALON) 200 MG capsule, Take 200 mg by mouth 3 (three) times daily as needed., Disp: , Rfl:     BREZTRI AEROSPHERE 160-9-4.8 MCG/ACT AERO, Inhale 2 puffs into the lungs 2 (two) times daily., Disp: 10.7 g, Rfl: 5   cetirizine (ZYRTEC) 5 MG tablet, Take 1 tablet (5 mg total) by mouth daily., Disp: 30 tablet, Rfl: 0   ciclopirox (PENLAC) 8 % solution, Apply topically at bedtime. Apply over nail and surrounding skin. Apply daily over previous coat. After seven (7) days, may remove with alcohol and continue cycle., Disp: 6.6 mL, Rfl: 2   EPINEPHrine 0.3 mg/0.3 mL IJ SOAJ injection, Inject 0.3 mg into the muscle as needed for anaphylaxis., Disp: 2 each, Rfl: 1   famotidine (PEPCID) 20 MG tablet, Take 1 tablet (20 mg total) by mouth 2 (two) times daily., Disp: 60 tablet, Rfl: 5   FLUoxetine (PROZAC) 20 MG capsule, Take 1 capsule by mouth daily., Disp: , Rfl:    fluticasone (FLONASE) 50 MCG/ACT nasal spray, Place 2 sprays into both nostrils 2 (two) times daily., Disp: 16 g, Rfl: 5   fluticasone (FLOVENT HFA) 110 MCG/ACT inhaler, Inhale two puffs twice daily to prevent cough or wheeze. Rinse mouth after use., Disp: 12 g, Rfl: 5   folic acid (FOLVITE) 1 MG tablet, TAKE ONE TABLET BY MOUTH ONCE DAILY, Disp: 30 tablet, Rfl: 5   gabapentin (NEURONTIN) 300 MG capsule, , Disp: , Rfl:    ipratropium (ATROVENT) 0.06 % nasal spray, USE 2 SPRAYS IN EACH NOSTRIL THREE TIMES A DAY AS NEEDED., Disp: 15 mL, Rfl: 5   lactulose (CHRONULAC) 10 GM/15ML solution, take 30ml BY MOUTH EVERY THREE HOURS UNTIL bowel movement; THEN take 30ml ONCE DAILY AS NEEDED FOR CONSTIPATION, Disp: 450 mL, Rfl: 2   lidocaine-prilocaine (EMLA) cream, Apply a quarter sized amount to port a cath site and cover with plastic wrap one hour prior to infusion appointments, Disp: 30 g, Rfl: 3   megestrol (MEGACE) 400 MG/10ML suspension, Take 10 mLs (400 mg total) by mouth 2 (two) times daily., Disp: 480 mL, Rfl: 2   montelukast (SINGULAIR) 10 MG tablet, Take 1 tablet (10 mg total) by mouth at bedtime., Disp: 30 tablet, Rfl: 5   NON FORMULARY,  [facility]  Antifungal (nail)-#1, Disp: , Rfl:    NON  FORMULARY, [facility]  Anti-fungal (nail)-#1, Disp: , Rfl:    pantoprazole (PROTONIX) 40 MG tablet, Take 1 tablet (40 mg total) by mouth daily., Disp: 30 tablet, Rfl: 5   prochlorperazine (COMPAZINE) 10 MG tablet, Take 1 tablet (10 mg total) by mouth every 6 (six) hours as needed for nausea or vomiting., Disp: 30 tablet, Rfl: 0   Respiratory Therapy Supplies (FLUTTER) DEVI, Use as directed, Disp: 1 each, Rfl: 3   SF 5000 PLUS 1.1 % CREA dental cream, , Disp: , Rfl:    Spacer/Aero-Holding Chambers (AEROCHAMBER PLUS WITH MASK) inhaler, 1 each by Other route See admin instructions. Use with inhaler as instructed., Disp: 1 each, Rfl: 1   traZODone (DESYREL) 50 MG tablet, TAKE ONE TABLET BY MOUTH AT BEDTIME, Disp: 30 tablet, Rfl: 1  Current Facility-Administered Medications:    tezepelumab-ekko (TEZSPIRE) 210 MG/1. syringe 210 mg, 210 mg, Subcutaneous, Q28 days, Marcelyn Bruins, MD, 210 mg at 12/15/22 1016   Allergies: Allergies  Allergen Reactions   Levonorgestrel-Ethinyl Estrad Cough   Sulfa Antibiotics Other (See Comments)    Unknown- pt unsure of reaction; believes it may be nausea   Sulfamethoxazole-Trimethoprim Other (See Comments)   Cephalosporins Other (See Comments)    Other Reaction: Toxicity    REVIEW OF SYSTEMS:   Review of Systems  Constitutional:  Negative for chills, fatigue and fever.  HENT:   Negative for lump/mass, mouth sores, nosebleeds, sore throat and trouble swallowing.   Eyes:  Negative for eye problems.  Respiratory:  Positive for cough. Negative for shortness of breath.   Cardiovascular:  Positive for palpitations. Negative for chest pain and leg swelling.  Gastrointestinal:  Negative for abdominal pain, constipation, diarrhea, nausea and vomiting.  Genitourinary:  Negative for bladder incontinence, difficulty urinating, dysuria, frequency, hematuria and nocturia.    Musculoskeletal:  Negative for arthralgias, back pain, flank pain, myalgias and neck pain.  Skin:  Negative for itching and rash.  Neurological:  Negative for dizziness, headaches and numbness.  Hematological:  Does not bruise/bleed easily.  Psychiatric/Behavioral:  Negative for depression, sleep disturbance and suicidal ideas. The patient is not nervous/anxious.   All other systems reviewed and are negative.    VITALS:   There were no vitals taken for this visit.  Wt Readings from Last 3 Encounters:  12/20/22 139 lb 6.4 oz (63.2 kg)  11/22/22 138 lb (62.6 kg)  11/05/22 139 lb (63 kg)    There is no height or weight on file to calculate BMI.  Performance status (ECOG): 1 - Symptomatic but completely ambulatory  PHYSICAL EXAM:   Physical Exam Vitals and nursing note reviewed. Exam conducted with a chaperone present.  Constitutional:      Appearance: Normal appearance.  Cardiovascular:     Rate and Rhythm: Normal rate and regular rhythm.     Pulses: Normal pulses.     Heart sounds: Normal heart sounds.  Pulmonary:     Effort: Pulmonary effort is normal.     Breath sounds: Normal breath sounds.  Abdominal:     Palpations: Abdomen is soft. There is no hepatomegaly, splenomegaly or mass.     Tenderness: There is no abdominal tenderness.  Musculoskeletal:     Right lower leg: No edema.     Left lower leg: No edema.  Lymphadenopathy:     Cervical: No cervical adenopathy.     Right cervical: No superficial, deep or posterior cervical adenopathy.    Left cervical: No superficial, deep or posterior cervical adenopathy.  Upper Body:     Right upper body: No supraclavicular or axillary adenopathy.     Left upper body: No supraclavicular or axillary adenopathy.  Neurological:     General: No focal deficit present.     Mental Status: She is alert and oriented to person, place, and time.  Psychiatric:        Mood and Affect: Mood normal.        Behavior: Behavior normal.      LABS:      Latest Ref Rng & Units 12/20/2022   10:48 AM 12/07/2022    2:37 PM 11/22/2022   11:09 AM  CBC  WBC 4.0 - 10.5 K/uL 3.4  2.7  3.0   Hemoglobin 12.0 - 15.0 g/dL 8.7  8.5  16.1   Hematocrit 36.0 - 46.0 % 27.8  27.2  32.6   Platelets 150 - 400 K/uL 178  115  237       Latest Ref Rng & Units 12/20/2022   10:48 AM 12/07/2022    2:37 PM 11/22/2022   11:09 AM  CMP  Glucose 70 - 99 mg/dL 94  91  096   BUN 8 - 23 mg/dL 11  15  12    Creatinine 0.44 - 1.00 mg/dL 0.45  4.09  8.11   Sodium 135 - 145 mmol/L 135  133  137   Potassium 3.5 - 5.1 mmol/L 3.8  4.1  4.0   Chloride 98 - 111 mmol/L 103  100  106   CO2 22 - 32 mmol/L 26  26  25    Calcium 8.9 - 10.3 mg/dL 8.9  9.2  9.3   Total Protein 6.5 - 8.1 g/dL 6.8  7.1  7.3   Total Bilirubin 0.3 - 1.2 mg/dL 1.2  1.2  1.3   Alkaline Phos 38 - 126 U/L 56  66  65   AST 15 - 41 U/L 28  22  31    ALT 0 - 44 U/L 11  14  14       No results found for: "CEA1", "CEA" / No results found for: "CEA1", "CEA" No results found for: "PSA1" No results found for: "CAN199" No results found for: "CAN125"  Lab Results  Component Value Date   TOTALPROTELP 6.3 04/26/2022   ALBUMINELP 3.8 04/26/2022   A1GS 0.3 04/26/2022   A2GS 0.5 04/26/2022   BETS 0.8 04/26/2022   GAMS 0.9 04/26/2022   MSPIKE Not Observed 04/26/2022   SPEI Comment 04/26/2022   Lab Results  Component Value Date   TIBC 343 04/26/2022   FERRITIN 114 04/26/2022   IRONPCTSAT 25 04/26/2022   Lab Results  Component Value Date   LDH 842 (H) 12/20/2022   LDH 710 (H) 11/22/2022   LDH 531 (H) 10/25/2022     STUDIES:   No results found.

## 2022-12-21 ENCOUNTER — Inpatient Hospital Stay: Payer: Medicare HMO

## 2022-12-21 VITALS — BP 106/59 | HR 77 | Temp 97.7°F | Resp 18

## 2022-12-21 DIAGNOSIS — K219 Gastro-esophageal reflux disease without esophagitis: Secondary | ICD-10-CM | POA: Diagnosis not present

## 2022-12-21 DIAGNOSIS — C9310 Chronic myelomonocytic leukemia not having achieved remission: Secondary | ICD-10-CM

## 2022-12-21 DIAGNOSIS — E538 Deficiency of other specified B group vitamins: Secondary | ICD-10-CM | POA: Diagnosis not present

## 2022-12-21 DIAGNOSIS — D539 Nutritional anemia, unspecified: Secondary | ICD-10-CM | POA: Diagnosis not present

## 2022-12-21 DIAGNOSIS — R7402 Elevation of levels of lactic acid dehydrogenase (LDH): Secondary | ICD-10-CM | POA: Diagnosis not present

## 2022-12-21 DIAGNOSIS — D696 Thrombocytopenia, unspecified: Secondary | ICD-10-CM | POA: Diagnosis not present

## 2022-12-21 DIAGNOSIS — Z95828 Presence of other vascular implants and grafts: Secondary | ICD-10-CM

## 2022-12-21 DIAGNOSIS — K5900 Constipation, unspecified: Secondary | ICD-10-CM | POA: Diagnosis not present

## 2022-12-21 DIAGNOSIS — Z5111 Encounter for antineoplastic chemotherapy: Secondary | ICD-10-CM | POA: Diagnosis not present

## 2022-12-21 DIAGNOSIS — R634 Abnormal weight loss: Secondary | ICD-10-CM | POA: Diagnosis not present

## 2022-12-21 MED ORDER — SODIUM CHLORIDE 0.9% FLUSH
10.0000 mL | INTRAVENOUS | Status: DC | PRN
  Administered 2022-12-21: 10 mL via INTRAVENOUS

## 2022-12-21 MED ORDER — SODIUM CHLORIDE 0.9 % IV SOLN: Freq: Once | INTRAVENOUS | Status: AC

## 2022-12-21 MED ORDER — HEPARIN SOD (PORK) LOCK FLUSH 100 UNIT/ML IV SOLN
500.0000 [IU] | Freq: Once | INTRAVENOUS | Status: AC
  Administered 2022-12-21: 500 [IU] via INTRAVENOUS

## 2022-12-21 MED ORDER — SODIUM CHLORIDE 0.9 % IV SOLN
75.0000 mg/m2 | Freq: Once | INTRAVENOUS | Status: AC
  Administered 2022-12-21: 126 mg via INTRAVENOUS
  Filled 2022-12-21: qty 12.6

## 2022-12-21 NOTE — Patient Instructions (Signed)
[facility]  Discharge Instructions: Thank you for choosing [facility] to provide your oncology and hematology care.  If you have a lab appointment with the Cancer Center - please note that after April 8th, 2024, all labs will be drawn in the cancer center.  You do not have to check in or register with the main entrance as you have in the past but will complete your check-in in the cancer center.  Wear comfortable clothing and clothing appropriate for easy access to any Portacath or PICC line.   We strive to give you quality time with your provider. You may need to reschedule your appointment if you arrive late (15 or more minutes).  Arriving late affects you and other patients whose appointments are after yours.  Also, if you miss three or more appointments without notifying the office, you may be dismissed from the clinic at the provider's discretion.      For prescription refill requests, have your pharmacy contact our office and allow 72 hours for refills to be completed.    Today you received the following chemotherapy and/or immunotherapy agents D2 Vidaza   To help prevent nausea and vomiting after your treatment, we encourage you to take your nausea medication as directed.  Azacitidine Injection What is this medication? AZACITIDINE (ay za SITE i deen) treats blood and bone marrow cancers. It works by slowing down the growth of cancer cells. This medicine may be used for other purposes; ask your health care provider or pharmacist if you have questions. COMMON BRAND NAME(S): Vidaza What should I tell my care team before I take this medication? They need to know if you have any of these conditions: Kidney disease Liver disease Low blood cell levels, such as low white cells, platelets, or red blood cells Low levels of albumin in the blood Low levels of bicarbonate in the blood An unusual or allergic reaction to azacitidine, mannitol, other  medications, foods, dyes, or preservatives If you or your partner are pregnant or trying to get pregnant Breast-feeding How should I use this medication? This medication is injected into a vein or under the skin. It is given by your care team in a hospital or clinic setting. Talk to your care team about the use of this medication in children. While it may be prescribed for children as young as 1 month for selected conditions, precautions do apply. Overdosage: If you think you have taken too much of this medicine contact a poison control center or emergency room at once. NOTE: This medicine is only for you. Do not share this medicine with others. What if I miss a dose? Keep appointments for follow-up doses. It is important not to miss your dose. Call your care team if you are unable to keep an appointment. What may interact with this medication? Interactions are not expected. This list may not describe all possible interactions. Give your health care provider a list of all the medicines, herbs, non-prescription drugs, or dietary supplements you use. Also tell them if you smoke, drink alcohol, or use illegal drugs. Some items may interact with your medicine. What should I watch for while using this medication? Your condition will be monitored carefully while you are receiving this medication. You may need blood work while taking this medication. This medication may make you feel generally unwell. This is not uncommon as chemotherapy can affect healthy cells as well as cancer cells. Report any side effects. Continue your course of treatment  even though you feel ill unless your care team tells you to stop. Other product types may be available that contain the medication azacitidine. The injection and oral products should not be used in place of one another. Talk to your care team if you have questions. This medication can cause serious side effects. To reduce the risk, your care team may give you other  medications to take before receiving this one. Be sure to follow the directions from your care team. This medication may increase your risk of getting an infection. Call your care team for advice if you get a fever, chills, sore throat, or other symptoms of a cold or flu. Do not treat yourself. Try to avoid being around people who are sick. Avoid taking medications that contain aspirin, acetaminophen, ibuprofen, naproxen, or ketoprofen unless instructed by your care team. These medications may hide a fever. This medication may increase your risk to bruise or bleed. Call your care team if you notice any unusual bleeding. Be careful brushing or flossing your teeth or using a toothpick because you may get an infection or bleed more easily. If you have any dental work done, tell your dentist you are receiving this medication. Talk to your care team if you or your partner may be pregnant. Serious birth defects can occur if you take this medication during pregnancy and for 6 months after the last dose. You will need a negative pregnancy test before starting this medication. Contraception is recommended while taking his medication and for 6 months after the last dose. Your care team can help you find the option that works for you. If your partner can get pregnant, use a condom during sex while taking this medication and for 3 months after the last dose. Do not breastfeed while taking this medication and for 1 week after the last dose. This medication may cause infertility. Talk to your care team if you are concerned about your fertility. What side effects may I notice from receiving this medication? Side effects that you should report to your care team as soon as possible: Allergic reactions--skin rash, itching, hives, swelling of the face, lips, tongue, or throat Infection--fever, chills, cough, sore throat, wounds that don't heal, pain or trouble when passing urine, general feeling of discomfort or being  unwell Kidney injury--decrease in the amount of urine, swelling of the ankles, hands, or feet Liver injury--right upper belly pain, loss of appetite, nausea, light-colored stool, dark yellow or brown urine, yellowing skin or eyes, unusual weakness or fatigue Low red blood cell level--unusual weakness or fatigue, dizziness, headache, trouble breathing Tumor lysis syndrome (TLS)--nausea, vomiting, diarrhea, decrease in the amount of urine, dark urine, unusual weakness or fatigue, confusion, muscle pain or cramps, fast or irregular heartbeat, joint pain Unusual bruising or bleeding Side effects that usually do not require medical attention (report to your care team if they continue or are bothersome): Constipation Diarrhea Nausea Pain, redness, or irritation at injection site Vomiting This list may not describe all possible side effects. Call your doctor for medical advice about side effects. You may report side effects to FDA at 1-800-FDA-1088. Where should I keep my medication? This medication is given in a hospital or clinic. It will not be stored at home. NOTE: This sheet is a summary. It may not cover all possible information. If you have questions about this medicine, talk to your doctor, pharmacist, or health care provider.  2024 Elsevier/Gold Standard (2021-11-19 00:00:00)   BELOW ARE SYMPTOMS THAT SHOULD BE  REPORTED IMMEDIATELY: *FEVER GREATER THAN 100.4 F (38 C) OR HIGHER *CHILLS OR SWEATING *NAUSEA AND VOMITING THAT IS NOT CONTROLLED WITH YOUR NAUSEA MEDICATION *UNUSUAL SHORTNESS OF BREATH *UNUSUAL BRUISING OR BLEEDING *URINARY PROBLEMS (pain or burning when urinating, or frequent urination) *BOWEL PROBLEMS (unusual diarrhea, constipation, pain near the anus) TENDERNESS IN MOUTH AND THROAT WITH OR WITHOUT PRESENCE OF ULCERS (sore throat, sores in mouth, or a toothache) UNUSUAL RASH, SWELLING OR PAIN  UNUSUAL VAGINAL DISCHARGE OR ITCHING   Items with * indicate a potential  emergency and should be followed up as soon as possible or go to the Emergency Department if any problems should occur.  Please show the CHEMOTHERAPY ALERT CARD or IMMUNOTHERAPY ALERT CARD at check-in to the Emergency Department and triage nurse.  Should you have questions after your visit or need to cancel or reschedule your appointment, please contact [facility] [phone]  and follow the prompts.  Office hours are 8:00 a.m. to 4:30 p.m. Monday - Friday. Please note that voicemails left after 4:00 p.m. may not be returned until the following business day.  We are closed weekends and major holidays. You have access to a nurse at all times for urgent questions. Please call the main number to the clinic [phone] and follow the prompts.  For any non-urgent questions, you may also contact your provider using MyChart. We now offer e-Visits for anyone 50 and older to request care online for non-urgent symptoms. For details visit [email or web address].   Also download the MyChart app! Go to the app store, search "MyChart", open the app, select [place], and log in with your MyChart username and password.

## 2022-12-21 NOTE — Progress Notes (Signed)
Pt presents today for D2 Vidaza per provider's order. Vital signs stable and pt voiced no new complaints at this time.  D2 Vidaza given today per MD orders. Tolerated infusion without adverse affects. Vital signs stable. No complaints at this time. Discharged from clinic ambulatory in stable condition. Alert and oriented x 3. F/U with [facility] as scheduled.   

## 2022-12-22 ENCOUNTER — Inpatient Hospital Stay: Payer: Medicare HMO

## 2022-12-22 VITALS — BP 102/64 | HR 80 | Temp 99.3°F | Resp 18

## 2022-12-22 DIAGNOSIS — D696 Thrombocytopenia, unspecified: Secondary | ICD-10-CM | POA: Diagnosis not present

## 2022-12-22 DIAGNOSIS — C9310 Chronic myelomonocytic leukemia not having achieved remission: Secondary | ICD-10-CM

## 2022-12-22 DIAGNOSIS — E538 Deficiency of other specified B group vitamins: Secondary | ICD-10-CM | POA: Diagnosis not present

## 2022-12-22 DIAGNOSIS — K219 Gastro-esophageal reflux disease without esophagitis: Secondary | ICD-10-CM | POA: Diagnosis not present

## 2022-12-22 DIAGNOSIS — R7402 Elevation of levels of lactic acid dehydrogenase (LDH): Secondary | ICD-10-CM | POA: Diagnosis not present

## 2022-12-22 DIAGNOSIS — R634 Abnormal weight loss: Secondary | ICD-10-CM | POA: Diagnosis not present

## 2022-12-22 DIAGNOSIS — Z5111 Encounter for antineoplastic chemotherapy: Secondary | ICD-10-CM | POA: Diagnosis not present

## 2022-12-22 DIAGNOSIS — K5900 Constipation, unspecified: Secondary | ICD-10-CM | POA: Diagnosis not present

## 2022-12-22 DIAGNOSIS — D539 Nutritional anemia, unspecified: Secondary | ICD-10-CM | POA: Diagnosis not present

## 2022-12-22 DIAGNOSIS — Z95828 Presence of other vascular implants and grafts: Secondary | ICD-10-CM

## 2022-12-22 MED ORDER — PALONOSETRON HCL INJECTION 0.25 MG/5ML
0.2500 mg | Freq: Once | INTRAVENOUS | Status: AC
  Administered 2022-12-22: 0.25 mg via INTRAVENOUS
  Filled 2022-12-22: qty 5

## 2022-12-22 MED ORDER — SODIUM CHLORIDE 0.9 % IV SOLN
75.0000 mg/m2 | Freq: Once | INTRAVENOUS | Status: AC
  Administered 2022-12-22: 126 mg via INTRAVENOUS
  Filled 2022-12-22: qty 12.6

## 2022-12-22 MED ORDER — SODIUM CHLORIDE 0.9 % IV SOLN: Freq: Once | INTRAVENOUS | Status: AC

## 2022-12-22 MED ORDER — HEPARIN SOD (PORK) LOCK FLUSH 100 UNIT/ML IV SOLN
500.0000 [IU] | Freq: Once | INTRAVENOUS | Status: AC
  Administered 2022-12-22: 500 [IU] via INTRAVENOUS

## 2022-12-22 MED ORDER — SODIUM CHLORIDE 0.9% FLUSH
10.0000 mL | Freq: Once | INTRAVENOUS | Status: AC
  Administered 2022-12-22: 10 mL via INTRAVENOUS

## 2022-12-22 NOTE — Patient Instructions (Signed)
[facility] [place]  Discharge Instructions: Thank you for choosing [facility] to provide your oncology and hematology care.  If you have a lab appointment with the Cancer Center - please note that after April 8th, 2024, all labs will be drawn in the cancer center.  You do not have to check in or register with the main entrance as you have in the past but will complete your check-in in the cancer center.  Wear comfortable clothing and clothing appropriate for easy access to any Portacath or PICC line.   We strive to give you quality time with your provider. You may need to reschedule your appointment if you arrive late (15 or more minutes).  Arriving late affects you and other patients whose appointments are after yours.  Also, if you miss three or more appointments without notifying the office, you may be dismissed from the clinic at the provider's discretion.      For prescription refill requests, have your pharmacy contact our office and allow 72 hours for refills to be completed.    Today you received the following chemotherapy and/or immunotherapy agents vidaza   To help prevent nausea and vomiting after your treatment, we encourage you to take your nausea medication as directed.  BELOW ARE SYMPTOMS THAT SHOULD BE REPORTED IMMEDIATELY: *FEVER GREATER THAN 100.4 F (38 C) OR HIGHER *CHILLS OR SWEATING *NAUSEA AND VOMITING THAT IS NOT CONTROLLED WITH YOUR NAUSEA MEDICATION *UNUSUAL SHORTNESS OF BREATH *UNUSUAL BRUISING OR BLEEDING *URINARY PROBLEMS (pain or burning when urinating, or frequent urination) *BOWEL PROBLEMS (unusual diarrhea, constipation, pain near the anus) TENDERNESS IN MOUTH AND THROAT WITH OR WITHOUT PRESENCE OF ULCERS (sore throat, sores in mouth, or a toothache) UNUSUAL RASH, SWELLING OR PAIN  UNUSUAL VAGINAL DISCHARGE OR ITCHING   Items with * indicate a potential emergency and should be followed up as soon as possible or go to the  Emergency Department if any problems should occur.  Please show the CHEMOTHERAPY ALERT CARD or IMMUNOTHERAPY ALERT CARD at check-in to the Emergency Department and triage nurse.  Should you have questions after your visit or need to cancel or reschedule your appointment, please contact [facility] [place] [phone]  and follow the prompts.  Office hours are 8:00 a.m. to 4:30 p.m. Monday - Friday. Please note that voicemails left after 4:00 p.m. may not be returned until the following business day.  We are closed weekends and major holidays. You have access to a nurse at all times for urgent questions. Please call the main number to the clinic [phone] and follow the prompts.  For any non-urgent questions, you may also contact your provider using MyChart. We now offer e-Visits for anyone 18 and older to request care online for non-urgent symptoms. For details visit [email or web address].   Also download the MyChart app! Go to the app store, search "MyChart", open the app, select , and log in with your MyChart username and password.   

## 2022-12-22 NOTE — Progress Notes (Signed)
Treatment given per orders. Patient tolerated it well without problems. Vitals stable and discharged home from clinic ambulatory. Follow up as scheduled.  

## 2022-12-23 ENCOUNTER — Inpatient Hospital Stay: Payer: Medicare HMO

## 2022-12-23 VITALS — BP 99/82 | HR 77 | Temp 98.3°F | Resp 18

## 2022-12-23 DIAGNOSIS — K5900 Constipation, unspecified: Secondary | ICD-10-CM | POA: Diagnosis not present

## 2022-12-23 DIAGNOSIS — R7402 Elevation of levels of lactic acid dehydrogenase (LDH): Secondary | ICD-10-CM | POA: Diagnosis not present

## 2022-12-23 DIAGNOSIS — Z5111 Encounter for antineoplastic chemotherapy: Secondary | ICD-10-CM | POA: Diagnosis not present

## 2022-12-23 DIAGNOSIS — C9310 Chronic myelomonocytic leukemia not having achieved remission: Secondary | ICD-10-CM | POA: Diagnosis not present

## 2022-12-23 DIAGNOSIS — D539 Nutritional anemia, unspecified: Secondary | ICD-10-CM | POA: Diagnosis not present

## 2022-12-23 DIAGNOSIS — D696 Thrombocytopenia, unspecified: Secondary | ICD-10-CM | POA: Diagnosis not present

## 2022-12-23 DIAGNOSIS — Z95828 Presence of other vascular implants and grafts: Secondary | ICD-10-CM

## 2022-12-23 DIAGNOSIS — K219 Gastro-esophageal reflux disease without esophagitis: Secondary | ICD-10-CM | POA: Diagnosis not present

## 2022-12-23 DIAGNOSIS — R634 Abnormal weight loss: Secondary | ICD-10-CM | POA: Diagnosis not present

## 2022-12-23 DIAGNOSIS — E538 Deficiency of other specified B group vitamins: Secondary | ICD-10-CM | POA: Diagnosis not present

## 2022-12-23 MED ORDER — HEPARIN SOD (PORK) LOCK FLUSH 100 UNIT/ML IV SOLN
500.0000 [IU] | Freq: Once | INTRAVENOUS | Status: AC
  Administered 2022-12-23: 500 [IU] via INTRAVENOUS

## 2022-12-23 MED ORDER — SODIUM CHLORIDE 0.9 % IV SOLN: Freq: Once | INTRAVENOUS | Status: AC

## 2022-12-23 MED ORDER — SODIUM CHLORIDE 0.9% FLUSH
10.0000 mL | Freq: Once | INTRAVENOUS | Status: AC
  Administered 2022-12-23: 10 mL via INTRAVENOUS

## 2022-12-23 MED ORDER — SODIUM CHLORIDE 0.9 % IV SOLN
75.0000 mg/m2 | Freq: Once | INTRAVENOUS | Status: AC
  Administered 2022-12-23: 126 mg via INTRAVENOUS
  Filled 2022-12-23: qty 12.6

## 2022-12-23 NOTE — Patient Instructions (Signed)
[facility] [place]  Discharge Instructions: Thank you for choosing [facility] to provide your oncology and hematology care.  If you have a lab appointment with the Cancer Center - please note that after April 8th, 2024, all labs will be drawn in the cancer center.  You do not have to check in or register with the main entrance as you have in the past but will complete your check-in in the cancer center.  Wear comfortable clothing and clothing appropriate for easy access to any Portacath or PICC line.   We strive to give you quality time with your provider. You may need to reschedule your appointment if you arrive late (15 or more minutes).  Arriving late affects you and other patients whose appointments are after yours.  Also, if you miss three or more appointments without notifying the office, you may be dismissed from the clinic at the provider's discretion.      For prescription refill requests, have your pharmacy contact our office and allow 72 hours for refills to be completed.    Today you received the following chemotherapy and/or immunotherapy agents Vidaza.  Azacitidine Injection What is this medication? AZACITIDINE (ay za SITE i deen) treats blood and bone marrow cancers. It works by slowing down the growth of cancer cells. This medicine may be used for other purposes; ask your health care provider or pharmacist if you have questions. COMMON BRAND NAME(S): Vidaza What should I tell my care team before I take this medication? They need to know if you have any of these conditions: Kidney disease Liver disease Low blood cell levels, such as low white cells, platelets, or red blood cells Low levels of albumin in the blood Low levels of bicarbonate in the blood An unusual or allergic reaction to azacitidine, mannitol, other medications, foods, dyes, or preservatives If you or your partner are pregnant or trying to get pregnant Breast-feeding How should I  use this medication? This medication is injected into a vein or under the skin. It is given by your care team in a hospital or clinic setting. Talk to your care team about the use of this medication in children. While it may be prescribed for children as young as 1 month for selected conditions, precautions do apply. Overdosage: If you think you have taken too much of this medicine contact a poison control center or emergency room at once. NOTE: This medicine is only for you. Do not share this medicine with others. What if I miss a dose? Keep appointments for follow-up doses. It is important not to miss your dose. Call your care team if you are unable to keep an appointment. What may interact with this medication? Interactions are not expected. This list may not describe all possible interactions. Give your health care provider a list of all the medicines, herbs, non-prescription drugs, or dietary supplements you use. Also tell them if you smoke, drink alcohol, or use illegal drugs. Some items may interact with your medicine. What should I watch for while using this medication? Your condition will be monitored carefully while you are receiving this medication. You may need blood work while taking this medication. This medication may make you feel generally unwell. This is not uncommon as chemotherapy can affect healthy cells as well as cancer cells. Report any side effects. Continue your course of treatment even though you feel ill unless your care team tells you to stop. Other product types may be available that contain the   medication azacitidine. The injection and oral products should not be used in place of one another. Talk to your care team if you have questions. This medication can cause serious side effects. To reduce the risk, your care team may give you other medications to take before receiving this one. Be sure to follow the directions from your care team. This medication may increase your  risk of getting an infection. Call your care team for advice if you get a fever, chills, sore throat, or other symptoms of a cold or flu. Do not treat yourself. Try to avoid being around people who are sick. Avoid taking medications that contain aspirin, acetaminophen, ibuprofen, naproxen, or ketoprofen unless instructed by your care team. These medications may hide a fever. This medication may increase your risk to bruise or bleed. Call your care team if you notice any unusual bleeding. Be careful brushing or flossing your teeth or using a toothpick because you may get an infection or bleed more easily. If you have any dental work done, tell your dentist you are receiving this medication. Talk to your care team if you or your partner may be pregnant. Serious birth defects can occur if you take this medication during pregnancy and for 6 months after the last dose. You will need a negative pregnancy test before starting this medication. Contraception is recommended while taking his medication and for 6 months after the last dose. Your care team can help you find the option that works for you. If your partner can get pregnant, use a condom during sex while taking this medication and for 3 months after the last dose. Do not breastfeed while taking this medication and for 1 week after the last dose. This medication may cause infertility. Talk to your care team if you are concerned about your fertility. What side effects may I notice from receiving this medication? Side effects that you should report to your care team as soon as possible: Allergic reactions--skin rash, itching, hives, swelling of the face, lips, tongue, or throat Infection--fever, chills, cough, sore throat, wounds that don't heal, pain or trouble when passing urine, general feeling of discomfort or being unwell Kidney injury--decrease in the amount of urine, swelling of the ankles, hands, or feet Liver injury--right upper belly pain, loss  of appetite, nausea, light-colored stool, dark yellow or brown urine, yellowing skin or eyes, unusual weakness or fatigue Low red blood cell level--unusual weakness or fatigue, dizziness, headache, trouble breathing Tumor lysis syndrome (TLS)--nausea, vomiting, diarrhea, decrease in the amount of urine, dark urine, unusual weakness or fatigue, confusion, muscle pain or cramps, fast or irregular heartbeat, joint pain Unusual bruising or bleeding Side effects that usually do not require medical attention (report to your care team if they continue or are bothersome): Constipation Diarrhea Nausea Pain, redness, or irritation at injection site Vomiting This list may not describe all possible side effects. Call your doctor for medical advice about side effects. You may report side effects to FDA at 1-800-FDA-1088. Where should I keep my medication? This medication is given in a hospital or clinic. It will not be stored at home. NOTE: This sheet is a summary. It may not cover all possible information. If you have questions about this medicine, talk to your doctor, pharmacist, or health care provider.  2024 Elsevier/Gold Standard (2021-11-19 00:00:00)        To help prevent nausea and vomiting after your treatment, we encourage you to take your nausea medication as directed.  BELOW ARE SYMPTOMS   THAT SHOULD BE REPORTED IMMEDIATELY: *FEVER GREATER THAN 100.4 F (38 C) OR HIGHER *CHILLS OR SWEATING *NAUSEA AND VOMITING THAT IS NOT CONTROLLED WITH YOUR NAUSEA MEDICATION *UNUSUAL SHORTNESS OF BREATH *UNUSUAL BRUISING OR BLEEDING *URINARY PROBLEMS (pain or burning when urinating, or frequent urination) *BOWEL PROBLEMS (unusual diarrhea, constipation, pain near the anus) TENDERNESS IN MOUTH AND THROAT WITH OR WITHOUT PRESENCE OF ULCERS (sore throat, sores in mouth, or a toothache) UNUSUAL RASH, SWELLING OR PAIN  UNUSUAL VAGINAL DISCHARGE OR ITCHING   Items with * indicate a potential emergency and  should be followed up as soon as possible or go to the Emergency Department if any problems should occur.  Please show the CHEMOTHERAPY ALERT CARD or IMMUNOTHERAPY ALERT CARD at check-in to the Emergency Department and triage nurse.  Should you have questions after your visit or need to cancel or reschedule your appointment, please contact [facility] [place] [phone]  and follow the prompts.  Office hours are 8:00 a.m. to 4:30 p.m. Monday - Friday. Please note that voicemails left after 4:00 p.m. may not be returned until the following business day.  We are closed weekends and major holidays. You have access to a nurse at all times for urgent questions. Please call the main number to the clinic [phone] and follow the prompts.  For any non-urgent questions, you may also contact your provider using MyChart. We now offer e-Visits for anyone 18 and older to request care online for non-urgent symptoms. For details visit [email or web address].   Also download the MyChart app! Go to the app store, search "MyChart", open the app, select [place], and log in with your MyChart username and password.   

## 2022-12-23 NOTE — Progress Notes (Signed)
Patient presents today for chemotherapy infusion of Vidaza,day 4.  Patient is in satisfactory condition with no new complaints voiced.  Vital signs are stable.  Labs reviewed and all labs are within treatment parameters.  We will proceed with treatment per MD orders.    Patient tolerated treatment well with no complaints voiced.  Patient left ambulatory in stable condition.  Vital signs stable at discharge.  Follow up as scheduled.

## 2022-12-24 ENCOUNTER — Inpatient Hospital Stay: Payer: Medicare HMO

## 2022-12-24 VITALS — BP 108/65 | HR 78 | Temp 96.6°F | Resp 18

## 2022-12-24 DIAGNOSIS — Z95828 Presence of other vascular implants and grafts: Secondary | ICD-10-CM

## 2022-12-24 DIAGNOSIS — D696 Thrombocytopenia, unspecified: Secondary | ICD-10-CM | POA: Diagnosis not present

## 2022-12-24 DIAGNOSIS — Z5111 Encounter for antineoplastic chemotherapy: Secondary | ICD-10-CM | POA: Diagnosis not present

## 2022-12-24 DIAGNOSIS — C9310 Chronic myelomonocytic leukemia not having achieved remission: Secondary | ICD-10-CM

## 2022-12-24 DIAGNOSIS — K5900 Constipation, unspecified: Secondary | ICD-10-CM | POA: Diagnosis not present

## 2022-12-24 DIAGNOSIS — R634 Abnormal weight loss: Secondary | ICD-10-CM | POA: Diagnosis not present

## 2022-12-24 DIAGNOSIS — E538 Deficiency of other specified B group vitamins: Secondary | ICD-10-CM | POA: Diagnosis not present

## 2022-12-24 DIAGNOSIS — R7402 Elevation of levels of lactic acid dehydrogenase (LDH): Secondary | ICD-10-CM | POA: Diagnosis not present

## 2022-12-24 DIAGNOSIS — D539 Nutritional anemia, unspecified: Secondary | ICD-10-CM | POA: Diagnosis not present

## 2022-12-24 DIAGNOSIS — K219 Gastro-esophageal reflux disease without esophagitis: Secondary | ICD-10-CM | POA: Diagnosis not present

## 2022-12-24 MED ORDER — SODIUM CHLORIDE 0.9 % IV SOLN
75.0000 mg/m2 | Freq: Once | INTRAVENOUS | Status: AC
  Administered 2022-12-24: 126 mg via INTRAVENOUS
  Filled 2022-12-24: qty 12.6

## 2022-12-24 MED ORDER — SODIUM CHLORIDE 0.9% FLUSH
10.0000 mL | Freq: Once | INTRAVENOUS | Status: AC
  Administered 2022-12-24: 10 mL via INTRAVENOUS

## 2022-12-24 MED ORDER — HEPARIN SOD (PORK) LOCK FLUSH 100 UNIT/ML IV SOLN
500.0000 [IU] | Freq: Once | INTRAVENOUS | Status: AC
  Administered 2022-12-24: 500 [IU] via INTRAVENOUS

## 2022-12-24 MED ORDER — SODIUM CHLORIDE 0.9 % IV SOLN: Freq: Once | INTRAVENOUS | Status: AC

## 2022-12-24 MED ORDER — PALONOSETRON HCL INJECTION 0.25 MG/5ML
0.2500 mg | Freq: Once | INTRAVENOUS | Status: AC
  Administered 2022-12-24: 0.25 mg via INTRAVENOUS
  Filled 2022-12-24: qty 5

## 2022-12-24 NOTE — Progress Notes (Signed)
Patient tolerated chemotherapy with no complaints voiced.  Side effects with management reviewed with understanding verbalized.  Port site clean and dry with no bruising or swelling noted at site.  Good blood return noted before and after administration of chemotherapy.  Band aid applied.  Patient left in satisfactory condition with VSS and no s/s of distress noted.   

## 2022-12-24 NOTE — Patient Instructions (Signed)
[facility]  Discharge Instructions: Thank you for choosing [facility] to provide your oncology and hematology care.  If you have a lab appointment with the Cancer Center - please note that after April 8th, 2024, all labs will be drawn in the cancer center.  You do not have to check in or register with the main entrance as you have in the past but will complete your check-in in the cancer center.  Wear comfortable clothing and clothing appropriate for easy access to any Portacath or PICC line.   We strive to give you quality time with your provider. You may need to reschedule your appointment if you arrive late (15 or more minutes).  Arriving late affects you and other patients whose appointments are after yours.  Also, if you miss three or more appointments without notifying the office, you may be dismissed from the clinic at the provider's discretion.      For prescription refill requests, have your pharmacy contact our office and allow 72 hours for refills to be completed.  To help prevent nausea and vomiting after your treatment, we encourage you to take your nausea medication as directed.  BELOW ARE SYMPTOMS THAT SHOULD BE REPORTED IMMEDIATELY: *FEVER GREATER THAN 100.4 F (38 C) OR HIGHER *CHILLS OR SWEATING *NAUSEA AND VOMITING THAT IS NOT CONTROLLED WITH YOUR NAUSEA MEDICATION *UNUSUAL SHORTNESS OF BREATH *UNUSUAL BRUISING OR BLEEDING *URINARY PROBLEMS (pain or burning when urinating, or frequent urination) *BOWEL PROBLEMS (unusual diarrhea, constipation, pain near the anus) TENDERNESS IN MOUTH AND THROAT WITH OR WITHOUT PRESENCE OF ULCERS (sore throat, sores in mouth, or a toothache) UNUSUAL RASH, SWELLING OR PAIN  UNUSUAL VAGINAL DISCHARGE OR ITCHING   Items with * indicate a potential emergency and should be followed up as soon as possible or go to the Emergency Department if any problems should occur.  Please show the CHEMOTHERAPY ALERT CARD or  IMMUNOTHERAPY ALERT CARD at check-in to the Emergency Department and triage nurse.  Should you have questions after your visit or need to cancel or reschedule your appointment, please contact [facility] [phone]  and follow the prompts.  Office hours are 8:00 a.m. to 4:30 p.m. Monday - Friday. Please note that voicemails left after 4:00 p.m. may not be returned until the following business day.  We are closed weekends and major holidays. You have access to a nurse at all times for urgent questions. Please call the main number to the clinic [phone] and follow the prompts.  For any non-urgent questions, you may also contact your provider using MyChart. We now offer e-Visits for anyone 18 and older to request care online for non-urgent symptoms. For details visit [email or web address].   Also download the MyChart app! Go to the app store, search "MyChart", open the app, select [name], and log in with your MyChart username and password.   

## 2022-12-27 ENCOUNTER — Inpatient Hospital Stay: Payer: Medicare HMO

## 2022-12-28 ENCOUNTER — Inpatient Hospital Stay: Payer: Medicare HMO

## 2023-01-10 ENCOUNTER — Ambulatory Visit (INDEPENDENT_AMBULATORY_CARE_PROVIDER_SITE_OTHER): Payer: Medicare HMO

## 2023-01-10 DIAGNOSIS — J4550 Severe persistent asthma, uncomplicated: Secondary | ICD-10-CM

## 2023-01-11 ENCOUNTER — Other Ambulatory Visit: Payer: Self-pay

## 2023-01-11 DIAGNOSIS — C9200 Acute myeloblastic leukemia, not having achieved remission: Secondary | ICD-10-CM | POA: Diagnosis not present

## 2023-01-11 DIAGNOSIS — C9310 Chronic myelomonocytic leukemia not having achieved remission: Secondary | ICD-10-CM | POA: Diagnosis not present

## 2023-01-24 ENCOUNTER — Inpatient Hospital Stay: Payer: Medicare HMO | Attending: Hematology

## 2023-01-24 ENCOUNTER — Inpatient Hospital Stay: Payer: Medicare HMO

## 2023-01-24 ENCOUNTER — Encounter: Payer: Self-pay | Admitting: Hematology

## 2023-01-24 ENCOUNTER — Inpatient Hospital Stay: Payer: Medicare HMO | Admitting: Hematology

## 2023-01-24 VITALS — BP 101/66 | HR 74 | Temp 96.6°F | Resp 18

## 2023-01-24 DIAGNOSIS — R1012 Left upper quadrant pain: Secondary | ICD-10-CM | POA: Diagnosis not present

## 2023-01-24 DIAGNOSIS — R7402 Elevation of levels of lactic acid dehydrogenase (LDH): Secondary | ICD-10-CM | POA: Diagnosis not present

## 2023-01-24 DIAGNOSIS — F419 Anxiety disorder, unspecified: Secondary | ICD-10-CM | POA: Diagnosis not present

## 2023-01-24 DIAGNOSIS — Z818 Family history of other mental and behavioral disorders: Secondary | ICD-10-CM | POA: Insufficient documentation

## 2023-01-24 DIAGNOSIS — E538 Deficiency of other specified B group vitamins: Secondary | ICD-10-CM | POA: Insufficient documentation

## 2023-01-24 DIAGNOSIS — Z5111 Encounter for antineoplastic chemotherapy: Secondary | ICD-10-CM | POA: Insufficient documentation

## 2023-01-24 DIAGNOSIS — D696 Thrombocytopenia, unspecified: Secondary | ICD-10-CM | POA: Diagnosis not present

## 2023-01-24 DIAGNOSIS — K219 Gastro-esophageal reflux disease without esophagitis: Secondary | ICD-10-CM | POA: Diagnosis not present

## 2023-01-24 DIAGNOSIS — J45909 Unspecified asthma, uncomplicated: Secondary | ICD-10-CM | POA: Insufficient documentation

## 2023-01-24 DIAGNOSIS — Z833 Family history of diabetes mellitus: Secondary | ICD-10-CM | POA: Diagnosis not present

## 2023-01-24 DIAGNOSIS — D539 Nutritional anemia, unspecified: Secondary | ICD-10-CM | POA: Diagnosis not present

## 2023-01-24 DIAGNOSIS — Z825 Family history of asthma and other chronic lower respiratory diseases: Secondary | ICD-10-CM | POA: Insufficient documentation

## 2023-01-24 DIAGNOSIS — R002 Palpitations: Secondary | ICD-10-CM | POA: Diagnosis not present

## 2023-01-24 DIAGNOSIS — Z8249 Family history of ischemic heart disease and other diseases of the circulatory system: Secondary | ICD-10-CM | POA: Diagnosis not present

## 2023-01-24 DIAGNOSIS — C9310 Chronic myelomonocytic leukemia not having achieved remission: Secondary | ICD-10-CM | POA: Insufficient documentation

## 2023-01-24 DIAGNOSIS — Z95828 Presence of other vascular implants and grafts: Secondary | ICD-10-CM | POA: Diagnosis not present

## 2023-01-24 DIAGNOSIS — Z8379 Family history of other diseases of the digestive system: Secondary | ICD-10-CM | POA: Insufficient documentation

## 2023-01-24 DIAGNOSIS — R0602 Shortness of breath: Secondary | ICD-10-CM | POA: Diagnosis not present

## 2023-01-24 DIAGNOSIS — Z800 Family history of malignant neoplasm of digestive organs: Secondary | ICD-10-CM | POA: Insufficient documentation

## 2023-01-24 DIAGNOSIS — Z79899 Other long term (current) drug therapy: Secondary | ICD-10-CM | POA: Insufficient documentation

## 2023-01-24 DIAGNOSIS — Z83518 Family history of other specified eye disorder: Secondary | ICD-10-CM | POA: Insufficient documentation

## 2023-01-24 DIAGNOSIS — Z882 Allergy status to sulfonamides status: Secondary | ICD-10-CM | POA: Insufficient documentation

## 2023-01-24 DIAGNOSIS — K5900 Constipation, unspecified: Secondary | ICD-10-CM | POA: Insufficient documentation

## 2023-01-24 DIAGNOSIS — Z807 Family history of other malignant neoplasms of lymphoid, hematopoietic and related tissues: Secondary | ICD-10-CM | POA: Insufficient documentation

## 2023-01-24 DIAGNOSIS — Z9089 Acquired absence of other organs: Secondary | ICD-10-CM | POA: Insufficient documentation

## 2023-01-24 DIAGNOSIS — R634 Abnormal weight loss: Secondary | ICD-10-CM | POA: Insufficient documentation

## 2023-01-24 LAB — COMPREHENSIVE METABOLIC PANEL
ALT: 13 U/L (ref 0–44)
AST: 30 U/L (ref 15–41)
Albumin: 4.1 g/dL (ref 3.5–5.0)
Alkaline Phosphatase: 71 U/L (ref 38–126)
Anion gap: 6 (ref 5–15)
BUN: 12 mg/dL (ref 8–23)
CO2: 26 mmol/L (ref 22–32)
Calcium: 9 mg/dL (ref 8.9–10.3)
Chloride: 103 mmol/L (ref 98–111)
Creatinine, Ser: 0.79 mg/dL (ref 0.44–1.00)
GFR, Estimated: >60 mL/min (ref >60)
Glucose, Bld: 96 mg/dL (ref 70–99)
Potassium: 4.1 mmol/L (ref 3.5–5.1)
Sodium: 135 mmol/L (ref 135–145)
Total Bilirubin: 1.3 mg/dL — ABNORMAL HIGH (ref 0.3–1.2)
Total Protein: 7.1 g/dL (ref 6.5–8.1)

## 2023-01-24 LAB — CBC WITH DIFFERENTIAL/PLATELET
Abs Immature Granulocytes: 0.3 10*3/uL — ABNORMAL HIGH (ref 0.00–0.07)
Basophils Absolute: 0 10*3/uL (ref 0.0–0.1)
Basophils Relative: 0 %
Eosinophils Absolute: 0.1 10*3/uL (ref 0.0–0.5)
Eosinophils Relative: 2 %
HCT: 28.1 % — ABNORMAL LOW (ref 36.0–46.0)
Hemoglobin: 8.6 g/dL — ABNORMAL LOW (ref 12.0–15.0)
Lymphocytes Relative: 21 %
Lymphs Abs: 0.7 10*3/uL (ref 0.7–4.0)
MCH: 29.8 pg (ref 26.0–34.0)
MCHC: 30.6 g/dL (ref 30.0–36.0)
MCV: 97.2 fL (ref 80.0–100.0)
Metamyelocytes Relative: 8 %
Monocytes Absolute: 0 10*3/uL — ABNORMAL LOW (ref 0.1–1.0)
Monocytes Relative: 1 %
Neutro Abs: 2.4 10*3/uL (ref 1.7–7.7)
Neutrophils Relative %: 68 %
Platelets: 144 10*3/uL — ABNORMAL LOW (ref 150–400)
RBC: 2.89 MIL/uL — ABNORMAL LOW (ref 3.87–5.11)
RDW: 20.8 % — ABNORMAL HIGH (ref 11.5–15.5)
WBC: 3.5 10*3/uL — ABNORMAL LOW (ref 4.0–10.5)
nRBC: 2.5 % — ABNORMAL HIGH (ref 0.0–0.2)
nRBC: 3 /100 WBC — ABNORMAL HIGH

## 2023-01-24 LAB — MAGNESIUM: Magnesium: 2 mg/dL (ref 1.7–2.4)

## 2023-01-24 LAB — LACTATE DEHYDROGENASE: LDH: 900 U/L — ABNORMAL HIGH (ref 98–192)

## 2023-01-24 MED ORDER — PALONOSETRON HCL INJECTION 0.25 MG/5ML
0.2500 mg | Freq: Once | INTRAVENOUS | Status: AC
  Administered 2023-01-24: 0.25 mg via INTRAVENOUS
  Filled 2023-01-24: qty 5

## 2023-01-24 MED ORDER — HEPARIN SOD (PORK) LOCK FLUSH 100 UNIT/ML IV SOLN
500.0000 [IU] | Freq: Once | INTRAVENOUS | Status: AC
  Administered 2023-01-24: 500 [IU] via INTRAVENOUS

## 2023-01-24 MED ORDER — SODIUM CHLORIDE 0.9 % IV SOLN
75.0000 mg/m2 | Freq: Once | INTRAVENOUS | Status: AC
  Administered 2023-01-24: 126 mg via INTRAVENOUS
  Filled 2023-01-24: qty 12.6

## 2023-01-24 MED ORDER — SODIUM CHLORIDE 0.9 % IV SOLN: Freq: Once | INTRAVENOUS | Status: AC

## 2023-01-24 MED ORDER — SODIUM CHLORIDE 0.9% FLUSH
10.0000 mL | Freq: Once | INTRAVENOUS | Status: AC
  Administered 2023-01-24: 10 mL via INTRAVENOUS

## 2023-01-24 NOTE — Patient Instructions (Signed)
[facility]  Discharge Instructions: Thank you for choosing [facility] to provide your oncology and hematology care.  If you have a lab appointment with the Cancer Center - please note that after April 8th, 2024, all labs will be drawn in the cancer center.  You do not have to check in or register with the main entrance as you have in the past but will complete your check-in in the cancer center.  Wear comfortable clothing and clothing appropriate for easy access to any Portacath or PICC line.   We strive to give you quality time with your provider. You may need to reschedule your appointment if you arrive late (15 or more minutes).  Arriving late affects you and other patients whose appointments are after yours.  Also, if you miss three or more appointments without notifying the office, you may be dismissed from the clinic at the provider's discretion.      For prescription refill requests, have your pharmacy contact our office and allow 72 hours for refills to be completed.    Today you received the following chemotherapy and/or immunotherapy agents Vidaza.  Azacitidine Injection What is this medication? AZACITIDINE (ay za SITE i deen) treats blood and bone marrow cancers. It works by slowing down the growth of cancer cells. This medicine may be used for other purposes; ask your health care provider or pharmacist if you have questions. COMMON BRAND NAME(S): Vidaza What should I tell my care team before I take this medication? They need to know if you have any of these conditions: Kidney disease Liver disease Low blood cell levels, such as low white cells, platelets, or red blood cells Low levels of albumin in the blood Low levels of bicarbonate in the blood An unusual or allergic reaction to azacitidine, mannitol, other medications, foods, dyes, or preservatives If you or your partner are pregnant or trying to get pregnant Breast-feeding How should I  use this medication? This medication is injected into a vein or under the skin. It is given by your care team in a hospital or clinic setting. Talk to your care team about the use of this medication in children. While it may be prescribed for children as young as 1 month for selected conditions, precautions do apply. Overdosage: If you think you have taken too much of this medicine contact a poison control center or emergency room at once. NOTE: This medicine is only for you. Do not share this medicine with others. What if I miss a dose? Keep appointments for follow-up doses. It is important not to miss your dose. Call your care team if you are unable to keep an appointment. What may interact with this medication? Interactions are not expected. This list may not describe all possible interactions. Give your health care provider a list of all the medicines, herbs, non-prescription drugs, or dietary supplements you use. Also tell them if you smoke, drink alcohol, or use illegal drugs. Some items may interact with your medicine. What should I watch for while using this medication? Your condition will be monitored carefully while you are receiving this medication. You may need blood work while taking this medication. This medication may make you feel generally unwell. This is not uncommon as chemotherapy can affect healthy cells as well as cancer cells. Report any side effects. Continue your course of treatment even though you feel ill unless your care team tells you to stop. Other product types may be available that contain the   medication azacitidine. The injection and oral products should not be used in place of one another. Talk to your care team if you have questions. This medication can cause serious side effects. To reduce the risk, your care team may give you other medications to take before receiving this one. Be sure to follow the directions from your care team. This medication may increase your  risk of getting an infection. Call your care team for advice if you get a fever, chills, sore throat, or other symptoms of a cold or flu. Do not treat yourself. Try to avoid being around people who are sick. Avoid taking medications that contain aspirin, acetaminophen, ibuprofen, naproxen, or ketoprofen unless instructed by your care team. These medications may hide a fever. This medication may increase your risk to bruise or bleed. Call your care team if you notice any unusual bleeding. Be careful brushing or flossing your teeth or using a toothpick because you may get an infection or bleed more easily. If you have any dental work done, tell your dentist you are receiving this medication. Talk to your care team if you or your partner may be pregnant. Serious birth defects can occur if you take this medication during pregnancy and for 6 months after the last dose. You will need a negative pregnancy test before starting this medication. Contraception is recommended while taking his medication and for 6 months after the last dose. Your care team can help you find the option that works for you. If your partner can get pregnant, use a condom during sex while taking this medication and for 3 months after the last dose. Do not breastfeed while taking this medication and for 1 week after the last dose. This medication may cause infertility. Talk to your care team if you are concerned about your fertility. What side effects may I notice from receiving this medication? Side effects that you should report to your care team as soon as possible: Allergic reactions--skin rash, itching, hives, swelling of the face, lips, tongue, or throat Infection--fever, chills, cough, sore throat, wounds that don't heal, pain or trouble when passing urine, general feeling of discomfort or being unwell Kidney injury--decrease in the amount of urine, swelling of the ankles, hands, or feet Liver injury--right upper belly pain, loss  of appetite, nausea, light-colored stool, dark yellow or [place] urine, yellowing skin or eyes, unusual weakness or fatigue Low red blood cell level--unusual weakness or fatigue, dizziness, headache, trouble breathing Tumor lysis syndrome (TLS)--nausea, vomiting, diarrhea, decrease in the amount of urine, dark urine, unusual weakness or fatigue, confusion, muscle pain or cramps, fast or irregular heartbeat, joint pain Unusual bruising or bleeding Side effects that usually do not require medical attention (report to your care team if they continue or are bothersome): Constipation Diarrhea Nausea Pain, redness, or irritation at injection site Vomiting This list may not describe all possible side effects. Call your doctor for medical advice about side effects. You may report side effects to FDA at 1-800-FDA-1088. Where should I keep my medication? This medication is given in a hospital or clinic. It will not be stored at home. NOTE: This sheet is a summary. It may not cover all possible information. If you have questions about this medicine, talk to your doctor, pharmacist, or health care provider.  2024 Elsevier/Gold Standard (2021-11-19 00:00:00)        To help prevent nausea and vomiting after your treatment, we encourage you to take your nausea medication as directed.  BELOW ARE SYMPTOMS   THAT SHOULD BE REPORTED IMMEDIATELY: *FEVER GREATER THAN 100.4 F (38 C) OR HIGHER *CHILLS OR SWEATING *NAUSEA AND VOMITING THAT IS NOT CONTROLLED WITH YOUR NAUSEA MEDICATION *UNUSUAL SHORTNESS OF BREATH *UNUSUAL BRUISING OR BLEEDING *URINARY PROBLEMS (pain or burning when urinating, or frequent urination) *BOWEL PROBLEMS (unusual diarrhea, constipation, pain near the anus) TENDERNESS IN MOUTH AND THROAT WITH OR WITHOUT PRESENCE OF ULCERS (sore throat, sores in mouth, or a toothache) UNUSUAL RASH, SWELLING OR PAIN  UNUSUAL VAGINAL DISCHARGE OR ITCHING   Items with * indicate a potential emergency and  should be followed up as soon as possible or go to the Emergency Department if any problems should occur.  Please show the CHEMOTHERAPY ALERT CARD or IMMUNOTHERAPY ALERT CARD at check-in to the Emergency Department and triage nurse.  Should you have questions after your visit or need to cancel or reschedule your appointment, please contact [facility] [phone]  and follow the prompts.  Office hours are 8:00 a.m. to 4:30 p.m. Monday - Friday. Please note that voicemails left after 4:00 p.m. may not be returned until the following business day.  We are closed weekends and major holidays. You have access to a nurse at all times for urgent questions. Please call the main number to the clinic [phone] and follow the prompts.  For any non-urgent questions, you may also contact your provider using MyChart. We now offer e-Visits for anyone 18 and older to request care online for non-urgent symptoms. For details visit [email or web address].   Also download the MyChart app! Go to the app store, search "MyChart", open the app, select [place], and log in with your MyChart username and password.   

## 2023-01-24 NOTE — Progress Notes (Signed)
Patient has been examined by Dr. Katragadda. Vital signs and labs have been reviewed by MD - ANC, Creatinine, LFTs, hemoglobin, and platelets are within treatment parameters per M.D. - pt may proceed with treatment.  Primary RN and pharmacy notified.  

## 2023-01-24 NOTE — Patient Instructions (Signed)
[facility] Discharge Instructions   You were seen and examined today by Dr. Katragadda.  He reviewed the results of your lab work which are normal/stable.   We will proceed with your treatment today.  Return as scheduled.    Thank you for choosing [facility] to provide your oncology and hematology care.  To afford each patient quality time with our provider, please arrive at least 15 minutes before your scheduled appointment time.   If you have a lab appointment with the Cancer Center please come in thru the Main Entrance and check in at the main information desk.  You need to re-schedule your appointment should you arrive 10 or more minutes late.  We strive to give you quality time with our providers, and arriving late affects you and other patients whose appointments are after yours.  Also, if you no show three or more times for appointments you may be dismissed from the clinic at the providers discretion.     Again, thank you for choosing [facility].  Our hope is that these requests will decrease the amount of time that you wait before being seen by our physicians.       _____________________________________________________________  Should you have questions after your visit to [facility], please contact our office at [phone] and follow the prompts.  Our office hours are 8:00 a.m. and 4:30 p.m. Monday - Friday.  Please note that voicemails left after 4:00 p.m. may not be returned until the following business day.  We are closed weekends and major holidays.  You do have access to a nurse 24-7, just call the main number to the clinic [phone] and do not press any options, hold on the line and a nurse will answer the phone.    For prescription refill requests, have your pharmacy contact our office and allow 72 hours.    Due to Covid, you will need to wear a mask upon entering  the hospital. If you do not have a mask, a mask will be given to you at the Main Entrance upon arrival. For doctor visits, patients may have 1 support person age 18 or older with them. For treatment visits, patients can not have anyone with them due to social distancing guidelines and our immunocompromised population.      

## 2023-01-24 NOTE — Progress Notes (Signed)
[facility] [address] [place] [address]    Clinic Day:  01/24/2023  Referring physician: Carylon Perches, [name]  Patient Care Team: Karen Perches, [name] as PCP - General (Internal Medicine) Karen Gauss Gerrit Friends, [name] as Attending Physician (Gastroenterology) Karen Frisk, [name] as Consulting Physician (Pulmonary Disease) Karen Massed, [name] as Medical Oncologist (Hematology)   ASSESSMENT & PLAN:   Assessment: 1.  Macrocytic anemia and thrombocytopenia: - Patient seen at the request of Dr. Ouida [name] for abnormal CBC. - 04/17/2022: WBC 15.8, Hb-9.7, PLT-128 - 03/24/2022: WBC-8.1 (N-43%, L-17%, M-19%, B-2%), Hb-9.6, MCV-99, PLT-115 - Smear review: 13% metamyelocytes, 2% myelocytes, 2% blasts,  - 10/19/2021: WBC-5.3 (N-61, L-21, M-10%), Hb-11, MCV-96, PLT-97 - BMBX (05/25/2022): Hypercellular bone marrow with myeloid hyperplasia with dysgranulopoiesis, erythroid hypoplasia and megakaryocytic hyperplasia with dyspoiesis.  Chromosome analysis was 22, XX.  Bone marrow blasts less than 5%.  Peripheral blood blasts less than 2%. - Mayo molecular model risk stratification: Intermediate 2 risk with at least 2 points (decreased hemoglobin less than 10, circulating immature cells).  Intermediate 2 risk with median OS 31 months. - Serum EPO level 41. - NGS: Positive for CBL, MPL, SRSF2, TET 2, RAD21 - PET scan (07/08/2022): Splenomegaly (volume 510 mm) with normal metabolic activity.  Uniform increase in marrow metabolic activity related to patient's anemia.  No lymphadenopathy. - BMBX (07/16/2022): Hypercellular bone marrow (95-100%) with myeloid hyperplasia, atypical monocytes, increased blasts (16% overall).  Atypical monocytosis present 23% of total events, expressing HLA-DR, CD38, CD4 dim, CD11C, CD13, CD14, CD36, CD64 with aberrant coexpression of CD56 and CD7. - Cycle 1 of azacitidine on 08/02/2022, cycle 2 on 08/30/2022.  Azacitidine decreased to 5 days starting cycle 5 on 11/22/2022 - BMBX  (09/21/2022): 3% blasts, hypercellular marrow with markedly increased dyspoietic megakaryocytes, increased fibrosis - BMBX (11/16/2022): Hypercellular marrow (70-80%) with marked GIST [name] (, myelofibrosis and 3% blasts on a very limited sample.   2.  Social/family history: - She lives at home by herself.  Son lives next door. - Does part-time work at [employer].  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  Non-smoker. - Maternal aunt had tumor in the breast, patient not certain if it is cancer.  2 maternal first cousins had lymphoma.  Mother had stomach cancer.    Plan: 1.  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 - She was evaluated by Dr. Lowell [name] on 01/11/2023. - She denies any infections in the last 6 weeks.  No GI side effects from azacitidine.  She is continuing to work part-time job. - Reviewed labs today: LDH elevated at 900.  Total bilirubin 1.3 and rest of LFTs are normal.  CBC: White count 3.5, platelet count 144, hemoglobin 8.6.  Differential is grossly normal.  Hemoglobin is staying between 8-9. - Recommend proceeding with cycle 7 today.  Agree with switching azacitidine to every 5 weeks.  She will come back on 02/28/2023 for next cycle.   2.  Folate deficiency: - Continue folic acid 1 mg tablet daily.   3.  Weight loss: - She is eating well.  She is not requiring Megace.   4.  Constipation: - Continue Colace.  Use lactulose as needed.  This is well-controlled.    Orders Placed This Encounter  Procedures   Magnesium    Standing Status:   Future    Standing Expiration Date:   04/10/2024  Lactate dehydrogenase    Standing Status:   Future    Standing Expiration Date:   04/10/2024   CMP (Cancer Center only)    Standing Status:   Future    Standing Expiration Date:   04/10/2024   CBC with Differential    Standing Status:   Future    Standing Expiration Date:   04/10/2024    Magnesium    Standing Status:   Future    Standing Expiration Date:   05/15/2024   Lactate dehydrogenase    Standing Status:   Future    Standing Expiration Date:   05/15/2024   CMP (Cancer Center only)    Standing Status:   Future    Standing Expiration Date:   05/15/2024   CBC with Differential    Standing Status:   Future    Standing Expiration Date:   05/15/2024   Magnesium    Standing Status:   Future    Standing Expiration Date:   06/19/2024   Lactate dehydrogenase    Standing Status:   Future    Standing Expiration Date:   06/19/2024   CMP (Cancer Center only)    Standing Status:   Future    Standing Expiration Date:   06/19/2024   CBC with Differential    Standing Status:   Future    Standing Expiration Date:   06/19/2024   Magnesium    Standing Status:   Future    Standing Expiration Date:   07/24/2024   Lactate dehydrogenase    Standing Status:   Future    Standing Expiration Date:   07/24/2024   CMP (Cancer Center only)    Standing Status:   Future    Standing Expiration Date:   07/24/2024   CBC with Differential    Standing Status:   Future    Standing Expiration Date:   07/24/2024      [name] R [name],acting as a scribe for Karen Massed, [name].,have documented all relevant documentation on the behalf of Karen Massed, [name],as directed by  Karen Massed, [name] while in the presence of Karen Massed, [name].  I, Karen Massed [name], have reviewed the above documentation for accuracy and completeness, and I agree with the above.    Karen Massed, [name]   7/8/20244:56 PM  CHIEF COMPLAINT:   Diagnosis: CMML-2    Cancer Staging  No matching staging information was found for the patient.   Prior Therapy: none  Current Therapy:  Azacitidine 70 mg/m x 7 days every 28 days    HISTORY OF PRESENT ILLNESS:   Oncology History  CMML (chronic myelomonocytic leukemia) (HCC)  06/03/2022 Initial Diagnosis   CMML (chronic myelomonocytic leukemia)  (HCC)   08/02/2022 -  Chemotherapy   Patient is on Treatment Plan : MYELODYSPLASIA  Azacitidine IVPB D1-7 q28d        INTERVAL HISTORY:   [name] is a 72 y.o. female presenting to clinic today for follow up of CMML-2. She was last seen by me on 12/20/22.  Today, she states that she is doing well overall. Her appetite level is at 75%. Her energy level is at 100%. She is accompanied by her son.  She denies any toxicities or problems with treatment. She denies diarrhea, nausea, vomiting and any infections within the last 5 weeks.   She reports her heart palpitations do not occur often. She notes tenderness in the LUQ upon palpation that is very mild and occasionally is painful without palpation.   She notes that  she is eating well and taking medications as prescribed.   PAST MEDICAL HISTORY:   Past Medical History: Past Medical History:  Diagnosis Date   Anxiety    Asthma    had 1 episode 1 year ago-no more problem   CHF (congestive heart failure) (HCC)    swelling of feet & ankles   CMML (chronic myelomonocytic leukemia) (HCC) 06/03/2022   Cough    Depression    OCD   GERD (gastroesophageal reflux disease)    severe   OCD (obsessive compulsive disorder)    Port-A-Cath in place 07/26/2022   Shortness of breath    with exertion   Yeast infection 04/09/2014    Surgical History: Past Surgical History:  Procedure Laterality Date   CARPAL TUNNEL RELEASE  03/22/2012   Procedure: CARPAL TUNNEL RELEASE;  Surgeon: Nicki Reaper, [name];  Location: [facility];  Service: Orthopedics;  Laterality: Right;  right carpal tunnel release   COLONOSCOPY  06/19/2012   Dr. Rourk:normal rectum and colon    ESOPHAGOGASTRODUODENOSCOPY N/A 12/26/2015   Dr. Jena Gauss: normal    IR IMAGING GUIDED PORT INSERTION  07/30/2022   TONSILLECTOMY     TUBAL LIGATION      Social History: Social History   Socioeconomic History   Marital status: Widowed    Spouse name: Not on file   Number of children:  1   Years of education: Not on file   Highest education level: Not on file  Occupational History   Occupation: retired  Tobacco Use   Smoking status: Never    Passive exposure: Yes   Smokeless tobacco: Never  Vaping Use   Vaping Use: Never used  Substance and Sexual Activity   Alcohol use: No   Drug use: No   Sexual activity: Yes    Birth control/protection: Surgical    Comment: tubal  Other Topics Concern   Not on file  Social History Narrative   Not on file   Social Determinants of Health   Financial Resource Strain: Not on file  Food Insecurity: Not on file  Transportation Needs: Not on file  Physical Activity: Not on file  Stress: Not on file  Social Connections: Not on file  Intimate Partner Violence: Not on file    Family History: Family History  Problem Relation Age of Onset   Asthma Mother    Macular degeneration Mother    Hypertension Mother    Stomach cancer Mother 44   Alzheimer's disease Father    Other Sister        blood clots   Diabetes Maternal Grandfather    Diabetes Paternal Grandfather    Other Son        enlarged spleen   Lymphoma Cousin        x2, both dx in their 22s   Colon cancer Neg Hx     Current Medications:  Current Outpatient Medications:    albuterol (VENTOLIN HFA) 108 (90 Base) MCG/ACT inhaler, INHALE 2 PUFFS BY MOUTH EVERY 6 HOURS AS NEEDED FOR SHORTNESS OF BREATH OR WHEEZING., Disp: 18 g, Rfl: 2   albuterol (VENTOLIN HFA) 108 (90 Base) MCG/ACT inhaler, Inhale 2 puffs into the lungs every 4 (four) hours as needed for wheezing or shortness of breath., Disp: 18 g, Rfl: 1   ARNUITY ELLIPTA 200 MCG/ACT AEPB, Inhale 1 puff into the lungs daily., Disp: 30 each, Rfl: 5   azaCITIDine 5 mg/2 mLs in lactated ringers infusion, Inject into the vein daily. Days  1-7 every 28 days, Disp: , Rfl:    azelastine (ASTELIN) 0.1 % nasal spray, SPRAY 1 TO 2 SPRAYS PER NOSTRIL TWICE DAILY., Disp: 30 mL, Rfl: 3   benzonatate (TESSALON) 200 MG  capsule, Take 200 mg by mouth 3 (three) times daily as needed., Disp: , Rfl:    BREZTRI AEROSPHERE 160-9-4.8 MCG/ACT AERO, Inhale 2 puffs into the lungs 2 (two) times daily., Disp: 10.7 g, Rfl: 5   cetirizine (ZYRTEC) 5 MG tablet, Take 1 tablet (5 mg total) by mouth daily., Disp: 30 tablet, Rfl: 0   ciclopirox (PENLAC) 8 % solution, Apply topically at bedtime. Apply over nail and surrounding skin. Apply daily over previous coat. After seven (7) days, may remove with alcohol and continue cycle., Disp: 6.6 mL, Rfl: 2   EPINEPHrine 0.3 mg/0.3 mL IJ SOAJ injection, Inject 0.3 mg into the muscle as needed for anaphylaxis., Disp: 2 each, Rfl: 1   famotidine (PEPCID) 20 MG tablet, Take 1 tablet (20 mg total) by mouth 2 (two) times daily., Disp: 60 tablet, Rfl: 5   FLUoxetine (PROZAC) 20 MG capsule, Take 1 capsule by mouth daily., Disp: , Rfl:    fluticasone (FLONASE) 50 MCG/ACT nasal spray, Place 2 sprays into both nostrils 2 (two) times daily., Disp: 16 g, Rfl: 5   fluticasone (FLOVENT HFA) 110 MCG/ACT inhaler, Inhale two puffs twice daily to prevent cough or wheeze. Rinse mouth after use., Disp: 12 g, Rfl: 5   folic acid (FOLVITE) 1 MG tablet, TAKE ONE TABLET BY MOUTH ONCE DAILY, Disp: 30 tablet, Rfl: 5   gabapentin (NEURONTIN) 300 MG capsule, , Disp: , Rfl:    ipratropium (ATROVENT) 0.06 % nasal spray, USE 2 SPRAYS IN EACH NOSTRIL THREE TIMES A DAY AS NEEDED., Disp: 15 mL, Rfl: 5   lactulose (CHRONULAC) 10 GM/15ML solution, take 30ml BY MOUTH EVERY THREE HOURS UNTIL bowel movement; THEN take 30ml ONCE DAILY AS NEEDED FOR CONSTIPATION, Disp: 450 mL, Rfl: 2   lidocaine-prilocaine (EMLA) cream, Apply a quarter sized amount to port a cath site and cover with plastic wrap one hour prior to infusion appointments, Disp: 30 g, Rfl: 3   megestrol (MEGACE) 400 MG/10ML suspension, Take 10 mLs (400 mg total) by mouth 2 (two) times daily., Disp: 480 mL, Rfl: 2   montelukast (SINGULAIR) 10 MG tablet, Take 1 tablet (10  mg total) by mouth at bedtime., Disp: 30 tablet, Rfl: 5   NON FORMULARY, [facility]  Antifungal (nail)-#1, Disp: , Rfl:    pantoprazole (PROTONIX) 40 MG tablet, Take 1 tablet (40 mg total) by mouth daily., Disp: 30 tablet, Rfl: 5   prochlorperazine (COMPAZINE) 10 MG tablet, Take 1 tablet (10 mg total) by mouth every 6 (six) hours as needed for nausea or vomiting., Disp: 30 tablet, Rfl: 0   Respiratory Therapy Supplies (FLUTTER) DEVI, Use as directed, Disp: 1 each, Rfl: 3   SF 5000 PLUS 1.1 % CREA dental cream, , Disp: , Rfl:    Spacer/Aero-Holding Chambers (AEROCHAMBER PLUS WITH MASK) inhaler, 1 each by Other route See admin instructions. Use with inhaler as instructed., Disp: 1 each, Rfl: 1   traZODone (DESYREL) 50 MG tablet, TAKE ONE TABLET BY MOUTH AT BEDTIME, Disp: 30 tablet, Rfl: 1  Current Facility-Administered Medications:    tezepelumab-ekko (TEZSPIRE) 210 MG/1. syringe 210 mg, 210 mg, Subcutaneous, Q28 days, Marcelyn Bruins, [name], 210 mg at 01/10/23 9562   Allergies: Allergies  Allergen Reactions   Levonorgestrel-Ethinyl Estrad Cough   Sulfa Antibiotics Other (  See Comments)    Unknown- pt unsure of reaction; believes it may be nausea   Sulfamethoxazole-Trimethoprim Other (See Comments)   Cephalosporins Other (See Comments)    Other Reaction: Toxicity    REVIEW OF SYSTEMS:   Review of Systems  Constitutional:  Negative for chills, fatigue and fever.  HENT:   Negative for lump/mass, mouth sores, nosebleeds, sore throat and trouble swallowing.   Eyes:  Negative for eye problems.  Respiratory:  Positive for cough and shortness of breath.   Cardiovascular:  Positive for palpitations. Negative for chest pain and leg swelling.  Gastrointestinal:  Positive for abdominal pain (occasional LUQ, 1/10 severity). Negative for constipation, diarrhea, nausea and vomiting.  Genitourinary:  Negative for bladder incontinence, difficulty urinating, dysuria, frequency,  hematuria and nocturia.   Musculoskeletal:  Negative for arthralgias, back pain, flank pain, myalgias and neck pain.  Skin:  Negative for itching and rash.  Neurological:  Negative for dizziness, headaches and numbness.  Hematological:  Does not bruise/bleed easily.  Psychiatric/Behavioral:  Negative for depression, sleep disturbance and suicidal ideas. The patient is not nervous/anxious.   All other systems reviewed and are negative.    VITALS:   There were no vitals taken for this visit.  Wt Readings from Last 3 Encounters:  01/24/23 135 lb 3.2 oz (61.3 kg)  12/20/22 139 lb 6.4 oz (63.2 kg)  11/22/22 138 lb (62.6 kg)    There is no height or weight on file to calculate BMI.  Performance status (ECOG): 1 - Symptomatic but completely ambulatory  PHYSICAL EXAM:   Physical Exam Vitals and nursing note reviewed. Exam conducted with a chaperone present.  Constitutional:      Appearance: Normal appearance.  Cardiovascular:     Rate and Rhythm: Normal rate and regular rhythm.     Pulses: Normal pulses.     Heart sounds: Normal heart sounds.  Pulmonary:     Effort: Pulmonary effort is normal.     Breath sounds: Normal breath sounds.  Abdominal:     Palpations: Abdomen is soft. There is no hepatomegaly, splenomegaly or mass.     Tenderness: There is no abdominal tenderness.  Musculoskeletal:     Right lower leg: No edema.     Left lower leg: No edema.  Lymphadenopathy:     Cervical: No cervical adenopathy.     Right cervical: No superficial, deep or posterior cervical adenopathy.    Left cervical: No superficial, deep or posterior cervical adenopathy.     Upper Body:     Right upper body: No supraclavicular or axillary adenopathy.     Left upper body: No supraclavicular or axillary adenopathy.  Neurological:     General: No focal deficit present.     Mental Status: She is alert and oriented to person, place, and time.  Psychiatric:        Mood and Affect: Mood normal.         Behavior: Behavior normal.     LABS:      Latest Ref Rng & Units 01/24/2023   11:56 AM 12/20/2022   10:48 AM 12/07/2022    2:37 PM  CBC  WBC 4.0 - 10.5 K/uL 3.5  3.4  2.7   Hemoglobin 12.0 - 15.0 g/dL 8.6  8.7  8.5   Hematocrit 36.0 - 46.0 % 28.1  27.8  27.2   Platelets 150 - 400 K/uL 144  178  115       Latest Ref Rng & Units 01/24/2023  11:56 AM 12/20/2022   10:48 AM 12/07/2022    2:37 PM  CMP  Glucose 70 - 99 mg/dL 96  94  91   BUN 8 - 23 mg/dL 12  11  15    Creatinine 0.44 - 1.00 mg/dL 4.09  8.11  9.14   Sodium 135 - 145 mmol/L 135  135  133   Potassium 3.5 - 5.1 mmol/L 4.1  3.8  4.1   Chloride 98 - 111 mmol/L 103  103  100   CO2 22 - 32 mmol/L 26  26  26    Calcium 8.9 - 10.3 mg/dL 9.0  8.9  9.2   Total Protein 6.5 - 8.1 g/dL 7.1  6.8  7.1   Total Bilirubin 0.3 - 1.2 mg/dL 1.3  1.2  1.2   Alkaline Phos 38 - 126 U/L 71  56  66   AST 15 - 41 U/L 30  28  22    ALT 0 - 44 U/L 13  11  14       No results found for: "CEA1", "CEA" / No results found for: "CEA1", "CEA" No results found for: "PSA1" No results found for: "CAN199" No results found for: "CAN125"  Lab Results  Component Value Date   TOTALPROTELP 6.3 04/26/2022   ALBUMINELP 3.8 04/26/2022   A1GS 0.3 04/26/2022   A2GS 0.5 04/26/2022   BETS 0.8 04/26/2022   GAMS 0.9 04/26/2022   MSPIKE Not Observed 04/26/2022   SPEI Comment 04/26/2022   Lab Results  Component Value Date   TIBC 343 04/26/2022   FERRITIN 114 04/26/2022   IRONPCTSAT 25 04/26/2022   Lab Results  Component Value Date   LDH 900 (H) 01/24/2023   LDH 842 (H) 12/20/2022   LDH 710 (H) 11/22/2022     STUDIES:   No results found.

## 2023-01-24 NOTE — Progress Notes (Signed)
Patient presents today for chemotherapy infusion. Patient is in satisfactory condition with no new complaints voiced.  Vital signs are stable.  Labs reviewed by Dr. Ellin Saba during the office visit and all labs are within treatment parameters.  We will proceed with treatment per MD orders.   Patient tolerated treatment well with no complaints voiced.  Patient left ambulatory with son in stable condition.  Vital signs stable at discharge.  Follow up as scheduled.

## 2023-01-25 ENCOUNTER — Other Ambulatory Visit: Payer: Self-pay

## 2023-01-25 ENCOUNTER — Inpatient Hospital Stay: Payer: Medicare HMO

## 2023-01-25 VITALS — BP 106/65 | HR 80 | Temp 98.0°F | Resp 18

## 2023-01-25 DIAGNOSIS — D696 Thrombocytopenia, unspecified: Secondary | ICD-10-CM | POA: Diagnosis not present

## 2023-01-25 DIAGNOSIS — Z95828 Presence of other vascular implants and grafts: Secondary | ICD-10-CM

## 2023-01-25 DIAGNOSIS — R002 Palpitations: Secondary | ICD-10-CM | POA: Diagnosis not present

## 2023-01-25 DIAGNOSIS — R634 Abnormal weight loss: Secondary | ICD-10-CM | POA: Diagnosis not present

## 2023-01-25 DIAGNOSIS — E538 Deficiency of other specified B group vitamins: Secondary | ICD-10-CM | POA: Diagnosis not present

## 2023-01-25 DIAGNOSIS — K5900 Constipation, unspecified: Secondary | ICD-10-CM | POA: Diagnosis not present

## 2023-01-25 DIAGNOSIS — C9310 Chronic myelomonocytic leukemia not having achieved remission: Secondary | ICD-10-CM | POA: Diagnosis not present

## 2023-01-25 DIAGNOSIS — D539 Nutritional anemia, unspecified: Secondary | ICD-10-CM | POA: Diagnosis not present

## 2023-01-25 DIAGNOSIS — R7402 Elevation of levels of lactic acid dehydrogenase (LDH): Secondary | ICD-10-CM | POA: Diagnosis not present

## 2023-01-25 DIAGNOSIS — Z5111 Encounter for antineoplastic chemotherapy: Secondary | ICD-10-CM | POA: Diagnosis not present

## 2023-01-25 MED ORDER — SODIUM CHLORIDE 0.9 % IV SOLN: Freq: Once | INTRAVENOUS | Status: AC

## 2023-01-25 MED ORDER — SODIUM CHLORIDE 0.9% FLUSH
10.0000 mL | Freq: Once | INTRAVENOUS | Status: AC
  Administered 2023-01-25: 10 mL via INTRAVENOUS

## 2023-01-25 MED ORDER — HEPARIN SOD (PORK) LOCK FLUSH 100 UNIT/ML IV SOLN
500.0000 [IU] | Freq: Once | INTRAVENOUS | Status: AC
  Administered 2023-01-25: 500 [IU] via INTRAVENOUS

## 2023-01-25 MED ORDER — SODIUM CHLORIDE 0.9 % IV SOLN
75.0000 mg/m2 | Freq: Once | INTRAVENOUS | Status: AC
  Administered 2023-01-25: 126 mg via INTRAVENOUS
  Filled 2023-01-25: qty 12.6

## 2023-01-25 NOTE — Progress Notes (Signed)
Patient tolerated treatment well with no complaints voiced.  Patient left ambulatory in stable condition.  Vital signs stable at discharge.  Follow up as scheduled.    

## 2023-01-25 NOTE — Progress Notes (Signed)
Patient is here for D2C7 Vidaza per MD orders.  She reports that she is overall doing very well.  The heat has her asthma flared up and she has a cough but this is nothing new for her. We will proceed with her treatment today.

## 2023-01-25 NOTE — Patient Instructions (Signed)
[facility]  Discharge Instructions: Thank you for choosing [facility] to provide your oncology and hematology care.  If you have a lab appointment with the Cancer Center - please note that after April 8th, 2024, all labs will be drawn in the cancer center.  You do not have to check in or register with the main entrance as you have in the past but will complete your check-in in the cancer center.  Wear comfortable clothing and clothing appropriate for easy access to any Portacath or PICC line.   We strive to give you quality time with your provider. You may need to reschedule your appointment if you arrive late (15 or more minutes).  Arriving late affects you and other patients whose appointments are after yours.  Also, if you miss three or more appointments without notifying the office, you may be dismissed from the clinic at the provider's discretion.      For prescription refill requests, have your pharmacy contact our office and allow 72 hours for refills to be completed.    Today you received the following chemotherapy and/or immunotherapy agents Vidaza.  Azacitidine Injection What is this medication? AZACITIDINE (ay za SITE i deen) treats blood and bone marrow cancers. It works by slowing down the growth of cancer cells. This medicine may be used for other purposes; ask your health care provider or pharmacist if you have questions. COMMON BRAND NAME(S): Vidaza What should I tell my care team before I take this medication? They need to know if you have any of these conditions: Kidney disease Liver disease Low blood cell levels, such as low white cells, platelets, or red blood cells Low levels of albumin in the blood Low levels of bicarbonate in the blood An unusual or allergic reaction to azacitidine, mannitol, other medications, foods, dyes, or preservatives If you or your partner are pregnant or trying to get pregnant Breast-feeding How should I  use this medication? This medication is injected into a vein or under the skin. It is given by your care team in a hospital or clinic setting. Talk to your care team about the use of this medication in children. While it may be prescribed for children as young as 1 month for selected conditions, precautions do apply. Overdosage: If you think you have taken too much of this medicine contact a poison control center or emergency room at once. NOTE: This medicine is only for you. Do not share this medicine with others. What if I miss a dose? Keep appointments for follow-up doses. It is important not to miss your dose. Call your care team if you are unable to keep an appointment. What may interact with this medication? Interactions are not expected. This list may not describe all possible interactions. Give your health care provider a list of all the medicines, herbs, non-prescription drugs, or dietary supplements you use. Also tell them if you smoke, drink alcohol, or use illegal drugs. Some items may interact with your medicine. What should I watch for while using this medication? Your condition will be monitored carefully while you are receiving this medication. You may need blood work while taking this medication. This medication may make you feel generally unwell. This is not uncommon as chemotherapy can affect healthy cells as well as cancer cells. Report any side effects. Continue your course of treatment even though you feel ill unless your care team tells you to stop. Other product types may be available that contain the   medication azacitidine. The injection and oral products should not be used in place of one another. Talk to your care team if you have questions. This medication can cause serious side effects. To reduce the risk, your care team may give you other medications to take before receiving this one. Be sure to follow the directions from your care team. This medication may increase your  risk of getting an infection. Call your care team for advice if you get a fever, chills, sore throat, or other symptoms of a cold or flu. Do not treat yourself. Try to avoid being around people who are sick. Avoid taking medications that contain aspirin, acetaminophen, ibuprofen, naproxen, or ketoprofen unless instructed by your care team. These medications may hide a fever. This medication may increase your risk to bruise or bleed. Call your care team if you notice any unusual bleeding. Be careful brushing or flossing your teeth or using a toothpick because you may get an infection or bleed more easily. If you have any dental work done, tell your dentist you are receiving this medication. Talk to your care team if you or your partner may be pregnant. Serious birth defects can occur if you take this medication during pregnancy and for 6 months after the last dose. You will need a negative pregnancy test before starting this medication. Contraception is recommended while taking his medication and for 6 months after the last dose. Your care team can help you find the option that works for you. If your partner can get pregnant, use a condom during sex while taking this medication and for 3 months after the last dose. Do not breastfeed while taking this medication and for 1 week after the last dose. This medication may cause infertility. Talk to your care team if you are concerned about your fertility. What side effects may I notice from receiving this medication? Side effects that you should report to your care team as soon as possible: Allergic reactions--skin rash, itching, hives, swelling of the face, lips, tongue, or throat Infection--fever, chills, cough, sore throat, wounds that don't heal, pain or trouble when passing urine, general feeling of discomfort or being unwell Kidney injury--decrease in the amount of urine, swelling of the ankles, hands, or feet Liver injury--right upper belly pain, loss  of appetite, nausea, light-colored stool, dark yellow or Suanne Minahan urine, yellowing skin or eyes, unusual weakness or fatigue Low red blood cell level--unusual weakness or fatigue, dizziness, headache, trouble breathing Tumor lysis syndrome (TLS)--nausea, vomiting, diarrhea, decrease in the amount of urine, dark urine, unusual weakness or fatigue, confusion, muscle pain or cramps, fast or irregular heartbeat, joint pain Unusual bruising or bleeding Side effects that usually do not require medical attention (report to your care team if they continue or are bothersome): Constipation Diarrhea Nausea Pain, redness, or irritation at injection site Vomiting This list may not describe all possible side effects. Call your doctor for medical advice about side effects. You may report side effects to FDA at 1-800-FDA-1088. Where should I keep my medication? This medication is given in a hospital or clinic. It will not be stored at home. NOTE: This sheet is a summary. It may not cover all possible information. If you have questions about this medicine, talk to your doctor, pharmacist, or health care provider.  2024 Elsevier/Gold Standard (2021-11-19 00:00:00)        To help prevent nausea and vomiting after your treatment, we encourage you to take your nausea medication as directed.  BELOW ARE SYMPTOMS   THAT SHOULD BE REPORTED IMMEDIATELY: *FEVER GREATER THAN 100.4 F (38 C) OR HIGHER *CHILLS OR SWEATING *NAUSEA AND VOMITING THAT IS NOT CONTROLLED WITH YOUR NAUSEA MEDICATION *UNUSUAL SHORTNESS OF BREATH *UNUSUAL BRUISING OR BLEEDING *URINARY PROBLEMS (pain or burning when urinating, or frequent urination) *BOWEL PROBLEMS (unusual diarrhea, constipation, pain near the anus) TENDERNESS IN MOUTH AND THROAT WITH OR WITHOUT PRESENCE OF ULCERS (sore throat, sores in mouth, or a toothache) UNUSUAL RASH, SWELLING OR PAIN  UNUSUAL VAGINAL DISCHARGE OR ITCHING   Items with * indicate a potential emergency and  should be followed up as soon as possible or go to the Emergency Department if any problems should occur.  Please show the CHEMOTHERAPY ALERT CARD or IMMUNOTHERAPY ALERT CARD at check-in to the Emergency Department and triage nurse.  Should you have questions after your visit or need to cancel or reschedule your appointment, please contact [facility] [phone]  and follow the prompts.  Office hours are 8:00 a.m. to 4:30 p.m. Monday - Friday. Please note that voicemails left after 4:00 p.m. may not be returned until the following business day.  We are closed weekends and major holidays. You have access to a nurse at all times for urgent questions. Please call the main number to the clinic [phone] and follow the prompts.  For any non-urgent questions, you may also contact your provider using MyChart. We now offer e-Visits for anyone 18 and older to request care online for non-urgent symptoms. For details visit [email or web address].   Also download the MyChart app! Go to the app store, search "MyChart", open the app, select [place], and log in with your MyChart username and password.   

## 2023-01-26 ENCOUNTER — Inpatient Hospital Stay: Payer: Medicare HMO

## 2023-01-26 VITALS — BP 99/83 | HR 75 | Temp 98.9°F | Resp 18

## 2023-01-26 DIAGNOSIS — R002 Palpitations: Secondary | ICD-10-CM | POA: Diagnosis not present

## 2023-01-26 DIAGNOSIS — R7402 Elevation of levels of lactic acid dehydrogenase (LDH): Secondary | ICD-10-CM | POA: Diagnosis not present

## 2023-01-26 DIAGNOSIS — Z95828 Presence of other vascular implants and grafts: Secondary | ICD-10-CM

## 2023-01-26 DIAGNOSIS — D539 Nutritional anemia, unspecified: Secondary | ICD-10-CM | POA: Diagnosis not present

## 2023-01-26 DIAGNOSIS — Z5111 Encounter for antineoplastic chemotherapy: Secondary | ICD-10-CM | POA: Diagnosis not present

## 2023-01-26 DIAGNOSIS — D696 Thrombocytopenia, unspecified: Secondary | ICD-10-CM | POA: Diagnosis not present

## 2023-01-26 DIAGNOSIS — C9310 Chronic myelomonocytic leukemia not having achieved remission: Secondary | ICD-10-CM

## 2023-01-26 DIAGNOSIS — R634 Abnormal weight loss: Secondary | ICD-10-CM | POA: Diagnosis not present

## 2023-01-26 DIAGNOSIS — K5900 Constipation, unspecified: Secondary | ICD-10-CM | POA: Diagnosis not present

## 2023-01-26 DIAGNOSIS — E538 Deficiency of other specified B group vitamins: Secondary | ICD-10-CM | POA: Diagnosis not present

## 2023-01-26 MED ORDER — SODIUM CHLORIDE 0.9 % IV SOLN: Freq: Once | INTRAVENOUS | Status: AC

## 2023-01-26 MED ORDER — SODIUM CHLORIDE 0.9 % IV SOLN
75.0000 mg/m2 | Freq: Once | INTRAVENOUS | Status: AC
  Administered 2023-01-26: 126 mg via INTRAVENOUS
  Filled 2023-01-26: qty 12.6

## 2023-01-26 MED ORDER — PALONOSETRON HCL INJECTION 0.25 MG/5ML
0.2500 mg | Freq: Once | INTRAVENOUS | Status: AC
  Administered 2023-01-26: 0.25 mg via INTRAVENOUS
  Filled 2023-01-26: qty 5

## 2023-01-26 MED ORDER — SODIUM CHLORIDE 0.9% FLUSH
10.0000 mL | Freq: Once | INTRAVENOUS | Status: AC
  Administered 2023-01-26: 10 mL via INTRAVENOUS

## 2023-01-26 MED ORDER — HEPARIN SOD (PORK) LOCK FLUSH 100 UNIT/ML IV SOLN
500.0000 [IU] | Freq: Once | INTRAVENOUS | Status: AC
  Administered 2023-01-26: 500 [IU] via INTRAVENOUS

## 2023-01-26 NOTE — Progress Notes (Signed)
Patient tolerated chemotherapy with no complaints voiced.  Side effects with management reviewed with understanding verbalized.  Port site clean and dry with no bruising or swelling noted at site.  Good blood return noted before and after administration of chemotherapy.  Band aid applied.  Patient left in satisfactory condition with VSS and no s/s of distress noted.   

## 2023-01-26 NOTE — Patient Instructions (Signed)
[facility]  Discharge Instructions: Thank you for choosing [facility] to provide your oncology and hematology care.  If you have a lab appointment with the Cancer Center - please note that after April 8th, 2024, all labs will be drawn in the cancer center.  You do not have to check in or register with the main entrance as you have in the past but will complete your check-in in the cancer center.  Wear comfortable clothing and clothing appropriate for easy access to any Portacath or PICC line.   We strive to give you quality time with your provider. You may need to reschedule your appointment if you arrive late (15 or more minutes).  Arriving late affects you and other patients whose appointments are after yours.  Also, if you miss three or more appointments without notifying the office, you may be dismissed from the clinic at the provider's discretion.      For prescription refill requests, have your pharmacy contact our office and allow 72 hours for refills to be completed.  To help prevent nausea and vomiting after your treatment, we encourage you to take your nausea medication as directed.  BELOW ARE SYMPTOMS THAT SHOULD BE REPORTED IMMEDIATELY: *FEVER GREATER THAN 100.4 F (38 C) OR HIGHER *CHILLS OR SWEATING *NAUSEA AND VOMITING THAT IS NOT CONTROLLED WITH YOUR NAUSEA MEDICATION *UNUSUAL SHORTNESS OF BREATH *UNUSUAL BRUISING OR BLEEDING *URINARY PROBLEMS (pain or burning when urinating, or frequent urination) *BOWEL PROBLEMS (unusual diarrhea, constipation, pain near the anus) TENDERNESS IN MOUTH AND THROAT WITH OR WITHOUT PRESENCE OF ULCERS (sore throat, sores in mouth, or a toothache) UNUSUAL RASH, SWELLING OR PAIN  UNUSUAL VAGINAL DISCHARGE OR ITCHING   Items with * indicate a potential emergency and should be followed up as soon as possible or go to the Emergency Department if any problems should occur.  Please show the CHEMOTHERAPY ALERT CARD or  IMMUNOTHERAPY ALERT CARD at check-in to the Emergency Department and triage nurse.  Should you have questions after your visit or need to cancel or reschedule your appointment, please contact [facility] [phone]  and follow the prompts.  Office hours are 8:00 a.m. to 4:30 p.m. Monday - Friday. Please note that voicemails left after 4:00 p.m. may not be returned until the following business day.  We are closed weekends and major holidays. You have access to a nurse at all times for urgent questions. Please call the main number to the clinic [phone] and follow the prompts.  For any non-urgent questions, you may also contact your provider using MyChart. We now offer e-Visits for anyone 18 and older to request care online for non-urgent symptoms. For details visit [email or web address].   Also download the MyChart app! Go to the app store, search "MyChart", open the app, select [place], and log in with your MyChart username and password.   

## 2023-01-27 ENCOUNTER — Inpatient Hospital Stay: Payer: Medicare HMO

## 2023-01-27 VITALS — BP 104/64 | HR 79 | Temp 96.9°F | Resp 16

## 2023-01-27 DIAGNOSIS — C9310 Chronic myelomonocytic leukemia not having achieved remission: Secondary | ICD-10-CM | POA: Diagnosis not present

## 2023-01-27 DIAGNOSIS — Z5111 Encounter for antineoplastic chemotherapy: Secondary | ICD-10-CM | POA: Diagnosis not present

## 2023-01-27 DIAGNOSIS — D539 Nutritional anemia, unspecified: Secondary | ICD-10-CM | POA: Diagnosis not present

## 2023-01-27 DIAGNOSIS — R634 Abnormal weight loss: Secondary | ICD-10-CM | POA: Diagnosis not present

## 2023-01-27 DIAGNOSIS — Z95828 Presence of other vascular implants and grafts: Secondary | ICD-10-CM

## 2023-01-27 DIAGNOSIS — R002 Palpitations: Secondary | ICD-10-CM | POA: Diagnosis not present

## 2023-01-27 DIAGNOSIS — E538 Deficiency of other specified B group vitamins: Secondary | ICD-10-CM | POA: Diagnosis not present

## 2023-01-27 DIAGNOSIS — D696 Thrombocytopenia, unspecified: Secondary | ICD-10-CM | POA: Diagnosis not present

## 2023-01-27 DIAGNOSIS — R7402 Elevation of levels of lactic acid dehydrogenase (LDH): Secondary | ICD-10-CM | POA: Diagnosis not present

## 2023-01-27 DIAGNOSIS — K5900 Constipation, unspecified: Secondary | ICD-10-CM | POA: Diagnosis not present

## 2023-01-27 MED ORDER — SODIUM CHLORIDE 0.9 % IV SOLN
75.0000 mg/m2 | Freq: Once | INTRAVENOUS | Status: AC
  Administered 2023-01-27: 126 mg via INTRAVENOUS
  Filled 2023-01-27: qty 12.6

## 2023-01-27 MED ORDER — HEPARIN SOD (PORK) LOCK FLUSH 100 UNIT/ML IV SOLN
500.0000 [IU] | Freq: Once | INTRAVENOUS | Status: AC
  Administered 2023-01-27: 500 [IU] via INTRAVENOUS

## 2023-01-27 MED ORDER — SODIUM CHLORIDE 0.9 % IV SOLN: Freq: Once | INTRAVENOUS | Status: AC

## 2023-01-27 MED ORDER — SODIUM CHLORIDE 0.9% FLUSH
10.0000 mL | Freq: Once | INTRAVENOUS | Status: AC
  Administered 2023-01-27: 10 mL via INTRAVENOUS

## 2023-01-28 ENCOUNTER — Inpatient Hospital Stay: Payer: Medicare HMO

## 2023-01-28 VITALS — BP 113/60 | HR 71 | Temp 97.9°F | Resp 16 | Wt 135.0 lb

## 2023-01-28 DIAGNOSIS — R634 Abnormal weight loss: Secondary | ICD-10-CM | POA: Diagnosis not present

## 2023-01-28 DIAGNOSIS — C9310 Chronic myelomonocytic leukemia not having achieved remission: Secondary | ICD-10-CM

## 2023-01-28 DIAGNOSIS — K5900 Constipation, unspecified: Secondary | ICD-10-CM | POA: Diagnosis not present

## 2023-01-28 DIAGNOSIS — D539 Nutritional anemia, unspecified: Secondary | ICD-10-CM | POA: Diagnosis not present

## 2023-01-28 DIAGNOSIS — R7402 Elevation of levels of lactic acid dehydrogenase (LDH): Secondary | ICD-10-CM | POA: Diagnosis not present

## 2023-01-28 DIAGNOSIS — D696 Thrombocytopenia, unspecified: Secondary | ICD-10-CM | POA: Diagnosis not present

## 2023-01-28 DIAGNOSIS — Z95828 Presence of other vascular implants and grafts: Secondary | ICD-10-CM

## 2023-01-28 DIAGNOSIS — Z5111 Encounter for antineoplastic chemotherapy: Secondary | ICD-10-CM | POA: Diagnosis not present

## 2023-01-28 DIAGNOSIS — R002 Palpitations: Secondary | ICD-10-CM | POA: Diagnosis not present

## 2023-01-28 DIAGNOSIS — E538 Deficiency of other specified B group vitamins: Secondary | ICD-10-CM | POA: Diagnosis not present

## 2023-01-28 MED ORDER — SODIUM CHLORIDE 0.9 % IV SOLN: Freq: Once | INTRAVENOUS | Status: AC

## 2023-01-28 MED ORDER — PALONOSETRON HCL INJECTION 0.25 MG/5ML
0.2500 mg | Freq: Once | INTRAVENOUS | Status: AC
  Administered 2023-01-28: 0.25 mg via INTRAVENOUS
  Filled 2023-01-28: qty 5

## 2023-01-28 MED ORDER — SODIUM CHLORIDE 0.9 % IV SOLN
75.0000 mg/m2 | Freq: Once | INTRAVENOUS | Status: AC
  Administered 2023-01-28: 126 mg via INTRAVENOUS
  Filled 2023-01-28: qty 12.6

## 2023-01-28 MED ORDER — HEPARIN SOD (PORK) LOCK FLUSH 100 UNIT/ML IV SOLN
500.0000 [IU] | Freq: Once | INTRAVENOUS | Status: AC
  Administered 2023-01-28: 500 [IU] via INTRAVENOUS

## 2023-01-28 MED ORDER — SODIUM CHLORIDE 0.9% FLUSH
10.0000 mL | Freq: Once | INTRAVENOUS | Status: AC
  Administered 2023-01-28: 10 mL via INTRAVENOUS

## 2023-01-28 NOTE — Patient Instructions (Signed)
[facility]  Discharge Instructions: Thank you for choosing [facility] to provide your oncology and hematology care.  If you have a lab appointment with the Cancer Center - please note that after April 8th, 2024, all labs will be drawn in the cancer center.  You do not have to check in or register with the main entrance as you have in the past but will complete your check-in in the cancer center.  Wear comfortable clothing and clothing appropriate for easy access to any Portacath or PICC line.   We strive to give you quality time with your provider. You may need to reschedule your appointment if you arrive late (15 or more minutes).  Arriving late affects you and other patients whose appointments are after yours.  Also, if you miss three or more appointments without notifying the office, you may be dismissed from the clinic at the provider's discretion.      For prescription refill requests, have your pharmacy contact our office and allow 72 hours for refills to be completed.    Today you received the following chemotherapy and/or immunotherapy agents Vidaza.  Azacitidine Injection What is this medication? AZACITIDINE (ay za SITE i deen) treats blood and bone marrow cancers. It works by slowing down the growth of cancer cells. This medicine may be used for other purposes; ask your health care provider or pharmacist if you have questions. COMMON BRAND NAME(S): Vidaza What should I tell my care team before I take this medication? They need to know if you have any of these conditions: Kidney disease Liver disease Low blood cell levels, such as low white cells, platelets, or red blood cells Low levels of albumin in the blood Low levels of bicarbonate in the blood An unusual or allergic reaction to azacitidine, mannitol, other medications, foods, dyes, or preservatives If you or your partner are pregnant or trying to get pregnant Breast-feeding How should I  use this medication? This medication is injected into a vein or under the skin. It is given by your care team in a hospital or clinic setting. Talk to your care team about the use of this medication in children. While it may be prescribed for children as young as 1 month for selected conditions, precautions do apply. Overdosage: If you think you have taken too much of this medicine contact a poison control center or emergency room at once. NOTE: This medicine is only for you. Do not share this medicine with others. What if I miss a dose? Keep appointments for follow-up doses. It is important not to miss your dose. Call your care team if you are unable to keep an appointment. What may interact with this medication? Interactions are not expected. This list may not describe all possible interactions. Give your health care provider a list of all the medicines, herbs, non-prescription drugs, or dietary supplements you use. Also tell them if you smoke, drink alcohol, or use illegal drugs. Some items may interact with your medicine. What should I watch for while using this medication? Your condition will be monitored carefully while you are receiving this medication. You may need blood work while taking this medication. This medication may make you feel generally unwell. This is not uncommon as chemotherapy can affect healthy cells as well as cancer cells. Report any side effects. Continue your course of treatment even though you feel ill unless your care team tells you to stop. Other product types may be available that contain the   medication azacitidine. The injection and oral products should not be used in place of one another. Talk to your care team if you have questions. This medication can cause serious side effects. To reduce the risk, your care team may give you other medications to take before receiving this one. Be sure to follow the directions from your care team. This medication may increase your  risk of getting an infection. Call your care team for advice if you get a fever, chills, sore throat, or other symptoms of a cold or flu. Do not treat yourself. Try to avoid being around people who are sick. Avoid taking medications that contain aspirin, acetaminophen, ibuprofen, naproxen, or ketoprofen unless instructed by your care team. These medications may hide a fever. This medication may increase your risk to bruise or bleed. Call your care team if you notice any unusual bleeding. Be careful brushing or flossing your teeth or using a toothpick because you may get an infection or bleed more easily. If you have any dental work done, tell your dentist you are receiving this medication. Talk to your care team if you or your partner may be pregnant. Serious birth defects can occur if you take this medication during pregnancy and for 6 months after the last dose. You will need a negative pregnancy test before starting this medication. Contraception is recommended while taking his medication and for 6 months after the last dose. Your care team can help you find the option that works for you. If your partner can get pregnant, use a condom during sex while taking this medication and for 3 months after the last dose. Do not breastfeed while taking this medication and for 1 week after the last dose. This medication may cause infertility. Talk to your care team if you are concerned about your fertility. What side effects may I notice from receiving this medication? Side effects that you should report to your care team as soon as possible: Allergic reactions--skin rash, itching, hives, swelling of the face, lips, tongue, or throat Infection--fever, chills, cough, sore throat, wounds that don't heal, pain or trouble when passing urine, general feeling of discomfort or being unwell Kidney injury--decrease in the amount of urine, swelling of the ankles, hands, or feet Liver injury--right upper belly pain, loss  of appetite, nausea, light-colored stool, dark yellow or brown urine, yellowing skin or eyes, unusual weakness or fatigue Low red blood cell level--unusual weakness or fatigue, dizziness, headache, trouble breathing Tumor lysis syndrome (TLS)--nausea, vomiting, diarrhea, decrease in the amount of urine, dark urine, unusual weakness or fatigue, confusion, muscle pain or cramps, fast or irregular heartbeat, joint pain Unusual bruising or bleeding Side effects that usually do not require medical attention (report to your care team if they continue or are bothersome): Constipation Diarrhea Nausea Pain, redness, or irritation at injection site Vomiting This list may not describe all possible side effects. Call your doctor for medical advice about side effects. You may report side effects to FDA at 1-800-FDA-1088. Where should I keep my medication? This medication is given in a hospital or clinic. It will not be stored at home. NOTE: This sheet is a summary. It may not cover all possible information. If you have questions about this medicine, talk to your doctor, pharmacist, or health care provider.  2024 Elsevier/Gold Standard (2021-11-19 00:00:00)        To help prevent nausea and vomiting after your treatment, we encourage you to take your nausea medication as directed.  BELOW ARE SYMPTOMS   THAT SHOULD BE REPORTED IMMEDIATELY: *FEVER GREATER THAN 100.4 F (38 C) OR HIGHER *CHILLS OR SWEATING *NAUSEA AND VOMITING THAT IS NOT CONTROLLED WITH YOUR NAUSEA MEDICATION *UNUSUAL SHORTNESS OF BREATH *UNUSUAL BRUISING OR BLEEDING *URINARY PROBLEMS (pain or burning when urinating, or frequent urination) *BOWEL PROBLEMS (unusual diarrhea, constipation, pain near the anus) TENDERNESS IN MOUTH AND THROAT WITH OR WITHOUT PRESENCE OF ULCERS (sore throat, sores in mouth, or a toothache) UNUSUAL RASH, SWELLING OR PAIN  UNUSUAL VAGINAL DISCHARGE OR ITCHING   Items with * indicate a potential emergency and  should be followed up as soon as possible or go to the Emergency Department if any problems should occur.  Please show the CHEMOTHERAPY ALERT CARD or IMMUNOTHERAPY ALERT CARD at check-in to the Emergency Department and triage nurse.  Should you have questions after your visit or need to cancel or reschedule your appointment, please contact [facility] [phone]  and follow the prompts.  Office hours are 8:00 a.m. to 4:30 p.m. Monday - Friday. Please note that voicemails left after 4:00 p.m. may not be returned until the following business day.  We are closed weekends and major holidays. You have access to a nurse at all times for urgent questions. Please call the main number to the clinic [phone] and follow the prompts.  For any non-urgent questions, you may also contact your provider using MyChart. We now offer e-Visits for anyone 18 and older to request care online for non-urgent symptoms. For details visit [email or web address].   Also download the MyChart app! Go to the app store, search "MyChart", open the app, select [place], and log in with your MyChart username and password.   

## 2023-01-28 NOTE — Progress Notes (Signed)
Patient presents today for Vidaza chemotherapy infusion.  Patient is in satisfactory condition with no new complaints voiced.  Vital signs are stable.  Labs reviewed and all labs are within treatment parameters.  We will proceed with treatment per MD orders.    Patient tolerated treatment well with no complaints voiced.  Patient left ambulatory in stable condition.  Vital signs stable at discharge.  Follow up as scheduled.

## 2023-01-29 ENCOUNTER — Other Ambulatory Visit: Payer: Self-pay

## 2023-01-29 ENCOUNTER — Other Ambulatory Visit: Payer: Self-pay | Admitting: Hematology

## 2023-01-29 DIAGNOSIS — G4700 Insomnia, unspecified: Secondary | ICD-10-CM

## 2023-01-31 ENCOUNTER — Inpatient Hospital Stay: Payer: Medicare HMO

## 2023-01-31 ENCOUNTER — Encounter: Payer: Self-pay | Admitting: Hematology

## 2023-02-01 ENCOUNTER — Inpatient Hospital Stay: Payer: Medicare HMO

## 2023-02-07 ENCOUNTER — Ambulatory Visit: Payer: Medicare HMO

## 2023-02-08 ENCOUNTER — Telehealth: Payer: Self-pay | Admitting: Podiatry

## 2023-02-08 ENCOUNTER — Encounter: Payer: Self-pay | Admitting: Podiatry

## 2023-02-08 NOTE — Telephone Encounter (Signed)
Has been apply antifungal medication to nails since last apt in May, is not seeing any change in nails. Still dealing with fungus if you can advise with further treatment options.

## 2023-02-08 NOTE — Telephone Encounter (Signed)
Reach out via MyChart or call cell if reaching out today before 5 any other time contact with home line.

## 2023-02-09 ENCOUNTER — Ambulatory Visit (INDEPENDENT_AMBULATORY_CARE_PROVIDER_SITE_OTHER): Payer: Medicare HMO

## 2023-02-09 DIAGNOSIS — J4550 Severe persistent asthma, uncomplicated: Secondary | ICD-10-CM | POA: Diagnosis not present

## 2023-02-22 ENCOUNTER — Other Ambulatory Visit: Payer: Self-pay | Admitting: Podiatry

## 2023-02-22 ENCOUNTER — Other Ambulatory Visit: Payer: Self-pay | Admitting: Hematology

## 2023-02-26 ENCOUNTER — Other Ambulatory Visit: Payer: Self-pay

## 2023-02-28 ENCOUNTER — Inpatient Hospital Stay: Payer: Medicare HMO

## 2023-02-28 ENCOUNTER — Inpatient Hospital Stay: Payer: Medicare HMO | Admitting: Hematology

## 2023-03-01 ENCOUNTER — Inpatient Hospital Stay: Payer: Medicare HMO

## 2023-03-02 ENCOUNTER — Inpatient Hospital Stay: Payer: Medicare HMO

## 2023-03-02 ENCOUNTER — Ambulatory Visit: Payer: Medicare HMO

## 2023-03-03 ENCOUNTER — Inpatient Hospital Stay: Payer: Medicare HMO

## 2023-03-04 ENCOUNTER — Inpatient Hospital Stay: Payer: Medicare HMO

## 2023-03-07 ENCOUNTER — Inpatient Hospital Stay: Payer: Medicare HMO

## 2023-03-07 ENCOUNTER — Inpatient Hospital Stay (HOSPITAL_BASED_OUTPATIENT_CLINIC_OR_DEPARTMENT_OTHER): Payer: Medicare HMO | Admitting: Hematology

## 2023-03-07 ENCOUNTER — Inpatient Hospital Stay: Payer: Medicare HMO | Attending: Hematology

## 2023-03-07 VITALS — BP 110/81

## 2023-03-07 VITALS — BP 105/65 | HR 72 | Temp 96.6°F | Resp 18

## 2023-03-07 DIAGNOSIS — Z83518 Family history of other specified eye disorder: Secondary | ICD-10-CM | POA: Diagnosis not present

## 2023-03-07 DIAGNOSIS — K5900 Constipation, unspecified: Secondary | ICD-10-CM | POA: Diagnosis not present

## 2023-03-07 DIAGNOSIS — D696 Thrombocytopenia, unspecified: Secondary | ICD-10-CM | POA: Diagnosis not present

## 2023-03-07 DIAGNOSIS — Z800 Family history of malignant neoplasm of digestive organs: Secondary | ICD-10-CM | POA: Insufficient documentation

## 2023-03-07 DIAGNOSIS — Z833 Family history of diabetes mellitus: Secondary | ICD-10-CM | POA: Diagnosis not present

## 2023-03-07 DIAGNOSIS — C9310 Chronic myelomonocytic leukemia not having achieved remission: Secondary | ICD-10-CM | POA: Diagnosis not present

## 2023-03-07 DIAGNOSIS — Z5111 Encounter for antineoplastic chemotherapy: Secondary | ICD-10-CM | POA: Insufficient documentation

## 2023-03-07 DIAGNOSIS — Z8249 Family history of ischemic heart disease and other diseases of the circulatory system: Secondary | ICD-10-CM | POA: Insufficient documentation

## 2023-03-07 DIAGNOSIS — Z8379 Family history of other diseases of the digestive system: Secondary | ICD-10-CM | POA: Insufficient documentation

## 2023-03-07 DIAGNOSIS — Z79899 Other long term (current) drug therapy: Secondary | ICD-10-CM | POA: Diagnosis not present

## 2023-03-07 DIAGNOSIS — Z882 Allergy status to sulfonamides status: Secondary | ICD-10-CM | POA: Diagnosis not present

## 2023-03-07 DIAGNOSIS — E538 Deficiency of other specified B group vitamins: Secondary | ICD-10-CM | POA: Diagnosis not present

## 2023-03-07 DIAGNOSIS — Z818 Family history of other mental and behavioral disorders: Secondary | ICD-10-CM | POA: Insufficient documentation

## 2023-03-07 DIAGNOSIS — Z881 Allergy status to other antibiotic agents status: Secondary | ICD-10-CM | POA: Diagnosis not present

## 2023-03-07 DIAGNOSIS — I509 Heart failure, unspecified: Secondary | ICD-10-CM | POA: Insufficient documentation

## 2023-03-07 DIAGNOSIS — Z9089 Acquired absence of other organs: Secondary | ICD-10-CM | POA: Insufficient documentation

## 2023-03-07 DIAGNOSIS — R197 Diarrhea, unspecified: Secondary | ICD-10-CM | POA: Insufficient documentation

## 2023-03-07 DIAGNOSIS — D539 Nutritional anemia, unspecified: Secondary | ICD-10-CM | POA: Insufficient documentation

## 2023-03-07 DIAGNOSIS — R634 Abnormal weight loss: Secondary | ICD-10-CM | POA: Diagnosis not present

## 2023-03-07 DIAGNOSIS — K219 Gastro-esophageal reflux disease without esophagitis: Secondary | ICD-10-CM | POA: Insufficient documentation

## 2023-03-07 DIAGNOSIS — Z95828 Presence of other vascular implants and grafts: Secondary | ICD-10-CM

## 2023-03-07 DIAGNOSIS — Z825 Family history of asthma and other chronic lower respiratory diseases: Secondary | ICD-10-CM | POA: Diagnosis not present

## 2023-03-07 LAB — COMPREHENSIVE METABOLIC PANEL
ALT: 14 U/L (ref 0–44)
AST: 29 U/L (ref 15–41)
Albumin: 4.3 g/dL (ref 3.5–5.0)
Alkaline Phosphatase: 81 U/L (ref 38–126)
Anion gap: 7 (ref 5–15)
BUN: 14 mg/dL (ref 8–23)
CO2: 25 mmol/L (ref 22–32)
Calcium: 9 mg/dL (ref 8.9–10.3)
Chloride: 103 mmol/L (ref 98–111)
Creatinine, Ser: 0.73 mg/dL (ref 0.44–1.00)
GFR, Estimated: >60 mL/min (ref >60)
Glucose, Bld: 92 mg/dL (ref 70–99)
Potassium: 4.2 mmol/L (ref 3.5–5.1)
Sodium: 135 mmol/L (ref 135–145)
Total Bilirubin: 1.7 mg/dL — ABNORMAL HIGH (ref 0.3–1.2)
Total Protein: 7.4 g/dL (ref 6.5–8.1)

## 2023-03-07 LAB — CBC WITH DIFFERENTIAL/PLATELET
Abs Immature Granulocytes: 0.4 10*3/uL — ABNORMAL HIGH (ref 0.00–0.07)
Basophils Absolute: 0.1 10*3/uL (ref 0.0–0.1)
Basophils Relative: 1 %
Eosinophils Absolute: 0.1 10*3/uL (ref 0.0–0.5)
Eosinophils Relative: 4 %
HCT: 28.7 % — ABNORMAL LOW (ref 36.0–46.0)
Hemoglobin: 8.8 g/dL — ABNORMAL LOW (ref 12.0–15.0)
Immature Granulocytes: 11 %
Lymphocytes Relative: 15 %
Lymphs Abs: 0.6 10*3/uL — ABNORMAL LOW (ref 0.7–4.0)
MCH: 29.6 pg (ref 26.0–34.0)
MCHC: 30.7 g/dL (ref 30.0–36.0)
MCV: 96.6 fL (ref 80.0–100.0)
Monocytes Absolute: 0.4 10*3/uL (ref 0.1–1.0)
Monocytes Relative: 11 %
Neutro Abs: 2.2 10*3/uL (ref 1.7–7.7)
Neutrophils Relative %: 58 %
Platelets: 117 10*3/uL — ABNORMAL LOW (ref 150–400)
RBC: 2.97 MIL/uL — ABNORMAL LOW (ref 3.87–5.11)
RDW: 20.6 % — ABNORMAL HIGH (ref 11.5–15.5)
WBC: 3.8 10*3/uL — ABNORMAL LOW (ref 4.0–10.5)
nRBC: 2.9 % — ABNORMAL HIGH (ref 0.0–0.2)

## 2023-03-07 LAB — MAGNESIUM: Magnesium: 2.1 mg/dL (ref 1.7–2.4)

## 2023-03-07 LAB — LACTATE DEHYDROGENASE: LDH: 704 U/L — ABNORMAL HIGH (ref 98–192)

## 2023-03-07 MED ORDER — SODIUM CHLORIDE 0.9 % IV SOLN
75.0000 mg/m2 | Freq: Once | INTRAVENOUS | Status: AC
  Administered 2023-03-07: 126 mg via INTRAVENOUS
  Filled 2023-03-07: qty 12.6

## 2023-03-07 MED ORDER — HEPARIN SOD (PORK) LOCK FLUSH 100 UNIT/ML IV SOLN
500.0000 [IU] | Freq: Once | INTRAVENOUS | Status: AC
  Administered 2023-03-07: 500 [IU] via INTRAVENOUS

## 2023-03-07 MED ORDER — PALONOSETRON HCL INJECTION 0.25 MG/5ML
0.2500 mg | Freq: Once | INTRAVENOUS | Status: AC
  Administered 2023-03-07: 0.25 mg via INTRAVENOUS
  Filled 2023-03-07: qty 5

## 2023-03-07 MED ORDER — SODIUM CHLORIDE 0.9 % IV SOLN: Freq: Once | INTRAVENOUS | Status: AC

## 2023-03-07 MED ORDER — SODIUM CHLORIDE 0.9% FLUSH
10.0000 mL | Freq: Once | INTRAVENOUS | Status: AC
  Administered 2023-03-07: 10 mL via INTRAVENOUS

## 2023-03-07 NOTE — Progress Notes (Signed)
[facility] [address] [place] [address]    Clinic Day:  03/07/2023  Referring physician: Carylon Perches, MD  Patient Care Team: Carylon Perches, MD as PCP - General (Internal Medicine) Jena Gauss Gerrit Friends, MD as Attending Physician (Gastroenterology) Storm Frisk, MD as Consulting Physician (Pulmonary Disease) Doreatha Massed, MD as Medical Oncologist (Hematology)   ASSESSMENT & PLAN:   Assessment: 1.  Macrocytic anemia and thrombocytopenia: - Patient seen at the request of Dr. Ouida Sills for abnormal CBC. - 04/17/2022: WBC 15.8, Hb-9.7, PLT-128 - 03/24/2022: WBC-8.1 (N-43%, L-17%, M-19%, B-2%), Hb-9.6, MCV-99, PLT-115 - Smear review: 13% metamyelocytes, 2% myelocytes, 2% blasts,  - 10/19/2021: WBC-5.3 (N-61, L-21, M-10%), Hb-11, MCV-96, PLT-97 - BMBX (05/25/2022): Hypercellular bone marrow with myeloid hyperplasia with dysgranulopoiesis, erythroid hypoplasia and megakaryocytic hyperplasia with dyspoiesis.  Chromosome analysis was 34, XX.  Bone marrow blasts less than 5%.  Peripheral blood blasts less than 2%. - Mayo molecular model risk stratification: Intermediate 2 risk with at least 2 points (decreased hemoglobin less than 10, circulating immature cells).  Intermediate 2 risk with median OS 31 months. - Serum EPO level 41. - NGS: Positive for CBL, MPL, SRSF2, TET 2, RAD21 - PET scan (07/08/2022): Splenomegaly (volume 510 mm) with normal metabolic activity.  Uniform increase in marrow metabolic activity related to patient's anemia.  No lymphadenopathy. - BMBX (07/16/2022): Hypercellular bone marrow (95-100%) with myeloid hyperplasia, atypical monocytes, increased blasts (16% overall).  Atypical monocytosis present 23% of total events, expressing HLA-DR, CD38, CD4 dim, CD11C, CD13, CD14, CD36, CD64 with aberrant coexpression of CD56 and CD7. - Cycle 1 of azacitidine on 08/02/2022, cycle 2 on 08/30/2022.  Azacitidine decreased to 5 days starting cycle 5 on 11/22/2022 - BMBX  (09/21/2022): 3% blasts, hypercellular marrow with markedly increased dyspoietic megakaryocytes, increased fibrosis - BMBX (11/16/2022): Hypercellular marrow (70-80%) with marked GIST [name] (, myelofibrosis and 3% blasts on a very limited sample.   2.  Social/family history: - She lives at home by herself.  Son lives next door. - Does part-time work at [employer].  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  Non-smoker. - Maternal aunt had tumor in the breast, patient not certain if it is cancer.  2 maternal first cousins had lymphoma.  Mother had stomach cancer.    Plan: 1.  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 - Denies any infections or GI side effects. - Reviewed labs today: Total bilirubin 1.7.  Rest of LFTs and creatinine normal.  CBC with mild leukopenia with normal ANC.  Platelet count is 117 and hemoglobin 8.8. - LDH is 704, down from 900. - Recommend proceeding with cycle 8 azacitidine x 5 days. - Continue ciclopirox for toenail fungus. - RTC 5 weeks for follow-up.  She has a follow-up with Dr. Lowell Guitar in September.   2.  Folate deficiency: - Continue folic acid tablet daily.   3.  Weight loss: - She is not requiring Megace.  Weight is stable.   4.  Constipation: - Continue Colace daily and use lactulose as needed.  Well-controlled.    No orders of the defined types were placed in this encounter.     Alben Deeds Teague,acting as a Neurosurgeon for Doreatha Massed, MD.,have documented all relevant documentation on the behalf of Doreatha Massed, MD,as directed by  Doreatha Massed, MD while in the presence of Doreatha Massed, MD.  I, Doreatha Massed MD, have  reviewed the above documentation for accuracy and completeness, and I agree with the above.     Doreatha Massed, MD   8/19/20244:45 PM  CHIEF COMPLAINT:   Diagnosis: CMML-2    Cancer Staging  No matching  staging information was found for the patient.    Prior Therapy: none  Current Therapy:  Azacitidine 70 mg/m x 7 days every 28 days    HISTORY OF PRESENT ILLNESS:   Oncology History  CMML (chronic myelomonocytic leukemia) (HCC)  06/03/2022 Initial Diagnosis   CMML (chronic myelomonocytic leukemia) (HCC)   08/02/2022 -  Chemotherapy   Patient is on Treatment Plan : MYELODYSPLASIA  Azacitidine IVPB D1-7 q28d        INTERVAL HISTORY:   [name] is a 72 y.o. female presenting to clinic today for follow up of CMML-2. She was last seen by me on 01/24/23.  Today, she states that she is doing well overall. Her appetite level is at 50%. Her energy level is at 100%.    PAST MEDICAL HISTORY:   Past Medical History: Past Medical History:  Diagnosis Date   Anxiety    Asthma    had 1 episode 1 year ago-no more problem   CHF (congestive heart failure) (HCC)    swelling of feet & ankles   CMML (chronic myelomonocytic leukemia) (HCC) 06/03/2022   Cough    Depression    OCD   GERD (gastroesophageal reflux disease)    severe   OCD (obsessive compulsive disorder)    Port-A-Cath in place 07/26/2022   Shortness of breath    with exertion   Yeast infection 04/09/2014    Surgical History: Past Surgical History:  Procedure Laterality Date   CARPAL TUNNEL RELEASE  03/22/2012   Procedure: CARPAL TUNNEL RELEASE;  Surgeon: Nicki Reaper, MD;  Location: [facility];  Service: Orthopedics;  Laterality: Right;  right carpal tunnel release   COLONOSCOPY  06/19/2012   Dr. Rourk:normal rectum and colon    ESOPHAGOGASTRODUODENOSCOPY N/A 12/26/2015   Dr. Jena Gauss: normal    IR IMAGING GUIDED PORT INSERTION  07/30/2022   TONSILLECTOMY     TUBAL LIGATION      Social History: Social History   Socioeconomic History   Marital status: Widowed    Spouse name: Not on file   Number of children: 1   Years of education: Not on file   Highest education level: Not on file  Occupational History    Occupation: retired  Tobacco Use   Smoking status: Never    Passive exposure: Yes   Smokeless tobacco: Never  Vaping Use   Vaping status: Never Used  Substance and Sexual Activity   Alcohol use: No   Drug use: No   Sexual activity: Yes    Birth control/protection: Surgical    Comment: tubal  Other Topics Concern   Not on file  Social History Narrative   Not on file   Social Determinants of Health   Financial Resource Strain: Not on file  Food Insecurity: Not on file  Transportation Needs: Not on file  Physical Activity: Not on file  Stress: Not on file  Social Connections: Not on file  Intimate Partner Violence: Not on file    Family History: Family History  Problem Relation Age of Onset   Asthma Mother    Macular degeneration Mother    Hypertension Mother    Stomach cancer Mother 39   Alzheimer's disease Father    Other Sister  blood clots   Diabetes Maternal Grandfather    Diabetes Paternal Grandfather    Other Son        enlarged spleen   Lymphoma Cousin        x2, both dx in their 28s   Colon cancer Neg Hx     Current Medications:  Current Outpatient Medications:    albuterol (VENTOLIN HFA) 108 (90 Base) MCG/ACT inhaler, INHALE 2 PUFFS BY MOUTH EVERY 6 HOURS AS NEEDED FOR SHORTNESS OF BREATH OR WHEEZING., Disp: 18 g, Rfl: 2   albuterol (VENTOLIN HFA) 108 (90 Base) MCG/ACT inhaler, Inhale 2 puffs into the lungs every 4 (four) hours as needed for wheezing or shortness of breath., Disp: 18 g, Rfl: 1   ARNUITY ELLIPTA 200 MCG/ACT AEPB, Inhale 1 puff into the lungs daily., Disp: 30 each, Rfl: 5   azaCITIDine 5 mg/2 mLs in lactated ringers infusion, Inject into the vein daily. Days 1-7 every 28 days, Disp: , Rfl:    azelastine (ASTELIN) 0.1 % nasal spray, SPRAY 1 TO 2 SPRAYS PER NOSTRIL TWICE DAILY., Disp: 30 mL, Rfl: 3   benzonatate (TESSALON) 200 MG capsule, Take 200 mg by mouth 3 (three) times daily as needed., Disp: , Rfl:    BREZTRI AEROSPHERE  160-9-4.8 MCG/ACT AERO, Inhale 2 puffs into the lungs 2 (two) times daily., Disp: 10.7 g, Rfl: 5   cetirizine (ZYRTEC) 5 MG tablet, Take 1 tablet (5 mg total) by mouth daily., Disp: 30 tablet, Rfl: 0   ciclopirox (PENLAC) 8 % solution, APPLY TOPICALLY AT BEDTIME. APPLY OVER NAIL FOLD AND SURROUNDING SKIN. APPLY DAILY OVER PREVIOUS COAT. AFTER SEVEN DAYS, MAY REMOVE WITH ALCOHOL AND CONTINUE CYCLE., Disp: 6.6 mL, Rfl: 2   EPINEPHrine 0.3 mg/0.3 mL IJ SOAJ injection, Inject 0.3 mg into the muscle as needed for anaphylaxis., Disp: 2 each, Rfl: 1   famotidine (PEPCID) 20 MG tablet, Take 1 tablet (20 mg total) by mouth 2 (two) times daily., Disp: 60 tablet, Rfl: 5   FLUoxetine (PROZAC) 20 MG capsule, Take 1 capsule by mouth daily., Disp: , Rfl:    fluticasone (FLONASE) 50 MCG/ACT nasal spray, Place 2 sprays into both nostrils 2 (two) times daily., Disp: 16 g, Rfl: 5   fluticasone (FLOVENT HFA) 110 MCG/ACT inhaler, Inhale two puffs twice daily to prevent cough or wheeze. Rinse mouth after use., Disp: 12 g, Rfl: 5   folic acid (FOLVITE) 1 MG tablet, TAKE ONE TABLET BY MOUTH ONCE DAILY, Disp: 30 tablet, Rfl: 5   gabapentin (NEURONTIN) 300 MG capsule, , Disp: , Rfl:    ipratropium (ATROVENT) 0.06 % nasal spray, USE 2 SPRAYS IN EACH NOSTRIL THREE TIMES A DAY AS NEEDED., Disp: 15 mL, Rfl: 5   lactulose (CHRONULAC) 10 GM/15ML solution, take 30ml BY MOUTH EVERY THREE HOURS UNTIL bowel movement; THEN take 30ml ONCE DAILY AS NEEDED FOR CONSTIPATION, Disp: 450 mL, Rfl: 2   lidocaine-prilocaine (EMLA) cream, Apply a quarter sized amount to port a cath site and cover with plastic wrap one hour prior to infusion appointments, Disp: 30 g, Rfl: 3   megestrol (MEGACE) 400 MG/10ML suspension, Take 10 mLs (400 mg total) by mouth 2 (two) times daily., Disp: 480 mL, Rfl: 2   montelukast (SINGULAIR) 10 MG tablet, Take 1 tablet (10 mg total) by mouth at bedtime., Disp: 30 tablet, Rfl: 5   NON FORMULARY, [facility]   Antifungal (nail)-#1, Disp: , Rfl:    pantoprazole (PROTONIX) 40 MG tablet, Take 1 tablet (40 mg  total) by mouth daily., Disp: 30 tablet, Rfl: 5   prochlorperazine (COMPAZINE) 10 MG tablet, Take 1 tablet (10 mg total) by mouth every 6 (six) hours as needed for nausea or vomiting., Disp: 30 tablet, Rfl: 0   Respiratory Therapy Supplies (FLUTTER) DEVI, Use as directed, Disp: 1 each, Rfl: 3   SF 5000 PLUS 1.1 % CREA dental cream, , Disp: , Rfl:    Spacer/Aero-Holding Chambers (AEROCHAMBER PLUS WITH MASK) inhaler, 1 each by Other route See admin instructions. Use with inhaler as instructed., Disp: 1 each, Rfl: 1   traZODone (DESYREL) 50 MG tablet, TAKE ONE TABLET BY MOUTH AT BEDTIME, Disp: 30 tablet, Rfl: 1  Current Facility-Administered Medications:    tezepelumab-ekko (TEZSPIRE) 210 MG/1. syringe 210 mg, 210 mg, Subcutaneous, Q28 days, Marcelyn Bruins, MD, 210 mg at 02/09/23 1103   Allergies: Allergies  Allergen Reactions   Levonorgestrel-Ethinyl Estrad Cough   Sulfa Antibiotics Other (See Comments)    Unknown- pt unsure of reaction; believes it may be nausea   Sulfamethoxazole-Trimethoprim Other (See Comments)   Cephalosporins Other (See Comments)    Other Reaction: Toxicity    REVIEW OF SYSTEMS:   Review of Systems  Constitutional:  Negative for chills, fatigue and fever.  HENT:   Negative for lump/mass, mouth sores, nosebleeds, sore throat and trouble swallowing.   Eyes:  Negative for eye problems.  Respiratory:  Negative for cough and shortness of breath.   Cardiovascular:  Negative for chest pain, leg swelling and palpitations.  Gastrointestinal:  Positive for constipation and diarrhea. Negative for abdominal pain, nausea and vomiting.  Genitourinary:  Negative for bladder incontinence, difficulty urinating, dysuria, frequency, hematuria and nocturia.   Musculoskeletal:  Negative for arthralgias, back pain, flank pain, myalgias and neck pain.  Skin:  Negative for  itching and rash.  Neurological:  Negative for dizziness, headaches and numbness.  Hematological:  Does not bruise/bleed easily.  Psychiatric/Behavioral:  Negative for depression, sleep disturbance and suicidal ideas. The patient is not nervous/anxious.   All other systems reviewed and are negative.    VITALS:   Blood pressure 110/81.  Wt Readings from Last 3 Encounters:  03/07/23 133 lb 9.6 oz (60.6 kg)  01/28/23 135 lb (61.2 kg)  01/24/23 135 lb 3.2 oz (61.3 kg)    There is no height or weight on file to calculate BMI.  Performance status (ECOG): 1 - Symptomatic but completely ambulatory  PHYSICAL EXAM:   Physical Exam Vitals and nursing note reviewed. Exam conducted with a chaperone present.  Constitutional:      Appearance: Normal appearance.  Cardiovascular:     Rate and Rhythm: Normal rate and regular rhythm.     Pulses: Normal pulses.     Heart sounds: Normal heart sounds.  Pulmonary:     Effort: Pulmonary effort is normal.     Breath sounds: Normal breath sounds.  Abdominal:     Palpations: Abdomen is soft. There is no hepatomegaly, splenomegaly or mass.     Tenderness: There is no abdominal tenderness.  Musculoskeletal:     Right lower leg: No edema.     Left lower leg: No edema.  Lymphadenopathy:     Cervical: No cervical adenopathy.     Right cervical: No superficial, deep or posterior cervical adenopathy.    Left cervical: No superficial, deep or posterior cervical adenopathy.     Upper Body:     Right upper body: No supraclavicular or axillary adenopathy.     Left upper body: No  supraclavicular or axillary adenopathy.  Neurological:     General: No focal deficit present.     Mental Status: She is alert and oriented to person, place, and time.  Psychiatric:        Mood and Affect: Mood normal.        Behavior: Behavior normal.     LABS:      Latest Ref Rng & Units 03/07/2023   11:11 AM 01/24/2023   11:56 AM 12/20/2022   10:48 AM  CBC  WBC 4.0 -  10.5 K/uL 3.8  3.5  3.4   Hemoglobin 12.0 - 15.0 g/dL 8.8  8.6  8.7   Hematocrit 36.0 - 46.0 % 28.7  28.1  27.8   Platelets 150 - 400 K/uL 117  144  178       Latest Ref Rng & Units 03/07/2023   11:25 AM 01/24/2023   11:56 AM 12/20/2022   10:48 AM  CMP  Glucose 70 - 99 mg/dL 92  96  94   BUN 8 - 23 mg/dL 14  12  11    Creatinine 0.44 - 1.00 mg/dL 1.61  0.96  0.45   Sodium 135 - 145 mmol/L 135  135  135   Potassium 3.5 - 5.1 mmol/L 4.2  4.1  3.8   Chloride 98 - 111 mmol/L 103  103  103   CO2 22 - 32 mmol/L 25  26  26    Calcium 8.9 - 10.3 mg/dL 9.0  9.0  8.9   Total Protein 6.5 - 8.1 g/dL 7.4  7.1  6.8   Total Bilirubin 0.3 - 1.2 mg/dL 1.7  1.3  1.2   Alkaline Phos 38 - 126 U/L 81  71  56   AST 15 - 41 U/L 29  30  28    ALT 0 - 44 U/L 14  13  11       No results found for: "CEA1", "CEA" / No results found for: "CEA1", "CEA" No results found for: "PSA1" No results found for: "CAN199" No results found for: "CAN125"  Lab Results  Component Value Date   TOTALPROTELP 6.3 04/26/2022   ALBUMINELP 3.8 04/26/2022   A1GS 0.3 04/26/2022   A2GS 0.5 04/26/2022   BETS 0.8 04/26/2022   GAMS 0.9 04/26/2022   MSPIKE Not Observed 04/26/2022   SPEI Comment 04/26/2022   Lab Results  Component Value Date   TIBC 343 04/26/2022   FERRITIN 114 04/26/2022   IRONPCTSAT 25 04/26/2022   Lab Results  Component Value Date   LDH 704 (H) 03/07/2023   LDH 900 (H) 01/24/2023   LDH 842 (H) 12/20/2022     STUDIES:   No results found.

## 2023-03-07 NOTE — Patient Instructions (Signed)
[facility] [place]  Discharge Instructions: Thank you for choosing [facility] to provide your oncology and hematology care.  If you have a lab appointment with the Cancer Center - please note that after April 8th, 2024, all labs will be drawn in the cancer center.  You do not have to check in or register with the main entrance as you have in the past but will complete your check-in in the cancer center.  Wear comfortable clothing and clothing appropriate for easy access to any Portacath or PICC line.   We strive to give you quality time with your provider. You may need to reschedule your appointment if you arrive late (15 or more minutes).  Arriving late affects you and other patients whose appointments are after yours.  Also, if you miss three or more appointments without notifying the office, you may be dismissed from the clinic at the provider's discretion.      For prescription refill requests, have your pharmacy contact our office and allow 72 hours for refills to be completed.    Today you received the following chemotherapy and/or immunotherapy agents D1 Vidaza   To help prevent nausea and vomiting after your treatment, we encourage you to take your nausea medication as directed.  Azacitidine Injection What is this medication? AZACITIDINE (ay za SITE i deen) treats blood and bone marrow cancers. It works by slowing down the growth of cancer cells. This medicine may be used for other purposes; ask your health care provider or pharmacist if you have questions. COMMON BRAND NAME(S): Vidaza What should I tell my care team before I take this medication? They need to know if you have any of these conditions: Kidney disease Liver disease Low blood cell levels, such as low white cells, platelets, or red blood cells Low levels of albumin in the blood Low levels of bicarbonate in the blood An unusual or allergic reaction to azacitidine, mannitol, other  medications, foods, dyes, or preservatives If you or your partner are pregnant or trying to get pregnant Breast-feeding How should I use this medication? This medication is injected into a vein or under the skin. It is given by your care team in a hospital or clinic setting. Talk to your care team about the use of this medication in children. While it may be prescribed for children as young as 1 month for selected conditions, precautions do apply. Overdosage: If you think you have taken too much of this medicine contact a poison control center or emergency room at once. NOTE: This medicine is only for you. Do not share this medicine with others. What if I miss a dose? Keep appointments for follow-up doses. It is important not to miss your dose. Call your care team if you are unable to keep an appointment. What may interact with this medication? Interactions are not expected. This list may not describe all possible interactions. Give your health care provider a list of all the medicines, herbs, non-prescription drugs, or dietary supplements you use. Also tell them if you smoke, drink alcohol, or use illegal drugs. Some items may interact with your medicine. What should I watch for while using this medication? Your condition will be monitored carefully while you are receiving this medication. You may need blood work while taking this medication. This medication may make you feel generally unwell. This is not uncommon as chemotherapy can affect healthy cells as well as cancer cells. Report any side effects. Continue your course of treatment   even though you feel ill unless your care team tells you to stop. Other product types may be available that contain the medication azacitidine. The injection and oral products should not be used in place of one another. Talk to your care team if you have questions. This medication can cause serious side effects. To reduce the risk, your care team may give you other  medications to take before receiving this one. Be sure to follow the directions from your care team. This medication may increase your risk of getting an infection. Call your care team for advice if you get a fever, chills, sore throat, or other symptoms of a cold or flu. Do not treat yourself. Try to avoid being around people who are sick. Avoid taking medications that contain aspirin, acetaminophen, ibuprofen, naproxen, or ketoprofen unless instructed by your care team. These medications may hide a fever. This medication may increase your risk to bruise or bleed. Call your care team if you notice any unusual bleeding. Be careful brushing or flossing your teeth or using a toothpick because you may get an infection or bleed more easily. If you have any dental work done, tell your dentist you are receiving this medication. Talk to your care team if you or your partner may be pregnant. Serious birth defects can occur if you take this medication during pregnancy and for 6 months after the last dose. You will need a negative pregnancy test before starting this medication. Contraception is recommended while taking his medication and for 6 months after the last dose. Your care team can help you find the option that works for you. If your partner can get pregnant, use a condom during sex while taking this medication and for 3 months after the last dose. Do not breastfeed while taking this medication and for 1 week after the last dose. This medication may cause infertility. Talk to your care team if you are concerned about your fertility. What side effects may I notice from receiving this medication? Side effects that you should report to your care team as soon as possible: Allergic reactions--skin rash, itching, hives, swelling of the face, lips, tongue, or throat Infection--fever, chills, cough, sore throat, wounds that don't heal, pain or trouble when passing urine, general feeling of discomfort or being  unwell Kidney injury--decrease in the amount of urine, swelling of the ankles, hands, or feet Liver injury--right upper belly pain, loss of appetite, nausea, light-colored stool, dark yellow or brown urine, yellowing skin or eyes, unusual weakness or fatigue Low red blood cell level--unusual weakness or fatigue, dizziness, headache, trouble breathing Tumor lysis syndrome (TLS)--nausea, vomiting, diarrhea, decrease in the amount of urine, dark urine, unusual weakness or fatigue, confusion, muscle pain or cramps, fast or irregular heartbeat, joint pain Unusual bruising or bleeding Side effects that usually do not require medical attention (report to your care team if they continue or are bothersome): Constipation Diarrhea Nausea Pain, redness, or irritation at injection site Vomiting This list may not describe all possible side effects. Call your doctor for medical advice about side effects. You may report side effects to FDA at 1-800-FDA-1088. Where should I keep my medication? This medication is given in a hospital or clinic. It will not be stored at home. NOTE: This sheet is a summary. It may not cover all possible information. If you have questions about this medicine, talk to your doctor, pharmacist, or health care provider.  2024 Elsevier/Gold Standard (2021-11-19 00:00:00)   BELOW ARE SYMPTOMS THAT SHOULD BE   REPORTED IMMEDIATELY: *FEVER GREATER THAN 100.4 F (38 C) OR HIGHER *CHILLS OR SWEATING *NAUSEA AND VOMITING THAT IS NOT CONTROLLED WITH YOUR NAUSEA MEDICATION *UNUSUAL SHORTNESS OF BREATH *UNUSUAL BRUISING OR BLEEDING *URINARY PROBLEMS (pain or burning when urinating, or frequent urination) *BOWEL PROBLEMS (unusual diarrhea, constipation, pain near the anus) TENDERNESS IN MOUTH AND THROAT WITH OR WITHOUT PRESENCE OF ULCERS (sore throat, sores in mouth, or a toothache) UNUSUAL RASH, SWELLING OR PAIN  UNUSUAL VAGINAL DISCHARGE OR ITCHING   Items with * indicate a potential  emergency and should be followed up as soon as possible or go to the Emergency Department if any problems should occur.  Please show the CHEMOTHERAPY ALERT CARD or IMMUNOTHERAPY ALERT CARD at check-in to the Emergency Department and triage nurse.  Should you have questions after your visit or need to cancel or reschedule your appointment, please contact [facility] [phone]  and follow the prompts.  Office hours are 8:00 a.m. to 4:30 p.m. Monday - Friday. Please note that voicemails left after 4:00 p.m. may not be returned until the following business day.  We are closed weekends and major holidays. You have access to a nurse at all times for urgent questions. Please call the main number to the clinic [phone] and follow the prompts.  For any non-urgent questions, you may also contact your provider using MyChart. We now offer e-Visits for anyone 66 and older to request care online for non-urgent symptoms. For details visit [email or web address].   Also download the MyChart app! Go to the app store, search "MyChart", open the app, select [facility], and log in with your MyChart username and password.

## 2023-03-07 NOTE — Progress Notes (Signed)
Patient presents today for D1 Vidaza infusion. Patient is in satisfactory condition with no new complaints voiced.  Vital signs are stable.  Labs reviewed by Dr. Ellin Saba during the office visit and all labs are within treatment parameters.  We will proceed with treatment per MD orders.   Treatment given today per MD orders. Tolerated infusion without adverse affects. Vital signs stable. No complaints at this time. Discharged from clinic ambulatory in stable condition. Alert and oriented x 3. F/U with [facility] as scheduled.

## 2023-03-07 NOTE — Patient Instructions (Signed)

## 2023-03-07 NOTE — Progress Notes (Signed)
Patients port flushed without difficulty.  Good blood return noted with no bruising or swelling noted at site.  Stable during access and blood draw.  Patient to remain accessed for treatment. 

## 2023-03-08 ENCOUNTER — Inpatient Hospital Stay: Payer: Medicare HMO

## 2023-03-08 ENCOUNTER — Other Ambulatory Visit: Payer: Self-pay

## 2023-03-08 VITALS — BP 98/58 | HR 87 | Temp 98.0°F | Resp 20 | Ht 60.0 in

## 2023-03-08 DIAGNOSIS — D539 Nutritional anemia, unspecified: Secondary | ICD-10-CM | POA: Diagnosis not present

## 2023-03-08 DIAGNOSIS — I509 Heart failure, unspecified: Secondary | ICD-10-CM | POA: Diagnosis not present

## 2023-03-08 DIAGNOSIS — K5900 Constipation, unspecified: Secondary | ICD-10-CM | POA: Diagnosis not present

## 2023-03-08 DIAGNOSIS — R197 Diarrhea, unspecified: Secondary | ICD-10-CM | POA: Diagnosis not present

## 2023-03-08 DIAGNOSIS — Z5111 Encounter for antineoplastic chemotherapy: Secondary | ICD-10-CM | POA: Diagnosis not present

## 2023-03-08 DIAGNOSIS — C9310 Chronic myelomonocytic leukemia not having achieved remission: Secondary | ICD-10-CM

## 2023-03-08 DIAGNOSIS — D696 Thrombocytopenia, unspecified: Secondary | ICD-10-CM | POA: Diagnosis not present

## 2023-03-08 DIAGNOSIS — R634 Abnormal weight loss: Secondary | ICD-10-CM | POA: Diagnosis not present

## 2023-03-08 DIAGNOSIS — Z95828 Presence of other vascular implants and grafts: Secondary | ICD-10-CM

## 2023-03-08 DIAGNOSIS — E538 Deficiency of other specified B group vitamins: Secondary | ICD-10-CM | POA: Diagnosis not present

## 2023-03-08 MED ORDER — SODIUM CHLORIDE 0.9 % IV SOLN
75.0000 mg/m2 | Freq: Once | INTRAVENOUS | Status: AC
  Administered 2023-03-08: 126 mg via INTRAVENOUS
  Filled 2023-03-08: qty 12.6

## 2023-03-08 MED ORDER — SODIUM CHLORIDE 0.9% FLUSH
10.0000 mL | Freq: Once | INTRAVENOUS | Status: AC
  Administered 2023-03-08: 10 mL via INTRAVENOUS

## 2023-03-08 MED ORDER — HEPARIN SOD (PORK) LOCK FLUSH 100 UNIT/ML IV SOLN
500.0000 [IU] | Freq: Once | INTRAVENOUS | Status: AC
  Administered 2023-03-08: 500 [IU] via INTRAVENOUS

## 2023-03-08 MED ORDER — SODIUM CHLORIDE 0.9 % IV SOLN: Freq: Once | INTRAVENOUS | Status: AC

## 2023-03-08 NOTE — Progress Notes (Signed)
Patient presents today for Vidaza infusion per providers order.  Vital signs within parameters for treatment.  Patient has no new complaints at this time.  Treatment given today per MD orders.  Stable during infusion without adverse affects.  Vital signs stable.  No complaints at this time.  Discharge from clinic ambulatory in stable condition.  Alert and oriented X 3.  Follow up with [facility] as scheduled.

## 2023-03-08 NOTE — Patient Instructions (Signed)
[facility] [place]  Discharge Instructions: Thank you for choosing [facility] to provide your oncology and hematology care.  If you have a lab appointment with the Cancer Center - please note that after April 8th, 2024, all labs will be drawn in the cancer center.  You do not have to check in or register with the main entrance as you have in the past but will complete your check-in in the cancer center.  Wear comfortable clothing and clothing appropriate for easy access to any Portacath or PICC line.   We strive to give you quality time with your provider. You may need to reschedule your appointment if you arrive late (15 or more minutes).  Arriving late affects you and other patients whose appointments are after yours.  Also, if you miss three or more appointments without notifying the office, you may be dismissed from the clinic at the provider's discretion.      For prescription refill requests, have your pharmacy contact our office and allow 72 hours for refills to be completed.    Today you received the following chemotherapy and/or immunotherapy agents Vidaza   To help prevent nausea and vomiting after your treatment, we encourage you to take your nausea medication as directed.  BELOW ARE SYMPTOMS THAT SHOULD BE REPORTED IMMEDIATELY: *FEVER GREATER THAN 100.4 F (38 C) OR HIGHER *CHILLS OR SWEATING *NAUSEA AND VOMITING THAT IS NOT CONTROLLED WITH YOUR NAUSEA MEDICATION *UNUSUAL SHORTNESS OF BREATH *UNUSUAL BRUISING OR BLEEDING *URINARY PROBLEMS (pain or burning when urinating, or frequent urination) *BOWEL PROBLEMS (unusual diarrhea, constipation, pain near the anus) TENDERNESS IN MOUTH AND THROAT WITH OR WITHOUT PRESENCE OF ULCERS (sore throat, sores in mouth, or a toothache) UNUSUAL RASH, SWELLING OR PAIN  UNUSUAL VAGINAL DISCHARGE OR ITCHING   Items with * indicate a potential emergency and should be followed up as soon as possible or go to the  Emergency Department if any problems should occur.  Please show the CHEMOTHERAPY ALERT CARD or IMMUNOTHERAPY ALERT CARD at check-in to the Emergency Department and triage nurse.  Should you have questions after your visit or need to cancel or reschedule your appointment, please contact [facility] [place] [phone]  and follow the prompts.  Office hours are 8:00 a.m. to 4:30 p.m. Monday - Friday. Please note that voicemails left after 4:00 p.m. may not be returned until the following business day.  We are closed weekends and major holidays. You have access to a nurse at all times for urgent questions. Please call the main number to the clinic [phone] and follow the prompts.  For any non-urgent questions, you may also contact your provider using MyChart. We now offer e-Visits for anyone 18 and older to request care online for non-urgent symptoms. For details visit [email or web address].   Also download the MyChart app! Go to the app store, search "MyChart", open the app, select [facility], and log in with your MyChart username and password.   

## 2023-03-09 ENCOUNTER — Ambulatory Visit: Payer: Medicare HMO

## 2023-03-09 ENCOUNTER — Inpatient Hospital Stay: Payer: Medicare HMO

## 2023-03-09 VITALS — BP 96/57 | HR 76 | Temp 96.7°F | Resp 18

## 2023-03-09 DIAGNOSIS — R197 Diarrhea, unspecified: Secondary | ICD-10-CM | POA: Diagnosis not present

## 2023-03-09 DIAGNOSIS — C9310 Chronic myelomonocytic leukemia not having achieved remission: Secondary | ICD-10-CM | POA: Diagnosis not present

## 2023-03-09 DIAGNOSIS — Z5111 Encounter for antineoplastic chemotherapy: Secondary | ICD-10-CM | POA: Diagnosis not present

## 2023-03-09 DIAGNOSIS — D696 Thrombocytopenia, unspecified: Secondary | ICD-10-CM | POA: Diagnosis not present

## 2023-03-09 DIAGNOSIS — Z95828 Presence of other vascular implants and grafts: Secondary | ICD-10-CM

## 2023-03-09 DIAGNOSIS — I509 Heart failure, unspecified: Secondary | ICD-10-CM | POA: Diagnosis not present

## 2023-03-09 DIAGNOSIS — R634 Abnormal weight loss: Secondary | ICD-10-CM | POA: Diagnosis not present

## 2023-03-09 DIAGNOSIS — K5900 Constipation, unspecified: Secondary | ICD-10-CM | POA: Diagnosis not present

## 2023-03-09 DIAGNOSIS — E538 Deficiency of other specified B group vitamins: Secondary | ICD-10-CM | POA: Diagnosis not present

## 2023-03-09 DIAGNOSIS — D539 Nutritional anemia, unspecified: Secondary | ICD-10-CM | POA: Diagnosis not present

## 2023-03-09 MED ORDER — SODIUM CHLORIDE 0.9 % IV SOLN
75.0000 mg/m2 | Freq: Once | INTRAVENOUS | Status: AC
  Administered 2023-03-09: 126 mg via INTRAVENOUS
  Filled 2023-03-09: qty 12.6

## 2023-03-09 MED ORDER — SODIUM CHLORIDE 0.9% FLUSH
10.0000 mL | Freq: Once | INTRAVENOUS | Status: AC
  Administered 2023-03-09: 10 mL via INTRAVENOUS

## 2023-03-09 MED ORDER — PALONOSETRON HCL INJECTION 0.25 MG/5ML
0.2500 mg | Freq: Once | INTRAVENOUS | Status: AC
  Administered 2023-03-09: 0.25 mg via INTRAVENOUS
  Filled 2023-03-09: qty 5

## 2023-03-09 MED ORDER — SODIUM CHLORIDE 0.9 % IV SOLN: Freq: Once | INTRAVENOUS | Status: AC

## 2023-03-09 MED ORDER — HEPARIN SOD (PORK) LOCK FLUSH 100 UNIT/ML IV SOLN
500.0000 [IU] | Freq: Once | INTRAVENOUS | Status: AC
  Administered 2023-03-09: 500 [IU] via INTRAVENOUS

## 2023-03-09 NOTE — Progress Notes (Signed)
Patient presents today for Vidaza infusion per providers order.  Vital signs and labs within parameters for treatment.  Patient has no new complaints at this time.  Treatment given today per MD orders.  Stable during infusion without adverse affects.  Vital signs stable.  No complaints at this time.  Discharge from clinic ambulatory in stable condition.  Alert and oriented X 3.  Follow up with [facility] as scheduled.

## 2023-03-09 NOTE — Patient Instructions (Signed)
[facility] [place]  Discharge Instructions: Thank you for choosing [facility] to provide your oncology and hematology care.  If you have a lab appointment with the Cancer Center - please note that after April 8th, 2024, all labs will be drawn in the cancer center.  You do not have to check in or register with the main entrance as you have in the past but will complete your check-in in the cancer center.  Wear comfortable clothing and clothing appropriate for easy access to any Portacath or PICC line.   We strive to give you quality time with your provider. You may need to reschedule your appointment if you arrive late (15 or more minutes).  Arriving late affects you and other patients whose appointments are after yours.  Also, if you miss three or more appointments without notifying the office, you may be dismissed from the clinic at the provider's discretion.      For prescription refill requests, have your pharmacy contact our office and allow 72 hours for refills to be completed.    Today you received the following chemotherapy and/or immunotherapy agents Vidaza   To help prevent nausea and vomiting after your treatment, we encourage you to take your nausea medication as directed.  BELOW ARE SYMPTOMS THAT SHOULD BE REPORTED IMMEDIATELY: *FEVER GREATER THAN 100.4 F (38 C) OR HIGHER *CHILLS OR SWEATING *NAUSEA AND VOMITING THAT IS NOT CONTROLLED WITH YOUR NAUSEA MEDICATION *UNUSUAL SHORTNESS OF BREATH *UNUSUAL BRUISING OR BLEEDING *URINARY PROBLEMS (pain or burning when urinating, or frequent urination) *BOWEL PROBLEMS (unusual diarrhea, constipation, pain near the anus) TENDERNESS IN MOUTH AND THROAT WITH OR WITHOUT PRESENCE OF ULCERS (sore throat, sores in mouth, or a toothache) UNUSUAL RASH, SWELLING OR PAIN  UNUSUAL VAGINAL DISCHARGE OR ITCHING   Items with * indicate a potential emergency and should be followed up as soon as possible or go to the  Emergency Department if any problems should occur.  Please show the CHEMOTHERAPY ALERT CARD or IMMUNOTHERAPY ALERT CARD at check-in to the Emergency Department and triage nurse.  Should you have questions after your visit or need to cancel or reschedule your appointment, please contact [facility] [place] [phone]  and follow the prompts.  Office hours are 8:00 a.m. to 4:30 p.m. Monday - Friday. Please note that voicemails left after 4:00 p.m. may not be returned until the following business day.  We are closed weekends and major holidays. You have access to a nurse at all times for urgent questions. Please call the main number to the clinic [phone] and follow the prompts.  For any non-urgent questions, you may also contact your provider using MyChart. We now offer e-Visits for anyone 18 and older to request care online for non-urgent symptoms. For details visit [email or web address].   Also download the MyChart app! Go to the app store, search "MyChart", open the app, select [facility], and log in with your MyChart username and password.   

## 2023-03-10 ENCOUNTER — Inpatient Hospital Stay: Payer: Medicare HMO

## 2023-03-10 VITALS — BP 106/71 | HR 78 | Temp 98.3°F | Resp 18

## 2023-03-10 DIAGNOSIS — R634 Abnormal weight loss: Secondary | ICD-10-CM | POA: Diagnosis not present

## 2023-03-10 DIAGNOSIS — Z5111 Encounter for antineoplastic chemotherapy: Secondary | ICD-10-CM | POA: Diagnosis not present

## 2023-03-10 DIAGNOSIS — C9310 Chronic myelomonocytic leukemia not having achieved remission: Secondary | ICD-10-CM

## 2023-03-10 DIAGNOSIS — I509 Heart failure, unspecified: Secondary | ICD-10-CM | POA: Diagnosis not present

## 2023-03-10 DIAGNOSIS — K5900 Constipation, unspecified: Secondary | ICD-10-CM | POA: Diagnosis not present

## 2023-03-10 DIAGNOSIS — Z95828 Presence of other vascular implants and grafts: Secondary | ICD-10-CM

## 2023-03-10 DIAGNOSIS — D539 Nutritional anemia, unspecified: Secondary | ICD-10-CM | POA: Diagnosis not present

## 2023-03-10 DIAGNOSIS — R197 Diarrhea, unspecified: Secondary | ICD-10-CM | POA: Diagnosis not present

## 2023-03-10 DIAGNOSIS — E538 Deficiency of other specified B group vitamins: Secondary | ICD-10-CM | POA: Diagnosis not present

## 2023-03-10 DIAGNOSIS — D696 Thrombocytopenia, unspecified: Secondary | ICD-10-CM | POA: Diagnosis not present

## 2023-03-10 MED ORDER — SODIUM CHLORIDE 0.9 % IV SOLN: Freq: Once | INTRAVENOUS | Status: AC

## 2023-03-10 MED ORDER — HEPARIN SOD (PORK) LOCK FLUSH 100 UNIT/ML IV SOLN
500.0000 [IU] | Freq: Once | INTRAVENOUS | Status: AC
  Administered 2023-03-10: 500 [IU] via INTRAVENOUS

## 2023-03-10 MED ORDER — SODIUM CHLORIDE 0.9% FLUSH
10.0000 mL | Freq: Once | INTRAVENOUS | Status: AC
  Administered 2023-03-10: 10 mL via INTRAVENOUS

## 2023-03-10 MED ORDER — SODIUM CHLORIDE 0.9 % IV SOLN
75.0000 mg/m2 | Freq: Once | INTRAVENOUS | Status: AC
  Administered 2023-03-10: 126 mg via INTRAVENOUS
  Filled 2023-03-10: qty 12.6

## 2023-03-10 MED FILL — Azacitidine For Inj 100 MG: INTRAMUSCULAR | Qty: 12.6 | Status: AC

## 2023-03-10 NOTE — Progress Notes (Signed)
Patient presents today for Vidaza chemotherapy infusion.  Patient is in satisfactory condition with no new complaints voiced.  Vital signs are stable.  Labs reviewed and all labs are within treatment parameters.  We will proceed with treatment per MD orders.    Patient tolerated treatment well with no complaints voiced.  Patient left ambulatory in stable condition.  Vital signs stable at discharge.  Follow up as scheduled.

## 2023-03-10 NOTE — Patient Instructions (Signed)
[facility] [place]  Discharge Instructions: Thank you for choosing [facility] to provide your oncology and hematology care.  If you have a lab appointment with the Cancer Center - please note that after April 8th, 2024, all labs will be drawn in the cancer center.  You do not have to check in or register with the main entrance as you have in the past but will complete your check-in in the cancer center.  Wear comfortable clothing and clothing appropriate for easy access to any Portacath or PICC line.   We strive to give you quality time with your provider. You may need to reschedule your appointment if you arrive late (15 or more minutes).  Arriving late affects you and other patients whose appointments are after yours.  Also, if you miss three or more appointments without notifying the office, you may be dismissed from the clinic at the provider's discretion.      For prescription refill requests, have your pharmacy contact our office and allow 72 hours for refills to be completed.    Today you received the following chemotherapy and/or immunotherapy agents Vidaza.  Azacitidine Injection What is this medication? AZACITIDINE (ay za SITE i deen) treats blood and bone marrow cancers. It works by slowing down the growth of cancer cells. This medicine may be used for other purposes; ask your health care provider or pharmacist if you have questions. COMMON BRAND NAME(S): Vidaza What should I tell my care team before I take this medication? They need to know if you have any of these conditions: Kidney disease Liver disease Low blood cell levels, such as low white cells, platelets, or red blood cells Low levels of albumin in the blood Low levels of bicarbonate in the blood An unusual or allergic reaction to azacitidine, mannitol, other medications, foods, dyes, or preservatives If you or your partner are pregnant or trying to get pregnant Breast-feeding How should I  use this medication? This medication is injected into a vein or under the skin. It is given by your care team in a hospital or clinic setting. Talk to your care team about the use of this medication in children. While it may be prescribed for children as young as 1 month for selected conditions, precautions do apply. Overdosage: If you think you have taken too much of this medicine contact a poison control center or emergency room at once. NOTE: This medicine is only for you. Do not share this medicine with others. What if I miss a dose? Keep appointments for follow-up doses. It is important not to miss your dose. Call your care team if you are unable to keep an appointment. What may interact with this medication? Interactions are not expected. This list may not describe all possible interactions. Give your health care provider a list of all the medicines, herbs, non-prescription drugs, or dietary supplements you use. Also tell them if you smoke, drink alcohol, or use illegal drugs. Some items may interact with your medicine. What should I watch for while using this medication? Your condition will be monitored carefully while you are receiving this medication. You may need blood work while taking this medication. This medication may make you feel generally unwell. This is not uncommon as chemotherapy can affect healthy cells as well as cancer cells. Report any side effects. Continue your course of treatment even though you feel ill unless your care team tells you to stop. Other product types may be available that contain the   medication azacitidine. The injection and oral products should not be used in place of one another. Talk to your care team if you have questions. This medication can cause serious side effects. To reduce the risk, your care team may give you other medications to take before receiving this one. Be sure to follow the directions from your care team. This medication may increase your  risk of getting an infection. Call your care team for advice if you get a fever, chills, sore throat, or other symptoms of a cold or flu. Do not treat yourself. Try to avoid being around people who are sick. Avoid taking medications that contain aspirin, acetaminophen, ibuprofen, naproxen, or ketoprofen unless instructed by your care team. These medications may hide a fever. This medication may increase your risk to bruise or bleed. Call your care team if you notice any unusual bleeding. Be careful brushing or flossing your teeth or using a toothpick because you may get an infection or bleed more easily. If you have any dental work done, tell your dentist you are receiving this medication. Talk to your care team if you or your partner may be pregnant. Serious birth defects can occur if you take this medication during pregnancy and for 6 months after the last dose. You will need a negative pregnancy test before starting this medication. Contraception is recommended while taking his medication and for 6 months after the last dose. Your care team can help you find the option that works for you. If your partner can get pregnant, use a condom during sex while taking this medication and for 3 months after the last dose. Do not breastfeed while taking this medication and for 1 week after the last dose. This medication may cause infertility. Talk to your care team if you are concerned about your fertility. What side effects may I notice from receiving this medication? Side effects that you should report to your care team as soon as possible: Allergic reactions--skin rash, itching, hives, swelling of the face, lips, tongue, or throat Infection--fever, chills, cough, sore throat, wounds that don't heal, pain or trouble when passing urine, general feeling of discomfort or being unwell Kidney injury--decrease in the amount of urine, swelling of the ankles, hands, or feet Liver injury--right upper belly pain, loss  of appetite, nausea, light-colored stool, dark yellow or brown urine, yellowing skin or eyes, unusual weakness or fatigue Low red blood cell level--unusual weakness or fatigue, dizziness, headache, trouble breathing Tumor lysis syndrome (TLS)--nausea, vomiting, diarrhea, decrease in the amount of urine, dark urine, unusual weakness or fatigue, confusion, muscle pain or cramps, fast or irregular heartbeat, joint pain Unusual bruising or bleeding Side effects that usually do not require medical attention (report to your care team if they continue or are bothersome): Constipation Diarrhea Nausea Pain, redness, or irritation at injection site Vomiting This list may not describe all possible side effects. Call your doctor for medical advice about side effects. You may report side effects to FDA at 1-800-FDA-1088. Where should I keep my medication? This medication is given in a hospital or clinic. It will not be stored at home. NOTE: This sheet is a summary. It may not cover all possible information. If you have questions about this medicine, talk to your doctor, pharmacist, or health care provider.  2024 Elsevier/Gold Standard (2021-11-19 00:00:00)        To help prevent nausea and vomiting after your treatment, we encourage you to take your nausea medication as directed.  BELOW ARE SYMPTOMS   THAT SHOULD BE REPORTED IMMEDIATELY: *FEVER GREATER THAN 100.4 F (38 C) OR HIGHER *CHILLS OR SWEATING *NAUSEA AND VOMITING THAT IS NOT CONTROLLED WITH YOUR NAUSEA MEDICATION *UNUSUAL SHORTNESS OF BREATH *UNUSUAL BRUISING OR BLEEDING *URINARY PROBLEMS (pain or burning when urinating, or frequent urination) *BOWEL PROBLEMS (unusual diarrhea, constipation, pain near the anus) TENDERNESS IN MOUTH AND THROAT WITH OR WITHOUT PRESENCE OF ULCERS (sore throat, sores in mouth, or a toothache) UNUSUAL RASH, SWELLING OR PAIN  UNUSUAL VAGINAL DISCHARGE OR ITCHING   Items with * indicate a potential emergency and  should be followed up as soon as possible or go to the Emergency Department if any problems should occur.  Please show the CHEMOTHERAPY ALERT CARD or IMMUNOTHERAPY ALERT CARD at check-in to the Emergency Department and triage nurse.  Should you have questions after your visit or need to cancel or reschedule your appointment, please contact [facility] [place] [phone]  and follow the prompts.  Office hours are 8:00 a.m. to 4:30 p.m. Monday - Friday. Please note that voicemails left after 4:00 p.m. may not be returned until the following business day.  We are closed weekends and major holidays. You have access to a nurse at all times for urgent questions. Please call the main number to the clinic [phone] and follow the prompts.  For any non-urgent questions, you may also contact your provider using MyChart. We now offer e-Visits for anyone 18 and older to request care online for non-urgent symptoms. For details visit [email or web address].   Also download the MyChart app! Go to the app store, search "MyChart", open the app, select [facility], and log in with your MyChart username and password.   

## 2023-03-11 ENCOUNTER — Inpatient Hospital Stay: Payer: Medicare HMO

## 2023-03-11 VITALS — BP 107/75 | HR 72 | Temp 97.3°F | Resp 16

## 2023-03-11 DIAGNOSIS — E538 Deficiency of other specified B group vitamins: Secondary | ICD-10-CM | POA: Diagnosis not present

## 2023-03-11 DIAGNOSIS — D696 Thrombocytopenia, unspecified: Secondary | ICD-10-CM | POA: Diagnosis not present

## 2023-03-11 DIAGNOSIS — C9310 Chronic myelomonocytic leukemia not having achieved remission: Secondary | ICD-10-CM

## 2023-03-11 DIAGNOSIS — I509 Heart failure, unspecified: Secondary | ICD-10-CM | POA: Diagnosis not present

## 2023-03-11 DIAGNOSIS — Z95828 Presence of other vascular implants and grafts: Secondary | ICD-10-CM

## 2023-03-11 DIAGNOSIS — D539 Nutritional anemia, unspecified: Secondary | ICD-10-CM | POA: Diagnosis not present

## 2023-03-11 DIAGNOSIS — K5900 Constipation, unspecified: Secondary | ICD-10-CM | POA: Diagnosis not present

## 2023-03-11 DIAGNOSIS — R634 Abnormal weight loss: Secondary | ICD-10-CM | POA: Diagnosis not present

## 2023-03-11 DIAGNOSIS — Z5111 Encounter for antineoplastic chemotherapy: Secondary | ICD-10-CM | POA: Diagnosis not present

## 2023-03-11 DIAGNOSIS — R197 Diarrhea, unspecified: Secondary | ICD-10-CM | POA: Diagnosis not present

## 2023-03-11 MED ORDER — SODIUM CHLORIDE 0.9 % IV SOLN
75.0000 mg/m2 | Freq: Once | INTRAVENOUS | Status: AC
  Administered 2023-03-11: 126 mg via INTRAVENOUS
  Filled 2023-03-11: qty 12.6

## 2023-03-11 MED ORDER — SODIUM CHLORIDE 0.9 % IV SOLN: Freq: Once | INTRAVENOUS | Status: AC

## 2023-03-11 MED ORDER — SODIUM CHLORIDE 0.9% FLUSH
10.0000 mL | Freq: Once | INTRAVENOUS | Status: AC
  Administered 2023-03-11: 10 mL via INTRAVENOUS

## 2023-03-11 MED ORDER — PALONOSETRON HCL INJECTION 0.25 MG/5ML
0.2500 mg | Freq: Once | INTRAVENOUS | Status: AC
  Administered 2023-03-11: 0.25 mg via INTRAVENOUS
  Filled 2023-03-11: qty 5

## 2023-03-11 MED ORDER — HEPARIN SOD (PORK) LOCK FLUSH 100 UNIT/ML IV SOLN
500.0000 [IU] | Freq: Once | INTRAVENOUS | Status: AC
  Administered 2023-03-11: 500 [IU] via INTRAVENOUS

## 2023-03-11 NOTE — Progress Notes (Signed)
Patient presents today for Vidaza infusion per providers order.  Vital signs within parameters for treatment.  Patient has no new complaints at this time.  Treatment given today per MD orders.  Stable during infusion without adverse affects.  Vital signs stable.  No complaints at this time.  Discharge from clinic ambulatory in stable condition.  Alert and oriented X 3.  Follow up with [facility] as scheduled.

## 2023-03-11 NOTE — Patient Instructions (Signed)
[facility] [place]  Discharge Instructions: Thank you for choosing [facility] to provide your oncology and hematology care.  If you have a lab appointment with the Cancer Center - please note that after April 8th, 2024, all labs will be drawn in the cancer center.  You do not have to check in or register with the main entrance as you have in the past but will complete your check-in in the cancer center.  Wear comfortable clothing and clothing appropriate for easy access to any Portacath or PICC line.   We strive to give you quality time with your provider. You may need to reschedule your appointment if you arrive late (15 or more minutes).  Arriving late affects you and other patients whose appointments are after yours.  Also, if you miss three or more appointments without notifying the office, you may be dismissed from the clinic at the provider's discretion.      For prescription refill requests, have your pharmacy contact our office and allow 72 hours for refills to be completed.    Today you received the following chemotherapy and/or immunotherapy agents Vidaza   To help prevent nausea and vomiting after your treatment, we encourage you to take your nausea medication as directed.  BELOW ARE SYMPTOMS THAT SHOULD BE REPORTED IMMEDIATELY: *FEVER GREATER THAN 100.4 F (38 C) OR HIGHER *CHILLS OR SWEATING *NAUSEA AND VOMITING THAT IS NOT CONTROLLED WITH YOUR NAUSEA MEDICATION *UNUSUAL SHORTNESS OF BREATH *UNUSUAL BRUISING OR BLEEDING *URINARY PROBLEMS (pain or burning when urinating, or frequent urination) *BOWEL PROBLEMS (unusual diarrhea, constipation, pain near the anus) TENDERNESS IN MOUTH AND THROAT WITH OR WITHOUT PRESENCE OF ULCERS (sore throat, sores in mouth, or a toothache) UNUSUAL RASH, SWELLING OR PAIN  UNUSUAL VAGINAL DISCHARGE OR ITCHING   Items with * indicate a potential emergency and should be followed up as soon as possible or go to the  Emergency Department if any problems should occur.  Please show the CHEMOTHERAPY ALERT CARD or IMMUNOTHERAPY ALERT CARD at check-in to the Emergency Department and triage nurse.  Should you have questions after your visit or need to cancel or reschedule your appointment, please contact [facility] [place] [phone]  and follow the prompts.  Office hours are 8:00 a.m. to 4:30 p.m. Monday - Friday. Please note that voicemails left after 4:00 p.m. may not be returned until the following business day.  We are closed weekends and major holidays. You have access to a nurse at all times for urgent questions. Please call the main number to the clinic [phone] and follow the prompts.  For any non-urgent questions, you may also contact your provider using MyChart. We now offer e-Visits for anyone 18 and older to request care online for non-urgent symptoms. For details visit [email or web address].   Also download the MyChart app! Go to the app store, search "MyChart", open the app, select [facility], and log in with your MyChart username and password.   

## 2023-03-14 ENCOUNTER — Ambulatory Visit (INDEPENDENT_AMBULATORY_CARE_PROVIDER_SITE_OTHER): Payer: Medicare HMO

## 2023-03-14 DIAGNOSIS — J4550 Severe persistent asthma, uncomplicated: Secondary | ICD-10-CM | POA: Diagnosis not present

## 2023-03-14 DIAGNOSIS — J4540 Moderate persistent asthma, uncomplicated: Secondary | ICD-10-CM | POA: Diagnosis not present

## 2023-03-14 DIAGNOSIS — C9210 Chronic myeloid leukemia, BCR/ABL-positive, not having achieved remission: Secondary | ICD-10-CM | POA: Diagnosis not present

## 2023-03-14 DIAGNOSIS — F325 Major depressive disorder, single episode, in full remission: Secondary | ICD-10-CM | POA: Diagnosis not present

## 2023-03-19 ENCOUNTER — Ambulatory Visit
Admission: EM | Admit: 2023-03-19 | Discharge: 2023-03-19 | Disposition: A | Discharge status: Home / Self Care | Payer: Medicare HMO | Attending: Nurse Practitioner | Admitting: Nurse Practitioner

## 2023-03-19 DIAGNOSIS — M5441 Lumbago with sciatica, right side: Secondary | ICD-10-CM | POA: Diagnosis not present

## 2023-03-19 DIAGNOSIS — M5442 Lumbago with sciatica, left side: Secondary | ICD-10-CM | POA: Diagnosis not present

## 2023-03-19 DIAGNOSIS — G8929 Other chronic pain: Secondary | ICD-10-CM

## 2023-03-19 DIAGNOSIS — Z8739 Personal history of other diseases of the musculoskeletal system and connective tissue: Secondary | ICD-10-CM

## 2023-03-19 LAB — POCT URINALYSIS DIP (MANUAL ENTRY)
Bilirubin, UA: NEGATIVE
Blood, UA: NEGATIVE
Glucose, UA: NEGATIVE mg/dL
Ketones, POC UA: NEGATIVE mg/dL
Leukocytes, UA: NEGATIVE
Nitrite, UA: NEGATIVE
Protein Ur, POC: NEGATIVE mg/dL
Spec Grav, UA: 1.015 (ref 1.010–1.025)
Urobilinogen, UA: 1 E.U./dL
pH, UA: 6.5 (ref 5.0–8.0)

## 2023-03-19 MED ORDER — PREDNISONE 20 MG PO TABS: 20.0000 mg | ORAL_TABLET | Freq: Every day | ORAL | 0 refills | Status: AC

## 2023-03-19 MED ORDER — TIZANIDINE HCL 2 MG PO TABS: 2.0000 mg | ORAL_TABLET | Freq: Two times a day (BID) | ORAL | 0 refills | Status: AC | PRN

## 2023-03-19 MED ORDER — KETOROLAC TROMETHAMINE 30 MG/ML IJ SOLN
15.0000 mg | Freq: Once | INTRAMUSCULAR | Status: AC
  Administered 2023-03-19: 15 mg via INTRAMUSCULAR

## 2023-03-19 NOTE — ED Triage Notes (Signed)
Pt states lower back pain pelvic pain for the past 3 days.

## 2023-03-19 NOTE — ED Provider Notes (Signed)
[facility]    CSN: [id number] Arrival date & time: 03/19/23  0831      History   Chief Complaint Chief Complaint  Patient presents with   Back Pain    HPI [name] is a 72 y.o. female.   The history is provided by the patient.   Patient presents for complaints of bilateral low back pain that is been present for the past 3 days.  Patient states "it just hurts".  She states she does have a history of chronic low back pain.  She denies fever, chills, injury or trauma, numbness or tingling in her lower extremities, or loss of bowel or bladder function.  Patient states that the pain sometimes radiates into her lower extremities.  Patient reports that she does have a history of leukemia.  She reports she has been taking Tylenol for her symptoms.  Past Medical History:  Diagnosis Date   Anxiety    Asthma    had 1 episode 1 year ago-no more problem   CHF (congestive heart failure) (HCC)    swelling of feet & ankles   CMML (chronic myelomonocytic leukemia) (HCC) 06/03/2022   Cough    Depression    OCD   GERD (gastroesophageal reflux disease)    severe   OCD (obsessive compulsive disorder)    Port-A-Cath in place 07/26/2022   Shortness of breath    with exertion   Yeast infection 04/09/2014    Patient Active Problem List   Diagnosis Date Noted   Port-A-Cath in place 07/26/2022   CMML (chronic myelomonocytic leukemia) (HCC) 06/03/2022   Thrombocytopenia (HCC) 04/26/2022   Macrocytic anemia 04/26/2022   Episodic recurrent vertigo 04/20/2022   Asthma 01/12/2021   Hoarseness 03/14/2018   Perennial allergic rhinitis with a possible nonallergic component 10/04/2017   Moderate persistent asthma 10/04/2017   Oropharyngeal dysphagia 05/06/2017   Gastroesophageal reflux disease without esophagitis    Constipation 11/26/2015   Yeast infection 04/09/2014   Obesity (BMI 30-39.9) 01/31/2014   Cough, persistent    CHF (congestive heart failure) (HCC)    Depression     Anxiety    Encounter for screening colonoscopy 05/28/2012   GERD (gastroesophageal reflux disease) 05/28/2012   Low back pain 06/17/2011    Past Surgical History:  Procedure Laterality Date   CARPAL TUNNEL RELEASE  03/22/2012   Procedure: CARPAL TUNNEL RELEASE;  Surgeon: Nicki Reaper, MD;  Location: [facility];  Service: Orthopedics;  Laterality: Right;  right carpal tunnel release   COLONOSCOPY  06/19/2012   Dr. Rourk:normal rectum and colon    ESOPHAGOGASTRODUODENOSCOPY N/A 12/26/2015   Dr. Jena Gauss: normal    IR IMAGING GUIDED PORT INSERTION  07/30/2022   TONSILLECTOMY     TUBAL LIGATION      OB History     Gravida  1   Para  1   Term      Preterm      AB      Living  1      SAB      IAB      Ectopic      Multiple      Live Births               Home Medications    Prior to Admission medications   Medication Sig Start Date End Date Taking? Authorizing Provider  predniSONE (DELTASONE) 20 MG tablet Take 1 tablet (20 mg total) by mouth daily with breakfast for 5 days.  03/19/23 03/24/23 Yes Naba Sneed-Warren, Sadie Haber, NP  tiZANidine (ZANAFLEX) 2 MG tablet Take 1 tablet (2 mg total) by mouth 2 (two) times daily as needed for muscle spasms. 03/19/23  Yes Kayzen Kendzierski-Warren, Sadie Haber, NP  albuterol (VENTOLIN HFA) 108 (90 Base) MCG/ACT inhaler INHALE 2 PUFFS BY MOUTH EVERY 6 HOURS AS NEEDED FOR SHORTNESS OF BREATH OR WHEEZING. 04/25/20   Marcelyn Bruins, MD  albuterol (VENTOLIN HFA) 108 (90 Base) MCG/ACT inhaler Inhale 2 puffs into the lungs every 4 (four) hours as needed for wheezing or shortness of breath. 11/05/22   Marcelyn Bruins, MD  ARNUITY ELLIPTA 200 MCG/ACT AEPB Inhale 1 puff into the lungs daily. 11/17/22   Marcelyn Bruins, MD  azaCITIDine 5 mg/2 mLs in lactated ringers infusion Inject into the vein daily. Days 1-7 every 28 days 08/02/22   [provider]  azelastine (ASTELIN) 0.1 % nasal spray SPRAY 1 TO 2 SPRAYS PER  NOSTRIL TWICE DAILY. 08/10/19   Bobbitt, Heywood Iles, MD  benzonatate (TESSALON) 200 MG capsule Take 200 mg by mouth 3 (three) times daily as needed. 11/01/22   [provider]  BREZTRI AEROSPHERE 160-9-4.8 MCG/ACT AERO Inhale 2 puffs into the lungs 2 (two) times daily. 11/05/22   Marcelyn Bruins, MD  cetirizine (ZYRTEC) 5 MG tablet Take 1 tablet (5 mg total) by mouth daily. 10/31/21   Wallis Bamberg, PA-C  ciclopirox (PENLAC) 8 % solution APPLY TOPICALLY AT BEDTIME. APPLY OVER NAIL FOLD AND SURROUNDING SKIN. APPLY DAILY OVER PREVIOUS COAT. AFTER SEVEN DAYS, MAY REMOVE WITH ALCOHOL AND CONTINUE CYCLE. 02/22/23   Standiford, Jenelle Mages, DPM  EPINEPHrine 0.3 mg/0.3 mL IJ SOAJ injection Inject 0.3 mg into the muscle as needed for anaphylaxis. 08/07/20   Alfonse Spruce, MD  famotidine (PEPCID) 20 MG tablet Take 1 tablet (20 mg total) by mouth 2 (two) times daily. 11/05/22   Marcelyn Bruins, MD  FLUoxetine (PROZAC) 20 MG capsule Take 1 capsule by mouth daily.    [provider]  fluticasone (FLONASE) 50 MCG/ACT nasal spray Place 2 sprays into both nostrils 2 (two) times daily. 11/05/22   Marcelyn Bruins, MD  fluticasone (FLOVENT HFA) 110 MCG/ACT inhaler Inhale two puffs twice daily to prevent cough or wheeze. Rinse mouth after use. 11/05/22   Marcelyn Bruins, MD  folic acid (FOLVITE) 1 MG tablet TAKE ONE TABLET BY MOUTH ONCE DAILY 02/22/23   Doreatha Massed, MD  gabapentin (NEURONTIN) 300 MG capsule  08/10/18   [provider]  ipratropium (ATROVENT) 0.06 % nasal spray USE 2 SPRAYS IN EACH NOSTRIL THREE TIMES A DAY AS NEEDED. 11/05/22   Padgett, Pilar Grammes, MD  lactulose (CHRONULAC) 10 GM/15ML solution take 30ml BY MOUTH EVERY THREE HOURS UNTIL bowel movement; THEN take 30ml ONCE DAILY AS NEEDED FOR CONSTIPATION 10/26/22   Doreatha Massed, MD  lidocaine-prilocaine (EMLA) cream Apply a quarter sized amount to port a cath site and cover  with plastic wrap one hour prior to infusion appointments 07/29/22   Doreatha Massed, MD  megestrol (MEGACE) 400 MG/10ML suspension Take 10 mLs (400 mg total) by mouth 2 (two) times daily. 07/13/22   Doreatha Massed, MD  montelukast (SINGULAIR) 10 MG tablet Take 1 tablet (10 mg total) by mouth at bedtime. 11/05/22   Marcelyn Bruins, MD  NON [facility]  Antifungal (nail)-#1    [provider]  pantoprazole (PROTONIX) 40 MG tablet Take 1 tablet (40 mg total) by mouth daily. 11/05/22   Padgett,  Pilar Grammes, MD  prochlorperazine (COMPAZINE) 10 MG tablet Take 1 tablet (10 mg total) by mouth every 6 (six) hours as needed for nausea or vomiting. 07/13/22   Doreatha Massed, MD  Respiratory Therapy Supplies (FLUTTER) DEVI Use as directed 10/04/17   Cristal Ford, MD  SF 5000 PLUS 1.1 % CREA dental cream  01/29/19   [provider]  Spacer/Aero-Holding Chambers (AEROCHAMBER PLUS WITH MASK) inhaler 1 each by Other route See admin instructions. Use with inhaler as instructed. 02/20/20   Marcelyn Bruins, MD  traZODone (DESYREL) 50 MG tablet TAKE ONE TABLET BY MOUTH AT BEDTIME 01/31/23   Doreatha Massed, MD    Family History Family History  Problem Relation Age of Onset   Asthma Mother    Macular degeneration Mother    Hypertension Mother    Stomach cancer Mother 6   Alzheimer's disease Father    Other Sister        blood clots   Diabetes Maternal Grandfather    Diabetes Paternal Grandfather    Other Son        enlarged spleen   Lymphoma Cousin        x2, both dx in their 82s   Colon cancer Neg Hx     Social History Social History   Tobacco Use   Smoking status: Never    Passive exposure: Yes   Smokeless tobacco: Never  Vaping Use   Vaping status: Never Used  Substance Use Topics   Alcohol use: No   Drug use: No     Allergies   Levonorgestrel-ethinyl estrad, Sulfa antibiotics,  Sulfamethoxazole-trimethoprim, and Cephalosporins   Review of Systems Review of Systems Per HPI  Physical Exam Triage Vital Signs ED Triage Vitals  Encounter Vitals Group     BP 03/19/23 0930 107/64     Systolic BP Percentile --      Diastolic BP Percentile --      Pulse Rate 03/19/23 0932 77     Resp 03/19/23 0932 16     Temp 03/19/23 0930 98.3 F (36.8 C)     Temp Source 03/19/23 0930 Oral     SpO2 03/19/23 0932 92 %     Weight --      Height --      Head Circumference --      Peak Flow --      Pain Score 03/19/23 0931 8     Pain Loc --      Pain Education --      Exclude from Growth Chart --    No data found.  Updated Vital Signs BP 107/64 (BP Location: Right Arm)   Pulse 77   Temp 98.3 F (36.8 C) (Oral)   Resp 16   SpO2 92%   Visual Acuity Right Eye Distance:   Left Eye Distance:   Bilateral Distance:    Right Eye Near:   Left Eye Near:    Bilateral Near:     Physical Exam Vitals and nursing note reviewed.  Constitutional:      General: She is not in acute distress.    Appearance: Normal appearance.  HENT:     Head: Normocephalic.  Eyes:     Extraocular Movements: Extraocular movements intact.     Pupils: Pupils are equal, round, and reactive to light.  Cardiovascular:     Rate and Rhythm: Regular rhythm.     Pulses: Normal pulses.     Heart sounds: Normal heart sounds.  Pulmonary:  Effort: Pulmonary effort is normal. No respiratory distress.     Breath sounds: Normal breath sounds. No stridor. No wheezing, rhonchi or rales.  Abdominal:     General: Bowel sounds are normal.     Palpations: Abdomen is soft.     Tenderness: There is no abdominal tenderness.  Musculoskeletal:     Cervical back: Normal range of motion.     Lumbar back: Spasms and tenderness (Tenderness noted in the spinal and paraspinal regions between L1 and L3) present. No swelling or deformity. Decreased range of motion. Negative right straight leg raise test and negative  left straight leg raise test.  Lymphadenopathy:     Cervical: No cervical adenopathy.  Skin:    General: Skin is warm and dry.  Neurological:     General: No focal deficit present.     Mental Status: She is alert and oriented to person, place, and time.  Psychiatric:        Mood and Affect: Mood normal.        Behavior: Behavior normal.      UC Treatments / Results  Labs (all labs ordered are listed, but only abnormal results are displayed) Labs Reviewed  POCT URINALYSIS DIP (MANUAL ENTRY)    EKG   Radiology No results found.  Procedures Procedures (including critical care time)  Medications Ordered in UC Medications  ketorolac (TORADOL) 30 MG/ML injection 15 mg (has no administration in time range)    Initial Impression / Assessment and Plan / UC Course  I have reviewed the triage vital signs and the nursing notes.  Pertinent labs & imaging results that were available during my care of the patient were reviewed by me and considered in my medical decision making (see chart for details).  The patient is well-appearing, she is in no acute distress, vital signs are stable.  Urinalysis is negative.  Patient does have a history of chronic low back pain.  Toradol 15 mg IM administered.  Will provide symptomatic treatment with tizanidine 2 mg for back pain and back spasm.  Will also treat with prednisone 20 mg for the next 5 days for possible inflammation.  Supportive care recommendations were provided and discussed with the patient to include over-the-counter analgesics, Tylenol arthritis strength 650 mg tablets, use of ice or heat, and gentle stretching exercises.  Patient was was given strict ER follow-up precautions.  Patient was advised if symptoms do not improve with this treatment, it is recommended that she follow-up with her primary care physician or with orthopedics for further evaluation.  Patient is in agreement with this plan of care and verbalizes understanding.  All  questions were answered.  Patient stable for discharge.  Final Clinical Impressions(s) / UC Diagnoses   Final diagnoses:  Chronic low back pain with bilateral sciatica, unspecified back pain laterality  History of back pain     Discharge Instructions      You were given an injection of Toradol 15 mg today. Your urinalysis was negative. Take medication as prescribed. Try to remain as active as possible. Gentle range of motion and stretching exercises to help with back spasm and pain. May apply ice or heat as needed.  Ice is recommended for pain or swelling, heat for spasm or stiffness.  Apply for 20 minutes, remove for 1 hour, then repeat. Recommend Tylenol arthritis strength 650 mg tablet every 8 hours as needed. Go to the emergency department immediately if you develop weakness in your legs or feet, inability to walk,  loss of bowel or bladder function, difficulty urinating or passing a bowel movement, or other concerns. As discussed, if symptoms do not improve with this treatment, please follow-up with your primary care physician or with orthopedics for further evaluation. Follow-up as needed.     ED Prescriptions     Medication Sig Dispense Auth. Provider   predniSONE (DELTASONE) 20 MG tablet Take 1 tablet (20 mg total) by mouth daily with breakfast for 5 days. 5 tablet Klaus Casteneda-Warren, Sadie Haber, NP   tiZANidine (ZANAFLEX) 2 MG tablet Take 1 tablet (2 mg total) by mouth 2 (two) times daily as needed for muscle spasms. 15 tablet Manuelito Poage-Warren, Sadie Haber, NP      PDMP not reviewed this encounter.   Abran Cantor, NP 03/19/23 [phone]

## 2023-03-19 NOTE — Discharge Instructions (Addendum)
You were given an injection of Toradol 15 mg today. Your urinalysis was negative. Take medication as prescribed. Try to remain as active as possible. Gentle range of motion and stretching exercises to help with back spasm and pain. May apply ice or heat as needed.  Ice is recommended for pain or swelling, heat for spasm or stiffness.  Apply for 20 minutes, remove for 1 hour, then repeat. Recommend Tylenol arthritis strength 650 mg tablet every 8 hours as needed. Go to the emergency department immediately if you develop weakness in your legs or feet, inability to walk, loss of bowel or bladder function, difficulty urinating or passing a bowel movement, or other concerns. As discussed, if symptoms do not improve with this treatment, please follow-up with your primary care physician or with orthopedics for further evaluation. Follow-up as needed.

## 2023-03-22 ENCOUNTER — Other Ambulatory Visit: Payer: Self-pay | Admitting: Hematology

## 2023-03-22 DIAGNOSIS — G4700 Insomnia, unspecified: Secondary | ICD-10-CM

## 2023-03-25 DIAGNOSIS — C9310 Chronic myelomonocytic leukemia not having achieved remission: Secondary | ICD-10-CM | POA: Diagnosis not present

## 2023-03-25 DIAGNOSIS — C9200 Acute myeloblastic leukemia, not having achieved remission: Secondary | ICD-10-CM | POA: Diagnosis not present

## 2023-04-01 NOTE — Progress Notes (Signed)
Patient called wanting to know if she can get the flu, COVID and RSV immunizations and if so how she should get them.   Patient aware that per Durenda Hurt, NP that she have any 2 of them at once and then one a week later. Patient is agreeable with plan.

## 2023-04-02 ENCOUNTER — Other Ambulatory Visit: Payer: Self-pay

## 2023-04-08 ENCOUNTER — Other Ambulatory Visit: Payer: Self-pay

## 2023-04-08 DIAGNOSIS — C9310 Chronic myelomonocytic leukemia not having achieved remission: Secondary | ICD-10-CM

## 2023-04-11 ENCOUNTER — Inpatient Hospital Stay: Payer: Medicare HMO

## 2023-04-11 ENCOUNTER — Ambulatory Visit (INDEPENDENT_AMBULATORY_CARE_PROVIDER_SITE_OTHER): Payer: Medicare HMO

## 2023-04-11 DIAGNOSIS — J4550 Severe persistent asthma, uncomplicated: Secondary | ICD-10-CM

## 2023-04-12 ENCOUNTER — Inpatient Hospital Stay: Payer: Medicare HMO

## 2023-04-13 ENCOUNTER — Inpatient Hospital Stay: Payer: Medicare HMO

## 2023-04-14 ENCOUNTER — Inpatient Hospital Stay: Payer: Medicare HMO

## 2023-04-15 ENCOUNTER — Inpatient Hospital Stay: Payer: Medicare HMO

## 2023-04-18 ENCOUNTER — Encounter: Payer: Self-pay | Admitting: Hematology

## 2023-04-18 ENCOUNTER — Inpatient Hospital Stay: Payer: Medicare HMO | Admitting: Hematology

## 2023-04-18 ENCOUNTER — Inpatient Hospital Stay: Payer: Medicare HMO | Attending: Hematology

## 2023-04-18 ENCOUNTER — Inpatient Hospital Stay: Payer: Medicare HMO

## 2023-04-18 VITALS — BP 121/73 | HR 78 | Temp 98.0°F | Resp 16 | Wt 130.3 lb

## 2023-04-18 VITALS — BP 97/76 | HR 71 | Temp 97.4°F | Resp 17

## 2023-04-18 DIAGNOSIS — C9310 Chronic myelomonocytic leukemia not having achieved remission: Secondary | ICD-10-CM | POA: Diagnosis not present

## 2023-04-18 DIAGNOSIS — Z83518 Family history of other specified eye disorder: Secondary | ICD-10-CM | POA: Insufficient documentation

## 2023-04-18 DIAGNOSIS — Z825 Family history of asthma and other chronic lower respiratory diseases: Secondary | ICD-10-CM | POA: Insufficient documentation

## 2023-04-18 DIAGNOSIS — K5900 Constipation, unspecified: Secondary | ICD-10-CM | POA: Insufficient documentation

## 2023-04-18 DIAGNOSIS — Z800 Family history of malignant neoplasm of digestive organs: Secondary | ICD-10-CM | POA: Diagnosis not present

## 2023-04-18 DIAGNOSIS — Z8249 Family history of ischemic heart disease and other diseases of the circulatory system: Secondary | ICD-10-CM | POA: Insufficient documentation

## 2023-04-18 DIAGNOSIS — E538 Deficiency of other specified B group vitamins: Secondary | ICD-10-CM | POA: Diagnosis not present

## 2023-04-18 DIAGNOSIS — Z807 Family history of other malignant neoplasms of lymphoid, hematopoietic and related tissues: Secondary | ICD-10-CM | POA: Insufficient documentation

## 2023-04-18 DIAGNOSIS — Z9089 Acquired absence of other organs: Secondary | ICD-10-CM | POA: Diagnosis not present

## 2023-04-18 DIAGNOSIS — R161 Splenomegaly, not elsewhere classified: Secondary | ICD-10-CM | POA: Diagnosis not present

## 2023-04-18 DIAGNOSIS — Z833 Family history of diabetes mellitus: Secondary | ICD-10-CM | POA: Diagnosis not present

## 2023-04-18 DIAGNOSIS — R7402 Elevation of levels of lactic acid dehydrogenase (LDH): Secondary | ICD-10-CM | POA: Diagnosis not present

## 2023-04-18 DIAGNOSIS — Z9851 Tubal ligation status: Secondary | ICD-10-CM | POA: Insufficient documentation

## 2023-04-18 DIAGNOSIS — D539 Nutritional anemia, unspecified: Secondary | ICD-10-CM | POA: Diagnosis not present

## 2023-04-18 DIAGNOSIS — Z881 Allergy status to other antibiotic agents status: Secondary | ICD-10-CM | POA: Insufficient documentation

## 2023-04-18 DIAGNOSIS — K219 Gastro-esophageal reflux disease without esophagitis: Secondary | ICD-10-CM | POA: Insufficient documentation

## 2023-04-18 DIAGNOSIS — Z79899 Other long term (current) drug therapy: Secondary | ICD-10-CM | POA: Diagnosis not present

## 2023-04-18 DIAGNOSIS — Z882 Allergy status to sulfonamides status: Secondary | ICD-10-CM | POA: Diagnosis not present

## 2023-04-18 DIAGNOSIS — Z95828 Presence of other vascular implants and grafts: Secondary | ICD-10-CM

## 2023-04-18 DIAGNOSIS — Z5111 Encounter for antineoplastic chemotherapy: Secondary | ICD-10-CM | POA: Insufficient documentation

## 2023-04-18 DIAGNOSIS — D696 Thrombocytopenia, unspecified: Secondary | ICD-10-CM | POA: Diagnosis not present

## 2023-04-18 LAB — CBC WITH DIFFERENTIAL/PLATELET
Abs Immature Granulocytes: 0.3 10*3/uL — ABNORMAL HIGH (ref 0.00–0.07)
Band Neutrophils: 3 %
Basophils Absolute: 0 10*3/uL (ref 0.0–0.1)
Basophils Relative: 0 %
Eosinophils Absolute: 0 10*3/uL (ref 0.0–0.5)
Eosinophils Relative: 1 %
HCT: 29 % — ABNORMAL LOW (ref 36.0–46.0)
Hemoglobin: 9.1 g/dL — ABNORMAL LOW (ref 12.0–15.0)
Lymphocytes Relative: 23 %
Lymphs Abs: 0.9 10*3/uL (ref 0.7–4.0)
MCH: 30.7 pg (ref 26.0–34.0)
MCHC: 31.4 g/dL (ref 30.0–36.0)
MCV: 98 fL (ref 80.0–100.0)
Metamyelocytes Relative: 8 %
Monocytes Absolute: 0.2 10*3/uL (ref 0.1–1.0)
Monocytes Relative: 6 %
Myelocytes: 1 %
Neutro Abs: 2.3 10*3/uL (ref 1.7–7.7)
Neutrophils Relative %: 58 %
Platelets: 117 10*3/uL — ABNORMAL LOW (ref 150–400)
RBC: 2.96 MIL/uL — ABNORMAL LOW (ref 3.87–5.11)
RDW: 20.6 % — ABNORMAL HIGH (ref 11.5–15.5)
WBC: 3.8 10*3/uL — ABNORMAL LOW (ref 4.0–10.5)
nRBC: 2 /100{WBCs} — ABNORMAL HIGH
nRBC: 3.9 % — ABNORMAL HIGH (ref 0.0–0.2)

## 2023-04-18 LAB — COMPREHENSIVE METABOLIC PANEL
ALT: 12 U/L (ref 0–44)
AST: 24 U/L (ref 15–41)
Albumin: 4.2 g/dL (ref 3.5–5.0)
Alkaline Phosphatase: 76 U/L (ref 38–126)
Anion gap: 8 (ref 5–15)
BUN: 16 mg/dL (ref 8–23)
CO2: 25 mmol/L (ref 22–32)
Calcium: 9 mg/dL (ref 8.9–10.3)
Chloride: 104 mmol/L (ref 98–111)
Creatinine, Ser: 0.9 mg/dL (ref 0.44–1.00)
GFR, Estimated: >60 mL/min (ref >60)
Glucose, Bld: 105 mg/dL — ABNORMAL HIGH (ref 70–99)
Potassium: 4 mmol/L (ref 3.5–5.1)
Sodium: 137 mmol/L (ref 135–145)
Total Bilirubin: 1 mg/dL (ref 0.3–1.2)
Total Protein: 7.1 g/dL (ref 6.5–8.1)

## 2023-04-18 LAB — LACTATE DEHYDROGENASE: LDH: 650 U/L — ABNORMAL HIGH (ref 98–192)

## 2023-04-18 LAB — MAGNESIUM: Magnesium: 2.2 mg/dL (ref 1.7–2.4)

## 2023-04-18 MED ORDER — HEPARIN SOD (PORK) LOCK FLUSH 100 UNIT/ML IV SOLN
500.0000 [IU] | Freq: Once | INTRAVENOUS | Status: AC
  Administered 2023-04-18: 500 [IU] via INTRAVENOUS

## 2023-04-18 MED ORDER — SODIUM CHLORIDE 0.9 % IV SOLN
75.0000 mg/m2 | Freq: Once | INTRAVENOUS | Status: AC
  Administered 2023-04-18: 126 mg via INTRAVENOUS
  Filled 2023-04-18: qty 12.6

## 2023-04-18 MED ORDER — SODIUM CHLORIDE 0.9% FLUSH
10.0000 mL | Freq: Once | INTRAVENOUS | Status: AC
  Administered 2023-04-18: 10 mL via INTRAVENOUS

## 2023-04-18 MED ORDER — PALONOSETRON HCL INJECTION 0.25 MG/5ML
0.2500 mg | Freq: Once | INTRAVENOUS | Status: AC
  Administered 2023-04-18: 0.25 mg via INTRAVENOUS
  Filled 2023-04-18: qty 5

## 2023-04-18 MED ORDER — SODIUM CHLORIDE 0.9 % IV SOLN: Freq: Once | INTRAVENOUS | Status: AC

## 2023-04-18 NOTE — Patient Instructions (Signed)

## 2023-04-18 NOTE — Progress Notes (Signed)
[facility] [address] [place] [address]    Clinic Day:  04/18/2023  Referring physician: Carylon Perches, MD  Patient Care Team: Carylon Perches, MD as PCP - General (Internal Medicine) Jena Gauss Gerrit Friends, MD as Attending Physician (Gastroenterology) Storm Frisk, MD as Consulting Physician (Pulmonary Disease) Doreatha Massed, MD as Medical Oncologist (Hematology)   ASSESSMENT & PLAN:   Assessment: 1.  Macrocytic anemia and thrombocytopenia: - Patient seen at the request of Dr. Ouida Sills for abnormal CBC. - 04/17/2022: WBC 15.8, Hb-9.7, PLT-128 - 03/24/2022: WBC-8.1 (N-43%, L-17%, M-19%, B-2%), Hb-9.6, MCV-99, PLT-115 - Smear review: 13% metamyelocytes, 2% myelocytes, 2% blasts,  - 10/19/2021: WBC-5.3 (N-61, L-21, M-10%), Hb-11, MCV-96, PLT-97 - BMBX (05/25/2022): Hypercellular bone marrow with myeloid hyperplasia with dysgranulopoiesis, erythroid hypoplasia and megakaryocytic hyperplasia with dyspoiesis.  Chromosome analysis was 71, XX.  Bone marrow blasts less than 5%.  Peripheral blood blasts less than 2%. - Mayo molecular model risk stratification: Intermediate 2 risk with at least 2 points (decreased hemoglobin less than 10, circulating immature cells).  Intermediate 2 risk with median OS 31 months. - Serum EPO level 41. - NGS: Positive for CBL, MPL, SRSF2, TET 2, RAD21 - PET scan (07/08/2022): Splenomegaly (volume 510 mm) with normal metabolic activity.  Uniform increase in marrow metabolic activity related to patient's anemia.  No lymphadenopathy. - BMBX (07/16/2022): Hypercellular bone marrow (95-100%) with myeloid hyperplasia, atypical monocytes, increased blasts (16% overall).  Atypical monocytosis present 23% of total events, expressing HLA-DR, CD38, CD4 dim, CD11C, CD13, CD14, CD36, CD64 with aberrant coexpression of CD56 and CD7. - Cycle 1 of azacitidine on 08/02/2022, cycle 2 on 08/30/2022.  Azacitidine decreased to 5 days starting cycle 5 on 11/22/2022 - BMBX  (09/21/2022): 3% blasts, hypercellular marrow with markedly increased dyspoietic megakaryocytes, increased fibrosis - BMBX (11/16/2022): Hypercellular marrow (70-80%) with marked GIST [name] (, myelofibrosis and 3% blasts on a very limited sample.   2.  Social/family history: - [name] lives at home by herself.  Son lives next door. - Does part-time work at [employer].  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  Non-smoker. - Maternal aunt had tumor in the breast, patient not certain if it is cancer.  2 maternal first cousins had lymphoma.  Mother had stomach cancer.    Plan: 1.  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 - [name] has tolerated treatment very well.  No infections reported. - [name] met with Dr. Lowell Guitar on 03/25/2023.  I have reviewed records from [facility]. - Labs today: Normal LFTs and creatinine.  LDH elevated at 650.  White count is 3.8 with normal ANC.  Platelet count 117.  Hemoglobin improved to 9.1. - Recommend proceeding with cycle 9 azacitidine 75 mg/m days 1-5. - I have reached out to Dr. Lowell Guitar, who will reschedule bone marrow biopsy to be done in 2 to 3 weeks. - RTC 5 weeks for follow-up.  [name] will try using melatonin for sleep problem.   2.  Folate deficiency: - Continue folic acid tablet daily.   3.  Weight loss: - [name] is eating well.  Not requiring Megace.  [name] has lost about 3 pounds.  Will closely monitor.   4.  Constipation: - Continue Colace daily and lactulose as needed.    No orders of the defined types were placed in this encounter.     I,Katie Daubenspeck,acting as a Neurosurgeon for [employer]  Ellin Saba, MD.,have documented all relevant documentation on the behalf of Doreatha Massed, MD,as directed by  Doreatha Massed, MD while in the presence of Doreatha Massed, MD.   I, Doreatha Massed MD, have reviewed the above documentation for accuracy and  completeness, and I agree with the above.   Doreatha Massed, MD   9/30/20244:32 PM  CHIEF COMPLAINT:   Diagnosis: CMML-2    Cancer Staging  No matching staging information was found for the patient.    Prior Therapy: none  Current Therapy:  Azacitidine 70 mg/m x 7 days every 28 days    HISTORY OF PRESENT ILLNESS:   Oncology History  CMML (chronic myelomonocytic leukemia) (HCC)  06/03/2022 Initial Diagnosis   CMML (chronic myelomonocytic leukemia) (HCC)   08/02/2022 -  Chemotherapy   Patient is on Treatment Plan : MYELODYSPLASIA  Azacitidine IVPB D1-7 q28d        INTERVAL HISTORY:   [name] is a 72 y.o. female presenting to clinic today for follow up of CMML-2. [name] was last seen by me on 03/07/23.  Today, [name] states that [name] is doing well overall. [name] appetite level is at 100%. [name] energy level is at 100%.  PAST MEDICAL HISTORY:   Past Medical History: Past Medical History:  Diagnosis Date   Anxiety    Asthma    had 1 episode 1 year ago-no more problem   CHF (congestive heart failure) (HCC)    swelling of feet & ankles   CMML (chronic myelomonocytic leukemia) (HCC) 06/03/2022   Cough    Depression    OCD   GERD (gastroesophageal reflux disease)    severe   OCD (obsessive compulsive disorder)    Port-A-Cath in place 07/26/2022   Shortness of breath    with exertion   Yeast infection 04/09/2014    Surgical History: Past Surgical History:  Procedure Laterality Date   CARPAL TUNNEL RELEASE  03/22/2012   Procedure: CARPAL TUNNEL RELEASE;  Surgeon: Nicki Reaper, MD;  Location: [facility];  Service: Orthopedics;  Laterality: Right;  right carpal tunnel release   COLONOSCOPY  06/19/2012   Dr. Rourk:normal rectum and colon    ESOPHAGOGASTRODUODENOSCOPY N/A 12/26/2015   Dr. Jena Gauss: normal    IR IMAGING GUIDED PORT INSERTION  07/30/2022   TONSILLECTOMY     TUBAL LIGATION      Social History: Social History   Socioeconomic History   Marital  status: Widowed    Spouse name: Not on file   Number of children: 1   Years of education: Not on file   Highest education level: Not on file  Occupational History   Occupation: retired  Tobacco Use   Smoking status: Never    Passive exposure: Yes   Smokeless tobacco: Never  Vaping Use   Vaping status: Never Used  Substance and Sexual Activity   Alcohol use: No   Drug use: No   Sexual activity: Yes    Birth control/protection: Surgical    Comment: tubal  Other Topics Concern   Not on file  Social History Narrative   Not on file   Social Determinants of Health   Financial Resource Strain: Not on file  Food Insecurity: Not on file  Transportation Needs: Not on file  Physical Activity: Not on file  Stress: Not on file  Social Connections: Not on file  Intimate Partner Violence: Not on file    Family History: Family History  Problem Relation Age of Onset   Asthma Mother  Macular degeneration Mother    Hypertension Mother    Stomach cancer Mother 57   Alzheimer's disease Father    Other Sister        blood clots   Diabetes Maternal Grandfather    Diabetes Paternal Grandfather    Other Son        enlarged spleen   Lymphoma Cousin        x2, both dx in their 45s   Colon cancer Neg Hx     Current Medications:  Current Outpatient Medications:    albuterol (VENTOLIN HFA) 108 (90 Base) MCG/ACT inhaler, INHALE 2 PUFFS BY MOUTH EVERY 6 HOURS AS NEEDED FOR SHORTNESS OF BREATH OR WHEEZING., Disp: 18 g, Rfl: 2   albuterol (VENTOLIN HFA) 108 (90 Base) MCG/ACT inhaler, Inhale 2 puffs into the lungs every 4 (four) hours as needed for wheezing or shortness of breath., Disp: 18 g, Rfl: 1   ARNUITY ELLIPTA 200 MCG/ACT AEPB, Inhale 1 puff into the lungs daily., Disp: 30 each, Rfl: 5   azaCITIDine 5 mg/2 mLs in lactated ringers infusion, Inject into the vein daily. Days 1-7 every 28 days, Disp: , Rfl:    azelastine (ASTELIN) 0.1 % nasal spray, SPRAY 1 TO 2 SPRAYS PER NOSTRIL  TWICE DAILY., Disp: 30 mL, Rfl: 3   benzonatate (TESSALON) 200 MG capsule, Take 200 mg by mouth 3 (three) times daily as needed., Disp: , Rfl:    BREZTRI AEROSPHERE 160-9-4.8 MCG/ACT AERO, Inhale 2 puffs into the lungs 2 (two) times daily., Disp: 10.7 g, Rfl: 5   cetirizine (ZYRTEC) 5 MG tablet, Take 1 tablet (5 mg total) by mouth daily., Disp: 30 tablet, Rfl: 0   ciclopirox (PENLAC) 8 % solution, APPLY TOPICALLY AT BEDTIME. APPLY OVER NAIL FOLD AND SURROUNDING SKIN. APPLY DAILY OVER PREVIOUS COAT. AFTER SEVEN DAYS, MAY REMOVE WITH ALCOHOL AND CONTINUE CYCLE., Disp: 6.6 mL, Rfl: 2   EPINEPHrine 0.3 mg/0.3 mL IJ SOAJ injection, Inject 0.3 mg into the muscle as needed for anaphylaxis., Disp: 2 each, Rfl: 1   famotidine (PEPCID) 20 MG tablet, Take 1 tablet (20 mg total) by mouth 2 (two) times daily., Disp: 60 tablet, Rfl: 5   FLUoxetine (PROZAC) 20 MG capsule, Take 1 capsule by mouth daily., Disp: , Rfl:    fluticasone (FLONASE) 50 MCG/ACT nasal spray, Place 2 sprays into both nostrils 2 (two) times daily., Disp: 16 g, Rfl: 5   fluticasone (FLOVENT HFA) 110 MCG/ACT inhaler, Inhale two puffs twice daily to prevent cough or wheeze. Rinse mouth after use., Disp: 12 g, Rfl: 5   folic acid (FOLVITE) 1 MG tablet, TAKE ONE TABLET BY MOUTH ONCE DAILY, Disp: 30 tablet, Rfl: 5   gabapentin (NEURONTIN) 300 MG capsule, , Disp: , Rfl:    ipratropium (ATROVENT) 0.06 % nasal spray, USE 2 SPRAYS IN EACH NOSTRIL THREE TIMES A DAY AS NEEDED., Disp: 15 mL, Rfl: 5   lactulose (CHRONULAC) 10 GM/15ML solution, take 30ml BY MOUTH EVERY THREE HOURS UNTIL bowel movement; THEN take 30ml ONCE DAILY AS NEEDED FOR CONSTIPATION, Disp: 450 mL, Rfl: 2   lidocaine-prilocaine (EMLA) cream, Apply a quarter sized amount to port a cath site and cover with plastic wrap one hour prior to infusion appointments, Disp: 30 g, Rfl: 3   megestrol (MEGACE) 400 MG/10ML suspension, Take 10 mLs (400 mg total) by mouth 2 (two) times daily., Disp: 480  mL, Rfl: 2   montelukast (SINGULAIR) 10 MG tablet, Take 1 tablet (10 mg total) by mouth  at bedtime., Disp: 30 tablet, Rfl: 5   NON FORMULARY, [facility]  Antifungal (nail)-#1, Disp: , Rfl:    pantoprazole (PROTONIX) 40 MG tablet, Take 1 tablet (40 mg total) by mouth daily., Disp: 30 tablet, Rfl: 5   prochlorperazine (COMPAZINE) 10 MG tablet, Take 1 tablet (10 mg total) by mouth every 6 (six) hours as needed for nausea or vomiting., Disp: 30 tablet, Rfl: 0   Respiratory Therapy Supplies (FLUTTER) DEVI, Use as directed, Disp: 1 each, Rfl: 3   SF 5000 PLUS 1.1 % CREA dental cream, , Disp: , Rfl:    Spacer/Aero-Holding Chambers (AEROCHAMBER PLUS WITH MASK) inhaler, 1 each by Other route See admin instructions. Use with inhaler as instructed., Disp: 1 each, Rfl: 1   tiZANidine (ZANAFLEX) 2 MG tablet, Take 1 tablet (2 mg total) by mouth 2 (two) times daily as needed for muscle spasms., Disp: 15 tablet, Rfl: 0   traZODone (DESYREL) 50 MG tablet, TAKE ONE TABLET BY MOUTH AT BEDTIME, Disp: 30 tablet, Rfl: 3  Current Facility-Administered Medications:    tezepelumab-ekko (TEZSPIRE) 210 MG/1. syringe 210 mg, 210 mg, Subcutaneous, Q28 days, Marcelyn Bruins, MD, 210 mg at 04/11/23 1245   Allergies: Allergies  Allergen Reactions   Levonorgestrel-Ethinyl Estrad Cough   Sulfa Antibiotics Other (See Comments)    Unknown- pt unsure of reaction; believes it may be nausea   Sulfamethoxazole-Trimethoprim Other (See Comments)   Cephalosporins Other (See Comments)    Other Reaction: Toxicity    REVIEW OF SYSTEMS:   Review of Systems  Constitutional:  Negative for chills, fatigue and fever.  HENT:   Negative for lump/mass, mouth sores, nosebleeds, sore throat and trouble swallowing.   Eyes:  Negative for eye problems.  Respiratory:  Negative for cough and shortness of breath.   Cardiovascular:  Negative for chest pain, leg swelling and palpitations.  Gastrointestinal:  Negative for  abdominal pain, constipation, diarrhea, nausea and vomiting.  Genitourinary:  Negative for bladder incontinence, difficulty urinating, dysuria, frequency, hematuria and nocturia.   Musculoskeletal:  Negative for arthralgias, back pain, flank pain, myalgias and neck pain.  Skin:  Negative for itching and rash.  Neurological:  Negative for dizziness, headaches and numbness.  Hematological:  Does not bruise/bleed easily.  Psychiatric/Behavioral:  Positive for sleep disturbance. Negative for depression and suicidal ideas. The patient is not nervous/anxious.   All other systems reviewed and are negative.    VITALS:   Blood pressure 121/73, pulse 78, temperature 98 F (36.7 C), temperature source Oral, resp. rate 16, weight 130 lb 4.8 oz (59.1 kg), SpO2 100%.  Wt Readings from Last 3 Encounters:  04/18/23 130 lb 4.8 oz (59.1 kg)  03/07/23 133 lb 9.6 oz (60.6 kg)  01/28/23 135 lb (61.2 kg)    Body mass index is 25.45 kg/m.  Performance status (ECOG): 1 - Symptomatic but completely ambulatory  PHYSICAL EXAM:   Physical Exam Vitals and nursing note reviewed. Exam conducted with a chaperone present.  Constitutional:      Appearance: Normal appearance.  Cardiovascular:     Rate and Rhythm: Normal rate and regular rhythm.     Pulses: Normal pulses.     Heart sounds: Normal heart sounds.  Pulmonary:     Effort: Pulmonary effort is normal.     Breath sounds: Normal breath sounds.  Abdominal:     Palpations: Abdomen is soft. There is no hepatomegaly, splenomegaly or mass.     Tenderness: There is no abdominal tenderness.  Musculoskeletal:  Right lower leg: No edema.     Left lower leg: No edema.  Lymphadenopathy:     Cervical: No cervical adenopathy.     Right cervical: No superficial, deep or posterior cervical adenopathy.    Left cervical: No superficial, deep or posterior cervical adenopathy.     Upper Body:     Right upper body: No supraclavicular or axillary adenopathy.      Left upper body: No supraclavicular or axillary adenopathy.  Neurological:     General: No focal deficit present.     Mental Status: [name] is alert and oriented to person, place, and time.  Psychiatric:        Mood and Affect: Mood normal.        Behavior: Behavior normal.     LABS:      Latest Ref Rng & Units 04/18/2023   10:31 AM 03/07/2023   11:11 AM 01/24/2023   11:56 AM  CBC  WBC 4.0 - 10.5 K/uL 3.8  3.8  3.5   Hemoglobin 12.0 - 15.0 g/dL 9.1  8.8  8.6   Hematocrit 36.0 - 46.0 % 29.0  28.7  28.1   Platelets 150 - 400 K/uL 117  117  144       Latest Ref Rng & Units 04/18/2023   10:31 AM 03/07/2023   11:25 AM 01/24/2023   11:56 AM  CMP  Glucose 70 - 99 mg/dL 409  92  96   BUN 8 - 23 mg/dL 16  14  12    Creatinine 0.44 - 1.00 mg/dL 8.11  9.14  7.82   Sodium 135 - 145 mmol/L 137  135  135   Potassium 3.5 - 5.1 mmol/L 4.0  4.2  4.1   Chloride 98 - 111 mmol/L 104  103  103   CO2 22 - 32 mmol/L 25  25  26    Calcium 8.9 - 10.3 mg/dL 9.0  9.0  9.0   Total Protein 6.5 - 8.1 g/dL 7.1  7.4  7.1   Total Bilirubin 0.3 - 1.2 mg/dL 1.0  1.7  1.3   Alkaline Phos 38 - 126 U/L 76  81  71   AST 15 - 41 U/L 24  29  30    ALT 0 - 44 U/L 12  14  13       No results found for: "CEA1", "CEA" / No results found for: "CEA1", "CEA" No results found for: "PSA1" No results found for: "CAN199" No results found for: "CAN125"  Lab Results  Component Value Date   TOTALPROTELP 6.3 04/26/2022   ALBUMINELP 3.8 04/26/2022   A1GS 0.3 04/26/2022   A2GS 0.5 04/26/2022   BETS 0.8 04/26/2022   GAMS 0.9 04/26/2022   MSPIKE Not Observed 04/26/2022   SPEI Comment 04/26/2022   Lab Results  Component Value Date   TIBC 343 04/26/2022   FERRITIN 114 04/26/2022   IRONPCTSAT 25 04/26/2022   Lab Results  Component Value Date   LDH 650 (H) 04/18/2023   LDH 704 (H) 03/07/2023   LDH 900 (H) 01/24/2023     STUDIES:   No results found.

## 2023-04-18 NOTE — Progress Notes (Signed)
Message received from A.Anderson RN/ Dr. Theo Dills to proceed with treatment. Patient seen by Dr. Ellin Saba . Labs reviewed by MD. Vital signs and lab work within parameters.   Vidaza given today per MD orders. Tolerated infusion without adverse affects. Vital signs stable. No complaints at this time. Discharged from clinic ambulatory in stable condition. Alert and oriented x 3. F/U with [facility] as scheduled.

## 2023-04-18 NOTE — Patient Instructions (Signed)
[facility] [place]  Discharge Instructions: Thank you for choosing [facility] to provide your oncology and hematology care.  If you have a lab appointment with the Cancer Center - please note that after April 8th, 2024, all labs will be drawn in the cancer center.  You do not have to check in or register with the main entrance as you have in the past but will complete your check-in in the cancer center.  Wear comfortable clothing and clothing appropriate for easy access to any Portacath or PICC line.   We strive to give you quality time with your provider. You may need to reschedule your appointment if you arrive late (15 or more minutes).  Arriving late affects you and other patients whose appointments are after yours.  Also, if you miss three or more appointments without notifying the office, you may be dismissed from the clinic at the provider's discretion.      For prescription refill requests, have your pharmacy contact our office and allow 72 hours for refills to be completed.    Today you received the following chemotherapy and/or immunotherapy agents Vidaza.  Azacitidine Injection What is this medication? AZACITIDINE (ay za SITE i deen) treats blood and bone marrow cancers. It works by slowing down the growth of cancer cells. This medicine may be used for other purposes; ask your health care provider or pharmacist if you have questions. COMMON BRAND NAME(S): Vidaza What should I tell my care team before I take this medication? They need to know if you have any of these conditions: Kidney disease Liver disease Low blood cell levels, such as low white cells, platelets, or red blood cells Low levels of albumin in the blood Low levels of bicarbonate in the blood An unusual or allergic reaction to azacitidine, mannitol, other medications, foods, dyes, or preservatives If you or your partner are pregnant or trying to get pregnant Breast-feeding How should I  use this medication? This medication is injected into a vein or under the skin. It is given by your care team in a hospital or clinic setting. Talk to your care team about the use of this medication in children. While it may be prescribed for children as young as 1 month for selected conditions, precautions do apply. Overdosage: If you think you have taken too much of this medicine contact a poison control center or emergency room at once. NOTE: This medicine is only for you. Do not share this medicine with others. What if I miss a dose? Keep appointments for follow-up doses. It is important not to miss your dose. Call your care team if you are unable to keep an appointment. What may interact with this medication? Interactions are not expected. This list may not describe all possible interactions. Give your health care provider a list of all the medicines, herbs, non-prescription drugs, or dietary supplements you use. Also tell them if you smoke, drink alcohol, or use illegal drugs. Some items may interact with your medicine. What should I watch for while using this medication? Your condition will be monitored carefully while you are receiving this medication. You may need blood work while taking this medication. This medication may make you feel generally unwell. This is not uncommon as chemotherapy can affect healthy cells as well as cancer cells. Report any side effects. Continue your course of treatment even though you feel ill unless your care team tells you to stop. Other product types may be available that contain the   medication azacitidine. The injection and oral products should not be used in place of one another. Talk to your care team if you have questions. This medication can cause serious side effects. To reduce the risk, your care team may give you other medications to take before receiving this one. Be sure to follow the directions from your care team. This medication may increase your  risk of getting an infection. Call your care team for advice if you get a fever, chills, sore throat, or other symptoms of a cold or flu. Do not treat yourself. Try to avoid being around people who are sick. Avoid taking medications that contain aspirin, acetaminophen, ibuprofen, naproxen, or ketoprofen unless instructed by your care team. These medications may hide a fever. This medication may increase your risk to bruise or bleed. Call your care team if you notice any unusual bleeding. Be careful brushing or flossing your teeth or using a toothpick because you may get an infection or bleed more easily. If you have any dental work done, tell your dentist you are receiving this medication. Talk to your care team if you or your partner may be pregnant. Serious birth defects can occur if you take this medication during pregnancy and for 6 months after the last dose. You will need a negative pregnancy test before starting this medication. Contraception is recommended while taking his medication and for 6 months after the last dose. Your care team can help you find the option that works for you. If your partner can get pregnant, use a condom during sex while taking this medication and for 3 months after the last dose. Do not breastfeed while taking this medication and for 1 week after the last dose. This medication may cause infertility. Talk to your care team if you are concerned about your fertility. What side effects may I notice from receiving this medication? Side effects that you should report to your care team as soon as possible: Allergic reactions--skin rash, itching, hives, swelling of the face, lips, tongue, or throat Infection--fever, chills, cough, sore throat, wounds that don't heal, pain or trouble when passing urine, general feeling of discomfort or being unwell Kidney injury--decrease in the amount of urine, swelling of the ankles, hands, or feet Liver injury--right upper belly pain, loss  of appetite, nausea, light-colored stool, dark yellow or brown urine, yellowing skin or eyes, unusual weakness or fatigue Low red blood cell level--unusual weakness or fatigue, dizziness, headache, trouble breathing Tumor lysis syndrome (TLS)--nausea, vomiting, diarrhea, decrease in the amount of urine, dark urine, unusual weakness or fatigue, confusion, muscle pain or cramps, fast or irregular heartbeat, joint pain Unusual bruising or bleeding Side effects that usually do not require medical attention (report to your care team if they continue or are bothersome): Constipation Diarrhea Nausea Pain, redness, or irritation at injection site Vomiting This list may not describe all possible side effects. Call your doctor for medical advice about side effects. You may report side effects to FDA at 1-800-FDA-1088. Where should I keep my medication? This medication is given in a hospital or clinic. It will not be stored at home. NOTE: This sheet is a summary. It may not cover all possible information. If you have questions about this medicine, talk to your doctor, pharmacist, or health care provider.  2024 Elsevier/Gold Standard (2021-11-19 00:00:00)        To help prevent nausea and vomiting after your treatment, we encourage you to take your nausea medication as directed.  BELOW ARE SYMPTOMS   THAT SHOULD BE REPORTED IMMEDIATELY: *FEVER GREATER THAN 100.4 F (38 C) OR HIGHER *CHILLS OR SWEATING *NAUSEA AND VOMITING THAT IS NOT CONTROLLED WITH YOUR NAUSEA MEDICATION *UNUSUAL SHORTNESS OF BREATH *UNUSUAL BRUISING OR BLEEDING *URINARY PROBLEMS (pain or burning when urinating, or frequent urination) *BOWEL PROBLEMS (unusual diarrhea, constipation, pain near the anus) TENDERNESS IN MOUTH AND THROAT WITH OR WITHOUT PRESENCE OF ULCERS (sore throat, sores in mouth, or a toothache) UNUSUAL RASH, SWELLING OR PAIN  UNUSUAL VAGINAL DISCHARGE OR ITCHING   Items with * indicate a potential emergency and  should be followed up as soon as possible or go to the Emergency Department if any problems should occur.  Please show the CHEMOTHERAPY ALERT CARD or IMMUNOTHERAPY ALERT CARD at check-in to the Emergency Department and triage nurse.  Should you have questions after your visit or need to cancel or reschedule your appointment, please contact [facility] [place] [phone]  and follow the prompts.  Office hours are 8:00 a.m. to 4:30 p.m. Monday - Friday. Please note that voicemails left after 4:00 p.m. may not be returned until the following business day.  We are closed weekends and major holidays. You have access to a nurse at all times for urgent questions. Please call the main number to the clinic [phone] and follow the prompts.  For any non-urgent questions, you may also contact your provider using MyChart. We now offer e-Visits for anyone 18 and older to request care online for non-urgent symptoms. For details visit [email or web address].   Also download the MyChart app! Go to the app store, search "MyChart", open the app, select [facility], and log in with your MyChart username and password.   

## 2023-04-19 ENCOUNTER — Other Ambulatory Visit: Payer: Self-pay

## 2023-04-19 ENCOUNTER — Inpatient Hospital Stay: Payer: Medicare HMO | Attending: Hematology

## 2023-04-19 VITALS — BP 94/62 | HR 70 | Temp 98.1°F | Resp 18

## 2023-04-19 DIAGNOSIS — K219 Gastro-esophageal reflux disease without esophagitis: Secondary | ICD-10-CM | POA: Insufficient documentation

## 2023-04-19 DIAGNOSIS — Z8249 Family history of ischemic heart disease and other diseases of the circulatory system: Secondary | ICD-10-CM | POA: Diagnosis not present

## 2023-04-19 DIAGNOSIS — Z800 Family history of malignant neoplasm of digestive organs: Secondary | ICD-10-CM | POA: Insufficient documentation

## 2023-04-19 DIAGNOSIS — Z881 Allergy status to other antibiotic agents status: Secondary | ICD-10-CM | POA: Insufficient documentation

## 2023-04-19 DIAGNOSIS — R161 Splenomegaly, not elsewhere classified: Secondary | ICD-10-CM | POA: Diagnosis not present

## 2023-04-19 DIAGNOSIS — D696 Thrombocytopenia, unspecified: Secondary | ICD-10-CM | POA: Diagnosis not present

## 2023-04-19 DIAGNOSIS — D539 Nutritional anemia, unspecified: Secondary | ICD-10-CM | POA: Diagnosis not present

## 2023-04-19 DIAGNOSIS — Z882 Allergy status to sulfonamides status: Secondary | ICD-10-CM | POA: Insufficient documentation

## 2023-04-19 DIAGNOSIS — Z833 Family history of diabetes mellitus: Secondary | ICD-10-CM | POA: Insufficient documentation

## 2023-04-19 DIAGNOSIS — Z83518 Family history of other specified eye disorder: Secondary | ICD-10-CM | POA: Insufficient documentation

## 2023-04-19 DIAGNOSIS — Z79899 Other long term (current) drug therapy: Secondary | ICD-10-CM | POA: Diagnosis not present

## 2023-04-19 DIAGNOSIS — Z818 Family history of other mental and behavioral disorders: Secondary | ICD-10-CM | POA: Insufficient documentation

## 2023-04-19 DIAGNOSIS — K5900 Constipation, unspecified: Secondary | ICD-10-CM | POA: Diagnosis not present

## 2023-04-19 DIAGNOSIS — Z9089 Acquired absence of other organs: Secondary | ICD-10-CM | POA: Diagnosis not present

## 2023-04-19 DIAGNOSIS — Z825 Family history of asthma and other chronic lower respiratory diseases: Secondary | ICD-10-CM | POA: Diagnosis not present

## 2023-04-19 DIAGNOSIS — G478 Other sleep disorders: Secondary | ICD-10-CM | POA: Diagnosis not present

## 2023-04-19 DIAGNOSIS — Z5111 Encounter for antineoplastic chemotherapy: Secondary | ICD-10-CM | POA: Diagnosis not present

## 2023-04-19 DIAGNOSIS — Z807 Family history of other malignant neoplasms of lymphoid, hematopoietic and related tissues: Secondary | ICD-10-CM | POA: Insufficient documentation

## 2023-04-19 DIAGNOSIS — E538 Deficiency of other specified B group vitamins: Secondary | ICD-10-CM | POA: Diagnosis not present

## 2023-04-19 DIAGNOSIS — R634 Abnormal weight loss: Secondary | ICD-10-CM | POA: Diagnosis not present

## 2023-04-19 DIAGNOSIS — C9310 Chronic myelomonocytic leukemia not having achieved remission: Secondary | ICD-10-CM | POA: Insufficient documentation

## 2023-04-19 DIAGNOSIS — Z95828 Presence of other vascular implants and grafts: Secondary | ICD-10-CM

## 2023-04-19 MED ORDER — HEPARIN SOD (PORK) LOCK FLUSH 100 UNIT/ML IV SOLN
500.0000 [IU] | Freq: Once | INTRAVENOUS | Status: AC
  Administered 2023-04-19: 500 [IU] via INTRAVENOUS

## 2023-04-19 MED ORDER — SODIUM CHLORIDE 0.9 % IV SOLN
75.0000 mg/m2 | Freq: Once | INTRAVENOUS | Status: AC
  Administered 2023-04-19: 126 mg via INTRAVENOUS
  Filled 2023-04-19: qty 12.6

## 2023-04-19 MED ORDER — SODIUM CHLORIDE 0.9 % IV SOLN: Freq: Once | INTRAVENOUS | Status: AC

## 2023-04-19 MED ORDER — SODIUM CHLORIDE 0.9% FLUSH
10.0000 mL | INTRAVENOUS | Status: DC | PRN
  Administered 2023-04-19: 10 mL

## 2023-04-19 NOTE — Patient Instructions (Signed)
[facility]  Discharge Instructions: Thank you for choosing [facility] to provide your oncology and hematology care.  If you have a lab appointment with the Cancer Center - please note that after April 8th, 2024, all labs will be drawn in the cancer center.  You do not have to check in or register with the main entrance as you have in the past but will complete your check-in in the cancer center.  Wear comfortable clothing and clothing appropriate for easy access to any Portacath or PICC line.   We strive to give you quality time with your provider. You may need to reschedule your appointment if you arrive late (15 or more minutes).  Arriving late affects you and other patients whose appointments are after yours.  Also, if you miss three or more appointments without notifying the office, you may be dismissed from the clinic at the provider's discretion.      For prescription refill requests, have your pharmacy contact our office and allow 72 hours for refills to be completed.    Today you received the following chemotherapy and/or immunotherapy agents D2 Vidaza   To help prevent nausea and vomiting after your treatment, we encourage you to take your nausea medication as directed.  BELOW ARE SYMPTOMS THAT SHOULD BE REPORTED IMMEDIATELY: *FEVER GREATER THAN 100.4 F (38 C) OR HIGHER *CHILLS OR SWEATING *NAUSEA AND VOMITING THAT IS NOT CONTROLLED WITH YOUR NAUSEA MEDICATION *UNUSUAL SHORTNESS OF BREATH *UNUSUAL BRUISING OR BLEEDING *URINARY PROBLEMS (pain or burning when urinating, or frequent urination) *BOWEL PROBLEMS (unusual diarrhea, constipation, pain near the anus) TENDERNESS IN MOUTH AND THROAT WITH OR WITHOUT PRESENCE OF ULCERS (sore throat, sores in mouth, or a toothache) UNUSUAL RASH, SWELLING OR PAIN  UNUSUAL VAGINAL DISCHARGE OR ITCHING   Items with * indicate a potential emergency and should be followed up as soon as possible or go to the  Emergency Department if any problems should occur.  Please show the CHEMOTHERAPY ALERT CARD or IMMUNOTHERAPY ALERT CARD at check-in to the Emergency Department and triage nurse.  Should you have questions after your visit or need to cancel or reschedule your appointment, please contact [facility] [phone]  and follow the prompts.  Office hours are 8:00 a.m. to 4:30 p.m. Monday - Friday. Please note that voicemails left after 4:00 p.m. may not be returned until the following business day.  We are closed weekends and major holidays. You have access to a nurse at all times for urgent questions. Please call the main number to the clinic [phone] and follow the prompts.  For any non-urgent questions, you may also contact your provider using MyChart. We now offer e-Visits for anyone 55 and older to request care online for non-urgent symptoms. For details visit [email or web address].   Also download the MyChart app! Go to the app store, search "MyChart", open the app, select [facility], and log in with your MyChart username and password.

## 2023-04-19 NOTE — Progress Notes (Signed)
Patient presents today for D2 Vidaza infusion.  Patient is in satisfactory condition with no new complaints voiced.  Vital signs are stable. Labs reviewed from 04/18/23 and all labs are within treatment parameters.  We will proceed with treatment per MD orders.    Treatment given today per MD orders. Tolerated infusion without adverse affects. Vital signs stable. No complaints at this time. Discharged from clinic ambulatory in stable condition. Alert and oriented x 3. F/U with [facility] as scheduled.

## 2023-04-20 ENCOUNTER — Inpatient Hospital Stay: Payer: Medicare HMO

## 2023-04-20 VITALS — BP 92/63 | HR 68 | Temp 97.8°F | Resp 18

## 2023-04-20 DIAGNOSIS — E538 Deficiency of other specified B group vitamins: Secondary | ICD-10-CM | POA: Diagnosis not present

## 2023-04-20 DIAGNOSIS — C9310 Chronic myelomonocytic leukemia not having achieved remission: Secondary | ICD-10-CM | POA: Diagnosis not present

## 2023-04-20 DIAGNOSIS — K219 Gastro-esophageal reflux disease without esophagitis: Secondary | ICD-10-CM | POA: Diagnosis not present

## 2023-04-20 DIAGNOSIS — D696 Thrombocytopenia, unspecified: Secondary | ICD-10-CM | POA: Diagnosis not present

## 2023-04-20 DIAGNOSIS — Z5111 Encounter for antineoplastic chemotherapy: Secondary | ICD-10-CM | POA: Diagnosis not present

## 2023-04-20 DIAGNOSIS — K5900 Constipation, unspecified: Secondary | ICD-10-CM | POA: Diagnosis not present

## 2023-04-20 DIAGNOSIS — Z95828 Presence of other vascular implants and grafts: Secondary | ICD-10-CM

## 2023-04-20 DIAGNOSIS — D539 Nutritional anemia, unspecified: Secondary | ICD-10-CM | POA: Diagnosis not present

## 2023-04-20 DIAGNOSIS — R634 Abnormal weight loss: Secondary | ICD-10-CM | POA: Diagnosis not present

## 2023-04-20 DIAGNOSIS — R161 Splenomegaly, not elsewhere classified: Secondary | ICD-10-CM | POA: Diagnosis not present

## 2023-04-20 MED ORDER — PALONOSETRON HCL INJECTION 0.25 MG/5ML
0.2500 mg | Freq: Once | INTRAVENOUS | Status: AC
  Administered 2023-04-20: 0.25 mg via INTRAVENOUS
  Filled 2023-04-20: qty 5

## 2023-04-20 MED ORDER — SODIUM CHLORIDE 0.9 % IV SOLN
75.0000 mg/m2 | Freq: Once | INTRAVENOUS | Status: AC
  Administered 2023-04-20: 126 mg via INTRAVENOUS
  Filled 2023-04-20: qty 12.6

## 2023-04-20 MED ORDER — HEPARIN SOD (PORK) LOCK FLUSH 100 UNIT/ML IV SOLN
500.0000 [IU] | Freq: Once | INTRAVENOUS | Status: AC
  Administered 2023-04-20: 500 [IU] via INTRAVENOUS

## 2023-04-20 MED ORDER — SODIUM CHLORIDE 0.9% FLUSH
10.0000 mL | Freq: Once | INTRAVENOUS | Status: AC
  Administered 2023-04-20: 10 mL via INTRAVENOUS

## 2023-04-20 MED ORDER — SODIUM CHLORIDE 0.9 % IV SOLN: Freq: Once | INTRAVENOUS | Status: AC

## 2023-04-20 NOTE — Progress Notes (Signed)
Patient presents today for Vidaza infusion per providers order.  Vital signs WNL.  Patient has no new complaints at this time.  Stable during infusion without adverse affects.  Vital signs stable.  No complaints at this time.  Discharge from clinic ambulatory in stable condition.  Alert and oriented X 3.  Follow up with [facility] as scheduled.

## 2023-04-21 ENCOUNTER — Inpatient Hospital Stay: Payer: Medicare HMO

## 2023-04-21 VITALS — BP 90/54 | HR 79 | Temp 97.3°F | Resp 18

## 2023-04-21 DIAGNOSIS — C9310 Chronic myelomonocytic leukemia not having achieved remission: Secondary | ICD-10-CM

## 2023-04-21 DIAGNOSIS — K5900 Constipation, unspecified: Secondary | ICD-10-CM | POA: Diagnosis not present

## 2023-04-21 DIAGNOSIS — R634 Abnormal weight loss: Secondary | ICD-10-CM | POA: Diagnosis not present

## 2023-04-21 DIAGNOSIS — K219 Gastro-esophageal reflux disease without esophagitis: Secondary | ICD-10-CM | POA: Diagnosis not present

## 2023-04-21 DIAGNOSIS — E538 Deficiency of other specified B group vitamins: Secondary | ICD-10-CM | POA: Diagnosis not present

## 2023-04-21 DIAGNOSIS — D696 Thrombocytopenia, unspecified: Secondary | ICD-10-CM | POA: Diagnosis not present

## 2023-04-21 DIAGNOSIS — R161 Splenomegaly, not elsewhere classified: Secondary | ICD-10-CM | POA: Diagnosis not present

## 2023-04-21 DIAGNOSIS — Z5111 Encounter for antineoplastic chemotherapy: Secondary | ICD-10-CM | POA: Diagnosis not present

## 2023-04-21 DIAGNOSIS — Z95828 Presence of other vascular implants and grafts: Secondary | ICD-10-CM

## 2023-04-21 DIAGNOSIS — D539 Nutritional anemia, unspecified: Secondary | ICD-10-CM | POA: Diagnosis not present

## 2023-04-21 MED ORDER — HEPARIN SOD (PORK) LOCK FLUSH 100 UNIT/ML IV SOLN
500.0000 [IU] | Freq: Once | INTRAVENOUS | Status: AC
  Administered 2023-04-21: 500 [IU] via INTRAVENOUS

## 2023-04-21 MED ORDER — SODIUM CHLORIDE 0.9 % IV SOLN: Freq: Once | INTRAVENOUS | Status: AC

## 2023-04-21 MED ORDER — SODIUM CHLORIDE 0.9 % IV SOLN
75.0000 mg/m2 | Freq: Once | INTRAVENOUS | Status: AC
  Administered 2023-04-21: 126 mg via INTRAVENOUS
  Filled 2023-04-21: qty 12.6

## 2023-04-21 NOTE — Progress Notes (Signed)
Patient presents today for Vidaza D4 C9. Vitals signs within parameters for treatment. Patient denies any side effects related to treatment. No complaints noted to day of any changes since yesterday's treatment.   Treatment given today per MD orders. Tolerated infusion without adverse affects. Vital signs stable. No complaints at this time. Discharged from clinic ambulatory in stable condition. Alert and oriented x 3. F/U with [facility] as scheduled.

## 2023-04-21 NOTE — Patient Instructions (Signed)
[facility] [place]  Discharge Instructions: Thank you for choosing [facility] to provide your oncology and hematology care.  If you have a lab appointment with the Cancer Center - please note that after April 8th, 2024, all labs will be drawn in the cancer center.  You do not have to check in or register with the main entrance as you have in the past but will complete your check-in in the cancer center.  Wear comfortable clothing and clothing appropriate for easy access to any Portacath or PICC line.   We strive to give you quality time with your provider. You may need to reschedule your appointment if you arrive late (15 or more minutes).  Arriving late affects you and other patients whose appointments are after yours.  Also, if you miss three or more appointments without notifying the office, you may be dismissed from the clinic at the provider's discretion.      For prescription refill requests, have your pharmacy contact our office and allow 72 hours for refills to be completed.    Today you received the following chemotherapy and/or immunotherapy agents Vidaza.  Azacitidine Injection What is this medication? AZACITIDINE (ay za SITE i deen) treats blood and bone marrow cancers. It works by slowing down the growth of cancer cells. This medicine may be used for other purposes; ask your health care provider or pharmacist if you have questions. COMMON BRAND NAME(S): Vidaza What should I tell my care team before I take this medication? They need to know if you have any of these conditions: Kidney disease Liver disease Low blood cell levels, such as low white cells, platelets, or red blood cells Low levels of albumin in the blood Low levels of bicarbonate in the blood An unusual or allergic reaction to azacitidine, mannitol, other medications, foods, dyes, or preservatives If you or your partner are pregnant or trying to get pregnant Breast-feeding How should I  use this medication? This medication is injected into a vein or under the skin. It is given by your care team in a hospital or clinic setting. Talk to your care team about the use of this medication in children. While it may be prescribed for children as young as 1 month for selected conditions, precautions do apply. Overdosage: If you think you have taken too much of this medicine contact a poison control center or emergency room at once. NOTE: This medicine is only for you. Do not share this medicine with others. What if I miss a dose? Keep appointments for follow-up doses. It is important not to miss your dose. Call your care team if you are unable to keep an appointment. What may interact with this medication? Interactions are not expected. This list may not describe all possible interactions. Give your health care provider a list of all the medicines, herbs, non-prescription drugs, or dietary supplements you use. Also tell them if you smoke, drink alcohol, or use illegal drugs. Some items may interact with your medicine. What should I watch for while using this medication? Your condition will be monitored carefully while you are receiving this medication. You may need blood work while taking this medication. This medication may make you feel generally unwell. This is not uncommon as chemotherapy can affect healthy cells as well as cancer cells. Report any side effects. Continue your course of treatment even though you feel ill unless your care team tells you to stop. Other product types may be available that contain the   medication azacitidine. The injection and oral products should not be used in place of one another. Talk to your care team if you have questions. This medication can cause serious side effects. To reduce the risk, your care team may give you other medications to take before receiving this one. Be sure to follow the directions from your care team. This medication may increase your  risk of getting an infection. Call your care team for advice if you get a fever, chills, sore throat, or other symptoms of a cold or flu. Do not treat yourself. Try to avoid being around people who are sick. Avoid taking medications that contain aspirin, acetaminophen, ibuprofen, naproxen, or ketoprofen unless instructed by your care team. These medications may hide a fever. This medication may increase your risk to bruise or bleed. Call your care team if you notice any unusual bleeding. Be careful brushing or flossing your teeth or using a toothpick because you may get an infection or bleed more easily. If you have any dental work done, tell your dentist you are receiving this medication. Talk to your care team if you or your partner may be pregnant. Serious birth defects can occur if you take this medication during pregnancy and for 6 months after the last dose. You will need a negative pregnancy test before starting this medication. Contraception is recommended while taking his medication and for 6 months after the last dose. Your care team can help you find the option that works for you. If your partner can get pregnant, use a condom during sex while taking this medication and for 3 months after the last dose. Do not breastfeed while taking this medication and for 1 week after the last dose. This medication may cause infertility. Talk to your care team if you are concerned about your fertility. What side effects may I notice from receiving this medication? Side effects that you should report to your care team as soon as possible: Allergic reactions--skin rash, itching, hives, swelling of the face, lips, tongue, or throat Infection--fever, chills, cough, sore throat, wounds that don't heal, pain or trouble when passing urine, general feeling of discomfort or being unwell Kidney injury--decrease in the amount of urine, swelling of the ankles, hands, or feet Liver injury--right upper belly pain, loss  of appetite, nausea, light-colored stool, dark yellow or brown urine, yellowing skin or eyes, unusual weakness or fatigue Low red blood cell level--unusual weakness or fatigue, dizziness, headache, trouble breathing Tumor lysis syndrome (TLS)--nausea, vomiting, diarrhea, decrease in the amount of urine, dark urine, unusual weakness or fatigue, confusion, muscle pain or cramps, fast or irregular heartbeat, joint pain Unusual bruising or bleeding Side effects that usually do not require medical attention (report to your care team if they continue or are bothersome): Constipation Diarrhea Nausea Pain, redness, or irritation at injection site Vomiting This list may not describe all possible side effects. Call your doctor for medical advice about side effects. You may report side effects to FDA at 1-800-FDA-1088. Where should I keep my medication? This medication is given in a hospital or clinic. It will not be stored at home. NOTE: This sheet is a summary. It may not cover all possible information. If you have questions about this medicine, talk to your doctor, pharmacist, or health care provider.  2024 Elsevier/Gold Standard (2021-11-19 00:00:00)        To help prevent nausea and vomiting after your treatment, we encourage you to take your nausea medication as directed.  BELOW ARE SYMPTOMS   THAT SHOULD BE REPORTED IMMEDIATELY: *FEVER GREATER THAN 100.4 F (38 C) OR HIGHER *CHILLS OR SWEATING *NAUSEA AND VOMITING THAT IS NOT CONTROLLED WITH YOUR NAUSEA MEDICATION *UNUSUAL SHORTNESS OF BREATH *UNUSUAL BRUISING OR BLEEDING *URINARY PROBLEMS (pain or burning when urinating, or frequent urination) *BOWEL PROBLEMS (unusual diarrhea, constipation, pain near the anus) TENDERNESS IN MOUTH AND THROAT WITH OR WITHOUT PRESENCE OF ULCERS (sore throat, sores in mouth, or a toothache) UNUSUAL RASH, SWELLING OR PAIN  UNUSUAL VAGINAL DISCHARGE OR ITCHING   Items with * indicate a potential emergency and  should be followed up as soon as possible or go to the Emergency Department if any problems should occur.  Please show the CHEMOTHERAPY ALERT CARD or IMMUNOTHERAPY ALERT CARD at check-in to the Emergency Department and triage nurse.  Should you have questions after your visit or need to cancel or reschedule your appointment, please contact [facility] [place] [phone]  and follow the prompts.  Office hours are 8:00 a.m. to 4:30 p.m. Monday - Friday. Please note that voicemails left after 4:00 p.m. may not be returned until the following business day.  We are closed weekends and major holidays. You have access to a nurse at all times for urgent questions. Please call the main number to the clinic [phone] and follow the prompts.  For any non-urgent questions, you may also contact your provider using MyChart. We now offer e-Visits for anyone 18 and older to request care online for non-urgent symptoms. For details visit [email or web address].   Also download the MyChart app! Go to the app store, search "MyChart", open the app, select [facility], and log in with your MyChart username and password.   

## 2023-04-22 ENCOUNTER — Inpatient Hospital Stay: Payer: Medicare HMO

## 2023-04-22 VITALS — BP 104/64 | HR 79 | Temp 97.7°F | Resp 18

## 2023-04-22 DIAGNOSIS — K219 Gastro-esophageal reflux disease without esophagitis: Secondary | ICD-10-CM | POA: Diagnosis not present

## 2023-04-22 DIAGNOSIS — D539 Nutritional anemia, unspecified: Secondary | ICD-10-CM | POA: Diagnosis not present

## 2023-04-22 DIAGNOSIS — R161 Splenomegaly, not elsewhere classified: Secondary | ICD-10-CM | POA: Diagnosis not present

## 2023-04-22 DIAGNOSIS — K5900 Constipation, unspecified: Secondary | ICD-10-CM | POA: Diagnosis not present

## 2023-04-22 DIAGNOSIS — Z5111 Encounter for antineoplastic chemotherapy: Secondary | ICD-10-CM | POA: Diagnosis not present

## 2023-04-22 DIAGNOSIS — Z95828 Presence of other vascular implants and grafts: Secondary | ICD-10-CM

## 2023-04-22 DIAGNOSIS — E538 Deficiency of other specified B group vitamins: Secondary | ICD-10-CM | POA: Diagnosis not present

## 2023-04-22 DIAGNOSIS — D696 Thrombocytopenia, unspecified: Secondary | ICD-10-CM | POA: Diagnosis not present

## 2023-04-22 DIAGNOSIS — R634 Abnormal weight loss: Secondary | ICD-10-CM | POA: Diagnosis not present

## 2023-04-22 DIAGNOSIS — C9310 Chronic myelomonocytic leukemia not having achieved remission: Secondary | ICD-10-CM | POA: Diagnosis not present

## 2023-04-22 MED ORDER — SODIUM CHLORIDE 0.9 % IV SOLN
75.0000 mg/m2 | Freq: Once | INTRAVENOUS | Status: AC
  Administered 2023-04-22: 126 mg via INTRAVENOUS
  Filled 2023-04-22: qty 12.6

## 2023-04-22 MED ORDER — SODIUM CHLORIDE 0.9% FLUSH
10.0000 mL | INTRAVENOUS | Status: DC | PRN
  Administered 2023-04-22: 10 mL via INTRAVENOUS

## 2023-04-22 MED ORDER — PALONOSETRON HCL INJECTION 0.25 MG/5ML
0.2500 mg | Freq: Once | INTRAVENOUS | Status: AC
  Administered 2023-04-22: 0.25 mg via INTRAVENOUS
  Filled 2023-04-22: qty 5

## 2023-04-22 MED ORDER — HEPARIN SOD (PORK) LOCK FLUSH 100 UNIT/ML IV SOLN
500.0000 [IU] | Freq: Once | INTRAVENOUS | Status: AC
  Administered 2023-04-22: 500 [IU] via INTRAVENOUS

## 2023-04-22 MED ORDER — SODIUM CHLORIDE 0.9 % IV SOLN: INTRAVENOUS | Status: DC

## 2023-04-22 NOTE — Progress Notes (Signed)
Patient presents today for D5 Vidaza per provider's order. Vital signs stable and pt voiced no new complaints at this time.  Treatment given today per MD orders. Tolerated infusion without adverse affects. Vital signs stable. No complaints at this time. Discharged from clinic ambulatory in stable condition. Alert and oriented x 3. F/U with [facility] as scheduled.

## 2023-04-22 NOTE — Patient Instructions (Signed)
[facility]  Discharge Instructions: Thank you for choosing [facility] to provide your oncology and hematology care.  If you have a lab appointment with the Cancer Center - please note that after April 8th, 2024, all labs will be drawn in the cancer center.  You do not have to check in or register with the main entrance as you have in the past but will complete your check-in in the cancer center.  Wear comfortable clothing and clothing appropriate for easy access to any Portacath or PICC line.   We strive to give you quality time with your provider. You may need to reschedule your appointment if you arrive late (15 or more minutes).  Arriving late affects you and other patients whose appointments are after yours.  Also, if you miss three or more appointments without notifying the office, you may be dismissed from the clinic at the provider's discretion.      For prescription refill requests, have your pharmacy contact our office and allow 72 hours for refills to be completed.    Today you received the following chemotherapy and/or immunotherapy agents D5 Vidaza   To help prevent nausea and vomiting after your treatment, we encourage you to take your nausea medication as directed.  BELOW ARE SYMPTOMS THAT SHOULD BE REPORTED IMMEDIATELY: *FEVER GREATER THAN 100.4 F (38 C) OR HIGHER *CHILLS OR SWEATING *NAUSEA AND VOMITING THAT IS NOT CONTROLLED WITH YOUR NAUSEA MEDICATION *UNUSUAL SHORTNESS OF BREATH *UNUSUAL BRUISING OR BLEEDING *URINARY PROBLEMS (pain or burning when urinating, or frequent urination) *BOWEL PROBLEMS (unusual diarrhea, constipation, pain near the anus) TENDERNESS IN MOUTH AND THROAT WITH OR WITHOUT PRESENCE OF ULCERS (sore throat, sores in mouth, or a toothache) UNUSUAL RASH, SWELLING OR PAIN  UNUSUAL VAGINAL DISCHARGE OR ITCHING   Items with * indicate a potential emergency and should be followed up as soon as possible or go to the  Emergency Department if any problems should occur.  Please show the CHEMOTHERAPY ALERT CARD or IMMUNOTHERAPY ALERT CARD at check-in to the Emergency Department and triage nurse.  Should you have questions after your visit or need to cancel or reschedule your appointment, please contact [facility] [phone]  and follow the prompts.  Office hours are 8:00 a.m. to 4:30 p.m. Monday - Friday. Please note that voicemails left after 4:00 p.m. may not be returned until the following business day.  We are closed weekends and major holidays. You have access to a nurse at all times for urgent questions. Please call the main number to the clinic [phone] and follow the prompts.  For any non-urgent questions, you may also contact your provider using MyChart. We now offer e-Visits for anyone 78 and older to request care online for non-urgent symptoms. For details visit [email or web address].   Also download the MyChart app! Go to the app store, search "MyChart", open the app, select [place], and log in with your MyChart username and password.

## 2023-05-06 DIAGNOSIS — C9200 Acute myeloblastic leukemia, not having achieved remission: Secondary | ICD-10-CM | POA: Diagnosis not present

## 2023-05-06 DIAGNOSIS — C9310 Chronic myelomonocytic leukemia not having achieved remission: Secondary | ICD-10-CM | POA: Diagnosis not present

## 2023-05-09 ENCOUNTER — Ambulatory Visit: Payer: Medicare HMO

## 2023-05-10 ENCOUNTER — Other Ambulatory Visit: Payer: Self-pay | Admitting: *Deleted

## 2023-05-10 MED ORDER — TEZSPIRE 210 MG/1.91ML ~~LOC~~ SOAJ: 210.0000 mg | SUBCUTANEOUS | 11 refills | Status: DC

## 2023-05-11 ENCOUNTER — Ambulatory Visit: Payer: Medicare HMO

## 2023-05-11 DIAGNOSIS — J4540 Moderate persistent asthma, uncomplicated: Secondary | ICD-10-CM | POA: Diagnosis not present

## 2023-05-12 ENCOUNTER — Other Ambulatory Visit: Payer: Self-pay

## 2023-05-14 ENCOUNTER — Other Ambulatory Visit: Payer: Self-pay

## 2023-05-16 ENCOUNTER — Inpatient Hospital Stay: Payer: Medicare HMO

## 2023-05-16 ENCOUNTER — Inpatient Hospital Stay: Payer: Medicare HMO | Admitting: Hematology

## 2023-05-16 VITALS — BP 97/58 | HR 66 | Temp 97.0°F | Resp 18

## 2023-05-16 DIAGNOSIS — C9310 Chronic myelomonocytic leukemia not having achieved remission: Secondary | ICD-10-CM

## 2023-05-16 DIAGNOSIS — R161 Splenomegaly, not elsewhere classified: Secondary | ICD-10-CM | POA: Diagnosis not present

## 2023-05-16 DIAGNOSIS — K219 Gastro-esophageal reflux disease without esophagitis: Secondary | ICD-10-CM | POA: Diagnosis not present

## 2023-05-16 DIAGNOSIS — Z95828 Presence of other vascular implants and grafts: Secondary | ICD-10-CM

## 2023-05-16 DIAGNOSIS — D696 Thrombocytopenia, unspecified: Secondary | ICD-10-CM | POA: Diagnosis not present

## 2023-05-16 DIAGNOSIS — R634 Abnormal weight loss: Secondary | ICD-10-CM | POA: Diagnosis not present

## 2023-05-16 DIAGNOSIS — Z5111 Encounter for antineoplastic chemotherapy: Secondary | ICD-10-CM | POA: Diagnosis not present

## 2023-05-16 DIAGNOSIS — D539 Nutritional anemia, unspecified: Secondary | ICD-10-CM | POA: Diagnosis not present

## 2023-05-16 DIAGNOSIS — E538 Deficiency of other specified B group vitamins: Secondary | ICD-10-CM | POA: Diagnosis not present

## 2023-05-16 DIAGNOSIS — K5900 Constipation, unspecified: Secondary | ICD-10-CM | POA: Diagnosis not present

## 2023-05-16 LAB — CBC WITH DIFFERENTIAL/PLATELET
Abs Immature Granulocytes: 0.4 10*3/uL — ABNORMAL HIGH (ref 0.00–0.07)
Band Neutrophils: 1 %
Basophils Absolute: 0 10*3/uL (ref 0.0–0.1)
Basophils Relative: 0 %
Eosinophils Absolute: 0 10*3/uL (ref 0.0–0.5)
Eosinophils Relative: 0 %
HCT: 27.9 % — ABNORMAL LOW (ref 36.0–46.0)
Hemoglobin: 8.5 g/dL — ABNORMAL LOW (ref 12.0–15.0)
Lymphocytes Relative: 23 %
Lymphs Abs: 1 10*3/uL (ref 0.7–4.0)
MCH: 30.6 pg (ref 26.0–34.0)
MCHC: 30.5 g/dL (ref 30.0–36.0)
MCV: 100.4 fL — ABNORMAL HIGH (ref 80.0–100.0)
Metamyelocytes Relative: 7 %
Monocytes Absolute: 0.4 10*3/uL (ref 0.1–1.0)
Monocytes Relative: 9 %
Myelocytes: 2 %
Neutro Abs: 2.5 10*3/uL (ref 1.7–7.7)
Neutrophils Relative %: 58 %
Platelets: 165 10*3/uL (ref 150–400)
RBC: 2.78 MIL/uL — ABNORMAL LOW (ref 3.87–5.11)
RDW: 21.3 % — ABNORMAL HIGH (ref 11.5–15.5)
WBC: 4.2 10*3/uL (ref 4.0–10.5)
nRBC: 2.6 % — ABNORMAL HIGH (ref 0.0–0.2)
nRBC: 4 /100{WBCs} — ABNORMAL HIGH

## 2023-05-16 LAB — COMPREHENSIVE METABOLIC PANEL
ALT: 14 U/L (ref 0–44)
AST: 35 U/L (ref 15–41)
Albumin: 4.1 g/dL (ref 3.5–5.0)
Alkaline Phosphatase: 59 U/L (ref 38–126)
Anion gap: 9 (ref 5–15)
BUN: 10 mg/dL (ref 8–23)
CO2: 25 mmol/L (ref 22–32)
Calcium: 9.3 mg/dL (ref 8.9–10.3)
Chloride: 104 mmol/L (ref 98–111)
Creatinine, Ser: 0.74 mg/dL (ref 0.44–1.00)
GFR, Estimated: >60 mL/min (ref >60)
Glucose, Bld: 95 mg/dL (ref 70–99)
Potassium: 4 mmol/L (ref 3.5–5.1)
Sodium: 138 mmol/L (ref 135–145)
Total Bilirubin: 1.2 mg/dL (ref 0.3–1.2)
Total Protein: 7 g/dL (ref 6.5–8.1)

## 2023-05-16 LAB — MAGNESIUM: Magnesium: 2 mg/dL (ref 1.7–2.4)

## 2023-05-16 MED ORDER — SODIUM CHLORIDE 0.9 % IV SOLN
75.0000 mg/m2 | Freq: Once | INTRAVENOUS | Status: AC
  Administered 2023-05-16: 126 mg via INTRAVENOUS
  Filled 2023-05-16: qty 12.6

## 2023-05-16 MED ORDER — SODIUM CHLORIDE 0.9% FLUSH
10.0000 mL | Freq: Once | INTRAVENOUS | Status: AC
  Administered 2023-05-16: 10 mL via INTRAVENOUS

## 2023-05-16 MED ORDER — PALONOSETRON HCL INJECTION 0.25 MG/5ML
0.2500 mg | Freq: Once | INTRAVENOUS | Status: AC
  Administered 2023-05-16: 0.25 mg via INTRAVENOUS
  Filled 2023-05-16: qty 5

## 2023-05-16 MED ORDER — HEPARIN SOD (PORK) LOCK FLUSH 100 UNIT/ML IV SOLN
500.0000 [IU] | Freq: Once | INTRAVENOUS | Status: AC
  Administered 2023-05-16: 500 [IU] via INTRAVENOUS

## 2023-05-16 MED ORDER — SODIUM CHLORIDE 0.9 % IV SOLN: Freq: Once | INTRAVENOUS | Status: AC

## 2023-05-16 MED ORDER — SODIUM CHLORIDE 0.9% FLUSH
10.0000 mL | INTRAVENOUS | Status: DC | PRN
  Administered 2023-05-16: 10 mL via INTRAVENOUS

## 2023-05-16 NOTE — Progress Notes (Signed)

## 2023-05-16 NOTE — Progress Notes (Signed)
[facility] [address] [place] [address]    Clinic Day:  05/16/2023  Referring physician: Carylon Perches, MD  Patient Care Team: Carylon Perches, MD as PCP - General (Internal Medicine) Jena Gauss Gerrit Friends, MD as Attending Physician (Gastroenterology) Storm Frisk, MD as Consulting Physician (Pulmonary Disease) Doreatha Massed, MD as Medical Oncologist (Hematology)   ASSESSMENT & PLAN:   Assessment: 1.  Macrocytic anemia and thrombocytopenia: - Patient seen at the request of Dr. Ouida Sills for abnormal CBC. - 04/17/2022: WBC 15.8, Hb-9.7, PLT-128 - 03/24/2022: WBC-8.1 (N-43%, L-17%, M-19%, B-2%), Hb-9.6, MCV-99, PLT-115 - Smear review: 13% metamyelocytes, 2% myelocytes, 2% blasts,  - 10/19/2021: WBC-5.3 (N-61, L-21, M-10%), Hb-11, MCV-96, PLT-97 - BMBX (05/25/2022): Hypercellular bone marrow with myeloid hyperplasia with dysgranulopoiesis, erythroid hypoplasia and megakaryocytic hyperplasia with dyspoiesis.  Chromosome analysis was 3, XX.  Bone marrow blasts less than 5%.  Peripheral blood blasts less than 2%. - Mayo molecular model risk stratification: Intermediate 2 risk with at least 2 points (decreased hemoglobin less than 10, circulating immature cells).  Intermediate 2 risk with median OS 31 months. - Serum EPO level 41. - NGS: Positive for CBL, MPL, SRSF2, TET 2, RAD21 - PET scan (07/08/2022): Splenomegaly (volume 510 mm) with normal metabolic activity.  Uniform increase in marrow metabolic activity related to patient's anemia.  No lymphadenopathy. - BMBX (07/16/2022): Hypercellular bone marrow (95-100%) with myeloid hyperplasia, atypical monocytes, increased blasts (16% overall).  Atypical monocytosis present 23% of total events, expressing HLA-DR, CD38, CD4 dim, CD11C, CD13, CD14, CD36, CD64 with aberrant coexpression of CD56 and CD7. - Cycle 1 of azacitidine on 08/02/2022, cycle 2 on 08/30/2022.  Azacitidine decreased to 5 days starting cycle 5 on 11/22/2022 - BMBX  (09/21/2022): 3% blasts, hypercellular marrow with markedly increased dyspoietic megakaryocytes, increased fibrosis - BMBX (11/16/2022): Hypercellular marrow (70-80%) with marked GIST [name] (, myelofibrosis and 3% blasts on a very limited sample.   2.  Social/family history: - She lives at home by herself.  Son lives next door. - Does part-time work at [employer].  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  Non-smoker. - Maternal aunt had tumor in the breast, patient not certain if it is cancer.  2 maternal first cousins had lymphoma.  Mother had stomach cancer.    Plan: 1.  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 - She had bone marrow biopsy done on 05/06/2023: Hypercellular marrow with 85% cellularity with fibrosis and 4% blasts approximately. - I have discussed with Dr. Lowell Guitar over the bone marrow biopsy.  Blast count has slightly increased from previous value.  Hence it was recommended that we go back to azacitidine x 5 days every 4 weeks. - Reviewed labs today: Normal LFTs and creatinine.  CBC with normal white count and platelet count.  Hemoglobin is 8.5.  ANC is normal at 2.5. - She will proceed with azacitidine 75 mg/m x 5 days.  As our clinic is closed during the week of Thanksgiving, we will schedule her next treatment on 06/20/2023.  She will have a repeat bone marrow biopsy done on 07/08/2023.   2.  Folate deficiency: - Continue folic acid tablet daily.   3.  Weight loss: - She is continuing to eat well and not requiring Megace.   4.  Constipation: - Continue Colace daily and lactulose as needed.  This is well-controlled.    No orders  of the defined types were placed in this encounter.     I,Katie Daubenspeck,acting as a Neurosurgeon for Doreatha Massed, MD.,have documented all relevant documentation on the behalf of Doreatha Massed, MD,as directed by  Doreatha Massed, MD  while in the presence of Doreatha Massed, MD.   I, Doreatha Massed MD, have reviewed the above documentation for accuracy and completeness, and I agree with the above.   Doreatha Massed, MD   10/28/20241:11 PM  CHIEF COMPLAINT:   Diagnosis: CMML-2    Cancer Staging  No matching staging information was found for the patient.    Prior Therapy: none  Current Therapy:  Azacitidine 70 mg/m x 7 days every 28 days    HISTORY OF PRESENT ILLNESS:   Oncology History  CMML (chronic myelomonocytic leukemia) (HCC)  06/03/2022 Initial Diagnosis   CMML (chronic myelomonocytic leukemia) (HCC)   08/02/2022 -  Chemotherapy   Patient is on Treatment Plan : MYELODYSPLASIA  Azacitidine IVPB D1-7 q28d        INTERVAL HISTORY:   [name] is a 72 y.o. female presenting to clinic today for follow up of CMML-2. She was last seen by me on 04/18/23.  Since her last visit, she was seen at [facility] and underwent repeat bone marrow biopsy on 05/06/23. BMB showed: hypercellular marrow (85%) with fibrosis and persistent involvement by patient's known myeloid neoplasm (<5% blasts). The recommendation is to continue azacitidine.  Today, she states that she is doing well overall. Her appetite level is at 70%. Her energy level is at 70%.  PAST MEDICAL HISTORY:   Past Medical History: Past Medical History:  Diagnosis Date   Anxiety    Asthma    had 1 episode 1 year ago-no more problem   CHF (congestive heart failure) (HCC)    swelling of feet & ankles   CMML (chronic myelomonocytic leukemia) (HCC) 06/03/2022   Cough    Depression    OCD   GERD (gastroesophageal reflux disease)    severe   OCD (obsessive compulsive disorder)    Port-A-Cath in place 07/26/2022   Shortness of breath    with exertion   Yeast infection 04/09/2014    Surgical History: Past Surgical History:  Procedure Laterality Date   CARPAL TUNNEL RELEASE  03/22/2012   Procedure: CARPAL TUNNEL RELEASE;  Surgeon: Nicki Reaper, MD;  Location: [facility];  Service: Orthopedics;  Laterality: Right;  right carpal tunnel release   COLONOSCOPY  06/19/2012   Dr. Rourk:normal rectum and colon    ESOPHAGOGASTRODUODENOSCOPY N/A 12/26/2015   Dr. Jena Gauss: normal    IR IMAGING GUIDED PORT INSERTION  07/30/2022   TONSILLECTOMY     TUBAL LIGATION      Social History: Social History   Socioeconomic History   Marital status: Widowed    Spouse name: Not on file   Number of children: 1   Years of education: Not on file   Highest education level: Not on file  Occupational History   Occupation: retired  Tobacco Use   Smoking status: Never    Passive exposure: Yes   Smokeless tobacco: Never  Vaping Use   Vaping status: Never Used  Substance and Sexual Activity   Alcohol use: No   Drug use: No   Sexual activity: Yes    Birth control/protection: Surgical    Comment: tubal  Other Topics Concern   Not on file  Social History Narrative   Not on file   Social Determinants of Health  Financial Resource Strain: Not on file  Food Insecurity: Not on file  Transportation Needs: Not on file  Physical Activity: Not on file  Stress: Not on file  Social Connections: Not on file  Intimate Partner Violence: Not on file    Family History: Family History  Problem Relation Age of Onset   Asthma Mother    Macular degeneration Mother    Hypertension Mother    Stomach cancer Mother 22   Alzheimer's disease Father    Other Sister        blood clots   Diabetes Maternal Grandfather    Diabetes Paternal Grandfather    Other Son        enlarged spleen   Lymphoma Cousin        x2, both dx in their 74s   Colon cancer Neg Hx     Current Medications:  Current Outpatient Medications:    albuterol (VENTOLIN HFA) 108 (90 Base) MCG/ACT inhaler, INHALE 2 PUFFS BY MOUTH EVERY 6 HOURS AS NEEDED FOR SHORTNESS OF BREATH OR WHEEZING., Disp: 18 g, Rfl: 2   albuterol (VENTOLIN HFA) 108 (90 Base) MCG/ACT inhaler,  Inhale 2 puffs into the lungs every 4 (four) hours as needed for wheezing or shortness of breath., Disp: 18 g, Rfl: 1   ARNUITY ELLIPTA 200 MCG/ACT AEPB, Inhale 1 puff into the lungs daily., Disp: 30 each, Rfl: 5   azaCITIDine 5 mg/2 mLs in lactated ringers infusion, Inject into the vein daily. Days 1-7 every 28 days, Disp: , Rfl:    azelastine (ASTELIN) 0.1 % nasal spray, SPRAY 1 TO 2 SPRAYS PER NOSTRIL TWICE DAILY., Disp: 30 mL, Rfl: 3   benzonatate (TESSALON) 200 MG capsule, Take 200 mg by mouth 3 (three) times daily as needed., Disp: , Rfl:    BREZTRI AEROSPHERE 160-9-4.8 MCG/ACT AERO, Inhale 2 puffs into the lungs 2 (two) times daily., Disp: 10.7 g, Rfl: 5   cetirizine (ZYRTEC) 5 MG tablet, Take 1 tablet (5 mg total) by mouth daily., Disp: 30 tablet, Rfl: 0   ciclopirox (PENLAC) 8 % solution, APPLY TOPICALLY AT BEDTIME. APPLY OVER NAIL FOLD AND SURROUNDING SKIN. APPLY DAILY OVER PREVIOUS COAT. AFTER SEVEN DAYS, MAY REMOVE WITH ALCOHOL AND CONTINUE CYCLE., Disp: 6.6 mL, Rfl: 2   EPINEPHrine 0.3 mg/0.3 mL IJ SOAJ injection, Inject 0.3 mg into the muscle as needed for anaphylaxis., Disp: 2 each, Rfl: 1   famotidine (PEPCID) 20 MG tablet, Take 1 tablet (20 mg total) by mouth 2 (two) times daily., Disp: 60 tablet, Rfl: 5   FLUoxetine (PROZAC) 20 MG capsule, Take 1 capsule by mouth daily., Disp: , Rfl:    fluticasone (FLONASE) 50 MCG/ACT nasal spray, Place 2 sprays into both nostrils 2 (two) times daily., Disp: 16 g, Rfl: 5   fluticasone (FLOVENT HFA) 110 MCG/ACT inhaler, Inhale two puffs twice daily to prevent cough or wheeze. Rinse mouth after use., Disp: 12 g, Rfl: 5   folic acid (FOLVITE) 1 MG tablet, TAKE ONE TABLET BY MOUTH ONCE DAILY, Disp: 30 tablet, Rfl: 5   gabapentin (NEURONTIN) 300 MG capsule, , Disp: , Rfl:    ipratropium (ATROVENT) 0.06 % nasal spray, USE 2 SPRAYS IN EACH NOSTRIL THREE TIMES A DAY AS NEEDED., Disp: 15 mL, Rfl: 5   lactulose (CHRONULAC) 10 GM/15ML solution, take 30ml BY  MOUTH EVERY THREE HOURS UNTIL bowel movement; THEN take 30ml ONCE DAILY AS NEEDED FOR CONSTIPATION, Disp: 450 mL, Rfl: 2   megestrol (MEGACE) 400 MG/10ML suspension, Take  10 mLs (400 mg total) by mouth 2 (two) times daily., Disp: 480 mL, Rfl: 2   montelukast (SINGULAIR) 10 MG tablet, Take 1 tablet (10 mg total) by mouth at bedtime., Disp: 30 tablet, Rfl: 5   NON FORMULARY, [facility]  Antifungal (nail)-#1, Disp: , Rfl:    pantoprazole (PROTONIX) 40 MG tablet, Take 1 tablet (40 mg total) by mouth daily., Disp: 30 tablet, Rfl: 5   Respiratory Therapy Supplies (FLUTTER) DEVI, Use as directed, Disp: 1 each, Rfl: 3   SF 5000 PLUS 1.1 % CREA dental cream, , Disp: , Rfl:    Spacer/Aero-Holding Chambers (AEROCHAMBER PLUS WITH MASK) inhaler, 1 each by Other route See admin instructions. Use with inhaler as instructed., Disp: 1 each, Rfl: 1   Tezepelumab-ekko (TEZSPIRE) 210 MG/1. SOAJ, Inject 210 mg into the skin every 28 (twenty-eight) days., Disp: 1.91 mL, Rfl: 11   tiZANidine (ZANAFLEX) 2 MG tablet, Take 1 tablet (2 mg total) by mouth 2 (two) times daily as needed for muscle spasms., Disp: 15 tablet, Rfl: 0   traZODone (DESYREL) 50 MG tablet, TAKE ONE TABLET BY MOUTH AT BEDTIME, Disp: 30 tablet, Rfl: 3   lidocaine-prilocaine (EMLA) cream, Apply a quarter sized amount to port a cath site and cover with plastic wrap one hour prior to infusion appointments (Patient not taking: Reported on 05/16/2023), Disp: 30 g, Rfl: 3   prochlorperazine (COMPAZINE) 10 MG tablet, Take 1 tablet (10 mg total) by mouth every 6 (six) hours as needed for nausea or vomiting. (Patient not taking: Reported on 05/16/2023), Disp: 30 tablet, Rfl: 0  Current Facility-Administered Medications:    tezepelumab-ekko (TEZSPIRE) 210 MG/1. syringe 210 mg, 210 mg, Subcutaneous, Q28 days, Marcelyn Bruins, MD, 210 mg at 05/11/23 1003   Allergies: Allergies  Allergen Reactions   Levonorgestrel-Ethinyl Estrad Cough    Sulfa Antibiotics Other (See Comments)    Unknown- pt unsure of reaction; believes it may be nausea   Sulfamethoxazole-Trimethoprim Other (See Comments)   Cephalosporins Other (See Comments)    Other Reaction: Toxicity    REVIEW OF SYSTEMS:   Review of Systems  Constitutional:  Negative for chills, fatigue and fever.  HENT:   Negative for lump/mass, mouth sores, nosebleeds, sore throat and trouble swallowing.   Eyes:  Negative for eye problems.  Respiratory:  Positive for cough and shortness of breath.   Cardiovascular:  Negative for chest pain, leg swelling and palpitations.  Gastrointestinal:  Positive for constipation. Negative for abdominal pain, diarrhea, nausea and vomiting.  Genitourinary:  Negative for bladder incontinence, difficulty urinating, dysuria, frequency, hematuria and nocturia.   Musculoskeletal:  Negative for arthralgias, back pain, flank pain, myalgias and neck pain.  Skin:  Negative for itching and rash.  Neurological:  Negative for dizziness, headaches and numbness.  Hematological:  Does not bruise/bleed easily.  Psychiatric/Behavioral:  Negative for depression, sleep disturbance and suicidal ideas. The patient is not nervous/anxious.   All other systems reviewed and are negative.    VITALS:   There were no vitals taken for this visit.  Wt Readings from Last 3 Encounters:  05/16/23 133 lb 9.6 oz (60.6 kg)  04/18/23 130 lb 4.8 oz (59.1 kg)  03/07/23 133 lb 9.6 oz (60.6 kg)    There is no height or weight on file to calculate BMI.  Performance status (ECOG): 1 - Symptomatic but completely ambulatory  PHYSICAL EXAM:   Physical Exam Vitals and nursing note reviewed. Exam conducted with a chaperone present.  Constitutional:  Appearance: Normal appearance.  Cardiovascular:     Rate and Rhythm: Normal rate and regular rhythm.     Pulses: Normal pulses.     Heart sounds: Normal heart sounds.  Pulmonary:     Effort: Pulmonary effort is normal.      Breath sounds: Normal breath sounds.  Abdominal:     Palpations: Abdomen is soft. There is no hepatomegaly, splenomegaly or mass.     Tenderness: There is no abdominal tenderness.  Musculoskeletal:     Right lower leg: No edema.     Left lower leg: No edema.  Lymphadenopathy:     Cervical: No cervical adenopathy.     Right cervical: No superficial, deep or posterior cervical adenopathy.    Left cervical: No superficial, deep or posterior cervical adenopathy.     Upper Body:     Right upper body: No supraclavicular or axillary adenopathy.     Left upper body: No supraclavicular or axillary adenopathy.  Neurological:     General: No focal deficit present.     Mental Status: She is alert and oriented to person, place, and time.  Psychiatric:        Mood and Affect: Mood normal.        Behavior: Behavior normal.     LABS:      Latest Ref Rng & Units 05/16/2023    9:08 AM 04/18/2023   10:31 AM 03/07/2023   11:11 AM  CBC  WBC 4.0 - 10.5 K/uL 4.2  3.8  3.8   Hemoglobin 12.0 - 15.0 g/dL 8.5  9.1  8.8   Hematocrit 36.0 - 46.0 % 27.9  29.0  28.7   Platelets 150 - 400 K/uL 165  117  117       Latest Ref Rng & Units 05/16/2023    9:08 AM 04/18/2023   10:31 AM 03/07/2023   11:25 AM  CMP  Glucose 70 - 99 mg/dL 95  161  92   BUN 8 - 23 mg/dL 10  16  14    Creatinine 0.44 - 1.00 mg/dL 0.96  0.45  4.09   Sodium 135 - 145 mmol/L 138  137  135   Potassium 3.5 - 5.1 mmol/L 4.0  4.0  4.2   Chloride 98 - 111 mmol/L 104  104  103   CO2 22 - 32 mmol/L 25  25  25    Calcium 8.9 - 10.3 mg/dL 9.3  9.0  9.0   Total Protein 6.5 - 8.1 g/dL 7.0  7.1  7.4   Total Bilirubin 0.3 - 1.2 mg/dL 1.2  1.0  1.7   Alkaline Phos 38 - 126 U/L 59  76  81   AST 15 - 41 U/L 35  24  29   ALT 0 - 44 U/L 14  12  14       No results found for: "CEA1", "CEA" / No results found for: "CEA1", "CEA" No results found for: "PSA1" No results found for: "CAN199" No results found for: "CAN125"  Lab Results  Component  Value Date   TOTALPROTELP 6.3 04/26/2022   ALBUMINELP 3.8 04/26/2022   A1GS 0.3 04/26/2022   A2GS 0.5 04/26/2022   BETS 0.8 04/26/2022   GAMS 0.9 04/26/2022   MSPIKE Not Observed 04/26/2022   SPEI Comment 04/26/2022   Lab Results  Component Value Date   TIBC 343 04/26/2022   FERRITIN 114 04/26/2022   IRONPCTSAT 25 04/26/2022   Lab Results  Component Value Date   LDH  650 (H) 04/18/2023   LDH 704 (H) 03/07/2023   LDH 900 (H) 01/24/2023     STUDIES:   No results found.

## 2023-05-16 NOTE — Patient Instructions (Signed)
[facility] [place]  Discharge Instructions: Thank you for choosing [facility] to provide your oncology and hematology care.  If you have a lab appointment with the Cancer Center - please note that after April 8th, 2024, all labs will be drawn in the cancer center.  You do not have to check in or register with the main entrance as you have in the past but will complete your check-in in the cancer center.  Wear comfortable clothing and clothing appropriate for easy access to any Portacath or PICC line.   We strive to give you quality time with your provider. You may need to reschedule your appointment if you arrive late (15 or more minutes).  Arriving late affects you and other patients whose appointments are after yours.  Also, if you miss three or more appointments without notifying the office, you may be dismissed from the clinic at the provider's discretion.      For prescription refill requests, have your pharmacy contact our office and allow 72 hours for refills to be completed.    Today you received the following chemotherapy and/or immunotherapy agents vidaza   To help prevent nausea and vomiting after your treatment, we encourage you to take your nausea medication as directed.  BELOW ARE SYMPTOMS THAT SHOULD BE REPORTED IMMEDIATELY: *FEVER GREATER THAN 100.4 F (38 C) OR HIGHER *CHILLS OR SWEATING *NAUSEA AND VOMITING THAT IS NOT CONTROLLED WITH YOUR NAUSEA MEDICATION *UNUSUAL SHORTNESS OF BREATH *UNUSUAL BRUISING OR BLEEDING *URINARY PROBLEMS (pain or burning when urinating, or frequent urination) *BOWEL PROBLEMS (unusual diarrhea, constipation, pain near the anus) TENDERNESS IN MOUTH AND THROAT WITH OR WITHOUT PRESENCE OF ULCERS (sore throat, sores in mouth, or a toothache) UNUSUAL RASH, SWELLING OR PAIN  UNUSUAL VAGINAL DISCHARGE OR ITCHING   Items with * indicate a potential emergency and should be followed up as soon as possible or go to the  Emergency Department if any problems should occur.  Please show the CHEMOTHERAPY ALERT CARD or IMMUNOTHERAPY ALERT CARD at check-in to the Emergency Department and triage nurse.  Should you have questions after your visit or need to cancel or reschedule your appointment, please contact [facility] [place] [phone]  and follow the prompts.  Office hours are 8:00 a.m. to 4:30 p.m. Monday - Friday. Please note that voicemails left after 4:00 p.m. may not be returned until the following business day.  We are closed weekends and major holidays. You have access to a nurse at all times for urgent questions. Please call the main number to the clinic [phone] and follow the prompts.  For any non-urgent questions, you may also contact your provider using MyChart. We now offer e-Visits for anyone 18 and older to request care online for non-urgent symptoms. For details visit [email or web address].   Also download the MyChart app! Go to the app store, search "MyChart", open the app, select , and log in with your MyChart username and password.   

## 2023-05-16 NOTE — Patient Instructions (Signed)

## 2023-05-16 NOTE — Progress Notes (Signed)
Patient has been examined by Dr. Katragadda. Vital signs and labs have been reviewed by MD - ANC, Creatinine, LFTs, hemoglobin, and platelets are within treatment parameters per M.D. - pt may proceed with treatment.  Primary RN and pharmacy notified.  

## 2023-05-16 NOTE — Progress Notes (Signed)
Patients port flushed without difficulty.  Good blood return noted with no bruising or swelling noted at site.  Patient remains accessed for treatment.  

## 2023-05-17 ENCOUNTER — Inpatient Hospital Stay: Payer: Medicare HMO

## 2023-05-17 VITALS — BP 91/59 | HR 73 | Temp 98.9°F | Resp 18 | Wt 135.4 lb

## 2023-05-17 DIAGNOSIS — D696 Thrombocytopenia, unspecified: Secondary | ICD-10-CM | POA: Diagnosis not present

## 2023-05-17 DIAGNOSIS — D539 Nutritional anemia, unspecified: Secondary | ICD-10-CM | POA: Diagnosis not present

## 2023-05-17 DIAGNOSIS — R161 Splenomegaly, not elsewhere classified: Secondary | ICD-10-CM | POA: Diagnosis not present

## 2023-05-17 DIAGNOSIS — Z95828 Presence of other vascular implants and grafts: Secondary | ICD-10-CM

## 2023-05-17 DIAGNOSIS — K219 Gastro-esophageal reflux disease without esophagitis: Secondary | ICD-10-CM | POA: Diagnosis not present

## 2023-05-17 DIAGNOSIS — Z5111 Encounter for antineoplastic chemotherapy: Secondary | ICD-10-CM | POA: Diagnosis not present

## 2023-05-17 DIAGNOSIS — E538 Deficiency of other specified B group vitamins: Secondary | ICD-10-CM | POA: Diagnosis not present

## 2023-05-17 DIAGNOSIS — R634 Abnormal weight loss: Secondary | ICD-10-CM | POA: Diagnosis not present

## 2023-05-17 DIAGNOSIS — C9310 Chronic myelomonocytic leukemia not having achieved remission: Secondary | ICD-10-CM | POA: Diagnosis not present

## 2023-05-17 DIAGNOSIS — K5900 Constipation, unspecified: Secondary | ICD-10-CM | POA: Diagnosis not present

## 2023-05-17 MED ORDER — HEPARIN SOD (PORK) LOCK FLUSH 100 UNIT/ML IV SOLN
500.0000 [IU] | Freq: Once | INTRAVENOUS | Status: AC
  Administered 2023-05-17: 500 [IU] via INTRAVENOUS

## 2023-05-17 MED ORDER — SODIUM CHLORIDE 0.9% FLUSH
10.0000 mL | Freq: Once | INTRAVENOUS | Status: AC
  Administered 2023-05-17: 10 mL via INTRAVENOUS

## 2023-05-17 MED ORDER — SODIUM CHLORIDE 0.9 % IV SOLN
75.0000 mg/m2 | Freq: Once | INTRAVENOUS | Status: AC
  Administered 2023-05-17: 126 mg via INTRAVENOUS
  Filled 2023-05-17: qty 12.6

## 2023-05-17 MED ORDER — SODIUM CHLORIDE 0.9 % IV SOLN: Freq: Once | INTRAVENOUS | Status: AC

## 2023-05-17 NOTE — Patient Instructions (Signed)
[facility] [place]  Discharge Instructions: Thank you for choosing [facility] to provide your oncology and hematology care.  If you have a lab appointment with the Cancer Center - please note that after April 8th, 2024, all labs will be drawn in the cancer center.  You do not have to check in or register with the main entrance as you have in the past but will complete your check-in in the cancer center.  Wear comfortable clothing and clothing appropriate for easy access to any Portacath or PICC line.   We strive to give you quality time with your provider. You may need to reschedule your appointment if you arrive late (15 or more minutes).  Arriving late affects you and other patients whose appointments are after yours.  Also, if you miss three or more appointments without notifying the office, you may be dismissed from the clinic at the provider's discretion.      For prescription refill requests, have your pharmacy contact our office and allow 72 hours for refills to be completed.    Today you received the following chemotherapy and/or immunotherapy agents Vidaza   To help prevent nausea and vomiting after your treatment, we encourage you to take your nausea medication as directed.  BELOW ARE SYMPTOMS THAT SHOULD BE REPORTED IMMEDIATELY: *FEVER GREATER THAN 100.4 F (38 C) OR HIGHER *CHILLS OR SWEATING *NAUSEA AND VOMITING THAT IS NOT CONTROLLED WITH YOUR NAUSEA MEDICATION *UNUSUAL SHORTNESS OF BREATH *UNUSUAL BRUISING OR BLEEDING *URINARY PROBLEMS (pain or burning when urinating, or frequent urination) *BOWEL PROBLEMS (unusual diarrhea, constipation, pain near the anus) TENDERNESS IN MOUTH AND THROAT WITH OR WITHOUT PRESENCE OF ULCERS (sore throat, sores in mouth, or a toothache) UNUSUAL RASH, SWELLING OR PAIN  UNUSUAL VAGINAL DISCHARGE OR ITCHING   Items with * indicate a potential emergency and should be followed up as soon as possible or go to the  Emergency Department if any problems should occur.  Please show the CHEMOTHERAPY ALERT CARD or IMMUNOTHERAPY ALERT CARD at check-in to the Emergency Department and triage nurse.  Should you have questions after your visit or need to cancel or reschedule your appointment, please contact [facility] [place] [phone]  and follow the prompts.  Office hours are 8:00 a.m. to 4:30 p.m. Monday - Friday. Please note that voicemails left after 4:00 p.m. may not be returned until the following business day.  We are closed weekends and major holidays. You have access to a nurse at all times for urgent questions. Please call the main number to the clinic [phone] and follow the prompts.  For any non-urgent questions, you may also contact your provider using MyChart. We now offer e-Visits for anyone 18 and older to request care online for non-urgent symptoms. For details visit [email or web address].   Also download the MyChart app! Go to the app store, search "MyChart", open the app, select [facility], and log in with your MyChart username and password.   

## 2023-05-17 NOTE — Progress Notes (Signed)
Patient presents today for Vidaza infusion per providers order.  Vital signs within parameters for treatment.  Patient has no new complaints at this time.  Stable during infusion without adverse affects.  Vital signs stable.  No complaints at this time.  Discharge from clinic ambulatory in stable condition.  Alert and oriented X 3.  Follow up with [facility] as scheduled.

## 2023-05-18 ENCOUNTER — Inpatient Hospital Stay: Payer: Medicare HMO

## 2023-05-18 VITALS — BP 99/63 | HR 78 | Temp 98.8°F | Resp 18

## 2023-05-18 DIAGNOSIS — K219 Gastro-esophageal reflux disease without esophagitis: Secondary | ICD-10-CM | POA: Diagnosis not present

## 2023-05-18 DIAGNOSIS — K5900 Constipation, unspecified: Secondary | ICD-10-CM | POA: Diagnosis not present

## 2023-05-18 DIAGNOSIS — R161 Splenomegaly, not elsewhere classified: Secondary | ICD-10-CM | POA: Diagnosis not present

## 2023-05-18 DIAGNOSIS — E538 Deficiency of other specified B group vitamins: Secondary | ICD-10-CM | POA: Diagnosis not present

## 2023-05-18 DIAGNOSIS — C9310 Chronic myelomonocytic leukemia not having achieved remission: Secondary | ICD-10-CM | POA: Diagnosis not present

## 2023-05-18 DIAGNOSIS — D539 Nutritional anemia, unspecified: Secondary | ICD-10-CM | POA: Diagnosis not present

## 2023-05-18 DIAGNOSIS — R634 Abnormal weight loss: Secondary | ICD-10-CM | POA: Diagnosis not present

## 2023-05-18 DIAGNOSIS — Z5111 Encounter for antineoplastic chemotherapy: Secondary | ICD-10-CM | POA: Diagnosis not present

## 2023-05-18 DIAGNOSIS — Z95828 Presence of other vascular implants and grafts: Secondary | ICD-10-CM

## 2023-05-18 DIAGNOSIS — D696 Thrombocytopenia, unspecified: Secondary | ICD-10-CM | POA: Diagnosis not present

## 2023-05-18 MED ORDER — SODIUM CHLORIDE 0.9 % IV SOLN: Freq: Once | INTRAVENOUS | Status: AC

## 2023-05-18 MED ORDER — PALONOSETRON HCL INJECTION 0.25 MG/5ML
0.2500 mg | Freq: Once | INTRAVENOUS | Status: AC
  Administered 2023-05-18: 0.25 mg via INTRAVENOUS
  Filled 2023-05-18: qty 5

## 2023-05-18 MED ORDER — SODIUM CHLORIDE 0.9% FLUSH
10.0000 mL | Freq: Once | INTRAVENOUS | Status: AC
  Administered 2023-05-18: 10 mL via INTRAVENOUS

## 2023-05-18 MED ORDER — SODIUM CHLORIDE 0.9 % IV SOLN
75.0000 mg/m2 | Freq: Once | INTRAVENOUS | Status: AC
  Administered 2023-05-18: 126 mg via INTRAVENOUS
  Filled 2023-05-18: qty 12.6

## 2023-05-18 MED ORDER — HEPARIN SOD (PORK) LOCK FLUSH 100 UNIT/ML IV SOLN
500.0000 [IU] | Freq: Once | INTRAVENOUS | Status: AC
  Administered 2023-05-18: 500 [IU] via INTRAVENOUS

## 2023-05-18 NOTE — Patient Instructions (Signed)
[facility]  Discharge Instructions: Thank you for choosing [facility] to provide your oncology and hematology care.  If you have a lab appointment with the Cancer Center - please note that after April 8th, 2024, all labs will be drawn in the cancer center.  You do not have to check in or register with the main entrance as you have in the past but will complete your check-in in the cancer center.  Wear comfortable clothing and clothing appropriate for easy access to any Portacath or PICC line.   We strive to give you quality time with your provider. You may need to reschedule your appointment if you arrive late (15 or more minutes).  Arriving late affects you and other patients whose appointments are after yours.  Also, if you miss three or more appointments without notifying the office, you may be dismissed from the clinic at the provider's discretion.      For prescription refill requests, have your pharmacy contact our office and allow 72 hours for refills to be completed.    Today you received the following chemotherapy and/or immunotherapy agents Vidaza, return as scheduled.   To help prevent nausea and vomiting after your treatment, we encourage you to take your nausea medication as directed.  BELOW ARE SYMPTOMS THAT SHOULD BE REPORTED IMMEDIATELY: *FEVER GREATER THAN 100.4 F (38 C) OR HIGHER *CHILLS OR SWEATING *NAUSEA AND VOMITING THAT IS NOT CONTROLLED WITH YOUR NAUSEA MEDICATION *UNUSUAL SHORTNESS OF BREATH *UNUSUAL BRUISING OR BLEEDING *URINARY PROBLEMS (pain or burning when urinating, or frequent urination) *BOWEL PROBLEMS (unusual diarrhea, constipation, pain near the anus) TENDERNESS IN MOUTH AND THROAT WITH OR WITHOUT PRESENCE OF ULCERS (sore throat, sores in mouth, or a toothache) UNUSUAL RASH, SWELLING OR PAIN  UNUSUAL VAGINAL DISCHARGE OR ITCHING   Items with * indicate a potential emergency and should be followed up as soon as  possible or go to the Emergency Department if any problems should occur.  Please show the CHEMOTHERAPY ALERT CARD or IMMUNOTHERAPY ALERT CARD at check-in to the Emergency Department and triage nurse.  Should you have questions after your visit or need to cancel or reschedule your appointment, please contact [facility] CENTER AT [facility] [phone]  and follow the prompts.  Office hours are 8:00 a.m. to 4:30 p.m. Monday - Friday. Please note that voicemails left after 4:00 p.m. may not be returned until the following business day.  We are closed weekends and major holidays. You have access to a nurse at all times for urgent questions. Please call the main number to the clinic [phone] and follow the prompts.  For any non-urgent questions, you may also contact your provider using MyChart. We now offer e-Visits for anyone 7 and older to request care online for non-urgent symptoms. For details visit [email or web address].   Also download the MyChart app! Go to the app store, search "MyChart", open the app, select [place], and log in with your MyChart username and password.

## 2023-05-18 NOTE — Progress Notes (Signed)
Patient tolerated chemotherapy with no complaints voiced. Side effects with management reviewed understanding verbalized. Port site clean and dry with no bruising or swelling noted at site. Good blood return noted before and after administration of chemotherapy.  Patient left in satisfactory condition with VSS and no s/s of distress noted. 

## 2023-05-19 ENCOUNTER — Inpatient Hospital Stay: Payer: Medicare HMO

## 2023-05-19 VITALS — BP 93/58 | HR 78 | Temp 98.0°F | Resp 18 | Wt 135.7 lb

## 2023-05-19 DIAGNOSIS — E538 Deficiency of other specified B group vitamins: Secondary | ICD-10-CM | POA: Diagnosis not present

## 2023-05-19 DIAGNOSIS — D539 Nutritional anemia, unspecified: Secondary | ICD-10-CM | POA: Diagnosis not present

## 2023-05-19 DIAGNOSIS — D696 Thrombocytopenia, unspecified: Secondary | ICD-10-CM | POA: Diagnosis not present

## 2023-05-19 DIAGNOSIS — C9310 Chronic myelomonocytic leukemia not having achieved remission: Secondary | ICD-10-CM

## 2023-05-19 DIAGNOSIS — K5900 Constipation, unspecified: Secondary | ICD-10-CM | POA: Diagnosis not present

## 2023-05-19 DIAGNOSIS — Z5111 Encounter for antineoplastic chemotherapy: Secondary | ICD-10-CM | POA: Diagnosis not present

## 2023-05-19 DIAGNOSIS — R634 Abnormal weight loss: Secondary | ICD-10-CM | POA: Diagnosis not present

## 2023-05-19 DIAGNOSIS — R161 Splenomegaly, not elsewhere classified: Secondary | ICD-10-CM | POA: Diagnosis not present

## 2023-05-19 DIAGNOSIS — K219 Gastro-esophageal reflux disease without esophagitis: Secondary | ICD-10-CM | POA: Diagnosis not present

## 2023-05-19 DIAGNOSIS — Z95828 Presence of other vascular implants and grafts: Secondary | ICD-10-CM

## 2023-05-19 MED ORDER — SODIUM CHLORIDE 0.9 % IV SOLN: Freq: Once | INTRAVENOUS | Status: AC

## 2023-05-19 MED ORDER — SODIUM CHLORIDE 0.9 % IV SOLN
75.0000 mg/m2 | Freq: Once | INTRAVENOUS | Status: AC
  Administered 2023-05-19: 126 mg via INTRAVENOUS
  Filled 2023-05-19: qty 12.6

## 2023-05-19 MED ORDER — SODIUM CHLORIDE 0.9% FLUSH
10.0000 mL | Freq: Once | INTRAVENOUS | Status: AC
  Administered 2023-05-19: 10 mL via INTRAVENOUS

## 2023-05-19 MED ORDER — HEPARIN SOD (PORK) LOCK FLUSH 100 UNIT/ML IV SOLN
500.0000 [IU] | Freq: Once | INTRAVENOUS | Status: AC
  Administered 2023-05-19: 500 [IU] via INTRAVENOUS

## 2023-05-19 NOTE — Progress Notes (Signed)
Vidaza D4 C10  given today per MD orders. Tolerated infusion without adverse affects. Vital signs stable. No complaints at this time. Discharged from clinic ambulatory in stable condition. Alert and oriented x 3. F/U with [facility] as scheduled.

## 2023-05-19 NOTE — Patient Instructions (Signed)
[facility] [place]  Discharge Instructions: Thank you for choosing [facility] to provide your oncology and hematology care.  If you have a lab appointment with the Cancer Center - please note that after April 8th, 2024, all labs will be drawn in the cancer center.  You do not have to check in or register with the main entrance as you have in the past but will complete your check-in in the cancer center.  Wear comfortable clothing and clothing appropriate for easy access to any Portacath or PICC line.   We strive to give you quality time with your provider. You may need to reschedule your appointment if you arrive late (15 or more minutes).  Arriving late affects you and other patients whose appointments are after yours.  Also, if you miss three or more appointments without notifying the office, you may be dismissed from the clinic at the provider's discretion.      For prescription refill requests, have your pharmacy contact our office and allow 72 hours for refills to be completed.    Today you received the following chemotherapy and/or immunotherapy agents Vidaza.  Azacitidine Injection What is this medication? AZACITIDINE (ay za SITE i deen) treats blood and bone marrow cancers. It works by slowing down the growth of cancer cells. This medicine may be used for other purposes; ask your health care provider or pharmacist if you have questions. COMMON BRAND NAME(S): Vidaza What should I tell my care team before I take this medication? They need to know if you have any of these conditions: Kidney disease Liver disease Low blood cell levels, such as low white cells, platelets, or red blood cells Low levels of albumin in the blood Low levels of bicarbonate in the blood An unusual or allergic reaction to azacitidine, mannitol, other medications, foods, dyes, or preservatives If you or your partner are pregnant or trying to get pregnant Breast-feeding How should I  use this medication? This medication is injected into a vein or under the skin. It is given by your care team in a hospital or clinic setting. Talk to your care team about the use of this medication in children. While it may be prescribed for children as young as 1 month for selected conditions, precautions do apply. Overdosage: If you think you have taken too much of this medicine contact a poison control center or emergency room at once. NOTE: This medicine is only for you. Do not share this medicine with others. What if I miss a dose? Keep appointments for follow-up doses. It is important not to miss your dose. Call your care team if you are unable to keep an appointment. What may interact with this medication? Interactions are not expected. This list may not describe all possible interactions. Give your health care provider a list of all the medicines, herbs, non-prescription drugs, or dietary supplements you use. Also tell them if you smoke, drink alcohol, or use illegal drugs. Some items may interact with your medicine. What should I watch for while using this medication? Your condition will be monitored carefully while you are receiving this medication. You may need blood work while taking this medication. This medication may make you feel generally unwell. This is not uncommon as chemotherapy can affect healthy cells as well as cancer cells. Report any side effects. Continue your course of treatment even though you feel ill unless your care team tells you to stop. Other product types may be available that contain the   medication azacitidine. The injection and oral products should not be used in place of one another. Talk to your care team if you have questions. This medication can cause serious side effects. To reduce the risk, your care team may give you other medications to take before receiving this one. Be sure to follow the directions from your care team. This medication may increase your  risk of getting an infection. Call your care team for advice if you get a fever, chills, sore throat, or other symptoms of a cold or flu. Do not treat yourself. Try to avoid being around people who are sick. Avoid taking medications that contain aspirin, acetaminophen, ibuprofen, naproxen, or ketoprofen unless instructed by your care team. These medications may hide a fever. This medication may increase your risk to bruise or bleed. Call your care team if you notice any unusual bleeding. Be careful brushing or flossing your teeth or using a toothpick because you may get an infection or bleed more easily. If you have any dental work done, tell your dentist you are receiving this medication. Talk to your care team if you or your partner may be pregnant. Serious birth defects can occur if you take this medication during pregnancy and for 6 months after the last dose. You will need a negative pregnancy test before starting this medication. Contraception is recommended while taking his medication and for 6 months after the last dose. Your care team can help you find the option that works for you. If your partner can get pregnant, use a condom during sex while taking this medication and for 3 months after the last dose. Do not breastfeed while taking this medication and for 1 week after the last dose. This medication may cause infertility. Talk to your care team if you are concerned about your fertility. What side effects may I notice from receiving this medication? Side effects that you should report to your care team as soon as possible: Allergic reactions--skin rash, itching, hives, swelling of the face, lips, tongue, or throat Infection--fever, chills, cough, sore throat, wounds that don't heal, pain or trouble when passing urine, general feeling of discomfort or being unwell Kidney injury--decrease in the amount of urine, swelling of the ankles, hands, or feet Liver injury--right upper belly pain, loss  of appetite, nausea, light-colored stool, dark yellow or brown urine, yellowing skin or eyes, unusual weakness or fatigue Low red blood cell level--unusual weakness or fatigue, dizziness, headache, trouble breathing Tumor lysis syndrome (TLS)--nausea, vomiting, diarrhea, decrease in the amount of urine, dark urine, unusual weakness or fatigue, confusion, muscle pain or cramps, fast or irregular heartbeat, joint pain Unusual bruising or bleeding Side effects that usually do not require medical attention (report to your care team if they continue or are bothersome): Constipation Diarrhea Nausea Pain, redness, or irritation at injection site Vomiting This list may not describe all possible side effects. Call your doctor for medical advice about side effects. You may report side effects to FDA at 1-800-FDA-1088. Where should I keep my medication? This medication is given in a hospital or clinic. It will not be stored at home. NOTE: This sheet is a summary. It may not cover all possible information. If you have questions about this medicine, talk to your doctor, pharmacist, or health care provider.  2024 Elsevier/Gold Standard (2021-11-19 00:00:00)        To help prevent nausea and vomiting after your treatment, we encourage you to take your nausea medication as directed.  BELOW ARE SYMPTOMS   THAT SHOULD BE REPORTED IMMEDIATELY: *FEVER GREATER THAN 100.4 F (38 C) OR HIGHER *CHILLS OR SWEATING *NAUSEA AND VOMITING THAT IS NOT CONTROLLED WITH YOUR NAUSEA MEDICATION *UNUSUAL SHORTNESS OF BREATH *UNUSUAL BRUISING OR BLEEDING *URINARY PROBLEMS (pain or burning when urinating, or frequent urination) *BOWEL PROBLEMS (unusual diarrhea, constipation, pain near the anus) TENDERNESS IN MOUTH AND THROAT WITH OR WITHOUT PRESENCE OF ULCERS (sore throat, sores in mouth, or a toothache) UNUSUAL RASH, SWELLING OR PAIN  UNUSUAL VAGINAL DISCHARGE OR ITCHING   Items with * indicate a potential emergency and  should be followed up as soon as possible or go to the Emergency Department if any problems should occur.  Please show the CHEMOTHERAPY ALERT CARD or IMMUNOTHERAPY ALERT CARD at check-in to the Emergency Department and triage nurse.  Should you have questions after your visit or need to cancel or reschedule your appointment, please contact [facility] [place] [phone]  and follow the prompts.  Office hours are 8:00 a.m. to 4:30 p.m. Monday - Friday. Please note that voicemails left after 4:00 p.m. may not be returned until the following business day.  We are closed weekends and major holidays. You have access to a nurse at all times for urgent questions. Please call the main number to the clinic [phone] and follow the prompts.  For any non-urgent questions, you may also contact your provider using MyChart. We now offer e-Visits for anyone 18 and older to request care online for non-urgent symptoms. For details visit [email or web address].   Also download the MyChart app! Go to the app store, search "MyChart", open the app, select [facility], and log in with your MyChart username and password.   

## 2023-05-20 ENCOUNTER — Encounter: Payer: Self-pay | Admitting: Hematology

## 2023-05-20 ENCOUNTER — Inpatient Hospital Stay: Payer: Medicare HMO | Attending: Hematology

## 2023-05-20 VITALS — BP 105/61 | HR 71 | Temp 97.8°F | Resp 18 | Wt 134.6 lb

## 2023-05-20 DIAGNOSIS — Z79899 Other long term (current) drug therapy: Secondary | ICD-10-CM | POA: Insufficient documentation

## 2023-05-20 DIAGNOSIS — Z5111 Encounter for antineoplastic chemotherapy: Secondary | ICD-10-CM | POA: Diagnosis not present

## 2023-05-20 DIAGNOSIS — Z95828 Presence of other vascular implants and grafts: Secondary | ICD-10-CM

## 2023-05-20 DIAGNOSIS — D696 Thrombocytopenia, unspecified: Secondary | ICD-10-CM | POA: Insufficient documentation

## 2023-05-20 DIAGNOSIS — C9310 Chronic myelomonocytic leukemia not having achieved remission: Secondary | ICD-10-CM | POA: Insufficient documentation

## 2023-05-20 MED ORDER — SODIUM CHLORIDE 0.9 % IV SOLN
75.0000 mg/m2 | Freq: Once | INTRAVENOUS | Status: AC
  Administered 2023-05-20: 126 mg via INTRAVENOUS
  Filled 2023-05-20: qty 12.6

## 2023-05-20 MED ORDER — SODIUM CHLORIDE 0.9% FLUSH
10.0000 mL | Freq: Once | INTRAVENOUS | Status: AC
  Administered 2023-05-20: 10 mL via INTRAVENOUS

## 2023-05-20 MED ORDER — SODIUM CHLORIDE 0.9 % IV SOLN: Freq: Once | INTRAVENOUS | Status: AC

## 2023-05-20 MED ORDER — PALONOSETRON HCL INJECTION 0.25 MG/5ML
0.2500 mg | Freq: Once | INTRAVENOUS | Status: AC
  Administered 2023-05-20: 0.25 mg via INTRAVENOUS
  Filled 2023-05-20: qty 5

## 2023-05-20 MED ORDER — HEPARIN SOD (PORK) LOCK FLUSH 100 UNIT/ML IV SOLN
500.0000 [IU] | Freq: Once | INTRAVENOUS | Status: AC
  Administered 2023-05-20: 500 [IU] via INTRAVENOUS

## 2023-05-20 NOTE — Progress Notes (Signed)

## 2023-05-20 NOTE — Patient Instructions (Signed)

## 2023-05-23 ENCOUNTER — Inpatient Hospital Stay: Payer: Medicare HMO | Admitting: Hematology

## 2023-05-23 ENCOUNTER — Other Ambulatory Visit: Payer: Medicare HMO

## 2023-05-23 ENCOUNTER — Ambulatory Visit: Payer: Medicare HMO

## 2023-05-24 ENCOUNTER — Inpatient Hospital Stay: Payer: Medicare HMO

## 2023-05-25 ENCOUNTER — Inpatient Hospital Stay: Payer: Medicare HMO

## 2023-05-26 ENCOUNTER — Ambulatory Visit: Payer: Medicare HMO

## 2023-05-27 ENCOUNTER — Ambulatory Visit: Payer: Medicare HMO

## 2023-06-03 DIAGNOSIS — C9310 Chronic myelomonocytic leukemia not having achieved remission: Secondary | ICD-10-CM | POA: Diagnosis not present

## 2023-06-03 DIAGNOSIS — C9200 Acute myeloblastic leukemia, not having achieved remission: Secondary | ICD-10-CM | POA: Diagnosis not present

## 2023-06-06 ENCOUNTER — Ambulatory Visit: Payer: Medicare HMO

## 2023-06-06 DIAGNOSIS — J4550 Severe persistent asthma, uncomplicated: Secondary | ICD-10-CM | POA: Diagnosis not present

## 2023-06-07 ENCOUNTER — Other Ambulatory Visit: Payer: Self-pay

## 2023-06-20 ENCOUNTER — Inpatient Hospital Stay: Payer: Medicare HMO | Attending: Hematology

## 2023-06-20 VITALS — BP 122/68 | HR 78 | Temp 97.7°F | Resp 18 | Wt 133.0 lb

## 2023-06-20 DIAGNOSIS — Z800 Family history of malignant neoplasm of digestive organs: Secondary | ICD-10-CM | POA: Diagnosis not present

## 2023-06-20 DIAGNOSIS — D539 Nutritional anemia, unspecified: Secondary | ICD-10-CM | POA: Insufficient documentation

## 2023-06-20 DIAGNOSIS — D696 Thrombocytopenia, unspecified: Secondary | ICD-10-CM | POA: Diagnosis not present

## 2023-06-20 DIAGNOSIS — Z79899 Other long term (current) drug therapy: Secondary | ICD-10-CM | POA: Diagnosis not present

## 2023-06-20 DIAGNOSIS — Z881 Allergy status to other antibiotic agents status: Secondary | ICD-10-CM | POA: Insufficient documentation

## 2023-06-20 DIAGNOSIS — R634 Abnormal weight loss: Secondary | ICD-10-CM | POA: Diagnosis not present

## 2023-06-20 DIAGNOSIS — R059 Cough, unspecified: Secondary | ICD-10-CM | POA: Insufficient documentation

## 2023-06-20 DIAGNOSIS — K219 Gastro-esophageal reflux disease without esophagitis: Secondary | ICD-10-CM | POA: Diagnosis not present

## 2023-06-20 DIAGNOSIS — Z818 Family history of other mental and behavioral disorders: Secondary | ICD-10-CM | POA: Diagnosis not present

## 2023-06-20 DIAGNOSIS — Z5111 Encounter for antineoplastic chemotherapy: Secondary | ICD-10-CM | POA: Insufficient documentation

## 2023-06-20 DIAGNOSIS — Z833 Family history of diabetes mellitus: Secondary | ICD-10-CM | POA: Diagnosis not present

## 2023-06-20 DIAGNOSIS — Z83518 Family history of other specified eye disorder: Secondary | ICD-10-CM | POA: Diagnosis not present

## 2023-06-20 DIAGNOSIS — C9310 Chronic myelomonocytic leukemia not having achieved remission: Secondary | ICD-10-CM | POA: Insufficient documentation

## 2023-06-20 DIAGNOSIS — Z95828 Presence of other vascular implants and grafts: Secondary | ICD-10-CM

## 2023-06-20 DIAGNOSIS — Z825 Family history of asthma and other chronic lower respiratory diseases: Secondary | ICD-10-CM | POA: Diagnosis not present

## 2023-06-20 DIAGNOSIS — Z882 Allergy status to sulfonamides status: Secondary | ICD-10-CM | POA: Diagnosis not present

## 2023-06-20 DIAGNOSIS — Z9089 Acquired absence of other organs: Secondary | ICD-10-CM | POA: Insufficient documentation

## 2023-06-20 DIAGNOSIS — Z807 Family history of other malignant neoplasms of lymphoid, hematopoietic and related tissues: Secondary | ICD-10-CM | POA: Insufficient documentation

## 2023-06-20 DIAGNOSIS — K5900 Constipation, unspecified: Secondary | ICD-10-CM | POA: Insufficient documentation

## 2023-06-20 DIAGNOSIS — Z8249 Family history of ischemic heart disease and other diseases of the circulatory system: Secondary | ICD-10-CM | POA: Diagnosis not present

## 2023-06-20 DIAGNOSIS — E538 Deficiency of other specified B group vitamins: Secondary | ICD-10-CM | POA: Insufficient documentation

## 2023-06-20 DIAGNOSIS — R079 Chest pain, unspecified: Secondary | ICD-10-CM | POA: Diagnosis not present

## 2023-06-20 DIAGNOSIS — R161 Splenomegaly, not elsewhere classified: Secondary | ICD-10-CM | POA: Insufficient documentation

## 2023-06-20 LAB — COMPREHENSIVE METABOLIC PANEL
ALT: 11 U/L (ref 0–44)
AST: 28 U/L (ref 15–41)
Albumin: 4.2 g/dL (ref 3.5–5.0)
Alkaline Phosphatase: 62 U/L (ref 38–126)
Anion gap: 8 (ref 5–15)
BUN: 11 mg/dL (ref 8–23)
CO2: 26 mmol/L (ref 22–32)
Calcium: 9.3 mg/dL (ref 8.9–10.3)
Chloride: 104 mmol/L (ref 98–111)
Creatinine, Ser: 0.78 mg/dL (ref 0.44–1.00)
GFR, Estimated: >60 mL/min (ref >60)
Glucose, Bld: 95 mg/dL (ref 70–99)
Potassium: 3.6 mmol/L (ref 3.5–5.1)
Sodium: 138 mmol/L (ref 135–145)
Total Bilirubin: 1.2 mg/dL — ABNORMAL HIGH (ref <1.2)
Total Protein: 7 g/dL (ref 6.5–8.1)

## 2023-06-20 LAB — CBC WITH DIFFERENTIAL/PLATELET
Abs Immature Granulocytes: 0.4 10*3/uL — ABNORMAL HIGH (ref 0.00–0.07)
Band Neutrophils: 1 %
Basophils Absolute: 0 10*3/uL (ref 0.0–0.1)
Basophils Relative: 0 %
Eosinophils Absolute: 0 10*3/uL (ref 0.0–0.5)
Eosinophils Relative: 0 %
HCT: 27.6 % — ABNORMAL LOW (ref 36.0–46.0)
Hemoglobin: 8.2 g/dL — ABNORMAL LOW (ref 12.0–15.0)
Lymphocytes Relative: 27 %
Lymphs Abs: 1.4 10*3/uL (ref 0.7–4.0)
MCH: 29.2 pg (ref 26.0–34.0)
MCHC: 29.7 g/dL — ABNORMAL LOW (ref 30.0–36.0)
MCV: 98.2 fL (ref 80.0–100.0)
Metamyelocytes Relative: 4 %
Monocytes Absolute: 0.4 10*3/uL (ref 0.1–1.0)
Monocytes Relative: 7 %
Myelocytes: 4 %
Neutro Abs: 2.9 10*3/uL (ref 1.7–7.7)
Neutrophils Relative %: 57 %
Platelets: 160 10*3/uL (ref 150–400)
RBC: 2.81 MIL/uL — ABNORMAL LOW (ref 3.87–5.11)
RDW: 20.1 % — ABNORMAL HIGH (ref 11.5–15.5)
WBC: 5 10*3/uL (ref 4.0–10.5)
nRBC: 2 /100{WBCs} — ABNORMAL HIGH
nRBC: 2.8 % — ABNORMAL HIGH (ref 0.0–0.2)

## 2023-06-20 LAB — MAGNESIUM: Magnesium: 2 mg/dL (ref 1.7–2.4)

## 2023-06-20 MED ORDER — AZACITIDINE CHEMO INJECTION 100 MG
75.0000 mg/m2 | Freq: Once | INTRAMUSCULAR | Status: AC
  Administered 2023-06-20: 126 mg via INTRAVENOUS
  Filled 2023-06-20: qty 12.6

## 2023-06-20 MED ORDER — HEPARIN SOD (PORK) LOCK FLUSH 100 UNIT/ML IV SOLN
500.0000 [IU] | Freq: Once | INTRAVENOUS | Status: AC
  Administered 2023-06-20: 500 [IU] via INTRAVENOUS

## 2023-06-20 MED ORDER — SODIUM CHLORIDE 0.9 % IV SOLN: Freq: Once | INTRAVENOUS | Status: AC

## 2023-06-20 MED ORDER — SODIUM CHLORIDE 0.9% FLUSH
10.0000 mL | Freq: Once | INTRAVENOUS | Status: AC
  Administered 2023-06-20: 10 mL via INTRAVENOUS

## 2023-06-20 MED ORDER — PALONOSETRON HCL INJECTION 0.25 MG/5ML
0.2500 mg | Freq: Once | INTRAVENOUS | Status: AC
  Administered 2023-06-20: 0.25 mg via INTRAVENOUS
  Filled 2023-06-20: qty 5

## 2023-06-20 NOTE — Progress Notes (Signed)
[name] [address], [place] [address]    Clinic Day:  06/21/2023  Referring physician: Carylon Perches, MD  Patient Care Team: Carylon Perches, MD as PCP - General (Internal Medicine) Jena Gauss Gerrit Friends, MD as Attending Physician (Gastroenterology) Storm Frisk, MD as Consulting Physician (Pulmonary Disease) Doreatha Massed, MD as Medical Oncologist (Hematology)   ASSESSMENT & PLAN:   Assessment: 1.  Macrocytic anemia and thrombocytopenia: - Patient seen at the request of Dr. Ouida Sills for abnormal CBC. - 04/17/2022: WBC 15.8, Hb-9.7, PLT-128 - 03/24/2022: WBC-8.1 (N-43%, L-17%, M-19%, B-2%), Hb-9.6, MCV-99, PLT-115 - Smear review: 13% metamyelocytes, 2% myelocytes, 2% blasts,  - 10/19/2021: WBC-5.3 (N-61, L-21, M-10%), Hb-11, MCV-96, PLT-97 - BMBX (05/25/2022): Hypercellular bone marrow with myeloid hyperplasia with dysgranulopoiesis, erythroid hypoplasia and megakaryocytic hyperplasia with dyspoiesis.  Chromosome analysis was 47, XX.  Bone marrow blasts less than 5%.  Peripheral blood blasts less than 2%. - Mayo molecular model risk stratification: Intermediate 2 risk with at least 2 points (decreased hemoglobin less than 10, circulating immature cells).  Intermediate 2 risk with median OS 31 months. - Serum EPO level 41. - NGS: Positive for CBL, MPL, SRSF2, TET 2, RAD21 - PET scan (07/08/2022): Splenomegaly (volume 510 mm) with normal metabolic activity.  Uniform increase in marrow metabolic activity related to patient's anemia.  No lymphadenopathy. - BMBX (07/16/2022): Hypercellular bone marrow (95-100%) with myeloid hyperplasia, atypical monocytes, increased blasts (16% overall).  Atypical monocytosis present 23% of total events, expressing HLA-DR, CD38, CD4 dim, CD11C, CD13, CD14, CD36, CD64 with aberrant coexpression of CD56 and CD7. - Cycle 1 of azacitidine on 08/02/2022, cycle 2 on 08/30/2022.  Azacitidine decreased to 5 days starting cycle 5 on 11/22/2022 - BMBX  (09/21/2022): 3% blasts, hypercellular marrow with markedly increased dyspoietic megakaryocytes, increased fibrosis - BMBX (11/16/2022): Hypercellular marrow (70-80%) with marked GIST [name] (, myelofibrosis and 3% blasts on a very limited sample. - BMBX (05/06/2023): Hypercellular marrow with 85% cellularity with fibrosis and 4% blasts approximately.   2.  Social/family history: - She lives at home by herself.  Son lives next door. - Does part-time work at [employer] [name].  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  Non-smoker. - Maternal aunt had tumor in the breast, patient not certain if it is cancer.  2 maternal first cousins had lymphoma.  Mother had stomach cancer.    Plan: 1.  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 - Denied any infections.  No GI side effects noted.  Energy levels are unchanged. - Reviewed labs today: Hemoglobin is 8.2 with normal white count and platelet count.  LFTs are normal. - She will proceed with azacitidine 75 mg/m x 5 days (cycle 11) today. - She will follow-up with Dr. Lowell Guitar on 07/08/2023 for bone marrow biopsy. - RTC 4 weeks for follow-up.   2.  Folate deficiency: - Continue folic acid tablet daily.   3.  Weight loss: - She is continuing to eat well and not requiring Megace.   4.  Constipation: - Continue Colace daily and lactulose as needed.  This is well-controlled.    No orders of the defined types were placed in this encounter.     Alben Deeds Teague,acting as a Neurosurgeon for Doreatha Massed, MD.,have documented all relevant documentation on the behalf of Doreatha Massed, MD,as directed by  Doreatha Massed, MD while in the presence of Doreatha Massed, MD.  I, Vern Claude  Ellin Saba MD, have reviewed the above documentation for accuracy and completeness, and I agree with the above.    Doreatha Massed, MD   12/3/20241:07 PM  CHIEF  COMPLAINT:   Diagnosis: CMML-2    Cancer Staging  No matching staging information was found for the patient.    Prior Therapy: none  Current Therapy:  Azacitidine 70 mg/m x 7 days every 28 days    HISTORY OF PRESENT ILLNESS:   Oncology History  CMML (chronic myelomonocytic leukemia) (HCC)  06/03/2022 Initial Diagnosis   CMML (chronic myelomonocytic leukemia) (HCC)   08/02/2022 -  Chemotherapy   Patient is on Treatment Plan : MYELODYSPLASIA  Azacitidine IVPB D1-7 q28d        INTERVAL HISTORY:   [name] is a 72 y.o. female presenting to clinic today for follow up of CMML-2. She was last seen by me on 05/16/23.  Today, she states that she is doing well overall. Her appetite level is at 75%. Her energy level is at 80%. She is accompanied by a family member.   She c/o a cough and occasional chest pain once a week with associated heart flutters. She denies any infections in the last 5 weeks. She denies any side effects from last treatment.   She reports Dr. Lowell Guitar told her she was slightly anemic at her last visit with him. She denies any tiredness, dizziness, or bleeding issues. She notes a normal appetite.   PAST MEDICAL HISTORY:   Past Medical History: Past Medical History:  Diagnosis Date   Anxiety    Asthma    had 1 episode 1 year ago-no more problem   CHF (congestive heart failure) (HCC)    swelling of feet & ankles   CMML (chronic myelomonocytic leukemia) (HCC) 06/03/2022   Cough    Depression    OCD   GERD (gastroesophageal reflux disease)    severe   OCD (obsessive compulsive disorder)    Port-A-Cath in place 07/26/2022   Shortness of breath    with exertion   Yeast infection 04/09/2014    Surgical History: Past Surgical History:  Procedure Laterality Date   CARPAL TUNNEL RELEASE  03/22/2012   Procedure: CARPAL TUNNEL RELEASE;  Surgeon: Nicki Reaper, MD;  Location:  [name];  Service: Orthopedics;  Laterality: Right;  right carpal tunnel  release   COLONOSCOPY  06/19/2012   Dr. Rourk:normal rectum and colon    ESOPHAGOGASTRODUODENOSCOPY N/A 12/26/2015   Dr. Jena Gauss: normal    IR IMAGING GUIDED PORT INSERTION  07/30/2022   TONSILLECTOMY     TUBAL LIGATION      Social History: Social History   Socioeconomic History   Marital status: Widowed    Spouse name: Not on file   Number of children: 1   Years of education: Not on file   Highest education level: Not on file  Occupational History   Occupation: retired  Tobacco Use   Smoking status: Never    Passive exposure: Yes   Smokeless tobacco: Never  Vaping Use   Vaping status: Never Used  Substance and Sexual Activity   Alcohol use: No   Drug use: No   Sexual activity: Yes    Birth control/protection: Surgical    Comment: tubal  Other Topics Concern   Not on file  Social History Narrative   Not on file   Social Determinants of Health   Financial Resource Strain: Not on file  Food Insecurity: Not on file  Transportation Needs: Not on file  Physical Activity: Not on file  Stress: Not on file  Social Connections: Not on file  Intimate Partner Violence: Not on file    Family History: Family History  Problem Relation Age of Onset   Asthma Mother    Macular degeneration Mother    Hypertension Mother    Stomach cancer Mother 8   Alzheimer's disease Father    Other Sister        blood clots   Diabetes Maternal Grandfather    Diabetes Paternal Grandfather    Other Son        enlarged spleen   Lymphoma Cousin        x2, both dx in their 84s   Colon cancer Neg Hx     Current Medications:  Current Outpatient Medications:    albuterol (VENTOLIN HFA) 108 (90 Base) MCG/ACT inhaler, INHALE 2 PUFFS BY MOUTH EVERY 6 HOURS AS NEEDED FOR SHORTNESS OF BREATH OR WHEEZING., Disp: 18 g, Rfl: 2   albuterol (VENTOLIN HFA) 108 (90 Base) MCG/ACT inhaler, Inhale 2 puffs into the lungs every 4 (four) hours as needed for wheezing or shortness of breath., Disp: 18 g, Rfl:  1   ARNUITY ELLIPTA 200 MCG/ACT AEPB, Inhale 1 puff into the lungs daily., Disp: 30 each, Rfl: 5   azaCITIDine 5 mg/2 mLs in lactated ringers infusion, Inject into the vein daily. Days 1-7 every 28 days, Disp: , Rfl:    azelastine (ASTELIN) 0.1 % nasal spray, SPRAY 1 TO 2 SPRAYS PER NOSTRIL TWICE DAILY., Disp: 30 mL, Rfl: 3   benzonatate (TESSALON) 200 MG capsule, Take 200 mg by mouth 3 (three) times daily as needed., Disp: , Rfl:    BREZTRI AEROSPHERE 160-9-4.8 MCG/ACT AERO, Inhale 2 puffs into the lungs 2 (two) times daily., Disp: 10.7 g, Rfl: 5   cetirizine (ZYRTEC) 5 MG tablet, Take 1 tablet (5 mg total) by mouth daily., Disp: 30 tablet, Rfl: 0   ciclopirox (PENLAC) 8 % solution, APPLY TOPICALLY AT BEDTIME. APPLY OVER NAIL FOLD AND SURROUNDING SKIN. APPLY DAILY OVER PREVIOUS COAT. AFTER SEVEN DAYS, MAY REMOVE WITH ALCOHOL AND CONTINUE CYCLE., Disp: 6.6 mL, Rfl: 2   EPINEPHrine 0.3 mg/0.3 mL IJ SOAJ injection, Inject 0.3 mg into the muscle as needed for anaphylaxis., Disp: 2 each, Rfl: 1   famotidine (PEPCID) 20 MG tablet, Take 1 tablet (20 mg total) by mouth 2 (two) times daily., Disp: 60 tablet, Rfl: 5   FLUoxetine (PROZAC) 20 MG capsule, Take 1 capsule by mouth daily., Disp: , Rfl:    fluticasone (FLONASE) 50 MCG/ACT nasal spray, Place 2 sprays into both nostrils 2 (two) times daily., Disp: 16 g, Rfl: 5   fluticasone (FLOVENT HFA) 110 MCG/ACT inhaler, Inhale two puffs twice daily to prevent cough or wheeze. Rinse mouth after use., Disp: 12 g, Rfl: 5   folic acid (FOLVITE) 1 MG tablet, TAKE ONE TABLET BY MOUTH ONCE DAILY, Disp: 30 tablet, Rfl: 5   gabapentin (NEURONTIN) 300 MG capsule, , Disp: , Rfl:    HYDROcodone bit-homatropine (HYCODAN) 5-1.5 MG/5ML syrup, , Disp: , Rfl:    ipratropium (ATROVENT) 0.06 % nasal spray, USE 2 SPRAYS IN EACH NOSTRIL THREE TIMES A DAY AS NEEDED., Disp: 15 mL, Rfl: 5   lactulose (CHRONULAC) 10 GM/15ML solution, take 30ml BY MOUTH EVERY THREE HOURS UNTIL bowel  movement; THEN take 30ml ONCE DAILY AS NEEDED FOR CONSTIPATION, Disp: 450 mL, Rfl: 2   lidocaine-prilocaine (EMLA) cream, Apply a quarter sized amount to port a cath  site and cover with plastic wrap one hour prior to infusion appointments, Disp: 30 g, Rfl: 3   megestrol (MEGACE) 400 MG/10ML suspension, Take 10 mLs (400 mg total) by mouth 2 (two) times daily., Disp: 480 mL, Rfl: 2   montelukast (SINGULAIR) 10 MG tablet, Take 1 tablet (10 mg total) by mouth at bedtime., Disp: 30 tablet, Rfl: 5   NON FORMULARY, [facility]  Antifungal (nail)-#1, Disp: , Rfl:    pantoprazole (PROTONIX) 40 MG tablet, Take 1 tablet (40 mg total) by mouth daily., Disp: 30 tablet, Rfl: 5   prochlorperazine (COMPAZINE) 10 MG tablet, Take 1 tablet (10 mg total) by mouth every 6 (six) hours as needed for nausea or vomiting., Disp: 30 tablet, Rfl: 0   Respiratory Therapy Supplies (FLUTTER) DEVI, Use as directed, Disp: 1 each, Rfl: 3   SF 5000 PLUS 1.1 % CREA dental cream, , Disp: , Rfl:    Spacer/Aero-Holding Chambers (AEROCHAMBER PLUS WITH MASK) inhaler, 1 each by Other route See admin instructions. Use with inhaler as instructed., Disp: 1 each, Rfl: 1   Tezepelumab-ekko (TEZSPIRE) 210 MG/1. SOAJ, Inject 210 mg into the skin every 28 (twenty-eight) days., Disp: 1.91 mL, Rfl: 11   tiZANidine (ZANAFLEX) 2 MG tablet, Take 1 tablet (2 mg total) by mouth 2 (two) times daily as needed for muscle spasms., Disp: 15 tablet, Rfl: 0   traZODone (DESYREL) 50 MG tablet, TAKE ONE TABLET BY MOUTH AT BEDTIME, Disp: 30 tablet, Rfl: 3  Current Facility-Administered Medications:    tezepelumab-ekko (TEZSPIRE) 210 MG/1. syringe 210 mg, 210 mg, Subcutaneous, Q28 days, Marcelyn Bruins, MD, 210 mg at 06/06/23 4696   Allergies: Allergies  Allergen Reactions   Levonorgestrel-Ethinyl Estrad Cough   Sulfa Antibiotics Other (See Comments)    Unknown- pt unsure of reaction; believes it may be nausea    Sulfamethoxazole-Trimethoprim Other (See Comments)   Cephalosporins Other (See Comments)    Other Reaction: Toxicity    REVIEW OF SYSTEMS:   Review of Systems  Constitutional:  Negative for chills, fatigue and fever.  HENT:   Negative for lump/mass, mouth sores, nosebleeds, sore throat and trouble swallowing.   Eyes:  Negative for eye problems.  Respiratory:  Positive for cough and shortness of breath.   Cardiovascular:  Negative for chest pain, leg swelling and palpitations.  Gastrointestinal:  Negative for abdominal pain, constipation, diarrhea, nausea and vomiting.  Genitourinary:  Negative for bladder incontinence, difficulty urinating, dysuria, frequency, hematuria and nocturia.   Musculoskeletal:  Negative for arthralgias, back pain, flank pain, myalgias and neck pain.  Skin:  Negative for itching and rash.  Neurological:  Negative for dizziness, headaches and numbness.  Hematological:  Does not bruise/bleed easily.  Psychiatric/Behavioral:  Negative for depression, sleep disturbance and suicidal ideas. The patient is not nervous/anxious.   All other systems reviewed and are negative.    VITALS:   Blood pressure 104/76, pulse 95, temperature (!) 97.3 F (36.3 C), temperature source Tympanic, resp. rate 20, weight 133 lb 3.2 oz (60.4 kg), SpO2 96%.  Wt Readings from Last 3 Encounters:  06/21/23 133 lb 3.2 oz (60.4 kg)  06/20/23 133 lb (60.3 kg)  05/20/23 134 lb 9.6 oz (61.1 kg)    Body mass index is 26.01 kg/m.  Performance status (ECOG): 1 - Symptomatic but completely ambulatory  PHYSICAL EXAM:   Physical Exam Vitals and nursing note reviewed. Exam conducted with a chaperone present.  Constitutional:      Appearance: Normal appearance.  Cardiovascular:  Rate and Rhythm: Normal rate and regular rhythm.     Pulses: Normal pulses.     Heart sounds: Normal heart sounds.  Pulmonary:     Effort: Pulmonary effort is normal.     Breath sounds: Normal breath sounds.   Abdominal:     Palpations: Abdomen is soft. There is no hepatomegaly, splenomegaly or mass.     Tenderness: There is no abdominal tenderness.  Musculoskeletal:     Right lower leg: No edema.     Left lower leg: No edema.  Lymphadenopathy:     Cervical: No cervical adenopathy.     Right cervical: No superficial, deep or posterior cervical adenopathy.    Left cervical: No superficial, deep or posterior cervical adenopathy.     Upper Body:     Right upper body: No supraclavicular or axillary adenopathy.     Left upper body: No supraclavicular or axillary adenopathy.  Neurological:     General: No focal deficit present.     Mental Status: She is alert and oriented to person, place, and time.  Psychiatric:        Mood and Affect: Mood normal.        Behavior: Behavior normal.     LABS:      Latest Ref Rng & Units 06/20/2023    2:06 PM 05/16/2023    9:08 AM 04/18/2023   10:31 AM  CBC  WBC 4.0 - 10.5 K/uL 5.0  4.2  3.8   Hemoglobin 12.0 - 15.0 g/dL 8.2  8.5  9.1   Hematocrit 36.0 - 46.0 % 27.6  27.9  29.0   Platelets 150 - 400 K/uL 160  165  117       Latest Ref Rng & Units 06/20/2023    2:04 PM 05/16/2023    9:08 AM 04/18/2023   10:31 AM  CMP  Glucose 70 - 99 mg/dL 95  95  161   BUN 8 - 23 mg/dL 11  10  16    Creatinine 0.44 - 1.00 mg/dL 0.96  0.45  4.09   Sodium 135 - 145 mmol/L 138  138  137   Potassium 3.5 - 5.1 mmol/L 3.6  4.0  4.0   Chloride 98 - 111 mmol/L 104  104  104   CO2 22 - 32 mmol/L 26  25  25    Calcium 8.9 - 10.3 mg/dL 9.3  9.3  9.0   Total Protein 6.5 - 8.1 g/dL 7.0  7.0  7.1   Total Bilirubin <1.2 mg/dL 1.2  1.2  1.0   Alkaline Phos 38 - 126 U/L 62  59  76   AST 15 - 41 U/L 28  35  24   ALT 0 - 44 U/L 11  14  12       No results found for: "CEA1", "CEA" / No results found for: "CEA1", "CEA" No results found for: "PSA1" No results found for: "CAN199" No results found for: "CAN125"  Lab Results  Component Value Date   TOTALPROTELP 6.3 04/26/2022    ALBUMINELP 3.8 04/26/2022   A1GS 0.3 04/26/2022   A2GS 0.5 04/26/2022   BETS 0.8 04/26/2022   GAMS 0.9 04/26/2022   MSPIKE Not Observed 04/26/2022   SPEI Comment 04/26/2022   Lab Results  Component Value Date   TIBC 343 04/26/2022   FERRITIN 114 04/26/2022   IRONPCTSAT 25 04/26/2022   Lab Results  Component Value Date   LDH 650 (H) 04/18/2023   LDH 704 (H) 03/07/2023  LDH 900 (H) 01/24/2023     STUDIES:   No results found.

## 2023-06-20 NOTE — Progress Notes (Signed)
Labs drawn for tomorrow's appt and today's infusion and ok to treat verbal order Dr. Ellin Saba.   Patient tolerated chemotherapy with no complaints voiced.  Side effects with management reviewed with understanding verbalized.  Port site clean and dry with no bruising or swelling noted at site.  Good blood return noted before and after administration of chemotherapy.  Dressing intact.   Patient left in satisfactory condition with VSS and no s/s of distress noted.

## 2023-06-20 NOTE — Patient Instructions (Signed)
[facility] [place] - [facility]  Discharge Instructions: Thank you for choosing [facility] to provide your oncology and hematology care.  If you have a lab appointment with the Cancer Center - please note that after April 8th, 2024, all labs will be drawn in the cancer center.  You do not have to check in or register with the main entrance as you have in the past but will complete your check-in in the cancer center.  Wear comfortable clothing and clothing appropriate for easy access to any Portacath or PICC line.   We strive to give you quality time with your provider. You may need to reschedule your appointment if you arrive late (15 or more minutes).  Arriving late affects you and other patients whose appointments are after yours.  Also, if you miss three or more appointments without notifying the office, you may be dismissed from the clinic at the provider's discretion.      For prescription refill requests, have your pharmacy contact our office and allow 72 hours for refills to be completed.    Today you received the following chemotherapy and/or immunotherapy agents vidaza.       To help prevent nausea and vomiting after your treatment, we encourage you to take your nausea medication as directed.  BELOW ARE SYMPTOMS THAT SHOULD BE REPORTED IMMEDIATELY: *FEVER GREATER THAN 100.4 F (38 C) OR HIGHER *CHILLS OR SWEATING *NAUSEA AND VOMITING THAT IS NOT CONTROLLED WITH YOUR NAUSEA MEDICATION *UNUSUAL SHORTNESS OF BREATH *UNUSUAL BRUISING OR BLEEDING *URINARY PROBLEMS (pain or burning when urinating, or frequent urination) *BOWEL PROBLEMS (unusual diarrhea, constipation, pain near the anus) TENDERNESS IN MOUTH AND THROAT WITH OR WITHOUT PRESENCE OF ULCERS (sore throat, sores in mouth, or a toothache) UNUSUAL RASH, SWELLING OR PAIN  UNUSUAL VAGINAL DISCHARGE OR ITCHING   Items with * indicate a potential emergency and should be followed up  as soon as possible or go to the Emergency Department if any problems should occur.  Please show the CHEMOTHERAPY ALERT CARD or IMMUNOTHERAPY ALERT CARD at check-in to the Emergency Department and triage nurse.  Should you have questions after your visit or need to cancel or reschedule your appointment, please contact [facility] [place] - A DEPT OF [facility] [phone]  and follow the prompts.  Office hours are 8:00 a.m. to 4:30 p.m. Monday - Friday. Please note that voicemails left after 4:00 p.m. may not be returned until the following business day.  We are closed weekends and major holidays. You have access to a nurse at all times for urgent questions. Please call the main number to the clinic [phone] and follow the prompts.  For any non-urgent questions, you may also contact your provider using MyChart. We now offer e-Visits for anyone 8 and older to request care online for non-urgent symptoms. For details visit [email or web address].   Also download the MyChart app! Go to the app store, search "MyChart", open the app, select [place], and log in with your MyChart username and password.

## 2023-06-21 ENCOUNTER — Inpatient Hospital Stay: Payer: Medicare HMO

## 2023-06-21 ENCOUNTER — Inpatient Hospital Stay: Payer: Medicare HMO | Admitting: Hematology

## 2023-06-21 VITALS — BP 101/63 | HR 70 | Temp 98.8°F | Resp 18

## 2023-06-21 VITALS — BP 104/76 | HR 95 | Temp 97.3°F | Resp 20 | Wt 133.2 lb

## 2023-06-21 DIAGNOSIS — K5900 Constipation, unspecified: Secondary | ICD-10-CM | POA: Diagnosis not present

## 2023-06-21 DIAGNOSIS — Z5111 Encounter for antineoplastic chemotherapy: Secondary | ICD-10-CM | POA: Diagnosis not present

## 2023-06-21 DIAGNOSIS — Z95828 Presence of other vascular implants and grafts: Secondary | ICD-10-CM

## 2023-06-21 DIAGNOSIS — D539 Nutritional anemia, unspecified: Secondary | ICD-10-CM | POA: Diagnosis not present

## 2023-06-21 DIAGNOSIS — C9310 Chronic myelomonocytic leukemia not having achieved remission: Secondary | ICD-10-CM | POA: Diagnosis not present

## 2023-06-21 DIAGNOSIS — R079 Chest pain, unspecified: Secondary | ICD-10-CM | POA: Diagnosis not present

## 2023-06-21 DIAGNOSIS — R634 Abnormal weight loss: Secondary | ICD-10-CM | POA: Diagnosis not present

## 2023-06-21 DIAGNOSIS — E538 Deficiency of other specified B group vitamins: Secondary | ICD-10-CM | POA: Diagnosis not present

## 2023-06-21 DIAGNOSIS — D696 Thrombocytopenia, unspecified: Secondary | ICD-10-CM | POA: Diagnosis not present

## 2023-06-21 DIAGNOSIS — R161 Splenomegaly, not elsewhere classified: Secondary | ICD-10-CM | POA: Diagnosis not present

## 2023-06-21 MED ORDER — HEPARIN SOD (PORK) LOCK FLUSH 100 UNIT/ML IV SOLN
500.0000 [IU] | Freq: Once | INTRAVENOUS | Status: AC
  Administered 2023-06-21: 500 [IU] via INTRAVENOUS

## 2023-06-21 MED ORDER — SODIUM CHLORIDE 0.9 % IV SOLN
75.0000 mg/m2 | Freq: Once | INTRAVENOUS | Status: AC
  Administered 2023-06-21: 126 mg via INTRAVENOUS
  Filled 2023-06-21: qty 12.6

## 2023-06-21 MED ORDER — SODIUM CHLORIDE 0.9% FLUSH
10.0000 mL | Freq: Once | INTRAVENOUS | Status: AC
Start: 2023-06-21 — End: 2023-06-21
  Administered 2023-06-21: 10 mL via INTRAVENOUS

## 2023-06-21 MED ORDER — SODIUM CHLORIDE 0.9 % IV SOLN: Freq: Once | INTRAVENOUS | Status: AC

## 2023-06-21 NOTE — Progress Notes (Signed)
Patient tolerated chemotherapy with no complaints voiced. Side effects with management reviewed understanding verbalized. Port site clean and dry with no bruising or swelling noted at site. Good blood return noted before and after administration of chemotherapy.  Patient left in satisfactory condition with VSS and no s/s of distress noted. 

## 2023-06-21 NOTE — Patient Instructions (Signed)

## 2023-06-21 NOTE — Patient Instructions (Signed)
[facility] [place] - [facility]  Discharge Instructions: Thank you for choosing [facility] to provide your oncology and hematology care.  If you have a lab appointment with the Cancer Center - please note that after April 8th, 2024, all labs will be drawn in the cancer center.  You do not have to check in or register with the main entrance as you have in the past but will complete your check-in in the cancer center.  Wear comfortable clothing and clothing appropriate for easy access to any Portacath or PICC line.   We strive to give you quality time with your provider. You may need to reschedule your appointment if you arrive late (15 or more minutes).  Arriving late affects you and other patients whose appointments are after yours.  Also, if you miss three or more appointments without notifying the office, you may be dismissed from the clinic at the provider's discretion.      For prescription refill requests, have your pharmacy contact our office and allow 72 hours for refills to be completed.    Today you received the following chemotherapy and/or immunotherapy agents Vidaza, return as scheduled.   To help prevent nausea and vomiting after your treatment, we encourage you to take your nausea medication as directed.  BELOW ARE SYMPTOMS THAT SHOULD BE REPORTED IMMEDIATELY: *FEVER GREATER THAN 100.4 F (38 C) OR HIGHER *CHILLS OR SWEATING *NAUSEA AND VOMITING THAT IS NOT CONTROLLED WITH YOUR NAUSEA MEDICATION *UNUSUAL SHORTNESS OF BREATH *UNUSUAL BRUISING OR BLEEDING *URINARY PROBLEMS (pain or burning when urinating, or frequent urination) *BOWEL PROBLEMS (unusual diarrhea, constipation, pain near the anus) TENDERNESS IN MOUTH AND THROAT WITH OR WITHOUT PRESENCE OF ULCERS (sore throat, sores in mouth, or a toothache) UNUSUAL RASH, SWELLING OR PAIN  UNUSUAL VAGINAL DISCHARGE OR ITCHING   Items with * indicate a potential emergency and  should be followed up as soon as possible or go to the Emergency Department if any problems should occur.  Please show the CHEMOTHERAPY ALERT CARD or IMMUNOTHERAPY ALERT CARD at check-in to the Emergency Department and triage nurse.  Should you have questions after your visit or need to cancel or reschedule your appointment, please contact [facility]  - A DEPT OF [facility] [phone]  and follow the prompts.  Office hours are 8:00 a.m. to 4:30 p.m. Monday - Friday. Please note that voicemails left after 4:00 p.m. may not be returned until the following business day.  We are closed weekends and major holidays. You have access to a nurse at all times for urgent questions. Please call the main number to the clinic [phone] and follow the prompts.  For any non-urgent questions, you may also contact your provider using MyChart. We now offer e-Visits for anyone 68 and older to request care online for non-urgent symptoms. For details visit [email or web address].   Also download the MyChart app! Go to the app store, search "MyChart", open the app, select [place], and log in with your MyChart username and password.

## 2023-06-22 ENCOUNTER — Inpatient Hospital Stay: Payer: Medicare HMO

## 2023-06-22 VITALS — BP 113/74 | HR 79 | Temp 97.0°F | Resp 18

## 2023-06-22 DIAGNOSIS — D539 Nutritional anemia, unspecified: Secondary | ICD-10-CM | POA: Diagnosis not present

## 2023-06-22 DIAGNOSIS — C9310 Chronic myelomonocytic leukemia not having achieved remission: Secondary | ICD-10-CM | POA: Diagnosis not present

## 2023-06-22 DIAGNOSIS — R634 Abnormal weight loss: Secondary | ICD-10-CM | POA: Diagnosis not present

## 2023-06-22 DIAGNOSIS — K5900 Constipation, unspecified: Secondary | ICD-10-CM | POA: Diagnosis not present

## 2023-06-22 DIAGNOSIS — E538 Deficiency of other specified B group vitamins: Secondary | ICD-10-CM | POA: Diagnosis not present

## 2023-06-22 DIAGNOSIS — D696 Thrombocytopenia, unspecified: Secondary | ICD-10-CM | POA: Diagnosis not present

## 2023-06-22 DIAGNOSIS — Z5111 Encounter for antineoplastic chemotherapy: Secondary | ICD-10-CM | POA: Diagnosis not present

## 2023-06-22 DIAGNOSIS — R079 Chest pain, unspecified: Secondary | ICD-10-CM | POA: Diagnosis not present

## 2023-06-22 DIAGNOSIS — R161 Splenomegaly, not elsewhere classified: Secondary | ICD-10-CM | POA: Diagnosis not present

## 2023-06-22 DIAGNOSIS — Z95828 Presence of other vascular implants and grafts: Secondary | ICD-10-CM

## 2023-06-22 MED ORDER — SODIUM CHLORIDE 0.9 % IV SOLN
75.0000 mg/m2 | Freq: Once | INTRAVENOUS | Status: AC
  Administered 2023-06-22: 126 mg via INTRAVENOUS
  Filled 2023-06-22: qty 12.6

## 2023-06-22 MED ORDER — SODIUM CHLORIDE 0.9 % IV SOLN: Freq: Once | INTRAVENOUS | Status: AC

## 2023-06-22 MED ORDER — PALONOSETRON HCL INJECTION 0.25 MG/5ML
0.2500 mg | Freq: Once | INTRAVENOUS | Status: AC
  Administered 2023-06-22: 0.25 mg via INTRAVENOUS
  Filled 2023-06-22: qty 5

## 2023-06-22 NOTE — Patient Instructions (Signed)
[facility] [place] - [facility]  Discharge Instructions: Thank you for choosing [facility] to provide your oncology and hematology care.  If you have a lab appointment with the Cancer Center - please note that after April 8th, 2024, all labs will be drawn in the cancer center.  You do not have to check in or register with the main entrance as you have in the past but will complete your check-in in the cancer center.  Wear comfortable clothing and clothing appropriate for easy access to any Portacath or PICC line.   We strive to give you quality time with your provider. You may need to reschedule your appointment if you arrive late (15 or more minutes).  Arriving late affects you and other patients whose appointments are after yours.  Also, if you miss three or more appointments without notifying the office, you may be dismissed from the clinic at the provider's discretion.      For prescription refill requests, have your pharmacy contact our office and allow 72 hours for refills to be completed.    Today you received the following chemotherapy and/or immunotherapy agents Vidaza, return as scheduled.   To help prevent nausea and vomiting after your treatment, we encourage you to take your nausea medication as directed.  BELOW ARE SYMPTOMS THAT SHOULD BE REPORTED IMMEDIATELY: *FEVER GREATER THAN 100.4 F (38 C) OR HIGHER *CHILLS OR SWEATING *NAUSEA AND VOMITING THAT IS NOT CONTROLLED WITH YOUR NAUSEA MEDICATION *UNUSUAL SHORTNESS OF BREATH *UNUSUAL BRUISING OR BLEEDING *URINARY PROBLEMS (pain or burning when urinating, or frequent urination) *BOWEL PROBLEMS (unusual diarrhea, constipation, pain near the anus) TENDERNESS IN MOUTH AND THROAT WITH OR WITHOUT PRESENCE OF ULCERS (sore throat, sores in mouth, or a toothache) UNUSUAL RASH, SWELLING OR PAIN  UNUSUAL VAGINAL DISCHARGE OR ITCHING   Items with * indicate a potential emergency and  should be followed up as soon as possible or go to the Emergency Department if any problems should occur.  Please show the CHEMOTHERAPY ALERT CARD or IMMUNOTHERAPY ALERT CARD at check-in to the Emergency Department and triage nurse.  Should you have questions after your visit or need to cancel or reschedule your appointment, please contact [facility]  - A DEPT OF [facility] [phone]  and follow the prompts.  Office hours are 8:00 a.m. to 4:30 p.m. Monday - Friday. Please note that voicemails left after 4:00 p.m. may not be returned until the following business day.  We are closed weekends and major holidays. You have access to a nurse at all times for urgent questions. Please call the main number to the clinic [phone] and follow the prompts.  For any non-urgent questions, you may also contact your provider using MyChart. We now offer e-Visits for anyone 68 and older to request care online for non-urgent symptoms. For details visit [email or web address].   Also download the MyChart app! Go to the app store, search "MyChart", open the app, select [place], and log in with your MyChart username and password.

## 2023-06-23 ENCOUNTER — Inpatient Hospital Stay: Payer: Medicare HMO

## 2023-06-23 VITALS — BP 100/64 | HR 76 | Temp 98.6°F | Resp 18

## 2023-06-23 DIAGNOSIS — Z95828 Presence of other vascular implants and grafts: Secondary | ICD-10-CM

## 2023-06-23 DIAGNOSIS — D696 Thrombocytopenia, unspecified: Secondary | ICD-10-CM | POA: Diagnosis not present

## 2023-06-23 DIAGNOSIS — C9310 Chronic myelomonocytic leukemia not having achieved remission: Secondary | ICD-10-CM

## 2023-06-23 DIAGNOSIS — K5900 Constipation, unspecified: Secondary | ICD-10-CM | POA: Diagnosis not present

## 2023-06-23 DIAGNOSIS — R634 Abnormal weight loss: Secondary | ICD-10-CM | POA: Diagnosis not present

## 2023-06-23 DIAGNOSIS — D539 Nutritional anemia, unspecified: Secondary | ICD-10-CM | POA: Diagnosis not present

## 2023-06-23 DIAGNOSIS — R079 Chest pain, unspecified: Secondary | ICD-10-CM | POA: Diagnosis not present

## 2023-06-23 DIAGNOSIS — E538 Deficiency of other specified B group vitamins: Secondary | ICD-10-CM | POA: Diagnosis not present

## 2023-06-23 DIAGNOSIS — Z5111 Encounter for antineoplastic chemotherapy: Secondary | ICD-10-CM | POA: Diagnosis not present

## 2023-06-23 DIAGNOSIS — R161 Splenomegaly, not elsewhere classified: Secondary | ICD-10-CM | POA: Diagnosis not present

## 2023-06-23 MED ORDER — SODIUM CHLORIDE 0.9 % IV SOLN: Freq: Once | INTRAVENOUS | Status: AC

## 2023-06-23 MED ORDER — SODIUM CHLORIDE 0.9 % IV SOLN
75.0000 mg/m2 | Freq: Once | INTRAVENOUS | Status: AC
  Administered 2023-06-23: 126 mg via INTRAVENOUS
  Filled 2023-06-23: qty 12.6

## 2023-06-23 MED FILL — Azacitidine For Inj 100 MG: INTRAMUSCULAR | Qty: 12.6 | Status: AC

## 2023-06-23 NOTE — Patient Instructions (Signed)
[facility] [place] - [facility]  Discharge Instructions: Thank you for choosing [facility] to provide your oncology and hematology care.  If you have a lab appointment with the Cancer Center - please note that after April 8th, 2024, all labs will be drawn in the cancer center.  You do not have to check in or register with the main entrance as you have in the past but will complete your check-in in the cancer center.  Wear comfortable clothing and clothing appropriate for easy access to any Portacath or PICC line.   We strive to give you quality time with your provider. You may need to reschedule your appointment if you arrive late (15 or more minutes).  Arriving late affects you and other patients whose appointments are after yours.  Also, if you miss three or more appointments without notifying the office, you may be dismissed from the clinic at the provider's discretion.      For prescription refill requests, have your pharmacy contact our office and allow 72 hours for refills to be completed.    Today you received the following chemotherapy and/or immunotherapy agents Vidaza, return as scheduled.   To help prevent nausea and vomiting after your treatment, we encourage you to take your nausea medication as directed.  BELOW ARE SYMPTOMS THAT SHOULD BE REPORTED IMMEDIATELY: *FEVER GREATER THAN 100.4 F (38 C) OR HIGHER *CHILLS OR SWEATING *NAUSEA AND VOMITING THAT IS NOT CONTROLLED WITH YOUR NAUSEA MEDICATION *UNUSUAL SHORTNESS OF BREATH *UNUSUAL BRUISING OR BLEEDING *URINARY PROBLEMS (pain or burning when urinating, or frequent urination) *BOWEL PROBLEMS (unusual diarrhea, constipation, pain near the anus) TENDERNESS IN MOUTH AND THROAT WITH OR WITHOUT PRESENCE OF ULCERS (sore throat, sores in mouth, or a toothache) UNUSUAL RASH, SWELLING OR PAIN  UNUSUAL VAGINAL DISCHARGE OR ITCHING   Items with * indicate a potential emergency and  should be followed up as soon as possible or go to the Emergency Department if any problems should occur.  Please show the CHEMOTHERAPY ALERT CARD or IMMUNOTHERAPY ALERT CARD at check-in to the Emergency Department and triage nurse.  Should you have questions after your visit or need to cancel or reschedule your appointment, please contact [facility]  - A DEPT OF [facility] [phone]  and follow the prompts.  Office hours are 8:00 a.m. to 4:30 p.m. Monday - Friday. Please note that voicemails left after 4:00 p.m. may not be returned until the following business day.  We are closed weekends and major holidays. You have access to a nurse at all times for urgent questions. Please call the main number to the clinic [phone] and follow the prompts.  For any non-urgent questions, you may also contact your provider using MyChart. We now offer e-Visits for anyone 68 and older to request care online for non-urgent symptoms. For details visit [email or web address].   Also download the MyChart app! Go to the app store, search "MyChart", open the app, select [place], and log in with your MyChart username and password.

## 2023-06-23 NOTE — Progress Notes (Signed)
Patient tolerated chemotherapy with no complaints voiced. Side effects with management reviewed understanding verbalized. Port site clean and dry with no bruising or swelling noted at site. Good blood return noted before and after administration of chemotherapy.  Patient left in satisfactory condition with VSS and no s/s of distress noted. 

## 2023-06-24 ENCOUNTER — Inpatient Hospital Stay: Payer: Medicare HMO

## 2023-06-24 VITALS — BP 104/57 | HR 73 | Temp 97.6°F | Resp 18 | Wt 137.0 lb

## 2023-06-24 DIAGNOSIS — C9310 Chronic myelomonocytic leukemia not having achieved remission: Secondary | ICD-10-CM | POA: Diagnosis not present

## 2023-06-24 DIAGNOSIS — R161 Splenomegaly, not elsewhere classified: Secondary | ICD-10-CM | POA: Diagnosis not present

## 2023-06-24 DIAGNOSIS — Z5111 Encounter for antineoplastic chemotherapy: Secondary | ICD-10-CM | POA: Diagnosis not present

## 2023-06-24 DIAGNOSIS — R634 Abnormal weight loss: Secondary | ICD-10-CM | POA: Diagnosis not present

## 2023-06-24 DIAGNOSIS — Z95828 Presence of other vascular implants and grafts: Secondary | ICD-10-CM

## 2023-06-24 DIAGNOSIS — E538 Deficiency of other specified B group vitamins: Secondary | ICD-10-CM | POA: Diagnosis not present

## 2023-06-24 DIAGNOSIS — D696 Thrombocytopenia, unspecified: Secondary | ICD-10-CM | POA: Diagnosis not present

## 2023-06-24 DIAGNOSIS — K5900 Constipation, unspecified: Secondary | ICD-10-CM | POA: Diagnosis not present

## 2023-06-24 DIAGNOSIS — D539 Nutritional anemia, unspecified: Secondary | ICD-10-CM | POA: Diagnosis not present

## 2023-06-24 DIAGNOSIS — R079 Chest pain, unspecified: Secondary | ICD-10-CM | POA: Diagnosis not present

## 2023-06-24 MED ORDER — HEPARIN SOD (PORK) LOCK FLUSH 100 UNIT/ML IV SOLN
500.0000 [IU] | Freq: Once | INTRAVENOUS | Status: AC
  Administered 2023-06-24: 500 [IU] via INTRAVENOUS

## 2023-06-24 MED ORDER — SODIUM CHLORIDE 0.9 % IV SOLN: Freq: Once | INTRAVENOUS | Status: AC

## 2023-06-24 MED ORDER — SODIUM CHLORIDE 0.9% FLUSH
10.0000 mL | Freq: Once | INTRAVENOUS | Status: AC
  Administered 2023-06-24: 10 mL via INTRAVENOUS

## 2023-06-24 MED ORDER — PALONOSETRON HCL INJECTION 0.25 MG/5ML
0.2500 mg | Freq: Once | INTRAVENOUS | Status: AC
  Administered 2023-06-24: 0.25 mg via INTRAVENOUS
  Filled 2023-06-24: qty 5

## 2023-06-24 MED ORDER — SODIUM CHLORIDE 0.9 % IV SOLN
75.0000 mg/m2 | Freq: Once | INTRAVENOUS | Status: AC
  Administered 2023-06-24: 126 mg via INTRAVENOUS
  Filled 2023-06-24: qty 12.6

## 2023-06-24 NOTE — Patient Instructions (Signed)
 [facility] [place] [facility]  Discharge Instructions: Thank you for choosing [facility] to provide your oncology and hematology care.  If you have a lab appointment with the Cancer Center - please note that after April 8th, 2024, all labs will be drawn in the cancer center.  You do not have to check in or register with the main entrance as you have in the past but will complete your check-in in the cancer center.  Wear comfortable clothing and clothing appropriate for easy access to any Portacath or PICC line.   We strive to give you quality time with your provider. You may need to reschedule your appointment if you arrive late (15 or more minutes).  Arriving late affects you and other patients whose appointments are after yours.  Also, if you miss three or more appointments without notifying the office, you may be dismissed from the clinic at the provider's discretion.      For prescription refill requests, have your pharmacy contact our office and allow 72 hours for refills to be completed.    Today you received the following chemotherapy and/or immunotherapy agents Vidaza      To help prevent nausea and vomiting after your treatment, we encourage you to take your nausea medication as directed.  BELOW ARE SYMPTOMS THAT SHOULD BE REPORTED IMMEDIATELY: *FEVER GREATER THAN 100.4 F (38 C) OR HIGHER *CHILLS OR SWEATING *NAUSEA AND VOMITING THAT IS NOT CONTROLLED WITH YOUR NAUSEA MEDICATION *UNUSUAL SHORTNESS OF BREATH *UNUSUAL BRUISING OR BLEEDING *URINARY PROBLEMS (pain or burning when urinating, or frequent urination) *BOWEL PROBLEMS (unusual diarrhea, constipation, pain near the anus) TENDERNESS IN MOUTH AND THROAT WITH OR WITHOUT PRESENCE OF ULCERS (sore throat, sores in mouth, or a toothache) UNUSUAL RASH, SWELLING OR PAIN  UNUSUAL VAGINAL DISCHARGE OR ITCHING   Items with * indicate a potential emergency and should be followed up as  soon as possible or go to the Emergency Department if any problems should occur.  Please show the CHEMOTHERAPY ALERT CARD or IMMUNOTHERAPY ALERT CARD at check-in to the Emergency Department and triage nurse.  Should you have questions after your visit or need to cancel or reschedule your appointment, please contact [facility] [place] - A DEPT OF Eligha Bridegroom [facility] [phone]  and follow the prompts.  Office hours are 8:00 a.m. to 4:30 p.m. Monday - Friday. Please note that voicemails left after 4:00 p.m. may not be returned until the following business day.  We are closed weekends and major holidays. You have access to a nurse at all times for urgent questions. Please call the main number to the clinic [phone] and follow the prompts.  For any non-urgent questions, you may also contact your provider using MyChart. We now offer e-Visits for anyone 72 and older to request care online for non-urgent symptoms. For details visit [email or web address].   Also download the MyChart app! Go to the app store, search "MyChart", open the app, select [place], and log in with your MyChart username and password.

## 2023-06-24 NOTE — Progress Notes (Signed)
Patient presents today for Vidaza infusion per providers order.  Vital signs within parameters for treatment.  Patient has no new complaints at this time.  Treatment given today per MD orders.  Stable during infusion without adverse affects.  Vital signs stable.  No complaints at this time.  Discharge from clinic ambulatory in stable condition.  Alert and oriented X 3.  Follow up with [facility] as scheduled.

## 2023-06-27 ENCOUNTER — Inpatient Hospital Stay: Payer: Medicare HMO

## 2023-06-27 ENCOUNTER — Telehealth: Payer: Self-pay | Admitting: Allergy

## 2023-06-27 MED ORDER — TEZSPIRE 210 MG/1.91ML ~~LOC~~ SOAJ: 210.0000 mg | SUBCUTANEOUS | 11 refills | Status: DC

## 2023-06-27 NOTE — Telephone Encounter (Signed)
Darlena called from [employer] [employer] called and stated they need a refill on  Tezspire Tezepelumab-ekko (TEZSPIRE) 210 MG/1. SOAJ and requested a call back at [phone] Option 2 and then Option 3.

## 2023-06-28 ENCOUNTER — Inpatient Hospital Stay: Payer: Medicare HMO

## 2023-06-28 NOTE — Telephone Encounter (Signed)
Rx sent 

## 2023-06-29 ENCOUNTER — Inpatient Hospital Stay: Payer: Medicare HMO

## 2023-06-30 ENCOUNTER — Ambulatory Visit: Payer: Medicare HMO

## 2023-07-01 ENCOUNTER — Inpatient Hospital Stay: Payer: Medicare HMO

## 2023-07-04 ENCOUNTER — Ambulatory Visit: Payer: Medicare HMO

## 2023-07-06 ENCOUNTER — Ambulatory Visit (INDEPENDENT_AMBULATORY_CARE_PROVIDER_SITE_OTHER): Payer: Medicare HMO

## 2023-07-06 DIAGNOSIS — J4550 Severe persistent asthma, uncomplicated: Secondary | ICD-10-CM

## 2023-07-08 DIAGNOSIS — C9200 Acute myeloblastic leukemia, not having achieved remission: Secondary | ICD-10-CM | POA: Diagnosis not present

## 2023-07-12 ENCOUNTER — Other Ambulatory Visit: Payer: Self-pay | Admitting: Allergy

## 2023-07-14 ENCOUNTER — Other Ambulatory Visit: Payer: Self-pay | Admitting: Hematology

## 2023-07-14 ENCOUNTER — Other Ambulatory Visit: Payer: Self-pay | Admitting: Allergy

## 2023-07-14 DIAGNOSIS — G4700 Insomnia, unspecified: Secondary | ICD-10-CM

## 2023-07-18 ENCOUNTER — Encounter: Payer: Self-pay | Admitting: Hematology

## 2023-07-25 ENCOUNTER — Inpatient Hospital Stay (HOSPITAL_BASED_OUTPATIENT_CLINIC_OR_DEPARTMENT_OTHER): Payer: Medicare HMO | Admitting: Hematology

## 2023-07-25 ENCOUNTER — Inpatient Hospital Stay: Payer: Medicare HMO

## 2023-07-25 ENCOUNTER — Inpatient Hospital Stay: Payer: Medicare HMO | Attending: Hematology

## 2023-07-25 ENCOUNTER — Encounter: Payer: Self-pay | Admitting: Hematology

## 2023-07-25 ENCOUNTER — Inpatient Hospital Stay: Payer: Medicare HMO | Admitting: Hematology

## 2023-07-25 VITALS — BP 92/61 | HR 75 | Temp 96.6°F | Resp 20 | Wt 131.4 lb

## 2023-07-25 DIAGNOSIS — Z882 Allergy status to sulfonamides status: Secondary | ICD-10-CM | POA: Insufficient documentation

## 2023-07-25 DIAGNOSIS — Z833 Family history of diabetes mellitus: Secondary | ICD-10-CM | POA: Diagnosis not present

## 2023-07-25 DIAGNOSIS — R300 Dysuria: Secondary | ICD-10-CM

## 2023-07-25 DIAGNOSIS — N946 Dysmenorrhea, unspecified: Secondary | ICD-10-CM | POA: Diagnosis not present

## 2023-07-25 DIAGNOSIS — Z800 Family history of malignant neoplasm of digestive organs: Secondary | ICD-10-CM | POA: Insufficient documentation

## 2023-07-25 DIAGNOSIS — Z83518 Family history of other specified eye disorder: Secondary | ICD-10-CM | POA: Diagnosis not present

## 2023-07-25 DIAGNOSIS — K5900 Constipation, unspecified: Secondary | ICD-10-CM | POA: Diagnosis not present

## 2023-07-25 DIAGNOSIS — Z881 Allergy status to other antibiotic agents status: Secondary | ICD-10-CM | POA: Diagnosis not present

## 2023-07-25 DIAGNOSIS — Z95828 Presence of other vascular implants and grafts: Secondary | ICD-10-CM

## 2023-07-25 DIAGNOSIS — F419 Anxiety disorder, unspecified: Secondary | ICD-10-CM | POA: Insufficient documentation

## 2023-07-25 DIAGNOSIS — D539 Nutritional anemia, unspecified: Secondary | ICD-10-CM | POA: Insufficient documentation

## 2023-07-25 DIAGNOSIS — Z9089 Acquired absence of other organs: Secondary | ICD-10-CM | POA: Insufficient documentation

## 2023-07-25 DIAGNOSIS — Z9851 Tubal ligation status: Secondary | ICD-10-CM | POA: Diagnosis not present

## 2023-07-25 DIAGNOSIS — C9310 Chronic myelomonocytic leukemia not having achieved remission: Secondary | ICD-10-CM | POA: Insufficient documentation

## 2023-07-25 DIAGNOSIS — Z5111 Encounter for antineoplastic chemotherapy: Secondary | ICD-10-CM | POA: Insufficient documentation

## 2023-07-25 DIAGNOSIS — Z79899 Other long term (current) drug therapy: Secondary | ICD-10-CM | POA: Diagnosis not present

## 2023-07-25 DIAGNOSIS — D696 Thrombocytopenia, unspecified: Secondary | ICD-10-CM | POA: Insufficient documentation

## 2023-07-25 DIAGNOSIS — E538 Deficiency of other specified B group vitamins: Secondary | ICD-10-CM | POA: Insufficient documentation

## 2023-07-25 DIAGNOSIS — R634 Abnormal weight loss: Secondary | ICD-10-CM | POA: Diagnosis not present

## 2023-07-25 DIAGNOSIS — Z8249 Family history of ischemic heart disease and other diseases of the circulatory system: Secondary | ICD-10-CM | POA: Insufficient documentation

## 2023-07-25 DIAGNOSIS — K219 Gastro-esophageal reflux disease without esophagitis: Secondary | ICD-10-CM | POA: Insufficient documentation

## 2023-07-25 DIAGNOSIS — Z825 Family history of asthma and other chronic lower respiratory diseases: Secondary | ICD-10-CM | POA: Insufficient documentation

## 2023-07-25 DIAGNOSIS — Z807 Family history of other malignant neoplasms of lymphoid, hematopoietic and related tissues: Secondary | ICD-10-CM | POA: Insufficient documentation

## 2023-07-25 LAB — URINALYSIS, ROUTINE W REFLEX MICROSCOPIC
Bilirubin Urine: NEGATIVE
Glucose, UA: NEGATIVE mg/dL
Hgb urine dipstick: NEGATIVE
Ketones, ur: NEGATIVE mg/dL
Nitrite: NEGATIVE
Protein, ur: NEGATIVE mg/dL
Specific Gravity, Urine: 1.017 (ref 1.005–1.030)
WBC, UA: >50 WBC/hpf (ref 0–5)
pH: 8 (ref 5.0–8.0)

## 2023-07-25 LAB — COMPREHENSIVE METABOLIC PANEL
ALT: 12 U/L (ref 0–44)
AST: 28 U/L (ref 15–41)
Albumin: 4.1 g/dL (ref 3.5–5.0)
Alkaline Phosphatase: 61 U/L (ref 38–126)
Anion gap: 4 — ABNORMAL LOW (ref 5–15)
BUN: 15 mg/dL (ref 8–23)
CO2: 28 mmol/L (ref 22–32)
Calcium: 9 mg/dL (ref 8.9–10.3)
Chloride: 104 mmol/L (ref 98–111)
Creatinine, Ser: 0.82 mg/dL (ref 0.44–1.00)
GFR, Estimated: >60 mL/min (ref >60)
Glucose, Bld: 90 mg/dL (ref 70–99)
Potassium: 4.3 mmol/L (ref 3.5–5.1)
Sodium: 136 mmol/L (ref 135–145)
Total Bilirubin: 0.9 mg/dL (ref 0.0–1.2)
Total Protein: 6.8 g/dL (ref 6.5–8.1)

## 2023-07-25 LAB — CBC WITH DIFFERENTIAL/PLATELET
Abs Immature Granulocytes: 0.3 10*3/uL — ABNORMAL HIGH (ref 0.00–0.07)
Band Neutrophils: 4 %
Basophils Absolute: 0 10*3/uL (ref 0.0–0.1)
Basophils Relative: 0 %
Eosinophils Absolute: 0 10*3/uL (ref 0.0–0.5)
Eosinophils Relative: 0 %
HCT: 27.4 % — ABNORMAL LOW (ref 36.0–46.0)
Hemoglobin: 8.2 g/dL — ABNORMAL LOW (ref 12.0–15.0)
Lymphocytes Relative: 26 %
Lymphs Abs: 1.5 10*3/uL (ref 0.7–4.0)
MCH: 29.1 pg (ref 26.0–34.0)
MCHC: 29.9 g/dL — ABNORMAL LOW (ref 30.0–36.0)
MCV: 97.2 fL (ref 80.0–100.0)
Metamyelocytes Relative: 2 %
Monocytes Absolute: 0.3 10*3/uL (ref 0.1–1.0)
Monocytes Relative: 5 %
Myelocytes: 3 %
Neutro Abs: 3.8 10*3/uL (ref 1.7–7.7)
Neutrophils Relative %: 60 %
Platelets: 129 10*3/uL — ABNORMAL LOW (ref 150–400)
RBC: 2.82 MIL/uL — ABNORMAL LOW (ref 3.87–5.11)
RDW: 20 % — ABNORMAL HIGH (ref 11.5–15.5)
WBC: 5.9 10*3/uL (ref 4.0–10.5)
nRBC: 1.9 % — ABNORMAL HIGH (ref 0.0–0.2)
nRBC: 2 /100{WBCs} — ABNORMAL HIGH

## 2023-07-25 LAB — MAGNESIUM: Magnesium: 2.1 mg/dL (ref 1.7–2.4)

## 2023-07-25 LAB — LACTATE DEHYDROGENASE: LDH: 782 U/L — ABNORMAL HIGH (ref 98–192)

## 2023-07-25 MED ORDER — SODIUM CHLORIDE 0.9 % IV SOLN
75.0000 mg/m2 | Freq: Once | INTRAVENOUS | Status: AC
  Administered 2023-07-25: 126 mg via INTRAVENOUS
  Filled 2023-07-25: qty 12.6

## 2023-07-25 MED ORDER — SODIUM CHLORIDE 0.9% FLUSH
10.0000 mL | Freq: Once | INTRAVENOUS | Status: AC
  Administered 2023-07-25: 10 mL via INTRAVENOUS

## 2023-07-25 MED ORDER — HEPARIN SOD (PORK) LOCK FLUSH 100 UNIT/ML IV SOLN
500.0000 [IU] | Freq: Once | INTRAVENOUS | Status: AC
  Administered 2023-07-25: 500 [IU] via INTRAVENOUS

## 2023-07-25 MED ORDER — PALONOSETRON HCL INJECTION 0.25 MG/5ML
0.2500 mg | Freq: Once | INTRAVENOUS | Status: AC
  Administered 2023-07-25: 0.25 mg via INTRAVENOUS
  Filled 2023-07-25: qty 5

## 2023-07-25 MED ORDER — SODIUM CHLORIDE 0.9 % IV SOLN: Freq: Once | INTRAVENOUS | Status: AC

## 2023-07-25 NOTE — Patient Instructions (Signed)
 [facility] [place] [facility]  Discharge Instructions: Thank you for choosing [facility] to provide your oncology and hematology care.  If you have a lab appointment with the Cancer Center - please note that after April 8th, 2024, all labs will be drawn in the cancer center.  You do not have to check in or register with the main entrance as you have in the past but will complete your check-in in the cancer center.  Wear comfortable clothing and clothing appropriate for easy access to any Portacath or PICC line.   We strive to give you quality time with your provider. You may need to reschedule your appointment if you arrive late (15 or more minutes).  Arriving late affects you and other patients whose appointments are after yours.  Also, if you miss three or more appointments without notifying the office, you may be dismissed from the clinic at the provider's discretion.      For prescription refill requests, have your pharmacy contact our office and allow 72 hours for refills to be completed.    Today you received the following chemotherapy and/or immunotherapy agents Vidaza.  Azacitidine Injection What is this medication? AZACITIDINE (ay za SITE i deen) treats blood and bone marrow cancers. It works by slowing down the growth of cancer cells. This medicine may be used for other purposes; ask your health care provider or pharmacist if you have questions. COMMON BRAND NAME(S): Vidaza What should I tell my care team before I take this medication? They need to know if you have any of these conditions: Kidney disease Liver disease Low blood cell levels, such as low white cells, platelets, or red blood cells Low levels of albumin in the blood Low levels of bicarbonate in the blood An unusual or allergic reaction to azacitidine, mannitol, other medications, foods, dyes, or preservatives If you or your partner are pregnant or trying to get  pregnant Breast-feeding How should I use this medication? This medication is injected into a vein or under the skin. It is given by your care team in a hospital or clinic setting. Talk to your care team about the use of this medication in children. While it may be prescribed for children as young as 1 month for selected conditions, precautions do apply. Overdosage: If you think you have taken too much of this medicine contact a poison control center or emergency room at once. NOTE: This medicine is only for you. Do not share this medicine with others. What if I miss a dose? Keep appointments for follow-up doses. It is important not to miss your dose. Call your care team if you are unable to keep an appointment. What may interact with this medication? Interactions are not expected. This list may not describe all possible interactions. Give your health care provider a list of all the medicines, herbs, non-prescription drugs, or dietary supplements you use. Also tell them if you smoke, drink alcohol, or use illegal drugs. Some items may interact with your medicine. What should I watch for while using this medication? Your condition will be monitored carefully while you are receiving this medication. You may need blood work while taking this medication. This medication may make you feel generally unwell. This is not uncommon as chemotherapy can affect healthy cells as well as cancer cells. Report any side effects. Continue your course of treatment even though you feel ill unless your care team tells you to stop.  Other product types may be available that contain the medication azacitidine. The injection and oral products should not be used in place of one another. Talk to your care team if you have questions. This medication can cause serious side effects. To reduce the risk, your care team may give you other medications to take before receiving this one. Be sure to follow the directions from your care  team. This medication may increase your risk of getting an infection. Call your care team for advice if you get a fever, chills, sore throat, or other symptoms of a cold or flu. Do not treat yourself. Try to avoid being around people who are sick. Avoid taking medications that contain aspirin, acetaminophen, ibuprofen, naproxen, or ketoprofen unless instructed by your care team. These medications may hide a fever. This medication may increase your risk to bruise or bleed. Call your care team if you notice any unusual bleeding. Be careful brushing or flossing your teeth or using a toothpick because you may get an infection or bleed more easily. If you have any dental work done, tell your dentist you are receiving this medication. Talk to your care team if you or your partner may be pregnant. Serious birth defects can occur if you take this medication during pregnancy and for 6 months after the last dose. You will need a negative pregnancy test before starting this medication. Contraception is recommended while taking his medication and for 6 months after the last dose. Your care team can help you find the option that works for you. If your partner can get pregnant, use a condom during sex while taking this medication and for 3 months after the last dose. Do not breastfeed while taking this medication and for 1 week after the last dose. This medication may cause infertility. Talk to your care team if you are concerned about your fertility. What side effects may I notice from receiving this medication? Side effects that you should report to your care team as soon as possible: Allergic reactions--skin rash, itching, hives, swelling of the face, lips, tongue, or throat Infection--fever, chills, cough, sore throat, wounds that don't heal, pain or trouble when passing urine, general feeling of discomfort or being unwell Kidney injury--decrease in the amount of urine, swelling of the ankles, hands, or  feet Liver injury--right upper belly pain, loss of appetite, nausea, light-colored stool, dark yellow or Jairy Angulo urine, yellowing skin or eyes, unusual weakness or fatigue Low red blood cell level--unusual weakness or fatigue, dizziness, headache, trouble breathing Tumor lysis syndrome (TLS)--nausea, vomiting, diarrhea, decrease in the amount of urine, dark urine, unusual weakness or fatigue, confusion, muscle pain or cramps, fast or irregular heartbeat, joint pain Unusual bruising or bleeding Side effects that usually do not require medical attention (report to your care team if they continue or are bothersome): Constipation Diarrhea Nausea Pain, redness, or irritation at injection site Vomiting This list may not describe all possible side effects. Call your doctor for medical advice about side effects. You may report side effects to FDA at 1-800-FDA-1088. Where should I keep my medication? This medication is given in a hospital or clinic. It will not be stored at home. NOTE: This sheet is a summary. It may not cover all possible information. If you have questions about this medicine, talk to your doctor, pharmacist, or health care provider.  2024 Elsevier/Gold Standard (2021-11-19 00:00:00)        To help prevent nausea and vomiting after your treatment, we encourage you to take  your nausea medication as directed.  BELOW ARE SYMPTOMS THAT SHOULD BE REPORTED IMMEDIATELY: *FEVER GREATER THAN 100.4 F (38 C) OR HIGHER *CHILLS OR SWEATING *NAUSEA AND VOMITING THAT IS NOT CONTROLLED WITH YOUR NAUSEA MEDICATION *UNUSUAL SHORTNESS OF BREATH *UNUSUAL BRUISING OR BLEEDING *URINARY PROBLEMS (pain or burning when urinating, or frequent urination) *BOWEL PROBLEMS (unusual diarrhea, constipation, pain near the anus) TENDERNESS IN MOUTH AND THROAT WITH OR WITHOUT PRESENCE OF ULCERS (sore throat, sores in mouth, or a toothache) UNUSUAL RASH, SWELLING OR PAIN  UNUSUAL VAGINAL DISCHARGE OR ITCHING    Items with * indicate a potential emergency and should be followed up as soon as possible or go to the Emergency Department if any problems should occur.  Please show the CHEMOTHERAPY ALERT CARD or IMMUNOTHERAPY ALERT CARD at check-in to the Emergency Department and triage nurse.  Should you have questions after your visit or need to cancel or reschedule your appointment, please contact [facility] [place] - A DEPT OF Eligha Bridegroom [facility] [phone]  and follow the prompts.  Office hours are 8:00 a.m. to 4:30 p.m. Monday - Friday. Please note that voicemails left after 4:00 p.m. may not be returned until the following business day.  We are closed weekends and major holidays. You have access to a nurse at all times for urgent questions. Please call the main number to the clinic [phone] and follow the prompts.  For any non-urgent questions, you may also contact your provider using MyChart. We now offer e-Visits for anyone 44 and older to request care online for non-urgent symptoms. For details visit [email or web address].   Also download the MyChart app! Go to the app store, search "MyChart", open the app, select [place], and log in with your MyChart username and password.

## 2023-07-25 NOTE — Progress Notes (Signed)
 Patient presents today for chemotherapy infusion. Patient is in satisfactory condition with no new complaints voiced.  Vital signs are stable.  Labs reviewed by Dr. Rogers during the office visit and all labs are within treatment parameters.  Urinalysis collected due to dysuria.  We will proceed with treatment per MD orders.   Patient tolerated treatment well with no complaints voiced.  UA resulted.  Treatment is pending urine culture results per Dr. Katragadda.  Patient left ambulatory in stable condition.  Vital signs stable at discharge.  Follow up as scheduled.

## 2023-07-25 NOTE — Progress Notes (Signed)
 Patient here today for port flush/labs for treatment.  She tolerated without incidence.  Patient was sent to waiting room to self to await provider visit.

## 2023-07-25 NOTE — Progress Notes (Signed)
 [facility] [address] [name]    Clinic Day:  07/28/2023  Referring physician: Sheryle Carwin, MD  Patient Care Team: Sheryle Carwin, MD as PCP - General (Internal Medicine) [name], MD as Attending Physician (Gastroenterology) [name], MD as Consulting Physician (Pulmonary Disease) [name], MD as Medical Oncologist (Hematology)   ASSESSMENT & PLAN:   Assessment: 1.  Macrocytic anemia and thrombocytopenia: - Patient seen at the request of Dr. Sheryle for abnormal CBC. - 04/17/2022: WBC 15.8, Hb-9.7, PLT-128 - 03/24/2022: WBC-8.1 (N-43%, L-17%, M-19%, B-2%), Hb-9.6, MCV-99, PLT-115 - Smear review: 13% metamyelocytes, 2% myelocytes, 2% blasts,  - 10/19/2021: WBC-5.3 (N-61, L-21, M-10%), Hb-11, MCV-96, PLT-97 - BMBX (05/25/2022): Hypercellular bone marrow with myeloid hyperplasia with dysgranulopoiesis, erythroid hypoplasia and megakaryocytic hyperplasia with dyspoiesis.  Chromosome analysis was 95, XX.  Bone marrow blasts less than 5%.  Peripheral blood blasts less than 2%. - Mayo molecular model risk stratification: Intermediate 2 risk with at least 2 points (decreased hemoglobin less than 10, circulating immature cells).  Intermediate 2 risk with median OS 31 months. - Serum EPO level 41. - NGS: Positive for CBL, MPL, SRSF2, TET 2, RAD21 - PET scan (07/08/2022): Splenomegaly (volume 510 mm) with normal metabolic activity.  Uniform increase in marrow metabolic activity related to patient's anemia.  No lymphadenopathy. - BMBX (07/16/2022): Hypercellular bone marrow (95-100%) with myeloid hyperplasia, atypical monocytes, increased blasts (16% overall).  Atypical monocytosis present 23% of total events, expressing HLA-DR, CD38, CD4 dim, CD11C, CD13, CD14, CD36, CD64 with aberrant coexpression of CD56 and CD7. - Cycle 1 of azacitidine  on 08/02/2022, cycle 2 on 08/30/2022.  Azacitidine  decreased to 5 days starting cycle 5 on 11/22/2022 - BMBX  (09/21/2022): 3% blasts, hypercellular marrow with markedly increased dyspoietic megakaryocytes, increased fibrosis - BMBX (11/16/2022): Hypercellular marrow (70-80%) with marked GIST [name] (, myelofibrosis and 3% blasts on a very limited sample. - BMBX (05/06/2023): Hypercellular marrow with 85% cellularity with fibrosis and 4% blasts approximately. - BMBX (07/08/2023): Hypercellular marrow (95%) with increased myelopoiesis, dysmenorrhea Elenor Bare is, increased fibrosis and 5% blasts.  Cytogenetics 66, XX. - NGS (07/08/2023): MPL, SRSF2, RUNX1, TET2   2.  Social/family history: - She lives at home by herself.  Son lives next door. - Does part-time work at [employer].  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  Non-smoker. - Maternal aunt had tumor in the breast, patient not certain if it is cancer.  2 maternal first cousins had lymphoma.  Mother had stomach cancer.    Plan: 1.  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 - We discussed bone marrow biopsy results from 07/08/2023 which showed slightly increase in blasts and increased fibrosis. - I discussed with Dr. [name].  Agree with increasing azacitidine  to 7 days every 28 days for 2 more cycles and repeat bone marrow biopsy. - We reviewed labs today: Platelet count 129 and hemoglobin 8.2.  White count is normal.  LFTs are normal. - Proceed with cycle 12 of azacitidine  75 mg/m x 7 days. - She will come back on 08/22/2023 for cycle 13.  She is reportedly scheduled for bone marrow biopsy on 09/02/2023.  We will request move it to the following week. - She reports occasional cloudy urine.  Will check UA and culture to see if she requires any antibiotic.    2.  Folate deficiency: -  Continue folic acid  tablet daily.   3.  Weight loss: - She is eating well and does not require Megace .   4.  Constipation: - Continue Colace daily and  lactulose  as needed.  This is well-controlled.    Orders Placed This Encounter  Procedures   Urine culture   Comprehensive metabolic panel    Standing Status:   Future    Expected Date:   08/22/2023    Expiration Date:   08/21/2024   Magnesium    Standing Status:   Future    Expected Date:   08/22/2023    Expiration Date:   08/21/2024   Lactate dehydrogenase    Standing Status:   Future    Expected Date:   08/22/2023    Expiration Date:   08/21/2024   CMP (Cancer Center only)    Standing Status:   Future    Expected Date:   08/22/2023    Expiration Date:   08/21/2024   CBC with Differential    Standing Status:   Future    Expected Date:   08/22/2023    Expiration Date:   08/21/2024   Urinalysis, Routine w reflex microscopic      I,Katie Daubenspeck,acting as a scribe for [name], MD.,have documented all relevant documentation on the behalf of [name], MD,as directed by  [name], MD while in the presence of [name], MD.   I, [name] MD, have reviewed the above documentation for accuracy and completeness, and I agree with the above.   [name], MD   1/9/20256:56 PM  CHIEF COMPLAINT:   Diagnosis: CMML-2    Cancer Staging  No matching staging information was found for the patient.    Prior Therapy: none  Current Therapy:  Azacitidine  70 mg/m x 7 days every 28 days    HISTORY OF PRESENT ILLNESS:   Oncology History  CMML (chronic myelomonocytic leukemia) (HCC)  06/03/2022 Initial Diagnosis   CMML (chronic myelomonocytic leukemia) (HCC)   08/02/2022 -  Chemotherapy   Patient is on Treatment Plan : MYELODYSPLASIA  Azacitidine  IVPB D1-7 q28d        INTERVAL HISTORY:   Illiana is a 73 y.o. female presenting to clinic today for follow up of CMML-2. She was last seen by me on 06/21/23.  Since her last visit, she had follow up and repeat bone marrow biopsy with Dr. [name] on 07/08/23. Pathology results showed a  slight increase in blasts with focal small clusters of blasts.  Today, she states that she is doing well overall. Her appetite level is at 70%. Her energy level is at 80%.  PAST MEDICAL HISTORY:   Past Medical History: Past Medical History:  Diagnosis Date   Anxiety    Asthma    had 1 episode 1 year ago-no more problem   CHF (congestive heart failure) (HCC)    swelling of feet & ankles   CMML (chronic myelomonocytic leukemia) (HCC) 06/03/2022   Cough    Depression    OCD   GERD (gastroesophageal reflux disease)    severe   OCD (obsessive compulsive disorder)    Port-A-Cath in place 07/26/2022   Shortness of breath    with exertion   Yeast infection 04/09/2014    Surgical History: Past Surgical History:  Procedure Laterality Date   CARPAL TUNNEL RELEASE  03/22/2012   Procedure: CARPAL TUNNEL RELEASE;  Surgeon: Arley JONELLE Curia, MD;  Location: [facility];  Service: Orthopedics;  Laterality: Right;  right  carpal tunnel release   COLONOSCOPY  06/19/2012   Dr. Rourk:normal rectum and colon    ESOPHAGOGASTRODUODENOSCOPY N/A 12/26/2015   Dr. Shaaron: normal    IR IMAGING GUIDED PORT INSERTION  07/30/2022   TONSILLECTOMY     TUBAL LIGATION      Social History: Social History   Socioeconomic History   Marital status: Widowed    Spouse name: Not on file   Number of children: 1   Years of education: Not on file   Highest education level: Not on file  Occupational History   Occupation: retired  Tobacco Use   Smoking status: Never    Passive exposure: Yes   Smokeless tobacco: Never  Vaping Use   Vaping status: Never Used  Substance and Sexual Activity   Alcohol use: No   Drug use: No   Sexual activity: Yes    Birth control/protection: Surgical    Comment: tubal  Other Topics Concern   Not on file  Social History Narrative   Not on file   Social Drivers of Health   Financial Resource Strain: Not on file  Food Insecurity: Not on file  Transportation Needs:  Not on file  Physical Activity: Not on file  Stress: Not on file  Social Connections: Not on file  Intimate Partner Violence: Not on file    Family History: Family History  Problem Relation Age of Onset   Asthma Mother    Macular degeneration Mother    Hypertension Mother    Stomach cancer Mother 74   Alzheimer's disease Father    Other Sister        blood clots   Diabetes Maternal Grandfather    Diabetes Paternal Grandfather    Other Son        enlarged spleen   Lymphoma Cousin        x2, both dx in their 18s   Colon cancer Neg Hx     Current Medications:  Current Outpatient Medications:    albuterol  (VENTOLIN  HFA) 108 (90 Base) MCG/ACT inhaler, INHALE 2 PUFFS BY MOUTH EVERY 6 HOURS AS NEEDED FOR SHORTNESS OF BREATH OR WHEEZING., Disp: 18 g, Rfl: 2   albuterol  (VENTOLIN  HFA) 108 (90 Base) MCG/ACT inhaler, INHALE TWO PUFFS EVERY 4 HOURS AS NEEDED FOR WHEEZING OR FOR SHORTNESS OF BREATH, Disp: 18 g, Rfl: 1   ARNUITY ELLIPTA  200 MCG/ACT AEPB, Inhale 1 puff into the lungs daily., Disp: 30 each, Rfl: 5   azaCITIDine  5 mg/2 mLs in lactated ringers  infusion, Inject into the vein daily. Days 1-7 every 28 days, Disp: , Rfl:    azelastine  (ASTELIN ) 0.1 % nasal spray, SPRAY 1 TO 2 SPRAYS PER NOSTRIL TWICE DAILY., Disp: 30 mL, Rfl: 3   benzonatate  (TESSALON ) 200 MG capsule, Take 200 mg by mouth 3 (three) times daily as needed., Disp: , Rfl:    BREZTRI  AEROSPHERE 160-9-4.8 MCG/ACT AERO, Inhale 2 puffs into the lungs 2 (two) times daily., Disp: 10.7 g, Rfl: 5   cetirizine  (ZYRTEC ) 5 MG tablet, Take 1 tablet (5 mg total) by mouth daily., Disp: 30 tablet, Rfl: 0   ciclopirox  (PENLAC ) 8 % solution, APPLY TOPICALLY AT BEDTIME. APPLY OVER NAIL FOLD AND SURROUNDING SKIN. APPLY DAILY OVER PREVIOUS COAT. AFTER SEVEN DAYS, MAY REMOVE WITH ALCOHOL AND CONTINUE CYCLE., Disp: 6.6 mL, Rfl: 2   EPINEPHrine  0.3 mg/0.3 mL IJ SOAJ injection, Inject 0.3 mg into the muscle as needed for anaphylaxis., Disp: 2  each, Rfl: 1   famotidine  (PEPCID ) 20  MG tablet, Take 1 tablet (20 mg total) by mouth 2 (two) times daily., Disp: 60 tablet, Rfl: 5   FLUoxetine (PROZAC) 20 MG capsule, Take 1 capsule by mouth daily., Disp: , Rfl:    fluticasone  (FLONASE ) 50 MCG/ACT nasal spray, Place 2 sprays into both nostrils 2 (two) times daily., Disp: 16 g, Rfl: 5   fluticasone  (FLOVENT  HFA) 110 MCG/ACT inhaler, Inhale two puffs twice daily to prevent cough or wheeze. Rinse mouth after use., Disp: 12 g, Rfl: 5   folic acid  (FOLVITE ) 1 MG tablet, TAKE ONE TABLET BY MOUTH ONCE DAILY, Disp: 30 tablet, Rfl: 5   gabapentin (NEURONTIN) 300 MG capsule, , Disp: , Rfl:    HYDROcodone  bit-homatropine (HYCODAN) 5-1.5 MG/5ML syrup, , Disp: , Rfl:    ipratropium (ATROVENT ) 0.06 % nasal spray, USE 2 SPRAYS IN EACH NOSTRIL THREE TIMES A DAY AS NEEDED., Disp: 15 mL, Rfl: 5   lactulose  (CHRONULAC ) 10 GM/15ML solution, take 30ml BY MOUTH EVERY THREE HOURS UNTIL bowel movement; THEN take 30ml ONCE DAILY AS NEEDED FOR CONSTIPATION, Disp: 450 mL, Rfl: 2   lidocaine -prilocaine  (EMLA ) cream, Apply a quarter sized amount to port a cath site and cover with plastic wrap one hour prior to infusion appointments, Disp: 30 g, Rfl: 3   megestrol  (MEGACE ) 400 MG/10ML suspension, Take 10 mLs (400 mg total) by mouth 2 (two) times daily., Disp: 480 mL, Rfl: 2   montelukast  (SINGULAIR ) 10 MG tablet, TAKE ONE TABLET BY MOUTH AT BEDTIME, Disp: 30 tablet, Rfl: 0   NON FORMULARY, [facility]  Antifungal (nail)-#1, Disp: , Rfl:    pantoprazole  (PROTONIX ) 40 MG tablet, Take 1 tablet (40 mg total) by mouth daily., Disp: 30 tablet, Rfl: 5   prochlorperazine  (COMPAZINE ) 10 MG tablet, Take 1 tablet (10 mg total) by mouth every 6 (six) hours as needed for nausea or vomiting., Disp: 30 tablet, Rfl: 0   Respiratory Therapy Supplies (FLUTTER) DEVI, Use as directed, Disp: 1 each, Rfl: 3   SF 5000 PLUS 1.1 % CREA dental cream, , Disp: , Rfl:    Spacer/Aero-Holding  Chambers (AEROCHAMBER PLUS WITH MASK) inhaler, 1 each by Other route See admin instructions. Use with inhaler as instructed., Disp: 1 each, Rfl: 1   Tezepelumab -ekko (TEZSPIRE ) 210 MG/1. SOAJ, Inject 210 mg into the skin every 28 (twenty-eight) days., Disp: 1.91 mL, Rfl: 11   tiZANidine  (ZANAFLEX ) 2 MG tablet, Take 1 tablet (2 mg total) by mouth 2 (two) times daily as needed for muscle spasms., Disp: 15 tablet, Rfl: 0   traZODone  (DESYREL ) 50 MG tablet, TAKE ONE TABLET BY MOUTH AT BEDTIME, Disp: 30 tablet, Rfl: 3   ciprofloxacin  (CIPRO ) 500 MG tablet, Take 1 tablet (500 mg total) by mouth 2 (two) times daily for 7 days., Disp: 14 tablet, Rfl: 0  Current Facility-Administered Medications:    tezepelumab -ekko (TEZSPIRE ) 210 MG/1. syringe 210 mg, 210 mg, Subcutaneous, Q28 days, Jeneal Danita Macintosh, MD, 210 mg at 07/06/23 1028   Allergies: Allergies  Allergen Reactions   Levonorgestrel-Ethinyl Estrad Cough   Sulfa Antibiotics Other (See Comments)    Unknown- pt unsure of reaction; believes it may be nausea   Sulfamethoxazole-Trimethoprim Other (See Comments)   Cephalosporins Other (See Comments)    Other Reaction: Toxicity    REVIEW OF SYSTEMS:   Review of Systems  Constitutional:  Negative for chills, fatigue and fever.  HENT:   Negative for lump/mass, mouth sores, nosebleeds, sore throat and trouble swallowing.   Eyes:  Negative for eye  problems.  Respiratory:  Positive for cough and shortness of breath.   Cardiovascular:  Negative for chest pain, leg swelling and palpitations.  Gastrointestinal:  Positive for constipation. Negative for abdominal pain, diarrhea, nausea and vomiting.  Genitourinary:  Negative for bladder incontinence, difficulty urinating, dysuria, frequency, hematuria and nocturia.   Musculoskeletal:  Negative for arthralgias, back pain, flank pain, myalgias and neck pain.  Skin:  Negative for itching and rash.  Neurological:  Negative for dizziness,  headaches and numbness.  Hematological:  Does not bruise/bleed easily.  Psychiatric/Behavioral:  Negative for depression, sleep disturbance and suicidal ideas. The patient is not nervous/anxious.   All other systems reviewed and are negative.    VITALS:   There were no vitals taken for this visit.  Wt Readings from Last 3 Encounters:  07/25/23 131 lb 6.3 oz (59.6 kg)  06/24/23 137 lb (62.1 kg)  06/21/23 133 lb 3.2 oz (60.4 kg)    There is no height or weight on file to calculate BMI.  Performance status (ECOG): 1 - Symptomatic but completely ambulatory  PHYSICAL EXAM:   Physical Exam Vitals and nursing note reviewed. Exam conducted with a chaperone present.  Constitutional:      Appearance: Normal appearance.  Cardiovascular:     Rate and Rhythm: Normal rate and regular rhythm.     Pulses: Normal pulses.     Heart sounds: Normal heart sounds.  Pulmonary:     Effort: Pulmonary effort is normal.     Breath sounds: Normal breath sounds.  Abdominal:     Palpations: Abdomen is soft. There is no hepatomegaly, splenomegaly or mass.     Tenderness: There is no abdominal tenderness.  Musculoskeletal:     Right lower leg: No edema.     Left lower leg: No edema.  Lymphadenopathy:     Cervical: No cervical adenopathy.     Right cervical: No superficial, deep or posterior cervical adenopathy.    Left cervical: No superficial, deep or posterior cervical adenopathy.     Upper Body:     Right upper body: No supraclavicular or axillary adenopathy.     Left upper body: No supraclavicular or axillary adenopathy.  Neurological:     General: No focal deficit present.     Mental Status: She is alert and oriented to person, place, and time.  Psychiatric:        Mood and Affect: Mood normal.        Behavior: Behavior normal.     LABS:   CBC     Component Value Date/Time   WBC 5.9 07/25/2023 0936   RBC 2.82 (L) 07/25/2023 0936   HGB 8.2 (L) 07/25/2023 0936   HGB 13.1 07/23/2020  1112   HCT 27.4 (L) 07/25/2023 0936   HCT 40.7 07/23/2020 1112   PLT 129 (L) 07/25/2023 0936   MCV 97.2 07/25/2023 0936   MCV 95 07/23/2020 1112   MCH 29.1 07/25/2023 0936   MCHC 29.9 (L) 07/25/2023 0936   RDW 20.0 (H) 07/25/2023 0936   RDW 14.2 07/23/2020 1112   LYMPHSABS 1.5 07/25/2023 0936   LYMPHSABS 0.7 07/23/2020 1112   MONOABS 0.3 07/25/2023 0936   EOSABS 0.0 07/25/2023 0936   EOSABS 0.1 07/23/2020 1112   BASOSABS 0.0 07/25/2023 0936   BASOSABS 0.0 07/23/2020 1112    CMP      Component Value Date/Time   NA 136 07/25/2023 0936   K 4.3 07/25/2023 0936   CL 104 07/25/2023 0936   CO2 28  07/25/2023 0936   GLUCOSE 90 07/25/2023 0936   BUN 15 07/25/2023 0936   CREATININE 0.82 07/25/2023 0936   CALCIUM 9.0 07/25/2023 0936   PROT 6.8 07/25/2023 0936   ALBUMIN 4.1 07/25/2023 0936   AST 28 07/25/2023 0936   ALT 12 07/25/2023 0936   ALKPHOS 61 07/25/2023 0936   BILITOT 0.9 07/25/2023 0936   GFRNONAA >60 07/25/2023 0936   GFRAA >60 02/24/2019 1415     No results found for: CEA1, CEA / No results found for: CEA1, CEA No results found for: PSA1 No results found for: CAN199 No results found for: RJW874  Lab Results  Component Value Date   TOTALPROTELP 6.3 04/26/2022   ALBUMINELP 3.8 04/26/2022   A1GS 0.3 04/26/2022   A2GS 0.5 04/26/2022   BETS 0.8 04/26/2022   GAMS 0.9 04/26/2022   MSPIKE Not Observed 04/26/2022   SPEI Comment 04/26/2022   Lab Results  Component Value Date   TIBC 343 04/26/2022   FERRITIN 114 04/26/2022   IRONPCTSAT 25 04/26/2022   Lab Results  Component Value Date   LDH 782 (H) 07/25/2023   LDH 650 (H) 04/18/2023   LDH 704 (H) 03/07/2023     STUDIES:   No results found.

## 2023-07-25 NOTE — Patient Instructions (Signed)

## 2023-07-26 ENCOUNTER — Other Ambulatory Visit: Payer: Self-pay

## 2023-07-26 ENCOUNTER — Inpatient Hospital Stay: Payer: Medicare HMO | Attending: Hematology

## 2023-07-26 VITALS — BP 103/64 | HR 77 | Temp 98.2°F | Resp 18

## 2023-07-26 DIAGNOSIS — D539 Nutritional anemia, unspecified: Secondary | ICD-10-CM | POA: Diagnosis not present

## 2023-07-26 DIAGNOSIS — K5900 Constipation, unspecified: Secondary | ICD-10-CM | POA: Diagnosis not present

## 2023-07-26 DIAGNOSIS — Z5111 Encounter for antineoplastic chemotherapy: Secondary | ICD-10-CM | POA: Diagnosis not present

## 2023-07-26 DIAGNOSIS — C9310 Chronic myelomonocytic leukemia not having achieved remission: Secondary | ICD-10-CM | POA: Diagnosis not present

## 2023-07-26 DIAGNOSIS — Z95828 Presence of other vascular implants and grafts: Secondary | ICD-10-CM

## 2023-07-26 DIAGNOSIS — E538 Deficiency of other specified B group vitamins: Secondary | ICD-10-CM | POA: Diagnosis not present

## 2023-07-26 DIAGNOSIS — R634 Abnormal weight loss: Secondary | ICD-10-CM | POA: Diagnosis not present

## 2023-07-26 DIAGNOSIS — K219 Gastro-esophageal reflux disease without esophagitis: Secondary | ICD-10-CM | POA: Diagnosis not present

## 2023-07-26 DIAGNOSIS — N946 Dysmenorrhea, unspecified: Secondary | ICD-10-CM | POA: Diagnosis not present

## 2023-07-26 DIAGNOSIS — D696 Thrombocytopenia, unspecified: Secondary | ICD-10-CM | POA: Diagnosis not present

## 2023-07-26 MED ORDER — HEPARIN SOD (PORK) LOCK FLUSH 100 UNIT/ML IV SOLN
500.0000 [IU] | Freq: Once | INTRAVENOUS | Status: AC
  Administered 2023-07-26: 500 [IU] via INTRAVENOUS

## 2023-07-26 MED ORDER — SODIUM CHLORIDE 0.9 % IV SOLN
75.0000 mg/m2 | Freq: Once | INTRAVENOUS | Status: AC
  Administered 2023-07-26: 126 mg via INTRAVENOUS
  Filled 2023-07-26: qty 12.6

## 2023-07-26 MED ORDER — SODIUM CHLORIDE 0.9% FLUSH
10.0000 mL | Freq: Once | INTRAVENOUS | Status: AC
  Administered 2023-07-26: 10 mL via INTRAVENOUS

## 2023-07-26 MED ORDER — SODIUM CHLORIDE 0.9 % IV SOLN: Freq: Once | INTRAVENOUS | Status: AC

## 2023-07-26 NOTE — Progress Notes (Signed)
 Patient presents today for chemotherapy infusion of Vidaza , day 2.  Patient is in satisfactory condition with no new complaints voiced.  Vital signs are stable.  Labs reviewed and all labs are within treatment parameters.  We will proceed with treatment per MD orders.    Patient tolerated treatment well with no complaints voiced.  Patient left ambulatory in stable condition.  Vital signs stable at discharge.  Follow up as scheduled.

## 2023-07-26 NOTE — Patient Instructions (Signed)
 [facility] [place] [facility]  Discharge Instructions: Thank you for choosing [facility] to provide your oncology and hematology care.  If you have a lab appointment with the Cancer Center - please note that after April 8th, 2024, all labs will be drawn in the cancer center.  You do not have to check in or register with the main entrance as you have in the past but will complete your check-in in the cancer center.  Wear comfortable clothing and clothing appropriate for easy access to any Portacath or PICC line.   We strive to give you quality time with your provider. You may need to reschedule your appointment if you arrive late (15 or more minutes).  Arriving late affects you and other patients whose appointments are after yours.  Also, if you miss three or more appointments without notifying the office, you may be dismissed from the clinic at the provider's discretion.      For prescription refill requests, have your pharmacy contact our office and allow 72 hours for refills to be completed.    Today you received the following chemotherapy and/or immunotherapy agents Vidaza.  Azacitidine Injection What is this medication? AZACITIDINE (ay za SITE i deen) treats blood and bone marrow cancers. It works by slowing down the growth of cancer cells. This medicine may be used for other purposes; ask your health care provider or pharmacist if you have questions. COMMON BRAND NAME(S): Vidaza What should I tell my care team before I take this medication? They need to know if you have any of these conditions: Kidney disease Liver disease Low blood cell levels, such as low white cells, platelets, or red blood cells Low levels of albumin in the blood Low levels of bicarbonate in the blood An unusual or allergic reaction to azacitidine, mannitol, other medications, foods, dyes, or preservatives If you or your partner are pregnant or trying to get  pregnant Breast-feeding How should I use this medication? This medication is injected into a vein or under the skin. It is given by your care team in a hospital or clinic setting. Talk to your care team about the use of this medication in children. While it may be prescribed for children as young as 1 month for selected conditions, precautions do apply. Overdosage: If you think you have taken too much of this medicine contact a poison control center or emergency room at once. NOTE: This medicine is only for you. Do not share this medicine with others. What if I miss a dose? Keep appointments for follow-up doses. It is important not to miss your dose. Call your care team if you are unable to keep an appointment. What may interact with this medication? Interactions are not expected. This list may not describe all possible interactions. Give your health care provider a list of all the medicines, herbs, non-prescription drugs, or dietary supplements you use. Also tell them if you smoke, drink alcohol, or use illegal drugs. Some items may interact with your medicine. What should I watch for while using this medication? Your condition will be monitored carefully while you are receiving this medication. You may need blood work while taking this medication. This medication may make you feel generally unwell. This is not uncommon as chemotherapy can affect healthy cells as well as cancer cells. Report any side effects. Continue your course of treatment even though you feel ill unless your care team tells you to stop.  Other product types may be available that contain the medication azacitidine. The injection and oral products should not be used in place of one another. Talk to your care team if you have questions. This medication can cause serious side effects. To reduce the risk, your care team may give you other medications to take before receiving this one. Be sure to follow the directions from your care  team. This medication may increase your risk of getting an infection. Call your care team for advice if you get a fever, chills, sore throat, or other symptoms of a cold or flu. Do not treat yourself. Try to avoid being around people who are sick. Avoid taking medications that contain aspirin, acetaminophen, ibuprofen, naproxen, or ketoprofen unless instructed by your care team. These medications may hide a fever. This medication may increase your risk to bruise or bleed. Call your care team if you notice any unusual bleeding. Be careful brushing or flossing your teeth or using a toothpick because you may get an infection or bleed more easily. If you have any dental work done, tell your dentist you are receiving this medication. Talk to your care team if you or your partner may be pregnant. Serious birth defects can occur if you take this medication during pregnancy and for 6 months after the last dose. You will need a negative pregnancy test before starting this medication. Contraception is recommended while taking his medication and for 6 months after the last dose. Your care team can help you find the option that works for you. If your partner can get pregnant, use a condom during sex while taking this medication and for 3 months after the last dose. Do not breastfeed while taking this medication and for 1 week after the last dose. This medication may cause infertility. Talk to your care team if you are concerned about your fertility. What side effects may I notice from receiving this medication? Side effects that you should report to your care team as soon as possible: Allergic reactions--skin rash, itching, hives, swelling of the face, lips, tongue, or throat Infection--fever, chills, cough, sore throat, wounds that don't heal, pain or trouble when passing urine, general feeling of discomfort or being unwell Kidney injury--decrease in the amount of urine, swelling of the ankles, hands, or  feet Liver injury--right upper belly pain, loss of appetite, nausea, light-colored stool, dark yellow or Jairy Angulo urine, yellowing skin or eyes, unusual weakness or fatigue Low red blood cell level--unusual weakness or fatigue, dizziness, headache, trouble breathing Tumor lysis syndrome (TLS)--nausea, vomiting, diarrhea, decrease in the amount of urine, dark urine, unusual weakness or fatigue, confusion, muscle pain or cramps, fast or irregular heartbeat, joint pain Unusual bruising or bleeding Side effects that usually do not require medical attention (report to your care team if they continue or are bothersome): Constipation Diarrhea Nausea Pain, redness, or irritation at injection site Vomiting This list may not describe all possible side effects. Call your doctor for medical advice about side effects. You may report side effects to FDA at 1-800-FDA-1088. Where should I keep my medication? This medication is given in a hospital or clinic. It will not be stored at home. NOTE: This sheet is a summary. It may not cover all possible information. If you have questions about this medicine, talk to your doctor, pharmacist, or health care provider.  2024 Elsevier/Gold Standard (2021-11-19 00:00:00)        To help prevent nausea and vomiting after your treatment, we encourage you to take  your nausea medication as directed.  BELOW ARE SYMPTOMS THAT SHOULD BE REPORTED IMMEDIATELY: *FEVER GREATER THAN 100.4 F (38 C) OR HIGHER *CHILLS OR SWEATING *NAUSEA AND VOMITING THAT IS NOT CONTROLLED WITH YOUR NAUSEA MEDICATION *UNUSUAL SHORTNESS OF BREATH *UNUSUAL BRUISING OR BLEEDING *URINARY PROBLEMS (pain or burning when urinating, or frequent urination) *BOWEL PROBLEMS (unusual diarrhea, constipation, pain near the anus) TENDERNESS IN MOUTH AND THROAT WITH OR WITHOUT PRESENCE OF ULCERS (sore throat, sores in mouth, or a toothache) UNUSUAL RASH, SWELLING OR PAIN  UNUSUAL VAGINAL DISCHARGE OR ITCHING    Items with * indicate a potential emergency and should be followed up as soon as possible or go to the Emergency Department if any problems should occur.  Please show the CHEMOTHERAPY ALERT CARD or IMMUNOTHERAPY ALERT CARD at check-in to the Emergency Department and triage nurse.  Should you have questions after your visit or need to cancel or reschedule your appointment, please contact [facility] [place] - A DEPT OF Eligha Bridegroom [facility] [phone]  and follow the prompts.  Office hours are 8:00 a.m. to 4:30 p.m. Monday - Friday. Please note that voicemails left after 4:00 p.m. may not be returned until the following business day.  We are closed weekends and major holidays. You have access to a nurse at all times for urgent questions. Please call the main number to the clinic [phone] and follow the prompts.  For any non-urgent questions, you may also contact your provider using MyChart. We now offer e-Visits for anyone 44 and older to request care online for non-urgent symptoms. For details visit [email or web address].   Also download the MyChart app! Go to the app store, search "MyChart", open the app, select [place], and log in with your MyChart username and password.

## 2023-07-27 ENCOUNTER — Other Ambulatory Visit: Payer: Self-pay | Admitting: *Deleted

## 2023-07-27 ENCOUNTER — Inpatient Hospital Stay: Payer: Medicare HMO

## 2023-07-27 VITALS — BP 98/62 | HR 75 | Temp 97.2°F | Resp 18

## 2023-07-27 DIAGNOSIS — Z5111 Encounter for antineoplastic chemotherapy: Secondary | ICD-10-CM | POA: Diagnosis not present

## 2023-07-27 DIAGNOSIS — C9310 Chronic myelomonocytic leukemia not having achieved remission: Secondary | ICD-10-CM | POA: Diagnosis not present

## 2023-07-27 DIAGNOSIS — N946 Dysmenorrhea, unspecified: Secondary | ICD-10-CM | POA: Diagnosis not present

## 2023-07-27 DIAGNOSIS — K219 Gastro-esophageal reflux disease without esophagitis: Secondary | ICD-10-CM | POA: Diagnosis not present

## 2023-07-27 DIAGNOSIS — R634 Abnormal weight loss: Secondary | ICD-10-CM | POA: Diagnosis not present

## 2023-07-27 DIAGNOSIS — Z95828 Presence of other vascular implants and grafts: Secondary | ICD-10-CM

## 2023-07-27 DIAGNOSIS — D539 Nutritional anemia, unspecified: Secondary | ICD-10-CM | POA: Diagnosis not present

## 2023-07-27 DIAGNOSIS — E538 Deficiency of other specified B group vitamins: Secondary | ICD-10-CM | POA: Diagnosis not present

## 2023-07-27 DIAGNOSIS — K5900 Constipation, unspecified: Secondary | ICD-10-CM | POA: Diagnosis not present

## 2023-07-27 DIAGNOSIS — D696 Thrombocytopenia, unspecified: Secondary | ICD-10-CM | POA: Diagnosis not present

## 2023-07-27 LAB — URINE CULTURE: Culture: 40000 — AB

## 2023-07-27 MED ORDER — CIPROFLOXACIN HCL 500 MG PO TABS: 500.0000 mg | ORAL_TABLET | Freq: Two times a day (BID) | ORAL | 0 refills | Status: AC

## 2023-07-27 MED ORDER — HEPARIN SOD (PORK) LOCK FLUSH 100 UNIT/ML IV SOLN
500.0000 [IU] | Freq: Once | INTRAVENOUS | Status: AC
  Administered 2023-07-27: 500 [IU] via INTRAVENOUS

## 2023-07-27 MED ORDER — SODIUM CHLORIDE 0.9% FLUSH
10.0000 mL | Freq: Once | INTRAVENOUS | Status: AC
  Administered 2023-07-27: 10 mL via INTRAVENOUS

## 2023-07-27 MED ORDER — SODIUM CHLORIDE 0.9 % IV SOLN
75.0000 mg/m2 | Freq: Once | INTRAVENOUS | Status: AC
  Administered 2023-07-27: 126 mg via INTRAVENOUS
  Filled 2023-07-27: qty 12.6

## 2023-07-27 MED ORDER — SODIUM CHLORIDE 0.9 % IV SOLN
Freq: Once | INTRAVENOUS | Status: DC
Start: 2023-07-27 — End: 2023-07-27

## 2023-07-27 MED ORDER — PALONOSETRON HCL INJECTION 0.25 MG/5ML
0.2500 mg | Freq: Once | INTRAVENOUS | Status: AC
  Administered 2023-07-27: 0.25 mg via INTRAVENOUS
  Filled 2023-07-27: qty 5

## 2023-07-27 NOTE — Progress Notes (Signed)
 Per Dr. Ellin Saba, Cipro 500 mg BID x 7 days sent to pharmacy.  Patient aware.

## 2023-07-27 NOTE — Patient Instructions (Signed)

## 2023-07-27 NOTE — Progress Notes (Signed)
Patient tolerated chemotherapy with no complaints voiced.  Side effects with management reviewed with understanding verbalized.  Port site clean and dry with no bruising or swelling noted at site.  Good blood return noted before and after administration of chemotherapy.  Dressing intact.   Patient left in satisfactory condition with VSS and no s/s of distress noted.  

## 2023-07-28 ENCOUNTER — Encounter: Payer: Self-pay | Admitting: Hematology

## 2023-07-28 ENCOUNTER — Inpatient Hospital Stay: Payer: Medicare HMO

## 2023-07-28 VITALS — BP 102/66 | HR 78 | Temp 97.3°F | Resp 18

## 2023-07-28 DIAGNOSIS — Z5111 Encounter for antineoplastic chemotherapy: Secondary | ICD-10-CM | POA: Diagnosis not present

## 2023-07-28 DIAGNOSIS — E538 Deficiency of other specified B group vitamins: Secondary | ICD-10-CM | POA: Diagnosis not present

## 2023-07-28 DIAGNOSIS — C9310 Chronic myelomonocytic leukemia not having achieved remission: Secondary | ICD-10-CM | POA: Diagnosis not present

## 2023-07-28 DIAGNOSIS — K219 Gastro-esophageal reflux disease without esophagitis: Secondary | ICD-10-CM | POA: Diagnosis not present

## 2023-07-28 DIAGNOSIS — R634 Abnormal weight loss: Secondary | ICD-10-CM | POA: Diagnosis not present

## 2023-07-28 DIAGNOSIS — D539 Nutritional anemia, unspecified: Secondary | ICD-10-CM | POA: Diagnosis not present

## 2023-07-28 DIAGNOSIS — N946 Dysmenorrhea, unspecified: Secondary | ICD-10-CM | POA: Diagnosis not present

## 2023-07-28 DIAGNOSIS — D696 Thrombocytopenia, unspecified: Secondary | ICD-10-CM | POA: Diagnosis not present

## 2023-07-28 DIAGNOSIS — Z95828 Presence of other vascular implants and grafts: Secondary | ICD-10-CM

## 2023-07-28 DIAGNOSIS — K5900 Constipation, unspecified: Secondary | ICD-10-CM | POA: Diagnosis not present

## 2023-07-28 MED ORDER — SODIUM CHLORIDE 0.9 % IV SOLN
75.0000 mg/m2 | Freq: Once | INTRAVENOUS | Status: AC
  Administered 2023-07-28: 126 mg via INTRAVENOUS
  Filled 2023-07-28: qty 12.6

## 2023-07-28 MED ORDER — HEPARIN SOD (PORK) LOCK FLUSH 100 UNIT/ML IV SOLN
500.0000 [IU] | Freq: Once | INTRAVENOUS | Status: AC
  Administered 2023-07-28: 500 [IU] via INTRAVENOUS

## 2023-07-28 MED ORDER — SODIUM CHLORIDE 0.9 % IV SOLN: Freq: Once | INTRAVENOUS | Status: AC

## 2023-07-28 MED ORDER — SODIUM CHLORIDE 0.9% FLUSH
10.0000 mL | Freq: Once | INTRAVENOUS | Status: AC
  Administered 2023-07-28: 10 mL via INTRAVENOUS

## 2023-07-28 NOTE — Progress Notes (Signed)
 Patient presents today for Vidaza  D4. Vital signs within parameters for treatment. No changes noted from last treatment.   Treatment given today per MD orders. Tolerated infusion without adverse affects. Vital signs stable. No complaints at this time. Discharged from clinic ambulatory in stable condition. Alert and oriented x 3. F/U with [facility] as scheduled.

## 2023-07-28 NOTE — Patient Instructions (Signed)
 [facility] [place] [facility]  Discharge Instructions: Thank you for choosing [facility] to provide your oncology and hematology care.  If you have a lab appointment with the Cancer Center - please note that after April 8th, 2024, all labs will be drawn in the cancer center.  You do not have to check in or register with the main entrance as you have in the past but will complete your check-in in the cancer center.  Wear comfortable clothing and clothing appropriate for easy access to any Portacath or PICC line.   We strive to give you quality time with your provider. You may need to reschedule your appointment if you arrive late (15 or more minutes).  Arriving late affects you and other patients whose appointments are after yours.  Also, if you miss three or more appointments without notifying the office, you may be dismissed from the clinic at the provider's discretion.      For prescription refill requests, have your pharmacy contact our office and allow 72 hours for refills to be completed.    Today you received the following chemotherapy and/or immunotherapy agents Vidaza.  Azacitidine Injection What is this medication? AZACITIDINE (ay za SITE i deen) treats blood and bone marrow cancers. It works by slowing down the growth of cancer cells. This medicine may be used for other purposes; ask your health care provider or pharmacist if you have questions. COMMON BRAND NAME(S): Vidaza What should I tell my care team before I take this medication? They need to know if you have any of these conditions: Kidney disease Liver disease Low blood cell levels, such as low white cells, platelets, or red blood cells Low levels of albumin in the blood Low levels of bicarbonate in the blood An unusual or allergic reaction to azacitidine, mannitol, other medications, foods, dyes, or preservatives If you or your partner are pregnant or trying to get  pregnant Breast-feeding How should I use this medication? This medication is injected into a vein or under the skin. It is given by your care team in a hospital or clinic setting. Talk to your care team about the use of this medication in children. While it may be prescribed for children as young as 1 month for selected conditions, precautions do apply. Overdosage: If you think you have taken too much of this medicine contact a poison control center or emergency room at once. NOTE: This medicine is only for you. Do not share this medicine with others. What if I miss a dose? Keep appointments for follow-up doses. It is important not to miss your dose. Call your care team if you are unable to keep an appointment. What may interact with this medication? Interactions are not expected. This list may not describe all possible interactions. Give your health care provider a list of all the medicines, herbs, non-prescription drugs, or dietary supplements you use. Also tell them if you smoke, drink alcohol, or use illegal drugs. Some items may interact with your medicine. What should I watch for while using this medication? Your condition will be monitored carefully while you are receiving this medication. You may need blood work while taking this medication. This medication may make you feel generally unwell. This is not uncommon as chemotherapy can affect healthy cells as well as cancer cells. Report any side effects. Continue your course of treatment even though you feel ill unless your care team tells you to stop.  Other product types may be available that contain the medication azacitidine. The injection and oral products should not be used in place of one another. Talk to your care team if you have questions. This medication can cause serious side effects. To reduce the risk, your care team may give you other medications to take before receiving this one. Be sure to follow the directions from your care  team. This medication may increase your risk of getting an infection. Call your care team for advice if you get a fever, chills, sore throat, or other symptoms of a cold or flu. Do not treat yourself. Try to avoid being around people who are sick. Avoid taking medications that contain aspirin, acetaminophen, ibuprofen, naproxen, or ketoprofen unless instructed by your care team. These medications may hide a fever. This medication may increase your risk to bruise or bleed. Call your care team if you notice any unusual bleeding. Be careful brushing or flossing your teeth or using a toothpick because you may get an infection or bleed more easily. If you have any dental work done, tell your dentist you are receiving this medication. Talk to your care team if you or your partner may be pregnant. Serious birth defects can occur if you take this medication during pregnancy and for 6 months after the last dose. You will need a negative pregnancy test before starting this medication. Contraception is recommended while taking his medication and for 6 months after the last dose. Your care team can help you find the option that works for you. If your partner can get pregnant, use a condom during sex while taking this medication and for 3 months after the last dose. Do not breastfeed while taking this medication and for 1 week after the last dose. This medication may cause infertility. Talk to your care team if you are concerned about your fertility. What side effects may I notice from receiving this medication? Side effects that you should report to your care team as soon as possible: Allergic reactions--skin rash, itching, hives, swelling of the face, lips, tongue, or throat Infection--fever, chills, cough, sore throat, wounds that don't heal, pain or trouble when passing urine, general feeling of discomfort or being unwell Kidney injury--decrease in the amount of urine, swelling of the ankles, hands, or  feet Liver injury--right upper belly pain, loss of appetite, nausea, light-colored stool, dark yellow or Jairy Angulo urine, yellowing skin or eyes, unusual weakness or fatigue Low red blood cell level--unusual weakness or fatigue, dizziness, headache, trouble breathing Tumor lysis syndrome (TLS)--nausea, vomiting, diarrhea, decrease in the amount of urine, dark urine, unusual weakness or fatigue, confusion, muscle pain or cramps, fast or irregular heartbeat, joint pain Unusual bruising or bleeding Side effects that usually do not require medical attention (report to your care team if they continue or are bothersome): Constipation Diarrhea Nausea Pain, redness, or irritation at injection site Vomiting This list may not describe all possible side effects. Call your doctor for medical advice about side effects. You may report side effects to FDA at 1-800-FDA-1088. Where should I keep my medication? This medication is given in a hospital or clinic. It will not be stored at home. NOTE: This sheet is a summary. It may not cover all possible information. If you have questions about this medicine, talk to your doctor, pharmacist, or health care provider.  2024 Elsevier/Gold Standard (2021-11-19 00:00:00)        To help prevent nausea and vomiting after your treatment, we encourage you to take  your nausea medication as directed.  BELOW ARE SYMPTOMS THAT SHOULD BE REPORTED IMMEDIATELY: *FEVER GREATER THAN 100.4 F (38 C) OR HIGHER *CHILLS OR SWEATING *NAUSEA AND VOMITING THAT IS NOT CONTROLLED WITH YOUR NAUSEA MEDICATION *UNUSUAL SHORTNESS OF BREATH *UNUSUAL BRUISING OR BLEEDING *URINARY PROBLEMS (pain or burning when urinating, or frequent urination) *BOWEL PROBLEMS (unusual diarrhea, constipation, pain near the anus) TENDERNESS IN MOUTH AND THROAT WITH OR WITHOUT PRESENCE OF ULCERS (sore throat, sores in mouth, or a toothache) UNUSUAL RASH, SWELLING OR PAIN  UNUSUAL VAGINAL DISCHARGE OR ITCHING    Items with * indicate a potential emergency and should be followed up as soon as possible or go to the Emergency Department if any problems should occur.  Please show the CHEMOTHERAPY ALERT CARD or IMMUNOTHERAPY ALERT CARD at check-in to the Emergency Department and triage nurse.  Should you have questions after your visit or need to cancel or reschedule your appointment, please contact [facility] [place] - A DEPT OF Eligha Bridegroom [facility] [phone]  and follow the prompts.  Office hours are 8:00 a.m. to 4:30 p.m. Monday - Friday. Please note that voicemails left after 4:00 p.m. may not be returned until the following business day.  We are closed weekends and major holidays. You have access to a nurse at all times for urgent questions. Please call the main number to the clinic [phone] and follow the prompts.  For any non-urgent questions, you may also contact your provider using MyChart. We now offer e-Visits for anyone 44 and older to request care online for non-urgent symptoms. For details visit [email or web address].   Also download the MyChart app! Go to the app store, search "MyChart", open the app, select [place], and log in with your MyChart username and password.

## 2023-07-29 ENCOUNTER — Inpatient Hospital Stay: Payer: Medicare HMO

## 2023-07-29 VITALS — BP 104/69 | HR 75 | Temp 97.7°F | Resp 18

## 2023-07-29 DIAGNOSIS — C9310 Chronic myelomonocytic leukemia not having achieved remission: Secondary | ICD-10-CM

## 2023-07-29 DIAGNOSIS — Z5111 Encounter for antineoplastic chemotherapy: Secondary | ICD-10-CM | POA: Diagnosis not present

## 2023-07-29 DIAGNOSIS — R634 Abnormal weight loss: Secondary | ICD-10-CM | POA: Diagnosis not present

## 2023-07-29 DIAGNOSIS — D539 Nutritional anemia, unspecified: Secondary | ICD-10-CM | POA: Diagnosis not present

## 2023-07-29 DIAGNOSIS — Z95828 Presence of other vascular implants and grafts: Secondary | ICD-10-CM

## 2023-07-29 DIAGNOSIS — D696 Thrombocytopenia, unspecified: Secondary | ICD-10-CM | POA: Diagnosis not present

## 2023-07-29 DIAGNOSIS — K5900 Constipation, unspecified: Secondary | ICD-10-CM | POA: Diagnosis not present

## 2023-07-29 DIAGNOSIS — K219 Gastro-esophageal reflux disease without esophagitis: Secondary | ICD-10-CM | POA: Diagnosis not present

## 2023-07-29 DIAGNOSIS — N946 Dysmenorrhea, unspecified: Secondary | ICD-10-CM | POA: Diagnosis not present

## 2023-07-29 DIAGNOSIS — E538 Deficiency of other specified B group vitamins: Secondary | ICD-10-CM | POA: Diagnosis not present

## 2023-07-29 MED ORDER — HEPARIN SOD (PORK) LOCK FLUSH 100 UNIT/ML IV SOLN
500.0000 [IU] | Freq: Once | INTRAVENOUS | Status: AC
  Administered 2023-07-29: 500 [IU] via INTRAVENOUS

## 2023-07-29 MED ORDER — SODIUM CHLORIDE 0.9 % IV SOLN
75.0000 mg/m2 | Freq: Once | INTRAVENOUS | Status: AC
  Administered 2023-07-29: 126 mg via INTRAVENOUS
  Filled 2023-07-29: qty 12.6

## 2023-07-29 MED ORDER — SODIUM CHLORIDE 0.9 % IV SOLN: Freq: Once | INTRAVENOUS | Status: AC

## 2023-07-29 MED ORDER — SODIUM CHLORIDE 0.9% FLUSH
10.0000 mL | Freq: Once | INTRAVENOUS | Status: AC
  Administered 2023-07-29: 10 mL via INTRAVENOUS

## 2023-07-29 MED ORDER — PALONOSETRON HCL INJECTION 0.25 MG/5ML
0.2500 mg | Freq: Once | INTRAVENOUS | Status: AC
  Administered 2023-07-29: 0.25 mg via INTRAVENOUS
  Filled 2023-07-29: qty 5

## 2023-07-29 NOTE — Progress Notes (Signed)
Patient tolerated chemotherapy with no complaints voiced. Side effects with management reviewed understanding verbalized. Port site clean and dry with no bruising or swelling noted at site. Good blood return noted before and after administration of chemotherapy. Band aid applied. Patient left in satisfactory condition with VSS and no s/s of distress noted. 

## 2023-07-29 NOTE — Patient Instructions (Signed)
 [facility] [place] - A DEPT OF [facility]  Discharge Instructions: Thank you for choosing [facility] to provide your oncology and hematology care.  If you have a lab appointment with the Cancer Center - please note that after April 8th, 2024, all labs will be drawn in the cancer center.  You do not have to check in or register with the main entrance as you have in the past but will complete your check-in in the cancer center.  Wear comfortable clothing and clothing appropriate for easy access to any Portacath or PICC line.   We strive to give you quality time with your provider. You may need to reschedule your appointment if you arrive late (15 or more minutes).  Arriving late affects you and other patients whose appointments are after yours.  Also, if you miss three or more appointments without notifying the office, you may be dismissed from the clinic at the provider's discretion.      For prescription refill requests, have your pharmacy contact our office and allow 72 hours for refills to be completed.    Today you received the following chemotherapy and/or immunotherapy agents Vidaza , return as scheduled.   To help prevent nausea and vomiting after your treatment, we encourage you to take your nausea medication as directed.  BELOW ARE SYMPTOMS THAT SHOULD BE REPORTED IMMEDIATELY: *FEVER GREATER THAN 100.4 F (38 C) OR HIGHER *CHILLS OR SWEATING *NAUSEA AND VOMITING THAT IS NOT CONTROLLED WITH YOUR NAUSEA MEDICATION *UNUSUAL SHORTNESS OF BREATH *UNUSUAL BRUISING OR BLEEDING *URINARY PROBLEMS (pain or burning when urinating, or frequent urination) *BOWEL PROBLEMS (unusual diarrhea, constipation, pain near the anus) TENDERNESS IN MOUTH AND THROAT WITH OR WITHOUT PRESENCE OF ULCERS (sore throat, sores in mouth, or a toothache) UNUSUAL RASH, SWELLING OR PAIN  UNUSUAL VAGINAL DISCHARGE OR ITCHING   Items with * indicate a potential emergency and  should be followed up as soon as possible or go to the Emergency Department if any problems should occur.  Please show the CHEMOTHERAPY ALERT CARD or IMMUNOTHERAPY ALERT CARD at check-in to the Emergency Department and triage nurse.  Should you have questions after your visit or need to cancel or reschedule your appointment, please contact [facility] [place] - A DEPT OF [facility] [phone]  and follow the prompts.  Office hours are 8:00 a.m. to 4:30 p.m. Monday - Friday. Please note that voicemails left after 4:00 p.m. may not be returned until the following business day.  We are closed weekends and major holidays. You have access to a nurse at all times for urgent questions. Please call the main number to the clinic [phone] and follow the prompts.  For any non-urgent questions, you may also contact your provider using MyChart. We now offer e-Visits for anyone 45 and older to request care online for non-urgent symptoms. For details visit [email or web address].   Also download the MyChart app! Go to the app store, search MyChart, open the app, select [facility], and log in with your MyChart username and password.

## 2023-08-01 ENCOUNTER — Other Ambulatory Visit: Payer: Self-pay | Admitting: Allergy

## 2023-08-01 ENCOUNTER — Other Ambulatory Visit: Payer: Self-pay | Admitting: Hematology

## 2023-08-01 ENCOUNTER — Other Ambulatory Visit (HOSPITAL_COMMUNITY): Payer: Self-pay | Admitting: Hematology

## 2023-08-01 ENCOUNTER — Inpatient Hospital Stay: Payer: Medicare HMO

## 2023-08-01 ENCOUNTER — Ambulatory Visit: Payer: Medicare HMO

## 2023-08-01 VITALS — BP 117/82 | HR 70 | Temp 96.7°F | Resp 20

## 2023-08-01 DIAGNOSIS — D696 Thrombocytopenia, unspecified: Secondary | ICD-10-CM | POA: Diagnosis not present

## 2023-08-01 DIAGNOSIS — K5900 Constipation, unspecified: Secondary | ICD-10-CM | POA: Diagnosis not present

## 2023-08-01 DIAGNOSIS — C9310 Chronic myelomonocytic leukemia not having achieved remission: Secondary | ICD-10-CM

## 2023-08-01 DIAGNOSIS — D539 Nutritional anemia, unspecified: Secondary | ICD-10-CM | POA: Diagnosis not present

## 2023-08-01 DIAGNOSIS — E538 Deficiency of other specified B group vitamins: Secondary | ICD-10-CM | POA: Diagnosis not present

## 2023-08-01 DIAGNOSIS — Z5111 Encounter for antineoplastic chemotherapy: Secondary | ICD-10-CM | POA: Diagnosis not present

## 2023-08-01 DIAGNOSIS — Z95828 Presence of other vascular implants and grafts: Secondary | ICD-10-CM

## 2023-08-01 DIAGNOSIS — R634 Abnormal weight loss: Secondary | ICD-10-CM | POA: Diagnosis not present

## 2023-08-01 DIAGNOSIS — N946 Dysmenorrhea, unspecified: Secondary | ICD-10-CM | POA: Diagnosis not present

## 2023-08-01 DIAGNOSIS — K219 Gastro-esophageal reflux disease without esophagitis: Secondary | ICD-10-CM | POA: Diagnosis not present

## 2023-08-01 LAB — COMPREHENSIVE METABOLIC PANEL
ALT: 11 U/L (ref 0–44)
AST: 21 U/L (ref 15–41)
Albumin: 4 g/dL (ref 3.5–5.0)
Alkaline Phosphatase: 60 U/L (ref 38–126)
Anion gap: 6 (ref 5–15)
BUN: 13 mg/dL (ref 8–23)
CO2: 26 mmol/L (ref 22–32)
Calcium: 8.9 mg/dL (ref 8.9–10.3)
Chloride: 101 mmol/L (ref 98–111)
Creatinine, Ser: 0.87 mg/dL (ref 0.44–1.00)
GFR, Estimated: >60 mL/min (ref >60)
Glucose, Bld: 116 mg/dL — ABNORMAL HIGH (ref 70–99)
Potassium: 3.9 mmol/L (ref 3.5–5.1)
Sodium: 133 mmol/L — ABNORMAL LOW (ref 135–145)
Total Bilirubin: 1.1 mg/dL (ref 0.0–1.2)
Total Protein: 6.8 g/dL (ref 6.5–8.1)

## 2023-08-01 LAB — CBC WITH DIFFERENTIAL/PLATELET
Abs Immature Granulocytes: 0.1 10*3/uL — ABNORMAL HIGH (ref 0.00–0.07)
Band Neutrophils: 2 %
Basophils Absolute: 0 10*3/uL (ref 0.0–0.1)
Basophils Relative: 0 %
Eosinophils Absolute: 0.2 10*3/uL (ref 0.0–0.5)
Eosinophils Relative: 5 %
HCT: 24 % — ABNORMAL LOW (ref 36.0–46.0)
Hemoglobin: 7.2 g/dL — ABNORMAL LOW (ref 12.0–15.0)
Lymphocytes Relative: 30 %
Lymphs Abs: 1.1 10*3/uL (ref 0.7–4.0)
MCH: 29 pg (ref 26.0–34.0)
MCHC: 30 g/dL (ref 30.0–36.0)
MCV: 96.8 fL (ref 80.0–100.0)
Metamyelocytes Relative: 1 %
Monocytes Absolute: 0 10*3/uL — ABNORMAL LOW (ref 0.1–1.0)
Monocytes Relative: 1 %
Myelocytes: 1 %
Neutro Abs: 2.2 10*3/uL (ref 1.7–7.7)
Neutrophils Relative %: 60 %
Platelets: 65 10*3/uL — ABNORMAL LOW (ref 150–400)
RBC: 2.48 MIL/uL — ABNORMAL LOW (ref 3.87–5.11)
RDW: 19.6 % — ABNORMAL HIGH (ref 11.5–15.5)
WBC: 3.5 10*3/uL — ABNORMAL LOW (ref 4.0–10.5)
nRBC: 3.2 % — ABNORMAL HIGH (ref 0.0–0.2)
nRBC: 4 /100{WBCs} — ABNORMAL HIGH

## 2023-08-01 LAB — MAGNESIUM: Magnesium: 2 mg/dL (ref 1.7–2.4)

## 2023-08-01 LAB — LACTATE DEHYDROGENASE: LDH: 413 U/L — ABNORMAL HIGH (ref 98–192)

## 2023-08-01 LAB — ABO/RH: ABO/RH(D): B POS

## 2023-08-01 LAB — PREPARE RBC (CROSSMATCH)

## 2023-08-01 MED ORDER — SODIUM CHLORIDE 0.9 % IV SOLN: INTRAVENOUS | Status: DC

## 2023-08-01 MED ORDER — PALONOSETRON HCL INJECTION 0.25 MG/5ML
0.2500 mg | Freq: Once | INTRAVENOUS | Status: AC
  Administered 2023-08-01: 0.25 mg via INTRAVENOUS
  Filled 2023-08-01: qty 5

## 2023-08-01 MED ORDER — SODIUM CHLORIDE 0.9 % IV SOLN
75.0000 mg/m2 | Freq: Once | INTRAVENOUS | Status: AC
  Administered 2023-08-01: 126 mg via INTRAVENOUS
  Filled 2023-08-01: qty 12.6

## 2023-08-01 MED ORDER — SODIUM CHLORIDE 0.9% FLUSH
10.0000 mL | INTRAVENOUS | Status: DC | PRN
  Administered 2023-08-01: 10 mL via INTRAVENOUS

## 2023-08-01 NOTE — Progress Notes (Signed)
 Patient is here for day 6 of treatment, no issues reported today since last treatment. Will proceed as planned.   Hemoglobin is 7.2 today. Will order one unit of blood tomorrow per MD.    Treatment given per orders. Patient tolerated it well without problems. Vitals stable and discharged home from clinic ambulatory. Follow up as scheduled.

## 2023-08-01 NOTE — Patient Instructions (Signed)
 [facility] [place] - A DEPT OF [facility]  Discharge Instructions: Thank you for choosing [facility] to provide your oncology and hematology care.  If you have a lab appointment with the Cancer Center - please note that after April 8th, 2024, all labs will be drawn in the cancer center.  You do not have to check in or register with the main entrance as you have in the past but will complete your check-in in the cancer center.  Wear comfortable clothing and clothing appropriate for easy access to any Portacath or PICC line.   We strive to give you quality time with your provider. You may need to reschedule your appointment if you arrive late (15 or more minutes).  Arriving late affects you and other patients whose appointments are after yours.  Also, if you miss three or more appointments without notifying the office, you may be dismissed from the clinic at the provider's discretion.      For prescription refill requests, have your pharmacy contact our office and allow 72 hours for refills to be completed.    Today you received the following chemotherapy and/or immunotherapy agents Azacitadine    To help prevent nausea and vomiting after your treatment, we encourage you to take your nausea medication as directed.  BELOW ARE SYMPTOMS THAT SHOULD BE REPORTED IMMEDIATELY: *FEVER GREATER THAN 100.4 F (38 C) OR HIGHER *CHILLS OR SWEATING *NAUSEA AND VOMITING THAT IS NOT CONTROLLED WITH YOUR NAUSEA MEDICATION *UNUSUAL SHORTNESS OF BREATH *UNUSUAL BRUISING OR BLEEDING *URINARY PROBLEMS (pain or burning when urinating, or frequent urination) *BOWEL PROBLEMS (unusual diarrhea, constipation, pain near the anus) TENDERNESS IN MOUTH AND THROAT WITH OR WITHOUT PRESENCE OF ULCERS (sore throat, sores in mouth, or a toothache) UNUSUAL RASH, SWELLING OR PAIN  UNUSUAL VAGINAL DISCHARGE OR ITCHING   Items with * indicate a potential emergency and should be followed  up as soon as possible or go to the Emergency Department if any problems should occur.  Please show the CHEMOTHERAPY ALERT CARD or IMMUNOTHERAPY ALERT CARD at check-in to the Emergency Department and triage nurse.  Should you have questions after your visit or need to cancel or reschedule your appointment, please contact [facility] [place] - A DEPT OF [facility] [phone]  and follow the prompts.  Office hours are 8:00 a.m. to 4:30 p.m. Monday - Friday. Please note that voicemails left after 4:00 p.m. may not be returned until the following business day.  We are closed weekends and major holidays. You have access to a nurse at all times for urgent questions. Please call the main number to the clinic [phone] and follow the prompts.  For any non-urgent questions, you may also contact your provider using MyChart. We now offer e-Visits for anyone 58 and older to request care online for non-urgent symptoms. For details visit [email or web address].   Also download the MyChart app! Go to the app store, search "MyChart", open the app, select [place], and log in with your MyChart username and password.

## 2023-08-01 NOTE — Progress Notes (Signed)
 Patients port flushed without difficulty.  Good blood return noted with no bruising or swelling noted at site.  Patient remains accessed for treatment.

## 2023-08-02 ENCOUNTER — Inpatient Hospital Stay: Payer: Medicare HMO

## 2023-08-02 VITALS — BP 126/82 | HR 69 | Temp 97.2°F | Resp 18

## 2023-08-02 DIAGNOSIS — K219 Gastro-esophageal reflux disease without esophagitis: Secondary | ICD-10-CM | POA: Diagnosis not present

## 2023-08-02 DIAGNOSIS — C9310 Chronic myelomonocytic leukemia not having achieved remission: Secondary | ICD-10-CM | POA: Diagnosis not present

## 2023-08-02 DIAGNOSIS — E538 Deficiency of other specified B group vitamins: Secondary | ICD-10-CM | POA: Diagnosis not present

## 2023-08-02 DIAGNOSIS — K5900 Constipation, unspecified: Secondary | ICD-10-CM | POA: Diagnosis not present

## 2023-08-02 DIAGNOSIS — Z95828 Presence of other vascular implants and grafts: Secondary | ICD-10-CM

## 2023-08-02 DIAGNOSIS — D696 Thrombocytopenia, unspecified: Secondary | ICD-10-CM | POA: Diagnosis not present

## 2023-08-02 DIAGNOSIS — R634 Abnormal weight loss: Secondary | ICD-10-CM | POA: Diagnosis not present

## 2023-08-02 DIAGNOSIS — N946 Dysmenorrhea, unspecified: Secondary | ICD-10-CM | POA: Diagnosis not present

## 2023-08-02 DIAGNOSIS — D539 Nutritional anemia, unspecified: Secondary | ICD-10-CM | POA: Diagnosis not present

## 2023-08-02 DIAGNOSIS — Z5111 Encounter for antineoplastic chemotherapy: Secondary | ICD-10-CM | POA: Diagnosis not present

## 2023-08-02 MED ORDER — SODIUM CHLORIDE 0.9 % IV SOLN: Freq: Once | INTRAVENOUS | Status: AC

## 2023-08-02 MED ORDER — SODIUM CHLORIDE 0.9% FLUSH
10.0000 mL | Freq: Once | INTRAVENOUS | Status: AC
  Administered 2023-08-02: 10 mL via INTRAVENOUS

## 2023-08-02 MED ORDER — SODIUM CHLORIDE 0.9 % IV SOLN
75.0000 mg/m2 | Freq: Once | INTRAVENOUS | Status: AC
  Administered 2023-08-02: 126 mg via INTRAVENOUS
  Filled 2023-08-02: qty 12.6

## 2023-08-02 MED ORDER — SODIUM CHLORIDE 0.9% IV SOLUTION
250.0000 mL | INTRAVENOUS | Status: DC
  Administered 2023-08-02: 250 mL via INTRAVENOUS

## 2023-08-02 MED ORDER — DIPHENHYDRAMINE HCL 25 MG PO CAPS
25.0000 mg | ORAL_CAPSULE | Freq: Once | ORAL | Status: AC
Start: 2023-08-02 — End: 2023-08-02
  Administered 2023-08-02: 25 mg via ORAL
  Filled 2023-08-02: qty 1

## 2023-08-02 MED ORDER — ACETAMINOPHEN 325 MG PO TABS
650.0000 mg | ORAL_TABLET | Freq: Once | ORAL | Status: AC
  Administered 2023-08-02: 650 mg via ORAL
  Filled 2023-08-02: qty 2

## 2023-08-02 MED ORDER — HEPARIN SOD (PORK) LOCK FLUSH 100 UNIT/ML IV SOLN
500.0000 [IU] | Freq: Once | INTRAVENOUS | Status: AC
  Administered 2023-08-02: 500 [IU] via INTRAVENOUS

## 2023-08-02 NOTE — Progress Notes (Signed)
 Patient reports no changes since yesterday. Will proceed with treatment and one unit of blood transfusion today.

## 2023-08-02 NOTE — Patient Instructions (Signed)
 [facility] [place] - A DEPT OF [facility]  Discharge Instructions: Thank you for choosing [facility] to provide your oncology and hematology care.  If you have a lab appointment with the Cancer Center - please note that after April 8th, 2024, all labs will be drawn in the cancer center.  You do not have to check in or register with the main entrance as you have in the past but will complete your check-in in the cancer center.  Wear comfortable clothing and clothing appropriate for easy access to any Portacath or PICC line.   We strive to give you quality time with your provider. You may need to reschedule your appointment if you arrive late (15 or more minutes).  Arriving late affects you and other patients whose appointments are after yours.  Also, if you miss three or more appointments without notifying the office, you may be dismissed from the clinic at the provider's discretion.      For prescription refill requests, have your pharmacy contact our office and allow 72 hours for refills to be completed.    Today you received the following chemotherapy and/or immunotherapy agents Vidaza  and 1 unit of blood.   To help prevent nausea and vomiting after your treatment, we encourage you to take your nausea medication as directed.  Azacitidine  Injection What is this medication? AZACITIDINE  (ay [employer] i deen) treats blood and bone marrow cancers. It works by slowing down the growth of cancer cells. This medicine may be used for other purposes; ask your health care provider or pharmacist if you have questions. COMMON BRAND NAME(S): Vidaza  What should I tell my care team before I take this medication? They need to know if you have any of these conditions: Kidney disease Liver disease Low blood cell levels, such as low white cells, platelets, or red blood cells Low levels of albumin in the blood Low levels of bicarbonate in the blood An unusual or allergic  reaction to azacitidine , mannitol, other medications, foods, dyes, or preservatives If you or your partner are pregnant or trying to get pregnant Breast-feeding How should I use this medication? This medication is injected into a vein or under the skin. It is given by your care team in a hospital or clinic setting. Talk to your care team about the use of this medication in children. While it may be prescribed for children as young as 1 month for selected conditions, precautions do apply. Overdosage: If you think you have taken too much of this medicine contact a poison control center or emergency room at once. NOTE: This medicine is only for you. Do not share this medicine with others. What if I miss a dose? Keep appointments for follow-up doses. It is important not to miss your dose. Call your care team if you are unable to keep an appointment. What may interact with this medication? Interactions are not expected. This list may not describe all possible interactions. Give your health care provider a list of all the medicines, herbs, non-prescription drugs, or dietary supplements you use. Also tell them if you smoke, drink alcohol, or use illegal drugs. Some items may interact with your medicine. What should I watch for while using this medication? Your condition will be monitored carefully while you are receiving this medication. You may need blood work while taking this medication. This medication may make you feel generally unwell. This is not uncommon as chemotherapy can affect healthy cells as  well as cancer cells. Report any side effects. Continue your course of treatment even though you feel ill unless your care team tells you to stop. Other product types may be available that contain the medication azacitidine . The injection and oral products should not be used in place of one another. Talk to your care team if you have questions. This medication can cause serious side effects. To reduce the  risk, your care team may give you other medications to take before receiving this one. Be sure to follow the directions from your care team. This medication may increase your risk of getting an infection. Call your care team for advice if you get a fever, chills, sore throat, or other symptoms of a cold or flu. Do not treat yourself. Try to avoid being around people who are sick. Avoid taking medications that contain aspirin, acetaminophen , ibuprofen, naproxen , or ketoprofen unless instructed by your care team. These medications may hide a fever. This medication may increase your risk to bruise or bleed. Call your care team if you notice any unusual bleeding. Be careful brushing or flossing your teeth or using a toothpick because you may get an infection or bleed more easily. If you have any dental work done, tell your dentist you are receiving this medication. Talk to your care team if you or your partner may be pregnant. Serious birth defects can occur if you take this medication during pregnancy and for 6 months after the last dose. You will need a negative pregnancy test before starting this medication. Contraception is recommended while taking his medication and for 6 months after the last dose. Your care team can help you find the option that works for you. If your partner can get pregnant, use a condom during sex while taking this medication and for 3 months after the last dose. Do not breastfeed while taking this medication and for 1 week after the last dose. This medication may cause infertility. Talk to your care team if you are concerned about your fertility. What side effects may I notice from receiving this medication? Side effects that you should report to your care team as soon as possible: Allergic reactions--skin rash, itching, hives, swelling of the face, lips, tongue, or throat Infection--fever, chills, cough, sore throat, wounds that don't heal, pain or trouble when passing urine,  general feeling of discomfort or being unwell Kidney injury--decrease in the amount of urine, swelling of the ankles, hands, or feet Liver injury--right upper belly pain, loss of appetite, nausea, light-colored stool, dark yellow or brown urine, yellowing skin or eyes, unusual weakness or fatigue Low red blood cell level--unusual weakness or fatigue, dizziness, headache, trouble breathing Tumor lysis syndrome (TLS)--nausea, vomiting, diarrhea, decrease in the amount of urine, dark urine, unusual weakness or fatigue, confusion, muscle pain or cramps, fast or irregular heartbeat, joint pain Unusual bruising or bleeding Side effects that usually do not require medical attention (report to your care team if they continue or are bothersome): Constipation Diarrhea Nausea Pain, redness, or irritation at injection site Vomiting This list may not describe all possible side effects. Call your doctor for medical advice about side effects. You may report side effects to FDA at 1-800-FDA-1088. Where should I keep my medication? This medication is given in a hospital or clinic. It will not be stored at home. NOTE: This sheet is a summary. It may not cover all possible information. If you have questions about this medicine, talk to your doctor, pharmacist, or health care provider.  2024 Elsevier/Gold Standard (2021-11-19 00:00:00)   BELOW ARE SYMPTOMS THAT SHOULD BE REPORTED IMMEDIATELY: *FEVER GREATER THAN 100.4 F (38 C) OR HIGHER *CHILLS OR SWEATING *NAUSEA AND VOMITING THAT IS NOT CONTROLLED WITH YOUR NAUSEA MEDICATION *UNUSUAL SHORTNESS OF BREATH *UNUSUAL BRUISING OR BLEEDING *URINARY PROBLEMS (pain or burning when urinating, or frequent urination) *BOWEL PROBLEMS (unusual diarrhea, constipation, pain near the anus) TENDERNESS IN MOUTH AND THROAT WITH OR WITHOUT PRESENCE OF ULCERS (sore throat, sores in mouth, or a toothache) UNUSUAL RASH, SWELLING OR PAIN  UNUSUAL VAGINAL DISCHARGE OR ITCHING    Items with * indicate a potential emergency and should be followed up as soon as possible or go to the Emergency Department if any problems should occur.  Please show the CHEMOTHERAPY ALERT CARD or IMMUNOTHERAPY ALERT CARD at check-in to the Emergency Department and triage nurse.  Should you have questions after your visit or need to cancel or reschedule your appointment, please contact [facility] [place] - A DEPT OF [facility] [phone]  and follow the prompts.  Office hours are 8:00 a.m. to 4:30 p.m. Monday - Friday. Please note that voicemails left after 4:00 p.m. may not be returned until the following business day.  We are closed weekends and major holidays. You have access to a nurse at all times for urgent questions. Please call the main number to the clinic [phone] and follow the prompts.  For any non-urgent questions, you may also contact your provider using MyChart. We now offer e-Visits for anyone 47 and older to request care online for non-urgent symptoms. For details visit [email or web address].   Also download the MyChart app! Go to the app store, search MyChart, open the app, select , and log in with your MyChart username and password.

## 2023-08-02 NOTE — Progress Notes (Signed)
 D7 Vidaza  and 1 unit of blood given today per MD orders. Tolerated infusion without adverse affects. Vital signs stable. No complaints at this time. Discharged from clinic ambulatory in stable condition. Alert and oriented x 3. F/U with [facility] as scheduled.

## 2023-08-03 LAB — BPAM RBC
Blood Product Expiration Date: 202501232359
ISSUE DATE / TIME: 202501140945
Unit Type and Rh: 202501232359
Unit Type and Rh: 7300

## 2023-08-03 LAB — TYPE AND SCREEN
ABO/RH(D): B POS
Antibody Screen: NEGATIVE
Unit division: 0

## 2023-08-04 ENCOUNTER — Other Ambulatory Visit: Payer: Self-pay

## 2023-08-05 ENCOUNTER — Ambulatory Visit (INDEPENDENT_AMBULATORY_CARE_PROVIDER_SITE_OTHER): Payer: Medicare HMO

## 2023-08-05 DIAGNOSIS — J4540 Moderate persistent asthma, uncomplicated: Secondary | ICD-10-CM | POA: Diagnosis not present

## 2023-08-10 ENCOUNTER — Ambulatory Visit: Payer: Medicare HMO | Admitting: Allergy

## 2023-08-11 ENCOUNTER — Other Ambulatory Visit: Payer: Self-pay | Admitting: Allergy

## 2023-08-21 NOTE — Progress Notes (Signed)
[name] [address] [place] [address]    Clinic Day:  08/22/2023  Referring physician: Carylon Perches, MD  Patient Care Team: Carylon Perches, MD as PCP - General (Internal Medicine) Jena Gauss Gerrit Friends, MD as Attending Physician (Gastroenterology) Storm Frisk, MD as Consulting Physician (Pulmonary Disease) Doreatha Massed, MD as Medical Oncologist (Hematology)   ASSESSMENT & PLAN:   Assessment: 1.  Macrocytic anemia and thrombocytopenia: - Patient seen at the request of Dr. Ouida Sills for abnormal CBC. - 04/17/2022: WBC 15.8, Hb-9.7, PLT-128 - 03/24/2022: WBC-8.1 (N-43%, L-17%, M-19%, B-2%), Hb-9.6, MCV-99, PLT-115 - Smear review: 13% metamyelocytes, 2% myelocytes, 2% blasts,  - 10/19/2021: WBC-5.3 (N-61, L-21, M-10%), Hb-11, MCV-96, PLT-97 - BMBX (05/25/2022): Hypercellular bone marrow with myeloid hyperplasia with dysgranulopoiesis, erythroid hypoplasia and megakaryocytic hyperplasia with dyspoiesis.  Chromosome analysis was 10, XX.  Bone marrow blasts less than 5%.  Peripheral blood blasts less than 2%. - Mayo molecular model risk stratification: Intermediate 2 risk with at least 2 points (decreased hemoglobin less than 10, circulating immature cells).  Intermediate 2 risk with median OS 31 months. - Serum EPO level 41. - NGS: Positive for CBL, MPL, SRSF2, TET 2, RAD21 - PET scan (07/08/2022): Splenomegaly (volume 510 mm) with normal metabolic activity.  Uniform increase in marrow metabolic activity related to patient's anemia.  No lymphadenopathy. - BMBX (07/16/2022): Hypercellular bone marrow (95-100%) with myeloid hyperplasia, atypical monocytes, increased blasts (16% overall).  Atypical monocytosis present 23% of total events, expressing HLA-DR, CD38, CD4 dim, CD11C, CD13, CD14, CD36, CD64 with aberrant coexpression of CD56 and CD7. - Cycle 1 of azacitidine on 08/02/2022, cycle 2 on 08/30/2022.  Azacitidine decreased to 5 days starting cycle 5 on 11/22/2022 - BMBX  (09/21/2022): 3% blasts, hypercellular marrow with markedly increased dyspoietic megakaryocytes, increased fibrosis - BMBX (11/16/2022): Hypercellular marrow (70-80%) with marked GIST [name] (, myelofibrosis and 3% blasts on a very limited sample. - BMBX (05/06/2023): Hypercellular marrow with 85% cellularity with fibrosis and 4% blasts approximately. - BMBX (07/08/2023): Hypercellular marrow (95%) with increased myelopoiesis, dysmenorrhea Leary Roca is, increased fibrosis and 5% blasts.  Cytogenetics 19, XX. - NGS (07/08/2023): MPL, SRSF2, RUNX1, TET2   2.  Social/family history: - She lives at home by herself.  Son lives next door. - Does part-time work at [employer].  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  Non-smoker. - Maternal aunt had tumor in the breast, patient not certain if it is cancer.  2 maternal first cousins had lymphoma.  Mother had stomach cancer.    Plan: 1.  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 - She did not have any GI symptoms.  She reportedly felt woozy for 10 days.  Today she is feeling fine. - Reviewed labs today: WBC-5.6, PLT-143, Hb-9.5.  She did receive 1 unit PRBC on 08/01/2023 for hemoglobin 7.2. - She may proceed with cycle 13 azacitidine 75 mg/m x 7 days.  She will have bone marrow biopsy done with Dr. Lowell Guitar on 09/13/2023.  RTC 4 weeks for follow-up.   2.  Folate deficiency: - Continue folic acid tablet daily.   3.  Weight loss: - She is drinking boost +1 to 2 cans/day.  Weight is stable since last visit.   4.  Constipation: - Continue Colace and lactulose as needed.  Well-controlled.    Orders Placed This Encounter  Procedures   Comprehensive metabolic panel  Standing Status:   Future    Expected Date:   09/19/2023    Expiration Date:   09/18/2024   Magnesium    Standing Status:   Future    Expected Date:   09/19/2023    Expiration Date:   09/18/2024   Lactate  dehydrogenase    Standing Status:   Future    Expected Date:   09/19/2023    Expiration Date:   09/18/2024   CMP (Cancer Center only)    Standing Status:   Future    Expected Date:   09/19/2023    Expiration Date:   09/18/2024   CBC with Differential    Standing Status:   Future    Expected Date:   09/19/2023    Expiration Date:   09/18/2024      I,Katie Daubenspeck,acting as a scribe for Doreatha Massed, MD.,have documented all relevant documentation on the behalf of Doreatha Massed, MD,as directed by  Doreatha Massed, MD while in the presence of Doreatha Massed, MD.   I, Doreatha Massed MD, have reviewed the above documentation for accuracy and completeness, and I agree with the above.   Doreatha Massed, MD   2/3/202512:31 PM  CHIEF COMPLAINT:   Diagnosis: CMML-2    Cancer Staging  No matching staging information was found for the patient.    Prior Therapy: none  Current Therapy:  Azacitidine 70 mg/m x 7 days every 28 days    HISTORY OF PRESENT ILLNESS:   Oncology History  CMML (chronic myelomonocytic leukemia) (HCC)  06/03/2022 Initial Diagnosis   CMML (chronic myelomonocytic leukemia) (HCC)   08/02/2022 -  Chemotherapy   Patient is on Treatment Plan : MYELODYSPLASIA  Azacitidine IVPB D1-7 q28d        INTERVAL HISTORY:   [name] is a 73 y.o. female presenting to clinic today for follow up of CMML-2. She was last seen by me on 07/25/23.  Today, she states that she is doing well overall. Her appetite level is at 75%. Her energy level is at 75%.  PAST MEDICAL HISTORY:   Past Medical History: Past Medical History:  Diagnosis Date   Anxiety    Asthma    had 1 episode 1 year ago-no more problem   CHF (congestive heart failure) (HCC)    swelling of feet & ankles   CMML (chronic myelomonocytic leukemia) (HCC) 06/03/2022   Cough    Depression    OCD   GERD (gastroesophageal reflux disease)    severe   OCD (obsessive compulsive disorder)     Port-A-Cath in place 07/26/2022   Shortness of breath    with exertion   Yeast infection 04/09/2014    Surgical History: Past Surgical History:  Procedure Laterality Date   CARPAL TUNNEL RELEASE  03/22/2012   Procedure: CARPAL TUNNEL RELEASE;  Surgeon: Nicki Reaper, MD;  Location: [facility];  Service: Orthopedics;  Laterality: Right;  right carpal tunnel release   COLONOSCOPY  06/19/2012   Dr. Rourk:normal rectum and colon    ESOPHAGOGASTRODUODENOSCOPY N/A 12/26/2015   Dr. Jena Gauss: normal    IR IMAGING GUIDED PORT INSERTION  07/30/2022   TONSILLECTOMY     TUBAL LIGATION      Social History: Social History   Socioeconomic History   Marital status: Widowed    Spouse name: Not on file   Number of children: 1   Years of education: Not on file   Highest education level: Not on file  Occupational History   Occupation:  retired  Tobacco Use   Smoking status: Never    Passive exposure: Yes   Smokeless tobacco: Never  Vaping Use   Vaping status: Never Used  Substance and Sexual Activity   Alcohol use: No   Drug use: No   Sexual activity: Yes    Birth control/protection: Surgical    Comment: tubal  Other Topics Concern   Not on file  Social History Narrative   Not on file   Social Drivers of Health   Financial Resource Strain: Not on file  Food Insecurity: Not on file  Transportation Needs: Not on file  Physical Activity: Not on file  Stress: Not on file  Social Connections: Not on file  Intimate Partner Violence: Not on file    Family History: Family History  Problem Relation Age of Onset   Asthma Mother    Macular degeneration Mother    Hypertension Mother    Stomach cancer Mother 34   Alzheimer's disease Father    Other Sister        blood clots   Diabetes Maternal Grandfather    Diabetes Paternal Grandfather    Other Son        enlarged spleen   Lymphoma Cousin        x2, both dx in their 14s   Colon cancer Neg Hx     Current  Medications:  Current Outpatient Medications:    albuterol (VENTOLIN HFA) 108 (90 Base) MCG/ACT inhaler, INHALE 2 PUFFS BY MOUTH EVERY 6 HOURS AS NEEDED FOR SHORTNESS OF BREATH OR WHEEZING., Disp: 18 g, Rfl: 2   albuterol (VENTOLIN HFA) 108 (90 Base) MCG/ACT inhaler, INHALE TWO PUFFS EVERY 4 HOURS AS NEEDED FOR WHEEZING OR FOR SHORTNESS OF BREATH, Disp: 18 g, Rfl: 1   ARNUITY ELLIPTA 200 MCG/ACT AEPB, Inhale 1 puff into the lungs daily., Disp: 30 each, Rfl: 5   azaCITIDine 5 mg/2 mLs in lactated ringers infusion, Inject into the vein daily. Days 1-7 every 28 days, Disp: , Rfl:    azelastine (ASTELIN) 0.1 % nasal spray, SPRAY 1 TO 2 SPRAYS PER NOSTRIL TWICE DAILY., Disp: 30 mL, Rfl: 3   benzonatate (TESSALON) 200 MG capsule, Take 200 mg by mouth 3 (three) times daily as needed., Disp: , Rfl:    BREZTRI AEROSPHERE 160-9-4.8 MCG/ACT AERO, Inhale 2 puffs into the lungs 2 (two) times daily., Disp: 10.7 g, Rfl: 5   cetirizine (ZYRTEC) 5 MG tablet, Take 1 tablet (5 mg total) by mouth daily., Disp: 30 tablet, Rfl: 0   ciclopirox (PENLAC) 8 % solution, APPLY TOPICALLY AT BEDTIME. APPLY OVER NAIL FOLD AND SURROUNDING SKIN. APPLY DAILY OVER PREVIOUS COAT. AFTER SEVEN DAYS, MAY REMOVE WITH ALCOHOL AND CONTINUE CYCLE., Disp: 6.6 mL, Rfl: 2   EPINEPHrine 0.3 mg/0.3 mL IJ SOAJ injection, Inject 0.3 mg into the muscle as needed for anaphylaxis., Disp: 2 each, Rfl: 1   famotidine (PEPCID) 20 MG tablet, Take 1 tablet (20 mg total) by mouth 2 (two) times daily., Disp: 60 tablet, Rfl: 5   FLUoxetine (PROZAC) 20 MG capsule, Take 1 capsule by mouth daily., Disp: , Rfl:    fluticasone (FLONASE) 50 MCG/ACT nasal spray, Place 2 sprays into both nostrils 2 (two) times daily., Disp: 16 g, Rfl: 5   fluticasone (FLOVENT HFA) 110 MCG/ACT inhaler, Inhale two puffs twice daily to prevent cough or wheeze. Rinse mouth after use., Disp: 12 g, Rfl: 5   folic acid (FOLVITE) 1 MG tablet, TAKE ONE TABLET BY MOUTH ONCE  DAILY, Disp: 30  tablet, Rfl: 5   gabapentin (NEURONTIN) 300 MG capsule, , Disp: , Rfl:    HYDROcodone bit-homatropine (HYCODAN) 5-1.5 MG/5ML syrup, , Disp: , Rfl:    ipratropium (ATROVENT) 0.06 % nasal spray, USE 2 SPRAYS IN EACH NOSTRIL THREE TIMES A DAY AS NEEDED., Disp: 15 mL, Rfl: 5   lactulose (CHRONULAC) 10 GM/15ML solution, take 30ml BY MOUTH EVERY THREE HOURS UNTIL bowel movement; THEN take 30ml ONCE DAILY AS NEEDED FOR CONSTIPATION, Disp: 450 mL, Rfl: 2   lidocaine-prilocaine (EMLA) cream, Apply a quarter sized amount to port a cath site and cover with plastic wrap one hour prior to infusion appointments, Disp: 30 g, Rfl: 3   megestrol (MEGACE) 400 MG/10ML suspension, Take 10 mLs (400 mg total) by mouth 2 (two) times daily., Disp: 480 mL, Rfl: 2   montelukast (SINGULAIR) 10 MG tablet, TAKE ONE TABLET BY MOUTH AT BEDTIME, Disp: 30 tablet, Rfl: 3   NON FORMULARY, [facility]  Antifungal (nail)-#1, Disp: , Rfl:    pantoprazole (PROTONIX) 40 MG tablet, Take 1 tablet (40 mg total) by mouth daily., Disp: 30 tablet, Rfl: 5   prochlorperazine (COMPAZINE) 10 MG tablet, Take 1 tablet (10 mg total) by mouth every 6 (six) hours as needed for nausea or vomiting., Disp: 30 tablet, Rfl: 0   Respiratory Therapy Supplies (FLUTTER) DEVI, Use as directed, Disp: 1 each, Rfl: 3   SF 5000 PLUS 1.1 % CREA dental cream, , Disp: , Rfl:    Spacer/Aero-Holding Chambers (AEROCHAMBER PLUS WITH MASK) inhaler, 1 each by Other route See admin instructions. Use with inhaler as instructed., Disp: 1 each, Rfl: 1   Tezepelumab-ekko (TEZSPIRE) 210 MG/1. SOAJ, Inject 210 mg into the skin every 28 (twenty-eight) days., Disp: 1.91 mL, Rfl: 11   tiZANidine (ZANAFLEX) 2 MG tablet, Take 1 tablet (2 mg total) by mouth 2 (two) times daily as needed for muscle spasms., Disp: 15 tablet, Rfl: 0   traZODone (DESYREL) 50 MG tablet, TAKE ONE TABLET BY MOUTH AT BEDTIME, Disp: 30 tablet, Rfl: 3  Current Facility-Administered Medications:     tezepelumab-ekko (TEZSPIRE) 210 MG/1. syringe 210 mg, 210 mg, Subcutaneous, Q28 days, Marcelyn Bruins, MD, 210 mg at 08/05/23 1351   Allergies: Allergies  Allergen Reactions   Levonorgestrel-Ethinyl Estrad Cough   Sulfa Antibiotics Other (See Comments)    Unknown- pt unsure of reaction; believes it may be nausea   Sulfamethoxazole-Trimethoprim Other (See Comments)   Cephalosporins Other (See Comments)    Other Reaction: Toxicity    REVIEW OF SYSTEMS:   Review of Systems  Constitutional:  Negative for chills, fatigue and fever.  HENT:   Negative for lump/mass, mouth sores, nosebleeds, sore throat and trouble swallowing.   Eyes:  Negative for eye problems.  Respiratory:  Positive for cough and shortness of breath.   Cardiovascular:  Negative for chest pain, leg swelling and palpitations.  Gastrointestinal:  Positive for abdominal pain and constipation. Negative for diarrhea, nausea and vomiting.  Genitourinary:  Negative for bladder incontinence, difficulty urinating, dysuria, frequency, hematuria and nocturia.   Musculoskeletal:  Negative for arthralgias, back pain, flank pain, myalgias and neck pain.  Skin:  Negative for itching and rash.  Neurological:  Positive for dizziness. Negative for headaches and numbness.  Hematological:  Does not bruise/bleed easily.  Psychiatric/Behavioral:  Negative for depression, sleep disturbance and suicidal ideas. The patient is not nervous/anxious.   All other systems reviewed and are negative.    VITALS:   Blood pressure  112/65, pulse 87, temperature (!) 97.3 F (36.3 C), temperature source Tympanic, resp. rate 18, weight 131 lb 6.3 oz (59.6 kg), SpO2 98%.  Wt Readings from Last 3 Encounters:  08/22/23 131 lb 6.3 oz (59.6 kg)  07/25/23 131 lb 6.3 oz (59.6 kg)  06/24/23 137 lb (62.1 kg)    Body mass index is 25.66 kg/m.  Performance status (ECOG): 1 - Symptomatic but completely ambulatory  PHYSICAL EXAM:   Physical  Exam Vitals and nursing note reviewed. Exam conducted with a chaperone present.  Constitutional:      Appearance: Normal appearance.  Cardiovascular:     Rate and Rhythm: Normal rate and regular rhythm.     Pulses: Normal pulses.     Heart sounds: Normal heart sounds.  Pulmonary:     Effort: Pulmonary effort is normal.     Breath sounds: Normal breath sounds.  Abdominal:     Palpations: Abdomen is soft. There is no hepatomegaly, splenomegaly or mass.     Tenderness: There is no abdominal tenderness.  Musculoskeletal:     Right lower leg: No edema.     Left lower leg: No edema.  Lymphadenopathy:     Cervical: No cervical adenopathy.     Right cervical: No superficial, deep or posterior cervical adenopathy.    Left cervical: No superficial, deep or posterior cervical adenopathy.     Upper Body:     Right upper body: No supraclavicular or axillary adenopathy.     Left upper body: No supraclavicular or axillary adenopathy.  Neurological:     General: No focal deficit present.     Mental Status: She is alert and oriented to person, place, and time.  Psychiatric:        Mood and Affect: Mood normal.        Behavior: Behavior normal.     LABS:   CBC     Component Value Date/Time   WBC 5.6 08/22/2023 0847   RBC 3.10 (L) 08/22/2023 0847   HGB 9.5 (L) 08/22/2023 0847   HGB 13.1 07/23/2020 1112   HCT 30.4 (L) 08/22/2023 0847   HCT 40.7 07/23/2020 1112   PLT 143 (L) 08/22/2023 0847   MCV 98.1 08/22/2023 0847   MCV 95 07/23/2020 1112   MCH 30.6 08/22/2023 0847   MCHC 31.3 08/22/2023 0847   RDW 19.8 (H) 08/22/2023 0847   RDW 14.2 07/23/2020 1112   LYMPHSABS 1.1 08/22/2023 0847   LYMPHSABS 0.7 07/23/2020 1112   MONOABS 0.4 08/22/2023 0847   EOSABS 0.0 08/22/2023 0847   EOSABS 0.1 07/23/2020 1112   BASOSABS 0.0 08/22/2023 0847   BASOSABS 0.0 07/23/2020 1112    CMP      Component Value Date/Time   NA 138 08/22/2023 0847   K 4.1 08/22/2023 0847   CL 103 08/22/2023  0847   CO2 24 08/22/2023 0847   GLUCOSE 123 (H) 08/22/2023 0847   BUN 11 08/22/2023 0847   CREATININE 0.97 08/22/2023 0847   CALCIUM 9.3 08/22/2023 0847   PROT 7.2 08/22/2023 0847   ALBUMIN 4.1 08/22/2023 0847   AST 30 08/22/2023 0847   ALT 12 08/22/2023 0847   ALKPHOS 58 08/22/2023 0847   BILITOT 1.8 (H) 08/22/2023 0847   GFRNONAA >60 08/22/2023 0847   GFRAA >60 02/24/2019 1415     No results found for: "CEA1", "CEA" / No results found for: "CEA1", "CEA" No results found for: "PSA1" No results found for: "ZOX096" No results found for: "EAV409"  Lab Results  Component Value Date   TOTALPROTELP 6.3 04/26/2022   ALBUMINELP 3.8 04/26/2022   A1GS 0.3 04/26/2022   A2GS 0.5 04/26/2022   BETS 0.8 04/26/2022   GAMS 0.9 04/26/2022   MSPIKE Not Observed 04/26/2022   SPEI Comment 04/26/2022   Lab Results  Component Value Date   TIBC 343 04/26/2022   FERRITIN 114 04/26/2022   IRONPCTSAT 25 04/26/2022   Lab Results  Component Value Date   LDH 677 (H) 08/22/2023   LDH 413 (H) 08/01/2023   LDH 782 (H) 07/25/2023     STUDIES:   No results found.

## 2023-08-22 ENCOUNTER — Inpatient Hospital Stay: Payer: Medicare HMO

## 2023-08-22 ENCOUNTER — Inpatient Hospital Stay: Payer: Medicare HMO | Attending: Hematology | Admitting: Hematology

## 2023-08-22 VITALS — BP 135/89 | HR 89 | Temp 97.1°F | Resp 20

## 2023-08-22 VITALS — BP 112/65 | HR 87 | Temp 97.3°F | Resp 18 | Wt 131.4 lb

## 2023-08-22 DIAGNOSIS — C9310 Chronic myelomonocytic leukemia not having achieved remission: Secondary | ICD-10-CM

## 2023-08-22 DIAGNOSIS — D539 Nutritional anemia, unspecified: Secondary | ICD-10-CM | POA: Diagnosis not present

## 2023-08-22 DIAGNOSIS — Z95828 Presence of other vascular implants and grafts: Secondary | ICD-10-CM

## 2023-08-22 DIAGNOSIS — Z5111 Encounter for antineoplastic chemotherapy: Secondary | ICD-10-CM | POA: Insufficient documentation

## 2023-08-22 DIAGNOSIS — R634 Abnormal weight loss: Secondary | ICD-10-CM | POA: Diagnosis not present

## 2023-08-22 DIAGNOSIS — N946 Dysmenorrhea, unspecified: Secondary | ICD-10-CM | POA: Insufficient documentation

## 2023-08-22 DIAGNOSIS — Z79899 Other long term (current) drug therapy: Secondary | ICD-10-CM | POA: Insufficient documentation

## 2023-08-22 DIAGNOSIS — Z882 Allergy status to sulfonamides status: Secondary | ICD-10-CM | POA: Insufficient documentation

## 2023-08-22 DIAGNOSIS — K5900 Constipation, unspecified: Secondary | ICD-10-CM | POA: Diagnosis not present

## 2023-08-22 DIAGNOSIS — K219 Gastro-esophageal reflux disease without esophagitis: Secondary | ICD-10-CM | POA: Insufficient documentation

## 2023-08-22 DIAGNOSIS — Z807 Family history of other malignant neoplasms of lymphoid, hematopoietic and related tissues: Secondary | ICD-10-CM | POA: Insufficient documentation

## 2023-08-22 DIAGNOSIS — D696 Thrombocytopenia, unspecified: Secondary | ICD-10-CM | POA: Insufficient documentation

## 2023-08-22 DIAGNOSIS — F32A Depression, unspecified: Secondary | ICD-10-CM | POA: Insufficient documentation

## 2023-08-22 DIAGNOSIS — Z83518 Family history of other specified eye disorder: Secondary | ICD-10-CM | POA: Diagnosis not present

## 2023-08-22 DIAGNOSIS — R161 Splenomegaly, not elsewhere classified: Secondary | ICD-10-CM | POA: Diagnosis not present

## 2023-08-22 DIAGNOSIS — Z832 Family history of diseases of the blood and blood-forming organs and certain disorders involving the immune mechanism: Secondary | ICD-10-CM | POA: Diagnosis not present

## 2023-08-22 DIAGNOSIS — Z825 Family history of asthma and other chronic lower respiratory diseases: Secondary | ICD-10-CM | POA: Insufficient documentation

## 2023-08-22 DIAGNOSIS — Z818 Family history of other mental and behavioral disorders: Secondary | ICD-10-CM | POA: Diagnosis not present

## 2023-08-22 DIAGNOSIS — Z9089 Acquired absence of other organs: Secondary | ICD-10-CM | POA: Insufficient documentation

## 2023-08-22 DIAGNOSIS — E538 Deficiency of other specified B group vitamins: Secondary | ICD-10-CM | POA: Diagnosis not present

## 2023-08-22 DIAGNOSIS — Z881 Allergy status to other antibiotic agents status: Secondary | ICD-10-CM | POA: Diagnosis not present

## 2023-08-22 DIAGNOSIS — Z8249 Family history of ischemic heart disease and other diseases of the circulatory system: Secondary | ICD-10-CM | POA: Insufficient documentation

## 2023-08-22 DIAGNOSIS — Z833 Family history of diabetes mellitus: Secondary | ICD-10-CM | POA: Insufficient documentation

## 2023-08-22 LAB — COMPREHENSIVE METABOLIC PANEL
ALT: 12 U/L (ref 0–44)
AST: 30 U/L (ref 15–41)
Albumin: 4.1 g/dL (ref 3.5–5.0)
Alkaline Phosphatase: 58 U/L (ref 38–126)
Anion gap: 11 (ref 5–15)
BUN: 11 mg/dL (ref 8–23)
CO2: 24 mmol/L (ref 22–32)
Calcium: 9.3 mg/dL (ref 8.9–10.3)
Chloride: 103 mmol/L (ref 98–111)
Creatinine, Ser: 0.97 mg/dL (ref 0.44–1.00)
GFR, Estimated: >60 mL/min (ref >60)
Glucose, Bld: 123 mg/dL — ABNORMAL HIGH (ref 70–99)
Potassium: 4.1 mmol/L (ref 3.5–5.1)
Sodium: 138 mmol/L (ref 135–145)
Total Bilirubin: 1.8 mg/dL — ABNORMAL HIGH (ref 0.0–1.2)
Total Protein: 7.2 g/dL (ref 6.5–8.1)

## 2023-08-22 LAB — CBC WITH DIFFERENTIAL/PLATELET
Abs Immature Granulocytes: 0.5 10*3/uL — ABNORMAL HIGH (ref 0.00–0.07)
Band Neutrophils: 3 %
Basophils Absolute: 0 10*3/uL (ref 0.0–0.1)
Basophils Relative: 0 %
Eosinophils Absolute: 0 10*3/uL (ref 0.0–0.5)
Eosinophils Relative: 0 %
HCT: 30.4 % — ABNORMAL LOW (ref 36.0–46.0)
Hemoglobin: 9.5 g/dL — ABNORMAL LOW (ref 12.0–15.0)
Lymphocytes Relative: 19 %
Lymphs Abs: 1.1 10*3/uL (ref 0.7–4.0)
MCH: 30.6 pg (ref 26.0–34.0)
MCHC: 31.3 g/dL (ref 30.0–36.0)
MCV: 98.1 fL (ref 80.0–100.0)
Metamyelocytes Relative: 5 %
Monocytes Absolute: 0.4 10*3/uL (ref 0.1–1.0)
Monocytes Relative: 8 %
Myelocytes: 4 %
Neutro Abs: 3.6 10*3/uL (ref 1.7–7.7)
Neutrophils Relative %: 61 %
Platelets: 143 10*3/uL — ABNORMAL LOW (ref 150–400)
RBC: 3.1 MIL/uL — ABNORMAL LOW (ref 3.87–5.11)
RDW: 19.8 % — ABNORMAL HIGH (ref 11.5–15.5)
WBC: 5.6 10*3/uL (ref 4.0–10.5)
nRBC: 1.1 % — ABNORMAL HIGH (ref 0.0–0.2)
nRBC: 3 /100{WBCs} — ABNORMAL HIGH

## 2023-08-22 LAB — MAGNESIUM: Magnesium: 2.1 mg/dL (ref 1.7–2.4)

## 2023-08-22 LAB — LACTATE DEHYDROGENASE: LDH: 677 U/L — ABNORMAL HIGH (ref 98–192)

## 2023-08-22 MED ORDER — SODIUM CHLORIDE 0.9% FLUSH
10.0000 mL | Freq: Once | INTRAVENOUS | Status: AC
  Administered 2023-08-22: 10 mL via INTRAVENOUS

## 2023-08-22 MED ORDER — HEPARIN SOD (PORK) LOCK FLUSH 100 UNIT/ML IV SOLN
500.0000 [IU] | Freq: Once | INTRAVENOUS | Status: AC
Start: 2023-08-22 — End: 2023-08-22
  Administered 2023-08-22: 500 [IU] via INTRAVENOUS

## 2023-08-22 MED ORDER — SODIUM CHLORIDE 0.9 % IV SOLN
75.0000 mg/m2 | Freq: Once | INTRAVENOUS | Status: AC
  Administered 2023-08-22: 126 mg via INTRAVENOUS
  Filled 2023-08-22: qty 12.6

## 2023-08-22 MED ORDER — PALONOSETRON HCL INJECTION 0.25 MG/5ML
0.2500 mg | Freq: Once | INTRAVENOUS | Status: AC
  Administered 2023-08-22: 0.25 mg via INTRAVENOUS
  Filled 2023-08-22: qty 5

## 2023-08-22 MED ORDER — SODIUM CHLORIDE 0.9 % IV SOLN: Freq: Once | INTRAVENOUS | Status: AC

## 2023-08-22 NOTE — Patient Instructions (Signed)
 [facility] at [facility] Discharge Instructions   You were seen and examined today by Dr. Ellin Saba.  He reviewed the results of your lab work which are normal/stable.   We will proceed with your treatment today.  Return as scheduled.    Thank you for choosing [facility] at [facility] to provide your oncology and hematology care.  To afford each patient quality time with our provider, please arrive at least 15 minutes before your scheduled appointment time.   If you have a lab appointment with the Cancer Center please come in thru the Main Entrance and check in at the main information desk.  You need to re-schedule your appointment should you arrive 10 or more minutes late.  We strive to give you quality time with our providers, and arriving late affects you and other patients whose appointments are after yours.  Also, if you no show three or more times for appointments you may be dismissed from the clinic at the providers discretion.     Again, thank you for choosing [facility].  Our hope is that these requests will decrease the amount of time that you wait before being seen by our physicians.       _____________________________________________________________  Should you have questions after your visit to [facility], please contact our office at [phone] and follow the prompts.  Our office hours are 8:00 a.m. and 4:30 p.m. Monday - Friday.  Please note that voicemails left after 4:00 p.m. may not be returned until the following business day.  We are closed weekends and major holidays.  You do have access to a nurse 24-7, just call the main number to the clinic [phone] and do not press any options, hold on the line and a nurse will answer the phone.    For prescription refill requests, have your pharmacy contact our office and allow 72 hours.    Due to Covid, you will need to wear a mask upon entering  the hospital. If you do not have a mask, a mask will be given to you at the Main Entrance upon arrival. For doctor visits, patients may have 1 support person age 67 or older with them. For treatment visits, patients can not have anyone with them due to social distancing guidelines and our immunocompromised population.

## 2023-08-22 NOTE — Patient Instructions (Signed)
[facility] [place] - A DEPT OF [facility]  Discharge Instructions: Thank you for choosing [facility] to provide your oncology and hematology care.  If you have a lab appointment with the Cancer Center - please note that after April 8th, 2024, all labs will be drawn in the cancer center.  You do not have to check in or register with the main entrance as you have in the past but will complete your check-in in the cancer center.  Wear comfortable clothing and clothing appropriate for easy access to any Portacath or PICC line.   We strive to give you quality time with your provider. You may need to reschedule your appointment if you arrive late (15 or more minutes).  Arriving late affects you and other patients whose appointments are after yours.  Also, if you miss three or more appointments without notifying the office, you may be dismissed from the clinic at the provider's discretion.      For prescription refill requests, have your pharmacy contact our office and allow 72 hours for refills to be completed.    Today you received the following chemotherapy and/or immunotherapy agents Vidaza.  Azacitidine Injection What is this medication? AZACITIDINE (ay za SITE i deen) treats blood and bone marrow cancers. It works by slowing down the growth of cancer cells. This medicine may be used for other purposes; ask your health care provider or pharmacist if you have questions. COMMON BRAND NAME(S): Vidaza What should I tell my care team before I take this medication? They need to know if you have any of these conditions: Kidney disease Liver disease Low blood cell levels, such as low white cells, platelets, or red blood cells Low levels of albumin in the blood Low levels of bicarbonate in the blood An unusual or allergic reaction to azacitidine, mannitol, other medications, foods, dyes, or preservatives If you or your partner are pregnant or trying to get  pregnant Breast-feeding How should I use this medication? This medication is injected into a vein or under the skin. It is given by your care team in a hospital or clinic setting. Talk to your care team about the use of this medication in children. While it may be prescribed for children as young as 1 month for selected conditions, precautions do apply. Overdosage: If you think you have taken too much of this medicine contact a poison control center or emergency room at once. NOTE: This medicine is only for you. Do not share this medicine with others. What if I miss a dose? Keep appointments for follow-up doses. It is important not to miss your dose. Call your care team if you are unable to keep an appointment. What may interact with this medication? Interactions are not expected. This list may not describe all possible interactions. Give your health care provider a list of all the medicines, herbs, non-prescription drugs, or dietary supplements you use. Also tell them if you smoke, drink alcohol, or use illegal drugs. Some items may interact with your medicine. What should I watch for while using this medication? Your condition will be monitored carefully while you are receiving this medication. This medication may make you feel generally unwell. This is not uncommon as chemotherapy can affect healthy cells as well as cancer cells. Report any side effects. Continue your course of treatment even though you feel ill unless your care team tells you to stop. You may need blood work done while you are  taking this medication. Other product types may be available that contain the medication azacitidine. The injection and oral products should not be used in place of one another. Talk to your care team if you have questions. This medication can cause serious side effects. To reduce the risk, your care team may give you other medications to take before receiving this one. Be sure to follow the directions from  your care team. This medication may increase your risk of getting an infection. Call your care team for advice if you get a fever, chills, sore throat, or other symptoms of a cold or flu. Do not treat yourself. Try to avoid being around people who are sick. Avoid taking medications that contain aspirin, acetaminophen, ibuprofen, naproxen, or ketoprofen unless instructed by your care team. These medications may hide a fever. Be careful brushing or flossing your teeth or using a toothpick because you may get an infection or bleed more easily. If you have any dental work done, tell your dentist you are receiving this medication. Talk to your care team if you or your partner may be pregnant. Serious birth defects can occur if you take this medication during pregnancy and for 6 months after the last dose. You will need a negative pregnancy test before starting this medication. Contraception is recommended while taking this medication and for 6 months after the last dose. Your care team can help you find the option that works for you. If your partner can get pregnant, use a condom during sex while taking this medication and for 3 months after the last dose. Do not breastfeed while taking this medication and for 1 week after the last dose. This medication may cause infertility. Talk to your care team if you are concerned about your fertility. What side effects may I notice from receiving this medication? Side effects that you should report to your care team as soon as possible: Allergic reactions--skin rash, itching, hives, swelling of the face, lips, tongue, or throat Infection--fever, chills, cough, sore throat, wounds that don't heal, pain or trouble when passing urine, general feeling of discomfort or being unwell Kidney injury--decrease in the amount of urine, swelling of the ankles, hands, or feet Liver injury--right upper belly pain, loss of appetite, nausea, light-colored stool, dark yellow or [name]  urine, yellowing skin or eyes, unusual weakness or fatigue Low red blood cell level--unusual weakness or fatigue, dizziness, headache, trouble breathing Tumor lysis syndrome (TLS)--nausea, vomiting, diarrhea, decrease in the amount of urine, dark urine, unusual weakness or fatigue, confusion, muscle pain or cramps, fast or irregular heartbeat, joint pain Unusual bruising or bleeding Side effects that usually do not require medical attention (report to your care team if they continue or are bothersome): Constipation Diarrhea Nausea Pain, redness, or irritation at injection site Vomiting This list may not describe all possible side effects. Call your doctor for medical advice about side effects. You may report side effects to FDA at 1-800-FDA-1088. Where should I keep my medication? This medication is given in a hospital or clinic. It will not be stored at home. NOTE: This sheet is a summary. It may not cover all possible information. If you have questions about this medicine, talk to your doctor, pharmacist, or health care provider.  2024 Elsevier/Gold Standard (2023-03-07 00:00:00)       To help prevent nausea and vomiting after your treatment, we encourage you to take your nausea medication as directed.  BELOW ARE SYMPTOMS THAT SHOULD BE REPORTED IMMEDIATELY: *FEVER GREATER THAN 100.4  F (38 C) OR HIGHER *CHILLS OR SWEATING *NAUSEA AND VOMITING THAT IS NOT CONTROLLED WITH YOUR NAUSEA MEDICATION *UNUSUAL SHORTNESS OF BREATH *UNUSUAL BRUISING OR BLEEDING *URINARY PROBLEMS (pain or burning when urinating, or frequent urination) *BOWEL PROBLEMS (unusual diarrhea, constipation, pain near the anus) TENDERNESS IN MOUTH AND THROAT WITH OR WITHOUT PRESENCE OF ULCERS (sore throat, sores in mouth, or a toothache) UNUSUAL RASH, SWELLING OR PAIN  UNUSUAL VAGINAL DISCHARGE OR ITCHING   Items with * indicate a potential emergency and should be followed up as soon as possible or go to the Emergency  Department if any problems should occur.  Please show the CHEMOTHERAPY ALERT CARD or IMMUNOTHERAPY ALERT CARD at check-in to the Emergency Department and triage nurse.  Should you have questions after your visit or need to cancel or reschedule your appointment, please contact [facility] [place] - A DEPT OF [facility] [phone]  and follow the prompts.  Office hours are 8:00 a.m. to 4:30 p.m. Monday - Friday. Please note that voicemails left after 4:00 p.m. may not be returned until the following business day.  We are closed weekends and major holidays. You have access to a nurse at all times for urgent questions. Please call the main number to the clinic [phone] and follow the prompts.  For any non-urgent questions, you may also contact your provider using MyChart. We now offer e-Visits for anyone 88 and older to request care online for non-urgent symptoms. For details visit [email or web address].   Also download the MyChart app! Go to the app store, search "MyChart", open the app, select [place], and log in with your MyChart username and password.

## 2023-08-22 NOTE — Progress Notes (Signed)
Patient presents today for chemotherapy infusion. Patient is in satisfactory condition with no new complaints voiced.  Vital signs are stable.  Labs reviewed by Dr. Ellin Saba during the office visit.  Bilirubin is 1.8 today. MD aware.  All other labs are within treatment parameters.  We will proceed with treatment per MD orders.   Patient tolerated treatment well with no complaints voiced.  Patient left ambulatory in stable condition.  Vital signs stable at discharge.  Follow up as scheduled.

## 2023-08-22 NOTE — Progress Notes (Signed)
Patient is taking venetoclax as prescribed. She has not missed any doses and reports no side effects at this time.    Patient has been examined by Dr. Ellin Saba. Vital signs and labs have been reviewed by MD - ANC, Creatinine, LFTs, hemoglobin, and platelets are within treatment parameters per M.D. - pt may proceed with treatment.  Primary RN and pharmacy notified.

## 2023-08-23 ENCOUNTER — Inpatient Hospital Stay: Payer: Medicare HMO

## 2023-08-23 VITALS — BP 104/72 | HR 83 | Temp 98.5°F | Resp 18

## 2023-08-23 DIAGNOSIS — D539 Nutritional anemia, unspecified: Secondary | ICD-10-CM | POA: Diagnosis not present

## 2023-08-23 DIAGNOSIS — C9310 Chronic myelomonocytic leukemia not having achieved remission: Secondary | ICD-10-CM | POA: Diagnosis not present

## 2023-08-23 DIAGNOSIS — D696 Thrombocytopenia, unspecified: Secondary | ICD-10-CM | POA: Diagnosis not present

## 2023-08-23 DIAGNOSIS — Z5111 Encounter for antineoplastic chemotherapy: Secondary | ICD-10-CM | POA: Diagnosis not present

## 2023-08-23 DIAGNOSIS — N946 Dysmenorrhea, unspecified: Secondary | ICD-10-CM | POA: Diagnosis not present

## 2023-08-23 DIAGNOSIS — K5900 Constipation, unspecified: Secondary | ICD-10-CM | POA: Diagnosis not present

## 2023-08-23 DIAGNOSIS — R161 Splenomegaly, not elsewhere classified: Secondary | ICD-10-CM | POA: Diagnosis not present

## 2023-08-23 DIAGNOSIS — E538 Deficiency of other specified B group vitamins: Secondary | ICD-10-CM | POA: Diagnosis not present

## 2023-08-23 DIAGNOSIS — F32A Depression, unspecified: Secondary | ICD-10-CM | POA: Diagnosis not present

## 2023-08-23 DIAGNOSIS — Z95828 Presence of other vascular implants and grafts: Secondary | ICD-10-CM

## 2023-08-23 MED ORDER — SODIUM CHLORIDE 0.9% FLUSH
10.0000 mL | Freq: Once | INTRAVENOUS | Status: AC
  Administered 2023-08-23: 10 mL via INTRAVENOUS

## 2023-08-23 MED ORDER — HEPARIN SOD (PORK) LOCK FLUSH 100 UNIT/ML IV SOLN
500.0000 [IU] | Freq: Once | INTRAVENOUS | Status: AC
  Administered 2023-08-23: 500 [IU] via INTRAVENOUS

## 2023-08-23 MED ORDER — SODIUM CHLORIDE 0.9 % IV SOLN
Freq: Once | INTRAVENOUS | Status: AC
Start: 2023-08-23 — End: 2023-08-23

## 2023-08-23 MED ORDER — AZACITIDINE CHEMO INJECTION 100 MG
75.0000 mg/m2 | Freq: Once | INTRAMUSCULAR | Status: AC
  Administered 2023-08-23: 126 mg via INTRAVENOUS
  Filled 2023-08-23: qty 12.6

## 2023-08-23 NOTE — Progress Notes (Signed)
 Patient presents today for chemotherapy infusion.  Patient c/o low grade temp, cough and congestion that started last night.  Dr. Katragadda made aware.  Vital signs are stable.  Labs from 08/22/23 reviewed.  We will proceed with treatment per MD orders.    Patient tolerated treatment well with no complaints voiced.  Patient left ambulatory in stable condition.  Vital signs stable at discharge.  Follow up as scheduled.

## 2023-08-23 NOTE — Patient Instructions (Signed)
 [facility] [place] - A DEPT OF [facility]  Discharge Instructions: Thank you for choosing [facility] to provide your oncology and hematology care.  If you have a lab appointment with the Cancer Center - please note that after April 8th, 2024, all labs will be drawn in the cancer center.  You do not have to check in or register with the main entrance as you have in the past but will complete your check-in in the cancer center.  Wear comfortable clothing and clothing appropriate for easy access to any Portacath or PICC line.   We strive to give you quality time with your provider. You may need to reschedule your appointment if you arrive late (15 or more minutes).  Arriving late affects you and other patients whose appointments are after yours.  Also, if you miss three or more appointments without notifying the office, you may be dismissed from the clinic at the provider's discretion.      For prescription refill requests, have your pharmacy contact our office and allow 72 hours for refills to be completed.    Today you received the following chemotherapy and/or immunotherapy agents Vidaza.  Azacitidine Injection What is this medication? AZACITIDINE (ay za SITE i deen) treats blood and bone marrow cancers. It works by slowing down the growth of cancer cells. This medicine may be used for other purposes; ask your health care provider or pharmacist if you have questions. COMMON BRAND NAME(S): Vidaza What should I tell my care team before I take this medication? They need to know if you have any of these conditions: Kidney disease Liver disease Low blood cell levels, such as low white cells, platelets, or red blood cells Low levels of albumin in the blood Low levels of bicarbonate in the blood An unusual or allergic reaction to azacitidine, mannitol, other medications, foods, dyes, or preservatives If you or your partner are pregnant or trying to get  pregnant Breast-feeding How should I use this medication? This medication is injected into a vein or under the skin. It is given by your care team in a hospital or clinic setting. Talk to your care team about the use of this medication in children. While it may be prescribed for children as young as 1 month for selected conditions, precautions do apply. Overdosage: If you think you have taken too much of this medicine contact a poison control center or emergency room at once. NOTE: This medicine is only for you. Do not share this medicine with others. What if I miss a dose? Keep appointments for follow-up doses. It is important not to miss your dose. Call your care team if you are unable to keep an appointment. What may interact with this medication? Interactions are not expected. This list may not describe all possible interactions. Give your health care provider a list of all the medicines, herbs, non-prescription drugs, or dietary supplements you use. Also tell them if you smoke, drink alcohol, or use illegal drugs. Some items may interact with your medicine. What should I watch for while using this medication? Your condition will be monitored carefully while you are receiving this medication. This medication may make you feel generally unwell. This is not uncommon as chemotherapy can affect healthy cells as well as cancer cells. Report any side effects. Continue your course of treatment even though you feel ill unless your care team tells you to stop. You may need blood work done while you are  taking this medication. Other product types may be available that contain the medication azacitidine. The injection and oral products should not be used in place of one another. Talk to your care team if you have questions. This medication can cause serious side effects. To reduce the risk, your care team may give you other medications to take before receiving this one. Be sure to follow the directions from  your care team. This medication may increase your risk of getting an infection. Call your care team for advice if you get a fever, chills, sore throat, or other symptoms of a cold or flu. Do not treat yourself. Try to avoid being around people who are sick. Avoid taking medications that contain aspirin, acetaminophen, ibuprofen, naproxen, or ketoprofen unless instructed by your care team. These medications may hide a fever. Be careful brushing or flossing your teeth or using a toothpick because you may get an infection or bleed more easily. If you have any dental work done, tell your dentist you are receiving this medication. Talk to your care team if you or your partner may be pregnant. Serious birth defects can occur if you take this medication during pregnancy and for 6 months after the last dose. You will need a negative pregnancy test before starting this medication. Contraception is recommended while taking this medication and for 6 months after the last dose. Your care team can help you find the option that works for you. If your partner can get pregnant, use a condom during sex while taking this medication and for 3 months after the last dose. Do not breastfeed while taking this medication and for 1 week after the last dose. This medication may cause infertility. Talk to your care team if you are concerned about your fertility. What side effects may I notice from receiving this medication? Side effects that you should report to your care team as soon as possible: Allergic reactions--skin rash, itching, hives, swelling of the face, lips, tongue, or throat Infection--fever, chills, cough, sore throat, wounds that don't heal, pain or trouble when passing urine, general feeling of discomfort or being unwell Kidney injury--decrease in the amount of urine, swelling of the ankles, hands, or feet Liver injury--right upper belly pain, loss of appetite, nausea, light-colored stool, dark yellow or [name]  urine, yellowing skin or eyes, unusual weakness or fatigue Low red blood cell level--unusual weakness or fatigue, dizziness, headache, trouble breathing Tumor lysis syndrome (TLS)--nausea, vomiting, diarrhea, decrease in the amount of urine, dark urine, unusual weakness or fatigue, confusion, muscle pain or cramps, fast or irregular heartbeat, joint pain Unusual bruising or bleeding Side effects that usually do not require medical attention (report to your care team if they continue or are bothersome): Constipation Diarrhea Nausea Pain, redness, or irritation at injection site Vomiting This list may not describe all possible side effects. Call your doctor for medical advice about side effects. You may report side effects to FDA at 1-800-FDA-1088. Where should I keep my medication? This medication is given in a hospital or clinic. It will not be stored at home. NOTE: This sheet is a summary. It may not cover all possible information. If you have questions about this medicine, talk to your doctor, pharmacist, or health care provider.  2024 Elsevier/Gold Standard (2023-03-07 00:00:00)       To help prevent nausea and vomiting after your treatment, we encourage you to take your nausea medication as directed.  BELOW ARE SYMPTOMS THAT SHOULD BE REPORTED IMMEDIATELY: *FEVER GREATER THAN 100.4  F (38 C) OR HIGHER *CHILLS OR SWEATING *NAUSEA AND VOMITING THAT IS NOT CONTROLLED WITH YOUR NAUSEA MEDICATION *UNUSUAL SHORTNESS OF BREATH *UNUSUAL BRUISING OR BLEEDING *URINARY PROBLEMS (pain or burning when urinating, or frequent urination) *BOWEL PROBLEMS (unusual diarrhea, constipation, pain near the anus) TENDERNESS IN MOUTH AND THROAT WITH OR WITHOUT PRESENCE OF ULCERS (sore throat, sores in mouth, or a toothache) UNUSUAL RASH, SWELLING OR PAIN  UNUSUAL VAGINAL DISCHARGE OR ITCHING   Items with * indicate a potential emergency and should be followed up as soon as possible or go to the Emergency  Department if any problems should occur.  Please show the CHEMOTHERAPY ALERT CARD or IMMUNOTHERAPY ALERT CARD at check-in to the Emergency Department and triage nurse.  Should you have questions after your visit or need to cancel or reschedule your appointment, please contact [facility] [place] - A DEPT OF [facility] [phone]  and follow the prompts.  Office hours are 8:00 a.m. to 4:30 p.m. Monday - Friday. Please note that voicemails left after 4:00 p.m. may not be returned until the following business day.  We are closed weekends and major holidays. You have access to a nurse at all times for urgent questions. Please call the main number to the clinic [phone] and follow the prompts.  For any non-urgent questions, you may also contact your provider using MyChart. We now offer e-Visits for anyone 88 and older to request care online for non-urgent symptoms. For details visit [email or web address].   Also download the MyChart app! Go to the app store, search "MyChart", open the app, select [place], and log in with your MyChart username and password.

## 2023-08-23 NOTE — Progress Notes (Signed)
she called her PCP last night, she could stop coughing, they called her in a zpak-steroids and hydrocodone cough medicine temp 99.3.   MD notified , ok to treat today.

## 2023-08-24 ENCOUNTER — Inpatient Hospital Stay: Payer: Medicare HMO

## 2023-08-24 VITALS — BP 107/64 | HR 72 | Temp 97.7°F | Resp 18

## 2023-08-24 DIAGNOSIS — K5900 Constipation, unspecified: Secondary | ICD-10-CM | POA: Diagnosis not present

## 2023-08-24 DIAGNOSIS — E538 Deficiency of other specified B group vitamins: Secondary | ICD-10-CM | POA: Diagnosis not present

## 2023-08-24 DIAGNOSIS — Z95828 Presence of other vascular implants and grafts: Secondary | ICD-10-CM

## 2023-08-24 DIAGNOSIS — F32A Depression, unspecified: Secondary | ICD-10-CM | POA: Diagnosis not present

## 2023-08-24 DIAGNOSIS — C9310 Chronic myelomonocytic leukemia not having achieved remission: Secondary | ICD-10-CM

## 2023-08-24 DIAGNOSIS — D539 Nutritional anemia, unspecified: Secondary | ICD-10-CM | POA: Diagnosis not present

## 2023-08-24 DIAGNOSIS — Z5111 Encounter for antineoplastic chemotherapy: Secondary | ICD-10-CM | POA: Diagnosis not present

## 2023-08-24 DIAGNOSIS — D696 Thrombocytopenia, unspecified: Secondary | ICD-10-CM | POA: Diagnosis not present

## 2023-08-24 DIAGNOSIS — R161 Splenomegaly, not elsewhere classified: Secondary | ICD-10-CM | POA: Diagnosis not present

## 2023-08-24 DIAGNOSIS — N946 Dysmenorrhea, unspecified: Secondary | ICD-10-CM | POA: Diagnosis not present

## 2023-08-24 MED ORDER — SODIUM CHLORIDE 0.9 % IV SOLN
Freq: Once | INTRAVENOUS | Status: AC
Start: 2023-08-24 — End: 2023-08-24

## 2023-08-24 MED ORDER — HEPARIN SOD (PORK) LOCK FLUSH 100 UNIT/ML IV SOLN
500.0000 [IU] | Freq: Once | INTRAVENOUS | Status: AC
  Administered 2023-08-24: 500 [IU] via INTRAVENOUS

## 2023-08-24 MED ORDER — PALONOSETRON HCL INJECTION 0.25 MG/5ML
0.2500 mg | Freq: Once | INTRAVENOUS | Status: AC
  Administered 2023-08-24: 0.25 mg via INTRAVENOUS
  Filled 2023-08-24: qty 5

## 2023-08-24 MED ORDER — AZACITIDINE CHEMO INJECTION 100 MG
75.0000 mg/m2 | Freq: Once | INTRAMUSCULAR | Status: AC
  Administered 2023-08-24: 126 mg via INTRAVENOUS
  Filled 2023-08-24: qty 12.6

## 2023-08-24 MED ORDER — SODIUM CHLORIDE 0.9% FLUSH
10.0000 mL | Freq: Once | INTRAVENOUS | Status: AC
  Administered 2023-08-24: 10 mL via INTRAVENOUS

## 2023-08-24 NOTE — Patient Instructions (Signed)
 [facility] [place] - [facility]  Discharge Instructions: Thank you for choosing [facility] to provide your oncology and hematology care.  If you have a lab appointment with the Cancer Center - please note that after April 8th, 2024, all labs will be drawn in the cancer center.  You do not have to check in or register with the main entrance as you have in the past but will complete your check-in in the cancer center.  Wear comfortable clothing and clothing appropriate for easy access to any Portacath or PICC line.   We strive to give you quality time with your provider. You may need to reschedule your appointment if you arrive late (15 or more minutes).  Arriving late affects you and other patients whose appointments are after yours.  Also, if you miss three or more appointments without notifying the office, you may be dismissed from the clinic at the provider's discretion.      For prescription refill requests, have your pharmacy contact our office and allow 72 hours for refills to be completed.    Today you received the following chemotherapy and/or immunotherapy agents Vidaza.  Azacitidine Injection What is this medication? AZACITIDINE (ay za SITE i deen) treats blood and bone marrow cancers. It works by slowing down the growth of cancer cells. This medicine may be used for other purposes; ask your health care provider or pharmacist if you have questions. COMMON BRAND NAME(S): Vidaza What should I tell my care team before I take this medication? They need to know if you have any of these conditions: Kidney disease Liver disease Low blood cell levels, such as low white cells, platelets, or red blood cells Low levels of albumin in the blood Low levels of bicarbonate in the blood An unusual or allergic reaction to azacitidine, mannitol, other medications, foods, dyes, or preservatives If you or your partner are pregnant or trying to get  pregnant Breast-feeding How should I use this medication? This medication is injected into a vein or under the skin. It is given by your care team in a hospital or clinic setting. Talk to your care team about the use of this medication in children. While it may be prescribed for children as young as 1 month for selected conditions, precautions do apply. Overdosage: If you think you have taken too much of this medicine contact a poison control center or emergency room at once. NOTE: This medicine is only for you. Do not share this medicine with others. What if I miss a dose? Keep appointments for follow-up doses. It is important not to miss your dose. Call your care team if you are unable to keep an appointment. What may interact with this medication? Interactions are not expected. This list may not describe all possible interactions. Give your health care provider a list of all the medicines, herbs, non-prescription drugs, or dietary supplements you use. Also tell them if you smoke, drink alcohol, or use illegal drugs. Some items may interact with your medicine. What should I watch for while using this medication? Your condition will be monitored carefully while you are receiving this medication. This medication may make you feel generally unwell. This is not uncommon as chemotherapy can affect healthy cells as well as cancer cells. Report any side effects. Continue your course of treatment even though you feel ill unless your care team tells you to stop. You may need blood work done while you are  taking this medication. Other product types may be available that contain the medication azacitidine. The injection and oral products should not be used in place of one another. Talk to your care team if you have questions. This medication can cause serious side effects. To reduce the risk, your care team may give you other medications to take before receiving this one. Be sure to follow the directions from  your care team. This medication may increase your risk of getting an infection. Call your care team for advice if you get a fever, chills, sore throat, or other symptoms of a cold or flu. Do not treat yourself. Try to avoid being around people who are sick. Avoid taking medications that contain aspirin, acetaminophen, ibuprofen, naproxen, or ketoprofen unless instructed by your care team. These medications may hide a fever. Be careful brushing or flossing your teeth or using a toothpick because you may get an infection or bleed more easily. If you have any dental work done, tell your dentist you are receiving this medication. Talk to your care team if you or your partner may be pregnant. Serious birth defects can occur if you take this medication during pregnancy and for 6 months after the last dose. You will need a negative pregnancy test before starting this medication. Contraception is recommended while taking this medication and for 6 months after the last dose. Your care team can help you find the option that works for you. If your partner can get pregnant, use a condom during sex while taking this medication and for 3 months after the last dose. Do not breastfeed while taking this medication and for 1 week after the last dose. This medication may cause infertility. Talk to your care team if you are concerned about your fertility. What side effects may I notice from receiving this medication? Side effects that you should report to your care team as soon as possible: Allergic reactions--skin rash, itching, hives, swelling of the face, lips, tongue, or throat Infection--fever, chills, cough, sore throat, wounds that don't heal, pain or trouble when passing urine, general feeling of discomfort or being unwell Kidney injury--decrease in the amount of urine, swelling of the ankles, hands, or feet Liver injury--right upper belly pain, loss of appetite, nausea, light-colored stool, dark yellow or Iyla Balzarini  urine, yellowing skin or eyes, unusual weakness or fatigue Low red blood cell level--unusual weakness or fatigue, dizziness, headache, trouble breathing Tumor lysis syndrome (TLS)--nausea, vomiting, diarrhea, decrease in the amount of urine, dark urine, unusual weakness or fatigue, confusion, muscle pain or cramps, fast or irregular heartbeat, joint pain Unusual bruising or bleeding Side effects that usually do not require medical attention (report to your care team if they continue or are bothersome): Constipation Diarrhea Nausea Pain, redness, or irritation at injection site Vomiting This list may not describe all possible side effects. Call your doctor for medical advice about side effects. You may report side effects to FDA at 1-800-FDA-1088. Where should I keep my medication? This medication is given in a hospital or clinic. It will not be stored at home. NOTE: This sheet is a summary. It may not cover all possible information. If you have questions about this medicine, talk to your doctor, pharmacist, or health care provider.  2024 Elsevier/Gold Standard (2023-03-07 00:00:00)       To help prevent nausea and vomiting after your treatment, we encourage you to take your nausea medication as directed.  BELOW ARE SYMPTOMS THAT SHOULD BE REPORTED IMMEDIATELY: *FEVER GREATER THAN 100.4  F (38 C) OR HIGHER *CHILLS OR SWEATING *NAUSEA AND VOMITING THAT IS NOT CONTROLLED WITH YOUR NAUSEA MEDICATION *UNUSUAL SHORTNESS OF BREATH *UNUSUAL BRUISING OR BLEEDING *URINARY PROBLEMS (pain or burning when urinating, or frequent urination) *BOWEL PROBLEMS (unusual diarrhea, constipation, pain near the anus) TENDERNESS IN MOUTH AND THROAT WITH OR WITHOUT PRESENCE OF ULCERS (sore throat, sores in mouth, or a toothache) UNUSUAL RASH, SWELLING OR PAIN  UNUSUAL VAGINAL DISCHARGE OR ITCHING   Items with * indicate a potential emergency and should be followed up as soon as possible or go to the Emergency  Department if any problems should occur.  Please show the CHEMOTHERAPY ALERT CARD or IMMUNOTHERAPY ALERT CARD at check-in to the Emergency Department and triage nurse.  Should you have questions after your visit or need to cancel or reschedule your appointment, please contact [facility] CANCER CTR [place] - A DEPT OF Eligha Bridegroom [facility] [phone]  and follow the prompts.  Office hours are 8:00 a.m. to 4:30 p.m. Monday - Friday. Please note that voicemails left after 4:00 p.m. may not be returned until the following business day.  We are closed weekends and major holidays. You have access to a nurse at all times for urgent questions. Please call the main number to the clinic [phone] and follow the prompts.  For any non-urgent questions, you may also contact your provider using MyChart. We now offer e-Visits for anyone 88 and older to request care online for non-urgent symptoms. For details visit [email or web address].   Also download the MyChart app! Go to the app store, search "MyChart", open the app, select [place], and log in with your MyChart username and password.

## 2023-08-24 NOTE — Progress Notes (Signed)
 Patient presents today for chemotherapy infusion.  Patient is in satisfactory condition with no new complaints voiced.  Vital signs are stable.  Labs from 06/20/24 reviewed. We will proceed with treatment per MD orders.    Patient tolerated treatment well with no complaints voiced.  Patient left ambulatory in stable condition.  Vital signs stable at discharge.  Follow up as scheduled.

## 2023-08-25 ENCOUNTER — Inpatient Hospital Stay: Payer: Medicare HMO

## 2023-08-25 VITALS — BP 99/54 | HR 71 | Temp 96.0°F | Resp 18

## 2023-08-25 DIAGNOSIS — D696 Thrombocytopenia, unspecified: Secondary | ICD-10-CM | POA: Diagnosis not present

## 2023-08-25 DIAGNOSIS — F32A Depression, unspecified: Secondary | ICD-10-CM | POA: Diagnosis not present

## 2023-08-25 DIAGNOSIS — Z95828 Presence of other vascular implants and grafts: Secondary | ICD-10-CM

## 2023-08-25 DIAGNOSIS — Z5111 Encounter for antineoplastic chemotherapy: Secondary | ICD-10-CM | POA: Diagnosis not present

## 2023-08-25 DIAGNOSIS — R161 Splenomegaly, not elsewhere classified: Secondary | ICD-10-CM | POA: Diagnosis not present

## 2023-08-25 DIAGNOSIS — K5900 Constipation, unspecified: Secondary | ICD-10-CM | POA: Diagnosis not present

## 2023-08-25 DIAGNOSIS — N946 Dysmenorrhea, unspecified: Secondary | ICD-10-CM | POA: Diagnosis not present

## 2023-08-25 DIAGNOSIS — D539 Nutritional anemia, unspecified: Secondary | ICD-10-CM | POA: Diagnosis not present

## 2023-08-25 DIAGNOSIS — E538 Deficiency of other specified B group vitamins: Secondary | ICD-10-CM | POA: Diagnosis not present

## 2023-08-25 DIAGNOSIS — C9310 Chronic myelomonocytic leukemia not having achieved remission: Secondary | ICD-10-CM

## 2023-08-25 MED ORDER — SODIUM CHLORIDE 0.9 % IV SOLN: Freq: Once | INTRAVENOUS | Status: AC

## 2023-08-25 MED ORDER — SODIUM CHLORIDE 0.9 % IV SOLN
75.0000 mg/m2 | Freq: Once | INTRAVENOUS | Status: AC
  Administered 2023-08-25: 126 mg via INTRAVENOUS
  Filled 2023-08-25: qty 12.6

## 2023-08-25 MED ORDER — HEPARIN SOD (PORK) LOCK FLUSH 100 UNIT/ML IV SOLN
500.0000 [IU] | Freq: Once | INTRAVENOUS | Status: AC
  Administered 2023-08-25: 500 [IU] via INTRAVENOUS

## 2023-08-25 NOTE — Patient Instructions (Signed)
 [facility] [place] [facility]  Discharge Instructions: Thank you for choosing [facility] to provide your oncology and hematology care.  If you have a lab appointment with the Cancer Center - please note that after April 8th, 2024, all labs will be drawn in the cancer center.  You do not have to check in or register with the main entrance as you have in the past but will complete your check-in in the cancer center.  Wear comfortable clothing and clothing appropriate for easy access to any Portacath or PICC line.   We strive to give you quality time with your provider. You may need to reschedule your appointment if you arrive late (15 or more minutes).  Arriving late affects you and other patients whose appointments are after yours.  Also, if you miss three or more appointments without notifying the office, you may be dismissed from the clinic at the provider's discretion.      For prescription refill requests, have your pharmacy contact our office and allow 72 hours for refills to be completed.    Today you received the following chemotherapy and/or immunotherapy agents vidaza.       To help prevent nausea and vomiting after your treatment, we encourage you to take your nausea medication as directed.  BELOW ARE SYMPTOMS THAT SHOULD BE REPORTED IMMEDIATELY: *FEVER GREATER THAN 100.4 F (38 C) OR HIGHER *CHILLS OR SWEATING *NAUSEA AND VOMITING THAT IS NOT CONTROLLED WITH YOUR NAUSEA MEDICATION *UNUSUAL SHORTNESS OF BREATH *UNUSUAL BRUISING OR BLEEDING *URINARY PROBLEMS (pain or burning when urinating, or frequent urination) *BOWEL PROBLEMS (unusual diarrhea, constipation, pain near the anus) TENDERNESS IN MOUTH AND THROAT WITH OR WITHOUT PRESENCE OF ULCERS (sore throat, sores in mouth, or a toothache) UNUSUAL RASH, SWELLING OR PAIN  UNUSUAL VAGINAL DISCHARGE OR ITCHING   Items with * indicate a potential emergency and should be followed up  as soon as possible or go to the Emergency Department if any problems should occur.  Please show the CHEMOTHERAPY ALERT CARD or IMMUNOTHERAPY ALERT CARD at check-in to the Emergency Department and triage nurse.  Should you have questions after your visit or need to cancel or reschedule your appointment, please contact [facility] CANCER CTR [place] - A DEPT OF [facility] Bridegroom [facility] [phone]  and follow the prompts.  Office hours are 8:00 a.m. to 4:30 p.m. Monday - Friday. Please note that voicemails left after 4:00 p.m. may not be returned until the following business day.  We are closed weekends and major holidays. You have access to a nurse at all times for urgent questions. Please call the main number to the clinic [phone] and follow the prompts.  For any non-urgent questions, you may also contact your provider using MyChart. We now offer e-Visits for anyone 8 and older to request care online for non-urgent symptoms. For details visit [email or web address].   Also download the MyChart app! Go to the app store, search "MyChart", open the app, select [place], and log in with your MyChart username and password.

## 2023-08-25 NOTE — Progress Notes (Signed)
 Patient tolerated chemotherapy with no complaints voiced.  Side effects with management reviewed with understanding verbalized.  Port site clean and dry with no bruising or swelling noted at site.  Good blood return noted before and after administration of chemotherapy.  Port left accessed per pt request, pt due to return to clinic 08/26/23 for treatment. Patient left in satisfactory condition with VSS and no s/s of distress noted. All follow ups as scheduled.   [name]

## 2023-08-26 ENCOUNTER — Encounter: Payer: Self-pay | Admitting: Hematology

## 2023-08-26 ENCOUNTER — Inpatient Hospital Stay: Payer: Medicare HMO

## 2023-08-26 VITALS — BP 97/62 | HR 74 | Temp 97.3°F | Resp 18

## 2023-08-26 DIAGNOSIS — D539 Nutritional anemia, unspecified: Secondary | ICD-10-CM | POA: Diagnosis not present

## 2023-08-26 DIAGNOSIS — R161 Splenomegaly, not elsewhere classified: Secondary | ICD-10-CM | POA: Diagnosis not present

## 2023-08-26 DIAGNOSIS — N946 Dysmenorrhea, unspecified: Secondary | ICD-10-CM | POA: Diagnosis not present

## 2023-08-26 DIAGNOSIS — E538 Deficiency of other specified B group vitamins: Secondary | ICD-10-CM | POA: Diagnosis not present

## 2023-08-26 DIAGNOSIS — C9310 Chronic myelomonocytic leukemia not having achieved remission: Secondary | ICD-10-CM

## 2023-08-26 DIAGNOSIS — F32A Depression, unspecified: Secondary | ICD-10-CM | POA: Diagnosis not present

## 2023-08-26 DIAGNOSIS — D696 Thrombocytopenia, unspecified: Secondary | ICD-10-CM | POA: Diagnosis not present

## 2023-08-26 DIAGNOSIS — K5900 Constipation, unspecified: Secondary | ICD-10-CM | POA: Diagnosis not present

## 2023-08-26 DIAGNOSIS — Z5111 Encounter for antineoplastic chemotherapy: Secondary | ICD-10-CM | POA: Diagnosis not present

## 2023-08-26 DIAGNOSIS — Z95828 Presence of other vascular implants and grafts: Secondary | ICD-10-CM

## 2023-08-26 MED ORDER — SODIUM CHLORIDE 0.9 % IV SOLN
75.0000 mg/m2 | Freq: Once | INTRAVENOUS | Status: AC
  Administered 2023-08-26: 126 mg via INTRAVENOUS
  Filled 2023-08-26: qty 12.6

## 2023-08-26 MED ORDER — SODIUM CHLORIDE 0.9% FLUSH
10.0000 mL | Freq: Once | INTRAVENOUS | Status: AC
  Administered 2023-08-26: 10 mL via INTRAVENOUS

## 2023-08-26 MED ORDER — PALONOSETRON HCL INJECTION 0.25 MG/5ML
0.2500 mg | Freq: Once | INTRAVENOUS | Status: AC
  Administered 2023-08-26: 0.25 mg via INTRAVENOUS
  Filled 2023-08-26: qty 5

## 2023-08-26 MED ORDER — HEPARIN SOD (PORK) LOCK FLUSH 100 UNIT/ML IV SOLN
500.0000 [IU] | Freq: Once | INTRAVENOUS | Status: AC
  Administered 2023-08-26: 500 [IU] via INTRAVENOUS

## 2023-08-26 MED ORDER — SODIUM CHLORIDE 0.9 % IV SOLN: Freq: Once | INTRAVENOUS | Status: AC

## 2023-08-26 NOTE — Patient Instructions (Signed)
 [facility] [place] [facility]  Discharge Instructions: Thank you for choosing [facility] to provide your oncology and hematology care.  If you have a lab appointment with the Cancer Center - please note that after April 8th, 2024, all labs will be drawn in the cancer center.  You do not have to check in or register with the main entrance as you have in the past but will complete your check-in in the cancer center.  Wear comfortable clothing and clothing appropriate for easy access to any Portacath or PICC line.   We strive to give you quality time with your provider. You may need to reschedule your appointment if you arrive late (15 or more minutes).  Arriving late affects you and other patients whose appointments are after yours.  Also, if you miss three or more appointments without notifying the office, you may be dismissed from the clinic at the provider's discretion.      For prescription refill requests, have your pharmacy contact our office and allow 72 hours for refills to be completed.    Today you received the following chemotherapy and/or immunotherapy agents vidaza.       To help prevent nausea and vomiting after your treatment, we encourage you to take your nausea medication as directed.  BELOW ARE SYMPTOMS THAT SHOULD BE REPORTED IMMEDIATELY: *FEVER GREATER THAN 100.4 F (38 C) OR HIGHER *CHILLS OR SWEATING *NAUSEA AND VOMITING THAT IS NOT CONTROLLED WITH YOUR NAUSEA MEDICATION *UNUSUAL SHORTNESS OF BREATH *UNUSUAL BRUISING OR BLEEDING *URINARY PROBLEMS (pain or burning when urinating, or frequent urination) *BOWEL PROBLEMS (unusual diarrhea, constipation, pain near the anus) TENDERNESS IN MOUTH AND THROAT WITH OR WITHOUT PRESENCE OF ULCERS (sore throat, sores in mouth, or a toothache) UNUSUAL RASH, SWELLING OR PAIN  UNUSUAL VAGINAL DISCHARGE OR ITCHING   Items with * indicate a potential emergency and should be followed up  as soon as possible or go to the Emergency Department if any problems should occur.  Please show the CHEMOTHERAPY ALERT CARD or IMMUNOTHERAPY ALERT CARD at check-in to the Emergency Department and triage nurse.  Should you have questions after your visit or need to cancel or reschedule your appointment, please contact [facility] CANCER CTR [place] - A DEPT OF [facility] Bridegroom [facility] [phone]  and follow the prompts.  Office hours are 8:00 a.m. to 4:30 p.m. Monday - Friday. Please note that voicemails left after 4:00 p.m. may not be returned until the following business day.  We are closed weekends and major holidays. You have access to a nurse at all times for urgent questions. Please call the main number to the clinic [phone] and follow the prompts.  For any non-urgent questions, you may also contact your provider using MyChart. We now offer e-Visits for anyone 8 and older to request care online for non-urgent symptoms. For details visit [email or web address].   Also download the MyChart app! Go to the app store, search "MyChart", open the app, select [place], and log in with your MyChart username and password.

## 2023-08-26 NOTE — Progress Notes (Signed)
 Patient presents today for Vidaza  infusion per providers order.  Vital signs WNL.  Patient has no new complaints at this time. Treatment given today per MD orders.  Stabled during infusion without adverse affects.  Vital signs stable.  No complaints at this time.  Discharge from clinic ambulatory in stable condition.  Alert and oriented X 3.  Follow up with [facility] as scheduled.

## 2023-08-29 ENCOUNTER — Inpatient Hospital Stay: Payer: Medicare HMO

## 2023-08-29 VITALS — BP 94/60 | HR 72 | Temp 97.8°F | Resp 18

## 2023-08-29 DIAGNOSIS — C9310 Chronic myelomonocytic leukemia not having achieved remission: Secondary | ICD-10-CM

## 2023-08-29 DIAGNOSIS — D539 Nutritional anemia, unspecified: Secondary | ICD-10-CM | POA: Diagnosis not present

## 2023-08-29 DIAGNOSIS — Z5111 Encounter for antineoplastic chemotherapy: Secondary | ICD-10-CM | POA: Diagnosis not present

## 2023-08-29 DIAGNOSIS — Z95828 Presence of other vascular implants and grafts: Secondary | ICD-10-CM

## 2023-08-29 DIAGNOSIS — D696 Thrombocytopenia, unspecified: Secondary | ICD-10-CM | POA: Diagnosis not present

## 2023-08-29 DIAGNOSIS — E538 Deficiency of other specified B group vitamins: Secondary | ICD-10-CM | POA: Diagnosis not present

## 2023-08-29 DIAGNOSIS — K5900 Constipation, unspecified: Secondary | ICD-10-CM | POA: Diagnosis not present

## 2023-08-29 DIAGNOSIS — R161 Splenomegaly, not elsewhere classified: Secondary | ICD-10-CM | POA: Diagnosis not present

## 2023-08-29 DIAGNOSIS — F32A Depression, unspecified: Secondary | ICD-10-CM | POA: Diagnosis not present

## 2023-08-29 DIAGNOSIS — N946 Dysmenorrhea, unspecified: Secondary | ICD-10-CM | POA: Diagnosis not present

## 2023-08-29 LAB — CBC WITH DIFFERENTIAL/PLATELET
Abs Immature Granulocytes: 0.5 10*3/uL — ABNORMAL HIGH (ref 0.00–0.07)
Band Neutrophils: 4 %
Basophils Absolute: 0 10*3/uL (ref 0.0–0.1)
Basophils Relative: 0 %
Eosinophils Absolute: 0 10*3/uL (ref 0.0–0.5)
Eosinophils Relative: 0 %
HCT: 29.7 % — ABNORMAL LOW (ref 36.0–46.0)
Hemoglobin: 9.1 g/dL — ABNORMAL LOW (ref 12.0–15.0)
Lymphocytes Relative: 8 %
Lymphs Abs: 0.7 10*3/uL (ref 0.7–4.0)
MCH: 30.1 pg (ref 26.0–34.0)
MCHC: 30.6 g/dL (ref 30.0–36.0)
MCV: 98.3 fL (ref 80.0–100.0)
Metamyelocytes Relative: 3 %
Monocytes Absolute: 0.1 10*3/uL (ref 0.1–1.0)
Monocytes Relative: 1 %
Myelocytes: 3 %
Neutro Abs: 7.5 10*3/uL (ref 1.7–7.7)
Neutrophils Relative %: 81 %
Platelets: 151 10*3/uL (ref 150–400)
RBC: 3.02 MIL/uL — ABNORMAL LOW (ref 3.87–5.11)
RDW: 19.4 % — ABNORMAL HIGH (ref 11.5–15.5)
WBC: 8.8 10*3/uL (ref 4.0–10.5)
nRBC: 1.7 % — ABNORMAL HIGH (ref 0.0–0.2)
nRBC: 2 /100{WBCs} — ABNORMAL HIGH

## 2023-08-29 LAB — COMPREHENSIVE METABOLIC PANEL
ALT: 17 U/L (ref 0–44)
AST: 26 U/L (ref 15–41)
Albumin: 4.1 g/dL (ref 3.5–5.0)
Alkaline Phosphatase: 53 U/L (ref 38–126)
Anion gap: 9 (ref 5–15)
BUN: 18 mg/dL (ref 8–23)
CO2: 28 mmol/L (ref 22–32)
Calcium: 8.8 mg/dL — ABNORMAL LOW (ref 8.9–10.3)
Chloride: 100 mmol/L (ref 98–111)
Creatinine, Ser: 0.91 mg/dL (ref 0.44–1.00)
GFR, Estimated: >60 mL/min (ref >60)
Glucose, Bld: 182 mg/dL — ABNORMAL HIGH (ref 70–99)
Potassium: 3.9 mmol/L (ref 3.5–5.1)
Sodium: 137 mmol/L (ref 135–145)
Total Bilirubin: 1.2 mg/dL (ref 0.0–1.2)
Total Protein: 6.6 g/dL (ref 6.5–8.1)

## 2023-08-29 LAB — MAGNESIUM: Magnesium: 2.3 mg/dL (ref 1.7–2.4)

## 2023-08-29 MED ORDER — SODIUM CHLORIDE 0.9% FLUSH
10.0000 mL | INTRAVENOUS | Status: DC | PRN
  Administered 2023-08-29: 10 mL via INTRAVENOUS

## 2023-08-29 MED ORDER — HEPARIN SOD (PORK) LOCK FLUSH 100 UNIT/ML IV SOLN
500.0000 [IU] | Freq: Once | INTRAVENOUS | Status: AC
  Administered 2023-08-29: 500 [IU] via INTRAVENOUS

## 2023-08-29 MED ORDER — SODIUM CHLORIDE 0.9 % IV SOLN: Freq: Once | INTRAVENOUS | Status: AC

## 2023-08-29 MED ORDER — SODIUM CHLORIDE 0.9 % IV SOLN
75.0000 mg/m2 | Freq: Once | INTRAVENOUS | Status: AC
  Administered 2023-08-29: 126 mg via INTRAVENOUS
  Filled 2023-08-29: qty 12.6

## 2023-08-29 MED ORDER — PALONOSETRON HCL INJECTION 0.25 MG/5ML
0.2500 mg | Freq: Once | INTRAVENOUS | Status: AC
  Administered 2023-08-29: 0.25 mg via INTRAVENOUS
  Filled 2023-08-29: qty 5

## 2023-08-29 NOTE — Progress Notes (Signed)
 Patient presents today for D6 Vidaza  infusion.  Patient is in satisfactory condition with no new complaints voiced.  Vital signs are stable.  Labs reviewed and all labs are within treatment parameters.  We will proceed with treatment per MD orders.    Treatment given today per MD orders. Tolerated infusion without adverse affects. Vital signs stable. No complaints at this time. Discharged from clinic ambulatory in stable condition. Alert and oriented x 3. F/U with [facility] as scheduled.

## 2023-08-29 NOTE — Patient Instructions (Signed)
 [facility] [place] [facility]  Discharge Instructions: Thank you for choosing [facility] to provide your oncology and hematology care.  If you have a lab appointment with the Cancer Center - please note that after April 8th, 2024, all labs will be drawn in the cancer center.  You do not have to check in or register with the main entrance as you have in the past but will complete your check-in in the cancer center.  Wear comfortable clothing and clothing appropriate for easy access to any Portacath or PICC line.   We strive to give you quality time with your provider. You may need to reschedule your appointment if you arrive late (15 or more minutes).  Arriving late affects you and other patients whose appointments are after yours.  Also, if you miss three or more appointments without notifying the office, you may be dismissed from the clinic at the provider's discretion.      For prescription refill requests, have your pharmacy contact our office and allow 72 hours for refills to be completed.    Today you received the following chemotherapy and/or immunotherapy agents D6 Vidaza   To help prevent nausea and vomiting after your treatment, we encourage you to take your nausea medication as directed.  Azacitidine Injection What is this medication? AZACITIDINE (ay za SITE i deen) treats blood and bone marrow cancers. It works by slowing down the growth of cancer cells. This medicine may be used for other purposes; ask your health care provider or pharmacist if you have questions. COMMON BRAND NAME(S): Vidaza What should I tell my care team before I take this medication? They need to know if you have any of these conditions: Kidney disease Liver disease Low blood cell levels, such as low white cells, platelets, or red blood cells Low levels of albumin in the blood Low levels of bicarbonate in the blood An unusual or allergic reaction to  azacitidine, mannitol, other medications, foods, dyes, or preservatives If you or your partner are pregnant or trying to get pregnant Breast-feeding How should I use this medication? This medication is injected into a vein or under the skin. It is given by your care team in a hospital or clinic setting. Talk to your care team about the use of this medication in children. While it may be prescribed for children as young as 1 month for selected conditions, precautions do apply. Overdosage: If you think you have taken too much of this medicine contact a poison control center or emergency room at once. NOTE: This medicine is only for you. Do not share this medicine with others. What if I miss a dose? Keep appointments for follow-up doses. It is important not to miss your dose. Call your care team if you are unable to keep an appointment. What may interact with this medication? Interactions are not expected. This list may not describe all possible interactions. Give your health care provider a list of all the medicines, herbs, non-prescription drugs, or dietary supplements you use. Also tell them if you smoke, drink alcohol, or use illegal drugs. Some items may interact with your medicine. What should I watch for while using this medication? Your condition will be monitored carefully while you are receiving this medication. This medication may make you feel generally unwell. This is not uncommon as chemotherapy can affect healthy cells as well as cancer cells. Report any side effects. Continue your course of treatment  even though you feel ill unless your care team tells you to stop. You may need blood work done while you are taking this medication. Other product types may be available that contain the medication azacitidine. The injection and oral products should not be used in place of one another. Talk to your care team if you have questions. This medication can cause serious side effects. To reduce  the risk, your care team may give you other medications to take before receiving this one. Be sure to follow the directions from your care team. This medication may increase your risk of getting an infection. Call your care team for advice if you get a fever, chills, sore throat, or other symptoms of a cold or flu. Do not treat yourself. Try to avoid being around people who are sick. Avoid taking medications that contain aspirin, acetaminophen, ibuprofen, naproxen, or ketoprofen unless instructed by your care team. These medications may hide a fever. Be careful brushing or flossing your teeth or using a toothpick because you may get an infection or bleed more easily. If you have any dental work done, tell your dentist you are receiving this medication. Talk to your care team if you or your partner may be pregnant. Serious birth defects can occur if you take this medication during pregnancy and for 6 months after the last dose. You will need a negative pregnancy test before starting this medication. Contraception is recommended while taking this medication and for 6 months after the last dose. Your care team can help you find the option that works for you. If your partner can get pregnant, use a condom during sex while taking this medication and for 3 months after the last dose. Do not breastfeed while taking this medication and for 1 week after the last dose. This medication may cause infertility. Talk to your care team if you are concerned about your fertility. What side effects may I notice from receiving this medication? Side effects that you should report to your care team as soon as possible: Allergic reactions--skin rash, itching, hives, swelling of the face, lips, tongue, or throat Infection--fever, chills, cough, sore throat, wounds that don't heal, pain or trouble when passing urine, general feeling of discomfort or being unwell Kidney injury--decrease in the amount of urine, swelling of the  ankles, hands, or feet Liver injury--right upper belly pain, loss of appetite, nausea, light-colored stool, dark yellow or brown urine, yellowing skin or eyes, unusual weakness or fatigue Low red blood cell level--unusual weakness or fatigue, dizziness, headache, trouble breathing Tumor lysis syndrome (TLS)--nausea, vomiting, diarrhea, decrease in the amount of urine, dark urine, unusual weakness or fatigue, confusion, muscle pain or cramps, fast or irregular heartbeat, joint pain Unusual bruising or bleeding Side effects that usually do not require medical attention (report to your care team if they continue or are bothersome): Constipation Diarrhea Nausea Pain, redness, or irritation at injection site Vomiting This list may not describe all possible side effects. Call your doctor for medical advice about side effects. You may report side effects to FDA at 1-800-FDA-1088. Where should I keep my medication? This medication is given in a hospital or clinic. It will not be stored at home. NOTE: This sheet is a summary. It may not cover all possible information. If you have questions about this medicine, talk to your doctor, pharmacist, or health care provider.  2024 Elsevier/Gold Standard (2023-03-07 00:00:00)  BELOW ARE SYMPTOMS THAT SHOULD BE REPORTED IMMEDIATELY: *FEVER GREATER THAN 100.4 F (38 C)  OR HIGHER *CHILLS OR SWEATING *NAUSEA AND VOMITING THAT IS NOT CONTROLLED WITH YOUR NAUSEA MEDICATION *UNUSUAL SHORTNESS OF BREATH *UNUSUAL BRUISING OR BLEEDING *URINARY PROBLEMS (pain or burning when urinating, or frequent urination) *BOWEL PROBLEMS (unusual diarrhea, constipation, pain near the anus) TENDERNESS IN MOUTH AND THROAT WITH OR WITHOUT PRESENCE OF ULCERS (sore throat, sores in mouth, or a toothache) UNUSUAL RASH, SWELLING OR PAIN  UNUSUAL VAGINAL DISCHARGE OR ITCHING   Items with * indicate a potential emergency and should be followed up as soon as possible or go to the  Emergency Department if any problems should occur.  Please show the CHEMOTHERAPY ALERT CARD or IMMUNOTHERAPY ALERT CARD at check-in to the Emergency Department and triage nurse.  Should you have questions after your visit or need to cancel or reschedule your appointment, please contact [facility] CANCER CTR [place] - A DEPT OF Eligha Bridegroom [facility] [phone]  and follow the prompts.  Office hours are 8:00 a.m. to 4:30 p.m. Monday - Friday. Please note that voicemails left after 4:00 p.m. may not be returned until the following business day.  We are closed weekends and major holidays. You have access to a nurse at all times for urgent questions. Please call the main number to the clinic [phone] and follow the prompts.  For any non-urgent questions, you may also contact your provider using MyChart. We now offer e-Visits for anyone 1 and older to request care online for non-urgent symptoms. For details visit [email or web address].   Also download the MyChart app! Go to the app store, search "MyChart", open the app, select [place], and log in with your MyChart username and password.

## 2023-08-30 ENCOUNTER — Inpatient Hospital Stay: Payer: Medicare HMO

## 2023-08-30 VITALS — BP 96/58 | HR 64 | Temp 97.7°F | Resp 18

## 2023-08-30 DIAGNOSIS — D539 Nutritional anemia, unspecified: Secondary | ICD-10-CM | POA: Diagnosis not present

## 2023-08-30 DIAGNOSIS — Z5111 Encounter for antineoplastic chemotherapy: Secondary | ICD-10-CM | POA: Diagnosis not present

## 2023-08-30 DIAGNOSIS — Z95828 Presence of other vascular implants and grafts: Secondary | ICD-10-CM

## 2023-08-30 DIAGNOSIS — E538 Deficiency of other specified B group vitamins: Secondary | ICD-10-CM | POA: Diagnosis not present

## 2023-08-30 DIAGNOSIS — C9310 Chronic myelomonocytic leukemia not having achieved remission: Secondary | ICD-10-CM

## 2023-08-30 DIAGNOSIS — N946 Dysmenorrhea, unspecified: Secondary | ICD-10-CM | POA: Diagnosis not present

## 2023-08-30 DIAGNOSIS — K5900 Constipation, unspecified: Secondary | ICD-10-CM | POA: Diagnosis not present

## 2023-08-30 DIAGNOSIS — R161 Splenomegaly, not elsewhere classified: Secondary | ICD-10-CM | POA: Diagnosis not present

## 2023-08-30 DIAGNOSIS — F32A Depression, unspecified: Secondary | ICD-10-CM | POA: Diagnosis not present

## 2023-08-30 DIAGNOSIS — D696 Thrombocytopenia, unspecified: Secondary | ICD-10-CM | POA: Diagnosis not present

## 2023-08-30 MED ORDER — SODIUM CHLORIDE 0.9 % IV SOLN
75.0000 mg/m2 | Freq: Once | INTRAVENOUS | Status: AC
  Administered 2023-08-30: 126 mg via INTRAVENOUS
  Filled 2023-08-30: qty 12.6

## 2023-08-30 MED ORDER — SODIUM CHLORIDE 0.9 % IV SOLN: Freq: Once | INTRAVENOUS | Status: AC

## 2023-08-30 MED ORDER — HEPARIN SOD (PORK) LOCK FLUSH 100 UNIT/ML IV SOLN
500.0000 [IU] | Freq: Once | INTRAVENOUS | Status: AC
  Administered 2023-08-30: 500 [IU] via INTRAVENOUS

## 2023-08-30 MED ORDER — SODIUM CHLORIDE 0.9% FLUSH
10.0000 mL | Freq: Once | INTRAVENOUS | Status: AC
  Administered 2023-08-30: 10 mL via INTRAVENOUS

## 2023-08-30 NOTE — Progress Notes (Signed)
Patient presents today for D7 Vidaza per provider's order. Vital signs stable and patient voiced no new complaints at this time.  Treatment given today per MD orders. Tolerated infusion without adverse affects. Vital signs stable. No complaints at this time. Discharged from clinic ambulatory in stable condition. Alert and oriented x 3. F/U with [facility] as scheduled.

## 2023-08-30 NOTE — Patient Instructions (Signed)
[facility] [place] - A DEPT OF [facility]  Discharge Instructions: Thank you for choosing [facility] to provide your oncology and hematology care.  If you have a lab appointment with the Cancer Center - please note that after April 8th, 2024, all labs will be drawn in the cancer center.  You do not have to check in or register with the main entrance as you have in the past but will compleAzacitidine Injection What is this medication? AZACITIDINE (ay za SITE i deen) treats blood and bone marrow cancers. It works by slowing down the growth of cancer cells. This medicine may be used for other purposes; ask your health care provider or pharmacist if you have questions. COMMON BRAND NAME(S): Vidaza What should I tell my care team before I take this medication? They need to know if you have any of these conditions: Kidney disease Liver disease Low blood cell levels, such as low white cells, platelets, or red blood cells Low levels of albumin in the blood Low levels of bicarbonate in the blood An unusual or allergic reaction to azacitidine, mannitol, other medications, foods, dyes, or preservatives If you or your partner are pregnant or trying to get pregnant Breast-feeding How should I use this medication? This medication is injected into a vein or under the skin. It is given by your care team in a hospital or clinic setting. Talk to your care team about the use of this medication in children. While it may be prescribed for children as young as 1 month for selected conditions, precautions do apply. Overdosage: If you think you have taken too much of this medicine contact a poison control center or emergency room at once. NOTE: This medicine is only for you. Do not share this medicine with others. What if I miss a dose? Keep appointments for follow-up doses. It is important not to miss your dose. Call your care team if you are unable to keep an  appointment. What may interact with this medication? Interactions are not expected. This list may not describe all possible interactions. Give your health care provider a list of all the medicines, herbs, non-prescription drugs, or dietary supplements you use. Also tell them if you smoke, drink alcohol, or use illegal drugs. Some items may interact with your medicine. What should I watch for while using this medication? Your condition will be monitored carefully while you are receiving this medication. This medication may make you feel generally unwell. This is not uncommon as chemotherapy can affect healthy cells as well as cancer cells. Report any side effects. Continue your course of treatment even though you feel ill unless your care team tells you to stop. You may need blood work done while you are taking this medication. Other product types may be available that contain the medication azacitidine. The injection and oral products should not be used in place of one another. Talk to your care team if you have questions. This medication can cause serious side effects. To reduce the risk, your care team may give you other medications to take before receiving this one. Be sure to follow the directions from your care team. This medication may increase your risk of getting an infection. Call your care team for advice if you get a fever, chills, sore throat, or other symptoms of a cold or flu. Do not treat yourself. Try to avoid being around people who are sick. Avoid taking medications that contain aspirin, acetaminophen, ibuprofen,  naproxen, or ketoprofen unless instructed by your care team. These medications may hide a fever. Be careful brushing or flossing your teeth or using a toothpick because you may get an infection or bleed more easily. If you have any dental work done, tell your dentist you are receiving this medication. Talk to your care team if you or your partner may be pregnant. Serious birth  defects can occur if you take this medication during pregnancy and for 6 months after the last dose. You will need a negative pregnancy test before starting this medication. Contraception is recommended while taking this medication and for 6 months after the last dose. Your care team can help you find the option that works for you. If your partner can get pregnant, use a condom during sex while taking this medication and for 3 months after the last dose. Do not breastfeed while taking this medication and for 1 week after the last dose. This medication may cause infertility. Talk to your care team if you are concerned about your fertility. What side effects may I notice from receiving this medication? Side effects that you should report to your care team as soon as possible: Allergic reactions--skin rash, itching, hives, swelling of the face, lips, tongue, or throat Infection--fever, chills, cough, sore throat, wounds that don't heal, pain or trouble when passing urine, general feeling of discomfort or being unwell Kidney injury--decrease in the amount of urine, swelling of the ankles, hands, or feet Liver injury--right upper belly pain, loss of appetite, nausea, light-colored stool, dark yellow or brown urine, yellowing skin or eyes, unusual weakness or fatigue Low red blood cell level--unusual weakness or fatigue, dizziness, headache, trouble breathing Tumor lysis syndrome (TLS)--nausea, vomiting, diarrhea, decrease in the amount of urine, dark urine, unusual weakness or fatigue, confusion, muscle pain or cramps, fast or irregular heartbeat, joint pain Unusual bruising or bleeding Side effects that usually do not require medical attention (report to your care team if they continue or are bothersome): Constipation Diarrhea Nausea Pain, redness, or irritation at injection site Vomiting This list may not describe all possible side effects. Call your doctor for medical advice about side effects. You  may report side effects to FDA at 1-800-FDA-1088. Where should I keep my medication? This medication is given in a hospital or clinic. It will not be stored at home. NOTE: This sheet is a summary. It may not cover all possible information. If you have questions about this medicine, talk to your doctor, pharmacist, or health care provider.  2024 Elsevier/Gold Standard (2023-03-07 00:00:00)te your check-in in the cancer center.  Wear comfortable clothing and clothing appropriate for easy access to any Portacath or PICC line.   We strive to give you quality time with your provider. You may need to reschedule your appointment if you arrive late (15 or more minutes).  Arriving late affects you and other patients whose appointments are after yours.  Also, if you miss three or more appointments without notifying the office, you may be dismissed from the clinic at the provider's discretion.      For prescription refill requests, have your pharmacy contact our office and allow 72 hours for refills to be completed.    Today you received the following chemotherapy and/or immunotherapy agents D7 Vidaza   To help prevent nausea and vomiting after your treatment, we encourage you to take your nausea medication as directed.   BELOW ARE SYMPTOMS THAT SHOULD BE REPORTED IMMEDIATELY: *FEVER GREATER THAN 100.4 F (38 C) OR  HIGHER *CHILLS OR SWEATING *NAUSEA AND VOMITING THAT IS NOT CONTROLLED WITH YOUR NAUSEA MEDICATION *UNUSUAL SHORTNESS OF BREATH *UNUSUAL BRUISING OR BLEEDING *URINARY PROBLEMS (pain or burning when urinating, or frequent urination) *BOWEL PROBLEMS (unusual diarrhea, constipation, pain near the anus) TENDERNESS IN MOUTH AND THROAT WITH OR WITHOUT PRESENCE OF ULCERS (sore throat, sores in mouth, or a toothache) UNUSUAL RASH, SWELLING OR PAIN  UNUSUAL VAGINAL DISCHARGE OR ITCHING   Items with * indicate a potential emergency and should be followed up as soon as possible or go to the  Emergency Department if any problems should occur.  Please show the CHEMOTHERAPY ALERT CARD or IMMUNOTHERAPY ALERT CARD at check-in to the Emergency Department and triage nurse.  Should you have questions after your visit or need to cancel or reschedule your appointment, please contact [facility] [place] - A DEPT OF [facility] [phone]  and follow the prompts.  Office hours are 8:00 a.m. to 4:30 p.m. Monday - Friday. Please note that voicemails left after 4:00 p.m. may not be returned until the following business day.  We are closed weekends and major holidays. You have access to a nurse at all times for urgent questions. Please call the main number to the clinic [phone] and follow the prompts.  For any non-urgent questions, you may also contact your provider using MyChart. We now offer e-Visits for anyone 70 and older to request care online for non-urgent symptoms. For details visit [email or web address].   Also download the MyChart app! Go to the app store, search "MyChart", open the app, select [place], and log in with your MyChart username and password.

## 2023-09-05 ENCOUNTER — Ambulatory Visit: Payer: Medicare HMO

## 2023-09-08 ENCOUNTER — Other Ambulatory Visit: Payer: Self-pay | Admitting: *Deleted

## 2023-09-08 MED ORDER — LACTULOSE 10 GM/15ML PO SOLN: 10.0000 g | ORAL | 2 refills | Status: AC | PRN

## 2023-09-12 ENCOUNTER — Other Ambulatory Visit (HOSPITAL_COMMUNITY): Payer: Self-pay | Admitting: Internal Medicine

## 2023-09-12 DIAGNOSIS — Z1231 Encounter for screening mammogram for malignant neoplasm of breast: Secondary | ICD-10-CM

## 2023-09-13 ENCOUNTER — Telehealth: Payer: Self-pay

## 2023-09-13 ENCOUNTER — Other Ambulatory Visit: Payer: Self-pay | Admitting: *Deleted

## 2023-09-13 ENCOUNTER — Other Ambulatory Visit (HOSPITAL_COMMUNITY): Payer: Self-pay

## 2023-09-13 DIAGNOSIS — K5900 Constipation, unspecified: Secondary | ICD-10-CM | POA: Diagnosis not present

## 2023-09-13 DIAGNOSIS — C9310 Chronic myelomonocytic leukemia not having achieved remission: Secondary | ICD-10-CM | POA: Diagnosis not present

## 2023-09-13 DIAGNOSIS — D7581 Myelofibrosis: Secondary | ICD-10-CM | POA: Diagnosis not present

## 2023-09-13 DIAGNOSIS — J45909 Unspecified asthma, uncomplicated: Secondary | ICD-10-CM | POA: Diagnosis not present

## 2023-09-13 DIAGNOSIS — Z9221 Personal history of antineoplastic chemotherapy: Secondary | ICD-10-CM | POA: Diagnosis not present

## 2023-09-13 DIAGNOSIS — F419 Anxiety disorder, unspecified: Secondary | ICD-10-CM | POA: Diagnosis not present

## 2023-09-13 DIAGNOSIS — E790 Hyperuricemia without signs of inflammatory arthritis and tophaceous disease: Secondary | ICD-10-CM | POA: Diagnosis not present

## 2023-09-13 DIAGNOSIS — C9200 Acute myeloblastic leukemia, not having achieved remission: Secondary | ICD-10-CM | POA: Diagnosis not present

## 2023-09-13 DIAGNOSIS — E538 Deficiency of other specified B group vitamins: Secondary | ICD-10-CM | POA: Diagnosis not present

## 2023-09-13 DIAGNOSIS — F429 Obsessive-compulsive disorder, unspecified: Secondary | ICD-10-CM | POA: Diagnosis not present

## 2023-09-13 MED ORDER — TEZSPIRE 210 MG/1.91ML ~~LOC~~ SOAJ
210.0000 mg | SUBCUTANEOUS | 11 refills | Status: AC
  Filled 2023-09-26: qty 1.91, 28d supply, fill #0
  Filled 2023-10-17: qty 1.91, 28d supply, fill #1
  Filled 2023-11-18: qty 1.91, 28d supply, fill #2
  Filled 2024-01-23: qty 1.91, 28d supply, fill #3
  Filled 2024-02-10: qty 1.91, 28d supply, fill #4
  Filled 2024-03-15: qty 1.91, 28d supply, fill #5
  Filled 2024-04-06: qty 1.91, 28d supply, fill #6
  Filled 2024-05-04 – 2024-05-14 (×4): qty 1.91, 28d supply, fill #7
  Filled 2024-06-07: qty 1.91, 28d supply, fill #8

## 2023-09-13 NOTE — Telephone Encounter (Signed)
 Test claim for [name] to initiate possible PAP

## 2023-09-13 NOTE — Telephone Encounter (Signed)
 Tammy received approval. New test claim sent to [facility]

## 2023-09-18 NOTE — Progress Notes (Signed)
 [facility] [address] [place] [address]    Clinic Day:  09/19/2023  Referring physician: Carylon Perches, MD  Patient Care Team: Carylon Perches, MD as PCP - General (Internal Medicine) Jena Gauss Gerrit Friends, MD as Attending Physician (Gastroenterology) Storm Frisk, MD as Consulting Physician (Pulmonary Disease) Doreatha Massed, MD as Medical Oncologist (Hematology)   ASSESSMENT & PLAN:   Assessment: 1.  Macrocytic anemia and thrombocytopenia: - Patient seen at the request of Dr. Ouida Sills for abnormal CBC. - 04/17/2022: WBC 15.8, Hb-9.7, PLT-128 - 03/24/2022: WBC-8.1 (N-43%, L-17%, M-19%, B-2%), Hb-9.6, MCV-99, PLT-115 - Smear review: 13% metamyelocytes, 2% myelocytes, 2% blasts,  - 10/19/2021: WBC-5.3 (N-61, L-21, M-10%), Hb-11, MCV-96, PLT-97 - BMBX (05/25/2022): Hypercellular bone marrow with myeloid hyperplasia with dysgranulopoiesis, erythroid hypoplasia and megakaryocytic hyperplasia with dyspoiesis.  Chromosome analysis was 31, XX.  Bone marrow blasts less than 5%.  Peripheral blood blasts less than 2%. - Mayo molecular model risk stratification: Intermediate 2 risk with at least 2 points (decreased hemoglobin less than 10, circulating immature cells).  Intermediate 2 risk with median OS 31 months. - Serum EPO level 41. - NGS: Positive for CBL, MPL, SRSF2, TET 2, RAD21 - PET scan (07/08/2022): Splenomegaly (volume 510 mm) with normal metabolic activity.  Uniform increase in marrow metabolic activity related to patient's anemia.  No lymphadenopathy. - BMBX (07/16/2022): Hypercellular bone marrow (95-100%) with myeloid hyperplasia, atypical monocytes, increased blasts (16% overall).  Atypical monocytosis present 23% of total events, expressing HLA-DR, CD38, CD4 dim, CD11C, CD13, CD14, CD36, CD64 with aberrant coexpression of CD56 and CD7. - Cycle 1 of azacitidine on 08/02/2022, cycle 2 on 08/30/2022.  Azacitidine decreased to 5 days starting cycle 5 on 11/22/2022 - BMBX  (09/21/2022): 3% blasts, hypercellular marrow with markedly increased dyspoietic megakaryocytes, increased fibrosis - BMBX (11/16/2022): Hypercellular marrow (70-80%) with marked GIST [name] (, myelofibrosis and 3% blasts on a very limited sample. - BMBX (05/06/2023): Hypercellular marrow with 85% cellularity with fibrosis and 4% blasts approximately. - BMBX (07/08/2023): Hypercellular marrow (95%) with increased myelopoiesis, dysmenorrhea Leary Roca is, increased fibrosis and 5% blasts.  Cytogenetics 3, XX. - NGS (07/08/2023): MPL, SRSF2, RUNX1, TET2 - BMBX (09/13/2023): Hypercellular marrow (100%) with increased myelopoiesis and this megakaryocytes.  Moderate myelofibrosis, grade 2 of 3.  No increase in blasts (1%).   2.  Social/family history: - She lives at home by herself.  Son lives next door. - Does part-time work at [employer].  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  Non-smoker. - Maternal aunt had tumor in the breast, patient not certain if it is cancer.  2 maternal first cousins had lymphoma.  Mother had stomach cancer.    Plan: 1.  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 - She underwent repeat bone marrow biopsy on 09/12/2023.  We have reviewed results which showed no increase in blasts. - Reviewed labs today: Total bilirubin 1.3 and rest of the LFTs are normal.  CBC with white count 2.4 and ANC of 1.4.  Platelet count is 103.  Hemoglobin 8.5. - She may proceed with cycle 14 at azacitidine 75 mg/m x 7 days every 28 days.  Bone marrow will be repeated after cycle 15.   2.  Folate deficiency: - Continue folic acid tablet daily.   3.  Weight loss: - Continue boost +1 to 2 cans/day.  Weight is  stable.   4.  Constipation: - Colace and lactulose did not help.  She used to sorbitol which helped.  She is planning to start MiraLAX daily.    Orders Placed  This Encounter  Procedures   Comprehensive metabolic panel    Standing Status:   Future    Expected Date:   10/17/2023    Expiration Date:   10/16/2024   Magnesium    Standing Status:   Future    Expected Date:   10/17/2023    Expiration Date:   10/16/2024   Lactate dehydrogenase    Standing Status:   Future    Expected Date:   10/17/2023    Expiration Date:   10/16/2024   CMP (Cancer Center only)    Standing Status:   Future    Expected Date:   10/17/2023    Expiration Date:   10/16/2024   CBC with Differential    Standing Status:   Future    Expected Date:   10/17/2023    Expiration Date:   10/16/2024   Comprehensive metabolic panel    Standing Status:   Future    Expected Date:   11/14/2023    Expiration Date:   11/13/2024   Magnesium    Standing Status:   Future    Expected Date:   11/14/2023    Expiration Date:   11/13/2024   Lactate dehydrogenase    Standing Status:   Future    Expected Date:   11/14/2023    Expiration Date:   11/13/2024   CMP (Cancer Center only)    Standing Status:   Future    Expected Date:   11/14/2023    Expiration Date:   11/13/2024   CBC with Differential    Standing Status:   Future    Expected Date:   11/14/2023    Expiration Date:   11/13/2024      I,Katie Daubenspeck,acting as a scribe for Doreatha Massed, MD.,have documented all relevant documentation on the behalf of Doreatha Massed, MD,as directed by  Doreatha Massed, MD while in the presence of Doreatha Massed, MD.   I, Doreatha Massed MD, have reviewed the above documentation for accuracy and completeness, and I agree with the above.   Doreatha Massed, MD   3/3/202511:41 AM  CHIEF COMPLAINT:   Diagnosis: CMML-2    Cancer Staging  No matching staging information was found for the patient.    Prior Therapy: none  Current Therapy:  Azacitidine 70 mg/m x 7 days every 28 days    HISTORY OF PRESENT ILLNESS:   Oncology History  CMML (chronic myelomonocytic  leukemia) (HCC)  06/03/2022 Initial Diagnosis   CMML (chronic myelomonocytic leukemia) (HCC)   08/02/2022 -  Chemotherapy   Patient is on Treatment Plan : MYELODYSPLASIA  Azacitidine IVPB D1-7 q28d        INTERVAL HISTORY:   [name] is a 73 y.o. female presenting to clinic today for follow up of CMML-2. She was last seen by me on 08/22/23.  Since her last visit, she was seen by Dr. Lowell Guitar at [facility] on 09/13/23 and underwent repeat bone marrow biopsy the same day.  Today, she states that she is doing well overall. Her appetite level is at 70%. Her energy level is at 90%.  PAST MEDICAL HISTORY:   Past Medical History: Past Medical History:  Diagnosis Date   Anxiety    Asthma    had 1 episode 1 year ago-no more problem   CHF (congestive  heart failure) (HCC)    swelling of feet & ankles   CMML (chronic myelomonocytic leukemia) (HCC) 06/03/2022   Cough    Depression    OCD   GERD (gastroesophageal reflux disease)    severe   OCD (obsessive compulsive disorder)    Port-A-Cath in place 07/26/2022   Shortness of breath    with exertion   Yeast infection 04/09/2014    Surgical History: Past Surgical History:  Procedure Laterality Date   CARPAL TUNNEL RELEASE  03/22/2012   Procedure: CARPAL TUNNEL RELEASE;  Surgeon: Nicki Reaper, MD;  Location: [facility];  Service: Orthopedics;  Laterality: Right;  right carpal tunnel release   COLONOSCOPY  06/19/2012   Dr. Rourk:normal rectum and colon    ESOPHAGOGASTRODUODENOSCOPY N/A 12/26/2015   Dr. Jena Gauss: normal    IR IMAGING GUIDED PORT INSERTION  07/30/2022   TONSILLECTOMY     TUBAL LIGATION      Social History: Social History   Socioeconomic History   Marital status: Widowed    Spouse name: Not on file   Number of children: 1   Years of education: Not on file   Highest education level: Not on file  Occupational History   Occupation: retired  Tobacco Use   Smoking status: Never    Passive exposure: Yes   Smokeless  tobacco: Never  Vaping Use   Vaping status: Never Used  Substance and Sexual Activity   Alcohol use: No   Drug use: No   Sexual activity: Yes    Birth control/protection: Surgical    Comment: tubal  Other Topics Concern   Not on file  Social History Narrative   Not on file   Social Drivers of Health   Financial Resource Strain: Not on file  Food Insecurity: Not on file  Transportation Needs: Not on file  Physical Activity: Not on file  Stress: Not on file  Social Connections: Not on file  Intimate Partner Violence: Not on file    Family History: Family History  Problem Relation Age of Onset   Asthma Mother    Macular degeneration Mother    Hypertension Mother    Stomach cancer Mother 2   Alzheimer's disease Father    Other Sister        blood clots   Diabetes Maternal Grandfather    Diabetes Paternal Grandfather    Other Son        enlarged spleen   Lymphoma Cousin        x2, both dx in their 11s   Colon cancer Neg Hx     Current Medications:  Current Outpatient Medications:    albuterol (VENTOLIN HFA) 108 (90 Base) MCG/ACT inhaler, INHALE 2 PUFFS BY MOUTH EVERY 6 HOURS AS NEEDED FOR SHORTNESS OF BREATH OR WHEEZING., Disp: 18 g, Rfl: 2   albuterol (VENTOLIN HFA) 108 (90 Base) MCG/ACT inhaler, INHALE TWO PUFFS EVERY 4 HOURS AS NEEDED FOR WHEEZING OR FOR SHORTNESS OF BREATH, Disp: 18 g, Rfl: 1   ARNUITY ELLIPTA 200 MCG/ACT AEPB, Inhale 1 puff into the lungs daily., Disp: 30 each, Rfl: 5   azaCITIDine 5 mg/2 mLs in lactated ringers infusion, Inject into the vein daily. Days 1-7 every 28 days, Disp: , Rfl:    azelastine (ASTELIN) 0.1 % nasal spray, SPRAY 1 TO 2 SPRAYS PER NOSTRIL TWICE DAILY., Disp: 30 mL, Rfl: 3   benzonatate (TESSALON) 200 MG capsule, Take 200 mg by mouth 3 (three) times daily as needed., Disp: ,  Rfl:    BREZTRI AEROSPHERE 160-9-4.8 MCG/ACT AERO, Inhale 2 puffs into the lungs 2 (two) times daily., Disp: 10.7 g, Rfl: 5   cetirizine (ZYRTEC) 5 MG  tablet, Take 1 tablet (5 mg total) by mouth daily., Disp: 30 tablet, Rfl: 0   ciclopirox (PENLAC) 8 % solution, APPLY TOPICALLY AT BEDTIME. APPLY OVER NAIL FOLD AND SURROUNDING SKIN. APPLY DAILY OVER PREVIOUS COAT. AFTER SEVEN DAYS, MAY REMOVE WITH ALCOHOL AND CONTINUE CYCLE., Disp: 6.6 mL, Rfl: 2   EPINEPHrine 0.3 mg/0.3 mL IJ SOAJ injection, Inject 0.3 mg into the muscle as needed for anaphylaxis., Disp: 2 each, Rfl: 1   famotidine (PEPCID) 20 MG tablet, Take 1 tablet (20 mg total) by mouth 2 (two) times daily., Disp: 60 tablet, Rfl: 5   FLUoxetine (PROZAC) 20 MG capsule, Take 1 capsule by mouth daily., Disp: , Rfl:    fluticasone (FLONASE) 50 MCG/ACT nasal spray, Place 2 sprays into both nostrils 2 (two) times daily., Disp: 16 g, Rfl: 5   fluticasone (FLOVENT HFA) 110 MCG/ACT inhaler, Inhale two puffs twice daily to prevent cough or wheeze. Rinse mouth after use., Disp: 12 g, Rfl: 5   folic acid (FOLVITE) 1 MG tablet, TAKE ONE TABLET BY MOUTH ONCE DAILY, Disp: 30 tablet, Rfl: 5   gabapentin (NEURONTIN) 300 MG capsule, , Disp: , Rfl:    HYDROcodone bit-homatropine (HYCODAN) 5-1.5 MG/5ML syrup, , Disp: , Rfl:    ipratropium (ATROVENT) 0.06 % nasal spray, USE 2 SPRAYS IN EACH NOSTRIL THREE TIMES A DAY AS NEEDED., Disp: 15 mL, Rfl: 5   lactulose (CHRONULAC) 10 GM/15ML solution, Take 15 mLs (10 g total) by mouth every 3 (three) hours as needed for mild constipation., Disp: 450 mL, Rfl: 2   lidocaine-prilocaine (EMLA) cream, Apply a quarter sized amount to port a cath site and cover with plastic wrap one hour prior to infusion appointments, Disp: 30 g, Rfl: 3   megestrol (MEGACE) 400 MG/10ML suspension, Take 10 mLs (400 mg total) by mouth 2 (two) times daily., Disp: 480 mL, Rfl: 2   montelukast (SINGULAIR) 10 MG tablet, TAKE ONE TABLET BY MOUTH AT BEDTIME, Disp: 30 tablet, Rfl: 3   NON FORMULARY,  apothecary  Antifungal (nail)-#1, Disp: , Rfl:    pantoprazole (PROTONIX) 40 MG tablet, Take 1  tablet (40 mg total) by mouth daily., Disp: 30 tablet, Rfl: 5   prochlorperazine (COMPAZINE) 10 MG tablet, Take 1 tablet (10 mg total) by mouth every 6 (six) hours as needed for nausea or vomiting., Disp: 30 tablet, Rfl: 0   Respiratory Therapy Supplies (FLUTTER) DEVI, Use as directed, Disp: 1 each, Rfl: 3   SF 5000 PLUS 1.1 % CREA dental cream, , Disp: , Rfl:    Sorbitol 70 % SOLN, Take by mouth., Disp: , Rfl:    Spacer/Aero-Holding Chambers (AEROCHAMBER PLUS WITH MASK) inhaler, 1 each by Other route See admin instructions. Use with inhaler as instructed., Disp: 1 each, Rfl: 1   Tezepelumab-ekko (TEZSPIRE) 210 MG/1. SOAJ, Inject 210 mg into the skin every 28 (twenty-eight) days., Disp: 1.91 mL, Rfl: 11   tiZANidine (ZANAFLEX) 2 MG tablet, Take 1 tablet (2 mg total) by mouth 2 (two) times daily as needed for muscle spasms., Disp: 15 tablet, Rfl: 0   traZODone (DESYREL) 50 MG tablet, TAKE ONE TABLET BY MOUTH AT BEDTIME, Disp: 30 tablet, Rfl: 3  Current Facility-Administered Medications:    tezepelumab-ekko (TEZSPIRE) 210 MG/1. syringe 210 mg, 210 mg, Subcutaneous, Q28 days, Padgett, Pilar Grammes, MD,  210 mg at 08/05/23 1351   Allergies: Allergies  Allergen Reactions   Levonorgestrel-Ethinyl Estrad Cough   Sulfa Antibiotics Other (See Comments)    Unknown- pt unsure of reaction; believes it may be nausea   Sulfamethoxazole-Trimethoprim Other (See Comments)   Cephalosporins Other (See Comments)    Other Reaction: Toxicity    REVIEW OF SYSTEMS:   Review of Systems  Constitutional:  Negative for chills, fatigue and fever.  HENT:   Negative for lump/mass, mouth sores, nosebleeds, sore throat and trouble swallowing.   Eyes:  Negative for eye problems.  Respiratory:  Positive for cough. Negative for shortness of breath.   Cardiovascular:  Negative for chest pain, leg swelling and palpitations.  Gastrointestinal:  Positive for constipation. Negative for abdominal pain, diarrhea,  nausea and vomiting.  Genitourinary:  Negative for bladder incontinence, difficulty urinating, dysuria, frequency, hematuria and nocturia.   Musculoskeletal:  Negative for arthralgias, back pain, flank pain, myalgias and neck pain.  Skin:  Negative for itching and rash.  Neurological:  Negative for dizziness, headaches and numbness.  Hematological:  Does not bruise/bleed easily.  Psychiatric/Behavioral:  Negative for depression, sleep disturbance and suicidal ideas. The patient is not nervous/anxious.   All other systems reviewed and are negative.    VITALS:   There were no vitals taken for this visit.  Wt Readings from Last 3 Encounters:  09/19/23 130 lb 4.7 oz (59.1 kg)  08/29/23 133 lb 8 oz (60.6 kg)  08/22/23 131 lb 6.3 oz (59.6 kg)    There is no height or weight on file to calculate BMI.  Performance status (ECOG): 1 - Symptomatic but completely ambulatory  PHYSICAL EXAM:   Physical Exam Vitals and nursing note reviewed. Exam conducted with a chaperone present.  Constitutional:      Appearance: Normal appearance.  Cardiovascular:     Rate and Rhythm: Normal rate and regular rhythm.     Pulses: Normal pulses.     Heart sounds: Normal heart sounds.  Pulmonary:     Effort: Pulmonary effort is normal.     Breath sounds: Normal breath sounds.  Abdominal:     Palpations: Abdomen is soft. There is no hepatomegaly, splenomegaly or mass.     Tenderness: There is no abdominal tenderness.  Musculoskeletal:     Right lower leg: No edema.     Left lower leg: No edema.  Lymphadenopathy:     Cervical: No cervical adenopathy.     Right cervical: No superficial, deep or posterior cervical adenopathy.    Left cervical: No superficial, deep or posterior cervical adenopathy.     Upper Body:     Right upper body: No supraclavicular or axillary adenopathy.     Left upper body: No supraclavicular or axillary adenopathy.  Neurological:     General: No focal deficit present.     Mental  Status: She is alert and oriented to person, place, and time.  Psychiatric:        Mood and Affect: Mood normal.        Behavior: Behavior normal.     LABS:   CBC     Component Value Date/Time   WBC 2.4 (L) 09/19/2023 0835   RBC 2.82 (L) 09/19/2023 0835   HGB 8.5 (L) 09/19/2023 0835   HGB 13.1 07/23/2020 1112   HCT 27.6 (L) 09/19/2023 0835   HCT 40.7 07/23/2020 1112   PLT 103 (L) 09/19/2023 0835   MCV 97.9 09/19/2023 0835   MCV 95 07/23/2020 1112  MCH 30.1 09/19/2023 0835   MCHC 30.8 09/19/2023 0835   RDW 20.4 (H) 09/19/2023 0835   RDW 14.2 07/23/2020 1112   LYMPHSABS 0.7 09/19/2023 0835   LYMPHSABS 0.7 07/23/2020 1112   MONOABS 0.1 09/19/2023 0835   EOSABS 0.0 09/19/2023 0835   EOSABS 0.1 07/23/2020 1112   BASOSABS 0.0 09/19/2023 0835   BASOSABS 0.0 07/23/2020 1112    CMP      Component Value Date/Time   NA 137 09/19/2023 0837   K 4.0 09/19/2023 0837   CL 103 09/19/2023 0837   CO2 25 09/19/2023 0837   GLUCOSE 126 (H) 09/19/2023 0837   BUN 10 09/19/2023 0837   CREATININE 0.85 09/19/2023 0837   CALCIUM 9.1 09/19/2023 0837   PROT 6.7 09/19/2023 0837   ALBUMIN 4.0 09/19/2023 0837   AST 33 09/19/2023 0837   ALT 15 09/19/2023 0837   ALKPHOS 50 09/19/2023 0837   BILITOT 1.3 (H) 09/19/2023 0837   GFRNONAA >60 09/19/2023 0837   GFRAA >60 02/24/2019 1415     No results found for: "CEA1", "CEA" / No results found for: "CEA1", "CEA" No results found for: "PSA1" No results found for: "CAN199" No results found for: "CAN125"  Lab Results  Component Value Date   TOTALPROTELP 6.3 04/26/2022   ALBUMINELP 3.8 04/26/2022   A1GS 0.3 04/26/2022   A2GS 0.5 04/26/2022   BETS 0.8 04/26/2022   GAMS 0.9 04/26/2022   MSPIKE Not Observed 04/26/2022   SPEI Comment 04/26/2022   Lab Results  Component Value Date   TIBC 343 04/26/2022   FERRITIN 114 04/26/2022   IRONPCTSAT 25 04/26/2022   Lab Results  Component Value Date   LDH 482 (H) 09/19/2023   LDH 677 (H)  08/22/2023   LDH 413 (H) 08/01/2023     STUDIES:   No results found.

## 2023-09-19 ENCOUNTER — Inpatient Hospital Stay: Payer: Medicare HMO | Attending: Hematology

## 2023-09-19 ENCOUNTER — Inpatient Hospital Stay (HOSPITAL_BASED_OUTPATIENT_CLINIC_OR_DEPARTMENT_OTHER): Payer: Medicare HMO | Admitting: Hematology

## 2023-09-19 ENCOUNTER — Encounter: Payer: Self-pay | Admitting: Hematology

## 2023-09-19 ENCOUNTER — Inpatient Hospital Stay (HOSPITAL_BASED_OUTPATIENT_CLINIC_OR_DEPARTMENT_OTHER): Payer: Medicare HMO

## 2023-09-19 VITALS — BP 111/65 | HR 74 | Temp 96.6°F | Resp 20 | Wt 130.3 lb

## 2023-09-19 VITALS — BP 107/65 | HR 69 | Temp 96.6°F | Resp 18 | Ht 61.81 in

## 2023-09-19 DIAGNOSIS — Z825 Family history of asthma and other chronic lower respiratory diseases: Secondary | ICD-10-CM | POA: Diagnosis not present

## 2023-09-19 DIAGNOSIS — N946 Dysmenorrhea, unspecified: Secondary | ICD-10-CM | POA: Insufficient documentation

## 2023-09-19 DIAGNOSIS — D539 Nutritional anemia, unspecified: Secondary | ICD-10-CM | POA: Insufficient documentation

## 2023-09-19 DIAGNOSIS — Z5111 Encounter for antineoplastic chemotherapy: Secondary | ICD-10-CM | POA: Diagnosis not present

## 2023-09-19 DIAGNOSIS — Z800 Family history of malignant neoplasm of digestive organs: Secondary | ICD-10-CM | POA: Diagnosis not present

## 2023-09-19 DIAGNOSIS — R634 Abnormal weight loss: Secondary | ICD-10-CM | POA: Insufficient documentation

## 2023-09-19 DIAGNOSIS — R059 Cough, unspecified: Secondary | ICD-10-CM | POA: Insufficient documentation

## 2023-09-19 DIAGNOSIS — Z9089 Acquired absence of other organs: Secondary | ICD-10-CM | POA: Diagnosis not present

## 2023-09-19 DIAGNOSIS — Z8249 Family history of ischemic heart disease and other diseases of the circulatory system: Secondary | ICD-10-CM | POA: Diagnosis not present

## 2023-09-19 DIAGNOSIS — E538 Deficiency of other specified B group vitamins: Secondary | ICD-10-CM | POA: Insufficient documentation

## 2023-09-19 DIAGNOSIS — K5900 Constipation, unspecified: Secondary | ICD-10-CM | POA: Diagnosis not present

## 2023-09-19 DIAGNOSIS — D7581 Myelofibrosis: Secondary | ICD-10-CM | POA: Diagnosis not present

## 2023-09-19 DIAGNOSIS — D696 Thrombocytopenia, unspecified: Secondary | ICD-10-CM | POA: Insufficient documentation

## 2023-09-19 DIAGNOSIS — Z79899 Other long term (current) drug therapy: Secondary | ICD-10-CM | POA: Insufficient documentation

## 2023-09-19 DIAGNOSIS — Z7951 Long term (current) use of inhaled steroids: Secondary | ICD-10-CM | POA: Diagnosis not present

## 2023-09-19 DIAGNOSIS — Z882 Allergy status to sulfonamides status: Secondary | ICD-10-CM | POA: Insufficient documentation

## 2023-09-19 DIAGNOSIS — K219 Gastro-esophageal reflux disease without esophagitis: Secondary | ICD-10-CM | POA: Diagnosis not present

## 2023-09-19 DIAGNOSIS — C9310 Chronic myelomonocytic leukemia not having achieved remission: Secondary | ICD-10-CM | POA: Insufficient documentation

## 2023-09-19 DIAGNOSIS — Z83518 Family history of other specified eye disorder: Secondary | ICD-10-CM | POA: Insufficient documentation

## 2023-09-19 DIAGNOSIS — Z881 Allergy status to other antibiotic agents status: Secondary | ICD-10-CM | POA: Diagnosis not present

## 2023-09-19 DIAGNOSIS — Z95828 Presence of other vascular implants and grafts: Secondary | ICD-10-CM | POA: Diagnosis not present

## 2023-09-19 DIAGNOSIS — Z807 Family history of other malignant neoplasms of lymphoid, hematopoietic and related tissues: Secondary | ICD-10-CM | POA: Diagnosis not present

## 2023-09-19 DIAGNOSIS — Z833 Family history of diabetes mellitus: Secondary | ICD-10-CM | POA: Diagnosis not present

## 2023-09-19 LAB — CBC WITH DIFFERENTIAL/PLATELET
Abs Immature Granulocytes: 0.2 10*3/uL — ABNORMAL HIGH (ref 0.00–0.07)
Basophils Absolute: 0 10*3/uL (ref 0.0–0.1)
Basophils Relative: 1 %
Eosinophils Absolute: 0 10*3/uL (ref 0.0–0.5)
Eosinophils Relative: 1 %
HCT: 27.6 % — ABNORMAL LOW (ref 36.0–46.0)
Hemoglobin: 8.5 g/dL — ABNORMAL LOW (ref 12.0–15.0)
Lymphocytes Relative: 28 %
Lymphs Abs: 0.7 10*3/uL (ref 0.7–4.0)
MCH: 30.1 pg (ref 26.0–34.0)
MCHC: 30.8 g/dL (ref 30.0–36.0)
MCV: 97.9 fL (ref 80.0–100.0)
Metamyelocytes Relative: 5 %
Monocytes Absolute: 0.1 10*3/uL (ref 0.1–1.0)
Monocytes Relative: 4 %
Myelocytes: 3 %
Neutro Abs: 1.4 10*3/uL — ABNORMAL LOW (ref 1.7–7.7)
Neutrophils Relative %: 58 %
Platelets: 103 10*3/uL — ABNORMAL LOW (ref 150–400)
RBC: 2.82 MIL/uL — ABNORMAL LOW (ref 3.87–5.11)
RDW: 20.4 % — ABNORMAL HIGH (ref 11.5–15.5)
WBC: 2.4 10*3/uL — ABNORMAL LOW (ref 4.0–10.5)
nRBC: 2 /100{WBCs} — ABNORMAL HIGH
nRBC: 2.5 % — ABNORMAL HIGH (ref 0.0–0.2)

## 2023-09-19 LAB — COMPREHENSIVE METABOLIC PANEL
ALT: 15 U/L (ref 0–44)
AST: 33 U/L (ref 15–41)
Albumin: 4 g/dL (ref 3.5–5.0)
Alkaline Phosphatase: 50 U/L (ref 38–126)
Anion gap: 9 (ref 5–15)
BUN: 10 mg/dL (ref 8–23)
CO2: 25 mmol/L (ref 22–32)
Calcium: 9.1 mg/dL (ref 8.9–10.3)
Chloride: 103 mmol/L (ref 98–111)
Creatinine, Ser: 0.85 mg/dL (ref 0.44–1.00)
GFR, Estimated: >60 mL/min (ref >60)
Glucose, Bld: 126 mg/dL — ABNORMAL HIGH (ref 70–99)
Potassium: 4 mmol/L (ref 3.5–5.1)
Sodium: 137 mmol/L (ref 135–145)
Total Bilirubin: 1.3 mg/dL — ABNORMAL HIGH (ref 0.0–1.2)
Total Protein: 6.7 g/dL (ref 6.5–8.1)

## 2023-09-19 LAB — MAGNESIUM: Magnesium: 1.9 mg/dL (ref 1.7–2.4)

## 2023-09-19 LAB — LACTATE DEHYDROGENASE: LDH: 482 U/L — ABNORMAL HIGH (ref 98–192)

## 2023-09-19 MED ORDER — SODIUM CHLORIDE 0.9 % IV SOLN: Freq: Once | INTRAVENOUS | Status: AC

## 2023-09-19 MED ORDER — SODIUM CHLORIDE 0.9 % IV SOLN
75.0000 mg/m2 | Freq: Once | INTRAVENOUS | Status: AC
  Administered 2023-09-19: 126 mg via INTRAVENOUS
  Filled 2023-09-19: qty 12.6

## 2023-09-19 MED ORDER — HEPARIN SOD (PORK) LOCK FLUSH 100 UNIT/ML IV SOLN
500.0000 [IU] | Freq: Once | INTRAVENOUS | Status: AC
  Administered 2023-09-19: 500 [IU] via INTRAVENOUS

## 2023-09-19 MED ORDER — SODIUM CHLORIDE 0.9% FLUSH
10.0000 mL | Freq: Once | INTRAVENOUS | Status: AC
  Administered 2023-09-19: 10 mL via INTRAVENOUS

## 2023-09-19 MED ORDER — PALONOSETRON HCL INJECTION 0.25 MG/5ML
0.2500 mg | Freq: Once | INTRAVENOUS | Status: AC
  Administered 2023-09-19: 0.25 mg via INTRAVENOUS
  Filled 2023-09-19: qty 5

## 2023-09-19 MED ORDER — SODIUM CHLORIDE 0.9% FLUSH
10.0000 mL | INTRAVENOUS | Status: AC
  Administered 2023-09-19: 10 mL

## 2023-09-19 NOTE — Progress Notes (Signed)
Patient has been examined by Dr. Katragadda. Vital signs and labs have been reviewed by MD - ANC (1.4), Creatinine, LFTs, hemoglobin, and platelets are within treatment parameters per M.D. - pt may proceed with treatment.  Primary RN and pharmacy notified.  

## 2023-09-19 NOTE — Patient Instructions (Signed)

## 2023-09-19 NOTE — Patient Instructions (Signed)
 [facility] [place] - A DEPT OF [facility]  Discharge Instructions: Thank you for choosing [facility] to provide your oncology and hematology care.  If you have a lab appointment with the Cancer Center - please note that after April 8th, 2024, all labs will be drawn in the cancer center.  You do not have to check in or register with the main entrance as you have in the past but will complete your check-in in the cancer center.  Wear comfortable clothing and clothing appropriate for easy access to any Portacath or PICC line.   We strive to give you quality time with your provider. You may need to reschedule your appointment if you arrive late (15 or more minutes).  Arriving late affects you and other patients whose appointments are after yours.  Also, if you miss three or more appointments without notifying the office, you may be dismissed from the clinic at the provider's discretion.      For prescription refill requests, have your pharmacy contact our office and allow 72 hours for refills to be completed.    Today you received the following chemotherapy and/or immunotherapy agents Vidaza.  Azacitidine Injection What is this medication? AZACITIDINE (ay za SITE i deen) treats blood and bone marrow cancers. It works by slowing down the growth of cancer cells. This medicine may be used for other purposes; ask your health care provider or pharmacist if you have questions. COMMON BRAND NAME(S): Vidaza What should I tell my care team before I take this medication? They need to know if you have any of these conditions: Kidney disease Liver disease Low blood cell levels, such as low white cells, platelets, or red blood cells Low levels of albumin in the blood Low levels of bicarbonate in the blood An unusual or allergic reaction to azacitidine, mannitol, other medications, foods, dyes, or preservatives If you or your partner are pregnant or trying to get  pregnant Breast-feeding How should I use this medication? This medication is injected into a vein or under the skin. It is given by your care team in a hospital or clinic setting. Talk to your care team about the use of this medication in children. While it may be prescribed for children as young as 1 month for selected conditions, precautions do apply. Overdosage: If you think you have taken too much of this medicine contact a poison control center or emergency room at once. NOTE: This medicine is only for you. Do not share this medicine with others. What if I miss a dose? Keep appointments for follow-up doses. It is important not to miss your dose. Call your care team if you are unable to keep an appointment. What may interact with this medication? Interactions are not expected. This list may not describe all possible interactions. Give your health care provider a list of all the medicines, herbs, non-prescription drugs, or dietary supplements you use. Also tell them if you smoke, drink alcohol, or use illegal drugs. Some items may interact with your medicine. What should I watch for while using this medication? Your condition will be monitored carefully while you are receiving this medication. This medication may make you feel generally unwell. This is not uncommon as chemotherapy can affect healthy cells as well as cancer cells. Report any side effects. Continue your course of treatment even though you feel ill unless your care team tells you to stop. You may need blood work done while you are  taking this medication. Other product types may be available that contain the medication azacitidine. The injection and oral products should not be used in place of one another. Talk to your care team if you have questions. This medication can cause serious side effects. To reduce the risk, your care team may give you other medications to take before receiving this one. Be sure to follow the directions from  your care team. This medication may increase your risk of getting an infection. Call your care team for advice if you get a fever, chills, sore throat, or other symptoms of a cold or flu. Do not treat yourself. Try to avoid being around people who are sick. Avoid taking medications that contain aspirin, acetaminophen, ibuprofen, naproxen, or ketoprofen unless instructed by your care team. These medications may hide a fever. Be careful brushing or flossing your teeth or using a toothpick because you may get an infection or bleed more easily. If you have any dental work done, tell your dentist you are receiving this medication. Talk to your care team if you or your partner may be pregnant. Serious birth defects can occur if you take this medication during pregnancy and for 6 months after the last dose. You will need a negative pregnancy test before starting this medication. Contraception is recommended while taking this medication and for 6 months after the last dose. Your care team can help you find the option that works for you. If your partner can get pregnant, use a condom during sex while taking this medication and for 3 months after the last dose. Do not breastfeed while taking this medication and for 1 week after the last dose. This medication may cause infertility. Talk to your care team if you are concerned about your fertility. What side effects may I notice from receiving this medication? Side effects that you should report to your care team as soon as possible: Allergic reactions--skin rash, itching, hives, swelling of the face, lips, tongue, or throat Infection--fever, chills, cough, sore throat, wounds that don't heal, pain or trouble when passing urine, general feeling of discomfort or being unwell Kidney injury--decrease in the amount of urine, swelling of the ankles, hands, or feet Liver injury--right upper belly pain, loss of appetite, nausea, light-colored stool, dark yellow or [name]  urine, yellowing skin or eyes, unusual weakness or fatigue Low red blood cell level--unusual weakness or fatigue, dizziness, headache, trouble breathing Tumor lysis syndrome (TLS)--nausea, vomiting, diarrhea, decrease in the amount of urine, dark urine, unusual weakness or fatigue, confusion, muscle pain or cramps, fast or irregular heartbeat, joint pain Unusual bruising or bleeding Side effects that usually do not require medical attention (report to your care team if they continue or are bothersome): Constipation Diarrhea Nausea Pain, redness, or irritation at injection site Vomiting This list may not describe all possible side effects. Call your doctor for medical advice about side effects. You may report side effects to FDA at 1-800-FDA-1088. Where should I keep my medication? This medication is given in a hospital or clinic. It will not be stored at home. NOTE: This sheet is a summary. It may not cover all possible information. If you have questions about this medicine, talk to your doctor, pharmacist, or health care provider.  2024 Elsevier/Gold Standard (2023-03-07 00:00:00)       To help prevent nausea and vomiting after your treatment, we encourage you to take your nausea medication as directed.  BELOW ARE SYMPTOMS THAT SHOULD BE REPORTED IMMEDIATELY: *FEVER GREATER THAN 100.4  F (38 C) OR HIGHER *CHILLS OR SWEATING *NAUSEA AND VOMITING THAT IS NOT CONTROLLED WITH YOUR NAUSEA MEDICATION *UNUSUAL SHORTNESS OF BREATH *UNUSUAL BRUISING OR BLEEDING *URINARY PROBLEMS (pain or burning when urinating, or frequent urination) *BOWEL PROBLEMS (unusual diarrhea, constipation, pain near the anus) TENDERNESS IN MOUTH AND THROAT WITH OR WITHOUT PRESENCE OF ULCERS (sore throat, sores in mouth, or a toothache) UNUSUAL RASH, SWELLING OR PAIN  UNUSUAL VAGINAL DISCHARGE OR ITCHING   Items with * indicate a potential emergency and should be followed up as soon as possible or go to the Emergency  Department if any problems should occur.  Please show the CHEMOTHERAPY ALERT CARD or IMMUNOTHERAPY ALERT CARD at check-in to the Emergency Department and triage nurse.  Should you have questions after your visit or need to cancel or reschedule your appointment, please contact [facility] [place] - A DEPT OF [facility] [phone]  and follow the prompts.  Office hours are 8:00 a.m. to 4:30 p.m. Monday - Friday. Please note that voicemails left after 4:00 p.m. may not be returned until the following business day.  We are closed weekends and major holidays. You have access to a nurse at all times for urgent questions. Please call the main number to the clinic [phone] and follow the prompts.  For any non-urgent questions, you may also contact your provider using MyChart. We now offer e-Visits for anyone 88 and older to request care online for non-urgent symptoms. For details visit [email or web address].   Also download the MyChart app! Go to the app store, search "MyChart", open the app, select [place], and log in with your MyChart username and password.

## 2023-09-19 NOTE — Progress Notes (Signed)
 Patient presents today for chemotherapy infusion of Vidaza. Patient is in satisfactory condition with no new complaints voiced.  Vital signs are stable.  Labs reviewed by Dr. Ellin Saba during the office visit and all labs are within treatment parameters with exception of ANC 1.4. Per Dr Ellin Saba okay to treat.  We will proceed with treatment per MD orders.   Patient tolerated treatment well with no complaints voiced.  Patient left ambulatory in stable condition.  Vital signs stable at discharge.  Follow up as scheduled.

## 2023-09-20 ENCOUNTER — Inpatient Hospital Stay: Payer: Medicare HMO

## 2023-09-20 ENCOUNTER — Other Ambulatory Visit: Payer: Self-pay

## 2023-09-20 VITALS — BP 102/50 | HR 71 | Temp 98.3°F | Resp 18

## 2023-09-20 DIAGNOSIS — C9310 Chronic myelomonocytic leukemia not having achieved remission: Secondary | ICD-10-CM | POA: Diagnosis not present

## 2023-09-20 DIAGNOSIS — E538 Deficiency of other specified B group vitamins: Secondary | ICD-10-CM | POA: Diagnosis not present

## 2023-09-20 DIAGNOSIS — Z5111 Encounter for antineoplastic chemotherapy: Secondary | ICD-10-CM | POA: Diagnosis not present

## 2023-09-20 DIAGNOSIS — D696 Thrombocytopenia, unspecified: Secondary | ICD-10-CM | POA: Diagnosis not present

## 2023-09-20 DIAGNOSIS — D7581 Myelofibrosis: Secondary | ICD-10-CM | POA: Diagnosis not present

## 2023-09-20 DIAGNOSIS — N946 Dysmenorrhea, unspecified: Secondary | ICD-10-CM | POA: Diagnosis not present

## 2023-09-20 DIAGNOSIS — K5900 Constipation, unspecified: Secondary | ICD-10-CM | POA: Diagnosis not present

## 2023-09-20 DIAGNOSIS — R059 Cough, unspecified: Secondary | ICD-10-CM | POA: Diagnosis not present

## 2023-09-20 DIAGNOSIS — D539 Nutritional anemia, unspecified: Secondary | ICD-10-CM | POA: Diagnosis not present

## 2023-09-20 DIAGNOSIS — Z95828 Presence of other vascular implants and grafts: Secondary | ICD-10-CM

## 2023-09-20 MED ORDER — SODIUM CHLORIDE 0.9% FLUSH
10.0000 mL | INTRAVENOUS | Status: AC
  Administered 2023-09-20: 10 mL via INTRAVENOUS

## 2023-09-20 MED ORDER — HEPARIN SOD (PORK) LOCK FLUSH 100 UNIT/ML IV SOLN
500.0000 [IU] | Freq: Once | INTRAVENOUS | Status: AC
  Administered 2023-09-20: 500 [IU] via INTRAVENOUS

## 2023-09-20 MED ORDER — SODIUM CHLORIDE 0.9% FLUSH
10.0000 mL | Freq: Once | INTRAVENOUS | Status: AC
  Administered 2023-09-20: 10 mL via INTRAVENOUS

## 2023-09-20 MED ORDER — AZACITIDINE CHEMO INJECTION 100 MG
75.0000 mg/m2 | Freq: Once | INTRAMUSCULAR | Status: AC
  Administered 2023-09-20: 126 mg via INTRAVENOUS
  Filled 2023-09-20: qty 12.6

## 2023-09-20 MED ORDER — SODIUM CHLORIDE 0.9 % IV SOLN
Freq: Once | INTRAVENOUS | Status: AC
Start: 2023-09-20 — End: 2023-09-20

## 2023-09-20 NOTE — Progress Notes (Signed)
 Patient presents today for D2 C14 Vidaza. Vital signs within parameters for treatment. MAR reviewed and updated. Patient denies any changes since yesterday's treatment.   Treatment given today per MD orders. Tolerated infusion without adverse affects. Vital signs stable. No complaints at this time. Discharged from clinic ambulatory in stable condition. Alert and oriented x 3. F/U with [facility] as scheduled.

## 2023-09-20 NOTE — Patient Instructions (Signed)
 [facility] [place] - [facility]  Discharge Instructions: Thank you for choosing [facility] to provide your oncology and hematology care.  If you have a lab appointment with the Cancer Center - please note that after April 8th, 2024, all labs will be drawn in the cancer center.  You do not have to check in or register with the main entrance as you have in the past but will complete your check-in in the cancer center.  Wear comfortable clothing and clothing appropriate for easy access to any Portacath or PICC line.   We strive to give you quality time with your provider. You may need to reschedule your appointment if you arrive late (15 or more minutes).  Arriving late affects you and other patients whose appointments are after yours.  Also, if you miss three or more appointments without notifying the office, you may be dismissed from the clinic at the provider's discretion.      For prescription refill requests, have your pharmacy contact our office and allow 72 hours for refills to be completed.    Today you received the following chemotherapy and/or immunotherapy agents Vidaza.  Azacitidine Injection What is this medication? AZACITIDINE (ay za SITE i deen) treats blood and bone marrow cancers. It works by slowing down the growth of cancer cells. This medicine may be used for other purposes; ask your health care provider or pharmacist if you have questions. COMMON BRAND NAME(S): Vidaza What should I tell my care team before I take this medication? They need to know if you have any of these conditions: Kidney disease Liver disease Low blood cell levels, such as low white cells, platelets, or red blood cells Low levels of albumin in the blood Low levels of bicarbonate in the blood An unusual or allergic reaction to azacitidine, mannitol, other medications, foods, dyes, or preservatives If you or your partner are pregnant or trying to get  pregnant Breast-feeding How should I use this medication? This medication is injected into a vein or under the skin. It is given by your care team in a hospital or clinic setting. Talk to your care team about the use of this medication in children. While it may be prescribed for children as young as 1 month for selected conditions, precautions do apply. Overdosage: If you think you have taken too much of this medicine contact a poison control center or emergency room at once. NOTE: This medicine is only for you. Do not share this medicine with others. What if I miss a dose? Keep appointments for follow-up doses. It is important not to miss your dose. Call your care team if you are unable to keep an appointment. What may interact with this medication? Interactions are not expected. This list may not describe all possible interactions. Give your health care provider a list of all the medicines, herbs, non-prescription drugs, or dietary supplements you use. Also tell them if you smoke, drink alcohol, or use illegal drugs. Some items may interact with your medicine. What should I watch for while using this medication? Your condition will be monitored carefully while you are receiving this medication. This medication may make you feel generally unwell. This is not uncommon as chemotherapy can affect healthy cells as well as cancer cells. Report any side effects. Continue your course of treatment even though you feel ill unless your care team tells you to stop. You may need blood work done while you are  taking this medication. Other product types may be available that contain the medication azacitidine. The injection and oral products should not be used in place of one another. Talk to your care team if you have questions. This medication can cause serious side effects. To reduce the risk, your care team may give you other medications to take before receiving this one. Be sure to follow the directions from  your care team. This medication may increase your risk of getting an infection. Call your care team for advice if you get a fever, chills, sore throat, or other symptoms of a cold or flu. Do not treat yourself. Try to avoid being around people who are sick. Avoid taking medications that contain aspirin, acetaminophen, ibuprofen, naproxen, or ketoprofen unless instructed by your care team. These medications may hide a fever. Be careful brushing or flossing your teeth or using a toothpick because you may get an infection or bleed more easily. If you have any dental work done, tell your dentist you are receiving this medication. Talk to your care team if you or your partner may be pregnant. Serious birth defects can occur if you take this medication during pregnancy and for 6 months after the last dose. You will need a negative pregnancy test before starting this medication. Contraception is recommended while taking this medication and for 6 months after the last dose. Your care team can help you find the option that works for you. If your partner can get pregnant, use a condom during sex while taking this medication and for 3 months after the last dose. Do not breastfeed while taking this medication and for 1 week after the last dose. This medication may cause infertility. Talk to your care team if you are concerned about your fertility. What side effects may I notice from receiving this medication? Side effects that you should report to your care team as soon as possible: Allergic reactions--skin rash, itching, hives, swelling of the face, lips, tongue, or throat Infection--fever, chills, cough, sore throat, wounds that don't heal, pain or trouble when passing urine, general feeling of discomfort or being unwell Kidney injury--decrease in the amount of urine, swelling of the ankles, hands, or feet Liver injury--right upper belly pain, loss of appetite, nausea, light-colored stool, dark yellow or Iyla Balzarini  urine, yellowing skin or eyes, unusual weakness or fatigue Low red blood cell level--unusual weakness or fatigue, dizziness, headache, trouble breathing Tumor lysis syndrome (TLS)--nausea, vomiting, diarrhea, decrease in the amount of urine, dark urine, unusual weakness or fatigue, confusion, muscle pain or cramps, fast or irregular heartbeat, joint pain Unusual bruising or bleeding Side effects that usually do not require medical attention (report to your care team if they continue or are bothersome): Constipation Diarrhea Nausea Pain, redness, or irritation at injection site Vomiting This list may not describe all possible side effects. Call your doctor for medical advice about side effects. You may report side effects to FDA at 1-800-FDA-1088. Where should I keep my medication? This medication is given in a hospital or clinic. It will not be stored at home. NOTE: This sheet is a summary. It may not cover all possible information. If you have questions about this medicine, talk to your doctor, pharmacist, or health care provider.  2024 Elsevier/Gold Standard (2023-03-07 00:00:00)       To help prevent nausea and vomiting after your treatment, we encourage you to take your nausea medication as directed.  BELOW ARE SYMPTOMS THAT SHOULD BE REPORTED IMMEDIATELY: *FEVER GREATER THAN 100.4  F (38 C) OR HIGHER *CHILLS OR SWEATING *NAUSEA AND VOMITING THAT IS NOT CONTROLLED WITH YOUR NAUSEA MEDICATION *UNUSUAL SHORTNESS OF BREATH *UNUSUAL BRUISING OR BLEEDING *URINARY PROBLEMS (pain or burning when urinating, or frequent urination) *BOWEL PROBLEMS (unusual diarrhea, constipation, pain near the anus) TENDERNESS IN MOUTH AND THROAT WITH OR WITHOUT PRESENCE OF ULCERS (sore throat, sores in mouth, or a toothache) UNUSUAL RASH, SWELLING OR PAIN  UNUSUAL VAGINAL DISCHARGE OR ITCHING   Items with * indicate a potential emergency and should be followed up as soon as possible or go to the Emergency  Department if any problems should occur.  Please show the CHEMOTHERAPY ALERT CARD or IMMUNOTHERAPY ALERT CARD at check-in to the Emergency Department and triage nurse.  Should you have questions after your visit or need to cancel or reschedule your appointment, please contact [facility] CANCER CTR [place] - A DEPT OF Eligha Bridegroom [facility] [phone]  and follow the prompts.  Office hours are 8:00 a.m. to 4:30 p.m. Monday - Friday. Please note that voicemails left after 4:00 p.m. may not be returned until the following business day.  We are closed weekends and major holidays. You have access to a nurse at all times for urgent questions. Please call the main number to the clinic [phone] and follow the prompts.  For any non-urgent questions, you may also contact your provider using MyChart. We now offer e-Visits for anyone 88 and older to request care online for non-urgent symptoms. For details visit [email or web address].   Also download the MyChart app! Go to the app store, search "MyChart", open the app, select [place], and log in with your MyChart username and password.

## 2023-09-21 ENCOUNTER — Inpatient Hospital Stay: Payer: Medicare HMO

## 2023-09-21 VITALS — BP 97/65 | HR 79 | Temp 98.0°F | Resp 18

## 2023-09-21 DIAGNOSIS — Z5111 Encounter for antineoplastic chemotherapy: Secondary | ICD-10-CM | POA: Diagnosis not present

## 2023-09-21 DIAGNOSIS — D539 Nutritional anemia, unspecified: Secondary | ICD-10-CM | POA: Diagnosis not present

## 2023-09-21 DIAGNOSIS — C9310 Chronic myelomonocytic leukemia not having achieved remission: Secondary | ICD-10-CM | POA: Diagnosis not present

## 2023-09-21 DIAGNOSIS — E538 Deficiency of other specified B group vitamins: Secondary | ICD-10-CM | POA: Diagnosis not present

## 2023-09-21 DIAGNOSIS — D7581 Myelofibrosis: Secondary | ICD-10-CM | POA: Diagnosis not present

## 2023-09-21 DIAGNOSIS — Z95828 Presence of other vascular implants and grafts: Secondary | ICD-10-CM

## 2023-09-21 DIAGNOSIS — D696 Thrombocytopenia, unspecified: Secondary | ICD-10-CM | POA: Diagnosis not present

## 2023-09-21 DIAGNOSIS — N946 Dysmenorrhea, unspecified: Secondary | ICD-10-CM | POA: Diagnosis not present

## 2023-09-21 DIAGNOSIS — K5900 Constipation, unspecified: Secondary | ICD-10-CM | POA: Diagnosis not present

## 2023-09-21 DIAGNOSIS — R059 Cough, unspecified: Secondary | ICD-10-CM | POA: Diagnosis not present

## 2023-09-21 MED ORDER — SODIUM CHLORIDE 0.9 % IV SOLN: INTRAVENOUS | Status: DC

## 2023-09-21 MED ORDER — HEPARIN SOD (PORK) LOCK FLUSH 100 UNIT/ML IV SOLN
500.0000 [IU] | Freq: Once | INTRAVENOUS | Status: AC
  Administered 2023-09-21: 500 [IU] via INTRAVENOUS

## 2023-09-21 MED ORDER — SODIUM CHLORIDE 0.9% FLUSH
10.0000 mL | Freq: Once | INTRAVENOUS | Status: AC
  Administered 2023-09-21: 10 mL via INTRAVENOUS

## 2023-09-21 MED ORDER — AZACITIDINE CHEMO INJECTION 100 MG
75.0000 mg/m2 | Freq: Once | INTRAMUSCULAR | Status: AC
  Administered 2023-09-21: 126 mg via INTRAVENOUS
  Filled 2023-09-21: qty 12.6

## 2023-09-21 MED ORDER — PALONOSETRON HCL INJECTION 0.25 MG/5ML
0.2500 mg | Freq: Once | INTRAVENOUS | Status: AC
  Administered 2023-09-21: 0.25 mg via INTRAVENOUS
  Filled 2023-09-21: qty 5

## 2023-09-21 NOTE — Progress Notes (Signed)
Vidaza given today per MD orders. Tolerated infusion without adverse affects. Vital signs stable. No complaints at this time. Discharged from clinic ambulatory in stable condition. Alert and oriented x 3. F/U with [facility] as scheduled.   

## 2023-09-21 NOTE — Patient Instructions (Signed)
 [facility] [place] - [facility] DE[facility] OF [facility]  Discharge Instructions: Thank you for choosing [facility] to provide your oncology and hematology care.  If you have a lab appointment with the Cancer Center - please note that after April 8th, 2024, all labs will be drawn in the cancer center.  You do not have to check in or register with the main entrance as you have in the past but will complete your check-in in the cancer center.  Wear comfortable clothing and clothing appropriate for easy access to any Portacath or PICC line.   We strive to give you quality time with your provider. You may need to reschedule your appointment if you arrive late (15 or more minutes).  Arriving late affects you and other patients whose appointments are after yours.  Also, if you miss three or more appointments without notifying the office, you may be dismissed from the clinic at the provider's discretion.      For prescription refill requests, have your pharmacy contact our office and allow 72 hours for refills to be completed.    Today you received the following chemotherapy and/or immunotherapy agents Vidaza.  Azacitidine Injection What is this medication? AZACITIDINE (ay za SITE i deen) treats blood and bone marrow cancers. It works by slowing down the growth of cancer cells. This medicine may be used for other purposes; ask your health care provider or pharmacist if you have questions. COMMON BRAND NAME(S): Vidaza What should I tell my care team before I take this medication? They need to know if you have any of these conditions: Kidney disease Liver disease Low blood cell levels, such as low white cells, platelets, or red blood cells Low levels of albumin in the blood Low levels of bicarbonate in the blood An unusual or allergic reaction to azacitidine, mannitol, other medications, foods, dyes, or preservatives If you or your partner are pregnant or trying to get  pregnant Breast-feeding How should I use this medication? This medication is injected into a vein or under the skin. It is given by your care team in a hospital or clinic setting. Talk to your care team about the use of this medication in children. While it may be prescribed for children as young as 1 month for selected conditions, precautions do apply. Overdosage: If you think you have taken too much of this medicine contact a poison control center or emergency room at once. NOTE: This medicine is only for you. Do not share this medicine with others. What if I miss a dose? Keep appointments for follow-up doses. It is important not to miss your dose. Call your care team if you are unable to keep an appointment. What may interact with this medication? Interactions are not expected. This list may not describe all possible interactions. Give your health care provider a list of all the medicines, herbs, non-prescription drugs, or dietary supplements you use. Also tell them if you smoke, drink alcohol, or use illegal drugs. Some items may interact with your medicine. What should I watch for while using this medication? Your condition will be monitored carefully while you are receiving this medication. This medication may make you feel generally unwell. This is not uncommon as chemotherapy can affect healthy cells as well as cancer cells. Report any side effects. Continue your course of treatment even though you feel ill unless your care team tells you to stop. You may need blood work done while you are  taking this medication. Other product types may be available that contain the medication azacitidine. The injection and oral products should not be used in place of one another. Talk to your care team if you have questions. This medication can cause serious side effects. To reduce the risk, your care team may give you other medications to take before receiving this one. Be sure to follow the directions from  your care team. This medication may increase your risk of getting an infection. Call your care team for advice if you get a fever, chills, sore throat, or other symptoms of a cold or flu. Do not treat yourself. Try to avoid being around people who are sick. Avoid taking medications that contain aspirin, acetaminophen, ibuprofen, naproxen, or ketoprofen unless instructed by your care team. These medications may hide a fever. Be careful brushing or flossing your teeth or using a toothpick because you may get an infection or bleed more easily. If you have any dental work done, tell your dentist you are receiving this medication. Talk to your care team if you or your partner may be pregnant. Serious birth defects can occur if you take this medication during pregnancy and for 6 months after the last dose. You will need a negative pregnancy test before starting this medication. Contraception is recommended while taking this medication and for 6 months after the last dose. Your care team can help you find the option that works for you. If your partner can get pregnant, use a condom during sex while taking this medication and for 3 months after the last dose. Do not breastfeed while taking this medication and for 1 week after the last dose. This medication may cause infertility. Talk to your care team if you are concerned about your fertility. What side effects may I notice from receiving this medication? Side effects that you should report to your care team as soon as possible: Allergic reactions--skin rash, itching, hives, swelling of the face, lips, tongue, or throat Infection--fever, chills, cough, sore throat, wounds that don't heal, pain or trouble when passing urine, general feeling of discomfort or being unwell Kidney injury--decrease in the amount of urine, swelling of the ankles, hands, or feet Liver injury--right upper belly pain, loss of appetite, nausea, light-colored stool, dark yellow or [name]  urine, yellowing skin or eyes, unusual weakness or fatigue Low red blood cell level--unusual weakness or fatigue, dizziness, headache, trouble breathing Tumor lysis syndrome (TLS)--nausea, vomiting, diarrhea, decrease in the amount of urine, dark urine, unusual weakness or fatigue, confusion, muscle pain or cramps, fast or irregular heartbeat, joint pain Unusual bruising or bleeding Side effects that usually do not require medical attention (report to your care team if they continue or are bothersome): Constipation Diarrhea Nausea Pain, redness, or irritation at injection site Vomiting This list may not describe all possible side effects. Call your doctor for medical advice about side effects. You may report side effects to FDA at 1-800-FDA-1088. Where should I keep my medication? This medication is given in a hospital or clinic. It will not be stored at home. NOTE: This sheet is a summary. It may not cover all possible information. If you have questions about this medicine, talk to your doctor, pharmacist, or health care provider.  2024 Elsevier/Gold Standard (2023-03-07 00:00:00)       To help prevent nausea and vomiting after your treatment, we encourage you to take your nausea medication as directed.  BELOW ARE SYMPTOMS THAT SHOULD BE REPORTED IMMEDIATELY: *FEVER GREATER THAN 100.4  F (38 C) OR HIGHER *CHILLS OR SWEATING *NAUSEA AND VOMITING THAT IS NOT CONTROLLED WITH YOUR NAUSEA MEDICATION *UNUSUAL SHORTNESS OF BREATH *UNUSUAL BRUISING OR BLEEDING *URINARY PROBLEMS (pain or burning when urinating, or frequent urination) *BOWEL PROBLEMS (unusual diarrhea, constipation, pain near the anus) TENDERNESS IN MOUTH AND THROAT WITH OR WITHOUT PRESENCE OF ULCERS (sore throat, sores in mouth, or a toothache) UNUSUAL RASH, SWELLING OR PAIN  UNUSUAL VAGINAL DISCHARGE OR ITCHING   Items with * indicate a potential emergency and should be followed up as soon as possible or go to the Emergency  Department if any problems should occur.  Please show the CHEMOTHERAPY ALERT CARD or IMMUNOTHERAPY ALERT CARD at check-in to the Emergency Department and triage nurse.  Should you have questions after your visit or need to cancel or reschedule your appointment, please contact [facility] [place] - A DEPT OF [name] [phone]  and follow the prompts.  Office hours are 8:00 a.m. to 4:30 p.m. Monday - Friday. Please note that voicemails left after 4:00 p.m. may not be returned until the following business day.  We are closed weekends and major holidays. You have access to a nurse at all times for urgent questions. Please call the main number to the clinic [phone] and follow the prompts.  For any non-urgent questions, you may also contact your provider using MyChart. We now offer e-Visits for anyone 88 and older to request care online for non-urgent symptoms. For details visit [email or web address].   Also download the MyChart app! Go to the app store, search "MyChart", open the app, select [place], and log in with your MyChart username and password.

## 2023-09-22 ENCOUNTER — Other Ambulatory Visit: Payer: Self-pay

## 2023-09-22 ENCOUNTER — Inpatient Hospital Stay: Payer: Medicare HMO

## 2023-09-22 VITALS — BP 111/74 | HR 76 | Temp 97.7°F | Resp 18

## 2023-09-22 DIAGNOSIS — N946 Dysmenorrhea, unspecified: Secondary | ICD-10-CM | POA: Diagnosis not present

## 2023-09-22 DIAGNOSIS — C9310 Chronic myelomonocytic leukemia not having achieved remission: Secondary | ICD-10-CM | POA: Diagnosis not present

## 2023-09-22 DIAGNOSIS — D539 Nutritional anemia, unspecified: Secondary | ICD-10-CM | POA: Diagnosis not present

## 2023-09-22 DIAGNOSIS — D7581 Myelofibrosis: Secondary | ICD-10-CM | POA: Diagnosis not present

## 2023-09-22 DIAGNOSIS — D696 Thrombocytopenia, unspecified: Secondary | ICD-10-CM | POA: Diagnosis not present

## 2023-09-22 DIAGNOSIS — K5900 Constipation, unspecified: Secondary | ICD-10-CM | POA: Diagnosis not present

## 2023-09-22 DIAGNOSIS — E538 Deficiency of other specified B group vitamins: Secondary | ICD-10-CM | POA: Diagnosis not present

## 2023-09-22 DIAGNOSIS — R059 Cough, unspecified: Secondary | ICD-10-CM | POA: Diagnosis not present

## 2023-09-22 DIAGNOSIS — Z95828 Presence of other vascular implants and grafts: Secondary | ICD-10-CM

## 2023-09-22 DIAGNOSIS — Z5111 Encounter for antineoplastic chemotherapy: Secondary | ICD-10-CM | POA: Diagnosis not present

## 2023-09-22 MED ORDER — SODIUM CHLORIDE 0.9 % IV SOLN
75.0000 mg/m2 | Freq: Once | INTRAVENOUS | Status: AC
  Administered 2023-09-22: 126 mg via INTRAVENOUS
  Filled 2023-09-22: qty 12.6

## 2023-09-22 MED ORDER — HEPARIN SOD (PORK) LOCK FLUSH 100 UNIT/ML IV SOLN
500.0000 [IU] | Freq: Once | INTRAVENOUS | Status: AC
  Administered 2023-09-22: 500 [IU] via INTRAVENOUS

## 2023-09-22 MED ORDER — SODIUM CHLORIDE 0.9% FLUSH
10.0000 mL | Freq: Once | INTRAVENOUS | Status: AC
  Administered 2023-09-22: 10 mL via INTRAVENOUS

## 2023-09-22 MED ORDER — SODIUM CHLORIDE 0.9 % IV SOLN
Freq: Once | INTRAVENOUS | Status: AC
Start: 2023-09-22 — End: 2023-09-22

## 2023-09-22 NOTE — Patient Instructions (Signed)
 [facility] [place] [facility]  Discharge Instructions: Thank you for choosing [facility] to provide your oncology and hematology care.  If you have a lab appointment with the Cancer Center - please note that after April 8th, 2024, all labs will be drawn in the cancer center.  You do not have to check in or register with the main entrance as you have in the past but will complete your check-in in the cancer center.  Wear comfortable clothing and clothing appropriate for easy access to any Portacath or PICC line.   We strive to give you quality time with your provider. You may need to reschedule your appointment if you arrive late (15 or more minutes).  Arriving late affects you and other patients whose appointments are after yours.  Also, if you miss three or more appointments without notifying the office, you may be dismissed from the clinic at the provider's discretion.      For prescription refill requests, have your pharmacy contact our office and allow 72 hours for refills to be completed.    Today you received the following chemotherapy and/or immunotherapy agents D4 Vidaza   To help prevent nausea and vomiting after your treatment, we encourage you to take your nausea medication as directed.  Azacitidine Injection What is this medication? AZACITIDINE (ay za SITE i deen) treats blood and bone marrow cancers. It works by slowing down the growth of cancer cells. This medicine may be used for other purposes; ask your health care provider or pharmacist if you have questions. COMMON BRAND NAME(S): Vidaza What should I tell my care team before I take this medication? They need to know if you have any of these conditions: Kidney disease Liver disease Low blood cell levels, such as low white cells, platelets, or red blood cells Low levels of albumin in the blood Low levels of bicarbonate in the blood An unusual or allergic reaction to  azacitidine, mannitol, other medications, foods, dyes, or preservatives If you or your partner are pregnant or trying to get pregnant Breast-feeding How should I use this medication? This medication is injected into a vein or under the skin. It is given by your care team in a hospital or clinic setting. Talk to your care team about the use of this medication in children. While it may be prescribed for children as young as 1 month for selected conditions, precautions do apply. Overdosage: If you think you have taken too much of this medicine contact a poison control center or emergency room at once. NOTE: This medicine is only for you. Do not share this medicine with others. What if I miss a dose? Keep appointments for follow-up doses. It is important not to miss your dose. Call your care team if you are unable to keep an appointment. What may interact with this medication? Interactions are not expected. This list may not describe all possible interactions. Give your health care provider a list of all the medicines, herbs, non-prescription drugs, or dietary supplements you use. Also tell them if you smoke, drink alcohol, or use illegal drugs. Some items may interact with your medicine. What should I watch for while using this medication? Your condition will be monitored carefully while you are receiving this medication. This medication may make you feel generally unwell. This is not uncommon as chemotherapy can affect healthy cells as well as cancer cells. Report any side effects. Continue your course of treatment  even though you feel ill unless your care team tells you to stop. You may need blood work done while you are taking this medication. Other product types may be available that contain the medication azacitidine. The injection and oral products should not be used in place of one another. Talk to your care team if you have questions. This medication can cause serious side effects. To reduce  the risk, your care team may give you other medications to take before receiving this one. Be sure to follow the directions from your care team. This medication may increase your risk of getting an infection. Call your care team for advice if you get a fever, chills, sore throat, or other symptoms of a cold or flu. Do not treat yourself. Try to avoid being around people who are sick. Avoid taking medications that contain aspirin, acetaminophen, ibuprofen, naproxen, or ketoprofen unless instructed by your care team. These medications may hide a fever. Be careful brushing or flossing your teeth or using a toothpick because you may get an infection or bleed more easily. If you have any dental work done, tell your dentist you are receiving this medication. Talk to your care team if you or your partner may be pregnant. Serious birth defects can occur if you take this medication during pregnancy and for 6 months after the last dose. You will need a negative pregnancy test before starting this medication. Contraception is recommended while taking this medication and for 6 months after the last dose. Your care team can help you find the option that works for you. If your partner can get pregnant, use a condom during sex while taking this medication and for 3 months after the last dose. Do not breastfeed while taking this medication and for 1 week after the last dose. This medication may cause infertility. Talk to your care team if you are concerned about your fertility. What side effects may I notice from receiving this medication? Side effects that you should report to your care team as soon as possible: Allergic reactions--skin rash, itching, hives, swelling of the face, lips, tongue, or throat Infection--fever, chills, cough, sore throat, wounds that don't heal, pain or trouble when passing urine, general feeling of discomfort or being unwell Kidney injury--decrease in the amount of urine, swelling of the  ankles, hands, or feet Liver injury--right upper belly pain, loss of appetite, nausea, light-colored stool, dark yellow or brown urine, yellowing skin or eyes, unusual weakness or fatigue Low red blood cell level--unusual weakness or fatigue, dizziness, headache, trouble breathing Tumor lysis syndrome (TLS)--nausea, vomiting, diarrhea, decrease in the amount of urine, dark urine, unusual weakness or fatigue, confusion, muscle pain or cramps, fast or irregular heartbeat, joint pain Unusual bruising or bleeding Side effects that usually do not require medical attention (report to your care team if they continue or are bothersome): Constipation Diarrhea Nausea Pain, redness, or irritation at injection site Vomiting This list may not describe all possible side effects. Call your doctor for medical advice about side effects. You may report side effects to FDA at 1-800-FDA-1088. Where should I keep my medication? This medication is given in a hospital or clinic. It will not be stored at home. NOTE: This sheet is a summary. It may not cover all possible information. If you have questions about this medicine, talk to your doctor, pharmacist, or health care provider.  2024 Elsevier/Gold Standard (2023-03-07 00:00:00)  BELOW ARE SYMPTOMS THAT SHOULD BE REPORTED IMMEDIATELY: *FEVER GREATER THAN 100.4 F (38 C)  OR HIGHER *CHILLS OR SWEATING *NAUSEA AND VOMITING THAT IS NOT CONTROLLED WITH YOUR NAUSEA MEDICATION *UNUSUAL SHORTNESS OF BREATH *UNUSUAL BRUISING OR BLEEDING *URINARY PROBLEMS (pain or burning when urinating, or frequent urination) *BOWEL PROBLEMS (unusual diarrhea, constipation, pain near the anus) TENDERNESS IN MOUTH AND THROAT WITH OR WITHOUT PRESENCE OF ULCERS (sore throat, sores in mouth, or a toothache) UNUSUAL RASH, SWELLING OR PAIN  UNUSUAL VAGINAL DISCHARGE OR ITCHING   Items with * indicate a potential emergency and should be followed up as soon as possible or go to the  Emergency Department if any problems should occur.  Please show the CHEMOTHERAPY ALERT CARD or IMMUNOTHERAPY ALERT CARD at check-in to the Emergency Department and triage nurse.  Should you have questions after your visit or need to cancel or reschedule your appointment, please contact [facility] [place] - A DEPT OF Eligha Bridegroom [facility] [phone]  and follow the prompts.  Office hours are 8:00 a.m. to 4:30 p.m. Monday - Friday. Please note that voicemails left after 4:00 p.m. may not be returned until the following business day.  We are closed weekends and major holidays. You have access to a nurse at all times for urgent questions. Please call the main number to the clinic [phone] and follow the prompts.  For any non-urgent questions, you may also contact your provider using MyChart. We now offer e-Visits for anyone 50 and older to request care online for non-urgent symptoms. For details visit [email or web address].   Also download the MyChart app! Go to the app store, search "MyChart", open the app, select [place], and log in with your MyChart username and password.

## 2023-09-22 NOTE — Progress Notes (Signed)
 Patient presents today for D4 Vidaza infusion.  Patient is in satisfactory condition with no new complaints voiced.  Vital signs are stable.  Labs reviewed (09/19/2023) and all labs are within treatment parameters.  We will proceed with treatment per MD orders.    Treatment given today per MD orders. Tolerated infusion without adverse affects. Vital signs stable. No complaints at this time. Discharged from clinic ambulatory in stable condition. Alert and oriented x 3. F/U with [facility] as scheduled.

## 2023-09-23 ENCOUNTER — Inpatient Hospital Stay: Payer: Medicare HMO

## 2023-09-23 VITALS — BP 106/66 | HR 73 | Temp 98.3°F | Resp 17

## 2023-09-23 DIAGNOSIS — D7581 Myelofibrosis: Secondary | ICD-10-CM | POA: Diagnosis not present

## 2023-09-23 DIAGNOSIS — C9310 Chronic myelomonocytic leukemia not having achieved remission: Secondary | ICD-10-CM | POA: Diagnosis not present

## 2023-09-23 DIAGNOSIS — Z5111 Encounter for antineoplastic chemotherapy: Secondary | ICD-10-CM | POA: Diagnosis not present

## 2023-09-23 DIAGNOSIS — K5900 Constipation, unspecified: Secondary | ICD-10-CM | POA: Diagnosis not present

## 2023-09-23 DIAGNOSIS — N946 Dysmenorrhea, unspecified: Secondary | ICD-10-CM | POA: Diagnosis not present

## 2023-09-23 DIAGNOSIS — E538 Deficiency of other specified B group vitamins: Secondary | ICD-10-CM | POA: Diagnosis not present

## 2023-09-23 DIAGNOSIS — D696 Thrombocytopenia, unspecified: Secondary | ICD-10-CM | POA: Diagnosis not present

## 2023-09-23 DIAGNOSIS — Z95828 Presence of other vascular implants and grafts: Secondary | ICD-10-CM

## 2023-09-23 DIAGNOSIS — R059 Cough, unspecified: Secondary | ICD-10-CM | POA: Diagnosis not present

## 2023-09-23 DIAGNOSIS — D539 Nutritional anemia, unspecified: Secondary | ICD-10-CM | POA: Diagnosis not present

## 2023-09-23 MED ORDER — SODIUM CHLORIDE 0.9 % IV SOLN
75.0000 mg/m2 | Freq: Once | INTRAVENOUS | Status: AC
  Administered 2023-09-23: 126 mg via INTRAVENOUS
  Filled 2023-09-23: qty 12.6

## 2023-09-23 MED ORDER — PALONOSETRON HCL INJECTION 0.25 MG/5ML
0.2500 mg | Freq: Once | INTRAVENOUS | Status: AC
  Administered 2023-09-23: 0.25 mg via INTRAVENOUS
  Filled 2023-09-23: qty 5

## 2023-09-23 MED ORDER — SODIUM CHLORIDE 0.9 % IV SOLN: Freq: Once | INTRAVENOUS | Status: AC

## 2023-09-23 MED ORDER — HEPARIN SOD (PORK) LOCK FLUSH 100 UNIT/ML IV SOLN
500.0000 [IU] | Freq: Once | INTRAVENOUS | Status: AC
  Administered 2023-09-23: 500 [IU] via INTRAVENOUS

## 2023-09-23 NOTE — Patient Instructions (Signed)
 [facility] [employer] - A DEPT OF [facility]  Discharge Instructions: Thank you for choosing [facility] to provide your oncology and hematology care.  If you have a lab appointment with the Cancer Center - please note that after April 8th, 2024, all labs will be drawn in the cancer center.  You do not have to check in or register with the main entrance as you have in the past but will complete your check-in in the cancer center.  Wear comfortable clothing and clothing appropriate for easy access to any Portacath or PICC line.   We strive to give you quality time with your provider. You may need to reschedule your appointment if you arrive late (15 or more minutes).  Arriving late affects you and other patients whose appointments are after yours.  Also, if you miss three or more appointments without notifying the office, you may be dismissed from the clinic at the provider's discretion.      For prescription refill requests, have your pharmacy contact our office and allow 72 hours for refills to be completed.    Today you received the following chemotherapy and/or immunotherapy agents vidaza Azacitidine Injection What is this medication? AZACITIDINE (ay za SITE i deen) treats blood and bone marrow cancers. It works by slowing down the growth of cancer cells. This medicine may be used for other purposes; ask your health care provider or pharmacist if you have questions. COMMON BRAND NAME(S): Vidaza What should I tell my care team before I take this medication? They need to know if you have any of these conditions: Kidney disease Liver disease Low blood cell levels, such as low white cells, platelets, or red blood cells Low levels of albumin in the blood Low levels of bicarbonate in the blood An unusual or allergic reaction to azacitidine, mannitol, other medications, foods, dyes, or preservatives If you or your partner are pregnant or trying to get  pregnant Breast-feeding How should I use this medication? This medication is injected into a vein or under the skin. It is given by your care team in a hospital or clinic setting. Talk to your care team about the use of this medication in children. While it may be prescribed for children as young as 1 month for selected conditions, precautions do apply. Overdosage: If you think you have taken too much of this medicine contact a poison control center or emergency room at once. NOTE: This medicine is only for you. Do not share this medicine with others. What if I miss a dose? Keep appointments for follow-up doses. It is important not to miss your dose. Call your care team if you are unable to keep an appointment. What may interact with this medication? Interactions are not expected. This list may not describe all possible interactions. Give your health care provider a list of all the medicines, herbs, non-prescription drugs, or dietary supplements you use. Also tell them if you smoke, drink alcohol, or use illegal drugs. Some items may interact with your medicine. What should I watch for while using this medication? Your condition will be monitored carefully while you are receiving this medication. This medication may make you feel generally unwell. This is not uncommon as chemotherapy can affect healthy cells as well as cancer cells. Report any side effects. Continue your course of treatment even though you feel ill unless your care team tells you to stop. You may need blood work done while you are taking  this medication. Other product types may be available that contain the medication azacitidine. The injection and oral products should not be used in place of one another. Talk to your care team if you have questions. This medication can cause serious side effects. To reduce the risk, your care team may give you other medications to take before receiving this one. Be sure to follow the directions from  your care team. This medication may increase your risk of getting an infection. Call your care team for advice if you get a fever, chills, sore throat, or other symptoms of a cold or flu. Do not treat yourself. Try to avoid being around people who are sick. Avoid taking medications that contain aspirin, acetaminophen, ibuprofen, naproxen, or ketoprofen unless instructed by your care team. These medications may hide a fever. Be careful brushing or flossing your teeth or using a toothpick because you may get an infection or bleed more easily. If you have any dental work done, tell your dentist you are receiving this medication. Talk to your care team if you or your partner may be pregnant. Serious birth defects can occur if you take this medication during pregnancy and for 6 months after the last dose. You will need a negative pregnancy test before starting this medication. Contraception is recommended while taking this medication and for 6 months after the last dose. Your care team can help you find the option that works for you. If your partner can get pregnant, use a condom during sex while taking this medication and for 3 months after the last dose. Do not breastfeed while taking this medication and for 1 week after the last dose. This medication may cause infertility. Talk to your care team if you are concerned about your fertility. What side effects may I notice from receiving this medication? Side effects that you should report to your care team as soon as possible: Allergic reactions--skin rash, itching, hives, swelling of the face, lips, tongue, or throat Infection--fever, chills, cough, sore throat, wounds that don't heal, pain or trouble when passing urine, general feeling of discomfort or being unwell Kidney injury--decrease in the amount of urine, swelling of the ankles, hands, or feet Liver injury--right upper belly pain, loss of appetite, nausea, light-colored stool, dark yellow or brown  urine, yellowing skin or eyes, unusual weakness or fatigue Low red blood cell level--unusual weakness or fatigue, dizziness, headache, trouble breathing Tumor lysis syndrome (TLS)--nausea, vomiting, diarrhea, decrease in the amount of urine, dark urine, unusual weakness or fatigue, confusion, muscle pain or cramps, fast or irregular heartbeat, joint pain Unusual bruising or bleeding Side effects that usually do not require medical attention (report to your care team if they continue or are bothersome): Constipation Diarrhea Nausea Pain, redness, or irritation at injection site Vomiting This list may not describe all possible side effects. Call your doctor for medical advice about side effects. You may report side effects to FDA at 1-800-FDA-1088. Where should I keep my medication? This medication is given in a hospital or clinic. It will not be stored at home. NOTE: This sheet is a summary. It may not cover all possible information. If you have questions about this medicine, talk to your doctor, pharmacist, or health care provider.  2024 Elsevier/Gold Standard (2023-03-07 00:00:00)      To help prevent nausea and vomiting after your treatment, we encourage you to take your nausea medication as directed.  BELOW ARE SYMPTOMS THAT SHOULD BE REPORTED IMMEDIATELY: *FEVER GREATER THAN 100.4 F (38  C) OR HIGHER *CHILLS OR SWEATING *NAUSEA AND VOMITING THAT IS NOT CONTROLLED WITH YOUR NAUSEA MEDICATION *UNUSUAL SHORTNESS OF BREATH *UNUSUAL BRUISING OR BLEEDING *URINARY PROBLEMS (pain or burning when urinating, or frequent urination) *BOWEL PROBLEMS (unusual diarrhea, constipation, pain near the anus) TENDERNESS IN MOUTH AND THROAT WITH OR WITHOUT PRESENCE OF ULCERS (sore throat, sores in mouth, or a toothache) UNUSUAL RASH, SWELLING OR PAIN  UNUSUAL VAGINAL DISCHARGE OR ITCHING   Items with * indicate a potential emergency and should be followed up as soon as possible or go to the Emergency  Department if any problems should occur.  Please show the CHEMOTHERAPY ALERT CARD or IMMUNOTHERAPY ALERT CARD at check-in to the Emergency Department and triage nurse.  Should you have questions after your visit or need to cancel or reschedule your appointment, please contact [facility] [place] - A DEPT OF Eligha Bridegroom [facility] [phone]  and follow the prompts.  Office hours are 8:00 a.m. to 4:30 p.m. Monday - Friday. Please note that voicemails left after 4:00 p.m. may not be returned until the following business day.  We are closed weekends and major holidays. You have access to a nurse at all times for urgent questions. Please call the main number to the clinic [phone] and follow the prompts.  For any non-urgent questions, you may also contact your provider using MyChart. We now offer e-Visits for anyone 61 and older to request care online for non-urgent symptoms. For details visit [email or web address].   Also download the MyChart app! Go to the app store, search "MyChart", open the app, select [place], and log in with your MyChart username and password.

## 2023-09-23 NOTE — Progress Notes (Signed)
 No complaints today from patient. Proceed with treatment as planned.

## 2023-09-23 NOTE — Progress Notes (Signed)
Vidaza given today per MD orders. Tolerated infusion without adverse affects. Vital signs stable. No complaints at this time. Discharged from clinic ambulatory in stable condition. Alert and oriented x 3. F/U with [facility] as scheduled.   

## 2023-09-26 ENCOUNTER — Other Ambulatory Visit (HOSPITAL_COMMUNITY): Payer: Self-pay

## 2023-09-26 ENCOUNTER — Inpatient Hospital Stay: Payer: Medicare HMO

## 2023-09-26 ENCOUNTER — Ambulatory Visit (HOSPITAL_COMMUNITY)
Admission: RE | Admit: 2023-09-26 | Discharge: 2023-09-26 | Disposition: A | Discharge status: Home / Self Care | Payer: Medicare HMO | Source: Ambulatory Visit | Attending: Internal Medicine | Admitting: Internal Medicine

## 2023-09-26 ENCOUNTER — Encounter (HOSPITAL_COMMUNITY): Payer: Self-pay

## 2023-09-26 ENCOUNTER — Other Ambulatory Visit: Payer: Self-pay

## 2023-09-26 VITALS — BP 116/69 | HR 76 | Temp 96.7°F | Resp 19

## 2023-09-26 DIAGNOSIS — Z1231 Encounter for screening mammogram for malignant neoplasm of breast: Secondary | ICD-10-CM | POA: Insufficient documentation

## 2023-09-26 DIAGNOSIS — C9310 Chronic myelomonocytic leukemia not having achieved remission: Secondary | ICD-10-CM

## 2023-09-26 DIAGNOSIS — D696 Thrombocytopenia, unspecified: Secondary | ICD-10-CM | POA: Diagnosis not present

## 2023-09-26 DIAGNOSIS — N946 Dysmenorrhea, unspecified: Secondary | ICD-10-CM | POA: Diagnosis not present

## 2023-09-26 DIAGNOSIS — D7581 Myelofibrosis: Secondary | ICD-10-CM | POA: Diagnosis not present

## 2023-09-26 DIAGNOSIS — E538 Deficiency of other specified B group vitamins: Secondary | ICD-10-CM | POA: Diagnosis not present

## 2023-09-26 DIAGNOSIS — Z95828 Presence of other vascular implants and grafts: Secondary | ICD-10-CM

## 2023-09-26 DIAGNOSIS — D539 Nutritional anemia, unspecified: Secondary | ICD-10-CM | POA: Diagnosis not present

## 2023-09-26 DIAGNOSIS — Z5111 Encounter for antineoplastic chemotherapy: Secondary | ICD-10-CM | POA: Diagnosis not present

## 2023-09-26 DIAGNOSIS — K5900 Constipation, unspecified: Secondary | ICD-10-CM | POA: Diagnosis not present

## 2023-09-26 DIAGNOSIS — R059 Cough, unspecified: Secondary | ICD-10-CM | POA: Diagnosis not present

## 2023-09-26 LAB — COMPREHENSIVE METABOLIC PANEL
ALT: 15 U/L (ref 0–44)
AST: 22 U/L (ref 15–41)
Albumin: 4.1 g/dL (ref 3.5–5.0)
Alkaline Phosphatase: 54 U/L (ref 38–126)
Anion gap: 10 (ref 5–15)
BUN: 13 mg/dL (ref 8–23)
CO2: 27 mmol/L (ref 22–32)
Calcium: 9.3 mg/dL (ref 8.9–10.3)
Chloride: 100 mmol/L (ref 98–111)
Creatinine, Ser: 0.85 mg/dL (ref 0.44–1.00)
GFR, Estimated: >60 mL/min (ref >60)
Glucose, Bld: 92 mg/dL (ref 70–99)
Potassium: 4.3 mmol/L (ref 3.5–5.1)
Sodium: 137 mmol/L (ref 135–145)
Total Bilirubin: 1.2 mg/dL (ref 0.0–1.2)
Total Protein: 7 g/dL (ref 6.5–8.1)

## 2023-09-26 LAB — CBC WITH DIFFERENTIAL/PLATELET
Abs Immature Granulocytes: 0.1 10*3/uL — ABNORMAL HIGH (ref 0.00–0.07)
Band Neutrophils: 1 %
Basophils Absolute: 0 10*3/uL (ref 0.0–0.1)
Basophils Relative: 0 %
Eosinophils Absolute: 0.1 10*3/uL (ref 0.0–0.5)
Eosinophils Relative: 4 %
HCT: 26.9 % — ABNORMAL LOW (ref 36.0–46.0)
Hemoglobin: 8.4 g/dL — ABNORMAL LOW (ref 12.0–15.0)
Lymphocytes Relative: 28 %
Lymphs Abs: 0.8 10*3/uL (ref 0.7–4.0)
MCH: 30.5 pg (ref 26.0–34.0)
MCHC: 31.2 g/dL (ref 30.0–36.0)
MCV: 97.8 fL (ref 80.0–100.0)
Metamyelocytes Relative: 2 %
Monocytes Absolute: 0 10*3/uL — ABNORMAL LOW (ref 0.1–1.0)
Monocytes Relative: 1 %
Myelocytes: 1 %
Neutro Abs: 1.8 10*3/uL (ref 1.7–7.7)
Neutrophils Relative %: 63 %
Platelets: 99 10*3/uL — ABNORMAL LOW (ref 150–400)
RBC: 2.75 MIL/uL — ABNORMAL LOW (ref 3.87–5.11)
RDW: 20.2 % — ABNORMAL HIGH (ref 11.5–15.5)
WBC: 2.8 10*3/uL — ABNORMAL LOW (ref 4.0–10.5)
nRBC: 2 /100{WBCs} — ABNORMAL HIGH
nRBC: 2.5 % — ABNORMAL HIGH (ref 0.0–0.2)

## 2023-09-26 LAB — MAGNESIUM: Magnesium: 2.2 mg/dL (ref 1.7–2.4)

## 2023-09-26 MED ORDER — SODIUM CHLORIDE 0.9 % IV SOLN: Freq: Once | INTRAVENOUS | Status: AC

## 2023-09-26 MED ORDER — SODIUM CHLORIDE 0.9% FLUSH
10.0000 mL | INTRAVENOUS | Status: AC
  Administered 2023-09-26: 10 mL

## 2023-09-26 MED ORDER — PALONOSETRON HCL INJECTION 0.25 MG/5ML
0.2500 mg | Freq: Once | INTRAVENOUS | Status: AC
  Administered 2023-09-26: 0.25 mg via INTRAVENOUS
  Filled 2023-09-26: qty 5

## 2023-09-26 MED ORDER — HEPARIN SOD (PORK) LOCK FLUSH 100 UNIT/ML IV SOLN
500.0000 [IU] | Freq: Once | INTRAVENOUS | Status: AC
  Administered 2023-09-26: 500 [IU] via INTRAVENOUS

## 2023-09-26 MED ORDER — SODIUM CHLORIDE 0.9 % IV SOLN
75.0000 mg/m2 | Freq: Once | INTRAVENOUS | Status: AC
  Administered 2023-09-26: 126 mg via INTRAVENOUS
  Filled 2023-09-26: qty 12.6

## 2023-09-26 MED ORDER — SODIUM CHLORIDE 0.9% FLUSH
10.0000 mL | Freq: Once | INTRAVENOUS | Status: AC
  Administered 2023-09-26: 10 mL via INTRAVENOUS

## 2023-09-26 NOTE — Patient Instructions (Signed)
 [facility] [place] - [facility]  Discharge Instructions: Thank you for choosing [facility] to provide your oncology and hematology care.  If you have a lab appointment with the Cancer Center - please note that after April 8th, 2024, all labs will be drawn in the cancer center.  You do not have to check in or register with the main entrance as you have in the past but will complete your check-in in the cancer center.  Wear comfortable clothing and clothing appropriate for easy access to any Portacath or PICC line.   We strive to give you quality time with your provider. You may need to reschedule your appointment if you arrive late (15 or more minutes).  Arriving late affects you and other patients whose appointments are after yours.  Also, if you miss three or more appointments without notifying the office, you may be dismissed from the clinic at the provider's discretion.      For prescription refill requests, have your pharmacy contact our office and allow 72 hours for refills to be completed.    Today you received the following chemotherapy and/or immunotherapy agents Vidaza.  Azacitidine Injection What is this medication? AZACITIDINE (ay za SITE i deen) treats blood and bone marrow cancers. It works by slowing down the growth of cancer cells. This medicine may be used for other purposes; ask your health care provider or pharmacist if you have questions. COMMON BRAND NAME(S): Vidaza What should I tell my care team before I take this medication? They need to know if you have any of these conditions: Kidney disease Liver disease Low blood cell levels, such as low white cells, platelets, or red blood cells Low levels of albumin in the blood Low levels of bicarbonate in the blood An unusual or allergic reaction to azacitidine, mannitol, other medications, foods, dyes, or preservatives If you or your partner are pregnant or trying to get  pregnant Breast-feeding How should I use this medication? This medication is injected into a vein or under the skin. It is given by your care team in a hospital or clinic setting. Talk to your care team about the use of this medication in children. While it may be prescribed for children as young as 1 month for selected conditions, precautions do apply. Overdosage: If you think you have taken too much of this medicine contact a poison control center or emergency room at once. NOTE: This medicine is only for you. Do not share this medicine with others. What if I miss a dose? Keep appointments for follow-up doses. It is important not to miss your dose. Call your care team if you are unable to keep an appointment. What may interact with this medication? Interactions are not expected. This list may not describe all possible interactions. Give your health care provider a list of all the medicines, herbs, non-prescription drugs, or dietary supplements you use. Also tell them if you smoke, drink alcohol, or use illegal drugs. Some items may interact with your medicine. What should I watch for while using this medication? Your condition will be monitored carefully while you are receiving this medication. This medication may make you feel generally unwell. This is not uncommon as chemotherapy can affect healthy cells as well as cancer cells. Report any side effects. Continue your course of treatment even though you feel ill unless your care team tells you to stop. You may need blood work done while you are  taking this medication. Other product types may be available that contain the medication azacitidine. The injection and oral products should not be used in place of one another. Talk to your care team if you have questions. This medication can cause serious side effects. To reduce the risk, your care team may give you other medications to take before receiving this one. Be sure to follow the directions from  your care team. This medication may increase your risk of getting an infection. Call your care team for advice if you get a fever, chills, sore throat, or other symptoms of a cold or flu. Do not treat yourself. Try to avoid being around people who are sick. Avoid taking medications that contain aspirin, acetaminophen, ibuprofen, naproxen, or ketoprofen unless instructed by your care team. These medications may hide a fever. Be careful brushing or flossing your teeth or using a toothpick because you may get an infection or bleed more easily. If you have any dental work done, tell your dentist you are receiving this medication. Talk to your care team if you or your partner may be pregnant. Serious birth defects can occur if you take this medication during pregnancy and for 6 months after the last dose. You will need a negative pregnancy test before starting this medication. Contraception is recommended while taking this medication and for 6 months after the last dose. Your care team can help you find the option that works for you. If your partner can get pregnant, use a condom during sex while taking this medication and for 3 months after the last dose. Do not breastfeed while taking this medication and for 1 week after the last dose. This medication may cause infertility. Talk to your care team if you are concerned about your fertility. What side effects may I notice from receiving this medication? Side effects that you should report to your care team as soon as possible: Allergic reactions--skin rash, itching, hives, swelling of the face, lips, tongue, or throat Infection--fever, chills, cough, sore throat, wounds that don't heal, pain or trouble when passing urine, general feeling of discomfort or being unwell Kidney injury--decrease in the amount of urine, swelling of the ankles, hands, or feet Liver injury--right upper belly pain, loss of appetite, nausea, light-colored stool, dark yellow or Iyla Balzarini  urine, yellowing skin or eyes, unusual weakness or fatigue Low red blood cell level--unusual weakness or fatigue, dizziness, headache, trouble breathing Tumor lysis syndrome (TLS)--nausea, vomiting, diarrhea, decrease in the amount of urine, dark urine, unusual weakness or fatigue, confusion, muscle pain or cramps, fast or irregular heartbeat, joint pain Unusual bruising or bleeding Side effects that usually do not require medical attention (report to your care team if they continue or are bothersome): Constipation Diarrhea Nausea Pain, redness, or irritation at injection site Vomiting This list may not describe all possible side effects. Call your doctor for medical advice about side effects. You may report side effects to FDA at 1-800-FDA-1088. Where should I keep my medication? This medication is given in a hospital or clinic. It will not be stored at home. NOTE: This sheet is a summary. It may not cover all possible information. If you have questions about this medicine, talk to your doctor, pharmacist, or health care provider.  2024 Elsevier/Gold Standard (2023-03-07 00:00:00)       To help prevent nausea and vomiting after your treatment, we encourage you to take your nausea medication as directed.  BELOW ARE SYMPTOMS THAT SHOULD BE REPORTED IMMEDIATELY: *FEVER GREATER THAN 100.4  F (38 C) OR HIGHER *CHILLS OR SWEATING *NAUSEA AND VOMITING THAT IS NOT CONTROLLED WITH YOUR NAUSEA MEDICATION *UNUSUAL SHORTNESS OF BREATH *UNUSUAL BRUISING OR BLEEDING *URINARY PROBLEMS (pain or burning when urinating, or frequent urination) *BOWEL PROBLEMS (unusual diarrhea, constipation, pain near the anus) TENDERNESS IN MOUTH AND THROAT WITH OR WITHOUT PRESENCE OF ULCERS (sore throat, sores in mouth, or a toothache) UNUSUAL RASH, SWELLING OR PAIN  UNUSUAL VAGINAL DISCHARGE OR ITCHING   Items with * indicate a potential emergency and should be followed up as soon as possible or go to the Emergency  Department if any problems should occur.  Please show the CHEMOTHERAPY ALERT CARD or IMMUNOTHERAPY ALERT CARD at check-in to the Emergency Department and triage nurse.  Should you have questions after your visit or need to cancel or reschedule your appointment, please contact [facility] CANCER CTR [place] - A DEPT OF Eligha Bridegroom [facility] [phone]  and follow the prompts.  Office hours are 8:00 a.m. to 4:30 p.m. Monday - Friday. Please note that voicemails left after 4:00 p.m. may not be returned until the following business day.  We are closed weekends and major holidays. You have access to a nurse at all times for urgent questions. Please call the main number to the clinic [phone] and follow the prompts.  For any non-urgent questions, you may also contact your provider using MyChart. We now offer e-Visits for anyone 88 and older to request care online for non-urgent symptoms. For details visit [email or web address].   Also download the MyChart app! Go to the app store, search "MyChart", open the app, select [place], and log in with your MyChart username and password.

## 2023-09-26 NOTE — Progress Notes (Signed)
Patient presents today for chemotherapy infusion.  Patient is in satisfactory condition with no new complaints voiced.  Vital signs are stable.  We will proceed with treatment per MD orders.  Patient tolerated treatment well with no complaints voiced.  Patient left ambulatory in stable condition.  Vital signs stable at discharge.  Follow up as scheduled.    

## 2023-09-26 NOTE — Progress Notes (Signed)
 Specialty Pharmacy Initial Fill Coordination Note  [name] is a 73 y.o. female contacted today regarding initial fill of specialty medication(s) [name] [name])   Patient requested Courier to Provider Office   Delivery date: 09/28/23   Verified address: [facility]- [address] [place], [place], [place]   Medication will be filled on 09/27/23.   Patient is aware of $12.15 copayment.  CC was put on file.

## 2023-09-26 NOTE — Progress Notes (Signed)
 Specialty Pharmacy Initiation Note   [name] is a 73 y.o. female who will be followed by the specialty pharmacy service for RxSp Asthma/COPD    Review of administration, indication, effectiveness, safety, potential side effects, storage/disposable, and missed dose instructions occurred today for patient's specialty medication(s) [name] [name])     Patient/Caregiver did not have any additional questions or concerns.   Patient's therapy is appropriate to: Continue    Goals Addressed             This Visit's Progress    Minimize recurrence of flares       Patient is on track. Patient will maintain adherence.  Patient is new to pharmacy, but existing to medication and reports that she is well-controlled on therapy.          Servando Snare Specialty Pharmacist

## 2023-09-27 ENCOUNTER — Other Ambulatory Visit: Payer: Self-pay

## 2023-09-27 ENCOUNTER — Inpatient Hospital Stay: Payer: Medicare HMO

## 2023-09-27 VITALS — BP 101/76 | HR 68 | Temp 96.9°F | Resp 18

## 2023-09-27 DIAGNOSIS — Z95828 Presence of other vascular implants and grafts: Secondary | ICD-10-CM

## 2023-09-27 DIAGNOSIS — E538 Deficiency of other specified B group vitamins: Secondary | ICD-10-CM | POA: Diagnosis not present

## 2023-09-27 DIAGNOSIS — C9310 Chronic myelomonocytic leukemia not having achieved remission: Secondary | ICD-10-CM | POA: Diagnosis not present

## 2023-09-27 DIAGNOSIS — D539 Nutritional anemia, unspecified: Secondary | ICD-10-CM | POA: Diagnosis not present

## 2023-09-27 DIAGNOSIS — D7581 Myelofibrosis: Secondary | ICD-10-CM | POA: Diagnosis not present

## 2023-09-27 DIAGNOSIS — R059 Cough, unspecified: Secondary | ICD-10-CM | POA: Diagnosis not present

## 2023-09-27 DIAGNOSIS — D696 Thrombocytopenia, unspecified: Secondary | ICD-10-CM | POA: Diagnosis not present

## 2023-09-27 DIAGNOSIS — K5900 Constipation, unspecified: Secondary | ICD-10-CM | POA: Diagnosis not present

## 2023-09-27 DIAGNOSIS — Z5111 Encounter for antineoplastic chemotherapy: Secondary | ICD-10-CM | POA: Diagnosis not present

## 2023-09-27 DIAGNOSIS — N946 Dysmenorrhea, unspecified: Secondary | ICD-10-CM | POA: Diagnosis not present

## 2023-09-27 MED ORDER — SODIUM CHLORIDE 0.9 % IV SOLN: Freq: Once | INTRAVENOUS | Status: AC

## 2023-09-27 MED ORDER — HEPARIN SOD (PORK) LOCK FLUSH 100 UNIT/ML IV SOLN
500.0000 [IU] | Freq: Once | INTRAVENOUS | Status: AC
  Administered 2023-09-27: 500 [IU] via INTRAVENOUS

## 2023-09-27 MED ORDER — AZACITIDINE CHEMO INJECTION 100 MG
75.0000 mg/m2 | Freq: Once | INTRAMUSCULAR | Status: AC
  Administered 2023-09-27: 126 mg via INTRAVENOUS
  Filled 2023-09-27: qty 12.6

## 2023-09-27 NOTE — Telephone Encounter (Signed)
 L/m for patient tezspire delivery to clinic 3/12 and can all and make appt for inj

## 2023-09-27 NOTE — Patient Instructions (Signed)
 [facility] [place] [facility]  Discharge Instructions: Thank you for choosing [facility] to provide your oncology and hematology care.  If you have a lab appointment with the Cancer Center - please note that after April 8th, 2024, all labs will be drawn in the cancer center.  You do not have to check in or register with the main entrance as you have in the past but will complete your check-in in the cancer center.  Wear comfortable clothing and clothing appropriate for easy access to any Portacath or PICC line.   We strive to give you quality time with your provider. You may need to reschedule your appointment if you arrive late (15 or more minutes).  Arriving late affects you and other patients whose appointments are after yours.  Also, if you miss three or more appointments without notifying the office, you may be dismissed from the clinic at the provider's discretion.      For prescription refill requests, have your pharmacy contact our office and allow 72 hours for refills to be completed.    Today you received the following chemotherapy and/or immunotherapy agents Vidaza      To help prevent nausea and vomiting after your treatment, we encourage you to take your nausea medication as directed.  BELOW ARE SYMPTOMS THAT SHOULD BE REPORTED IMMEDIATELY: *FEVER GREATER THAN 100.4 F (38 C) OR HIGHER *CHILLS OR SWEATING *NAUSEA AND VOMITING THAT IS NOT CONTROLLED WITH YOUR NAUSEA MEDICATION *UNUSUAL SHORTNESS OF BREATH *UNUSUAL BRUISING OR BLEEDING *URINARY PROBLEMS (pain or burning when urinating, or frequent urination) *BOWEL PROBLEMS (unusual diarrhea, constipation, pain near the anus) TENDERNESS IN MOUTH AND THROAT WITH OR WITHOUT PRESENCE OF ULCERS (sore throat, sores in mouth, or a toothache) UNUSUAL RASH, SWELLING OR PAIN  UNUSUAL VAGINAL DISCHARGE OR ITCHING   Items with * indicate a potential emergency and should be followed up as  soon as possible or go to the Emergency Department if any problems should occur.  Please show the CHEMOTHERAPY ALERT CARD or IMMUNOTHERAPY ALERT CARD at check-in to the Emergency Department and triage nurse.  Should you have questions after your visit or need to cancel or reschedule your appointment, please contact [facility] [place] - A DEPT OF Eligha Bridegroom [facility] [phone]  and follow the prompts.  Office hours are 8:00 a.m. to 4:30 p.m. Monday - Friday. Please note that voicemails left after 4:00 p.m. may not be returned until the following business day.  We are closed weekends and major holidays. You have access to a nurse at all times for urgent questions. Please call the main number to the clinic [phone] and follow the prompts.  For any non-urgent questions, you may also contact your provider using MyChart. We now offer e-Visits for anyone 72 and older to request care online for non-urgent symptoms. For details visit [email or web address].   Also download the MyChart app! Go to the app store, search "MyChart", open the app, select [place], and log in with your MyChart username and password.

## 2023-09-27 NOTE — Progress Notes (Signed)
 Patient tolerated chemotherapy with no complaints voiced.  Side effects with management reviewed with understanding verbalized.  Port site clean and dry with no bruising or swelling noted at site.  Good blood return noted before and after administration of chemotherapy.  Band aid applied.  Patient left in satisfactory condition with VSS and no s/s of distress noted. All follow ups as scheduled.   [name] [employer]

## 2023-09-28 ENCOUNTER — Other Ambulatory Visit: Payer: Self-pay

## 2023-09-30 ENCOUNTER — Ambulatory Visit (INDEPENDENT_AMBULATORY_CARE_PROVIDER_SITE_OTHER)

## 2023-09-30 DIAGNOSIS — J4550 Severe persistent asthma, uncomplicated: Secondary | ICD-10-CM | POA: Diagnosis not present

## 2023-10-02 ENCOUNTER — Other Ambulatory Visit: Payer: Self-pay

## 2023-10-07 ENCOUNTER — Ambulatory Visit (INDEPENDENT_AMBULATORY_CARE_PROVIDER_SITE_OTHER): Payer: Medicare HMO | Admitting: Allergy

## 2023-10-07 ENCOUNTER — Encounter: Payer: Self-pay | Admitting: Allergy

## 2023-10-07 VITALS — BP 100/62 | HR 65 | Temp 98.3°F

## 2023-10-07 DIAGNOSIS — J3089 Other allergic rhinitis: Secondary | ICD-10-CM | POA: Diagnosis not present

## 2023-10-07 DIAGNOSIS — J300 Vasomotor rhinitis: Secondary | ICD-10-CM

## 2023-10-07 DIAGNOSIS — J4540 Moderate persistent asthma, uncomplicated: Secondary | ICD-10-CM | POA: Diagnosis not present

## 2023-10-07 DIAGNOSIS — K219 Gastro-esophageal reflux disease without esophagitis: Secondary | ICD-10-CM

## 2023-10-07 DIAGNOSIS — R053 Chronic cough: Secondary | ICD-10-CM

## 2023-10-07 MED ORDER — FLUTICASONE PROPIONATE 50 MCG/ACT NA SUSP: 2.0000 | Freq: Two times a day (BID) | NASAL | 5 refills | Status: AC

## 2023-10-07 MED ORDER — BREZTRI AEROSPHERE 160-9-4.8 MCG/ACT IN AERO: 2.0000 | INHALATION_SPRAY | Freq: Two times a day (BID) | RESPIRATORY_TRACT | 5 refills | Status: DC

## 2023-10-07 MED ORDER — MONTELUKAST SODIUM 10 MG PO TABS: 10.0000 mg | ORAL_TABLET | Freq: Every day | ORAL | 5 refills | Status: DC

## 2023-10-07 MED ORDER — ALBUTEROL SULFATE HFA 108 (90 BASE) MCG/ACT IN AERS: INHALATION_SPRAY | RESPIRATORY_TRACT | 1 refills | Status: AC

## 2023-10-07 MED ORDER — FAMOTIDINE 20 MG PO TABS: 20.0000 mg | ORAL_TABLET | Freq: Two times a day (BID) | ORAL | 5 refills | Status: DC

## 2023-10-07 NOTE — Progress Notes (Signed)
 Follow-up Note  RE: [name] MRN: [id number] DOB: 07/26/1950 Date of Office Visit: 10/07/2023   History of present illness: [name] is a 73 y.o. female presenting today for follow-up of asthma, allergic rhinitis with vasomotor rhinitis, GERD, chronic cough.  She was last seen in the office on 11/05/22 by myself.   Discussed the use of AI scribe software for clinical note transcription with the patient, who gave verbal consent to proceed.  She has a history of asthma with symptoms exacerbated during allergy season and weather changes. She uses Delsym occasionally and carries an albuterol emergency inhaler, which she uses sparingly. She uses a Breztri inhaler twice daily, in the morning and at night.  She states her breathing has been doing good over the past year.  She has not had UC/ED visits or systemic steroid needs for breathing issues since last year.   She experiences increased mucus production, attributed to sinus issues. She uses Atrovent nasal spray as needed but not regularly. She has been experiencing sinus drainage.  She experiences occasional reflux symptoms, which are well-controlled with pantoprazole and famotidine. She has reduced her caffeine intake, which she believes has helped with her reflux symptoms. Her taste buds have been affected by chemotherapy, influencing her caffeine consumption.  She has been undergoing monthly treatments for leukemia since last year, with treatments proceeding well.  She continues to use gabapentin daily but is no longer following with Dr Suszanne Conners.     Review of systems: 10pt ROS negative unless noted above in HPI   Past medical/social/surgical/family history have been reviewed and are unchanged unless specifically indicated below.  No changes  Medication List: Current Outpatient Medications  Medication Sig Dispense Refill   albuterol (VENTOLIN HFA) 108 (90 Base) MCG/ACT inhaler INHALE 2 PUFFS BY MOUTH EVERY 6 HOURS AS NEEDED FOR  SHORTNESS OF BREATH OR WHEEZING. 18 g 2   albuterol (VENTOLIN HFA) 108 (90 Base) MCG/ACT inhaler INHALE TWO PUFFS EVERY 4 HOURS AS NEEDED FOR WHEEZING OR FOR SHORTNESS OF BREATH 18 g 1   ARNUITY ELLIPTA 200 MCG/ACT AEPB Inhale 1 puff into the lungs daily. 30 each 5   azaCITIDine 5 mg/2 mLs in lactated ringers infusion Inject into the vein daily. Days 1-7 every 28 days     azelastine (ASTELIN) 0.1 % nasal spray SPRAY 1 TO 2 SPRAYS PER NOSTRIL TWICE DAILY. 30 mL 3   benzonatate (TESSALON) 200 MG capsule Take 200 mg by mouth 3 (three) times daily as needed.     BREZTRI AEROSPHERE 160-9-4.8 MCG/ACT AERO Inhale 2 puffs into the lungs 2 (two) times daily. 10.7 g 5   cetirizine (ZYRTEC) 5 MG tablet Take 1 tablet (5 mg total) by mouth daily. 30 tablet 0   ciclopirox (PENLAC) 8 % solution APPLY TOPICALLY AT BEDTIME. APPLY OVER NAIL FOLD AND SURROUNDING SKIN. APPLY DAILY OVER PREVIOUS COAT. AFTER SEVEN DAYS, MAY REMOVE WITH ALCOHOL AND CONTINUE CYCLE. 6.6 mL 2   EPINEPHrine 0.3 mg/0.3 mL IJ SOAJ injection Inject 0.3 mg into the muscle as needed for anaphylaxis. 2 each 1   famotidine (PEPCID) 20 MG tablet Take 1 tablet (20 mg total) by mouth 2 (two) times daily. 60 tablet 5   FLUoxetine (PROZAC) 20 MG capsule Take 1 capsule by mouth daily.     fluticasone (FLONASE) 50 MCG/ACT nasal spray Place 2 sprays into both nostrils 2 (two) times daily. 16 g 5   fluticasone (FLOVENT HFA) 110 MCG/ACT inhaler Inhale two puffs twice daily  to prevent cough or wheeze. Rinse mouth after use. 12 g 5   folic acid (FOLVITE) 1 MG tablet TAKE ONE TABLET BY MOUTH ONCE DAILY 30 tablet 5   gabapentin (NEURONTIN) 300 MG capsule      HYDROcodone bit-homatropine (HYCODAN) 5-1.5 MG/5ML syrup      ipratropium (ATROVENT) 0.06 % nasal spray USE 2 SPRAYS IN EACH NOSTRIL THREE TIMES A DAY AS NEEDED. 15 mL 5   lactulose (CHRONULAC) 10 GM/15ML solution Take 15 mLs (10 g total) by mouth every 3 (three) hours as needed for mild constipation. 450  mL 2   lidocaine-prilocaine (EMLA) cream Apply a quarter sized amount to port a cath site and cover with plastic wrap one hour prior to infusion appointments 30 g 3   megestrol (MEGACE) 400 MG/10ML suspension Take 10 mLs (400 mg total) by mouth 2 (two) times daily. 480 mL 2   montelukast (SINGULAIR) 10 MG tablet TAKE ONE TABLET BY MOUTH AT BEDTIME 30 tablet 3   NON FORMULARY [facility]  Antifungal (nail)-#1     pantoprazole (PROTONIX) 40 MG tablet Take 1 tablet (40 mg total) by mouth daily. 30 tablet 5   prochlorperazine (COMPAZINE) 10 MG tablet Take 1 tablet (10 mg total) by mouth every 6 (six) hours as needed for nausea or vomiting. 30 tablet 0   Respiratory Therapy Supplies (FLUTTER) DEVI Use as directed 1 each 3   SF 5000 PLUS 1.1 % CREA dental cream      Sorbitol 70 % SOLN Take by mouth.     Spacer/Aero-Holding Chambers (AEROCHAMBER PLUS WITH MASK) inhaler 1 each by Other route See admin instructions. Use with inhaler as instructed. 1 each 1   Tezepelumab-ekko (TEZSPIRE) 210 MG/1. SOAJ Inject 210 mg into the skin every 28 (twenty-eight) days. 1.91 mL 11   tiZANidine (ZANAFLEX) 2 MG tablet Take 1 tablet (2 mg total) by mouth 2 (two) times daily as needed for muscle spasms. 15 tablet 0   traZODone (DESYREL) 50 MG tablet TAKE ONE TABLET BY MOUTH AT BEDTIME 30 tablet 3   Current Facility-Administered Medications  Medication Dose Route Frequency Provider Last Rate Last Admin   tezepelumab-ekko (TEZSPIRE) 210 MG/1. syringe 210 mg  210 mg Subcutaneous Q28 days Marcelyn Bruins, MD   210 mg at 09/30/23 4098     Known medication allergies: Allergies  Allergen Reactions   Levonorgestrel-Ethinyl Estrad Cough   Sulfa Antibiotics Other (See Comments)    Unknown- pt unsure of reaction; believes it may be nausea   Sulfamethoxazole-Trimethoprim Other (See Comments)   Cephalosporins Other (See Comments)    Other Reaction: Toxicity     Physical examination: Blood  pressure 100/62, pulse 65, temperature 98.3 F (36.8 C), SpO2 96%.  General: Alert, interactive, in no acute distress. HEENT: PERRLA, TMs pearly gray, turbinates non-edematous without discharge, post-pharynx non erythematous. Neck: Supple without lymphadenopathy. Lungs: Clear to auscultation without wheezing, rhonchi or rales. {no increased work of breathing. CV: Normal S1, S2 without murmurs. Abdomen: Nondistended, nontender. Skin: Warm and dry, without lesions or rashes. Extremities:  No clubbing, cyanosis or edema. Neuro:   Grossly intact.  Diagnositics/Labs:  Spirometry: FEV1: 1.6L 83%, FVC: 2.37L 95%, ratio consistent with nonobstructive pattern  Assessment and plan:   Moderate persistent asthma - under good control at this time! Daily controller medication(s):  Breztri 2 puffs twice a day.  Rinse mouth after use.    Continue Tezspire monthly injections.    Continue montelukast 10mg  daily.  Prior to physical  activity: May use albuterol rescue inhaler 2 puffs 5 to 15 minutes prior to strenuous physical activities. Rescue medications: May use albuterol rescue inhaler 2 puffs or nebulizer every 4 to 6 hours as needed for shortness of breath, chest tightness, coughing, and wheezing. Monitor frequency of use.   Asthma control goals:  Full participation in all desired activities (may need albuterol before activity) Albuterol use two times or less a week on average (not counting use with activity) Cough interfering with sleep two times or less a month Oral steroids no more than once a year No hospitalizations   Perennial allergic rhinitis with a vasomotor rhinitis Continue environmental control measures. Continue montelukast 10mg  daily. Use fluticasone nasal spray 1-2 spray per nostril once a day for nasal congestion. Use nasal Atrovent 2 sprays each nostril up to 3-4 times a day as needed for runny nose/nasal drainage.   If nose gets too dry stop use and can use nasal  saline spray Nasal saline spray (i.e., Simply Saline) or nasal saline lavage (i.e., NeilMed) is recommended as needed and prior to medicated nasal sprays.  GERD (gastroesophageal reflux disease) Continue appropriate reflux lifestyle modifications.  Continue Pantoprazole 1 tab daily.  Continue Famotidine 20 mg twice daily. Continue to limit caffeine intake   Cough Improved with above asthma regimen Continue Gabapentin 300mg  2 times a day   Follow up in 6-12 months or sooner if needed.   I appreciate the opportunity to take part in Blakelee's care. Please do not hesitate to contact me with questions.  Sincerely,   Margo Aye, MD Allergy/Immunology [facility]

## 2023-10-07 NOTE — Patient Instructions (Addendum)
 Moderate persistent asthma - under good control at this time! Daily controller medication(s):  Breztri 2 puffs twice a day.  Rinse mouth after use.    Continue Tezspire monthly injections.    Continue montelukast 10mg  daily.  Prior to physical activity: May use albuterol rescue inhaler 2 puffs 5 to 15 minutes prior to strenuous physical activities. Rescue medications: May use albuterol rescue inhaler 2 puffs or nebulizer every 4 to 6 hours as needed for shortness of breath, chest tightness, coughing, and wheezing. Monitor frequency of use.   Asthma control goals:  Full participation in all desired activities (may need albuterol before activity) Albuterol use two times or less a week on average (not counting use with activity) Cough interfering with sleep two times or less a month Oral steroids no more than once a year No hospitalizations   Perennial allergic rhinitis with a vasomotor rhinitis Continue environmental control measures. Continue montelukast 10mg  daily. Use fluticasone nasal spray 1-2 spray per nostril once a day for nasal congestion. Use nasal Atrovent 2 sprays each nostril up to 3-4 times a day as needed for runny nose/nasal drainage.   If nose gets too dry stop use and can use nasal saline spray Nasal saline spray (i.e., Simply Saline) or nasal saline lavage (i.e., NeilMed) is recommended as needed and prior to medicated nasal sprays.  GERD (gastroesophageal reflux disease) Continue appropriate reflux lifestyle modifications.  Continue Pantoprazole 1 tab daily.  Continue Famotidine 20 mg twice daily. Continue to limit caffeine intake   Cough Improved with above asthma regimen Continue Gabapentin 300mg  2 times a day   Follow up in 6-12 months or sooner if needed.

## 2023-10-08 ENCOUNTER — Other Ambulatory Visit: Payer: Self-pay

## 2023-10-16 NOTE — Progress Notes (Signed)
 [facility] [address], [place] [address]    Clinic Day:  10/17/2023  Referring physician: Carylon Perches, MD  Patient Care Team: Carylon Perches, MD as PCP - General (Internal Medicine) Jena Gauss Gerrit Friends, MD as Attending Physician (Gastroenterology) Storm Frisk, MD as Consulting Physician (Pulmonary Disease) Doreatha Massed, MD as Medical Oncologist (Hematology)   ASSESSMENT & PLAN:   Assessment: 1.  Macrocytic anemia and thrombocytopenia: - Patient seen at the request of Dr. Ouida Sills for abnormal CBC. - 04/17/2022: WBC 15.8, Hb-9.7, PLT-128 - 03/24/2022: WBC-8.1 (N-43%, L-17%, M-19%, B-2%), Hb-9.6, MCV-99, PLT-115 - Smear review: 13% metamyelocytes, 2% myelocytes, 2% blasts,  - 10/19/2021: WBC-5.3 (N-61, L-21, M-10%), Hb-11, MCV-96, PLT-97 - BMBX (05/25/2022): Hypercellular bone marrow with myeloid hyperplasia with dysgranulopoiesis, erythroid hypoplasia and megakaryocytic hyperplasia with dyspoiesis.  Chromosome analysis was 108, XX.  Bone marrow blasts less than 5%.  Peripheral blood blasts less than 2%. - Mayo molecular model risk stratification: Intermediate 2 risk with at least 2 points (decreased hemoglobin less than 10, circulating immature cells).  Intermediate 2 risk with median OS 31 months. - Serum EPO level 41. - NGS: Positive for CBL, MPL, SRSF2, TET 2, RAD21 - PET scan (07/08/2022): Splenomegaly (volume 510 mm) with normal metabolic activity.  Uniform increase in marrow metabolic activity related to patient's anemia.  No lymphadenopathy. - BMBX (07/16/2022): Hypercellular bone marrow (95-100%) with myeloid hyperplasia, atypical monocytes, increased blasts (16% overall).  Atypical monocytosis present 23% of total events, expressing HLA-DR, CD38, CD4 dim, CD11C, CD13, CD14, CD36, CD64 with aberrant coexpression of CD56 and CD7. - Cycle 1 of azacitidine on 08/02/2022, cycle 2 on 08/30/2022.  Azacitidine decreased to 5 days starting cycle 5 on 11/22/2022 - BMBX  (09/21/2022): 3% blasts, hypercellular marrow with markedly increased dyspoietic megakaryocytes, increased fibrosis - BMBX (11/16/2022): Hypercellular marrow (70-80%) with marked GIST [name] (, myelofibrosis and 3% blasts on a very limited sample. - BMBX (05/06/2023): Hypercellular marrow with 85% cellularity with fibrosis and 4% blasts approximately. - BMBX (07/08/2023): Hypercellular marrow (95%) with increased myelopoiesis, dysmegakrypoiesis, increased fibrosis and 5% blasts.  Cytogenetics 64, XX. - NGS (07/08/2023): MPL, SRSF2, RUNX1, TET2 - BMBX (09/13/2023): Hypercellular marrow (100%) with increased myelopoiesis and this megakaryocytes.  Moderate myelofibrosis, grade 2 of 3.  No increase in blasts (1%).   2.  Social/family history: - She lives at home by herself.  Son lives next door. - Does part-time work at [employer].  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  Non-smoker. - Maternal aunt had tumor in the breast, patient not certain if it is cancer.  2 maternal first cousins had lymphoma.  Mother had stomach cancer.    Plan: 1.  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 - She did not experience any infections or other side effects in the last 4 weeks. - Reviewed labs today: Normal LFTs and creatinine.  LDH is 627. - CBC shows white count 3.0 with normal ANC.  Platelet count is 159.  Hemoglobin 9.3. - Proceed with cycle 15 today.  She has bone marrow biopsy scheduled on 11/07/2023.  RTC 4 weeks for follow-up.   2.  Folate deficiency: - Continue folic acid tablet daily.   3.  Weight loss: - She has lost 2 pounds in the last 4 weeks.  Continue boost/Ensure +1 to 2 cans/day.   4.  Constipation: - Continue MiraLAX every other day which  is helping.    Orders Placed This Encounter  Procedures   Comprehensive metabolic panel    Standing Status:   Future    Expected Date:   12/12/2023     Expiration Date:   12/11/2024   Magnesium    Standing Status:   Future    Expected Date:   12/12/2023    Expiration Date:   12/11/2024   Lactate dehydrogenase    Standing Status:   Future    Expected Date:   12/12/2023    Expiration Date:   12/11/2024   CMP (Cancer Center only)    Standing Status:   Future    Expected Date:   12/12/2023    Expiration Date:   12/11/2024   CBC with Differential    Standing Status:   Future    Expected Date:   12/12/2023    Expiration Date:   12/11/2024      I,Katie Daubenspeck,acting as a scribe for Doreatha Massed, MD.,have documented all relevant documentation on the behalf of Doreatha Massed, MD,as directed by  Doreatha Massed, MD while in the presence of Doreatha Massed, MD.   I, Doreatha Massed MD, have reviewed the above documentation for accuracy and completeness, and I agree with the above.   Doreatha Massed, MD   3/31/20259:19 AM  CHIEF COMPLAINT:   Diagnosis: CMML-2    Cancer Staging  No matching staging information was found for the patient.    Prior Therapy: none  Current Therapy:  Azacitidine 70 mg/m x 7 days every 28 days    HISTORY OF PRESENT ILLNESS:   Oncology History  CMML (chronic myelomonocytic leukemia) (HCC)  06/03/2022 Initial Diagnosis   CMML (chronic myelomonocytic leukemia) (HCC)   08/02/2022 -  Chemotherapy   Patient is on Treatment Plan : MYELODYSPLASIA  Azacitidine IVPB D1-7 q28d        INTERVAL HISTORY:   [name] is a 73 y.o. female presenting to clinic today for follow up of CMML-2. She was last seen by me on 09/19/23.  Today, she states that she is doing well overall. Her appetite level is at 100%. Her energy level is at 85%.  PAST MEDICAL HISTORY:   Past Medical History: Past Medical History:  Diagnosis Date   Anxiety    Asthma    had 1 episode 1 year ago-no more problem   CHF (congestive heart failure) (HCC)    swelling of feet & ankles   CMML (chronic myelomonocytic  leukemia) (HCC) 06/03/2022   Cough    Depression    OCD   GERD (gastroesophageal reflux disease)    severe   OCD (obsessive compulsive disorder)    Port-A-Cath in place 07/26/2022   Shortness of breath    with exertion   Yeast infection 04/09/2014    Surgical History: Past Surgical History:  Procedure Laterality Date   CARPAL TUNNEL RELEASE  03/22/2012   Procedure: CARPAL TUNNEL RELEASE;  Surgeon: Nicki Reaper, MD;  Location: [facility];  Service: Orthopedics;  Laterality: Right;  right carpal tunnel release   COLONOSCOPY  06/19/2012   Dr. Rourk:normal rectum and colon    ESOPHAGOGASTRODUODENOSCOPY N/A 12/26/2015   Dr. Jena Gauss: normal    IR IMAGING GUIDED PORT INSERTION  07/30/2022   TONSILLECTOMY     TUBAL LIGATION      Social History: Social History   Socioeconomic History   Marital status: Widowed    Spouse name: Not on file   Number of children: 1   Years  of education: Not on file   Highest education level: Not on file  Occupational History   Occupation: retired  Tobacco Use   Smoking status: Never    Passive exposure: Yes   Smokeless tobacco: Never  Vaping Use   Vaping status: Never Used  Substance and Sexual Activity   Alcohol use: No   Drug use: No   Sexual activity: Yes    Birth control/protection: Surgical    Comment: tubal  Other Topics Concern   Not on file  Social History Narrative   Not on file   Social Drivers of Health   Financial Resource Strain: Not on file  Food Insecurity: Not on file  Transportation Needs: Not on file  Physical Activity: Not on file  Stress: Not on file  Social Connections: Not on file  Intimate Partner Violence: Not on file    Family History: Family History  Problem Relation Age of Onset   Asthma Mother    Macular degeneration Mother    Hypertension Mother    Stomach cancer Mother 27   Alzheimer's disease Father    Other Sister        blood clots   Diabetes Maternal Grandfather    Diabetes  Paternal Grandfather    Other Son        enlarged spleen   Lymphoma Cousin        x2, both dx in their 29s   Colon cancer Neg Hx     Current Medications:  Current Outpatient Medications:    albuterol (VENTOLIN HFA) 108 (90 Base) MCG/ACT inhaler, INHALE TWO PUFFS EVERY 4 HOURS AS NEEDED FOR WHEEZING OR FOR SHORTNESS OF BREATH, Disp: 18 g, Rfl: 1   albuterol (VENTOLIN HFA) 108 (90 Base) MCG/ACT inhaler, INHALE 2 PUFFS BY MOUTH EVERY 4 HOURS AS NEEDED FOR SHORTNESS OF BREATH OR WHEEZING., Disp: 18 g, Rfl: 1   azaCITIDine 5 mg/2 mLs in lactated ringers infusion, Inject into the vein daily. Days 1-7 every 28 days, Disp: , Rfl:    azelastine (ASTELIN) 0.1 % nasal spray, SPRAY 1 TO 2 SPRAYS PER NOSTRIL TWICE DAILY., Disp: 30 mL, Rfl: 3   benzonatate (TESSALON) 200 MG capsule, Take 200 mg by mouth 3 (three) times daily as needed., Disp: , Rfl:    BREZTRI AEROSPHERE 160-9-4.8 MCG/ACT AERO, Inhale 2 puffs into the lungs 2 (two) times daily., Disp: 10.7 g, Rfl: 5   cetirizine (ZYRTEC) 5 MG tablet, Take 1 tablet (5 mg total) by mouth daily., Disp: 30 tablet, Rfl: 0   ciclopirox (PENLAC) 8 % solution, APPLY TOPICALLY AT BEDTIME. APPLY OVER NAIL FOLD AND SURROUNDING SKIN. APPLY DAILY OVER PREVIOUS COAT. AFTER SEVEN DAYS, MAY REMOVE WITH ALCOHOL AND CONTINUE CYCLE., Disp: 6.6 mL, Rfl: 2   EPINEPHrine 0.3 mg/0.3 mL IJ SOAJ injection, Inject 0.3 mg into the muscle as needed for anaphylaxis., Disp: 2 each, Rfl: 1   famotidine (PEPCID) 20 MG tablet, Take 1 tablet (20 mg total) by mouth 2 (two) times daily., Disp: 60 tablet, Rfl: 5   FLUoxetine (PROZAC) 20 MG capsule, Take 1 capsule by mouth daily., Disp: , Rfl:    fluticasone (FLONASE) 50 MCG/ACT nasal spray, Place 2 sprays into both nostrils 2 (two) times daily., Disp: 16 g, Rfl: 5   folic acid (FOLVITE) 1 MG tablet, TAKE ONE TABLET BY MOUTH ONCE DAILY, Disp: 30 tablet, Rfl: 5   gabapentin (NEURONTIN) 300 MG capsule, , Disp: , Rfl:    HYDROcodone  bit-homatropine (HYCODAN) 5-1.5 MG/5ML syrup, ,  Disp: , Rfl:    ipratropium (ATROVENT) 0.06 % nasal spray, USE 2 SPRAYS IN EACH NOSTRIL THREE TIMES A DAY AS NEEDED., Disp: 15 mL, Rfl: 5   lactulose (CHRONULAC) 10 GM/15ML solution, Take 15 mLs (10 g total) by mouth every 3 (three) hours as needed for mild constipation., Disp: 450 mL, Rfl: 2   lidocaine-prilocaine (EMLA) cream, Apply a quarter sized amount to port a cath site and cover with plastic wrap one hour prior to infusion appointments, Disp: 30 g, Rfl: 3   megestrol (MEGACE) 400 MG/10ML suspension, Take 10 mLs (400 mg total) by mouth 2 (two) times daily., Disp: 480 mL, Rfl: 2   montelukast (SINGULAIR) 10 MG tablet, Take 1 tablet (10 mg total) by mouth at bedtime., Disp: 30 tablet, Rfl: 5   NON FORMULARY, [facility]  Antifungal (nail)-#1, Disp: , Rfl:    pantoprazole (PROTONIX) 40 MG tablet, Take 1 tablet (40 mg total) by mouth daily., Disp: 30 tablet, Rfl: 5   prochlorperazine (COMPAZINE) 10 MG tablet, Take 1 tablet (10 mg total) by mouth every 6 (six) hours as needed for nausea or vomiting., Disp: 30 tablet, Rfl: 0   Respiratory Therapy Supplies (FLUTTER) DEVI, Use as directed, Disp: 1 each, Rfl: 3   SF 5000 PLUS 1.1 % CREA dental cream, , Disp: , Rfl:    Spacer/Aero-Holding Chambers (AEROCHAMBER PLUS WITH MASK) inhaler, 1 each by Other route See admin instructions. Use with inhaler as instructed., Disp: 1 each, Rfl: 1   Tezepelumab-ekko (TEZSPIRE) 210 MG/1. SOAJ, Inject 210 mg into the skin every 28 (twenty-eight) days., Disp: 1.91 mL, Rfl: 11   tiZANidine (ZANAFLEX) 2 MG tablet, Take 1 tablet (2 mg total) by mouth 2 (two) times daily as needed for muscle spasms., Disp: 15 tablet, Rfl: 0   traZODone (DESYREL) 50 MG tablet, TAKE ONE TABLET BY MOUTH AT BEDTIME, Disp: 30 tablet, Rfl: 3  Current Facility-Administered Medications:    tezepelumab-ekko (TEZSPIRE) 210 MG/1. syringe 210 mg, 210 mg, Subcutaneous, Q28 days, Marcelyn Bruins, MD, 210 mg at 09/30/23 0454  Facility-Administered Medications Ordered in Other Visits:    sodium chloride flush (NS) 0.9 % injection 10 mL, 10 mL, Intravenous, PRN, Doreatha Massed, MD, 10 mL at 10/17/23 0844   Allergies: Allergies  Allergen Reactions   Levonorgestrel-Ethinyl Estrad Cough   Sulfa Antibiotics Other (See Comments)    Unknown- pt unsure of reaction; believes it may be nausea   Sulfamethoxazole-Trimethoprim Other (See Comments)   Cephalosporins Other (See Comments)    Other Reaction: Toxicity    REVIEW OF SYSTEMS:   Review of Systems  Constitutional:  Negative for chills, fatigue and fever.  HENT:   Negative for lump/mass, mouth sores, nosebleeds, sore throat and trouble swallowing.   Eyes:  Negative for eye problems.  Respiratory:  Positive for cough. Negative for shortness of breath.   Cardiovascular:  Negative for chest pain, leg swelling and palpitations.  Gastrointestinal:  Negative for abdominal pain, constipation, diarrhea, nausea and vomiting.  Genitourinary:  Negative for bladder incontinence, difficulty urinating, dysuria, frequency, hematuria and nocturia.   Musculoskeletal:  Negative for arthralgias, back pain, flank pain, myalgias and neck pain.  Skin:  Negative for itching and rash.  Neurological:  Negative for dizziness, headaches and numbness.  Hematological:  Does not bruise/bleed easily.  Psychiatric/Behavioral:  Negative for depression, sleep disturbance and suicidal ideas. The patient is not nervous/anxious.   All other systems reviewed and are negative.    VITALS:   There were  no vitals taken for this visit.  Wt Readings from Last 3 Encounters:  10/17/23 128 lb 1.6 oz (58.1 kg)  09/26/23 130 lb 3.2 oz (59.1 kg)  09/19/23 130 lb 4.7 oz (59.1 kg)    There is no height or weight on file to calculate BMI.  Performance status (ECOG): 1 - Symptomatic but completely ambulatory  PHYSICAL EXAM:   Physical Exam Vitals  and nursing note reviewed. Exam conducted with a chaperone present.  Constitutional:      Appearance: Normal appearance.  Cardiovascular:     Rate and Rhythm: Normal rate and regular rhythm.     Pulses: Normal pulses.     Heart sounds: Normal heart sounds.  Pulmonary:     Effort: Pulmonary effort is normal.     Breath sounds: Normal breath sounds.  Abdominal:     Palpations: Abdomen is soft. There is no hepatomegaly, splenomegaly or mass.     Tenderness: There is no abdominal tenderness.  Musculoskeletal:     Right lower leg: No edema.     Left lower leg: No edema.  Lymphadenopathy:     Cervical: No cervical adenopathy.     Right cervical: No superficial, deep or posterior cervical adenopathy.    Left cervical: No superficial, deep or posterior cervical adenopathy.     Upper Body:     Right upper body: No supraclavicular or axillary adenopathy.     Left upper body: No supraclavicular or axillary adenopathy.  Neurological:     General: No focal deficit present.     Mental Status: She is alert and oriented to person, place, and time.  Psychiatric:        Mood and Affect: Mood normal.        Behavior: Behavior normal.     LABS:   CBC     Component Value Date/Time   WBC 2.8 (L) 09/26/2023 1301   RBC 2.75 (L) 09/26/2023 1301   HGB 8.4 (L) 09/26/2023 1301   HGB 13.1 07/23/2020 1112   HCT 26.9 (L) 09/26/2023 1301   HCT 40.7 07/23/2020 1112   PLT 99 (L) 09/26/2023 1301   MCV 97.8 09/26/2023 1301   MCV 95 07/23/2020 1112   MCH 30.5 09/26/2023 1301   MCHC 31.2 09/26/2023 1301   RDW 20.2 (H) 09/26/2023 1301   RDW 14.2 07/23/2020 1112   LYMPHSABS 0.8 09/26/2023 1301   LYMPHSABS 0.7 07/23/2020 1112   MONOABS 0.0 (L) 09/26/2023 1301   EOSABS 0.1 09/26/2023 1301   EOSABS 0.1 07/23/2020 1112   BASOSABS 0.0 09/26/2023 1301   BASOSABS 0.0 07/23/2020 1112    CMP      Component Value Date/Time   NA 139 10/17/2023 0833   K 3.9 10/17/2023 0833   CL 104 10/17/2023 0833    CO2 26 10/17/2023 0833   GLUCOSE 96 10/17/2023 0833   BUN 12 10/17/2023 0833   CREATININE 0.91 10/17/2023 0833   CALCIUM 9.5 10/17/2023 0833   PROT 7.1 10/17/2023 0833   ALBUMIN 4.1 10/17/2023 0833   AST 32 10/17/2023 0833   ALT 10 10/17/2023 0833   ALKPHOS 47 10/17/2023 0833   BILITOT 1.4 (H) 10/17/2023 0833   GFRNONAA >60 10/17/2023 0833   GFRAA >60 02/24/2019 1415     No results found for: "CEA1", "CEA" / No results found for: "CEA1", "CEA" No results found for: "PSA1" No results found for: "ZOX096" No results found for: "EAV409"  Lab Results  Component Value Date   TOTALPROTELP 6.3 04/26/2022  ALBUMINELP 3.8 04/26/2022   A1GS 0.3 04/26/2022   A2GS 0.5 04/26/2022   BETS 0.8 04/26/2022   GAMS 0.9 04/26/2022   MSPIKE Not Observed 04/26/2022   SPEI Comment 04/26/2022   Lab Results  Component Value Date   TIBC 343 04/26/2022   FERRITIN 114 04/26/2022   IRONPCTSAT 25 04/26/2022   Lab Results  Component Value Date   LDH 627 (H) 10/17/2023   LDH 482 (H) 09/19/2023   LDH 677 (H) 08/22/2023     STUDIES:   MM 3D SCREENING MAMMOGRAM BILATERAL BREAST Result Date: 09/29/2023 CLINICAL DATA:  Screening. EXAM: DIGITAL SCREENING BILATERAL MAMMOGRAM WITH TOMOSYNTHESIS AND CAD TECHNIQUE: Bilateral screening digital craniocaudal and mediolateral oblique mammograms were obtained. Bilateral screening digital breast tomosynthesis was performed. The images were evaluated with computer-aided detection. COMPARISON:  Previous exam(s). ACR Breast Density Category c: The breasts are heterogeneously dense, which may obscure small masses. FINDINGS: There are no findings suspicious for malignancy. IMPRESSION: No mammographic evidence of malignancy. A result letter of this screening mammogram will be mailed directly to the patient. RECOMMENDATION: Screening mammogram in one year. (Code:SM-B-01Y) BI-RADS CATEGORY  1: Negative. Electronically Signed   By: Amie Portland M.D.   On: 09/29/2023 11:29

## 2023-10-17 ENCOUNTER — Inpatient Hospital Stay

## 2023-10-17 ENCOUNTER — Inpatient Hospital Stay (HOSPITAL_BASED_OUTPATIENT_CLINIC_OR_DEPARTMENT_OTHER): Admitting: Hematology

## 2023-10-17 ENCOUNTER — Other Ambulatory Visit: Payer: Self-pay

## 2023-10-17 ENCOUNTER — Encounter: Payer: Self-pay | Admitting: Hematology

## 2023-10-17 VITALS — BP 96/51 | HR 70 | Temp 97.6°F | Resp 18

## 2023-10-17 DIAGNOSIS — D696 Thrombocytopenia, unspecified: Secondary | ICD-10-CM | POA: Diagnosis not present

## 2023-10-17 DIAGNOSIS — C9310 Chronic myelomonocytic leukemia not having achieved remission: Secondary | ICD-10-CM | POA: Diagnosis not present

## 2023-10-17 DIAGNOSIS — Z95828 Presence of other vascular implants and grafts: Secondary | ICD-10-CM

## 2023-10-17 DIAGNOSIS — N946 Dysmenorrhea, unspecified: Secondary | ICD-10-CM | POA: Diagnosis not present

## 2023-10-17 DIAGNOSIS — D539 Nutritional anemia, unspecified: Secondary | ICD-10-CM | POA: Diagnosis not present

## 2023-10-17 DIAGNOSIS — K5900 Constipation, unspecified: Secondary | ICD-10-CM | POA: Diagnosis not present

## 2023-10-17 DIAGNOSIS — E538 Deficiency of other specified B group vitamins: Secondary | ICD-10-CM | POA: Diagnosis not present

## 2023-10-17 DIAGNOSIS — R059 Cough, unspecified: Secondary | ICD-10-CM | POA: Diagnosis not present

## 2023-10-17 DIAGNOSIS — D7581 Myelofibrosis: Secondary | ICD-10-CM | POA: Diagnosis not present

## 2023-10-17 DIAGNOSIS — Z5111 Encounter for antineoplastic chemotherapy: Secondary | ICD-10-CM | POA: Diagnosis not present

## 2023-10-17 LAB — CBC WITH DIFFERENTIAL/PLATELET
Abs Immature Granulocytes: 0.2 10*3/uL — ABNORMAL HIGH (ref 0.00–0.07)
Basophils Absolute: 0 10*3/uL (ref 0.0–0.1)
Basophils Relative: 0 %
Eosinophils Absolute: 0 10*3/uL (ref 0.0–0.5)
Eosinophils Relative: 0 %
HCT: 30.1 % — ABNORMAL LOW (ref 36.0–46.0)
Hemoglobin: 9.3 g/dL — ABNORMAL LOW (ref 12.0–15.0)
Lymphocytes Relative: 24 %
Lymphs Abs: 0.7 10*3/uL (ref 0.7–4.0)
MCH: 30.6 pg (ref 26.0–34.0)
MCHC: 30.9 g/dL (ref 30.0–36.0)
MCV: 99 fL (ref 80.0–100.0)
Metamyelocytes Relative: 4 %
Monocytes Absolute: 0.2 10*3/uL (ref 0.1–1.0)
Monocytes Relative: 7 %
Myelocytes: 3 %
Neutro Abs: 1.9 10*3/uL (ref 1.7–7.7)
Neutrophils Relative %: 62 %
Platelets: 159 10*3/uL (ref 150–400)
RBC: 3.04 MIL/uL — ABNORMAL LOW (ref 3.87–5.11)
RDW: 20.8 % — ABNORMAL HIGH (ref 11.5–15.5)
WBC: 3 10*3/uL — ABNORMAL LOW (ref 4.0–10.5)
nRBC: 2.3 % — ABNORMAL HIGH (ref 0.0–0.2)
nRBC: 5 /100{WBCs} — ABNORMAL HIGH

## 2023-10-17 LAB — COMPREHENSIVE METABOLIC PANEL WITH GFR
ALT: 10 U/L (ref 0–44)
AST: 32 U/L (ref 15–41)
Albumin: 4.1 g/dL (ref 3.5–5.0)
Alkaline Phosphatase: 47 U/L (ref 38–126)
Anion gap: 9 (ref 5–15)
BUN: 12 mg/dL (ref 8–23)
CO2: 26 mmol/L (ref 22–32)
Calcium: 9.5 mg/dL (ref 8.9–10.3)
Chloride: 104 mmol/L (ref 98–111)
Creatinine, Ser: 0.91 mg/dL (ref 0.44–1.00)
GFR, Estimated: >60 mL/min (ref >60)
Glucose, Bld: 96 mg/dL (ref 70–99)
Potassium: 3.9 mmol/L (ref 3.5–5.1)
Sodium: 139 mmol/L (ref 135–145)
Total Bilirubin: 1.4 mg/dL — ABNORMAL HIGH (ref 0.0–1.2)
Total Protein: 7.1 g/dL (ref 6.5–8.1)

## 2023-10-17 LAB — LACTATE DEHYDROGENASE: LDH: 627 U/L — ABNORMAL HIGH (ref 98–192)

## 2023-10-17 LAB — MAGNESIUM: Magnesium: 2 mg/dL (ref 1.7–2.4)

## 2023-10-17 MED ORDER — HEPARIN SOD (PORK) LOCK FLUSH 100 UNIT/ML IV SOLN
500.0000 [IU] | Freq: Once | INTRAVENOUS | Status: AC
  Administered 2023-10-17: 500 [IU] via INTRAVENOUS

## 2023-10-17 MED ORDER — SODIUM CHLORIDE 0.9 % IV SOLN
75.0000 mg/m2 | Freq: Once | INTRAVENOUS | Status: AC
  Administered 2023-10-17: 126 mg via INTRAVENOUS
  Filled 2023-10-17: qty 12.6

## 2023-10-17 MED ORDER — SODIUM CHLORIDE 0.9% FLUSH
10.0000 mL | INTRAVENOUS | Status: DC | PRN
  Administered 2023-10-17: 10 mL via INTRAVENOUS

## 2023-10-17 MED ORDER — PALONOSETRON HCL INJECTION 0.25 MG/5ML
0.2500 mg | Freq: Once | INTRAVENOUS | Status: AC
  Administered 2023-10-17: 0.25 mg via INTRAVENOUS
  Filled 2023-10-17: qty 5

## 2023-10-17 MED ORDER — SODIUM CHLORIDE 0.9 % IV SOLN: Freq: Once | INTRAVENOUS | Status: AC

## 2023-10-17 MED ORDER — SODIUM CHLORIDE 0.9% FLUSH
10.0000 mL | Freq: Once | INTRAVENOUS | Status: AC
  Administered 2023-10-17: 10 mL via INTRAVENOUS

## 2023-10-17 NOTE — Progress Notes (Signed)
 Specialty Pharmacy Refill Coordination Note  [name] is a 73 y.o. female contacted today regarding refills of specialty medication(s) [name] [name])   Patient requested Courier to Provider Office   Delivery date: 10/20/23   Verified address: [facility] [address] [place] [address]   Medication will be filled on 10/19/23.

## 2023-10-17 NOTE — Progress Notes (Signed)
Patients port flushed without difficulty.  Good blood return noted with no bruising or swelling noted at site.  Remains accessed for treatment.

## 2023-10-17 NOTE — Patient Instructions (Signed)

## 2023-10-17 NOTE — Progress Notes (Signed)
 Patient presents today for Vidaza infusion per providers order.  Vital signs and labs reviewed by MD.  Message received from Anastasio Champion RN/Dr. Delton Coombes patient okay for treatment.  Treatment given today per MD orders.  Stable during infusion without adverse affects.  Vital signs stable.  No complaints at this time.  Discharge from clinic ambulatory in stable condition.  Alert and oriented X 3.  Follow up with [facility] as scheduled.

## 2023-10-17 NOTE — Progress Notes (Signed)
 Patient has been examined by Dr. Ellin Saba. Vital signs and labs have been reviewed by MD - ANC, Creatinine, LFTs, hemoglobin, and platelets are within treatment parameters per M.D. - pt may proceed with treatment.  Primary RN and pharmacy notified.

## 2023-10-17 NOTE — Patient Instructions (Signed)
 [facility] [place] [facility]  Discharge Instructions: Thank you for choosing [facility] to provide your oncology and hematology care.  If you have a lab appointment with the Cancer Center - please note that after April 8th, 2024, all labs will be drawn in the cancer center.  You do not have to check in or register with the main entrance as you have in the past but will complete your check-in in the cancer center.  Wear comfortable clothing and clothing appropriate for easy access to any Portacath or PICC line.   We strive to give you quality time with your provider. You may need to reschedule your appointment if you arrive late (15 or more minutes).  Arriving late affects you and other patients whose appointments are after yours.  Also, if you miss three or more appointments without notifying the office, you may be dismissed from the clinic at the provider's discretion.      For prescription refill requests, have your pharmacy contact our office and allow 72 hours for refills to be completed.    Today you received the following chemotherapy and/or immunotherapy agents Vidaza      To help prevent nausea and vomiting after your treatment, we encourage you to take your nausea medication as directed.  BELOW ARE SYMPTOMS THAT SHOULD BE REPORTED IMMEDIATELY: *FEVER GREATER THAN 100.4 F (38 C) OR HIGHER *CHILLS OR SWEATING *NAUSEA AND VOMITING THAT IS NOT CONTROLLED WITH YOUR NAUSEA MEDICATION *UNUSUAL SHORTNESS OF BREATH *UNUSUAL BRUISING OR BLEEDING *URINARY PROBLEMS (pain or burning when urinating, or frequent urination) *BOWEL PROBLEMS (unusual diarrhea, constipation, pain near the anus) TENDERNESS IN MOUTH AND THROAT WITH OR WITHOUT PRESENCE OF ULCERS (sore throat, sores in mouth, or a toothache) UNUSUAL RASH, SWELLING OR PAIN  UNUSUAL VAGINAL DISCHARGE OR ITCHING   Items with * indicate a potential emergency and should be followed up as  soon as possible or go to the Emergency Department if any problems should occur.  Please show the CHEMOTHERAPY ALERT CARD or IMMUNOTHERAPY ALERT CARD at check-in to the Emergency Department and triage nurse.  Should you have questions after your visit or need to cancel or reschedule your appointment, please contact [facility] [place] - A DEPT OF Eligha Bridegroom [facility] [phone]  and follow the prompts.  Office hours are 8:00 a.m. to 4:30 p.m. Monday - Friday. Please note that voicemails left after 4:00 p.m. may not be returned until the following business day.  We are closed weekends and major holidays. You have access to a nurse at all times for urgent questions. Please call the main number to the clinic [phone] and follow the prompts.  For any non-urgent questions, you may also contact your provider using MyChart. We now offer e-Visits for anyone 72 and older to request care online for non-urgent symptoms. For details visit [email or web address].   Also download the MyChart app! Go to the app store, search "MyChart", open the app, select [place], and log in with your MyChart username and password.

## 2023-10-18 ENCOUNTER — Inpatient Hospital Stay: Attending: Hematology

## 2023-10-18 VITALS — BP 100/58 | HR 72 | Temp 98.0°F | Resp 18

## 2023-10-18 DIAGNOSIS — Z79899 Other long term (current) drug therapy: Secondary | ICD-10-CM | POA: Diagnosis not present

## 2023-10-18 DIAGNOSIS — D696 Thrombocytopenia, unspecified: Secondary | ICD-10-CM | POA: Diagnosis not present

## 2023-10-18 DIAGNOSIS — C9310 Chronic myelomonocytic leukemia not having achieved remission: Secondary | ICD-10-CM | POA: Diagnosis not present

## 2023-10-18 DIAGNOSIS — Z95828 Presence of other vascular implants and grafts: Secondary | ICD-10-CM

## 2023-10-18 DIAGNOSIS — Z5111 Encounter for antineoplastic chemotherapy: Secondary | ICD-10-CM | POA: Insufficient documentation

## 2023-10-18 MED ORDER — SODIUM CHLORIDE 0.9 % IV SOLN
75.0000 mg/m2 | Freq: Once | INTRAVENOUS | Status: AC
  Administered 2023-10-18: 126 mg via INTRAVENOUS
  Filled 2023-10-18: qty 12.6

## 2023-10-18 MED ORDER — SODIUM CHLORIDE 0.9% FLUSH
10.0000 mL | Freq: Once | INTRAVENOUS | Status: AC
  Administered 2023-10-18: 10 mL via INTRAVENOUS

## 2023-10-18 MED ORDER — SODIUM CHLORIDE 0.9 % IV SOLN: Freq: Once | INTRAVENOUS | Status: AC

## 2023-10-18 MED ORDER — HEPARIN SOD (PORK) LOCK FLUSH 100 UNIT/ML IV SOLN
500.0000 [IU] | Freq: Once | INTRAVENOUS | Status: AC
  Administered 2023-10-18: 500 [IU] via INTRAVENOUS

## 2023-10-18 NOTE — Patient Instructions (Signed)
 [facility] [place] - A DEPT OF [facility]  Discharge Instructions: Thank you for choosing [facility] to provide your oncology and hematology care.  If you have a lab appointment with the Cancer Center - please note that after April 8th, 2024, all labs will be drawn in the cancer center.  You do not have to check in or register with the main entrance as you have in the past but will complete your check-in in the cancer center.  Wear comfortable clothing and clothing appropriate for easy access to any Portacath or PICC line.   We strive to give you quality time with your provider. You may need to reschedule your appointment if you arrive late (15 or more minutes).  Arriving late affects you and other patients whose appointments are after yours.  Also, if you miss three or more appointments without notifying the office, you may be dismissed from the clinic at the provider's discretion.      For prescription refill requests, have your pharmacy contact our office and allow 72 hours for refills to be completed.    Today you received the following chemotherapy and/or immunotherapy agents Vidaza.  Azacitidine Injection What is this medication? AZACITIDINE (ay za SITE i deen) treats blood and bone marrow cancers. It works by slowing down the growth of cancer cells. This medicine may be used for other purposes; ask your health care provider or pharmacist if you have questions. COMMON BRAND NAME(S): Vidaza What should I tell my care team before I take this medication? They need to know if you have any of these conditions: Kidney disease Liver disease Low blood cell levels, such as low white cells, platelets, or red blood cells Low levels of albumin in the blood Low levels of bicarbonate in the blood An unusual or allergic reaction to azacitidine, mannitol, other medications, foods, dyes, or preservatives If you or your partner are pregnant or trying to get  pregnant Breast-feeding How should I use this medication? This medication is injected into a vein or under the skin. It is given by your care team in a hospital or clinic setting. Talk to your care team about the use of this medication in children. While it may be prescribed for children as young as 1 month for selected conditions, precautions do apply. Overdosage: If you think you have taken too much of this medicine contact a poison control center or emergency room at once. NOTE: This medicine is only for you. Do not share this medicine with others. What if I miss a dose? Keep appointments for follow-up doses. It is important not to miss your dose. Call your care team if you are unable to keep an appointment. What may interact with this medication? Interactions are not expected. This list may not describe all possible interactions. Give your health care provider a list of all the medicines, herbs, non-prescription drugs, or dietary supplements you use. Also tell them if you smoke, drink alcohol, or use illegal drugs. Some items may interact with your medicine. What should I watch for while using this medication? Your condition will be monitored carefully while you are receiving this medication. This medication may make you feel generally unwell. This is not uncommon as chemotherapy can affect healthy cells as well as cancer cells. Report any side effects. Continue your course of treatment even though you feel ill unless your care team tells you to stop. You may need blood work done while you are  taking this medication. Other product types may be available that contain the medication azacitidine. The injection and oral products should not be used in place of one another. Talk to your care team if you have questions. This medication can cause serious side effects. To reduce the risk, your care team may give you other medications to take before receiving this one. Be sure to follow the directions from  your care team. This medication may increase your risk of getting an infection. Call your care team for advice if you get a fever, chills, sore throat, or other symptoms of a cold or flu. Do not treat yourself. Try to avoid being around people who are sick. Avoid taking medications that contain aspirin, acetaminophen, ibuprofen, naproxen, or ketoprofen unless instructed by your care team. These medications may hide a fever. Be careful brushing or flossing your teeth or using a toothpick because you may get an infection or bleed more easily. If you have any dental work done, tell your dentist you are receiving this medication. Talk to your care team if you or your partner may be pregnant. Serious birth defects can occur if you take this medication during pregnancy and for 6 months after the last dose. You will need a negative pregnancy test before starting this medication. Contraception is recommended while taking this medication and for 6 months after the last dose. Your care team can help you find the option that works for you. If your partner can get pregnant, use a condom during sex while taking this medication and for 3 months after the last dose. Do not breastfeed while taking this medication and for 1 week after the last dose. This medication may cause infertility. Talk to your care team if you are concerned about your fertility. What side effects may I notice from receiving this medication? Side effects that you should report to your care team as soon as possible: Allergic reactions--skin rash, itching, hives, swelling of the face, lips, tongue, or throat Infection--fever, chills, cough, sore throat, wounds that don't heal, pain or trouble when passing urine, general feeling of discomfort or being unwell Kidney injury--decrease in the amount of urine, swelling of the ankles, hands, or feet Liver injury--right upper belly pain, loss of appetite, nausea, light-colored stool, dark yellow or [name]  urine, yellowing skin or eyes, unusual weakness or fatigue Low red blood cell level--unusual weakness or fatigue, dizziness, headache, trouble breathing Tumor lysis syndrome (TLS)--nausea, vomiting, diarrhea, decrease in the amount of urine, dark urine, unusual weakness or fatigue, confusion, muscle pain or cramps, fast or irregular heartbeat, joint pain Unusual bruising or bleeding Side effects that usually do not require medical attention (report to your care team if they continue or are bothersome): Constipation Diarrhea Nausea Pain, redness, or irritation at injection site Vomiting This list may not describe all possible side effects. Call your doctor for medical advice about side effects. You may report side effects to FDA at 1-800-FDA-1088. Where should I keep my medication? This medication is given in a hospital or clinic. It will not be stored at home. NOTE: This sheet is a summary. It may not cover all possible information. If you have questions about this medicine, talk to your doctor, pharmacist, or health care provider.  2024 Elsevier/Gold Standard (2023-03-07 00:00:00)       To help prevent nausea and vomiting after your treatment, we encourage you to take your nausea medication as directed.  BELOW ARE SYMPTOMS THAT SHOULD BE REPORTED IMMEDIATELY: *FEVER GREATER THAN 100.4  F (38 C) OR HIGHER *CHILLS OR SWEATING *NAUSEA AND VOMITING THAT IS NOT CONTROLLED WITH YOUR NAUSEA MEDICATION *UNUSUAL SHORTNESS OF BREATH *UNUSUAL BRUISING OR BLEEDING *URINARY PROBLEMS (pain or burning when urinating, or frequent urination) *BOWEL PROBLEMS (unusual diarrhea, constipation, pain near the anus) TENDERNESS IN MOUTH AND THROAT WITH OR WITHOUT PRESENCE OF ULCERS (sore throat, sores in mouth, or a toothache) UNUSUAL RASH, SWELLING OR PAIN  UNUSUAL VAGINAL DISCHARGE OR ITCHING   Items with * indicate a potential emergency and should be followed up as soon as possible or go to the Emergency  Department if any problems should occur.  Please show the CHEMOTHERAPY ALERT CARD or IMMUNOTHERAPY ALERT CARD at check-in to the Emergency Department and triage nurse.  Should you have questions after your visit or need to cancel or reschedule your appointment, please contact [facility] [place] - A DEPT OF [facility] [phone]  and follow the prompts.  Office hours are 8:00 a.m. to 4:30 p.m. Monday - Friday. Please note that voicemails left after 4:00 p.m. may not be returned until the following business day.  We are closed weekends and major holidays. You have access to a nurse at all times for urgent questions. Please call the main number to the clinic [phone] and follow the prompts.  For any non-urgent questions, you may also contact your provider using MyChart. We now offer e-Visits for anyone 88 and older to request care online for non-urgent symptoms. For details visit [email or web address].   Also download the MyChart app! Go to the app store, search "MyChart", open the app, select [place], and log in with your MyChart username and password.

## 2023-10-18 NOTE — Progress Notes (Signed)
Vidaza given today per MD orders. Tolerated infusion without adverse affects. Vital signs stable. No complaints at this time. Discharged from clinic ambulatory in stable condition. Alert and oriented x 3. F/U with [facility] as scheduled.   

## 2023-10-19 ENCOUNTER — Other Ambulatory Visit: Payer: Self-pay

## 2023-10-19 ENCOUNTER — Inpatient Hospital Stay

## 2023-10-19 VITALS — BP 97/55 | HR 68 | Temp 97.4°F | Resp 18

## 2023-10-19 DIAGNOSIS — Z5111 Encounter for antineoplastic chemotherapy: Secondary | ICD-10-CM | POA: Diagnosis not present

## 2023-10-19 DIAGNOSIS — C9310 Chronic myelomonocytic leukemia not having achieved remission: Secondary | ICD-10-CM

## 2023-10-19 DIAGNOSIS — D696 Thrombocytopenia, unspecified: Secondary | ICD-10-CM | POA: Diagnosis not present

## 2023-10-19 DIAGNOSIS — Z95828 Presence of other vascular implants and grafts: Secondary | ICD-10-CM

## 2023-10-19 DIAGNOSIS — Z79899 Other long term (current) drug therapy: Secondary | ICD-10-CM | POA: Diagnosis not present

## 2023-10-19 MED ORDER — SODIUM CHLORIDE 0.9 % IV SOLN: Freq: Once | INTRAVENOUS | Status: AC

## 2023-10-19 MED ORDER — SODIUM CHLORIDE 0.9% FLUSH
10.0000 mL | Freq: Once | INTRAVENOUS | Status: AC
  Administered 2023-10-19: 10 mL via INTRAVENOUS

## 2023-10-19 MED ORDER — PALONOSETRON HCL INJECTION 0.25 MG/5ML
0.2500 mg | Freq: Once | INTRAVENOUS | Status: AC
  Administered 2023-10-19: 0.25 mg via INTRAVENOUS
  Filled 2023-10-19: qty 5

## 2023-10-19 MED ORDER — HEPARIN SOD (PORK) LOCK FLUSH 100 UNIT/ML IV SOLN
500.0000 [IU] | Freq: Once | INTRAVENOUS | Status: AC
  Administered 2023-10-19: 500 [IU] via INTRAVENOUS

## 2023-10-19 MED ORDER — SODIUM CHLORIDE 0.9 % IV SOLN
75.0000 mg/m2 | Freq: Once | INTRAVENOUS | Status: AC
  Administered 2023-10-19: 126 mg via INTRAVENOUS
  Filled 2023-10-19: qty 12.6

## 2023-10-19 NOTE — Patient Instructions (Signed)
 [facility] - A DEPT OF [facility]  Discharge Instructions: Thank you for choosing [facility] to provide your oncology and hematology care.  If you have a lab appointment with the Cancer Center - please note that after April 8th, 2024, all labs will be drawn in the cancer center.  You do not have to check in or register with the main entrance as you have in the past but will complete your check-in in the cancer center.  Wear comfortable clothing and clothing appropriate for easy access to any Portacath or PICC line.   We strive to give you quality time with your provider. You may need to reschedule your appointment if you arrive late (15 or more minutes).  Arriving late affects you and other patients whose appointments are after yours.  Also, if you miss three or more appointments without notifying the office, you may be dismissed from the clinic at the provider's discretion.      For prescription refill requests, have your pharmacy contact our office and allow 72 hours for refills to be completed.    Today you received the following chemotherapy and/or immunotherapy agents Vidaza, return as scheduled.   To help prevent nausea and vomiting after your treatment, we encourage you to take your nausea medication as directed.  BELOW ARE SYMPTOMS THAT SHOULD BE REPORTED IMMEDIATELY: *FEVER GREATER THAN 100.4 F (38 C) OR HIGHER *CHILLS OR SWEATING *NAUSEA AND VOMITING THAT IS NOT CONTROLLED WITH YOUR NAUSEA MEDICATION *UNUSUAL SHORTNESS OF BREATH *UNUSUAL BRUISING OR BLEEDING *URINARY PROBLEMS (pain or burning when urinating, or frequent urination) *BOWEL PROBLEMS (unusual diarrhea, constipation, pain near the anus) TENDERNESS IN MOUTH AND THROAT WITH OR WITHOUT PRESENCE OF ULCERS (sore throat, sores in mouth, or a toothache) UNUSUAL RASH, SWELLING OR PAIN  UNUSUAL VAGINAL DISCHARGE OR ITCHING   Items with * indicate a potential emergency and  should be followed up as soon as possible or go to the Emergency Department if any problems should occur.  Please show the CHEMOTHERAPY ALERT CARD or IMMUNOTHERAPY ALERT CARD at check-in to the Emergency Department and triage nurse.  Should you have questions after your visit or need to cancel or reschedule your appointment, please contact [facility]  - A DEPT OF [facility] [phone]  and follow the prompts.  Office hours are 8:00 a.m. to 4:30 p.m. Monday - Friday. Please note that voicemails left after 4:00 p.m. may not be returned until the following business day.  We are closed weekends and major holidays. You have access to a nurse at all times for urgent questions. Please call the main number to the clinic [phone] and follow the prompts.  For any non-urgent questions, you may also contact your provider using MyChart. We now offer e-Visits for anyone 68 and older to request care online for non-urgent symptoms. For details visit [email or web address].   Also download the MyChart app! Go to the app store, search "MyChart", open the app, select [place], and log in with your MyChart username and password.

## 2023-10-19 NOTE — Progress Notes (Signed)
Patient tolerated chemotherapy with no complaints voiced. Side effects with management reviewed understanding verbalized. Port site clean and dry with no bruising or swelling noted at site. Good blood return noted before and after administration of chemotherapy.  Patient left in satisfactory condition with VSS and no s/s of distress noted. 

## 2023-10-20 ENCOUNTER — Inpatient Hospital Stay

## 2023-10-20 VITALS — BP 107/62 | HR 73 | Temp 98.2°F | Resp 18

## 2023-10-20 DIAGNOSIS — C9310 Chronic myelomonocytic leukemia not having achieved remission: Secondary | ICD-10-CM

## 2023-10-20 DIAGNOSIS — Z79899 Other long term (current) drug therapy: Secondary | ICD-10-CM | POA: Diagnosis not present

## 2023-10-20 DIAGNOSIS — Z95828 Presence of other vascular implants and grafts: Secondary | ICD-10-CM

## 2023-10-20 DIAGNOSIS — D696 Thrombocytopenia, unspecified: Secondary | ICD-10-CM | POA: Diagnosis not present

## 2023-10-20 DIAGNOSIS — Z5111 Encounter for antineoplastic chemotherapy: Secondary | ICD-10-CM | POA: Diagnosis not present

## 2023-10-20 MED ORDER — SODIUM CHLORIDE 0.9% FLUSH
10.0000 mL | Freq: Once | INTRAVENOUS | Status: AC
  Administered 2023-10-20: 10 mL via INTRAVENOUS

## 2023-10-20 MED ORDER — SODIUM CHLORIDE 0.9 % IV SOLN: Freq: Once | INTRAVENOUS | Status: AC

## 2023-10-20 MED ORDER — SODIUM CHLORIDE 0.9 % IV SOLN
75.0000 mg/m2 | Freq: Once | INTRAVENOUS | Status: AC
  Administered 2023-10-20: 126 mg via INTRAVENOUS
  Filled 2023-10-20: qty 12.6

## 2023-10-20 MED ORDER — HEPARIN SOD (PORK) LOCK FLUSH 100 UNIT/ML IV SOLN
500.0000 [IU] | Freq: Once | INTRAVENOUS | Status: AC
  Administered 2023-10-20: 500 [IU] via INTRAVENOUS

## 2023-10-20 NOTE — Progress Notes (Signed)
Patient tolerated chemotherapy with no complaints voiced. Side effects with management reviewed understanding verbalized. Port site clean and dry with no bruising or swelling noted at site. Good blood return noted before and after administration of chemotherapy.  Patient left in satisfactory condition with VSS and no s/s of distress noted. 

## 2023-10-20 NOTE — Patient Instructions (Signed)
 [facility] - A DEPT OF [facility]  Discharge Instructions: Thank you for choosing [facility] to provide your oncology and hematology care.  If you have a lab appointment with the Cancer Center - please note that after April 8th, 2024, all labs will be drawn in the cancer center.  You do not have to check in or register with the main entrance as you have in the past but will complete your check-in in the cancer center.  Wear comfortable clothing and clothing appropriate for easy access to any Portacath or PICC line.   We strive to give you quality time with your provider. You may need to reschedule your appointment if you arrive late (15 or more minutes).  Arriving late affects you and other patients whose appointments are after yours.  Also, if you miss three or more appointments without notifying the office, you may be dismissed from the clinic at the provider's discretion.      For prescription refill requests, have your pharmacy contact our office and allow 72 hours for refills to be completed.    Today you received the following chemotherapy and/or immunotherapy agents Vidaza, return as scheduled.   To help prevent nausea and vomiting after your treatment, we encourage you to take your nausea medication as directed.  BELOW ARE SYMPTOMS THAT SHOULD BE REPORTED IMMEDIATELY: *FEVER GREATER THAN 100.4 F (38 C) OR HIGHER *CHILLS OR SWEATING *NAUSEA AND VOMITING THAT IS NOT CONTROLLED WITH YOUR NAUSEA MEDICATION *UNUSUAL SHORTNESS OF BREATH *UNUSUAL BRUISING OR BLEEDING *URINARY PROBLEMS (pain or burning when urinating, or frequent urination) *BOWEL PROBLEMS (unusual diarrhea, constipation, pain near the anus) TENDERNESS IN MOUTH AND THROAT WITH OR WITHOUT PRESENCE OF ULCERS (sore throat, sores in mouth, or a toothache) UNUSUAL RASH, SWELLING OR PAIN  UNUSUAL VAGINAL DISCHARGE OR ITCHING   Items with * indicate a potential emergency and  should be followed up as soon as possible or go to the Emergency Department if any problems should occur.  Please show the CHEMOTHERAPY ALERT CARD or IMMUNOTHERAPY ALERT CARD at check-in to the Emergency Department and triage nurse.  Should you have questions after your visit or need to cancel or reschedule your appointment, please contact [facility]  - A DEPT OF [facility] [phone]  and follow the prompts.  Office hours are 8:00 a.m. to 4:30 p.m. Monday - Friday. Please note that voicemails left after 4:00 p.m. may not be returned until the following business day.  We are closed weekends and major holidays. You have access to a nurse at all times for urgent questions. Please call the main number to the clinic [phone] and follow the prompts.  For any non-urgent questions, you may also contact your provider using MyChart. We now offer e-Visits for anyone 68 and older to request care online for non-urgent symptoms. For details visit [email or web address].   Also download the MyChart app! Go to the app store, search "MyChart", open the app, select [place], and log in with your MyChart username and password.

## 2023-10-21 ENCOUNTER — Inpatient Hospital Stay

## 2023-10-21 VITALS — BP 108/77 | HR 69 | Temp 97.8°F | Resp 18

## 2023-10-21 DIAGNOSIS — C9310 Chronic myelomonocytic leukemia not having achieved remission: Secondary | ICD-10-CM

## 2023-10-21 DIAGNOSIS — Z79899 Other long term (current) drug therapy: Secondary | ICD-10-CM | POA: Diagnosis not present

## 2023-10-21 DIAGNOSIS — Z5111 Encounter for antineoplastic chemotherapy: Secondary | ICD-10-CM | POA: Diagnosis not present

## 2023-10-21 DIAGNOSIS — Z95828 Presence of other vascular implants and grafts: Secondary | ICD-10-CM

## 2023-10-21 DIAGNOSIS — D696 Thrombocytopenia, unspecified: Secondary | ICD-10-CM | POA: Diagnosis not present

## 2023-10-21 MED ORDER — SODIUM CHLORIDE 0.9% FLUSH
10.0000 mL | INTRAVENOUS | Status: DC | PRN
  Administered 2023-10-21: 10 mL via INTRAVENOUS

## 2023-10-21 MED ORDER — SODIUM CHLORIDE 0.9 % IV SOLN: Freq: Once | INTRAVENOUS | Status: AC

## 2023-10-21 MED ORDER — PALONOSETRON HCL INJECTION 0.25 MG/5ML
0.2500 mg | Freq: Once | INTRAVENOUS | Status: AC
  Administered 2023-10-21: 0.25 mg via INTRAVENOUS
  Filled 2023-10-21: qty 5

## 2023-10-21 MED ORDER — HEPARIN SOD (PORK) LOCK FLUSH 100 UNIT/ML IV SOLN
500.0000 [IU] | Freq: Once | INTRAVENOUS | Status: AC
  Administered 2023-10-21: 500 [IU] via INTRAVENOUS

## 2023-10-21 MED ORDER — SODIUM CHLORIDE 0.9 % IV SOLN
75.0000 mg/m2 | Freq: Once | INTRAVENOUS | Status: AC
  Administered 2023-10-21: 126 mg via INTRAVENOUS
  Filled 2023-10-21: qty 12.6

## 2023-10-21 NOTE — Patient Instructions (Signed)
 [facility] [place] - A DEPT OF [facility]  Discharge Instructions: Thank you for choosing [facility] to provide your oncology and hematology care.  If you have a lab appointment with the Cancer Center - please note that after April 8th, 2024, all labs will be drawn in the cancer center.  You do not have to check in or register with the main entrance as you have in the past but will complete your check-in in the cancer center.  Wear comfortable clothing and clothing appropriate for easy access to any Portacath or PICC line.   We strive to give you quality time with your provider. You may need to reschedule your appointment if you arrive late (15 or more minutes).  Arriving late affects you and other patients whose appointments are after yours.  Also, if you miss three or more appointments without notifying the office, you may be dismissed from the clinic at the provider's discretion.      For prescription refill requests, have your pharmacy contact our office and allow 72 hours for refills to be completed.    Today you received the following chemotherapy and/or immunotherapy agents D5 Vidaza      To help prevent nausea and vomiting after your treatment, we encourage you to take your nausea medication as directed.   Azacitidine Injection What is this medication? AZACITIDINE (ay za SITE i deen) treats blood and bone marrow cancers. It works by slowing down the growth of cancer cells. This medicine may be used for other purposes; ask your health care provider or pharmacist if you have questions. COMMON BRAND NAME(S): Vidaza What should I tell my care team before I take this medication? They need to know if you have any of these conditions: Kidney disease Liver disease Low blood cell levels, such as low white cells, platelets, or red blood cells Low levels of albumin in the blood Low levels of bicarbonate in the blood An unusual or allergic reaction to  azacitidine, mannitol, other medications, foods, dyes, or preservatives If you or your partner are pregnant or trying to get pregnant Breast-feeding How should I use this medication? This medication is injected into a vein or under the skin. It is given by your care team in a hospital or clinic setting. Talk to your care team about the use of this medication in children. While it may be prescribed for children as young as 1 month for selected conditions, precautions do apply. Overdosage: If you think you have taken too much of this medicine contact a poison control center or emergency room at once. NOTE: This medicine is only for you. Do not share this medicine with others. What if I miss a dose? Keep appointments for follow-up doses. It is important not to miss your dose. Call your care team if you are unable to keep an appointment. What may interact with this medication? Interactions are not expected. This list may not describe all possible interactions. Give your health care provider a list of all the medicines, herbs, non-prescription drugs, or dietary supplements you use. Also tell them if you smoke, drink alcohol, or use illegal drugs. Some items may interact with your medicine. What should I watch for while using this medication? Your condition will be monitored carefully while you are receiving this medication. This medication may make you feel generally unwell. This is not uncommon as chemotherapy can affect healthy cells as well as cancer cells. Report any side effects. Continue  your course of treatment even though you feel ill unless your care team tells you to stop. You may need blood work done while you are taking this medication. Other product types may be available that contain the medication azacitidine. The injection and oral products should not be used in place of one another. Talk to your care team if you have questions. This medication can cause serious side effects. To reduce  the risk, your care team may give you other medications to take before receiving this one. Be sure to follow the directions from your care team. This medication may increase your risk of getting an infection. Call your care team for advice if you get a fever, chills, sore throat, or other symptoms of a cold or flu. Do not treat yourself. Try to avoid being around people who are sick. Avoid taking medications that contain aspirin, acetaminophen, ibuprofen, naproxen, or ketoprofen unless instructed by your care team. These medications may hide a fever. Be careful brushing or flossing your teeth or using a toothpick because you may get an infection or bleed more easily. If you have any dental work done, tell your dentist you are receiving this medication. Talk to your care team if you or your partner may be pregnant. Serious birth defects can occur if you take this medication during pregnancy and for 6 months after the last dose. You will need a negative pregnancy test before starting this medication. Contraception is recommended while taking this medication and for 6 months after the last dose. Your care team can help you find the option that works for you. If your partner can get pregnant, use a condom during sex while taking this medication and for 3 months after the last dose. Do not breastfeed while taking this medication and for 1 week after the last dose. This medication may cause infertility. Talk to your care team if you are concerned about your fertility. What side effects may I notice from receiving this medication? Side effects that you should report to your care team as soon as possible: Allergic reactions--skin rash, itching, hives, swelling of the face, lips, tongue, or throat Infection--fever, chills, cough, sore throat, wounds that don't heal, pain or trouble when passing urine, general feeling of discomfort or being unwell Kidney injury--decrease in the amount of urine, swelling of the  ankles, hands, or feet Liver injury--right upper belly pain, loss of appetite, nausea, light-colored stool, dark yellow or brown urine, yellowing skin or eyes, unusual weakness or fatigue Low red blood cell level--unusual weakness or fatigue, dizziness, headache, trouble breathing Tumor lysis syndrome (TLS)--nausea, vomiting, diarrhea, decrease in the amount of urine, dark urine, unusual weakness or fatigue, confusion, muscle pain or cramps, fast or irregular heartbeat, joint pain Unusual bruising or bleeding Side effects that usually do not require medical attention (report to your care team if they continue or are bothersome): Constipation Diarrhea Nausea Pain, redness, or irritation at injection site Vomiting This list may not describe all possible side effects. Call your doctor for medical advice about side effects. You may report side effects to FDA at 1-800-FDA-1088. Where should I keep my medication? This medication is given in a hospital or clinic. It will not be stored at home. NOTE: This sheet is a summary. It may not cover all possible information. If you have questions about this medicine, talk to your doctor, pharmacist, or health care provider.  2024 Elsevier/Gold Standard (2023-03-07 00:00:00)  BELOW ARE SYMPTOMS THAT SHOULD BE REPORTED IMMEDIATELY: *FEVER GREATER THAN  100.4 F (38 C) OR HIGHER *CHILLS OR SWEATING *NAUSEA AND VOMITING THAT IS NOT CONTROLLED WITH YOUR NAUSEA MEDICATION *UNUSUAL SHORTNESS OF BREATH *UNUSUAL BRUISING OR BLEEDING *URINARY PROBLEMS (pain or burning when urinating, or frequent urination) *BOWEL PROBLEMS (unusual diarrhea, constipation, pain near the anus) TENDERNESS IN MOUTH AND THROAT WITH OR WITHOUT PRESENCE OF ULCERS (sore throat, sores in mouth, or a toothache) UNUSUAL RASH, SWELLING OR PAIN  UNUSUAL VAGINAL DISCHARGE OR ITCHING   Items with * indicate a potential emergency and should be followed up as soon as possible or go to the  Emergency Department if any problems should occur.  Please show the CHEMOTHERAPY ALERT CARD or IMMUNOTHERAPY ALERT CARD at check-in to the Emergency Department and triage nurse.  Should you have questions after your visit or need to cancel or reschedule your appointment, please contact [facility] [place] - A DEPT OF [facility]igh[facility] Bridegroom [facility] [phone]  and follow the prompts.  Office hours are 8:00 a.m. to 4:30 p.m. Monday - Friday. Please note that voicemails left after 4:00 p.m. may not be returned until the following business day.  We are closed weekends and major holidays. You have access to a nurse at all times for urgent questions. Please call the main number to the clinic [phone] and follow the prompts.  For any non-urgent questions, you may also contact your provider using MyChart. We now offer e-Visits for anyone 52 and older to request care online for non-urgent symptoms. For details visit [email or web address].   Also download the MyChart app! Go to the app store, search "MyChart", open the app, select [place], and log in with your MyChart username and password.

## 2023-10-21 NOTE — Progress Notes (Signed)
 Patient presents today for D5 Vidaza per provider's order. Vital signs stable and patient voiced no new complaints at this time.  Treatment given today per MD orders. Tolerated infusion without adverse affects. Vital signs stable. No complaints at this time. Discharged from clinic ambulatory in stable condition. Alert and oriented x 3. F/U with [facility] as scheduled.

## 2023-10-24 ENCOUNTER — Inpatient Hospital Stay

## 2023-10-24 VITALS — BP 107/56 | HR 65 | Temp 97.1°F | Resp 18

## 2023-10-24 DIAGNOSIS — C9310 Chronic myelomonocytic leukemia not having achieved remission: Secondary | ICD-10-CM | POA: Diagnosis not present

## 2023-10-24 DIAGNOSIS — D696 Thrombocytopenia, unspecified: Secondary | ICD-10-CM | POA: Diagnosis not present

## 2023-10-24 DIAGNOSIS — Z95828 Presence of other vascular implants and grafts: Secondary | ICD-10-CM

## 2023-10-24 DIAGNOSIS — Z79899 Other long term (current) drug therapy: Secondary | ICD-10-CM | POA: Diagnosis not present

## 2023-10-24 DIAGNOSIS — Z5111 Encounter for antineoplastic chemotherapy: Secondary | ICD-10-CM | POA: Diagnosis not present

## 2023-10-24 LAB — CBC WITH DIFFERENTIAL/PLATELET
Abs Immature Granulocytes: 0.1 10*3/uL — ABNORMAL HIGH (ref 0.00–0.07)
Basophils Absolute: 0 10*3/uL (ref 0.0–0.1)
Basophils Relative: 1 %
Eosinophils Absolute: 0 10*3/uL (ref 0.0–0.5)
Eosinophils Relative: 1 %
HCT: 27.3 % — ABNORMAL LOW (ref 36.0–46.0)
Hemoglobin: 8.2 g/dL — ABNORMAL LOW (ref 12.0–15.0)
Lymphocytes Relative: 21 %
Lymphs Abs: 0.4 10*3/uL — ABNORMAL LOW (ref 0.7–4.0)
MCH: 29.6 pg (ref 26.0–34.0)
MCHC: 30 g/dL (ref 30.0–36.0)
MCV: 98.6 fL (ref 80.0–100.0)
Metamyelocytes Relative: 3 %
Monocytes Absolute: 0.1 10*3/uL (ref 0.1–1.0)
Monocytes Relative: 5 %
Myelocytes: 4 %
Neutro Abs: 1.4 10*3/uL — ABNORMAL LOW (ref 1.7–7.7)
Neutrophils Relative %: 65 %
Platelets: 88 10*3/uL — ABNORMAL LOW (ref 150–400)
RBC: 2.77 MIL/uL — ABNORMAL LOW (ref 3.87–5.11)
RDW: 19.7 % — ABNORMAL HIGH (ref 11.5–15.5)
WBC: 2.1 10*3/uL — ABNORMAL LOW (ref 4.0–10.5)
nRBC: 2 /100{WBCs} — ABNORMAL HIGH
nRBC: 2.4 % — ABNORMAL HIGH (ref 0.0–0.2)

## 2023-10-24 LAB — COMPREHENSIVE METABOLIC PANEL WITH GFR
ALT: 11 U/L (ref 0–44)
AST: 24 U/L (ref 15–41)
Albumin: 3.9 g/dL (ref 3.5–5.0)
Alkaline Phosphatase: 51 U/L (ref 38–126)
Anion gap: 7 (ref 5–15)
BUN: 14 mg/dL (ref 8–23)
CO2: 28 mmol/L (ref 22–32)
Calcium: 9.4 mg/dL (ref 8.9–10.3)
Chloride: 102 mmol/L (ref 98–111)
Creatinine, Ser: 0.84 mg/dL (ref 0.44–1.00)
GFR, Estimated: >60 mL/min (ref >60)
Glucose, Bld: 115 mg/dL — ABNORMAL HIGH (ref 70–99)
Potassium: 3.9 mmol/L (ref 3.5–5.1)
Sodium: 137 mmol/L (ref 135–145)
Total Bilirubin: 1.1 mg/dL (ref 0.0–1.2)
Total Protein: 6.8 g/dL (ref 6.5–8.1)

## 2023-10-24 LAB — MAGNESIUM: Magnesium: 2.2 mg/dL (ref 1.7–2.4)

## 2023-10-24 MED ORDER — SODIUM CHLORIDE 0.9% FLUSH
10.0000 mL | Freq: Once | INTRAVENOUS | Status: AC
  Administered 2023-10-24: 10 mL via INTRAVENOUS

## 2023-10-24 MED ORDER — SODIUM CHLORIDE 0.9 % IV SOLN: Freq: Once | INTRAVENOUS | Status: AC

## 2023-10-24 MED ORDER — HEPARIN SOD (PORK) LOCK FLUSH 100 UNIT/ML IV SOLN
500.0000 [IU] | Freq: Once | INTRAVENOUS | Status: AC
Start: 2023-10-24 — End: 2023-10-24
  Administered 2023-10-24: 500 [IU] via INTRAVENOUS

## 2023-10-24 MED ORDER — PALONOSETRON HCL INJECTION 0.25 MG/5ML
0.2500 mg | Freq: Once | INTRAVENOUS | Status: AC
  Administered 2023-10-24: 0.25 mg via INTRAVENOUS
  Filled 2023-10-24: qty 5

## 2023-10-24 MED ORDER — SODIUM CHLORIDE 0.9 % IV SOLN
75.0000 mg/m2 | Freq: Once | INTRAVENOUS | Status: AC
  Administered 2023-10-24: 126 mg via INTRAVENOUS
  Filled 2023-10-24: qty 12.6

## 2023-10-24 MED FILL — Azacitidine For Inj 100 MG: INTRAMUSCULAR | Qty: 12.6 | Status: AC

## 2023-10-24 NOTE — Progress Notes (Signed)
 Do not need to wait for labs to report out to begin treatment.  V.O. Dr Carilyn Goodpasture, PharmD

## 2023-10-24 NOTE — Progress Notes (Signed)
 Patient presents today for D6 Vidaza per provider's order. Vital signs stable and patient voiced no new complaints at this time.   Treatment given today per MD orders. Tolerated infusion without adverse affects. Vital signs stable. No complaints at this time. Discharged from clinic ambulatory in stable condition. Alert and oriented x 3. F/U with [facility] as scheduled.

## 2023-10-24 NOTE — Patient Instructions (Signed)
 [facility] [place] - [facility]  Discharge Instructions: Thank you for choosing [name] to provide your oncology and hematology care.  If you have a lab appointment with the [name] - please note that after April 8th, 2024, all labs will be drawn in the cancer [name].  You do not have to check in or register with the main entrance as you have in the past but will complete your check-in in the cancer [name].  Wear comfortable clothing and clothing appropriate for easy access to any Portacath or PICC line.   We strive to give you quality time with your provider. You may need to reschedule your appointment if you arrive late (15 or more minutes).  Arriving late affects you and other patients whose appointments are after yours.  Also, if you miss three or more appointments without notifying the office, you may be dismissed from the clinic at the provider's discretion.      For prescription refill requests, have your pharmacy contact our office and allow 72 hours for refills to be completed.    Today you received the following chemotherapy and/or immunotherapy agents D6 Vidaza   To help prevent nausea and vomiting after your treatment, we encourage you to take your nausea medication as directed.  Azacitidine Injection What is this medication? AZACITIDINE (ay za SITE i deen) treats blood and bone marrow cancers. It works by slowing down the growth of cancer cells. This medicine may be used for other purposes; ask your health care provider or pharmacist if you have questions. COMMON BRAND NAME(S): Vidaza What should I tell my care team before I take this medication? They need to know if you have any of these conditions: Kidney disease Liver disease Low blood cell levels, such as low white cells, platelets, or red blood cells Low levels of albumin in the blood Low levels of bicarbonate in the blood An unusual or allergic reaction to  azacitidine, mannitol, other medications, foods, dyes, or preservatives If you or your partner are pregnant or trying to get pregnant Breast-feeding How should I use this medication? This medication is injected into a vein or under the skin. It is given by your care team in a hospital or clinic setting. Talk to your care team about the use of this medication in children. While it may be prescribed for children as young as 1 month for selected conditions, precautions do apply. Overdosage: If you think you have taken too much of this medicine contact a poison control [name] or emergency room at once. NOTE: This medicine is only for you. Do not share this medicine with others. What if I miss a dose? Keep appointments for follow-up doses. It is important not to miss your dose. Call your care team if you are unable to keep an appointment. What may interact with this medication? Interactions are not expected. This list may not describe all possible interactions. Give your health care provider a list of all the medicines, herbs, non-prescription drugs, or dietary supplements you use. Also tell them if you smoke, drink alcohol, or use illegal drugs. Some items may interact with your medicine. What should I watch for while using this medication? Your condition will be monitored carefully while you are receiving this medication. This medication may make you feel generally unwell. This is not uncommon as chemotherapy can affect healthy cells as well as cancer cells. Report any side effects. Continue your course of treatment  even though you feel ill unless your care team tells you to stop. You may need blood work done while you are taking this medication. Other product types may be available that contain the medication azacitidine. The injection and oral products should not be used in place of one another. Talk to your care team if you have questions. This medication can cause serious side effects. To reduce  the risk, your care team may give you other medications to take before receiving this one. Be sure to follow the directions from your care team. This medication may increase your risk of getting an infection. Call your care team for advice if you get a fever, chills, sore throat, or other symptoms of a cold or flu. Do not treat yourself. Try to avoid being around people who are sick. Avoid taking medications that contain aspirin, acetaminophen, ibuprofen, naproxen, or ketoprofen unless instructed by your care team. These medications may hide a fever. Be careful brushing or flossing your teeth or using a toothpick because you may get an infection or bleed more easily. If you have any dental work done, tell your dentist you are receiving this medication. Talk to your care team if you or your partner may be pregnant. Serious birth defects can occur if you take this medication during pregnancy and for 6 months after the last dose. You will need a negative pregnancy test before starting this medication. Contraception is recommended while taking this medication and for 6 months after the last dose. Your care team can help you find the option that works for you. If your partner can get pregnant, use a condom during sex while taking this medication and for 3 months after the last dose. Do not breastfeed while taking this medication and for 1 week after the last dose. This medication may cause infertility. Talk to your care team if you are concerned about your fertility. What side effects may I notice from receiving this medication? Side effects that you should report to your care team as soon as possible: Allergic reactions--skin rash, itching, hives, swelling of the face, lips, tongue, or throat Infection--fever, chills, cough, sore throat, wounds that don't heal, pain or trouble when passing urine, general feeling of discomfort or being unwell Kidney injury--decrease in the amount of urine, swelling of the  ankles, hands, or feet Liver injury--right upper belly pain, loss of appetite, nausea, light-colored stool, dark yellow or brown urine, yellowing skin or eyes, unusual weakness or fatigue Low red blood cell level--unusual weakness or fatigue, dizziness, headache, trouble breathing Tumor lysis syndrome (TLS)--nausea, vomiting, diarrhea, decrease in the amount of urine, dark urine, unusual weakness or fatigue, confusion, muscle pain or cramps, fast or irregular heartbeat, joint pain Unusual bruising or bleeding Side effects that usually do not require medical attention (report to your care team if they continue or are bothersome): Constipation Diarrhea Nausea Pain, redness, or irritation at injection site Vomiting This list may not describe all possible side effects. Call your doctor for medical advice about side effects. You may report side effects to FDA at 1-800-FDA-1088. Where should I keep my medication? This medication is given in a hospital or clinic. It will not be stored at home. NOTE: This sheet is a summary. It may not cover all possible information. If you have questions about this medicine, talk to your doctor, pharmacist, or health care provider.  2024 Elsevier/Gold Standard (2023-03-07 00:00:00)  BELOW ARE SYMPTOMS THAT SHOULD BE REPORTED IMMEDIATELY: *FEVER GREATER THAN 100.4 F (38 C)  OR HIGHER *CHILLS OR SWEATING *NAUSEA AND VOMITING THAT IS NOT CONTROLLED WITH YOUR NAUSEA MEDICATION *UNUSUAL SHORTNESS OF BREATH *UNUSUAL BRUISING OR BLEEDING *URINARY PROBLEMS (pain or burning when urinating, or frequent urination) *BOWEL PROBLEMS (unusual diarrhea, constipation, pain near the anus) TENDERNESS IN MOUTH AND THROAT WITH OR WITHOUT PRESENCE OF ULCERS (sore throat, sores in mouth, or a toothache) UNUSUAL RASH, SWELLING OR PAIN  UNUSUAL VAGINAL DISCHARGE OR ITCHING   Items with * indicate a potential emergency and should be followed up as soon as possible or go to the  Emergency Department if any problems should occur.  Please show the CHEMOTHERAPY ALERT CARD or IMMUNOTHERAPY ALERT CARD at check-in to the Emergency Department and triage nurse.  Should you have questions after your visit or need to cancel or reschedule your appointment, please contact [name] [place] - A DEPT OF [name] [phone]  and follow the prompts.  Office hours are 8:00 a.m. to 4:30 p.m. Monday - Friday. Please note that voicemails left after 4:00 p.m. may not be returned until the following business day.  We are closed weekends and major holidays. You have access to a nurse at all times for urgent questions. Please call the main number to the clinic [phone] and follow the prompts.  For any non-urgent questions, you may also contact your provider using MyChart. We now offer e-Visits for anyone 1 and older to request care online for non-urgent symptoms. For details visit [email or web address].   Also download the MyChart app! Go to the app store, search "MyChart", open the app, select [place], and log in with your MyChart username and password.

## 2023-10-25 ENCOUNTER — Inpatient Hospital Stay (HOSPITAL_BASED_OUTPATIENT_CLINIC_OR_DEPARTMENT_OTHER)

## 2023-10-25 VITALS — BP 123/66 | HR 70 | Temp 97.7°F | Resp 98

## 2023-10-25 DIAGNOSIS — C9310 Chronic myelomonocytic leukemia not having achieved remission: Secondary | ICD-10-CM | POA: Diagnosis not present

## 2023-10-25 DIAGNOSIS — Z79899 Other long term (current) drug therapy: Secondary | ICD-10-CM | POA: Diagnosis not present

## 2023-10-25 DIAGNOSIS — Z5111 Encounter for antineoplastic chemotherapy: Secondary | ICD-10-CM | POA: Diagnosis not present

## 2023-10-25 DIAGNOSIS — Z95828 Presence of other vascular implants and grafts: Secondary | ICD-10-CM

## 2023-10-25 DIAGNOSIS — D696 Thrombocytopenia, unspecified: Secondary | ICD-10-CM | POA: Diagnosis not present

## 2023-10-25 MED ORDER — SODIUM CHLORIDE 0.9 % IV SOLN
75.0000 mg/m2 | Freq: Once | INTRAVENOUS | Status: AC
  Administered 2023-10-25: 126 mg via INTRAVENOUS
  Filled 2023-10-25: qty 12.6

## 2023-10-25 MED ORDER — HEPARIN SOD (PORK) LOCK FLUSH 100 UNIT/ML IV SOLN
500.0000 [IU] | Freq: Once | INTRAVENOUS | Status: AC
Start: 2023-10-25 — End: 2023-10-25
  Administered 2023-10-25: 500 [IU] via INTRAVENOUS

## 2023-10-25 MED ORDER — SODIUM CHLORIDE 0.9 % IV SOLN
Freq: Once | INTRAVENOUS | Status: AC
Start: 2023-10-25 — End: 2023-10-25

## 2023-10-25 MED ORDER — SODIUM CHLORIDE 0.9% FLUSH
10.0000 mL | Freq: Once | INTRAVENOUS | Status: AC
  Administered 2023-10-25: 10 mL via INTRAVENOUS

## 2023-10-25 NOTE — Patient Instructions (Signed)
 [facility] [place] [facility]  Discharge Instructions: Thank you for choosing [facility] to provide your oncology and hematology care.  If you have a lab appointment with the Cancer Center - please note that after April 8th, 2024, all labs will be drawn in the cancer center.  You do not have to check in or register with the main entrance as you have in the past but will complete your check-in in the cancer center.  Wear comfortable clothing and clothing appropriate for easy access to any Portacath or PICC line.   We strive to give you quality time with your provider. You may need to reschedule your appointment if you arrive late (15 or more minutes).  Arriving late affects you and other patients whose appointments are after yours.  Also, if you miss three or more appointments without notifying the office, you may be dismissed from the clinic at the provider's discretion.      For prescription refill requests, have your pharmacy contact our office and allow 72 hours for refills to be completed.    Today you received the following chemotherapy and/or immunotherapy agents azacitadine   To help prevent nausea and vomiting after your treatment, we encourage you to take your nausea medication as directed.  BELOW ARE SYMPTOMS THAT SHOULD BE REPORTED IMMEDIATELY: *FEVER GREATER THAN 100.4 F (38 C) OR HIGHER *CHILLS OR SWEATING *NAUSEA AND VOMITING THAT IS NOT CONTROLLED WITH YOUR NAUSEA MEDICATION *UNUSUAL SHORTNESS OF BREATH *UNUSUAL BRUISING OR BLEEDING *URINARY PROBLEMS (pain or burning when urinating, or frequent urination) *BOWEL PROBLEMS (unusual diarrhea, constipation, pain near the anus) TENDERNESS IN MOUTH AND THROAT WITH OR WITHOUT PRESENCE OF ULCERS (sore throat, sores in mouth, or a toothache) UNUSUAL RASH, SWELLING OR PAIN  UNUSUAL VAGINAL DISCHARGE OR ITCHING   Items with * indicate a potential emergency and should be followed up  as soon as possible or go to the Emergency Department if any problems should occur.  Please show the CHEMOTHERAPY ALERT CARD or IMMUNOTHERAPY ALERT CARD at check-in to the Emergency Department and triage nurse.  Should you have questions after your visit or need to cancel or reschedule your appointment, please contact [facility] [place] - A DEPT OF [facility]ide[facility] [phone]  and follow the prompts.  Office hours are 8:00 a.m. to 4:30 p.m. Monday - Friday. Please note that voicemails left after 4:00 p.m. may not be returned until the following business day.  We are closed weekends and major holidays. You have access to a nurse at all times for urgent questions. Please call the main number to the clinic [phone] and follow the prompts.  For any non-urgent questions, you may also contact your provider using MyChart. We now offer e-Visits for anyone 46 and older to request care online for non-urgent symptoms. For details visit [email or web address].   Also download the MyChart app! Go to the app store, search "MyChart", open the app, select [place], and log in with your MyChart username and password.

## 2023-10-25 NOTE — Progress Notes (Signed)
Treatment given per orders. Patient tolerated it well without problems. Vitals stable and discharged home from clinic ambulatory. Follow up as scheduled.  

## 2023-10-26 DIAGNOSIS — H524 Presbyopia: Secondary | ICD-10-CM | POA: Diagnosis not present

## 2023-10-28 ENCOUNTER — Ambulatory Visit

## 2023-10-28 DIAGNOSIS — J4550 Severe persistent asthma, uncomplicated: Secondary | ICD-10-CM

## 2023-10-31 ENCOUNTER — Other Ambulatory Visit: Payer: Self-pay | Admitting: Allergy

## 2023-10-31 ENCOUNTER — Other Ambulatory Visit: Payer: Self-pay | Admitting: Hematology

## 2023-10-31 DIAGNOSIS — G4700 Insomnia, unspecified: Secondary | ICD-10-CM

## 2023-11-07 ENCOUNTER — Other Ambulatory Visit: Payer: Self-pay | Admitting: Allergy

## 2023-11-08 DIAGNOSIS — C9310 Chronic myelomonocytic leukemia not having achieved remission: Secondary | ICD-10-CM | POA: Diagnosis not present

## 2023-11-08 DIAGNOSIS — C9200 Acute myeloblastic leukemia, not having achieved remission: Secondary | ICD-10-CM | POA: Diagnosis not present

## 2023-11-14 ENCOUNTER — Inpatient Hospital Stay (HOSPITAL_BASED_OUTPATIENT_CLINIC_OR_DEPARTMENT_OTHER): Admitting: Hematology

## 2023-11-14 ENCOUNTER — Inpatient Hospital Stay

## 2023-11-14 ENCOUNTER — Encounter: Payer: Self-pay | Admitting: Hematology

## 2023-11-14 VITALS — BP 114/63 | HR 77 | Temp 96.6°F | Resp 18

## 2023-11-14 DIAGNOSIS — Z5111 Encounter for antineoplastic chemotherapy: Secondary | ICD-10-CM | POA: Diagnosis not present

## 2023-11-14 DIAGNOSIS — C9310 Chronic myelomonocytic leukemia not having achieved remission: Secondary | ICD-10-CM

## 2023-11-14 DIAGNOSIS — Z95828 Presence of other vascular implants and grafts: Secondary | ICD-10-CM | POA: Diagnosis not present

## 2023-11-14 DIAGNOSIS — D696 Thrombocytopenia, unspecified: Secondary | ICD-10-CM | POA: Diagnosis not present

## 2023-11-14 DIAGNOSIS — Z79899 Other long term (current) drug therapy: Secondary | ICD-10-CM | POA: Diagnosis not present

## 2023-11-14 LAB — CBC WITH DIFFERENTIAL/PLATELET
Abs Immature Granulocytes: 0.5 10*3/uL — ABNORMAL HIGH (ref 0.00–0.07)
Band Neutrophils: 3 %
Basophils Absolute: 0 10*3/uL (ref 0.0–0.1)
Basophils Relative: 0 %
Blasts: 1 %
Eosinophils Absolute: 0 10*3/uL (ref 0.0–0.5)
Eosinophils Relative: 1 %
HCT: 29.1 % — ABNORMAL LOW (ref 36.0–46.0)
Hemoglobin: 9 g/dL — ABNORMAL LOW (ref 12.0–15.0)
Lymphocytes Relative: 25 %
Lymphs Abs: 0.9 10*3/uL (ref 0.7–4.0)
MCH: 30.3 pg (ref 26.0–34.0)
MCHC: 30.9 g/dL (ref 30.0–36.0)
MCV: 98 fL (ref 80.0–100.0)
Metamyelocytes Relative: 4 %
Monocytes Absolute: 0 10*3/uL — ABNORMAL LOW (ref 0.1–1.0)
Monocytes Relative: 0 %
Myelocytes: 9 %
Neutro Abs: 2.1 10*3/uL (ref 1.7–7.7)
Neutrophils Relative %: 55 %
Platelets: 151 10*3/uL (ref 150–400)
Promyelocytes Relative: 2 %
RBC: 2.97 MIL/uL — ABNORMAL LOW (ref 3.87–5.11)
RDW: 20.1 % — ABNORMAL HIGH (ref 11.5–15.5)
WBC: 3.6 10*3/uL — ABNORMAL LOW (ref 4.0–10.5)
nRBC: 2.3 % — ABNORMAL HIGH (ref 0.0–0.2)

## 2023-11-14 LAB — COMPREHENSIVE METABOLIC PANEL WITH GFR
ALT: 12 U/L (ref 0–44)
AST: 29 U/L (ref 15–41)
Albumin: 4.3 g/dL (ref 3.5–5.0)
Alkaline Phosphatase: 56 U/L (ref 38–126)
Anion gap: 9 (ref 5–15)
BUN: 15 mg/dL (ref 8–23)
CO2: 25 mmol/L (ref 22–32)
Calcium: 9.3 mg/dL (ref 8.9–10.3)
Chloride: 102 mmol/L (ref 98–111)
Creatinine, Ser: 0.82 mg/dL (ref 0.44–1.00)
GFR, Estimated: >60 mL/min (ref >60)
Glucose, Bld: 90 mg/dL (ref 70–99)
Potassium: 4 mmol/L (ref 3.5–5.1)
Sodium: 136 mmol/L (ref 135–145)
Total Bilirubin: 1.3 mg/dL — ABNORMAL HIGH (ref 0.0–1.2)
Total Protein: 7.1 g/dL (ref 6.5–8.1)

## 2023-11-14 LAB — MAGNESIUM: Magnesium: 2 mg/dL (ref 1.7–2.4)

## 2023-11-14 LAB — LACTATE DEHYDROGENASE: LDH: 663 U/L — ABNORMAL HIGH (ref 98–192)

## 2023-11-14 MED ORDER — SODIUM CHLORIDE 0.9% FLUSH
10.0000 mL | Freq: Once | INTRAVENOUS | Status: AC
  Administered 2023-11-14: 10 mL via INTRAVENOUS

## 2023-11-14 MED ORDER — SODIUM CHLORIDE 0.9 % IV SOLN: Freq: Once | INTRAVENOUS | Status: AC

## 2023-11-14 MED ORDER — SODIUM CHLORIDE 0.9 % IV SOLN
75.0000 mg/m2 | Freq: Once | INTRAVENOUS | Status: AC
  Administered 2023-11-14: 126 mg via INTRAVENOUS
  Filled 2023-11-14: qty 12.6

## 2023-11-14 MED ORDER — SODIUM CHLORIDE 0.9% FLUSH
10.0000 mL | Freq: Once | INTRAVENOUS | Status: AC
  Administered 2023-11-14: 10 mL

## 2023-11-14 MED ORDER — PALONOSETRON HCL INJECTION 0.25 MG/5ML
0.2500 mg | Freq: Once | INTRAVENOUS | Status: AC
  Administered 2023-11-14: 0.25 mg via INTRAVENOUS
  Filled 2023-11-14: qty 5

## 2023-11-14 MED ORDER — HEPARIN SOD (PORK) LOCK FLUSH 100 UNIT/ML IV SOLN
500.0000 [IU] | Freq: Once | INTRAVENOUS | Status: AC
  Administered 2023-11-14: 500 [IU] via INTRAVENOUS

## 2023-11-14 NOTE — Patient Instructions (Signed)

## 2023-11-14 NOTE — Progress Notes (Signed)
 Patient has been examined by Dr. Ellin Saba. Vital signs and labs have been reviewed by MD - ANC, Creatinine, LFTs, hemoglobin, and platelets are within treatment parameters per M.D. - pt may proceed with treatment.  Primary RN and pharmacy notified.

## 2023-11-14 NOTE — Progress Notes (Signed)
 [facility] [address] [place] [address]    Clinic Day:  11/14/2023  Referring physician: Artemisa Bile, MD  Patient Care Team: Artemisa Bile, MD as PCP - General (Internal Medicine) Riley Cheadle Windsor Hatcher, MD as Attending Physician (Gastroenterology) Vernell Goldsmith, MD as Consulting Physician (Pulmonary Disease) Paulett Boros, MD as Medical Oncologist (Hematology)   ASSESSMENT & PLAN:   Assessment: 1.  Macrocytic anemia and thrombocytopenia: - Patient seen at the request of Dr. Hildy Lowers for abnormal CBC. - 04/17/2022: WBC 15.8, Hb-9.7, PLT-128 - 03/24/2022: WBC-8.1 (N-43%, L-17%, M-19%, B-2%), Hb-9.6, MCV-99, PLT-115 - Smear review: 13% metamyelocytes, 2% myelocytes, 2% blasts,  - 10/19/2021: WBC-5.3 (N-61, L-21, M-10%), Hb-11, MCV-96, PLT-97 - BMBX (05/25/2022): Hypercellular bone marrow with myeloid hyperplasia with dysgranulopoiesis, erythroid hypoplasia and megakaryocytic hyperplasia with dyspoiesis.  Chromosome analysis was 67, XX.  Bone marrow blasts less than 5%.  Peripheral blood blasts less than 2%. - Mayo molecular model risk stratification: Intermediate 2 risk with at least 2 points (decreased hemoglobin less than 10, circulating immature cells).  Intermediate 2 risk with median OS 31 months. - Serum EPO level 41. - NGS: Positive for CBL, MPL, SRSF2, TET 2, RAD21 - PET scan (07/08/2022): Splenomegaly (volume 510 mm) with normal metabolic activity.  Uniform increase in marrow metabolic activity related to patient's anemia.  No lymphadenopathy. - BMBX (07/16/2022): Hypercellular bone marrow (95-100%) with myeloid hyperplasia, atypical monocytes, increased blasts (16% overall).  Atypical monocytosis present 23% of total events, expressing HLA-DR, CD38, CD4 dim, CD11C, CD13, CD14, CD36, CD64 with aberrant coexpression of CD56 and CD7. - Cycle 1 of azacitidine  on 08/02/2022, cycle 2 on 08/30/2022.  Azacitidine  decreased to 5 days starting cycle 5 on 11/22/2022 - BMBX  (09/21/2022): 3% blasts, hypercellular marrow with markedly increased dyspoietic megakaryocytes, increased fibrosis - BMBX (11/16/2022): Hypercellular marrow (70-80%) with marked GIST [name] (, myelofibrosis and 3% blasts on a very limited sample. - BMBX (05/06/2023): Hypercellular marrow with 85% cellularity with fibrosis and 4% blasts approximately. - BMBX (07/08/2023): Hypercellular marrow (95%) with increased myelopoiesis, dysmegakrypoiesis, increased fibrosis and 5% blasts.  Cytogenetics 48, XX. - NGS (07/08/2023): MPL, SRSF2, RUNX1, TET2 - BMBX (09/13/2023): Hypercellular marrow (100%) with increased myelopoiesis and this megakaryocytes.  Moderate myelofibrosis, grade 2 of 3.  No increase in blasts (1%).   2.  Social/family history: - She lives at home by herself.  Son lives next door. - Does part-time work at [employer].  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  Non-smoker. - Maternal aunt had tumor in the breast, patient not certain if it is cancer.  2 maternal first cousins had lymphoma.  Mother had stomach cancer.    Plan: 1.  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 - She did not have any fevers or infections. - Labs today: LFTs normal with bilirubin 1.3.  LDH elevated at 663.  White count 3.6 with normal ANC.  Platelets are 151.  Hemoglobin is 9.0. - Recommend proceeding with cycle 16 today with azacitidine  75 mg/m x 7 days.  RTC 4 weeks for follow-up for next cycle.  She will see Dr. Ada Acres in 7 to 8 weeks for possible repeat biopsy.   2.  Folate deficiency: - Continue folic acid  tablet daily.   3.  Weight loss: - She is drinking boost/Ensure +1 to 2 cans/day.  She lost about 4 pounds since last visit.  Recommend  increasing boost/Ensure to 2 and half cans per day.   4.  Constipation: - She is not requiring MiraLAX and has good bowel movements.  Continue MiraLAX as needed.     Orders Placed This Encounter  Procedures   Comprehensive metabolic panel    Standing Status:   Future    Expected Date:   01/09/2024    Expiration Date:   01/08/2025   Magnesium    Standing Status:   Future    Expected Date:   01/09/2024    Expiration Date:   01/08/2025   Lactate dehydrogenase    Standing Status:   Future    Expected Date:   01/09/2024    Expiration Date:   01/08/2025   CMP (Cancer Center only)    Standing Status:   Future    Expected Date:   01/09/2024    Expiration Date:   01/08/2025   CBC with Differential    Standing Status:   Future    Expected Date:   01/09/2024    Expiration Date:   01/08/2025      Hurman Maiden R Teague,acting as a scribe for Paulett Boros, MD.,have documented all relevant documentation on the behalf of Paulett Boros, MD,as directed by  Paulett Boros, MD while in the presence of Paulett Boros, MD.  I, Paulett Boros MD, have reviewed the above documentation for accuracy and completeness, and I agree with the above.    Paulett Boros, MD   4/28/20251:39 PM  CHIEF COMPLAINT:   Diagnosis: CMML-2    Cancer Staging  No matching staging information was found for the patient.    Prior Therapy: none  Current Therapy:  Azacitidine  70 mg/m x 7 days every 28 days    HISTORY OF PRESENT ILLNESS:   Oncology History  CMML (chronic myelomonocytic leukemia) (HCC)  06/03/2022 Initial Diagnosis   CMML (chronic myelomonocytic leukemia) (HCC)   08/02/2022 -  Chemotherapy   Patient is on Treatment Plan : MYELODYSPLASIA  Azacitidine  IVPB D1-7 q28d        INTERVAL HISTORY:   Sharnell is a 73 y.o. female presenting to clinic today for follow up of CMML-2. She was last seen by me on 10/17/23.  Today, she states that she is doing well overall. Her appetite level is at 100%. Her energy level is at 100%.  PAST MEDICAL HISTORY:   Past Medical History: Past Medical History:  Diagnosis Date   Anxiety    Asthma    had  1 episode 1 year ago-no more problem   CHF (congestive heart failure) (HCC)    swelling of feet & ankles   CMML (chronic myelomonocytic leukemia) (HCC) 06/03/2022   Cough    Depression    OCD   GERD (gastroesophageal reflux disease)    severe   OCD (obsessive compulsive disorder)    Port-A-Cath in place 07/26/2022   Shortness of breath    with exertion   Yeast infection 04/09/2014    Surgical History: Past Surgical History:  Procedure Laterality Date   CARPAL TUNNEL RELEASE  03/22/2012   Procedure: CARPAL TUNNEL RELEASE;  Surgeon: Kemp Patter, MD;  Location: [facility];  Service: Orthopedics;  Laterality: Right;  right carpal tunnel release   COLONOSCOPY  06/19/2012   Dr. Rourk:normal rectum and colon    ESOPHAGOGASTRODUODENOSCOPY N/A 12/26/2015   Dr. Riley Cheadle: normal    IR IMAGING GUIDED PORT INSERTION  07/30/2022   TONSILLECTOMY     TUBAL LIGATION      Social  History: Social History   Socioeconomic History   Marital status: Widowed    Spouse name: Not on file   Number of children: 1   Years of education: Not on file   Highest education level: Not on file  Occupational History   Occupation: retired  Tobacco Use   Smoking status: Never    Passive exposure: Yes   Smokeless tobacco: Never  Vaping Use   Vaping status: Never Used  Substance and Sexual Activity   Alcohol use: No   Drug use: No   Sexual activity: Yes    Birth control/protection: Surgical    Comment: tubal  Other Topics Concern   Not on file  Social History Narrative   Not on file   Social Drivers of Health   Financial Resource Strain: Not on file  Food Insecurity: Not on file  Transportation Needs: Not on file  Physical Activity: Not on file  Stress: Not on file  Social Connections: Not on file  Intimate Partner Violence: Not on file    Family History: Family History  Problem Relation Age of Onset   Asthma Mother    Macular degeneration Mother    Hypertension Mother     Stomach cancer Mother 71   Alzheimer's disease Father    Other Sister        blood clots   Diabetes Maternal Grandfather    Diabetes Paternal Grandfather    Other Son        enlarged spleen   Lymphoma Cousin        x2, both dx in their 49s   Colon cancer Neg Hx     Current Medications:  Current Outpatient Medications:    albuterol  (VENTOLIN  HFA) 108 (90 Base) MCG/ACT inhaler, INHALE TWO PUFFS EVERY 4 HOURS AS NEEDED FOR WHEEZING OR FOR SHORTNESS OF BREATH, Disp: 18 g, Rfl: 1   albuterol  (VENTOLIN  HFA) 108 (90 Base) MCG/ACT inhaler, INHALE 2 PUFFS BY MOUTH EVERY 4 HOURS AS NEEDED FOR SHORTNESS OF BREATH OR WHEEZING., Disp: 18 g, Rfl: 1   azaCITIDine  5 mg/2 mLs in lactated ringers  infusion, Inject into the vein daily. Days 1-7 every 28 days, Disp: , Rfl:    azelastine  (ASTELIN ) 0.1 % nasal spray, SPRAY 1 TO 2 SPRAYS PER NOSTRIL TWICE DAILY., Disp: 30 mL, Rfl: 3   benzonatate  (TESSALON ) 200 MG capsule, Take 200 mg by mouth 3 (three) times daily as needed., Disp: , Rfl:    BREZTRI  AEROSPHERE 160-9-4.8 MCG/ACT AERO, Inhale 2 puffs into the lungs 2 (two) times daily., Disp: 10.7 g, Rfl: 5   cetirizine  (ZYRTEC ) 5 MG tablet, Take 1 tablet (5 mg total) by mouth daily., Disp: 30 tablet, Rfl: 0   ciclopirox  (PENLAC ) 8 % solution, APPLY TOPICALLY AT BEDTIME. APPLY OVER NAIL FOLD AND SURROUNDING SKIN. APPLY DAILY OVER PREVIOUS COAT. AFTER SEVEN DAYS, MAY REMOVE WITH ALCOHOL AND CONTINUE CYCLE., Disp: 6.6 mL, Rfl: 2   EPINEPHrine  0.3 mg/0.3 mL IJ SOAJ injection, Inject 0.3 mg into the muscle as needed for anaphylaxis., Disp: 2 each, Rfl: 1   famotidine  (PEPCID ) 20 MG tablet, Take 1 tablet (20 mg total) by mouth 2 (two) times daily., Disp: 60 tablet, Rfl: 5   FLUoxetine (PROZAC) 20 MG capsule, Take 1 capsule by mouth daily., Disp: , Rfl:    fluticasone  (FLONASE ) 50 MCG/ACT nasal spray, Place 2 sprays into both nostrils 2 (two) times daily., Disp: 16 g, Rfl: 5   folic acid  (FOLVITE ) 1 MG tablet, TAKE ONE  TABLET  BY MOUTH ONCE DAILY, Disp: 30 tablet, Rfl: 5   gabapentin (NEURONTIN) 300 MG capsule, , Disp: , Rfl:    HYDROcodone  bit-homatropine (HYCODAN) 5-1.5 MG/5ML syrup, , Disp: , Rfl:    ipratropium (ATROVENT ) 0.06 % nasal spray, USE TWO SPRAYS IN EACH NOSTRIL THREE TIMES DAILY AS NEEDED, Disp: 15 mL, Rfl: 5   lactulose  (CHRONULAC ) 10 GM/15ML solution, Take 15 mLs (10 g total) by mouth every 3 (three) hours as needed for mild constipation., Disp: 450 mL, Rfl: 2   lidocaine -prilocaine  (EMLA ) cream, Apply a quarter sized amount to port a cath site and cover with plastic wrap one hour prior to infusion appointments, Disp: 30 g, Rfl: 3   megestrol  (MEGACE ) 400 MG/10ML suspension, Take 10 mLs (400 mg total) by mouth 2 (two) times daily., Disp: 480 mL, Rfl: 2   montelukast  (SINGULAIR ) 10 MG tablet, Take 1 tablet (10 mg total) by mouth at bedtime., Disp: 30 tablet, Rfl: 5   NON FORMULARY, [facility]  Antifungal (nail)-#1, Disp: , Rfl:    pantoprazole  (PROTONIX ) 40 MG tablet, TAKE ONE TABLET BY MOUTH ONCE DAILY, Disp: 30 tablet, Rfl: 11   prochlorperazine  (COMPAZINE ) 10 MG tablet, Take 1 tablet (10 mg total) by mouth every 6 (six) hours as needed for nausea or vomiting., Disp: 30 tablet, Rfl: 0   Respiratory Therapy Supplies (FLUTTER) DEVI, Use as directed, Disp: 1 each, Rfl: 3   SF 5000 PLUS 1.1 % CREA dental cream, , Disp: , Rfl:    Spacer/Aero-Holding Chambers (AEROCHAMBER PLUS WITH MASK) inhaler, 1 each by Other route See admin instructions. Use with inhaler as instructed., Disp: 1 each, Rfl: 1   Tezepelumab -ekko (TEZSPIRE ) 210 MG/1. SOAJ, Inject 210 mg into the skin every 28 (twenty-eight) days., Disp: 1.91 mL, Rfl: 11   tiZANidine  (ZANAFLEX ) 2 MG tablet, Take 1 tablet (2 mg total) by mouth 2 (two) times daily as needed for muscle spasms., Disp: 15 tablet, Rfl: 0   traZODone  (DESYREL ) 50 MG tablet, TAKE ONE TABLET BY MOUTH AT BEDTIME, Disp: 30 tablet, Rfl: 3  Current Facility-Administered  Medications:    tezepelumab -ekko (TEZSPIRE ) 210 MG/1. syringe 210 mg, 210 mg, Subcutaneous, Q28 days, Brian Campanile, MD, 210 mg at 10/28/23 1052  Facility-Administered Medications Ordered in Other Visits:    0.9 %  sodium chloride  infusion, , Intravenous, Once, Linda Biehn, MD   azaCITIDine  (VIDAZA ) 126 mg in sodium chloride  0.9 % 50 mL chemo infusion, 75 mg/m2 (Treatment Plan Recorded), Intravenous, Once, Paulett Boros, MD   heparin  lock flush 100 unit/mL, 500 Units, Intravenous, Once, Paulett Boros, MD   palonosetron  (ALOXI ) injection 0.25 mg, 0.25 mg, Intravenous, Once, Paulett Boros, MD   sodium chloride  flush (NS) 0.9 % injection 10 mL, 10 mL, Intravenous, Once, Paulett Boros, MD   Allergies: Allergies  Allergen Reactions   Levonorgestrel-Ethinyl Estrad Cough   Sulfa Antibiotics Other (See Comments)    Unknown- pt unsure of reaction; believes it may be nausea   Sulfamethoxazole-Trimethoprim Other (See Comments)   Cephalosporins Other (See Comments)    Other Reaction: Toxicity    REVIEW OF SYSTEMS:   Review of Systems  Constitutional:  Negative for chills, fatigue and fever.  HENT:   Negative for lump/mass, mouth sores, nosebleeds, sore throat and trouble swallowing.   Eyes:  Negative for eye problems.  Respiratory:  Positive for cough. Negative for shortness of breath.   Cardiovascular:  Negative for chest pain, leg swelling and palpitations.  Gastrointestinal:  Negative for abdominal pain, constipation, diarrhea,  nausea and vomiting.  Genitourinary:  Negative for bladder incontinence, difficulty urinating, dysuria, frequency, hematuria and nocturia.   Musculoskeletal:  Positive for back pain. Negative for arthralgias, flank pain, myalgias and neck pain.  Skin:  Negative for itching and rash.  Neurological:  Negative for dizziness, headaches and numbness.  Hematological:  Does not bruise/bleed easily.   Psychiatric/Behavioral:  Negative for depression, sleep disturbance and suicidal ideas. The patient is not nervous/anxious.   All other systems reviewed and are negative.    VITALS:   There were no vitals taken for this visit.  Wt Readings from Last 3 Encounters:  11/14/23 125 lb (56.7 kg)  10/24/23 129 lb 6.4 oz (58.7 kg)  10/17/23 128 lb 1.6 oz (58.1 kg)    There is no height or weight on file to calculate BMI.  Performance status (ECOG): 1 - Symptomatic but completely ambulatory  PHYSICAL EXAM:   Physical Exam Vitals and nursing note reviewed. Exam conducted with a chaperone present.  Constitutional:      Appearance: Normal appearance.  Cardiovascular:     Rate and Rhythm: Normal rate and regular rhythm.     Pulses: Normal pulses.     Heart sounds: Normal heart sounds.  Pulmonary:     Effort: Pulmonary effort is normal.     Breath sounds: Normal breath sounds.  Abdominal:     Palpations: Abdomen is soft. There is no hepatomegaly, splenomegaly or mass.     Tenderness: There is no abdominal tenderness.  Musculoskeletal:     Right lower leg: No edema.     Left lower leg: No edema.  Lymphadenopathy:     Cervical: No cervical adenopathy.     Right cervical: No superficial, deep or posterior cervical adenopathy.    Left cervical: No superficial, deep or posterior cervical adenopathy.     Upper Body:     Right upper body: No supraclavicular or axillary adenopathy.     Left upper body: No supraclavicular or axillary adenopathy.  Neurological:     General: No focal deficit present.     Mental Status: She is alert and oriented to person, place, and time.  Psychiatric:        Mood and Affect: Mood normal.        Behavior: Behavior normal.     LABS:   CBC     Component Value Date/Time   WBC 3.6 (L) 11/14/2023 1223   RBC 2.97 (L) 11/14/2023 1223   HGB 9.0 (L) 11/14/2023 1223   HGB 13.1 07/23/2020 1112   HCT 29.1 (L) 11/14/2023 1223   HCT 40.7 07/23/2020 1112    PLT 151 11/14/2023 1223   MCV 98.0 11/14/2023 1223   MCV 95 07/23/2020 1112   MCH 30.3 11/14/2023 1223   MCHC 30.9 11/14/2023 1223   RDW 20.1 (H) 11/14/2023 1223   RDW 14.2 07/23/2020 1112   LYMPHSABS 0.9 11/14/2023 1223   LYMPHSABS 0.7 07/23/2020 1112   MONOABS 0.0 (L) 11/14/2023 1223   EOSABS 0.0 11/14/2023 1223   EOSABS 0.1 07/23/2020 1112   BASOSABS 0.0 11/14/2023 1223   BASOSABS 0.0 07/23/2020 1112    CMP      Component Value Date/Time   NA 136 11/14/2023 1222   K 4.0 11/14/2023 1222   CL 102 11/14/2023 1222   CO2 25 11/14/2023 1222   GLUCOSE 90 11/14/2023 1222   BUN 15 11/14/2023 1222   CREATININE 0.82 11/14/2023 1222   CALCIUM 9.3 11/14/2023 1222   PROT 7.1 11/14/2023 1222  ALBUMIN 4.3 11/14/2023 1222   AST 29 11/14/2023 1222   ALT 12 11/14/2023 1222   ALKPHOS 56 11/14/2023 1222   BILITOT 1.3 (H) 11/14/2023 1222   GFRNONAA >60 11/14/2023 1222   GFRAA >60 02/24/2019 1415     No results found for: "CEA1", "CEA" / No results found for: "CEA1", "CEA" No results found for: "PSA1" No results found for: "CAN199" No results found for: "CAN125"  Lab Results  Component Value Date   TOTALPROTELP 6.3 04/26/2022   ALBUMINELP 3.8 04/26/2022   A1GS 0.3 04/26/2022   A2GS 0.5 04/26/2022   BETS 0.8 04/26/2022   GAMS 0.9 04/26/2022   MSPIKE Not Observed 04/26/2022   SPEI Comment 04/26/2022   Lab Results  Component Value Date   TIBC 343 04/26/2022   FERRITIN 114 04/26/2022   IRONPCTSAT 25 04/26/2022   Lab Results  Component Value Date   LDH 663 (H) 11/14/2023   LDH 627 (H) 10/17/2023   LDH 482 (H) 09/19/2023     STUDIES:   No results found.

## 2023-11-14 NOTE — Progress Notes (Signed)
Patient presents today for D1 Vidaza infusion. Patient is in satisfactory condition with no new complaints voiced.  Vital signs are stable.  Labs reviewed by Dr. Ellin Saba during the office visit and all labs are within treatment parameters.  We will proceed with treatment per MD orders.   Treatment given today per MD orders. Tolerated infusion without adverse affects. Vital signs stable. No complaints at this time. Discharged from clinic ambulatory in stable condition. Alert and oriented x 3. F/U with [facility] as scheduled.

## 2023-11-14 NOTE — Patient Instructions (Signed)
 [facility] - A DEPT OF [facility]  Discharge Instructions: Thank you for choosing [facility] to provide your oncology and hematology care.  If you have a lab appointment with the Cancer Center - please note that after April 8th, 2024, all labs will be drawn in the cancer center.  You do not have to check in or register with the main entrance as you have in the past but will complete your check-in in the cancer center.  Wear comfortable clothing and clothing appropriate for easy access to any Portacath or PICC line.   We strive to give you quality time with your provider. You may need to reschedule your appointment if you arrive late (15 or more minutes).  Arriving late affects you and other patients whose appointments are after yours.  Also, if you miss three or more appointments without notifying the office, you may be dismissed from the clinic at the provider's discretion.      For prescription refill requests, have your pharmacy contact our office and allow 72 hours for refills to be completed.    Today you received D1 Vidaza   Azacitidine  Injection What is this medication? AZACITIDINE  (ay [employer] i deen) treats blood and bone marrow cancers. It works by slowing down the growth of cancer cells. This medicine may be used for other purposes; ask your health care provider or pharmacist if you have questions. COMMON BRAND NAME(S): Vidaza  What should I tell my care team before I take this medication? They need to know if you have any of these conditions: Kidney disease Liver disease Low blood cell levels, such as low white cells, platelets, or red blood cells Low levels of albumin in the blood Low levels of bicarbonate in the blood An unusual or allergic reaction to azacitidine , mannitol, other medications, foods, dyes, or preservatives If you or your partner are pregnant or trying to get pregnant Breast-feeding How should I use this  medication? This medication is injected into a vein or under the skin. It is given by your care team in a hospital or clinic setting. Talk to your care team about the use of this medication in children. While it may be prescribed for children as young as 1 month for selected conditions, precautions do apply. Overdosage: If you think you have taken too much of this medicine contact a poison control center or emergency room at once. NOTE: This medicine is only for you. Do not share this medicine with others. What if I miss a dose? Keep appointments for follow-up doses. It is important not to miss your dose. Call your care team if you are unable to keep an appointment. What may interact with this medication? Interactions are not expected. This list may not describe all possible interactions. Give your health care provider a list of all the medicines, herbs, non-prescription drugs, or dietary supplements you use. Also tell them if you smoke, drink alcohol, or use illegal drugs. Some items may interact with your medicine. What should I watch for while using this medication? Your condition will be monitored carefully while you are receiving this medication. This medication may make you feel generally unwell. This is not uncommon as chemotherapy can affect healthy cells as well as cancer cells. Report any side effects. Continue your course of treatment even though you feel ill unless your care team tells you to stop. You may need blood work done while you are taking this medication. Other product  types may be available that contain the medication azacitidine . The injection and oral products should not be used in place of one another. Talk to your care team if you have questions. This medication can cause serious side effects. To reduce the risk, your care team may give you other medications to take before receiving this one. Be sure to follow the directions from your care team. This medication may increase  your risk of getting an infection. Call your care team for advice if you get a fever, chills, sore throat, or other symptoms of a cold or flu. Do not treat yourself. Try to avoid being around people who are sick. Avoid taking medications that contain aspirin, acetaminophen , ibuprofen, naproxen , or ketoprofen unless instructed by your care team. These medications may hide a fever. Be careful brushing or flossing your teeth or using a toothpick because you may get an infection or bleed more easily. If you have any dental work done, tell your dentist you are receiving this medication. Talk to your care team if you or your partner may be pregnant. Serious birth defects can occur if you take this medication during pregnancy and for 6 months after the last dose. You will need a negative pregnancy test before starting this medication. Contraception is recommended while taking this medication and for 6 months after the last dose. Your care team can help you find the option that works for you. If your partner can get pregnant, use a condom during sex while taking this medication and for 3 months after the last dose. Do not breastfeed while taking this medication and for 1 week after the last dose. This medication may cause infertility. Talk to your care team if you are concerned about your fertility. What side effects may I notice from receiving this medication? Side effects that you should report to your care team as soon as possible: Allergic reactions--skin rash, itching, hives, swelling of the face, lips, tongue, or throat Infection--fever, chills, cough, sore throat, wounds that don't heal, pain or trouble when passing urine, general feeling of discomfort or being unwell Kidney injury--decrease in the amount of urine, swelling of the ankles, hands, or feet Liver injury--right upper belly pain, loss of appetite, nausea, light-colored stool, dark yellow or brown urine, yellowing skin or eyes, unusual weakness  or fatigue Low red blood cell level--unusual weakness or fatigue, dizziness, headache, trouble breathing Tumor lysis syndrome (TLS)--nausea, vomiting, diarrhea, decrease in the amount of urine, dark urine, unusual weakness or fatigue, confusion, muscle pain or cramps, fast or irregular heartbeat, joint pain Unusual bruising or bleeding Side effects that usually do not require medical attention (report to your care team if they continue or are bothersome): Constipation Diarrhea Nausea Pain, redness, or irritation at injection site Vomiting This list may not describe all possible side effects. Call your doctor for medical advice about side effects. You may report side effects to FDA at 1-800-FDA-1088. Where should I keep my medication? This medication is given in a hospital or clinic. It will not be stored at home. NOTE: This sheet is a summary. It may not cover all possible information. If you have questions about this medicine, talk to your doctor, pharmacist, or health care provider.  2024 Elsevier/Gold Standard (2023-03-07 00:00:00)     BELOW ARE SYMPTOMS THAT SHOULD BE REPORTED IMMEDIATELY: *FEVER GREATER THAN 100.4 F (38 C) OR HIGHER *CHILLS OR SWEATING *NAUSEA AND VOMITING THAT IS NOT CONTROLLED WITH YOUR NAUSEA MEDICATION *UNUSUAL SHORTNESS OF BREATH *UNUSUAL BRUISING OR BLEEDING *  URINARY PROBLEMS (pain or burning when urinating, or frequent urination) *BOWEL PROBLEMS (unusual diarrhea, constipation, pain near the anus) TENDERNESS IN MOUTH AND THROAT WITH OR WITHOUT PRESENCE OF ULCERS (sore throat, sores in mouth, or a toothache) UNUSUAL RASH, SWELLING OR PAIN  UNUSUAL VAGINAL DISCHARGE OR ITCHING   Items with * indicate a potential emergency and should be followed up as soon as possible or go to the Emergency Department if any problems should occur.  Please show the CHEMOTHERAPY ALERT CARD or IMMUNOTHERAPY ALERT CARD at check-in to the Emergency Department and triage  nurse.  Should you have questions after your visit or need to cancel or reschedule your appointment, please contact [facility] - A DEPT OF [facility] [phone]  and follow the prompts.  Office hours are 8:00 a.m. to 4:30 p.m. Monday - Friday. Please note that voicemails left after 4:00 p.m. may not be returned until the following business day.  We are closed weekends and major holidays. You have access to a nurse at all times for urgent questions. Please call the main number to the clinic [phone] and follow the prompts.  For any non-urgent questions, you may also contact your provider using MyChart. We now offer e-Visits for anyone 40 and older to request care online for non-urgent symptoms. For details visit [email or web address].   Also download the MyChart app! Go to the app store, search "MyChart", open the app, select [place], and log in with your MyChart username and password.

## 2023-11-15 ENCOUNTER — Inpatient Hospital Stay

## 2023-11-15 ENCOUNTER — Other Ambulatory Visit: Payer: Self-pay

## 2023-11-15 VITALS — BP 111/62 | HR 73 | Temp 97.7°F | Resp 18

## 2023-11-15 DIAGNOSIS — Z5111 Encounter for antineoplastic chemotherapy: Secondary | ICD-10-CM | POA: Diagnosis not present

## 2023-11-15 DIAGNOSIS — D696 Thrombocytopenia, unspecified: Secondary | ICD-10-CM | POA: Diagnosis not present

## 2023-11-15 DIAGNOSIS — C9310 Chronic myelomonocytic leukemia not having achieved remission: Secondary | ICD-10-CM

## 2023-11-15 DIAGNOSIS — Z95828 Presence of other vascular implants and grafts: Secondary | ICD-10-CM

## 2023-11-15 DIAGNOSIS — R634 Abnormal weight loss: Secondary | ICD-10-CM

## 2023-11-15 DIAGNOSIS — Z79899 Other long term (current) drug therapy: Secondary | ICD-10-CM | POA: Diagnosis not present

## 2023-11-15 MED ORDER — SODIUM CHLORIDE 0.9% FLUSH
10.0000 mL | Freq: Once | INTRAVENOUS | Status: AC
  Administered 2023-11-15: 10 mL via INTRAVENOUS

## 2023-11-15 MED ORDER — HEPARIN SOD (PORK) LOCK FLUSH 100 UNIT/ML IV SOLN
500.0000 [IU] | Freq: Once | INTRAVENOUS | Status: AC
  Administered 2023-11-15: 500 [IU] via INTRAVENOUS

## 2023-11-15 MED ORDER — SODIUM CHLORIDE 0.9 % IV SOLN
Freq: Once | INTRAVENOUS | Status: AC
Start: 2023-11-15 — End: 2023-11-15

## 2023-11-15 MED ORDER — SODIUM CHLORIDE 0.9 % IV SOLN
75.0000 mg/m2 | Freq: Once | INTRAVENOUS | Status: AC
  Administered 2023-11-15: 126 mg via INTRAVENOUS
  Filled 2023-11-15: qty 12.6

## 2023-11-15 NOTE — Progress Notes (Signed)
Patient tolerated chemotherapy with no complaints voiced. Side effects with management reviewed understanding verbalized. Port site clean and dry with no bruising or swelling noted at site. Good blood return noted before and after administration of chemotherapy.  Patient left in satisfactory condition with VSS and no s/s of distress noted. 

## 2023-11-15 NOTE — Patient Instructions (Signed)
 [facility] - A DEPT OF [facility]  Discharge Instructions: Thank you for choosing [facility] to provide your oncology and hematology care.  If you have a lab appointment with the Cancer Center - please note that after April 8th, 2024, all labs will be drawn in the cancer center.  You do not have to check in or register with the main entrance as you have in the past but will complete your check-in in the cancer center.  Wear comfortable clothing and clothing appropriate for easy access to any Portacath or PICC line.   We strive to give you quality time with your provider. You may need to reschedule your appointment if you arrive late (15 or more minutes).  Arriving late affects you and other patients whose appointments are after yours.  Also, if you miss three or more appointments without notifying the office, you may be dismissed from the clinic at the provider's discretion.      For prescription refill requests, have your pharmacy contact our office and allow 72 hours for refills to be completed.    Today you received the following chemotherapy and/or immunotherapy agents Vidaza, return as scheduled.   To help prevent nausea and vomiting after your treatment, we encourage you to take your nausea medication as directed.  BELOW ARE SYMPTOMS THAT SHOULD BE REPORTED IMMEDIATELY: *FEVER GREATER THAN 100.4 F (38 C) OR HIGHER *CHILLS OR SWEATING *NAUSEA AND VOMITING THAT IS NOT CONTROLLED WITH YOUR NAUSEA MEDICATION *UNUSUAL SHORTNESS OF BREATH *UNUSUAL BRUISING OR BLEEDING *URINARY PROBLEMS (pain or burning when urinating, or frequent urination) *BOWEL PROBLEMS (unusual diarrhea, constipation, pain near the anus) TENDERNESS IN MOUTH AND THROAT WITH OR WITHOUT PRESENCE OF ULCERS (sore throat, sores in mouth, or a toothache) UNUSUAL RASH, SWELLING OR PAIN  UNUSUAL VAGINAL DISCHARGE OR ITCHING   Items with * indicate a potential emergency and  should be followed up as soon as possible or go to the Emergency Department if any problems should occur.  Please show the CHEMOTHERAPY ALERT CARD or IMMUNOTHERAPY ALERT CARD at check-in to the Emergency Department and triage nurse.  Should you have questions after your visit or need to cancel or reschedule your appointment, please contact [facility]  - A DEPT OF [facility] [phone]  and follow the prompts.  Office hours are 8:00 a.m. to 4:30 p.m. Monday - Friday. Please note that voicemails left after 4:00 p.m. may not be returned until the following business day.  We are closed weekends and major holidays. You have access to a nurse at all times for urgent questions. Please call the main number to the clinic [phone] and follow the prompts.  For any non-urgent questions, you may also contact your provider using MyChart. We now offer e-Visits for anyone 68 and older to request care online for non-urgent symptoms. For details visit [email or web address].   Also download the MyChart app! Go to the app store, search "MyChart", open the app, select [place], and log in with your MyChart username and password.

## 2023-11-16 ENCOUNTER — Inpatient Hospital Stay

## 2023-11-16 VITALS — BP 101/69 | HR 80 | Temp 98.8°F | Resp 18

## 2023-11-16 DIAGNOSIS — Z5111 Encounter for antineoplastic chemotherapy: Secondary | ICD-10-CM | POA: Diagnosis not present

## 2023-11-16 DIAGNOSIS — C9310 Chronic myelomonocytic leukemia not having achieved remission: Secondary | ICD-10-CM

## 2023-11-16 DIAGNOSIS — Z95828 Presence of other vascular implants and grafts: Secondary | ICD-10-CM

## 2023-11-16 DIAGNOSIS — Z79899 Other long term (current) drug therapy: Secondary | ICD-10-CM | POA: Diagnosis not present

## 2023-11-16 DIAGNOSIS — D696 Thrombocytopenia, unspecified: Secondary | ICD-10-CM | POA: Diagnosis not present

## 2023-11-16 MED ORDER — SODIUM CHLORIDE 0.9 % IV SOLN: Freq: Once | INTRAVENOUS | Status: AC

## 2023-11-16 MED ORDER — SODIUM CHLORIDE FLUSH 0.9 % IV SOLN
10.0000 mL | Freq: Once | INTRAVENOUS | Status: AC
  Administered 2023-11-16: 10 mL via INTRAVENOUS
  Filled 2023-11-16: qty 10

## 2023-11-16 MED ORDER — SODIUM CHLORIDE 0.9 % IV SOLN
75.0000 mg/m2 | Freq: Once | INTRAVENOUS | Status: AC
  Administered 2023-11-16: 126 mg via INTRAVENOUS
  Filled 2023-11-16: qty 12.6

## 2023-11-16 MED ORDER — PALONOSETRON HCL INJECTION 0.25 MG/5ML
0.2500 mg | Freq: Once | INTRAVENOUS | Status: AC
  Administered 2023-11-16: 0.25 mg via INTRAVENOUS
  Filled 2023-11-16: qty 5

## 2023-11-16 MED ORDER — HEPARIN SOD (PORK) LOCK FLUSH 100 UNIT/ML IV SOLN
500.0000 [IU] | Freq: Once | INTRAVENOUS | Status: AC
  Administered 2023-11-16: 500 [IU] via INTRAVENOUS

## 2023-11-16 NOTE — Progress Notes (Signed)
 Patient presents today for vidaza  infusion per providers order.  Vital signs WNL.  Patient has no new complaints at this time.    Treatment given today per MD orders.  Vidaza  infusion without adverse affects.  Vital signs stable.  No complaints at this time.  Discharge from clinic ambulatory in stable condition.  Alert and oriented X 3.  Follow up with [facility] as scheduled.

## 2023-11-16 NOTE — Patient Instructions (Signed)
 [facility] [place] [facility]  Discharge Instructions: Thank you for choosing [facility] to provide your oncology and hematology care.  If you have a lab appointment with the Cancer Center - please note that after April 8th, 2024, all labs will be drawn in the cancer center.  You do not have to check in or register with the main entrance as you have in the past but will complete your check-in in the cancer center.  Wear comfortable clothing and clothing appropriate for easy access to any Portacath or PICC line.   We strive to give you quality time with your provider. You may need to reschedule your appointment if you arrive late (15 or more minutes).  Arriving late affects you and other patients whose appointments are after yours.  Also, if you miss three or more appointments without notifying the office, you may be dismissed from the clinic at the provider's discretion.      For prescription refill requests, have your pharmacy contact our office and allow 72 hours for refills to be completed.    Today you received the following chemotherapy and/or immunotherapy agents Vidaza      To help prevent nausea and vomiting after your treatment, we encourage you to take your nausea medication as directed.  BELOW ARE SYMPTOMS THAT SHOULD BE REPORTED IMMEDIATELY: *FEVER GREATER THAN 100.4 F (38 C) OR HIGHER *CHILLS OR SWEATING *NAUSEA AND VOMITING THAT IS NOT CONTROLLED WITH YOUR NAUSEA MEDICATION *UNUSUAL SHORTNESS OF BREATH *UNUSUAL BRUISING OR BLEEDING *URINARY PROBLEMS (pain or burning when urinating, or frequent urination) *BOWEL PROBLEMS (unusual diarrhea, constipation, pain near the anus) TENDERNESS IN MOUTH AND THROAT WITH OR WITHOUT PRESENCE OF ULCERS (sore throat, sores in mouth, or a toothache) UNUSUAL RASH, SWELLING OR PAIN  UNUSUAL VAGINAL DISCHARGE OR ITCHING   Items with * indicate a potential emergency and should be followed up as  soon as possible or go to the Emergency Department if any problems should occur.  Please show the CHEMOTHERAPY ALERT CARD or IMMUNOTHERAPY ALERT CARD at check-in to the Emergency Department and triage nurse.  Should you have questions after your visit or need to cancel or reschedule your appointment, please contact [facility] [place] - A DEPT OF Eligha Bridegroom [facility] [phone]  and follow the prompts.  Office hours are 8:00 a.m. to 4:30 p.m. Monday - Friday. Please note that voicemails left after 4:00 p.m. may not be returned until the following business day.  We are closed weekends and major holidays. You have access to a nurse at all times for urgent questions. Please call the main number to the clinic [phone] and follow the prompts.  For any non-urgent questions, you may also contact your provider using MyChart. We now offer e-Visits for anyone 72 and older to request care online for non-urgent symptoms. For details visit [email or web address].   Also download the MyChart app! Go to the app store, search "MyChart", open the app, select [place], and log in with your MyChart username and password.

## 2023-11-17 ENCOUNTER — Inpatient Hospital Stay: Attending: Hematology

## 2023-11-17 VITALS — BP 102/73 | HR 85 | Temp 98.1°F | Resp 20

## 2023-11-17 DIAGNOSIS — C9310 Chronic myelomonocytic leukemia not having achieved remission: Secondary | ICD-10-CM | POA: Diagnosis not present

## 2023-11-17 DIAGNOSIS — Z9089 Acquired absence of other organs: Secondary | ICD-10-CM | POA: Diagnosis not present

## 2023-11-17 DIAGNOSIS — Z881 Allergy status to other antibiotic agents status: Secondary | ICD-10-CM | POA: Insufficient documentation

## 2023-11-17 DIAGNOSIS — Z833 Family history of diabetes mellitus: Secondary | ICD-10-CM | POA: Insufficient documentation

## 2023-11-17 DIAGNOSIS — Z807 Family history of other malignant neoplasms of lymphoid, hematopoietic and related tissues: Secondary | ICD-10-CM | POA: Insufficient documentation

## 2023-11-17 DIAGNOSIS — D7581 Myelofibrosis: Secondary | ICD-10-CM | POA: Insufficient documentation

## 2023-11-17 DIAGNOSIS — Z5111 Encounter for antineoplastic chemotherapy: Secondary | ICD-10-CM | POA: Diagnosis not present

## 2023-11-17 DIAGNOSIS — Z818 Family history of other mental and behavioral disorders: Secondary | ICD-10-CM | POA: Insufficient documentation

## 2023-11-17 DIAGNOSIS — Z8249 Family history of ischemic heart disease and other diseases of the circulatory system: Secondary | ICD-10-CM | POA: Diagnosis not present

## 2023-11-17 DIAGNOSIS — Z79899 Other long term (current) drug therapy: Secondary | ICD-10-CM | POA: Diagnosis not present

## 2023-11-17 DIAGNOSIS — Z825 Family history of asthma and other chronic lower respiratory diseases: Secondary | ICD-10-CM | POA: Diagnosis not present

## 2023-11-17 DIAGNOSIS — Z882 Allergy status to sulfonamides status: Secondary | ICD-10-CM | POA: Insufficient documentation

## 2023-11-17 DIAGNOSIS — Z95828 Presence of other vascular implants and grafts: Secondary | ICD-10-CM

## 2023-11-17 DIAGNOSIS — Z800 Family history of malignant neoplasm of digestive organs: Secondary | ICD-10-CM | POA: Insufficient documentation

## 2023-11-17 DIAGNOSIS — E538 Deficiency of other specified B group vitamins: Secondary | ICD-10-CM | POA: Insufficient documentation

## 2023-11-17 DIAGNOSIS — Z8379 Family history of other diseases of the digestive system: Secondary | ICD-10-CM | POA: Diagnosis not present

## 2023-11-17 DIAGNOSIS — D696 Thrombocytopenia, unspecified: Secondary | ICD-10-CM | POA: Insufficient documentation

## 2023-11-17 DIAGNOSIS — Z832 Family history of diseases of the blood and blood-forming organs and certain disorders involving the immune mechanism: Secondary | ICD-10-CM | POA: Insufficient documentation

## 2023-11-17 DIAGNOSIS — K5900 Constipation, unspecified: Secondary | ICD-10-CM | POA: Insufficient documentation

## 2023-11-17 MED ORDER — SODIUM CHLORIDE 0.9 % IV SOLN: Freq: Once | INTRAVENOUS | Status: AC

## 2023-11-17 MED ORDER — SODIUM CHLORIDE 0.9% FLUSH
10.0000 mL | Freq: Once | INTRAVENOUS | Status: AC
  Administered 2023-11-17: 10 mL via INTRAVENOUS

## 2023-11-17 MED ORDER — SODIUM CHLORIDE 0.9 % IV SOLN
75.0000 mg/m2 | Freq: Once | INTRAVENOUS | Status: AC
  Administered 2023-11-17: 126 mg via INTRAVENOUS
  Filled 2023-11-17: qty 12.6

## 2023-11-17 MED ORDER — HEPARIN SOD (PORK) LOCK FLUSH 100 UNIT/ML IV SOLN
500.0000 [IU] | Freq: Once | INTRAVENOUS | Status: AC
  Administered 2023-11-17: 500 [IU] via INTRAVENOUS

## 2023-11-17 NOTE — Patient Instructions (Signed)
 [facility] [place] - [facility]  Discharge Instructions: Thank you for choosing [facility] to provide your oncology and hematology care.  If you have a lab appointment with the Cancer Center - please note that after April 8th, 2024, all labs will be drawn in the cancer center.  You do not have to check in or register with the main entrance as you have in the past but will complete your check-in in the cancer center.  Wear comfortable clothing and clothing appropriate for easy access to any Portacath or PICC line.   We strive to give you quality time with your provider. You may need to reschedule your appointment if you arrive late (15 or more minutes).  Arriving late affects you and other patients whose appointments are after yours.  Also, if you miss three or more appointments without notifying the office, you may be dismissed from the clinic at the provider's discretion.      For prescription refill requests, have your pharmacy contact our office and allow 72 hours for refills to be completed.    Today you received the following chemotherapy and/or immunotherapy agents Vidaza.  Azacitidine Injection What is this medication? AZACITIDINE (ay za SITE i deen) treats blood and bone marrow cancers. It works by slowing down the growth of cancer cells. This medicine may be used for other purposes; ask your health care provider or pharmacist if you have questions. COMMON BRAND NAME(S): Vidaza What should I tell my care team before I take this medication? They need to know if you have any of these conditions: Kidney disease Liver disease Low blood cell levels, such as low white cells, platelets, or red blood cells Low levels of albumin in the blood Low levels of bicarbonate in the blood An unusual or allergic reaction to azacitidine, mannitol, other medications, foods, dyes, or preservatives If you or your partner are pregnant or trying to get  pregnant Breast-feeding How should I use this medication? This medication is injected into a vein or under the skin. It is given by your care team in a hospital or clinic setting. Talk to your care team about the use of this medication in children. While it may be prescribed for children as young as 1 month for selected conditions, precautions do apply. Overdosage: If you think you have taken too much of this medicine contact a poison control center or emergency room at once. NOTE: This medicine is only for you. Do not share this medicine with others. What if I miss a dose? Keep appointments for follow-up doses. It is important not to miss your dose. Call your care team if you are unable to keep an appointment. What may interact with this medication? Interactions are not expected. This list may not describe all possible interactions. Give your health care provider a list of all the medicines, herbs, non-prescription drugs, or dietary supplements you use. Also tell them if you smoke, drink alcohol, or use illegal drugs. Some items may interact with your medicine. What should I watch for while using this medication? Your condition will be monitored carefully while you are receiving this medication. This medication may make you feel generally unwell. This is not uncommon as chemotherapy can affect healthy cells as well as cancer cells. Report any side effects. Continue your course of treatment even though you feel ill unless your care team tells you to stop. You may need blood work done while you are  taking this medication. Other product types may be available that contain the medication azacitidine. The injection and oral products should not be used in place of one another. Talk to your care team if you have questions. This medication can cause serious side effects. To reduce the risk, your care team may give you other medications to take before receiving this one. Be sure to follow the directions from  your care team. This medication may increase your risk of getting an infection. Call your care team for advice if you get a fever, chills, sore throat, or other symptoms of a cold or flu. Do not treat yourself. Try to avoid being around people who are sick. Avoid taking medications that contain aspirin, acetaminophen, ibuprofen, naproxen, or ketoprofen unless instructed by your care team. These medications may hide a fever. Be careful brushing or flossing your teeth or using a toothpick because you may get an infection or bleed more easily. If you have any dental work done, tell your dentist you are receiving this medication. Talk to your care team if you or your partner may be pregnant. Serious birth defects can occur if you take this medication during pregnancy and for 6 months after the last dose. You will need a negative pregnancy test before starting this medication. Contraception is recommended while taking this medication and for 6 months after the last dose. Your care team can help you find the option that works for you. If your partner can get pregnant, use a condom during sex while taking this medication and for 3 months after the last dose. Do not breastfeed while taking this medication and for 1 week after the last dose. This medication may cause infertility. Talk to your care team if you are concerned about your fertility. What side effects may I notice from receiving this medication? Side effects that you should report to your care team as soon as possible: Allergic reactions--skin rash, itching, hives, swelling of the face, lips, tongue, or throat Infection--fever, chills, cough, sore throat, wounds that don't heal, pain or trouble when passing urine, general feeling of discomfort or being unwell Kidney injury--decrease in the amount of urine, swelling of the ankles, hands, or feet Liver injury--right upper belly pain, loss of appetite, nausea, light-colored stool, dark yellow or Iyla Balzarini  urine, yellowing skin or eyes, unusual weakness or fatigue Low red blood cell level--unusual weakness or fatigue, dizziness, headache, trouble breathing Tumor lysis syndrome (TLS)--nausea, vomiting, diarrhea, decrease in the amount of urine, dark urine, unusual weakness or fatigue, confusion, muscle pain or cramps, fast or irregular heartbeat, joint pain Unusual bruising or bleeding Side effects that usually do not require medical attention (report to your care team if they continue or are bothersome): Constipation Diarrhea Nausea Pain, redness, or irritation at injection site Vomiting This list may not describe all possible side effects. Call your doctor for medical advice about side effects. You may report side effects to FDA at 1-800-FDA-1088. Where should I keep my medication? This medication is given in a hospital or clinic. It will not be stored at home. NOTE: This sheet is a summary. It may not cover all possible information. If you have questions about this medicine, talk to your doctor, pharmacist, or health care provider.  2024 Elsevier/Gold Standard (2023-03-07 00:00:00)       To help prevent nausea and vomiting after your treatment, we encourage you to take your nausea medication as directed.  BELOW ARE SYMPTOMS THAT SHOULD BE REPORTED IMMEDIATELY: *FEVER GREATER THAN 100.4  F (38 C) OR HIGHER *CHILLS OR SWEATING *NAUSEA AND VOMITING THAT IS NOT CONTROLLED WITH YOUR NAUSEA MEDICATION *UNUSUAL SHORTNESS OF BREATH *UNUSUAL BRUISING OR BLEEDING *URINARY PROBLEMS (pain or burning when urinating, or frequent urination) *BOWEL PROBLEMS (unusual diarrhea, constipation, pain near the anus) TENDERNESS IN MOUTH AND THROAT WITH OR WITHOUT PRESENCE OF ULCERS (sore throat, sores in mouth, or a toothache) UNUSUAL RASH, SWELLING OR PAIN  UNUSUAL VAGINAL DISCHARGE OR ITCHING   Items with * indicate a potential emergency and should be followed up as soon as possible or go to the Emergency  Department if any problems should occur.  Please show the CHEMOTHERAPY ALERT CARD or IMMUNOTHERAPY ALERT CARD at check-in to the Emergency Department and triage nurse.  Should you have questions after your visit or need to cancel or reschedule your appointment, please contact [facility] CANCER CTR [place] - A DEPT OF Eligha Bridegroom [facility] [phone]  and follow the prompts.  Office hours are 8:00 a.m. to 4:30 p.m. Monday - Friday. Please note that voicemails left after 4:00 p.m. may not be returned until the following business day.  We are closed weekends and major holidays. You have access to a nurse at all times for urgent questions. Please call the main number to the clinic [phone] and follow the prompts.  For any non-urgent questions, you may also contact your provider using MyChart. We now offer e-Visits for anyone 88 and older to request care online for non-urgent symptoms. For details visit [email or web address].   Also download the MyChart app! Go to the app store, search "MyChart", open the app, select [place], and log in with your MyChart username and password.

## 2023-11-17 NOTE — Progress Notes (Signed)
 Patient presents today for Vidaza  infusion. D4 C16. Vitals signs and previous lab work within parameters for treatment. Patient denies any side effects related to treatment. MAR reviewed.   Vidaza  given today per MD orders. Tolerated infusion without adverse affects. Vital signs stable. No complaints at this time. Discharged from clinic ambulatory in stable condition. Alert and oriented x 3. F/U with [facility] as scheduled.

## 2023-11-18 ENCOUNTER — Other Ambulatory Visit: Payer: Self-pay

## 2023-11-18 ENCOUNTER — Inpatient Hospital Stay

## 2023-11-18 ENCOUNTER — Encounter: Payer: Self-pay | Admitting: Hematology

## 2023-11-18 VITALS — BP 104/71 | HR 73 | Temp 97.6°F | Resp 18

## 2023-11-18 DIAGNOSIS — Z95828 Presence of other vascular implants and grafts: Secondary | ICD-10-CM

## 2023-11-18 DIAGNOSIS — E538 Deficiency of other specified B group vitamins: Secondary | ICD-10-CM | POA: Diagnosis not present

## 2023-11-18 DIAGNOSIS — C9310 Chronic myelomonocytic leukemia not having achieved remission: Secondary | ICD-10-CM | POA: Diagnosis not present

## 2023-11-18 DIAGNOSIS — Z881 Allergy status to other antibiotic agents status: Secondary | ICD-10-CM | POA: Diagnosis not present

## 2023-11-18 DIAGNOSIS — D696 Thrombocytopenia, unspecified: Secondary | ICD-10-CM | POA: Diagnosis not present

## 2023-11-18 DIAGNOSIS — Z5111 Encounter for antineoplastic chemotherapy: Secondary | ICD-10-CM | POA: Diagnosis not present

## 2023-11-18 DIAGNOSIS — Z882 Allergy status to sulfonamides status: Secondary | ICD-10-CM | POA: Diagnosis not present

## 2023-11-18 DIAGNOSIS — K5900 Constipation, unspecified: Secondary | ICD-10-CM | POA: Diagnosis not present

## 2023-11-18 DIAGNOSIS — Z79899 Other long term (current) drug therapy: Secondary | ICD-10-CM | POA: Diagnosis not present

## 2023-11-18 DIAGNOSIS — D7581 Myelofibrosis: Secondary | ICD-10-CM | POA: Diagnosis not present

## 2023-11-18 MED ORDER — SODIUM CHLORIDE 0.9 % IV SOLN
75.0000 mg/m2 | Freq: Once | INTRAVENOUS | Status: AC
  Administered 2023-11-18: 126 mg via INTRAVENOUS
  Filled 2023-11-18: qty 12.6

## 2023-11-18 MED ORDER — PALONOSETRON HCL INJECTION 0.25 MG/5ML
0.2500 mg | Freq: Once | INTRAVENOUS | Status: AC
  Administered 2023-11-18: 0.25 mg via INTRAVENOUS
  Filled 2023-11-18: qty 5

## 2023-11-18 MED ORDER — SODIUM CHLORIDE 0.9 % IV SOLN: Freq: Once | INTRAVENOUS | Status: AC

## 2023-11-18 MED ORDER — HEPARIN SOD (PORK) LOCK FLUSH 100 UNIT/ML IV SOLN
500.0000 [IU] | Freq: Once | INTRAVENOUS | Status: AC
  Administered 2023-11-18: 500 [IU] via INTRAVENOUS

## 2023-11-18 NOTE — Patient Instructions (Signed)
 [facility] [place] [facility]  Discharge Instructions: Thank you for choosing [facility] to provide your oncology and hematology care.  If you have a lab appointment with the Cancer Center - please note that after April 8th, 2024, all labs will be drawn in the cancer center.  You do not have to check in or register with the main entrance as you have in the past but will complete your check-in in the cancer center.  Wear comfortable clothing and clothing appropriate for easy access to any Portacath or PICC line.   We strive to give you quality time with your provider. You may need to reschedule your appointment if you arrive late (15 or more minutes).  Arriving late affects you and other patients whose appointments are after yours.  Also, if you miss three or more appointments without notifying the office, you may be dismissed from the clinic at the provider's discretion.      For prescription refill requests, have your pharmacy contact our office and allow 72 hours for refills to be completed.    Today you received the following chemotherapy and/or immunotherapy agents Vidaza      To help prevent nausea and vomiting after your treatment, we encourage you to take your nausea medication as directed.  BELOW ARE SYMPTOMS THAT SHOULD BE REPORTED IMMEDIATELY: *FEVER GREATER THAN 100.4 F (38 C) OR HIGHER *CHILLS OR SWEATING *NAUSEA AND VOMITING THAT IS NOT CONTROLLED WITH YOUR NAUSEA MEDICATION *UNUSUAL SHORTNESS OF BREATH *UNUSUAL BRUISING OR BLEEDING *URINARY PROBLEMS (pain or burning when urinating, or frequent urination) *BOWEL PROBLEMS (unusual diarrhea, constipation, pain near the anus) TENDERNESS IN MOUTH AND THROAT WITH OR WITHOUT PRESENCE OF ULCERS (sore throat, sores in mouth, or a toothache) UNUSUAL RASH, SWELLING OR PAIN  UNUSUAL VAGINAL DISCHARGE OR ITCHING   Items with * indicate a potential emergency and should be followed up as  soon as possible or go to the Emergency Department if any problems should occur.  Please show the CHEMOTHERAPY ALERT CARD or IMMUNOTHERAPY ALERT CARD at check-in to the Emergency Department and triage nurse.  Should you have questions after your visit or need to cancel or reschedule your appointment, please contact [facility] [place] - A DEPT OF Eligha Bridegroom [facility] [phone]  and follow the prompts.  Office hours are 8:00 a.m. to 4:30 p.m. Monday - Friday. Please note that voicemails left after 4:00 p.m. may not be returned until the following business day.  We are closed weekends and major holidays. You have access to a nurse at all times for urgent questions. Please call the main number to the clinic [phone] and follow the prompts.  For any non-urgent questions, you may also contact your provider using MyChart. We now offer e-Visits for anyone 72 and older to request care online for non-urgent symptoms. For details visit [email or web address].   Also download the MyChart app! Go to the app store, search "MyChart", open the app, select [place], and log in with your MyChart username and password.

## 2023-11-18 NOTE — Progress Notes (Signed)
 Patient tolerated chemotherapy with no complaints voiced.  Side effects with management reviewed with understanding verbalized.  Port site clean and dry with no bruising or swelling noted at site.  Good blood return noted before and after administration of chemotherapy.  Band aid applied.  Patient left in satisfactory condition with VSS and no s/s of distress noted. All follow ups as scheduled.   [name] [employer]

## 2023-11-18 NOTE — Progress Notes (Signed)
 Specialty Pharmacy Refill Coordination Note  [name] is a 73 y.o. female contacted today regarding refills of specialty medication(s) Tezepelumab -ekko (Tezspire )   Patient requested Courier to Provider Office   Delivery date: 11/22/23   Verified address: [facility]- [address] [place] [address]   Medication will be filled on 05.05.25.

## 2023-11-21 ENCOUNTER — Inpatient Hospital Stay: Admitting: Dietician

## 2023-11-21 ENCOUNTER — Inpatient Hospital Stay

## 2023-11-21 VITALS — BP 123/73 | HR 57 | Resp 18

## 2023-11-21 DIAGNOSIS — C9310 Chronic myelomonocytic leukemia not having achieved remission: Secondary | ICD-10-CM

## 2023-11-21 DIAGNOSIS — Z881 Allergy status to other antibiotic agents status: Secondary | ICD-10-CM | POA: Diagnosis not present

## 2023-11-21 DIAGNOSIS — E538 Deficiency of other specified B group vitamins: Secondary | ICD-10-CM | POA: Diagnosis not present

## 2023-11-21 DIAGNOSIS — Z5111 Encounter for antineoplastic chemotherapy: Secondary | ICD-10-CM | POA: Diagnosis not present

## 2023-11-21 DIAGNOSIS — Z95828 Presence of other vascular implants and grafts: Secondary | ICD-10-CM

## 2023-11-21 DIAGNOSIS — D7581 Myelofibrosis: Secondary | ICD-10-CM | POA: Diagnosis not present

## 2023-11-21 DIAGNOSIS — Z79899 Other long term (current) drug therapy: Secondary | ICD-10-CM | POA: Diagnosis not present

## 2023-11-21 DIAGNOSIS — Z882 Allergy status to sulfonamides status: Secondary | ICD-10-CM | POA: Diagnosis not present

## 2023-11-21 DIAGNOSIS — D696 Thrombocytopenia, unspecified: Secondary | ICD-10-CM | POA: Diagnosis not present

## 2023-11-21 DIAGNOSIS — K5900 Constipation, unspecified: Secondary | ICD-10-CM | POA: Diagnosis not present

## 2023-11-21 LAB — CBC WITH DIFFERENTIAL/PLATELET
Abs Immature Granulocytes: 0.4 10*3/uL — ABNORMAL HIGH (ref 0.00–0.07)
Band Neutrophils: 3 %
Basophils Absolute: 0 10*3/uL (ref 0.0–0.1)
Basophils Relative: 0 %
Eosinophils Absolute: 0.2 10*3/uL (ref 0.0–0.5)
Eosinophils Relative: 6 %
HCT: 25.4 % — ABNORMAL LOW (ref 36.0–46.0)
Hemoglobin: 8.1 g/dL — ABNORMAL LOW (ref 12.0–15.0)
Lymphocytes Relative: 13 %
Lymphs Abs: 0.4 10*3/uL — ABNORMAL LOW (ref 0.7–4.0)
MCH: 31.3 pg (ref 26.0–34.0)
MCHC: 31.9 g/dL (ref 30.0–36.0)
MCV: 98.1 fL (ref 80.0–100.0)
Metamyelocytes Relative: 6 %
Monocytes Absolute: 0.1 10*3/uL (ref 0.1–1.0)
Monocytes Relative: 2 %
Myelocytes: 5 %
Neutro Abs: 2.1 10*3/uL (ref 1.7–7.7)
Neutrophils Relative %: 64 %
Platelets: 84 10*3/uL — ABNORMAL LOW (ref 150–400)
Promyelocytes Relative: 1 %
RBC: 2.59 MIL/uL — ABNORMAL LOW (ref 3.87–5.11)
RDW: 19.4 % — ABNORMAL HIGH (ref 11.5–15.5)
WBC: 3.2 10*3/uL — ABNORMAL LOW (ref 4.0–10.5)
nRBC: 2.9 % — ABNORMAL HIGH (ref 0.0–0.2)
nRBC: 3 /100{WBCs} — ABNORMAL HIGH

## 2023-11-21 LAB — COMPREHENSIVE METABOLIC PANEL WITH GFR
ALT: 21 U/L (ref 0–44)
AST: 33 U/L (ref 15–41)
Albumin: 4 g/dL (ref 3.5–5.0)
Alkaline Phosphatase: 64 U/L (ref 38–126)
Anion gap: 9 (ref 5–15)
BUN: 14 mg/dL (ref 8–23)
CO2: 27 mmol/L (ref 22–32)
Calcium: 9.1 mg/dL (ref 8.9–10.3)
Chloride: 98 mmol/L (ref 98–111)
Creatinine, Ser: 0.73 mg/dL (ref 0.44–1.00)
GFR, Estimated: >60 mL/min (ref >60)
Glucose, Bld: 94 mg/dL (ref 70–99)
Potassium: 4.1 mmol/L (ref 3.5–5.1)
Sodium: 134 mmol/L — ABNORMAL LOW (ref 135–145)
Total Bilirubin: 1.2 mg/dL (ref 0.0–1.2)
Total Protein: 6.8 g/dL (ref 6.5–8.1)

## 2023-11-21 LAB — MAGNESIUM: Magnesium: 2.1 mg/dL (ref 1.7–2.4)

## 2023-11-21 MED ORDER — SODIUM CHLORIDE 0.9% FLUSH: 10.0000 mL | Freq: Once | INTRAVENOUS | Status: DC

## 2023-11-21 MED ORDER — SODIUM CHLORIDE 0.9 % IV SOLN
75.0000 mg/m2 | Freq: Once | INTRAVENOUS | Status: AC
  Administered 2023-11-21: 126 mg via INTRAVENOUS
  Filled 2023-11-21: qty 12.6

## 2023-11-21 MED ORDER — HEPARIN SOD (PORK) LOCK FLUSH 100 UNIT/ML IV SOLN
500.0000 [IU] | Freq: Once | INTRAVENOUS | Status: AC
  Administered 2023-11-21: 500 [IU] via INTRAVENOUS

## 2023-11-21 MED ORDER — PALONOSETRON HCL INJECTION 0.25 MG/5ML
0.2500 mg | Freq: Once | INTRAVENOUS | Status: AC
  Administered 2023-11-21: 0.25 mg via INTRAVENOUS
  Filled 2023-11-21: qty 5

## 2023-11-21 MED ORDER — SODIUM CHLORIDE 0.9 % IV SOLN
INTRAVENOUS | Status: DC
Start: 2023-11-21 — End: 2023-11-21

## 2023-11-21 NOTE — Progress Notes (Signed)
 Platelets 84,000. Message sent to Dr. Cheree Cords and Virgel Griffes [facility] and platelets reported. Per treatment plan nursing communication NO labs required prior to chemotherapy administration. Vital signs stable.

## 2023-11-21 NOTE — Patient Instructions (Signed)
 [facility] [place] [facility]  Discharge Instructions: Thank you for choosing [facility] to provide your oncology and hematology care.  If you have a lab appointment with the Cancer Center - please note that after April 8th, 2024, all labs will be drawn in the cancer center.  You do not have to check in or register with the main entrance as you have in the past but will complete your check-in in the cancer center.  Wear comfortable clothing and clothing appropriate for easy access to any Portacath or PICC line.   We strive to give you quality time with your provider. You may need to reschedule your appointment if you arrive late (15 or more minutes).  Arriving late affects you and other patients whose appointments are after yours.  Also, if you miss three or more appointments without notifying the office, you may be dismissed from the clinic at the provider's discretion.      For prescription refill requests, have your pharmacy contact our office and allow 72 hours for refills to be completed.    Today you received the following chemotherapy and/or immunotherapy agents vidaza.       To help prevent nausea and vomiting after your treatment, we encourage you to take your nausea medication as directed.  BELOW ARE SYMPTOMS THAT SHOULD BE REPORTED IMMEDIATELY: *FEVER GREATER THAN 100.4 F (38 C) OR HIGHER *CHILLS OR SWEATING *NAUSEA AND VOMITING THAT IS NOT CONTROLLED WITH YOUR NAUSEA MEDICATION *UNUSUAL SHORTNESS OF BREATH *UNUSUAL BRUISING OR BLEEDING *URINARY PROBLEMS (pain or burning when urinating, or frequent urination) *BOWEL PROBLEMS (unusual diarrhea, constipation, pain near the anus) TENDERNESS IN MOUTH AND THROAT WITH OR WITHOUT PRESENCE OF ULCERS (sore throat, sores in mouth, or a toothache) UNUSUAL RASH, SWELLING OR PAIN  UNUSUAL VAGINAL DISCHARGE OR ITCHING   Items with * indicate a potential emergency and should be followed up  as soon as possible or go to the Emergency Department if any problems should occur.  Please show the CHEMOTHERAPY ALERT CARD or IMMUNOTHERAPY ALERT CARD at check-in to the Emergency Department and triage nurse.  Should you have questions after your visit or need to cancel or reschedule your appointment, please contact [facility] CANCER CTR [place] - A DEPT OF [facility] Bridegroom [facility] [phone]  and follow the prompts.  Office hours are 8:00 a.m. to 4:30 p.m. Monday - Friday. Please note that voicemails left after 4:00 p.m. may not be returned until the following business day.  We are closed weekends and major holidays. You have access to a nurse at all times for urgent questions. Please call the main number to the clinic [phone] and follow the prompts.  For any non-urgent questions, you may also contact your provider using MyChart. We now offer e-Visits for anyone 8 and older to request care online for non-urgent symptoms. For details visit [email or web address].   Also download the MyChart app! Go to the app store, search "MyChart", open the app, select [place], and log in with your MyChart username and password.

## 2023-11-21 NOTE — Progress Notes (Signed)
 Nutrition Assessment   Reason for Assessment: Referral (decreased appetite)    ASSESSMENT: 73 year old female with CMML. She is receiving Vidaza  q28d. Patient is under the care of Dr. Cheree Cords  Past medical history includes CHF (does not have per pt), dysphagia, GERD, vertigo, anxiety, depression  Met with patient in infusion. Treatment finished early and she is ready to go. Patient reports appetite varies. Typically eats 2 meals. Patient does not take megace  for appetite regularly. Patient drinks Ensure/Boost when not feeling hungry. Currently drinking 2-3/day. These are expensive. She was glad to see weights have improved. Patient denies nausea, vomiting, diarrhea, constipation.  Nutrition Focused Physical Exam: deferred     Medications: trazodone , tezspire , compazine , megace , lactulose , hycodan, folvite , prozac, pepcid , gabapentin   Labs: Na 134   Anthropometrics:   Height: 5'1" Weight: 129 lb 10.1 oz UBW: 130-135 lb    BMI: 24.49   NUTRITION DIAGNOSIS: Food and nutrition related knowledge deficit related to cancer as evidenced by no prior need for associated nutrition information   INTERVENTION:  Encourage small frequent meals/snacks with adequate calories and protein Continue drinking Ensure Plus/equivalent 2-3/day in between meals - coupons provided Take appetite stimulant per MD    MONITORING, EVALUATION, GOAL: Patient will tolerate increased calories and protein to minimize further wt loss    Next Visit: Monday Z[facility]aya 2 during infusion

## 2023-11-21 NOTE — Progress Notes (Signed)
 Patient tolerated chemotherapy with no complaints voiced.  Side effects with management reviewed with understanding verbalized.  Port site clean and dry with no bruising or swelling noted at site.  Good blood return noted before and after administration of chemotherapy.  Port left accessed due to patient returning to clinic 11/22/23 for treatment. Dressing reinforced. Patient left in satisfactory condition with VSS and no s/s of distress noted. All follow ups as scheduled.   [name]

## 2023-11-22 ENCOUNTER — Inpatient Hospital Stay

## 2023-11-22 VITALS — BP 118/85 | HR 74 | Temp 98.0°F | Resp 18 | Wt 131.4 lb

## 2023-11-22 DIAGNOSIS — Z882 Allergy status to sulfonamides status: Secondary | ICD-10-CM | POA: Diagnosis not present

## 2023-11-22 DIAGNOSIS — D7581 Myelofibrosis: Secondary | ICD-10-CM | POA: Diagnosis not present

## 2023-11-22 DIAGNOSIS — D696 Thrombocytopenia, unspecified: Secondary | ICD-10-CM | POA: Diagnosis not present

## 2023-11-22 DIAGNOSIS — C9310 Chronic myelomonocytic leukemia not having achieved remission: Secondary | ICD-10-CM | POA: Diagnosis not present

## 2023-11-22 DIAGNOSIS — Z881 Allergy status to other antibiotic agents status: Secondary | ICD-10-CM | POA: Diagnosis not present

## 2023-11-22 DIAGNOSIS — Z79899 Other long term (current) drug therapy: Secondary | ICD-10-CM | POA: Diagnosis not present

## 2023-11-22 DIAGNOSIS — Z95828 Presence of other vascular implants and grafts: Secondary | ICD-10-CM

## 2023-11-22 DIAGNOSIS — Z5111 Encounter for antineoplastic chemotherapy: Secondary | ICD-10-CM | POA: Diagnosis not present

## 2023-11-22 DIAGNOSIS — E538 Deficiency of other specified B group vitamins: Secondary | ICD-10-CM | POA: Diagnosis not present

## 2023-11-22 DIAGNOSIS — K5900 Constipation, unspecified: Secondary | ICD-10-CM | POA: Diagnosis not present

## 2023-11-22 MED ORDER — AZACITIDINE CHEMO INJECTION 100 MG
75.0000 mg/m2 | Freq: Once | INTRAMUSCULAR | Status: AC
  Administered 2023-11-22: 126 mg via INTRAVENOUS
  Filled 2023-11-22: qty 12.6

## 2023-11-22 MED ORDER — SODIUM CHLORIDE 0.9% FLUSH
10.0000 mL | Freq: Once | INTRAVENOUS | Status: AC
  Administered 2023-11-22: 10 mL via INTRAVENOUS

## 2023-11-22 MED ORDER — HEPARIN SOD (PORK) LOCK FLUSH 100 UNIT/ML IV SOLN
500.0000 [IU] | Freq: Once | INTRAVENOUS | Status: AC
  Administered 2023-11-22: 500 [IU] via INTRAVENOUS

## 2023-11-22 MED ORDER — SODIUM CHLORIDE 0.9 % IV SOLN: Freq: Once | INTRAVENOUS | Status: AC

## 2023-11-22 NOTE — Progress Notes (Signed)
 Patient presents today for chemotherapy infusion.  Patient is in satisfactory condition with no new complaints voiced.  Vital signs are stable.  Labs reviewed.  We will proceed with treatment per MD orders.    Patient tolerated treatment well with no complaints voiced.  Patient left ambulatory in stable condition.  Vital signs stable at discharge.  Follow up as scheduled.

## 2023-11-22 NOTE — Patient Instructions (Signed)
 [facility] [place] - [facility] DE[facility] OF [facility]  Discharge Instructions: Thank you for choosing [facility] to provide your oncology and hematology care.  If you have a lab appointment with the Cancer Center - please note that after April 8th, 2024, all labs will be drawn in the cancer center.  You do not have to check in or register with the main entrance as you have in the past but will complete your check-in in the cancer center.  Wear comfortable clothing and clothing appropriate for easy access to any Portacath or PICC line.   We strive to give you quality time with your provider. You may need to reschedule your appointment if you arrive late (15 or more minutes).  Arriving late affects you and other patients whose appointments are after yours.  Also, if you miss three or more appointments without notifying the office, you may be dismissed from the clinic at the provider's discretion.      For prescription refill requests, have your pharmacy contact our office and allow 72 hours for refills to be completed.    Today you received the following chemotherapy and/or immunotherapy agents Vidaza.  Azacitidine Injection What is this medication? AZACITIDINE (ay za SITE i deen) treats blood and bone marrow cancers. It works by slowing down the growth of cancer cells. This medicine may be used for other purposes; ask your health care provider or pharmacist if you have questions. COMMON BRAND NAME(S): Vidaza What should I tell my care team before I take this medication? They need to know if you have any of these conditions: Kidney disease Liver disease Low blood cell levels, such as low white cells, platelets, or red blood cells Low levels of albumin in the blood Low levels of bicarbonate in the blood An unusual or allergic reaction to azacitidine, mannitol, other medications, foods, dyes, or preservatives If you or your partner are pregnant or trying to get  pregnant Breast-feeding How should I use this medication? This medication is injected into a vein or under the skin. It is given by your care team in a hospital or clinic setting. Talk to your care team about the use of this medication in children. While it may be prescribed for children as young as 1 month for selected conditions, precautions do apply. Overdosage: If you think you have taken too much of this medicine contact a poison control center or emergency room at once. NOTE: This medicine is only for you. Do not share this medicine with others. What if I miss a dose? Keep appointments for follow-up doses. It is important not to miss your dose. Call your care team if you are unable to keep an appointment. What may interact with this medication? Interactions are not expected. This list may not describe all possible interactions. Give your health care provider a list of all the medicines, herbs, non-prescription drugs, or dietary supplements you use. Also tell them if you smoke, drink alcohol, or use illegal drugs. Some items may interact with your medicine. What should I watch for while using this medication? Your condition will be monitored carefully while you are receiving this medication. This medication may make you feel generally unwell. This is not uncommon as chemotherapy can affect healthy cells as well as cancer cells. Report any side effects. Continue your course of treatment even though you feel ill unless your care team tells you to stop. You may need blood work done while you are  taking this medication. Other product types may be available that contain the medication azacitidine. The injection and oral products should not be used in place of one another. Talk to your care team if you have questions. This medication can cause serious side effects. To reduce the risk, your care team may give you other medications to take before receiving this one. Be sure to follow the directions from  your care team. This medication may increase your risk of getting an infection. Call your care team for advice if you get a fever, chills, sore throat, or other symptoms of a cold or flu. Do not treat yourself. Try to avoid being around people who are sick. Avoid taking medications that contain aspirin, acetaminophen, ibuprofen, naproxen, or ketoprofen unless instructed by your care team. These medications may hide a fever. Be careful brushing or flossing your teeth or using a toothpick because you may get an infection or bleed more easily. If you have any dental work done, tell your dentist you are receiving this medication. Talk to your care team if you or your partner may be pregnant. Serious birth defects can occur if you take this medication during pregnancy and for 6 months after the last dose. You will need a negative pregnancy test before starting this medication. Contraception is recommended while taking this medication and for 6 months after the last dose. Your care team can help you find the option that works for you. If your partner can get pregnant, use a condom during sex while taking this medication and for 3 months after the last dose. Do not breastfeed while taking this medication and for 1 week after the last dose. This medication may cause infertility. Talk to your care team if you are concerned about your fertility. What side effects may I notice from receiving this medication? Side effects that you should report to your care team as soon as possible: Allergic reactions--skin rash, itching, hives, swelling of the face, lips, tongue, or throat Infection--fever, chills, cough, sore throat, wounds that don't heal, pain or trouble when passing urine, general feeling of discomfort or being unwell Kidney injury--decrease in the amount of urine, swelling of the ankles, hands, or feet Liver injury--right upper belly pain, loss of appetite, nausea, light-colored stool, dark yellow or [name]  urine, yellowing skin or eyes, unusual weakness or fatigue Low red blood cell level--unusual weakness or fatigue, dizziness, headache, trouble breathing Tumor lysis syndrome (TLS)--nausea, vomiting, diarrhea, decrease in the amount of urine, dark urine, unusual weakness or fatigue, confusion, muscle pain or cramps, fast or irregular heartbeat, joint pain Unusual bruising or bleeding Side effects that usually do not require medical attention (report to your care team if they continue or are bothersome): Constipation Diarrhea Nausea Pain, redness, or irritation at injection site Vomiting This list may not describe all possible side effects. Call your doctor for medical advice about side effects. You may report side effects to FDA at 1-800-FDA-1088. Where should I keep my medication? This medication is given in a hospital or clinic. It will not be stored at home. NOTE: This sheet is a summary. It may not cover all possible information. If you have questions about this medicine, talk to your doctor, pharmacist, or health care provider.  2024 Elsevier/Gold Standard (2023-03-07 00:00:00)       To help prevent nausea and vomiting after your treatment, we encourage you to take your nausea medication as directed.  BELOW ARE SYMPTOMS THAT SHOULD BE REPORTED IMMEDIATELY: *FEVER GREATER THAN 100.4  F (38 C) OR HIGHER *CHILLS OR SWEATING *NAUSEA AND VOMITING THAT IS NOT CONTROLLED WITH YOUR NAUSEA MEDICATION *UNUSUAL SHORTNESS OF BREATH *UNUSUAL BRUISING OR BLEEDING *URINARY PROBLEMS (pain or burning when urinating, or frequent urination) *BOWEL PROBLEMS (unusual diarrhea, constipation, pain near the anus) TENDERNESS IN MOUTH AND THROAT WITH OR WITHOUT PRESENCE OF ULCERS (sore throat, sores in mouth, or a toothache) UNUSUAL RASH, SWELLING OR PAIN  UNUSUAL VAGINAL DISCHARGE OR ITCHING   Items with * indicate a potential emergency and should be followed up as soon as possible or go to the Emergency  Department if any problems should occur.  Please show the CHEMOTHERAPY ALERT CARD or IMMUNOTHERAPY ALERT CARD at check-in to the Emergency Department and triage nurse.  Should you have questions after your visit or need to cancel or reschedule your appointment, please contact [facility] [place] - A DEPT OF [name] [phone]  and follow the prompts.  Office hours are 8:00 a.m. to 4:30 p.m. Monday - Friday. Please note that voicemails left after 4:00 p.m. may not be returned until the following business day.  We are closed weekends and major holidays. You have access to a nurse at all times for urgent questions. Please call the main number to the clinic [phone] and follow the prompts.  For any non-urgent questions, you may also contact your provider using MyChart. We now offer e-Visits for anyone 88 and older to request care online for non-urgent symptoms. For details visit [email or web address].   Also download the MyChart app! Go to the app store, search "MyChart", open the app, select [place], and log in with your MyChart username and password.

## 2023-11-28 ENCOUNTER — Ambulatory Visit

## 2023-11-28 DIAGNOSIS — J4540 Moderate persistent asthma, uncomplicated: Secondary | ICD-10-CM | POA: Diagnosis not present

## 2023-12-13 ENCOUNTER — Inpatient Hospital Stay

## 2023-12-13 ENCOUNTER — Encounter: Payer: Self-pay | Admitting: Hematology

## 2023-12-13 ENCOUNTER — Inpatient Hospital Stay (HOSPITAL_BASED_OUTPATIENT_CLINIC_OR_DEPARTMENT_OTHER): Admitting: Hematology

## 2023-12-13 VITALS — BP 108/61 | HR 72 | Temp 97.0°F | Resp 18

## 2023-12-13 VITALS — BP 122/71 | HR 74 | Temp 97.0°F | Resp 18 | Ht 61.61 in | Wt 124.0 lb

## 2023-12-13 DIAGNOSIS — Z95828 Presence of other vascular implants and grafts: Secondary | ICD-10-CM

## 2023-12-13 DIAGNOSIS — D7581 Myelofibrosis: Secondary | ICD-10-CM | POA: Diagnosis not present

## 2023-12-13 DIAGNOSIS — Z882 Allergy status to sulfonamides status: Secondary | ICD-10-CM | POA: Diagnosis not present

## 2023-12-13 DIAGNOSIS — Z79899 Other long term (current) drug therapy: Secondary | ICD-10-CM | POA: Diagnosis not present

## 2023-12-13 DIAGNOSIS — D696 Thrombocytopenia, unspecified: Secondary | ICD-10-CM | POA: Diagnosis not present

## 2023-12-13 DIAGNOSIS — C9310 Chronic myelomonocytic leukemia not having achieved remission: Secondary | ICD-10-CM | POA: Diagnosis not present

## 2023-12-13 DIAGNOSIS — Z881 Allergy status to other antibiotic agents status: Secondary | ICD-10-CM | POA: Diagnosis not present

## 2023-12-13 DIAGNOSIS — Z5111 Encounter for antineoplastic chemotherapy: Secondary | ICD-10-CM | POA: Diagnosis not present

## 2023-12-13 DIAGNOSIS — K5900 Constipation, unspecified: Secondary | ICD-10-CM | POA: Diagnosis not present

## 2023-12-13 DIAGNOSIS — E538 Deficiency of other specified B group vitamins: Secondary | ICD-10-CM | POA: Diagnosis not present

## 2023-12-13 LAB — COMPREHENSIVE METABOLIC PANEL WITH GFR
ALT: 11 U/L (ref 0–44)
AST: 28 U/L (ref 15–41)
Albumin: 4 g/dL (ref 3.5–5.0)
Alkaline Phosphatase: 56 U/L (ref 38–126)
Anion gap: 7 (ref 5–15)
BUN: 17 mg/dL (ref 8–23)
CO2: 26 mmol/L (ref 22–32)
Calcium: 9.4 mg/dL (ref 8.9–10.3)
Chloride: 106 mmol/L (ref 98–111)
Creatinine, Ser: 0.82 mg/dL (ref 0.44–1.00)
GFR, Estimated: >60 mL/min (ref >60)
Glucose, Bld: 121 mg/dL — ABNORMAL HIGH (ref 70–99)
Potassium: 4.2 mmol/L (ref 3.5–5.1)
Sodium: 139 mmol/L (ref 135–145)
Total Bilirubin: 1.1 mg/dL (ref 0.0–1.2)
Total Protein: 7 g/dL (ref 6.5–8.1)

## 2023-12-13 LAB — CBC WITH DIFFERENTIAL/PLATELET
Abs Immature Granulocytes: 0.2 10*3/uL — ABNORMAL HIGH (ref 0.00–0.07)
Band Neutrophils: 5 %
Basophils Absolute: 0 10*3/uL (ref 0.0–0.1)
Basophils Relative: 0 %
Eosinophils Absolute: 0.1 10*3/uL (ref 0.0–0.5)
Eosinophils Relative: 2 %
HCT: 28.2 % — ABNORMAL LOW (ref 36.0–46.0)
Hemoglobin: 8.9 g/dL — ABNORMAL LOW (ref 12.0–15.0)
Lymphocytes Relative: 14 %
Lymphs Abs: 0.5 10*3/uL — ABNORMAL LOW (ref 0.7–4.0)
MCH: 31.1 pg (ref 26.0–34.0)
MCHC: 31.6 g/dL (ref 30.0–36.0)
MCV: 98.6 fL (ref 80.0–100.0)
Metamyelocytes Relative: 2 %
Monocytes Absolute: 0.3 10*3/uL (ref 0.1–1.0)
Monocytes Relative: 9 %
Myelocytes: 4 %
Neutro Abs: 2.3 10*3/uL (ref 1.7–7.7)
Neutrophils Relative %: 64 %
Platelets: 161 10*3/uL (ref 150–400)
RBC: 2.86 MIL/uL — ABNORMAL LOW (ref 3.87–5.11)
RDW: 19.9 % — ABNORMAL HIGH (ref 11.5–15.5)
WBC: 3.3 10*3/uL — ABNORMAL LOW (ref 4.0–10.5)
nRBC: 2 /100{WBCs} — ABNORMAL HIGH
nRBC: 2.4 % — ABNORMAL HIGH (ref 0.0–0.2)

## 2023-12-13 LAB — LACTATE DEHYDROGENASE: LDH: 606 U/L — ABNORMAL HIGH (ref 98–192)

## 2023-12-13 LAB — MAGNESIUM: Magnesium: 2 mg/dL (ref 1.7–2.4)

## 2023-12-13 MED ORDER — HEPARIN SOD (PORK) LOCK FLUSH 100 UNIT/ML IV SOLN
500.0000 [IU] | Freq: Once | INTRAVENOUS | Status: AC
  Administered 2023-12-13: 500 [IU] via INTRAVENOUS

## 2023-12-13 MED ORDER — PALONOSETRON HCL INJECTION 0.25 MG/5ML
0.2500 mg | Freq: Once | INTRAVENOUS | Status: AC
  Administered 2023-12-13: 0.25 mg via INTRAVENOUS
  Filled 2023-12-13: qty 5

## 2023-12-13 MED ORDER — SODIUM CHLORIDE 0.9 % IV SOLN: Freq: Once | INTRAVENOUS | Status: AC

## 2023-12-13 MED ORDER — SODIUM CHLORIDE 0.9 % IV SOLN
75.0000 mg/m2 | Freq: Once | INTRAVENOUS | Status: AC
  Administered 2023-12-13: 126 mg via INTRAVENOUS
  Filled 2023-12-13: qty 12.6

## 2023-12-13 MED ORDER — SODIUM CHLORIDE 0.9% FLUSH
10.0000 mL | Freq: Once | INTRAVENOUS | Status: AC
  Administered 2023-12-13: 10 mL via INTRAVENOUS

## 2023-12-13 NOTE — Progress Notes (Signed)
 Patient okay for treatment per Dr. Katragadda. Patient tolerated chemotherapy with no complaints voiced. Side effects with management reviewed understanding verbalized. Port site clean and dry with no bruising or swelling noted at site. Good blood return noted before and after administration of chemotherapy. Patient left in satisfactory condition with VSS and no s/s of distress noted.

## 2023-12-13 NOTE — Patient Instructions (Signed)

## 2023-12-13 NOTE — Progress Notes (Signed)
 [facility] [address], [place] [address]    Clinic Day:  12/13/2023  Referring physician: Artemisa Bile, MD  Patient Care Team: Artemisa Bile, MD as PCP - General (Internal Medicine) Riley Cheadle Windsor Hatcher, MD as Attending Physician (Gastroenterology) Vernell Goldsmith, MD as Consulting Physician (Pulmonary Disease) Paulett Boros, MD as Medical Oncologist (Hematology)   ASSESSMENT & PLAN:   Assessment: 1.  Macrocytic anemia and thrombocytopenia: - Patient seen at the request of Dr. Hildy Lowers for abnormal CBC. - 04/17/2022: WBC 15.8, Hb-9.7, PLT-128 - 03/24/2022: WBC-8.1 (N-43%, L-17%, M-19%, B-2%), Hb-9.6, MCV-99, PLT-115 - Smear review: 13% metamyelocytes, 2% myelocytes, 2% blasts,  - 10/19/2021: WBC-5.3 (N-61, L-21, M-10%), Hb-11, MCV-96, PLT-97 - BMBX (05/25/2022): Hypercellular bone marrow with myeloid hyperplasia with dysgranulopoiesis, erythroid hypoplasia and megakaryocytic hyperplasia with dyspoiesis.  Chromosome analysis was 37, XX.  Bone marrow blasts less than 5%.  Peripheral blood blasts less than 2%. - Mayo molecular model risk stratification: Intermediate 2 risk with at least 2 points (decreased hemoglobin less than 10, circulating immature cells).  Intermediate 2 risk with median OS 31 months. - Serum EPO level 41. - NGS: Positive for CBL, MPL, SRSF2, TET 2, RAD21 - PET scan (07/08/2022): Splenomegaly (volume 510 mm) with normal metabolic activity.  Uniform increase in marrow metabolic activity related to patient's anemia.  No lymphadenopathy. - BMBX (07/16/2022): Hypercellular bone marrow (95-100%) with myeloid hyperplasia, atypical monocytes, increased blasts (16% overall).  Atypical monocytosis present 23% of total events, expressing HLA-DR, CD38, CD4 dim, CD11C, CD13, CD14, CD36, CD64 with aberrant coexpression of CD56 and CD7. - Cycle 1 of azacitidine  on 08/02/2022, cycle 2 on 08/30/2022.  Azacitidine  decreased to 5 days starting cycle 5 on 11/22/2022 - BMBX  (09/21/2022): 3% blasts, hypercellular marrow with markedly increased dyspoietic megakaryocytes, increased fibrosis - BMBX (11/16/2022): Hypercellular marrow (70-80%) with marked GIST [name] (, myelofibrosis and 3% blasts on a very limited sample. - BMBX (05/06/2023): Hypercellular marrow with 85% cellularity with fibrosis and 4% blasts approximately. - BMBX (07/08/2023): Hypercellular marrow (95%) with increased myelopoiesis, dysmegakrypoiesis, increased fibrosis and 5% blasts.  Cytogenetics 64, XX. - NGS (07/08/2023): MPL, SRSF2, RUNX1, TET2 - BMBX (09/13/2023): Hypercellular marrow (100%) with increased myelopoiesis and this megakaryocytes.  Moderate myelofibrosis, grade 2 of 3.  No increase in blasts (1%).   2.  Social/family history: - She lives at home by herself.  Son lives next door. - Does part-time work at [employer].  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  Non-smoker. - Maternal aunt had tumor in the breast, patient not certain if it is cancer.  2 maternal first cousins had lymphoma.  Mother had stomach cancer.    Plan: 1.  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 - No fevers or infections reported in the last 4 weeks. - Labs today: LDH 606, LFTs are normal.  White count is 3.3 with ANC of 2.3.  Platelet count is normal.  Hemoglobin is stable at 8.9. - She may proceed with cycle 17 azacitidine  (75 mg/m x 7 days).  She will follow-up with Dr. Ada Acres on 12/30/2023 with bone marrow biopsy.  RTC 4 weeks for follow-up.   2.  Folate deficiency: - Continue folic acid  tablet daily.   3.  Weight loss: - Continue boost/Ensure 1 to 2 cans/day.  She lost about 1 pound in the last 4 weeks.   4.  Constipation: -  Continue MiraLAX as needed.    Orders Placed This Encounter  Procedures   Comprehensive metabolic panel    Standing Status:   Future    Expected Date:   02/07/2024    Expiration  Date:   02/06/2025   Magnesium    Standing Status:   Future    Expected Date:   02/07/2024    Expiration Date:   02/06/2025   Lactate dehydrogenase    Standing Status:   Future    Expected Date:   02/07/2024    Expiration Date:   02/06/2025   CMP (Cancer Center only)    Standing Status:   Future    Expected Date:   02/07/2024    Expiration Date:   02/06/2025   CBC with Differential    Standing Status:   Future    Expected Date:   02/07/2024    Expiration Date:   02/06/2025      Nadeen Augusta Teague,acting as a scribe for Paulett Boros, MD.,have documented all relevant documentation on the behalf of Paulett Boros, MD,as directed by  Paulett Boros, MD while in the presence of Paulett Boros, MD.  I, Paulett Boros MD, have reviewed the above documentation for accuracy and completeness, and I agree with the above.     Paulett Boros, MD   5/27/202511:48 AM  CHIEF COMPLAINT:   Diagnosis: CMML-2    Cancer Staging  No matching staging information was found for the patient.    Prior Therapy: none  Current Therapy:  Azacitidine  70 mg/m x 7 days every 28 days    HISTORY OF PRESENT ILLNESS:   Oncology History  CMML (chronic myelomonocytic leukemia) (HCC)  06/03/2022 Initial Diagnosis   CMML (chronic myelomonocytic leukemia) (HCC)   08/02/2022 -  Chemotherapy   Patient is on Treatment Plan : MYELODYSPLASIA  Azacitidine  IVPB D1-7 q28d        INTERVAL HISTORY:   [name] is a 73 y.o. female presenting to clinic today for follow up of CMML-2. She was last seen by me on 11/14/23.  Today, she states that she is doing well overall. Her appetite level is at 100%. Her energy level is at 100%. Jakara has a follow-up with Dr. Ada Acres on 12/30/23. She denies any infections, fevers, or night sweats in the last 4 weeks. She is drinking Boost/Ensure and taking folic acid  as prescribed. She has lost one pound since her last visit and denies any changes in appetite.    PAST MEDICAL HISTORY:   Past Medical History: Past Medical History:  Diagnosis Date   Anxiety    Asthma    had 1 episode 1 year ago-no more problem   CHF (congestive heart failure) (HCC)    swelling of feet & ankles   CMML (chronic myelomonocytic leukemia) (HCC) 06/03/2022   Cough    Depression    OCD   GERD (gastroesophageal reflux disease)    severe   OCD (obsessive compulsive disorder)    Port-A-Cath in place 07/26/2022   Shortness of breath    with exertion   Yeast infection 04/09/2014    Surgical History: Past Surgical History:  Procedure Laterality Date   CARPAL TUNNEL RELEASE  03/22/2012   Procedure: CARPAL TUNNEL RELEASE;  Surgeon: Kemp Patter, MD;  Location: [facility];  Service: Orthopedics;  Laterality: Right;  right carpal tunnel release   COLONOSCOPY  06/19/2012   Dr. Rourk:normal rectum and colon    ESOPHAGOGASTRODUODENOSCOPY N/A 12/26/2015   Dr. Riley Cheadle: normal  IR IMAGING GUIDED PORT INSERTION  07/30/2022   TONSILLECTOMY     TUBAL LIGATION      Social History: Social History   Socioeconomic History   Marital status: Widowed    Spouse name: Not on file   Number of children: 1   Years of education: Not on file   Highest education level: Not on file  Occupational History   Occupation: retired  Tobacco Use   Smoking status: Never    Passive exposure: Yes   Smokeless tobacco: Never  Vaping Use   Vaping status: Never Used  Substance and Sexual Activity   Alcohol use: No   Drug use: No   Sexual activity: Yes    Birth control/protection: Surgical    Comment: tubal  Other Topics Concern   Not on file  Social History Narrative   Not on file   Social Drivers of Health   Financial Resource Strain: Not on file  Food Insecurity: Not on file  Transportation Needs: Not on file  Physical Activity: Not on file  Stress: Not on file  Social Connections: Not on file  Intimate Partner Violence: Not on file    Family History: Family  History  Problem Relation Age of Onset   Asthma Mother    Macular degeneration Mother    Hypertension Mother    Stomach cancer Mother 40   Alzheimer's disease Father    Other Sister        blood clots   Diabetes Maternal Grandfather    Diabetes Paternal Grandfather    Other Son        enlarged spleen   Lymphoma Cousin        x2, both dx in their 32s   Colon cancer Neg Hx     Current Medications:  Current Outpatient Medications:    albuterol  (VENTOLIN  HFA) 108 (90 Base) MCG/ACT inhaler, INHALE TWO PUFFS EVERY 4 HOURS AS NEEDED FOR WHEEZING OR FOR SHORTNESS OF BREATH, Disp: 18 g, Rfl: 1   albuterol  (VENTOLIN  HFA) 108 (90 Base) MCG/ACT inhaler, INHALE 2 PUFFS BY MOUTH EVERY 4 HOURS AS NEEDED FOR SHORTNESS OF BREATH OR WHEEZING., Disp: 18 g, Rfl: 1   azaCITIDine  5 mg/2 mLs in lactated ringers  infusion, Inject into the vein daily. Days 1-7 every 28 days, Disp: , Rfl:    azelastine  (ASTELIN ) 0.1 % nasal spray, SPRAY 1 TO 2 SPRAYS PER NOSTRIL TWICE DAILY., Disp: 30 mL, Rfl: 3   benzonatate  (TESSALON ) 200 MG capsule, Take 200 mg by mouth 3 (three) times daily as needed., Disp: , Rfl:    BREZTRI  AEROSPHERE 160-9-4.8 MCG/ACT AERO, Inhale 2 puffs into the lungs 2 (two) times daily., Disp: 10.7 g, Rfl: 5   cetirizine  (ZYRTEC ) 5 MG tablet, Take 1 tablet (5 mg total) by mouth daily., Disp: 30 tablet, Rfl: 0   ciclopirox  (PENLAC ) 8 % solution, APPLY TOPICALLY AT BEDTIME. APPLY OVER NAIL FOLD AND SURROUNDING SKIN. APPLY DAILY OVER PREVIOUS COAT. AFTER SEVEN DAYS, MAY REMOVE WITH ALCOHOL AND CONTINUE CYCLE., Disp: 6.6 mL, Rfl: 2   EPINEPHrine  0.3 mg/0.3 mL IJ SOAJ injection, Inject 0.3 mg into the muscle as needed for anaphylaxis., Disp: 2 each, Rfl: 1   famotidine  (PEPCID ) 20 MG tablet, Take 1 tablet (20 mg total) by mouth 2 (two) times daily., Disp: 60 tablet, Rfl: 5   FLUoxetine (PROZAC) 20 MG capsule, Take 1 capsule by mouth daily., Disp: , Rfl:    fluticasone  (FLONASE ) 50 MCG/ACT nasal spray,  Place 2 sprays into  both nostrils 2 (two) times daily., Disp: 16 g, Rfl: 5   folic acid  (FOLVITE ) 1 MG tablet, TAKE ONE TABLET BY MOUTH ONCE DAILY, Disp: 30 tablet, Rfl: 5   gabapentin (NEURONTIN) 300 MG capsule, , Disp: , Rfl:    HYDROcodone  bit-homatropine (HYCODAN) 5-1.5 MG/5ML syrup, , Disp: , Rfl:    ipratropium (ATROVENT ) 0.06 % nasal spray, USE TWO SPRAYS IN EACH NOSTRIL THREE TIMES DAILY AS NEEDED, Disp: 15 mL, Rfl: 5   lactulose  (CHRONULAC ) 10 GM/15ML solution, Take 15 mLs (10 g total) by mouth every 3 (three) hours as needed for mild constipation., Disp: 450 mL, Rfl: 2   lidocaine -prilocaine  (EMLA ) cream, Apply a quarter sized amount to port a cath site and cover with plastic wrap one hour prior to infusion appointments, Disp: 30 g, Rfl: 3   megestrol  (MEGACE ) 400 MG/10ML suspension, Take 10 mLs (400 mg total) by mouth 2 (two) times daily., Disp: 480 mL, Rfl: 2   montelukast  (SINGULAIR ) 10 MG tablet, Take 1 tablet (10 mg total) by mouth at bedtime., Disp: 30 tablet, Rfl: 5   NON FORMULARY, [facility]  Antifungal (nail)-#1, Disp: , Rfl:    pantoprazole  (PROTONIX ) 40 MG tablet, TAKE ONE TABLET BY MOUTH ONCE DAILY, Disp: 30 tablet, Rfl: 11   prochlorperazine  (COMPAZINE ) 10 MG tablet, Take 1 tablet (10 mg total) by mouth every 6 (six) hours as needed for nausea or vomiting., Disp: 30 tablet, Rfl: 0   Respiratory Therapy Supplies (FLUTTER) DEVI, Use as directed, Disp: 1 each, Rfl: 3   SF 5000 PLUS 1.1 % CREA dental cream, , Disp: , Rfl:    Spacer/Aero-Holding Chambers (AEROCHAMBER PLUS WITH MASK) inhaler, 1 each by Other route See admin instructions. Use with inhaler as instructed., Disp: 1 each, Rfl: 1   Tezepelumab -ekko (TEZSPIRE ) 210 MG/1. SOAJ, Inject 210 mg into the skin every 28 (twenty-eight) days., Disp: 1.91 mL, Rfl: 11   tiZANidine  (ZANAFLEX ) 2 MG tablet, Take 1 tablet (2 mg total) by mouth 2 (two) times daily as needed for muscle spasms., Disp: 15 tablet, Rfl: 0    traZODone  (DESYREL ) 50 MG tablet, TAKE ONE TABLET BY MOUTH AT BEDTIME, Disp: 30 tablet, Rfl: 3  Current Facility-Administered Medications:    tezepelumab -ekko (TEZSPIRE ) 210 MG/1. syringe 210 mg, 210 mg, Subcutaneous, Q28 days, Brian Campanile, MD, 210 mg at 11/28/23 1037  Facility-Administered Medications Ordered in Other Visits:    azaCITIDine  (VIDAZA ) 126 mg in sodium chloride  0.9 % 50 mL chemo infusion, 75 mg/m2 (Treatment Plan Recorded), Intravenous, Once, Paulett Boros, MD   heparin  lock flush 100 unit/mL, 500 Units, Intravenous, Once, Paulett Boros, MD   sodium chloride  flush (NS) 0.9 % injection 10 mL, 10 mL, Intravenous, Once, Paulett Boros, MD   Allergies: Allergies  Allergen Reactions   Levonorgestrel-Ethinyl Estrad Cough   Sulfa Antibiotics Other (See Comments)    Unknown- pt unsure of reaction; believes it may be nausea   Sulfamethoxazole-Trimethoprim Other (See Comments)   Cephalosporins Other (See Comments)    Other Reaction: Toxicity    REVIEW OF SYSTEMS:   Review of Systems  Constitutional:  Negative for chills, fatigue and fever.  HENT:   Negative for lump/mass, mouth sores, nosebleeds, sore throat and trouble swallowing.   Eyes:  Negative for eye problems.  Respiratory:  Negative for cough and shortness of breath.   Cardiovascular:  Positive for palpitations. Negative for chest pain and leg swelling.  Gastrointestinal:  Negative for abdominal pain, constipation, diarrhea, nausea and vomiting.  Genitourinary:  Negative for bladder incontinence, difficulty urinating, dysuria, frequency, hematuria and nocturia.   Musculoskeletal:  Negative for arthralgias, back pain, flank pain, myalgias and neck pain.  Skin:  Negative for itching and rash.  Neurological:  Negative for dizziness, headaches and numbness.  Hematological:  Does not bruise/bleed easily.  Psychiatric/Behavioral:  Positive for sleep disturbance. Negative for depression  and suicidal ideas. The patient is not nervous/anxious.   All other systems reviewed and are negative.    VITALS:   Blood pressure 122/71, pulse 74, temperature (!) 97 F (36.1 C), temperature source Tympanic, resp. rate 18, height 5' 1.61" (1.565 m), weight 124 lb (56.2 kg), SpO2 100%.  Wt Readings from Last 3 Encounters:  12/13/23 124 lb (56.2 kg)  11/22/23 131 lb 6.4 oz (59.6 kg)  11/21/23 129 lb 10.1 oz (58.8 kg)    Body mass index is 22.96 kg/m.  Performance status (ECOG): 1 - Symptomatic but completely ambulatory  PHYSICAL EXAM:   Physical Exam Vitals and nursing note reviewed. Exam conducted with a chaperone present.  Constitutional:      Appearance: Normal appearance.  Cardiovascular:     Rate and Rhythm: Normal rate and regular rhythm.     Pulses: Normal pulses.     Heart sounds: Normal heart sounds.  Pulmonary:     Effort: Pulmonary effort is normal.     Breath sounds: Normal breath sounds.  Abdominal:     Palpations: Abdomen is soft. There is no hepatomegaly, splenomegaly or mass.     Tenderness: There is no abdominal tenderness.  Musculoskeletal:     Right lower leg: No edema.     Left lower leg: No edema.  Lymphadenopathy:     Cervical: No cervical adenopathy.     Right cervical: No superficial, deep or posterior cervical adenopathy.    Left cervical: No superficial, deep or posterior cervical adenopathy.     Upper Body:     Right upper body: No supraclavicular or axillary adenopathy.     Left upper body: No supraclavicular or axillary adenopathy.  Neurological:     General: No focal deficit present.     Mental Status: She is alert and oriented to person, place, and time.  Psychiatric:        Mood and Affect: Mood normal.        Behavior: Behavior normal.     LABS:   CBC     Component Value Date/Time   WBC 3.3 (L) 12/13/2023 0941   RBC 2.86 (L) 12/13/2023 0941   HGB 8.9 (L) 12/13/2023 0941   HGB 13.1 07/23/2020 1112   HCT 28.2 (L) 12/13/2023  0941   HCT 40.7 07/23/2020 1112   PLT 161 12/13/2023 0941   MCV 98.6 12/13/2023 0941   MCV 95 07/23/2020 1112   MCH 31.1 12/13/2023 0941   MCHC 31.6 12/13/2023 0941   RDW 19.9 (H) 12/13/2023 0941   RDW 14.2 07/23/2020 1112   LYMPHSABS 0.5 (L) 12/13/2023 0941   LYMPHSABS 0.7 07/23/2020 1112   MONOABS 0.3 12/13/2023 0941   EOSABS 0.1 12/13/2023 0941   EOSABS 0.1 07/23/2020 1112   BASOSABS 0.0 12/13/2023 0941   BASOSABS 0.0 07/23/2020 1112    CMP      Component Value Date/Time   NA 139 12/13/2023 0941   K 4.2 12/13/2023 0941   CL 106 12/13/2023 0941   CO2 26 12/13/2023 0941   GLUCOSE 121 (H) 12/13/2023 0941   BUN 17 12/13/2023 0941   CREATININE 0.82 12/13/2023 0941  CALCIUM 9.4 12/13/2023 0941   PROT 7.0 12/13/2023 0941   ALBUMIN 4.0 12/13/2023 0941   AST 28 12/13/2023 0941   ALT 11 12/13/2023 0941   ALKPHOS 56 12/13/2023 0941   BILITOT 1.1 12/13/2023 0941   GFRNONAA >60 12/13/2023 0941   GFRAA >60 02/24/2019 1415     No results found for: "CEA1", "CEA" / No results found for: "CEA1", "CEA" No results found for: "PSA1" No results found for: "CAN199" No results found for: "CAN125"  Lab Results  Component Value Date   TOTALPROTELP 6.3 04/26/2022   ALBUMINELP 3.8 04/26/2022   A1GS 0.3 04/26/2022   A2GS 0.5 04/26/2022   BETS 0.8 04/26/2022   GAMS 0.9 04/26/2022   MSPIKE Not Observed 04/26/2022   SPEI Comment 04/26/2022   Lab Results  Component Value Date   TIBC 343 04/26/2022   FERRITIN 114 04/26/2022   IRONPCTSAT 25 04/26/2022   Lab Results  Component Value Date   LDH 606 (H) 12/13/2023   LDH 663 (H) 11/14/2023   LDH 627 (H) 10/17/2023     STUDIES:   No results found.

## 2023-12-13 NOTE — Progress Notes (Signed)
 Patient has been examined by Dr. Ellin Saba. Vital signs and labs have been reviewed by MD - ANC, Creatinine, LFTs, hemoglobin, and platelets are within treatment parameters per M.D. - pt may proceed with treatment.  Primary RN and pharmacy notified.

## 2023-12-13 NOTE — Patient Instructions (Signed)
 [facility] - A DEPT OF [facility]  Discharge Instructions: Thank you for choosing [facility] to provide your oncology and hematology care.  If you have a lab appointment with the Cancer Center - please note that after April 8th, 2024, all labs will be drawn in the cancer center.  You do not have to check in or register with the main entrance as you have in the past but will complete your check-in in the cancer center.  Wear comfortable clothing and clothing appropriate for easy access to any Portacath or PICC line.   We strive to give you quality time with your provider. You may need to reschedule your appointment if you arrive late (15 or more minutes).  Arriving late affects you and other patients whose appointments are after yours.  Also, if you miss three or more appointments without notifying the office, you may be dismissed from the clinic at the provider's discretion.      For prescription refill requests, have your pharmacy contact our office and allow 72 hours for refills to be completed.    Today you received the following chemotherapy and/or immunotherapy agents Vidaza, return as scheduled.   To help prevent nausea and vomiting after your treatment, we encourage you to take your nausea medication as directed.  BELOW ARE SYMPTOMS THAT SHOULD BE REPORTED IMMEDIATELY: *FEVER GREATER THAN 100.4 F (38 C) OR HIGHER *CHILLS OR SWEATING *NAUSEA AND VOMITING THAT IS NOT CONTROLLED WITH YOUR NAUSEA MEDICATION *UNUSUAL SHORTNESS OF BREATH *UNUSUAL BRUISING OR BLEEDING *URINARY PROBLEMS (pain or burning when urinating, or frequent urination) *BOWEL PROBLEMS (unusual diarrhea, constipation, pain near the anus) TENDERNESS IN MOUTH AND THROAT WITH OR WITHOUT PRESENCE OF ULCERS (sore throat, sores in mouth, or a toothache) UNUSUAL RASH, SWELLING OR PAIN  UNUSUAL VAGINAL DISCHARGE OR ITCHING   Items with * indicate a potential emergency and  should be followed up as soon as possible or go to the Emergency Department if any problems should occur.  Please show the CHEMOTHERAPY ALERT CARD or IMMUNOTHERAPY ALERT CARD at check-in to the Emergency Department and triage nurse.  Should you have questions after your visit or need to cancel or reschedule your appointment, please contact [facility]  - A DEPT OF [facility] [phone]  and follow the prompts.  Office hours are 8:00 a.m. to 4:30 p.m. Monday - Friday. Please note that voicemails left after 4:00 p.m. may not be returned until the following business day.  We are closed weekends and major holidays. You have access to a nurse at all times for urgent questions. Please call the main number to the clinic [phone] and follow the prompts.  For any non-urgent questions, you may also contact your provider using MyChart. We now offer e-Visits for anyone 68 and older to request care online for non-urgent symptoms. For details visit [email or web address].   Also download the MyChart app! Go to the app store, search "MyChart", open the app, select [place], and log in with your MyChart username and password.

## 2023-12-14 ENCOUNTER — Other Ambulatory Visit: Payer: Self-pay

## 2023-12-14 ENCOUNTER — Inpatient Hospital Stay

## 2023-12-14 VITALS — BP 109/59 | HR 80 | Temp 97.5°F | Resp 18

## 2023-12-14 DIAGNOSIS — Z5111 Encounter for antineoplastic chemotherapy: Secondary | ICD-10-CM | POA: Diagnosis not present

## 2023-12-14 DIAGNOSIS — K5900 Constipation, unspecified: Secondary | ICD-10-CM | POA: Diagnosis not present

## 2023-12-14 DIAGNOSIS — D696 Thrombocytopenia, unspecified: Secondary | ICD-10-CM | POA: Diagnosis not present

## 2023-12-14 DIAGNOSIS — Z881 Allergy status to other antibiotic agents status: Secondary | ICD-10-CM | POA: Diagnosis not present

## 2023-12-14 DIAGNOSIS — Z882 Allergy status to sulfonamides status: Secondary | ICD-10-CM | POA: Diagnosis not present

## 2023-12-14 DIAGNOSIS — C9310 Chronic myelomonocytic leukemia not having achieved remission: Secondary | ICD-10-CM | POA: Diagnosis not present

## 2023-12-14 DIAGNOSIS — Z79899 Other long term (current) drug therapy: Secondary | ICD-10-CM | POA: Diagnosis not present

## 2023-12-14 DIAGNOSIS — D7581 Myelofibrosis: Secondary | ICD-10-CM | POA: Diagnosis not present

## 2023-12-14 DIAGNOSIS — E538 Deficiency of other specified B group vitamins: Secondary | ICD-10-CM | POA: Diagnosis not present

## 2023-12-14 DIAGNOSIS — Z95828 Presence of other vascular implants and grafts: Secondary | ICD-10-CM

## 2023-12-14 MED ORDER — SODIUM CHLORIDE 0.9 % IV SOLN
75.0000 mg/m2 | Freq: Once | INTRAVENOUS | Status: AC
  Administered 2023-12-14: 126 mg via INTRAVENOUS
  Filled 2023-12-14: qty 12.6

## 2023-12-14 MED ORDER — SODIUM CHLORIDE 0.9 % IV SOLN: Freq: Once | INTRAVENOUS | Status: AC

## 2023-12-14 MED ORDER — HEPARIN SOD (PORK) LOCK FLUSH 100 UNIT/ML IV SOLN
500.0000 [IU] | Freq: Once | INTRAVENOUS | Status: AC
  Administered 2023-12-14: 500 [IU] via INTRAVENOUS

## 2023-12-14 MED ORDER — SODIUM CHLORIDE 0.9% FLUSH
10.0000 mL | Freq: Once | INTRAVENOUS | Status: AC
  Administered 2023-12-14: 10 mL via INTRAVENOUS

## 2023-12-14 NOTE — Patient Instructions (Signed)
 [facility] - A DEPT OF [facility]  Discharge Instructions: Thank you for choosing [facility] to provide your oncology and hematology care.  If you have a lab appointment with the Cancer Center - please note that after April 8th, 2024, all labs will be drawn in the cancer center.  You do not have to check in or register with the main entrance as you have in the past but will complete your check-in in the cancer center.  Wear comfortable clothing and clothing appropriate for easy access to any Portacath or PICC line.   We strive to give you quality time with your provider. You may need to reschedule your appointment if you arrive late (15 or more minutes).  Arriving late affects you and other patients whose appointments are after yours.  Also, if you miss three or more appointments without notifying the office, you may be dismissed from the clinic at the provider's discretion.      For prescription refill requests, have your pharmacy contact our office and allow 72 hours for refills to be completed.    Today you received the following chemotherapy and/or immunotherapy agents Vidaza, return as scheduled.   To help prevent nausea and vomiting after your treatment, we encourage you to take your nausea medication as directed.  BELOW ARE SYMPTOMS THAT SHOULD BE REPORTED IMMEDIATELY: *FEVER GREATER THAN 100.4 F (38 C) OR HIGHER *CHILLS OR SWEATING *NAUSEA AND VOMITING THAT IS NOT CONTROLLED WITH YOUR NAUSEA MEDICATION *UNUSUAL SHORTNESS OF BREATH *UNUSUAL BRUISING OR BLEEDING *URINARY PROBLEMS (pain or burning when urinating, or frequent urination) *BOWEL PROBLEMS (unusual diarrhea, constipation, pain near the anus) TENDERNESS IN MOUTH AND THROAT WITH OR WITHOUT PRESENCE OF ULCERS (sore throat, sores in mouth, or a toothache) UNUSUAL RASH, SWELLING OR PAIN  UNUSUAL VAGINAL DISCHARGE OR ITCHING   Items with * indicate a potential emergency and  should be followed up as soon as possible or go to the Emergency Department if any problems should occur.  Please show the CHEMOTHERAPY ALERT CARD or IMMUNOTHERAPY ALERT CARD at check-in to the Emergency Department and triage nurse.  Should you have questions after your visit or need to cancel or reschedule your appointment, please contact [facility]  - A DEPT OF [facility] [phone]  and follow the prompts.  Office hours are 8:00 a.m. to 4:30 p.m. Monday - Friday. Please note that voicemails left after 4:00 p.m. may not be returned until the following business day.  We are closed weekends and major holidays. You have access to a nurse at all times for urgent questions. Please call the main number to the clinic [phone] and follow the prompts.  For any non-urgent questions, you may also contact your provider using MyChart. We now offer e-Visits for anyone 68 and older to request care online for non-urgent symptoms. For details visit [email or web address].   Also download the MyChart app! Go to the app store, search "MyChart", open the app, select [place], and log in with your MyChart username and password.

## 2023-12-14 NOTE — Progress Notes (Signed)
Patient tolerated chemotherapy with no complaints voiced. Side effects with management reviewed understanding verbalized. Port site clean and dry with no bruising or swelling noted at site. Good blood return noted before and after administration of chemotherapy. Band aid applied. Patient left in satisfactory condition with VSS and no s/s of distress noted. 

## 2023-12-15 ENCOUNTER — Inpatient Hospital Stay

## 2023-12-15 VITALS — BP 111/64 | HR 72 | Temp 97.2°F | Resp 18

## 2023-12-15 DIAGNOSIS — Z882 Allergy status to sulfonamides status: Secondary | ICD-10-CM | POA: Diagnosis not present

## 2023-12-15 DIAGNOSIS — Z79899 Other long term (current) drug therapy: Secondary | ICD-10-CM | POA: Diagnosis not present

## 2023-12-15 DIAGNOSIS — C9310 Chronic myelomonocytic leukemia not having achieved remission: Secondary | ICD-10-CM | POA: Diagnosis not present

## 2023-12-15 DIAGNOSIS — D696 Thrombocytopenia, unspecified: Secondary | ICD-10-CM | POA: Diagnosis not present

## 2023-12-15 DIAGNOSIS — K5900 Constipation, unspecified: Secondary | ICD-10-CM | POA: Diagnosis not present

## 2023-12-15 DIAGNOSIS — D7581 Myelofibrosis: Secondary | ICD-10-CM | POA: Diagnosis not present

## 2023-12-15 DIAGNOSIS — Z5111 Encounter for antineoplastic chemotherapy: Secondary | ICD-10-CM | POA: Diagnosis not present

## 2023-12-15 DIAGNOSIS — E538 Deficiency of other specified B group vitamins: Secondary | ICD-10-CM | POA: Diagnosis not present

## 2023-12-15 DIAGNOSIS — Z881 Allergy status to other antibiotic agents status: Secondary | ICD-10-CM | POA: Diagnosis not present

## 2023-12-15 DIAGNOSIS — Z95828 Presence of other vascular implants and grafts: Secondary | ICD-10-CM

## 2023-12-15 MED ORDER — SODIUM CHLORIDE 0.9 % IV SOLN: Freq: Once | INTRAVENOUS | Status: AC

## 2023-12-15 MED ORDER — SODIUM CHLORIDE 0.9% FLUSH
10.0000 mL | Freq: Once | INTRAVENOUS | Status: AC
  Administered 2023-12-15: 10 mL via INTRAVENOUS

## 2023-12-15 MED ORDER — PALONOSETRON HCL INJECTION 0.25 MG/5ML
0.2500 mg | Freq: Once | INTRAVENOUS | Status: AC
  Administered 2023-12-15: 0.25 mg via INTRAVENOUS
  Filled 2023-12-15: qty 5

## 2023-12-15 MED ORDER — HEPARIN SOD (PORK) LOCK FLUSH 100 UNIT/ML IV SOLN
500.0000 [IU] | Freq: Once | INTRAVENOUS | Status: AC
  Administered 2023-12-15: 500 [IU] via INTRAVENOUS

## 2023-12-15 MED ORDER — SODIUM CHLORIDE 0.9 % IV SOLN
75.0000 mg/m2 | Freq: Once | INTRAVENOUS | Status: AC
  Administered 2023-12-15: 126 mg via INTRAVENOUS
  Filled 2023-12-15: qty 12.6

## 2023-12-15 MED FILL — Azacitidine For Inj 100 MG: INTRAMUSCULAR | Qty: 12.6 | Status: AC

## 2023-12-15 NOTE — Progress Notes (Signed)
 Patient presents today for Vidaza  infusion. Vital signs within parameters for treatment. Heart rate elevated on arrival 107. Recheck 85.   Treatment given today per MD orders. Tolerated infusion without adverse affects. Vital signs stable. No complaints at this time. Discharged from clinic ambulatory in stable condition. Alert and oriented x 3. F/U with [facility] as scheduled.

## 2023-12-15 NOTE — Patient Instructions (Signed)
 [facility] [place] - A DEPT OF [facility]  Discharge Instructions: Thank you for choosing [facility] to provide your oncology and hematology care.  If you have a lab appointment with the Cancer Center - please note that after April 8th, 2024, all labs will be drawn in the cancer center.  You do not have to check in or register with the main entrance as you have in the past but will complete your check-in in the cancer center.  Wear comfortable clothing and clothing appropriate for easy access to any Portacath or PICC line.   We strive to give you quality time with your provider. You may need to reschedule your appointment if you arrive late (15 or more minutes).  Arriving late affects you and other patients whose appointments are after yours.  Also, if you miss three or more appointments without notifying the office, you may be dismissed from the clinic at the provider's discretion.      For prescription refill requests, have your pharmacy contact our office and allow 72 hours for refills to be completed.    Today you received the following chemotherapy and/or immunotherapy agents Vidaza.  Azacitidine Injection What is this medication? AZACITIDINE (ay za SITE i deen) treats blood and bone marrow cancers. It works by slowing down the growth of cancer cells. This medicine may be used for other purposes; ask your health care provider or pharmacist if you have questions. COMMON BRAND NAME(S): Vidaza What should I tell my care team before I take this medication? They need to know if you have any of these conditions: Kidney disease Liver disease Low blood cell levels, such as low white cells, platelets, or red blood cells Low levels of albumin in the blood Low levels of bicarbonate in the blood An unusual or allergic reaction to azacitidine, mannitol, other medications, foods, dyes, or preservatives If you or your partner are pregnant or trying to get  pregnant Breast-feeding How should I use this medication? This medication is injected into a vein or under the skin. It is given by your care team in a hospital or clinic setting. Talk to your care team about the use of this medication in children. While it may be prescribed for children as young as 1 month for selected conditions, precautions do apply. Overdosage: If you think you have taken too much of this medicine contact a poison control center or emergency room at once. NOTE: This medicine is only for you. Do not share this medicine with others. What if I miss a dose? Keep appointments for follow-up doses. It is important not to miss your dose. Call your care team if you are unable to keep an appointment. What may interact with this medication? Interactions are not expected. This list may not describe all possible interactions. Give your health care provider a list of all the medicines, herbs, non-prescription drugs, or dietary supplements you use. Also tell them if you smoke, drink alcohol, or use illegal drugs. Some items may interact with your medicine. What should I watch for while using this medication? Your condition will be monitored carefully while you are receiving this medication. This medication may make you feel generally unwell. This is not uncommon as chemotherapy can affect healthy cells as well as cancer cells. Report any side effects. Continue your course of treatment even though you feel ill unless your care team tells you to stop. You may need blood work done while you are  taking this medication. Other product types may be available that contain the medication azacitidine. The injection and oral products should not be used in place of one another. Talk to your care team if you have questions. This medication can cause serious side effects. To reduce the risk, your care team may give you other medications to take before receiving this one. Be sure to follow the directions from  your care team. This medication may increase your risk of getting an infection. Call your care team for advice if you get a fever, chills, sore throat, or other symptoms of a cold or flu. Do not treat yourself. Try to avoid being around people who are sick. Avoid taking medications that contain aspirin, acetaminophen, ibuprofen, naproxen, or ketoprofen unless instructed by your care team. These medications may hide a fever. Be careful brushing or flossing your teeth or using a toothpick because you may get an infection or bleed more easily. If you have any dental work done, tell your dentist you are receiving this medication. Talk to your care team if you or your partner may be pregnant. Serious birth defects can occur if you take this medication during pregnancy and for 6 months after the last dose. You will need a negative pregnancy test before starting this medication. Contraception is recommended while taking this medication and for 6 months after the last dose. Your care team can help you find the option that works for you. If your partner can get pregnant, use a condom during sex while taking this medication and for 3 months after the last dose. Do not breastfeed while taking this medication and for 1 week after the last dose. This medication may cause infertility. Talk to your care team if you are concerned about your fertility. What side effects may I notice from receiving this medication? Side effects that you should report to your care team as soon as possible: Allergic reactions--skin rash, itching, hives, swelling of the face, lips, tongue, or throat Infection--fever, chills, cough, sore throat, wounds that don't heal, pain or trouble when passing urine, general feeling of discomfort or being unwell Kidney injury--decrease in the amount of urine, swelling of the ankles, hands, or feet Liver injury--right upper belly pain, loss of appetite, nausea, light-colored stool, dark yellow or [name]  urine, yellowing skin or eyes, unusual weakness or fatigue Low red blood cell level--unusual weakness or fatigue, dizziness, headache, trouble breathing Tumor lysis syndrome (TLS)--nausea, vomiting, diarrhea, decrease in the amount of urine, dark urine, unusual weakness or fatigue, confusion, muscle pain or cramps, fast or irregular heartbeat, joint pain Unusual bruising or bleeding Side effects that usually do not require medical attention (report to your care team if they continue or are bothersome): Constipation Diarrhea Nausea Pain, redness, or irritation at injection site Vomiting This list may not describe all possible side effects. Call your doctor for medical advice about side effects. You may report side effects to FDA at 1-800-FDA-1088. Where should I keep my medication? This medication is given in a hospital or clinic. It will not be stored at home. NOTE: This sheet is a summary. It may not cover all possible information. If you have questions about this medicine, talk to your doctor, pharmacist, or health care provider.  2024 Elsevier/Gold Standard (2023-03-07 00:00:00)       To help prevent nausea and vomiting after your treatment, we encourage you to take your nausea medication as directed.  BELOW ARE SYMPTOMS THAT SHOULD BE REPORTED IMMEDIATELY: *FEVER GREATER THAN 100.4  F (38 C) OR HIGHER *CHILLS OR SWEATING *NAUSEA AND VOMITING THAT IS NOT CONTROLLED WITH YOUR NAUSEA MEDICATION *UNUSUAL SHORTNESS OF BREATH *UNUSUAL BRUISING OR BLEEDING *URINARY PROBLEMS (pain or burning when urinating, or frequent urination) *BOWEL PROBLEMS (unusual diarrhea, constipation, pain near the anus) TENDERNESS IN MOUTH AND THROAT WITH OR WITHOUT PRESENCE OF ULCERS (sore throat, sores in mouth, or a toothache) UNUSUAL RASH, SWELLING OR PAIN  UNUSUAL VAGINAL DISCHARGE OR ITCHING   Items with * indicate a potential emergency and should be followed up as soon as possible or go to the Emergency  Department if any problems should occur.  Please show the CHEMOTHERAPY ALERT CARD or IMMUNOTHERAPY ALERT CARD at check-in to the Emergency Department and triage nurse.  Should you have questions after your visit or need to cancel or reschedule your appointment, please contact [facility] [place] - A DEPT OF [facility] [phone]  and follow the prompts.  Office hours are 8:00 a.m. to 4:30 p.m. Monday - Friday. Please note that voicemails left after 4:00 p.m. may not be returned until the following business day.  We are closed weekends and major holidays. You have access to a nurse at all times for urgent questions. Please call the main number to the clinic [phone] and follow the prompts.  For any non-urgent questions, you may also contact your provider using MyChart. We now offer e-Visits for anyone 88 and older to request care online for non-urgent symptoms. For details visit [email or web address].   Also download the MyChart app! Go to the app store, search "MyChart", open the app, select [place], and log in with your MyChart username and password.

## 2023-12-16 ENCOUNTER — Inpatient Hospital Stay

## 2023-12-16 VITALS — BP 105/52 | HR 72 | Temp 98.4°F | Resp 18

## 2023-12-16 DIAGNOSIS — Z95828 Presence of other vascular implants and grafts: Secondary | ICD-10-CM

## 2023-12-16 DIAGNOSIS — Z5111 Encounter for antineoplastic chemotherapy: Secondary | ICD-10-CM | POA: Diagnosis not present

## 2023-12-16 DIAGNOSIS — E538 Deficiency of other specified B group vitamins: Secondary | ICD-10-CM | POA: Diagnosis not present

## 2023-12-16 DIAGNOSIS — C9310 Chronic myelomonocytic leukemia not having achieved remission: Secondary | ICD-10-CM | POA: Diagnosis not present

## 2023-12-16 DIAGNOSIS — Z881 Allergy status to other antibiotic agents status: Secondary | ICD-10-CM | POA: Diagnosis not present

## 2023-12-16 DIAGNOSIS — K5900 Constipation, unspecified: Secondary | ICD-10-CM | POA: Diagnosis not present

## 2023-12-16 DIAGNOSIS — Z79899 Other long term (current) drug therapy: Secondary | ICD-10-CM | POA: Diagnosis not present

## 2023-12-16 DIAGNOSIS — D696 Thrombocytopenia, unspecified: Secondary | ICD-10-CM | POA: Diagnosis not present

## 2023-12-16 DIAGNOSIS — D7581 Myelofibrosis: Secondary | ICD-10-CM | POA: Diagnosis not present

## 2023-12-16 DIAGNOSIS — Z882 Allergy status to sulfonamides status: Secondary | ICD-10-CM | POA: Diagnosis not present

## 2023-12-16 MED ORDER — HEPARIN SOD (PORK) LOCK FLUSH 100 UNIT/ML IV SOLN
500.0000 [IU] | Freq: Once | INTRAVENOUS | Status: AC
  Administered 2023-12-16: 500 [IU] via INTRAVENOUS

## 2023-12-16 MED ORDER — SODIUM CHLORIDE 0.9 % IV SOLN
75.0000 mg/m2 | Freq: Once | INTRAVENOUS | Status: AC
  Administered 2023-12-16: 126 mg via INTRAVENOUS
  Filled 2023-12-16: qty 12.6

## 2023-12-16 MED ORDER — SODIUM CHLORIDE 0.9 % IV SOLN: Freq: Once | INTRAVENOUS | Status: AC

## 2023-12-16 MED ORDER — SODIUM CHLORIDE 0.9% FLUSH
10.0000 mL | Freq: Once | INTRAVENOUS | Status: AC
  Administered 2023-12-16: 10 mL via INTRAVENOUS

## 2023-12-16 NOTE — Patient Instructions (Signed)
 [facility] - A DEPT OF [facility]  Discharge Instructions: Thank you for choosing [facility] to provide your oncology and hematology care.  If you have a lab appointment with the Cancer Center - please note that after April 8th, 2024, all labs will be drawn in the cancer center.  You do not have to check in or register with the main entrance as you have in the past but will complete your check-in in the cancer center.  Wear comfortable clothing and clothing appropriate for easy access to any Portacath or PICC line.   We strive to give you quality time with your provider. You may need to reschedule your appointment if you arrive late (15 or more minutes).  Arriving late affects you and other patients whose appointments are after yours.  Also, if you miss three or more appointments without notifying the office, you may be dismissed from the clinic at the provider's discretion.      For prescription refill requests, have your pharmacy contact our office and allow 72 hours for refills to be completed.    Today you received the following chemotherapy and/or immunotherapy agents D5 Vidaza    To help prevent nausea and vomiting after your treatment, we encourage you to take your nausea medication as directed.  BELOW ARE SYMPTOMS THAT SHOULD BE REPORTED IMMEDIATELY: *FEVER GREATER THAN 100.4 F (38 C) OR HIGHER *CHILLS OR SWEATING *NAUSEA AND VOMITING THAT IS NOT CONTROLLED WITH YOUR NAUSEA MEDICATION *UNUSUAL SHORTNESS OF BREATH *UNUSUAL BRUISING OR BLEEDING *URINARY PROBLEMS (pain or burning when urinating, or frequent urination) *BOWEL PROBLEMS (unusual diarrhea, constipation, pain near the anus) TENDERNESS IN MOUTH AND THROAT WITH OR WITHOUT PRESENCE OF ULCERS (sore throat, sores in mouth, or a toothache) UNUSUAL RASH, SWELLING OR PAIN  UNUSUAL VAGINAL DISCHARGE OR ITCHING   Items with * indicate a potential emergency and should be followed up as  soon as possible or go to the Emergency Department if any problems should occur.  Please show the CHEMOTHERAPY ALERT CARD or IMMUNOTHERAPY ALERT CARD at check-in to the Emergency Department and triage nurse.  Should you have questions after your visit or need to cancel or reschedule your appointment, please contact [facility] [place] - A DEPT OF [facility] [phone]  and follow the prompts.  Office hours are 8:00 a.m. to 4:30 p.m. Monday - Friday. Please note that voicemails left after 4:00 p.m. may not be returned until the following business day.  We are closed weekends and major holidays. You have access to a nurse at all times for urgent questions. Please call the main number to the clinic [phone] and follow the prompts.  For any non-urgent questions, you may also contact your provider using MyChart. We now offer e-Visits for anyone 102 and older to request care online for non-urgent symptoms. For details visit [email or web address].   Also download the MyChart app! Go to the app store, search "MyChart", open the app, select [facility], and log in with your MyChart username and password.

## 2023-12-16 NOTE — Progress Notes (Signed)
 Patient presents today for D5 Vidaza  per provider's orders. Vital signs stable and patient voiced no new complaints at this time.  Treatment given today per MD orders. Tolerated infusion without adverse affects. Vital signs stable. No complaints at this time. Discharged from clinic ambulatory in stable condition. Alert and oriented x 3. F/U with [facility] as scheduled.

## 2023-12-19 ENCOUNTER — Inpatient Hospital Stay: Attending: Hematology

## 2023-12-19 ENCOUNTER — Inpatient Hospital Stay

## 2023-12-19 ENCOUNTER — Other Ambulatory Visit: Payer: Self-pay

## 2023-12-19 ENCOUNTER — Inpatient Hospital Stay: Admitting: Dietician

## 2023-12-19 VITALS — BP 128/73 | HR 64 | Temp 98.1°F | Resp 18

## 2023-12-19 DIAGNOSIS — Z79899 Other long term (current) drug therapy: Secondary | ICD-10-CM | POA: Insufficient documentation

## 2023-12-19 DIAGNOSIS — D696 Thrombocytopenia, unspecified: Secondary | ICD-10-CM | POA: Diagnosis not present

## 2023-12-19 DIAGNOSIS — Z5111 Encounter for antineoplastic chemotherapy: Secondary | ICD-10-CM | POA: Insufficient documentation

## 2023-12-19 DIAGNOSIS — C9310 Chronic myelomonocytic leukemia not having achieved remission: Secondary | ICD-10-CM

## 2023-12-19 DIAGNOSIS — Z95828 Presence of other vascular implants and grafts: Secondary | ICD-10-CM

## 2023-12-19 LAB — CBC WITH DIFFERENTIAL/PLATELET
Abs Immature Granulocytes: 0.2 10*3/uL — ABNORMAL HIGH (ref 0.00–0.07)
Band Neutrophils: 1 %
Basophils Absolute: 0 10*3/uL (ref 0.0–0.1)
Basophils Relative: 1 %
Eosinophils Absolute: 0.1 10*3/uL (ref 0.0–0.5)
Eosinophils Relative: 5 %
HCT: 26.4 % — ABNORMAL LOW (ref 36.0–46.0)
Hemoglobin: 7.9 g/dL — ABNORMAL LOW (ref 12.0–15.0)
Lymphocytes Relative: 24 %
Lymphs Abs: 0.6 10*3/uL — ABNORMAL LOW (ref 0.7–4.0)
MCH: 29.3 pg (ref 26.0–34.0)
MCHC: 29.9 g/dL — ABNORMAL LOW (ref 30.0–36.0)
MCV: 97.8 fL (ref 80.0–100.0)
Metamyelocytes Relative: 3 %
Monocytes Absolute: 0.2 10*3/uL (ref 0.1–1.0)
Monocytes Relative: 8 %
Myelocytes: 4 %
Neutro Abs: 1.4 10*3/uL — ABNORMAL LOW (ref 1.7–7.7)
Neutrophils Relative %: 53 %
Platelets: 91 10*3/uL — ABNORMAL LOW (ref 150–400)
Promyelocytes Relative: 1 %
RBC: 2.7 MIL/uL — ABNORMAL LOW (ref 3.87–5.11)
RDW: 19.6 % — ABNORMAL HIGH (ref 11.5–15.5)
WBC: 2.6 10*3/uL — ABNORMAL LOW (ref 4.0–10.5)
nRBC: 1 /100{WBCs} — ABNORMAL HIGH
nRBC: 3.4 % — ABNORMAL HIGH (ref 0.0–0.2)

## 2023-12-19 LAB — COMPREHENSIVE METABOLIC PANEL WITH GFR
ALT: 11 U/L (ref 0–44)
AST: 21 U/L (ref 15–41)
Albumin: 3.7 g/dL (ref 3.5–5.0)
Alkaline Phosphatase: 56 U/L (ref 38–126)
Anion gap: 7 (ref 5–15)
BUN: 10 mg/dL (ref 8–23)
CO2: 27 mmol/L (ref 22–32)
Calcium: 9.1 mg/dL (ref 8.9–10.3)
Chloride: 106 mmol/L (ref 98–111)
Creatinine, Ser: 0.74 mg/dL (ref 0.44–1.00)
GFR, Estimated: >60 mL/min (ref >60)
Glucose, Bld: 97 mg/dL (ref 70–99)
Potassium: 3.8 mmol/L (ref 3.5–5.1)
Sodium: 140 mmol/L (ref 135–145)
Total Bilirubin: 0.9 mg/dL (ref 0.0–1.2)
Total Protein: 6.7 g/dL (ref 6.5–8.1)

## 2023-12-19 LAB — MAGNESIUM: Magnesium: 1.9 mg/dL (ref 1.7–2.4)

## 2023-12-19 LAB — PREPARE RBC (CROSSMATCH)

## 2023-12-19 MED ORDER — SODIUM CHLORIDE 0.9% FLUSH
10.0000 mL | Freq: Once | INTRAVENOUS | Status: AC
  Administered 2023-12-19: 10 mL via INTRAVENOUS

## 2023-12-19 MED ORDER — HEPARIN SOD (PORK) LOCK FLUSH 100 UNIT/ML IV SOLN
500.0000 [IU] | Freq: Once | INTRAVENOUS | Status: AC
  Administered 2023-12-19: 500 [IU] via INTRAVENOUS

## 2023-12-19 MED ORDER — ACETAMINOPHEN 325 MG PO TABS
650.0000 mg | ORAL_TABLET | Freq: Once | ORAL | Status: AC
  Administered 2023-12-19: 650 mg via ORAL
  Filled 2023-12-19: qty 2

## 2023-12-19 MED ORDER — DIPHENHYDRAMINE HCL 25 MG PO CAPS
25.0000 mg | ORAL_CAPSULE | Freq: Once | ORAL | Status: AC
  Administered 2023-12-19: 25 mg via ORAL
  Filled 2023-12-19: qty 1

## 2023-12-19 MED ORDER — PALONOSETRON HCL INJECTION 0.25 MG/5ML
0.2500 mg | Freq: Once | INTRAVENOUS | Status: AC
  Administered 2023-12-19: 0.25 mg via INTRAVENOUS
  Filled 2023-12-19: qty 5

## 2023-12-19 MED ORDER — SODIUM CHLORIDE 0.9 % IV SOLN
75.0000 mg/m2 | Freq: Once | INTRAVENOUS | Status: AC
  Administered 2023-12-19: 126 mg via INTRAVENOUS
  Filled 2023-12-19: qty 12.6

## 2023-12-19 MED ORDER — SODIUM CHLORIDE 0.9 % IV SOLN: Freq: Once | INTRAVENOUS | Status: AC

## 2023-12-19 MED ORDER — AMOXICILLIN-POT CLAVULANATE 875-125 MG PO TABS: 1.0000 | ORAL_TABLET | Freq: Two times a day (BID) | ORAL | 0 refills | Status: DC

## 2023-12-19 MED ORDER — SODIUM CHLORIDE 0.9% IV SOLUTION
250.0000 mL | INTRAVENOUS | Status: DC
  Administered 2023-12-19: 250 mL via INTRAVENOUS

## 2023-12-19 NOTE — Patient Instructions (Signed)
 [facility] - A DEPT OF [place]  Discharge Instructions: Thank you for choosing [facility] to provide your oncology and hematology care.  If you have a lab appointment with the Cancer Center - please note that after April 8th, 2024, all labs will be drawn in the cancer center.  You do not have to check in or register with the main entrance as you have in the past but will complete your check-in in the cancer center.  Wear comfortable clothing and clothing appropriate for easy access to any Portacath or PICC line.   We strive to give you quality time with your provider. You may need to reschedule your appointment if you arrive late (15 or more minutes).  Arriving late affects you and other patients whose appointments are after yours.  Also, if you miss three or more appointments without notifying the office, you may be dismissed from the clinic at the provider's discretion.      For prescription refill requests, have your pharmacy contact our office and allow 72 hours for refills to be completed.    Today you received the following chemotherapy and/or immunotherapy agents Vidaza .  Blood Transfusion Information In this video, you will learn about the benefits and risks of a blood transfusion. To view the content, go to this web address: [email or web address]  This video will expire on: 06/29/2025. If you need access to this video following this date, please reach out to the healthcare provider who assigned it to you. This information is not intended to replace advice given to you by your health care provider. Make sure you discuss any questions you have with your health care provider. Elsevier Patient Education  2024 Elsevier Inc.   Azacitidine  Injection What is this medication? AZACITIDINE  (ay [employer] i deen) treats blood and bone marrow cancers. It works by slowing down the growth of cancer cells. This medicine may be used for  other purposes; ask your health care provider or pharmacist if you have questions. COMMON BRAND NAME(S): Vidaza  What should I tell my care team before I take this medication? They need to know if you have any of these conditions: Kidney disease Liver disease Low blood cell levels, such as low white cells, platelets, or red blood cells Low levels of albumin in the blood Low levels of bicarbonate in the blood An unusual or allergic reaction to azacitidine , mannitol, other medications, foods, dyes, or preservatives If you or your partner are pregnant or trying to get pregnant Breast-feeding How should I use this medication? This medication is injected into a vein or under the skin. It is given by your care team in a hospital or clinic setting. Talk to your care team about the use of this medication in children. While it may be prescribed for children as young as 1 month for selected conditions, precautions do apply. Overdosage: If you think you have taken too much of this medicine contact a poison control center or emergency room at once. NOTE: This medicine is only for you. Do not share this medicine with others. What if I miss a dose? Keep appointments for follow-up doses. It is important not to miss your dose. Call your care team if you are unable to keep an appointment. What may interact with this medication? Interactions are not expected. This list may not describe all possible interactions. Give your health care provider a list of all the medicines, herbs, non-prescription drugs, or dietary supplements you  use. Also tell them if you smoke, drink alcohol, or use illegal drugs. Some items may interact with your medicine. What should I watch for while using this medication? Your condition will be monitored carefully while you are receiving this medication. This medication may make you feel generally unwell. This is not uncommon as chemotherapy can affect healthy cells as well as cancer cells.  Report any side effects. Continue your course of treatment even though you feel ill unless your care team tells you to stop. You may need blood work done while you are taking this medication. Other product types may be available that contain the medication azacitidine . The injection and oral products should not be used in place of one another. Talk to your care team if you have questions. This medication can cause serious side effects. To reduce the risk, your care team may give you other medications to take before receiving this one. Be sure to follow the directions from your care team. This medication may increase your risk of getting an infection. Call your care team for advice if you get a fever, chills, sore throat, or other symptoms of a cold or flu. Do not treat yourself. Try to avoid being around people who are sick. Avoid taking medications that contain aspirin, acetaminophen , ibuprofen, naproxen , or ketoprofen unless instructed by your care team. These medications may hide a fever. Be careful brushing or flossing your teeth or using a toothpick because you may get an infection or bleed more easily. If you have any dental work done, tell your dentist you are receiving this medication. Talk to your care team if you or your partner may be pregnant. Serious birth defects can occur if you take this medication during pregnancy and for 6 months after the last dose. You will need a negative pregnancy test before starting this medication. Contraception is recommended while taking this medication and for 6 months after the last dose. Your care team can help you find the option that works for you. If your partner can get pregnant, use a condom during sex while taking this medication and for 3 months after the last dose. Do not breastfeed while taking this medication and for 1 week after the last dose. This medication may cause infertility. Talk to your care team if you are concerned about your  fertility. What side effects may I notice from receiving this medication? Side effects that you should report to your care team as soon as possible: Allergic reactions--skin rash, itching, hives, swelling of the face, lips, tongue, or throat Infection--fever, chills, cough, sore throat, wounds that don't heal, pain or trouble when passing urine, general feeling of discomfort or being unwell Kidney injury--decrease in the amount of urine, swelling of the ankles, hands, or feet Liver injury--right upper belly pain, loss of appetite, nausea, light-colored stool, dark yellow or [place]ak urine, yellowing skin or eyes, unusual weakness or fatigue Low red blood cell level--unusual weakness or fatigue, dizziness, headache, trouble breathing Tumor lysis syndrome (TLS)--nausea, vomiting, diarrhea, decrease in the amount of urine, dark urine, unusual weakness or fatigue, confusion, muscle pain or cramps, fast or irregular heartbeat, joint pain Unusual bruising or bleeding Side effects that usually do not require medical attention (report to your care team if they continue or are bothersome): Constipation Diarrhea Nausea Pain, redness, or irritation at injection site Vomiting This list may not describe all possible side effects. Call your doctor for medical advice about side effects. You may report side effects to FDA at 1-800-FDA-1088.  Where should I keep my medication? This medication is given in a hospital or clinic. It will not be stored at home. NOTE: This sheet is a summary. It may not cover all possible information. If you have questions about this medicine, talk to your doctor, pharmacist, or health care provider.  2024 Elsevier/Gold Standard (2023-03-07 00:00:00)       To help prevent nausea and vomiting after your treatment, we encourage you to take your nausea medication as directed.  BELOW ARE SYMPTOMS THAT SHOULD BE REPORTED IMMEDIATELY: *FEVER GREATER THAN 100.4 F (38 C) OR HIGHER *CHILLS  OR SWEATING *NAUSEA AND VOMITING THAT IS NOT CONTROLLED WITH YOUR NAUSEA MEDICATION *UNUSUAL SHORTNESS OF BREATH *UNUSUAL BRUISING OR BLEEDING *URINARY PROBLEMS (pain or burning when urinating, or frequent urination) *BOWEL PROBLEMS (unusual diarrhea, constipation, pain near the anus) TENDERNESS IN MOUTH AND THROAT WITH OR WITHOUT PRESENCE OF ULCERS (sore throat, sores in mouth, or a toothache) UNUSUAL RASH, SWELLING OR PAIN  UNUSUAL VAGINAL DISCHARGE OR ITCHING   Items with * indicate a potential emergency and should be followed up as soon as possible or go to the Emergency Department if any problems should occur.  Please show the CHEMOTHERAPY ALERT CARD or IMMUNOTHERAPY ALERT CARD at check-in to the Emergency Department and triage nurse.  Should you have questions after your visit or need to cancel or reschedule your appointment, please contact [facility] [place] - A DEPT OF [facility] [phone]  and follow the prompts.  Office hours are 8:00 a.m. to 4:30 p.m. Monday - Friday. Please note that voicemails left after 4:00 p.m. may not be returned until the following business day.  We are closed weekends and major holidays. You have access to a nurse at all times for urgent questions. Please call the main number to the clinic [phone] and follow the prompts.  For any non-urgent questions, you may also contact your provider using MyChart. We now offer e-Visits for anyone 61 and older to request care online for non-urgent symptoms. For details visit [email or web address].   Also download the MyChart app! Go to the app store, search "MyChart", open the app, select [facility], and log in with your MyChart username and password.

## 2023-12-19 NOTE — Progress Notes (Signed)
 Patient presents today for chemotherapy infusion.  Patient is in satisfactory condition with only complaints of worsening cough.  MD made aware.  Augmentin sent to patient's pharmacy.  Vital signs are stable.  We will proceed with treatment per MD orders.    Hgb today is 7.9.  ANC is 1.4 and platelets are 91.  MD made aware.  We will give 1 unit of PRBC today per standing orders by Dr. Cheree Cords.  Orders placed by charge RN.   Patient tolerated treatment and transfusion well with no complaints voiced.  Patient left ambulatory in stable condition.  Vital signs stable at discharge.  Follow up as scheduled.

## 2023-12-19 NOTE — Progress Notes (Signed)
 Nutrition Follow-up:   Pt with CMML. She is receiving Vidaza  q28d. Patient is under the care of Dr. Cheree Cords   Met with patient in infusion. She reports doing well. Patient endorses a good appetite. Feels she has been eating better. Patient is not taking appetite stimulant. Recalls bowl of cereal in the morning. Has half of sandwich for lunch. She ate cornbread, cream potatoes, salmon, and broccoli for dinner. Patient eating a lot of ice cream sandwiches lately. Patient is drinking 3 Ensure Plus most days. She denies nausea, vomiting, diarrhea, constipation.    Medications: reviewed   Labs: Hgb 7.9  Anthropometrics: Wt 127 lb 3.3 oz today increased   5/27 - 124 lb  5/5 - 129 lb 10.1 oz     NUTRITION DIAGNOSIS: Food and nutrition related knowledge deficit improving    INTERVENTION:  Continue drinking 3 Ensure Plus/equivalent - samples + coupons provided     MONITORING, EVALUATION, GOAL: wt trends, intake   NEXT VISIT: Monday Sherin 30 during infusion

## 2023-12-20 ENCOUNTER — Inpatient Hospital Stay

## 2023-12-20 VITALS — BP 101/59 | HR 69 | Temp 98.5°F | Resp 18

## 2023-12-20 DIAGNOSIS — Z79899 Other long term (current) drug therapy: Secondary | ICD-10-CM | POA: Diagnosis not present

## 2023-12-20 DIAGNOSIS — Z95828 Presence of other vascular implants and grafts: Secondary | ICD-10-CM

## 2023-12-20 DIAGNOSIS — C9310 Chronic myelomonocytic leukemia not having achieved remission: Secondary | ICD-10-CM

## 2023-12-20 DIAGNOSIS — D696 Thrombocytopenia, unspecified: Secondary | ICD-10-CM | POA: Diagnosis not present

## 2023-12-20 DIAGNOSIS — Z5111 Encounter for antineoplastic chemotherapy: Secondary | ICD-10-CM | POA: Diagnosis not present

## 2023-12-20 LAB — BPAM RBC
Blood Product Expiration Date: 202506062359
ISSUE DATE / TIME: 202506021042
Unit Type and Rh: 1700

## 2023-12-20 LAB — TYPE AND SCREEN
ABO/RH(D): B POS
Antibody Screen: NEGATIVE
Unit division: 0

## 2023-12-20 MED ORDER — HEPARIN SOD (PORK) LOCK FLUSH 100 UNIT/ML IV SOLN
500.0000 [IU] | Freq: Once | INTRAVENOUS | Status: AC
  Administered 2023-12-20: 500 [IU] via INTRAVENOUS

## 2023-12-20 MED ORDER — SODIUM CHLORIDE 0.9 % IV SOLN
75.0000 mg/m2 | Freq: Once | INTRAVENOUS | Status: AC
  Administered 2023-12-20: 126 mg via INTRAVENOUS
  Filled 2023-12-20: qty 12.6

## 2023-12-20 MED ORDER — SODIUM CHLORIDE 0.9% FLUSH
10.0000 mL | Freq: Once | INTRAVENOUS | Status: AC
  Administered 2023-12-20: 10 mL via INTRAVENOUS

## 2023-12-20 MED ORDER — PALONOSETRON HCL INJECTION 0.25 MG/5ML
0.2500 mg | Freq: Once | INTRAVENOUS | Status: DC
Start: 2023-12-20 — End: 2023-12-20

## 2023-12-20 MED ORDER — SODIUM CHLORIDE 0.9 % IV SOLN: Freq: Once | INTRAVENOUS | Status: AC

## 2023-12-20 NOTE — Patient Instructions (Signed)
 [facility] [place] [facility]  Discharge Instructions: Thank you for choosing [facility] to provide your oncology and hematology care.  If you have a lab appointment with the Cancer Center - please note that after April 8th, 2024, all labs will be drawn in the cancer center.  You do not have to check in or register with the main entrance as you have in the past but will complete your check-in in the cancer center.  Wear comfortable clothing and clothing appropriate for easy access to any Portacath or PICC line.   We strive to give you quality time with your provider. You may need to reschedule your appointment if you arrive late (15 or more minutes).  Arriving late affects you and other patients whose appointments are after yours.  Also, if you miss three or more appointments without notifying the office, you may be dismissed from the clinic at the provider's discretion.      For prescription refill requests, have your pharmacy contact our office and allow 72 hours for refills to be completed.    Today you received the following chemotherapy and/or immunotherapy agents D6 Vidaza   To help prevent nausea and vomiting after your treatment, we encourage you to take your nausea medication as directed.  Azacitidine Injection What is this medication? AZACITIDINE (ay za SITE i deen) treats blood and bone marrow cancers. It works by slowing down the growth of cancer cells. This medicine may be used for other purposes; ask your health care provider or pharmacist if you have questions. COMMON BRAND NAME(S): Vidaza What should I tell my care team before I take this medication? They need to know if you have any of these conditions: Kidney disease Liver disease Low blood cell levels, such as low white cells, platelets, or red blood cells Low levels of albumin in the blood Low levels of bicarbonate in the blood An unusual or allergic reaction to  azacitidine, mannitol, other medications, foods, dyes, or preservatives If you or your partner are pregnant or trying to get pregnant Breast-feeding How should I use this medication? This medication is injected into a vein or under the skin. It is given by your care team in a hospital or clinic setting. Talk to your care team about the use of this medication in children. While it may be prescribed for children as young as 1 month for selected conditions, precautions do apply. Overdosage: If you think you have taken too much of this medicine contact a poison control center or emergency room at once. NOTE: This medicine is only for you. Do not share this medicine with others. What if I miss a dose? Keep appointments for follow-up doses. It is important not to miss your dose. Call your care team if you are unable to keep an appointment. What may interact with this medication? Interactions are not expected. This list may not describe all possible interactions. Give your health care provider a list of all the medicines, herbs, non-prescription drugs, or dietary supplements you use. Also tell them if you smoke, drink alcohol, or use illegal drugs. Some items may interact with your medicine. What should I watch for while using this medication? Your condition will be monitored carefully while you are receiving this medication. This medication may make you feel generally unwell. This is not uncommon as chemotherapy can affect healthy cells as well as cancer cells. Report any side effects. Continue your course of treatment  even though you feel ill unless your care team tells you to stop. You may need blood work done while you are taking this medication. Other product types may be available that contain the medication azacitidine. The injection and oral products should not be used in place of one another. Talk to your care team if you have questions. This medication can cause serious side effects. To reduce  the risk, your care team may give you other medications to take before receiving this one. Be sure to follow the directions from your care team. This medication may increase your risk of getting an infection. Call your care team for advice if you get a fever, chills, sore throat, or other symptoms of a cold or flu. Do not treat yourself. Try to avoid being around people who are sick. Avoid taking medications that contain aspirin, acetaminophen, ibuprofen, naproxen, or ketoprofen unless instructed by your care team. These medications may hide a fever. Be careful brushing or flossing your teeth or using a toothpick because you may get an infection or bleed more easily. If you have any dental work done, tell your dentist you are receiving this medication. Talk to your care team if you or your partner may be pregnant. Serious birth defects can occur if you take this medication during pregnancy and for 6 months after the last dose. You will need a negative pregnancy test before starting this medication. Contraception is recommended while taking this medication and for 6 months after the last dose. Your care team can help you find the option that works for you. If your partner can get pregnant, use a condom during sex while taking this medication and for 3 months after the last dose. Do not breastfeed while taking this medication and for 1 week after the last dose. This medication may cause infertility. Talk to your care team if you are concerned about your fertility. What side effects may I notice from receiving this medication? Side effects that you should report to your care team as soon as possible: Allergic reactions--skin rash, itching, hives, swelling of the face, lips, tongue, or throat Infection--fever, chills, cough, sore throat, wounds that don't heal, pain or trouble when passing urine, general feeling of discomfort or being unwell Kidney injury--decrease in the amount of urine, swelling of the  ankles, hands, or feet Liver injury--right upper belly pain, loss of appetite, nausea, light-colored stool, dark yellow or brown urine, yellowing skin or eyes, unusual weakness or fatigue Low red blood cell level--unusual weakness or fatigue, dizziness, headache, trouble breathing Tumor lysis syndrome (TLS)--nausea, vomiting, diarrhea, decrease in the amount of urine, dark urine, unusual weakness or fatigue, confusion, muscle pain or cramps, fast or irregular heartbeat, joint pain Unusual bruising or bleeding Side effects that usually do not require medical attention (report to your care team if they continue or are bothersome): Constipation Diarrhea Nausea Pain, redness, or irritation at injection site Vomiting This list may not describe all possible side effects. Call your doctor for medical advice about side effects. You may report side effects to FDA at 1-800-FDA-1088. Where should I keep my medication? This medication is given in a hospital or clinic. It will not be stored at home. NOTE: This sheet is a summary. It may not cover all possible information. If you have questions about this medicine, talk to your doctor, pharmacist, or health care provider.  2024 Elsevier/Gold Standard (2023-03-07 00:00:00)  BELOW ARE SYMPTOMS THAT SHOULD BE REPORTED IMMEDIATELY: *FEVER GREATER THAN 100.4 F (38 C)  OR HIGHER *CHILLS OR SWEATING *NAUSEA AND VOMITING THAT IS NOT CONTROLLED WITH YOUR NAUSEA MEDICATION *UNUSUAL SHORTNESS OF BREATH *UNUSUAL BRUISING OR BLEEDING *URINARY PROBLEMS (pain or burning when urinating, or frequent urination) *BOWEL PROBLEMS (unusual diarrhea, constipation, pain near the anus) TENDERNESS IN MOUTH AND THROAT WITH OR WITHOUT PRESENCE OF ULCERS (sore throat, sores in mouth, or a toothache) UNUSUAL RASH, SWELLING OR PAIN  UNUSUAL VAGINAL DISCHARGE OR ITCHING   Items with * indicate a potential emergency and should be followed up as soon as possible or go to the  Emergency Department if any problems should occur.  Please show the CHEMOTHERAPY ALERT CARD or IMMUNOTHERAPY ALERT CARD at check-in to the Emergency Department and triage nurse.  Should you have questions after your visit or need to cancel or reschedule your appointment, please contact [facility] CANCER CTR [place] - A DEPT OF Eligha Bridegroom [facility] [phone]  and follow the prompts.  Office hours are 8:00 a.m. to 4:30 p.m. Monday - Friday. Please note that voicemails left after 4:00 p.m. may not be returned until the following business day.  We are closed weekends and major holidays. You have access to a nurse at all times for urgent questions. Please call the main number to the clinic [phone] and follow the prompts.  For any non-urgent questions, you may also contact your provider using MyChart. We now offer e-Visits for anyone 1 and older to request care online for non-urgent symptoms. For details visit [email or web address].   Also download the MyChart app! Go to the app store, search "MyChart", open the app, select [place], and log in with your MyChart username and password.

## 2023-12-20 NOTE — Progress Notes (Signed)
 Patient presents today for D6 Vidaza per provider's order. Vital signs stable and patient voiced no new complaints at this time.   Treatment given today per MD orders. Tolerated infusion without adverse affects. Vital signs stable. No complaints at this time. Discharged from clinic ambulatory in stable condition. Alert and oriented x 3. F/U with [facility] as scheduled.

## 2023-12-21 ENCOUNTER — Other Ambulatory Visit: Payer: Self-pay

## 2023-12-21 ENCOUNTER — Inpatient Hospital Stay

## 2023-12-21 VITALS — BP 114/65 | HR 75 | Temp 98.0°F | Resp 18

## 2023-12-21 DIAGNOSIS — C9310 Chronic myelomonocytic leukemia not having achieved remission: Secondary | ICD-10-CM | POA: Diagnosis not present

## 2023-12-21 DIAGNOSIS — D696 Thrombocytopenia, unspecified: Secondary | ICD-10-CM | POA: Diagnosis not present

## 2023-12-21 DIAGNOSIS — Z5111 Encounter for antineoplastic chemotherapy: Secondary | ICD-10-CM | POA: Diagnosis not present

## 2023-12-21 DIAGNOSIS — Z95828 Presence of other vascular implants and grafts: Secondary | ICD-10-CM

## 2023-12-21 DIAGNOSIS — Z79899 Other long term (current) drug therapy: Secondary | ICD-10-CM | POA: Diagnosis not present

## 2023-12-21 MED ORDER — HEPARIN SOD (PORK) LOCK FLUSH 100 UNIT/ML IV SOLN
500.0000 [IU] | Freq: Once | INTRAVENOUS | Status: AC
  Administered 2023-12-21: 500 [IU] via INTRAVENOUS

## 2023-12-21 MED ORDER — PALONOSETRON HCL INJECTION 0.25 MG/5ML
0.2500 mg | Freq: Once | INTRAVENOUS | Status: AC
  Administered 2023-12-21: 0.25 mg via INTRAVENOUS
  Filled 2023-12-21: qty 5

## 2023-12-21 MED ORDER — SODIUM CHLORIDE 0.9 % IV SOLN
75.0000 mg/m2 | Freq: Once | INTRAVENOUS | Status: AC
  Administered 2023-12-21: 126 mg via INTRAVENOUS
  Filled 2023-12-21: qty 12.6

## 2023-12-21 MED ORDER — SODIUM CHLORIDE 0.9 % IV SOLN: Freq: Once | INTRAVENOUS | Status: AC

## 2023-12-21 NOTE — Patient Instructions (Signed)
 [facility] [place] [facility]  Discharge Instructions: Thank you for choosing [facility] to provide your oncology and hematology care.  If you have a lab appointment with the Cancer Center - please note that after April 8th, 2024, all labs will be drawn in the cancer center.  You do not have to check in or register with the main entrance as you have in the past but will complete your check-in in the cancer center.  Wear comfortable clothing and clothing appropriate for easy access to any Portacath or PICC line.   We strive to give you quality time with your provider. You may need to reschedule your appointment if you arrive late (15 or more minutes).  Arriving late affects you and other patients whose appointments are after yours.  Also, if you miss three or more appointments without notifying the office, you may be dismissed from the clinic at the provider's discretion.      For prescription refill requests, have your pharmacy contact our office and allow 72 hours for refills to be completed.    Today you received the following chemotherapy and/or immunotherapy agents vidaza.       To help prevent nausea and vomiting after your treatment, we encourage you to take your nausea medication as directed.  BELOW ARE SYMPTOMS THAT SHOULD BE REPORTED IMMEDIATELY: *FEVER GREATER THAN 100.4 F (38 C) OR HIGHER *CHILLS OR SWEATING *NAUSEA AND VOMITING THAT IS NOT CONTROLLED WITH YOUR NAUSEA MEDICATION *UNUSUAL SHORTNESS OF BREATH *UNUSUAL BRUISING OR BLEEDING *URINARY PROBLEMS (pain or burning when urinating, or frequent urination) *BOWEL PROBLEMS (unusual diarrhea, constipation, pain near the anus) TENDERNESS IN MOUTH AND THROAT WITH OR WITHOUT PRESENCE OF ULCERS (sore throat, sores in mouth, or a toothache) UNUSUAL RASH, SWELLING OR PAIN  UNUSUAL VAGINAL DISCHARGE OR ITCHING   Items with * indicate a potential emergency and should be followed up  as soon as possible or go to the Emergency Department if any problems should occur.  Please show the CHEMOTHERAPY ALERT CARD or IMMUNOTHERAPY ALERT CARD at check-in to the Emergency Department and triage nurse.  Should you have questions after your visit or need to cancel or reschedule your appointment, please contact [facility] CANCER CTR [place] - A DEPT OF [facility] Bridegroom [facility] [phone]  and follow the prompts.  Office hours are 8:00 a.m. to 4:30 p.m. Monday - Friday. Please note that voicemails left after 4:00 p.m. may not be returned until the following business day.  We are closed weekends and major holidays. You have access to a nurse at all times for urgent questions. Please call the main number to the clinic [phone] and follow the prompts.  For any non-urgent questions, you may also contact your provider using MyChart. We now offer e-Visits for anyone 8 and older to request care online for non-urgent symptoms. For details visit [email or web address].   Also download the MyChart app! Go to the app store, search "MyChart", open the app, select [place], and log in with your MyChart username and password.

## 2023-12-21 NOTE — Progress Notes (Signed)
 Patient tolerated chemotherapy with no complaints voiced.  Side effects with management reviewed with understanding verbalized.  Port site clean and dry with no bruising or swelling noted at site.  Good blood return noted before and after administration of chemotherapy.  Band aid applied.  Patient left in satisfactory condition with VSS and no s/s of distress noted. All follow ups as scheduled.   [name] [employer]

## 2023-12-26 ENCOUNTER — Ambulatory Visit

## 2023-12-26 DIAGNOSIS — J4550 Severe persistent asthma, uncomplicated: Secondary | ICD-10-CM

## 2023-12-30 DIAGNOSIS — C9200 Acute myeloblastic leukemia, not having achieved remission: Secondary | ICD-10-CM | POA: Diagnosis not present

## 2023-12-30 DIAGNOSIS — C9310 Chronic myelomonocytic leukemia not having achieved remission: Secondary | ICD-10-CM | POA: Diagnosis not present

## 2024-01-09 ENCOUNTER — Inpatient Hospital Stay

## 2024-01-09 ENCOUNTER — Inpatient Hospital Stay: Admitting: Hematology

## 2024-01-09 DIAGNOSIS — Z7682 Awaiting organ transplant status: Secondary | ICD-10-CM | POA: Diagnosis not present

## 2024-01-09 DIAGNOSIS — C9200 Acute myeloblastic leukemia, not having achieved remission: Secondary | ICD-10-CM | POA: Diagnosis not present

## 2024-01-09 DIAGNOSIS — Z01818 Encounter for other preprocedural examination: Secondary | ICD-10-CM | POA: Diagnosis not present

## 2024-01-09 DIAGNOSIS — C9310 Chronic myelomonocytic leukemia not having achieved remission: Secondary | ICD-10-CM | POA: Diagnosis not present

## 2024-01-10 ENCOUNTER — Inpatient Hospital Stay (HOSPITAL_BASED_OUTPATIENT_CLINIC_OR_DEPARTMENT_OTHER): Admitting: Hematology

## 2024-01-10 ENCOUNTER — Encounter: Payer: Self-pay | Admitting: Hematology

## 2024-01-10 ENCOUNTER — Inpatient Hospital Stay

## 2024-01-10 VITALS — BP 108/60 | HR 68 | Temp 97.0°F | Resp 20

## 2024-01-10 VITALS — BP 107/69 | HR 81 | Temp 97.2°F | Resp 20 | Wt 124.6 lb

## 2024-01-10 DIAGNOSIS — C9310 Chronic myelomonocytic leukemia not having achieved remission: Secondary | ICD-10-CM

## 2024-01-10 DIAGNOSIS — D696 Thrombocytopenia, unspecified: Secondary | ICD-10-CM | POA: Diagnosis not present

## 2024-01-10 DIAGNOSIS — Z95828 Presence of other vascular implants and grafts: Secondary | ICD-10-CM

## 2024-01-10 DIAGNOSIS — Z79899 Other long term (current) drug therapy: Secondary | ICD-10-CM | POA: Diagnosis not present

## 2024-01-10 DIAGNOSIS — Z5111 Encounter for antineoplastic chemotherapy: Secondary | ICD-10-CM | POA: Diagnosis not present

## 2024-01-10 LAB — COMPREHENSIVE METABOLIC PANEL WITH GFR
ALT: 16 U/L (ref 0–44)
AST: 26 U/L (ref 15–41)
Albumin: 4.2 g/dL (ref 3.5–5.0)
Alkaline Phosphatase: 56 U/L (ref 38–126)
Anion gap: 10 (ref 5–15)
BUN: 16 mg/dL (ref 8–23)
CO2: 23 mmol/L (ref 22–32)
Calcium: 9.4 mg/dL (ref 8.9–10.3)
Chloride: 104 mmol/L (ref 98–111)
Creatinine, Ser: 1 mg/dL (ref 0.44–1.00)
GFR, Estimated: 60 mL/min — ABNORMAL LOW (ref >60)
Glucose, Bld: 104 mg/dL — ABNORMAL HIGH (ref 70–99)
Potassium: 4.3 mmol/L (ref 3.5–5.1)
Sodium: 137 mmol/L (ref 135–145)
Total Bilirubin: 1.7 mg/dL — ABNORMAL HIGH (ref 0.0–1.2)
Total Protein: 7.2 g/dL (ref 6.5–8.1)

## 2024-01-10 LAB — CBC WITH DIFFERENTIAL/PLATELET
Abs Immature Granulocytes: 0.2 10*3/uL — ABNORMAL HIGH (ref 0.00–0.07)
Band Neutrophils: 1 %
Basophils Absolute: 0 10*3/uL (ref 0.0–0.1)
Basophils Relative: 0 %
Eosinophils Absolute: 0 10*3/uL (ref 0.0–0.5)
Eosinophils Relative: 0 %
HCT: 30.4 % — ABNORMAL LOW (ref 36.0–46.0)
Hemoglobin: 9.3 g/dL — ABNORMAL LOW (ref 12.0–15.0)
Lymphocytes Relative: 23 %
Lymphs Abs: 0.7 10*3/uL (ref 0.7–4.0)
MCH: 30 pg (ref 26.0–34.0)
MCHC: 30.6 g/dL (ref 30.0–36.0)
MCV: 98.1 fL (ref 80.0–100.0)
Metamyelocytes Relative: 4 %
Monocytes Absolute: 0.1 10*3/uL (ref 0.1–1.0)
Monocytes Relative: 4 %
Myelocytes: 3 %
Neutro Abs: 2 10*3/uL (ref 1.7–7.7)
Neutrophils Relative %: 65 %
Platelets: 171 10*3/uL (ref 150–400)
RBC: 3.1 MIL/uL — ABNORMAL LOW (ref 3.87–5.11)
RDW: 19.9 % — ABNORMAL HIGH (ref 11.5–15.5)
WBC: 3.1 10*3/uL — ABNORMAL LOW (ref 4.0–10.5)
nRBC: 1 /100{WBCs} — ABNORMAL HIGH
nRBC: 2.3 % — ABNORMAL HIGH (ref 0.0–0.2)

## 2024-01-10 LAB — LACTATE DEHYDROGENASE: LDH: 542 U/L — ABNORMAL HIGH (ref 98–192)

## 2024-01-10 LAB — MAGNESIUM: Magnesium: 2.1 mg/dL (ref 1.7–2.4)

## 2024-01-10 MED ORDER — PALONOSETRON HCL INJECTION 0.25 MG/5ML
0.2500 mg | Freq: Once | INTRAVENOUS | Status: AC
  Administered 2024-01-10: 0.25 mg via INTRAVENOUS
  Filled 2024-01-10: qty 5

## 2024-01-10 MED ORDER — SODIUM CHLORIDE 0.9% FLUSH
10.0000 mL | Freq: Once | INTRAVENOUS | Status: AC
  Administered 2024-01-10: 10 mL via INTRAVENOUS

## 2024-01-10 MED ORDER — SODIUM CHLORIDE 0.9 % IV SOLN
75.0000 mg/m2 | Freq: Once | INTRAVENOUS | Status: AC
  Administered 2024-01-10: 126 mg via INTRAVENOUS
  Filled 2024-01-10: qty 12.6

## 2024-01-10 MED ORDER — SODIUM CHLORIDE 0.9% FLUSH
10.0000 mL | INTRAVENOUS | Status: DC | PRN
  Administered 2024-01-10: 10 mL via INTRAVENOUS

## 2024-01-10 MED ORDER — SODIUM CHLORIDE 0.9 % IV SOLN: Freq: Once | INTRAVENOUS | Status: AC

## 2024-01-10 MED ORDER — HEPARIN SOD (PORK) LOCK FLUSH 100 UNIT/ML IV SOLN
500.0000 [IU] | Freq: Once | INTRAVENOUS | Status: AC
  Administered 2024-01-10: 500 [IU] via INTRAVENOUS

## 2024-01-10 NOTE — Progress Notes (Signed)
 [name] [address] [name]    Clinic Day:  01/10/2024  Referring physician: Sheryle Carwin, MD  Patient Care Team: Sheryle Carwin, MD as PCP - General (Internal Medicine) [name], MD as Attending Physician (Gastroenterology) [name], MD as Consulting Physician (Pulmonary Disease) [name], MD as Medical Oncologist (Hematology)   ASSESSMENT & PLAN:   Assessment: 1.  Macrocytic anemia and thrombocytopenia: - Patient seen at the request of Dr. Sheryle for abnormal CBC. - 04/17/2022: WBC 15.8, Hb-9.7, PLT-128 - 03/24/2022: WBC-8.1 (N-43%, L-17%, M-19%, B-2%), Hb-9.6, MCV-99, PLT-115 - Smear review: 13% metamyelocytes, 2% myelocytes, 2% blasts,  - 10/19/2021: WBC-5.3 (N-61, L-21, M-10%), Hb-11, MCV-96, PLT-97 - BMBX (05/25/2022): Hypercellular bone marrow with myeloid hyperplasia with dysgranulopoiesis, erythroid hypoplasia and megakaryocytic hyperplasia with dyspoiesis.  Chromosome analysis was 6, XX.  Bone marrow blasts less than 5%.  Peripheral blood blasts less than 2%. - Mayo molecular model risk stratification: Intermediate 2 risk with at least 2 points (decreased hemoglobin less than 10, circulating immature cells).  Intermediate 2 risk with median OS 31 months. - Serum EPO level 41. - NGS: Positive for CBL, MPL, SRSF2, TET 2, RAD21 - PET scan (07/08/2022): Splenomegaly (volume 510 mm) with normal metabolic activity.  Uniform increase in marrow metabolic activity related to patient's anemia.  No lymphadenopathy. - BMBX (07/16/2022): Hypercellular bone marrow (95-100%) with myeloid hyperplasia, atypical monocytes, increased blasts (16% overall).  Atypical monocytosis present 23% of total events, expressing HLA-DR, CD38, CD4 dim, CD11C, CD13, CD14, CD36, CD64 with aberrant coexpression of CD56 and CD7. - Cycle 1 of azacitidine  on 08/02/2022, cycle 2 on 08/30/2022.  Azacitidine  decreased to 5 days starting cycle 5 on 11/22/2022 - BMBX  (09/21/2022): 3% blasts, hypercellular marrow with markedly increased dyspoietic megakaryocytes, increased fibrosis - BMBX (11/16/2022): Hypercellular marrow (70-80%) with marked GIST [name] (, myelofibrosis and 3% blasts on a very limited sample. - BMBX (05/06/2023): Hypercellular marrow with 85% cellularity with fibrosis and 4% blasts approximately. - BMBX (07/08/2023): Hypercellular marrow (95%) with increased myelopoiesis, dysmegakrypoiesis, increased fibrosis and 5% blasts.  Cytogenetics 39, XX. - NGS (07/08/2023): MPL, SRSF2, RUNX1, TET2 - BMBX (09/13/2023): Hypercellular marrow (100%) with increased myelopoiesis and this megakaryocytes.  Moderate myelofibrosis, grade 2 of 3.  No increase in blasts (1%).   2.  Social/family history: - She lives at home by herself.  Son lives next door. - Does part-time work at [employer] [name].  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  Non-smoker. - Maternal aunt had tumor in the breast, patient not certain if it is cancer.  2 maternal first cousins had lymphoma.  Mother had stomach cancer.    Plan: 1.  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  No infections reported. - She received 1 unit PRBC on 12/19/2023. - She met with Dr. [name] on 12/27/2023.  Bone marrow biopsy was deferred.  She also met with Dr. Doyal Barrio on 01/09/2024 for transplant evaluation. - We reviewed labs today: Normal LFTs.  LDH is 542.  CBC grossly stable with white count 3.1 and normal ANC.  Hemoglobin is 9.3.  No transfusion necessary. - She may proceed with cycle 18 today with azacitidine  75 mg/m x 7 days.  RTC 4 weeks for follow-up.  Will check CBC with blood bank sample in 2 weeks to see if she needs any transfusion.   2.  Folate deficiency: - Continue folic  acid tablet daily.   3.  Weight loss: - Continue boost/Ensure 1 to 2 cans/day.  Weight is stable.   4.  Constipation: - Continue MiraLAX as  needed.    Orders Placed This Encounter  Procedures   Comprehensive metabolic panel    Standing Status:   Future    Expected Date:   03/06/2024    Expiration Date:   03/06/2025   Magnesium    Standing Status:   Future    Expected Date:   03/06/2024    Expiration Date:   03/06/2025   Lactate dehydrogenase    Standing Status:   Future    Expected Date:   03/06/2024    Expiration Date:   03/06/2025   CMP ([name] only)    Standing Status:   Future    Expected Date:   03/06/2024    Expiration Date:   03/06/2025   CBC with Differential    Standing Status:   Future    Expected Date:   03/06/2024    Expiration Date:   03/06/2025   CBC    Standing Status:   Future    Expected Date:   01/24/2024    Expiration Date:   04/23/2024   Sample to Blood Bank(Blood Bank Hold)    Standing Status:   Future    Expected Date:   01/24/2024    Expiration Date:   04/23/2024      [name],acting as a scribe for [name], MD.,have documented all relevant documentation on the behalf of [name], MD,as directed by  [name], MD while in the presence of [name], MD.  I, [name] MD, have reviewed the above documentation for accuracy and completeness, and I agree with the above.     [name], MD   6/24/20254:18 PM  CHIEF COMPLAINT:   Diagnosis: CMML-2    Cancer Staging  No matching staging information was found for the patient.    Prior Therapy: none  Current Therapy:  Azacitidine  70 mg/m x 7 days every 28 days    HISTORY OF PRESENT ILLNESS:   Oncology History  CMML (chronic myelomonocytic leukemia) (HCC)  06/03/2022 Initial Diagnosis   CMML (chronic myelomonocytic leukemia) (HCC)   08/02/2022 -  Chemotherapy   Patient is on Treatment Plan : MYELODYSPLASIA  Azacitidine  IVPB D1-7 q28d        INTERVAL HISTORY:   [name] is a 73 y.o. female presenting to clinic today for follow up of CMML-2. She was last seen by me  on 12/13/23.  Today, she states that she is doing well overall. Her appetite level is at 75%. Her energy level is at 75%. She denies any infections in the last 4 weeks.   [name] had an appointment with Dr. [name] on 12/27/23 who did not recommend a biopsy. She has been given the approval for undergoing a transplant and has had her blood drawn to find a potential match. She is taking folic acid  as prescribed, drinking 1 Ensure a day, and MiraLAX prn. Her weight is stable.   PAST MEDICAL HISTORY:   Past Medical History: Past Medical History:  Diagnosis Date   Anxiety    Asthma    had 1 episode 1 year ago-no more problem   CHF (congestive heart failure) (HCC)    swelling of feet & ankles   CMML (chronic myelomonocytic leukemia) (HCC) 06/03/2022   Cough    Depression    OCD   GERD (gastroesophageal reflux disease)  severe   OCD (obsessive compulsive disorder)    Port-A-Cath in place 07/26/2022   Shortness of breath    with exertion   Yeast infection 04/09/2014    Surgical History: Past Surgical History:  Procedure Laterality Date   CARPAL TUNNEL RELEASE  03/22/2012   Procedure: CARPAL TUNNEL RELEASE;  Surgeon: Arley JONELLE Curia, MD;  Location: [name];  Service: Orthopedics;  Laterality: Right;  right carpal tunnel release   COLONOSCOPY  06/19/2012   Dr. Rourk:normal rectum and colon    ESOPHAGOGASTRODUODENOSCOPY N/A 12/26/2015   Dr. Shaaron: normal    IR IMAGING GUIDED PORT INSERTION  07/30/2022   TONSILLECTOMY     TUBAL LIGATION      Social History: Social History   Socioeconomic History   Marital status: Widowed    Spouse name: Not on file   Number of children: 1   Years of education: Not on file   Highest education level: Not on file  Occupational History   Occupation: retired  Tobacco Use   Smoking status: Never    Passive exposure: Yes   Smokeless tobacco: Never  Vaping Use   Vaping status: Never Used  Substance and Sexual Activity   Alcohol use: No    Drug use: No   Sexual activity: Yes    Birth control/protection: Surgical    Comment: tubal  Other Topics Concern   Not on file  Social History Narrative   Not on file   Social Drivers of Health   Financial Resource Strain: Not on file  Food Insecurity: Not on file  Transportation Needs: Not on file  Physical Activity: Not on file  Stress: Not on file  Social Connections: Not on file  Intimate Partner Violence: Not on file    Family History: Family History  Problem Relation Age of Onset   Asthma Mother    Macular degeneration Mother    Hypertension Mother    Stomach cancer Mother 30   Alzheimer's disease Father    Other Sister        blood clots   Diabetes Maternal Grandfather    Diabetes Paternal Grandfather    Other Son        enlarged spleen   Lymphoma Cousin        x2, both dx in their 52s   Colon cancer Neg Hx     Current Medications:  Current Outpatient Medications:    albuterol  (VENTOLIN  HFA) 108 (90 Base) MCG/ACT inhaler, INHALE TWO PUFFS EVERY 4 HOURS AS NEEDED FOR WHEEZING OR FOR SHORTNESS OF BREATH, Disp: 18 g, Rfl: 1   albuterol  (VENTOLIN  HFA) 108 (90 Base) MCG/ACT inhaler, INHALE 2 PUFFS BY MOUTH EVERY 4 HOURS AS NEEDED FOR SHORTNESS OF BREATH OR WHEEZING., Disp: 18 g, Rfl: 1   amoxicillin -clavulanate (AUGMENTIN ) 875-125 MG tablet, Take 1 tablet by mouth 2 (two) times daily., Disp: 14 tablet, Rfl: 0   azaCITIDine  5 mg/2 mLs in lactated ringers  infusion, Inject into the vein daily. Days 1-7 every 28 days, Disp: , Rfl:    azelastine  (ASTELIN ) 0.1 % nasal spray, SPRAY 1 TO 2 SPRAYS PER NOSTRIL TWICE DAILY., Disp: 30 mL, Rfl: 3   benzonatate  (TESSALON ) 200 MG capsule, Take 200 mg by mouth 3 (three) times daily as needed., Disp: , Rfl:    BREZTRI  AEROSPHERE 160-9-4.8 MCG/ACT AERO, Inhale 2 puffs into the lungs 2 (two) times daily., Disp: 10.7 g, Rfl: 5   cetirizine  (ZYRTEC ) 5 MG tablet, Take 1 tablet (5 mg total)  by mouth daily., Disp: 30 tablet, Rfl: 0    ciclopirox  (PENLAC ) 8 % solution, APPLY TOPICALLY AT BEDTIME. APPLY OVER NAIL FOLD AND SURROUNDING SKIN. APPLY DAILY OVER PREVIOUS COAT. AFTER SEVEN DAYS, MAY REMOVE WITH ALCOHOL AND CONTINUE CYCLE., Disp: 6.6 mL, Rfl: 2   EPINEPHrine  0.3 mg/0.3 mL IJ SOAJ injection, Inject 0.3 mg into the muscle as needed for anaphylaxis., Disp: 2 each, Rfl: 1   famotidine  (PEPCID ) 20 MG tablet, Take 1 tablet (20 mg total) by mouth 2 (two) times daily., Disp: 60 tablet, Rfl: 5   FLUoxetine (PROZAC) 20 MG capsule, Take 1 capsule by mouth daily., Disp: , Rfl:    fluticasone  (FLONASE ) 50 MCG/ACT nasal spray, Place 2 sprays into both nostrils 2 (two) times daily., Disp: 16 g, Rfl: 5   folic acid  (FOLVITE ) 1 MG tablet, TAKE ONE TABLET BY MOUTH ONCE DAILY, Disp: 30 tablet, Rfl: 5   gabapentin (NEURONTIN) 300 MG capsule, , Disp: , Rfl:    HYDROcodone  bit-homatropine (HYCODAN) 5-1.5 MG/5ML syrup, , Disp: , Rfl:    ipratropium (ATROVENT ) 0.06 % nasal spray, USE TWO SPRAYS IN EACH NOSTRIL THREE TIMES DAILY AS NEEDED, Disp: 15 mL, Rfl: 5   lactulose  (CHRONULAC ) 10 GM/15ML solution, Take 15 mLs (10 g total) by mouth every 3 (three) hours as needed for mild constipation., Disp: 450 mL, Rfl: 2   lidocaine -prilocaine  (EMLA ) cream, Apply a quarter sized amount to port a cath site and cover with plastic wrap one hour prior to infusion appointments, Disp: 30 g, Rfl: 3   megestrol  (MEGACE ) 400 MG/10ML suspension, Take 10 mLs (400 mg total) by mouth 2 (two) times daily., Disp: 480 mL, Rfl: 2   montelukast  (SINGULAIR ) 10 MG tablet, Take 1 tablet (10 mg total) by mouth at bedtime., Disp: 30 tablet, Rfl: 5   NON FORMULARY, [facility]  Antifungal (nail)-#1, Disp: , Rfl:    pantoprazole  (PROTONIX ) 40 MG tablet, TAKE ONE TABLET BY MOUTH ONCE DAILY, Disp: 30 tablet, Rfl: 11   prochlorperazine  (COMPAZINE ) 10 MG tablet, Take 1 tablet (10 mg total) by mouth every 6 (six) hours as needed for nausea or vomiting., Disp: 30 tablet, Rfl: 0    Respiratory Therapy Supplies (FLUTTER) DEVI, Use as directed, Disp: 1 each, Rfl: 3   SF 5000 PLUS 1.1 % CREA dental cream, , Disp: , Rfl:    Spacer/Aero-Holding Chambers (AEROCHAMBER PLUS WITH MASK) inhaler, 1 each by Other route See admin instructions. Use with inhaler as instructed., Disp: 1 each, Rfl: 1   Tezepelumab -ekko (TEZSPIRE ) 210 MG/1. SOAJ, Inject 210 mg into the skin every 28 (twenty-eight) days., Disp: 1.91 mL, Rfl: 11   tiZANidine  (ZANAFLEX ) 2 MG tablet, Take 1 tablet (2 mg total) by mouth 2 (two) times daily as needed for muscle spasms., Disp: 15 tablet, Rfl: 0   traZODone  (DESYREL ) 50 MG tablet, TAKE ONE TABLET BY MOUTH AT BEDTIME, Disp: 30 tablet, Rfl: 3  Current Facility-Administered Medications:    tezepelumab -ekko (TEZSPIRE ) 210 MG/1. syringe 210 mg, 210 mg, Subcutaneous, Q28 days, Jeneal Danita Macintosh, MD, 210 mg at 12/26/23 1028   Allergies: Allergies  Allergen Reactions   Levonorgestrel-Ethinyl Estrad Cough   Sulfa Antibiotics Other (See Comments)    Unknown- pt unsure of reaction; believes it may be nausea   Sulfamethoxazole-Trimethoprim Other (See Comments)   Cephalosporins Other (See Comments)    Other Reaction: Toxicity    REVIEW OF SYSTEMS:   Review of Systems  Constitutional:  Negative for chills, fatigue and fever.       +  generalized soreness throughout the body  HENT:   Negative for lump/mass, mouth sores, nosebleeds, sore throat and trouble swallowing.   Eyes:  Negative for eye problems.  Respiratory:  Positive for cough and shortness of breath.   Cardiovascular:  Negative for chest pain, leg swelling and palpitations.  Gastrointestinal:  Negative for abdominal pain, constipation, diarrhea, nausea and vomiting.  Genitourinary:  Negative for bladder incontinence, difficulty urinating, dysuria, frequency, hematuria and nocturia.   Musculoskeletal:  Negative for arthralgias, back pain, flank pain, myalgias and neck pain.  Skin:  Negative for  itching and rash.  Neurological:  Negative for dizziness, headaches and numbness.  Hematological:  Does not bruise/bleed easily.  Psychiatric/Behavioral:  Negative for depression, sleep disturbance and suicidal ideas. The patient is not nervous/anxious.   All other systems reviewed and are negative.    VITALS:   There were no vitals taken for this visit.  Wt Readings from Last 3 Encounters:  01/10/24 124 lb 9 oz (56.5 kg)  12/19/23 127 lb 3.3 oz (57.7 kg)  12/13/23 124 lb (56.2 kg)    There is no height or weight on file to calculate BMI.  Performance status (ECOG): 1 - Symptomatic but completely ambulatory  PHYSICAL EXAM:   Physical Exam Vitals and nursing note reviewed. Exam conducted with a chaperone present.  Constitutional:      Appearance: Normal appearance.   Cardiovascular:     Rate and Rhythm: Normal rate and regular rhythm.     Pulses: Normal pulses.     Heart sounds: Normal heart sounds.  Pulmonary:     Effort: Pulmonary effort is normal.     Breath sounds: Normal breath sounds.  Abdominal:     Palpations: Abdomen is soft. There is no hepatomegaly, splenomegaly or mass.     Tenderness: There is no abdominal tenderness.   Musculoskeletal:     Right lower leg: No edema.     Left lower leg: No edema.  Lymphadenopathy:     Cervical: No cervical adenopathy.     Right cervical: No superficial, deep or posterior cervical adenopathy.    Left cervical: No superficial, deep or posterior cervical adenopathy.     Upper Body:     Right upper body: No supraclavicular or axillary adenopathy.     Left upper body: No supraclavicular or axillary adenopathy.   Neurological:     General: No focal deficit present.     Mental Status: She is alert and oriented to person, place, and time.   Psychiatric:        Mood and Affect: Mood normal.        Behavior: Behavior normal.     LABS:   CBC     Component Value Date/Time   WBC 3.1 (L) 01/10/2024 0953   RBC 3.10 (L)  01/10/2024 0953   HGB 9.3 (L) 01/10/2024 0953   HGB 13.1 07/23/2020 1112   HCT 30.4 (L) 01/10/2024 0953   HCT 40.7 07/23/2020 1112   PLT 171 01/10/2024 0953   MCV 98.1 01/10/2024 0953   MCV 95 07/23/2020 1112   MCH 30.0 01/10/2024 0953   MCHC 30.6 01/10/2024 0953   RDW 19.9 (H) 01/10/2024 0953   RDW 14.2 07/23/2020 1112   LYMPHSABS 0.7 01/10/2024 0953   LYMPHSABS 0.7 07/23/2020 1112   MONOABS 0.1 01/10/2024 0953   EOSABS 0.0 01/10/2024 0953   EOSABS 0.1 07/23/2020 1112   BASOSABS 0.0 01/10/2024 0953   BASOSABS 0.0 07/23/2020 1112    CMP  Component Value Date/Time   NA 137 01/10/2024 0953   K 4.3 01/10/2024 0953   CL 104 01/10/2024 0953   CO2 23 01/10/2024 0953   GLUCOSE 104 (H) 01/10/2024 0953   BUN 16 01/10/2024 0953   CREATININE 1.00 01/10/2024 0953   CALCIUM 9.4 01/10/2024 0953   PROT 7.2 01/10/2024 0953   ALBUMIN 4.2 01/10/2024 0953   AST 26 01/10/2024 0953   ALT 16 01/10/2024 0953   ALKPHOS 56 01/10/2024 0953   BILITOT 1.7 (H) 01/10/2024 0953   GFRNONAA 60 (L) 01/10/2024 0953   GFRAA >60 02/24/2019 1415     No results found for: CEA1, CEA / No results found for: CEA1, CEA No results found for: PSA1 No results found for: CAN199 No results found for: RJW874  Lab Results  Component Value Date   TOTALPROTELP 6.3 04/26/2022   ALBUMINELP 3.8 04/26/2022   A1GS 0.3 04/26/2022   A2GS 0.5 04/26/2022   BETS 0.8 04/26/2022   GAMS 0.9 04/26/2022   MSPIKE Not Observed 04/26/2022   SPEI Comment 04/26/2022   Lab Results  Component Value Date   TIBC 343 04/26/2022   FERRITIN 114 04/26/2022   IRONPCTSAT 25 04/26/2022   Lab Results  Component Value Date   LDH 542 (H) 01/10/2024   LDH 606 (H) 12/13/2023   LDH 663 (H) 11/14/2023     STUDIES:   No results found.

## 2024-01-10 NOTE — Patient Instructions (Signed)
 [facility] [place] [facility]  Discharge Instructions: Thank you for choosing [facility] to provide your oncology and hematology care.  If you have a lab appointment with the Cancer Center - please note that after April 8th, 2024, all labs will be drawn in the cancer center.  You do not have to check in or register with the main entrance as you have in the past but will complete your check-in in the cancer center.  Wear comfortable clothing and clothing appropriate for easy access to any Portacath or PICC line.   We strive to give you quality time with your provider. You may need to reschedule your appointment if you arrive late (15 or more minutes).  Arriving late affects you and other patients whose appointments are after yours.  Also, if you miss three or more appointments without notifying the office, you may be dismissed from the clinic at the provider's discretion.      For prescription refill requests, have your pharmacy contact our office and allow 72 hours for refills to be completed.    Today you received the following chemotherapy and/or immunotherapy agents Vidaza      To help prevent nausea and vomiting after your treatment, we encourage you to take your nausea medication as directed.  BELOW ARE SYMPTOMS THAT SHOULD BE REPORTED IMMEDIATELY: *FEVER GREATER THAN 100.4 F (38 C) OR HIGHER *CHILLS OR SWEATING *NAUSEA AND VOMITING THAT IS NOT CONTROLLED WITH YOUR NAUSEA MEDICATION *UNUSUAL SHORTNESS OF BREATH *UNUSUAL BRUISING OR BLEEDING *URINARY PROBLEMS (pain or burning when urinating, or frequent urination) *BOWEL PROBLEMS (unusual diarrhea, constipation, pain near the anus) TENDERNESS IN MOUTH AND THROAT WITH OR WITHOUT PRESENCE OF ULCERS (sore throat, sores in mouth, or a toothache) UNUSUAL RASH, SWELLING OR PAIN  UNUSUAL VAGINAL DISCHARGE OR ITCHING   Items with * indicate a potential emergency and should be followed up as  soon as possible or go to the Emergency Department if any problems should occur.  Please show the CHEMOTHERAPY ALERT CARD or IMMUNOTHERAPY ALERT CARD at check-in to the Emergency Department and triage nurse.  Should you have questions after your visit or need to cancel or reschedule your appointment, please contact [facility] [place] - A DEPT OF Eligha Bridegroom [facility] [phone]  and follow the prompts.  Office hours are 8:00 a.m. to 4:30 p.m. Monday - Friday. Please note that voicemails left after 4:00 p.m. may not be returned until the following business day.  We are closed weekends and major holidays. You have access to a nurse at all times for urgent questions. Please call the main number to the clinic [phone] and follow the prompts.  For any non-urgent questions, you may also contact your provider using MyChart. We now offer e-Visits for anyone 72 and older to request care online for non-urgent symptoms. For details visit [email or web address].   Also download the MyChart app! Go to the app store, search "MyChart", open the app, select [place], and log in with your MyChart username and password.

## 2024-01-10 NOTE — Patient Instructions (Signed)
 [facility]  Discharge Instructions  You were seen and examined today by Dr. Rogers.  Dr. Rogers discussed your most recent lab work. You will get your treatment today.   Follow-up as scheduled.    Thank you for choosing [facility] to provide your oncology and hematology care.   To afford each patient quality time with our provider, please arrive at least 15 minutes before your scheduled appointment time. You may need to reschedule your appointment if you arrive late (10 or more minutes). Arriving late affects you and other patients whose appointments are after yours.  Also, if you miss three or more appointments without notifying the office, you may be dismissed from the clinic at the provider's discretion.    Again, thank you for choosing [facility].  Our hope is that these requests will decrease the amount of time that you wait before being seen by our physicians.   If you have a lab appointment with the Cancer Center - please note that after April 8th, all labs will be drawn in the cancer center.  You do not have to check in or register with the main entrance as you have in the past but will complete your check-in at the cancer center.            _____________________________________________________________  Should you have questions after your visit to [facility], please contact our office at [phone] and follow the prompts.  Our office hours are 8:00 a.m. to 4:30 p.m. Monday - Thursday and 8:00 a.m. to 2:30 p.m. Friday.  Please note that voicemails left after 4:00 p.m. may not be returned until the following business day.  We are closed weekends and all major holidays.  You do have access to a nurse 24-7, just call the main number to the clinic [phone] and do not press any options, hold on the line and a nurse will answer the phone.    For prescription refill requests, have your  pharmacy contact our office and allow 72 hours.    Masks are no longer required in the cancer centers. If you would like for your care team to wear a mask while they are taking care of you, please let them know. You may have one support person who is at least 73 years old accompany you for your appointments.

## 2024-01-10 NOTE — Progress Notes (Signed)
 Patient presents today for Vidaza  per providers order.  Vital signs and labs reviewed by MD.  Message received from Farrel Bohr LPN/Dr. Rogers patient okay for treatment.  Stable during infusion without adverse affects.  Vital signs stable.  No complaints at this time.  Discharge from clinic ambulatory in stable condition.  Alert and oriented X 3.  Follow up with [facility] as scheduled.

## 2024-01-10 NOTE — Progress Notes (Signed)
Patient has been assessed, vital signs and labs have been reviewed by Dr. Katragadda. ANC, Creatinine, LFTs, and Platelets are within treatment parameters per Dr. Katragadda. The patient is good to proceed with treatment at this time. Primary RN and pharmacy aware.  

## 2024-01-10 NOTE — Progress Notes (Signed)
 Patients port flushed without difficulty.  Good blood return noted with no bruising or swelling noted at site.  Patient remains accessed for treatment.

## 2024-01-11 ENCOUNTER — Inpatient Hospital Stay

## 2024-01-11 VITALS — BP 109/45 | HR 70 | Temp 98.1°F | Resp 18

## 2024-01-11 DIAGNOSIS — C9310 Chronic myelomonocytic leukemia not having achieved remission: Secondary | ICD-10-CM

## 2024-01-11 DIAGNOSIS — Z95828 Presence of other vascular implants and grafts: Secondary | ICD-10-CM

## 2024-01-11 DIAGNOSIS — D696 Thrombocytopenia, unspecified: Secondary | ICD-10-CM | POA: Diagnosis not present

## 2024-01-11 DIAGNOSIS — Z5111 Encounter for antineoplastic chemotherapy: Secondary | ICD-10-CM | POA: Diagnosis not present

## 2024-01-11 DIAGNOSIS — Z79899 Other long term (current) drug therapy: Secondary | ICD-10-CM | POA: Diagnosis not present

## 2024-01-11 MED ORDER — SODIUM CHLORIDE 0.9% FLUSH
10.0000 mL | Freq: Once | INTRAVENOUS | Status: AC
  Administered 2024-01-11: 10 mL via INTRAVENOUS

## 2024-01-11 MED ORDER — HEPARIN SOD (PORK) LOCK FLUSH 100 UNIT/ML IV SOLN
500.0000 [IU] | Freq: Once | INTRAVENOUS | Status: AC
  Administered 2024-01-11: 500 [IU] via INTRAVENOUS

## 2024-01-11 MED ORDER — SODIUM CHLORIDE 0.9 % IV SOLN: Freq: Once | INTRAVENOUS | Status: AC

## 2024-01-11 MED ORDER — SODIUM CHLORIDE 0.9 % IV SOLN
75.0000 mg/m2 | Freq: Once | INTRAVENOUS | Status: AC
  Administered 2024-01-11: 126 mg via INTRAVENOUS
  Filled 2024-01-11: qty 12.6

## 2024-01-11 NOTE — Patient Instructions (Signed)
 [facility] [place] [facility]  Discharge Instructions: Thank you for choosing [facility] to provide your oncology and hematology care.  If you have a lab appointment with the Cancer Center - please note that after April 8th, 2024, all labs will be drawn in the cancer center.  You do not have to check in or register with the main entrance as you have in the past but will complete your check-in in the cancer center.  Wear comfortable clothing and clothing appropriate for easy access to any Portacath or PICC line.   We strive to give you quality time with your provider. You may need to reschedule your appointment if you arrive late (15 or more minutes).  Arriving late affects you and other patients whose appointments are after yours.  Also, if you miss three or more appointments without notifying the office, you may be dismissed from the clinic at the provider's discretion.      For prescription refill requests, have your pharmacy contact our office and allow 72 hours for refills to be completed.    Today you received the following chemotherapy and/or immunotherapy agents Vidaza      To help prevent nausea and vomiting after your treatment, we encourage you to take your nausea medication as directed.  BELOW ARE SYMPTOMS THAT SHOULD BE REPORTED IMMEDIATELY: *FEVER GREATER THAN 100.4 F (38 C) OR HIGHER *CHILLS OR SWEATING *NAUSEA AND VOMITING THAT IS NOT CONTROLLED WITH YOUR NAUSEA MEDICATION *UNUSUAL SHORTNESS OF BREATH *UNUSUAL BRUISING OR BLEEDING *URINARY PROBLEMS (pain or burning when urinating, or frequent urination) *BOWEL PROBLEMS (unusual diarrhea, constipation, pain near the anus) TENDERNESS IN MOUTH AND THROAT WITH OR WITHOUT PRESENCE OF ULCERS (sore throat, sores in mouth, or a toothache) UNUSUAL RASH, SWELLING OR PAIN  UNUSUAL VAGINAL DISCHARGE OR ITCHING   Items with * indicate a potential emergency and should be followed up as  soon as possible or go to the Emergency Department if any problems should occur.  Please show the CHEMOTHERAPY ALERT CARD or IMMUNOTHERAPY ALERT CARD at check-in to the Emergency Department and triage nurse.  Should you have questions after your visit or need to cancel or reschedule your appointment, please contact [facility] [place] - A DEPT OF Eligha Bridegroom [facility] [phone]  and follow the prompts.  Office hours are 8:00 a.m. to 4:30 p.m. Monday - Friday. Please note that voicemails left after 4:00 p.m. may not be returned until the following business day.  We are closed weekends and major holidays. You have access to a nurse at all times for urgent questions. Please call the main number to the clinic [phone] and follow the prompts.  For any non-urgent questions, you may also contact your provider using MyChart. We now offer e-Visits for anyone 72 and older to request care online for non-urgent symptoms. For details visit [email or web address].   Also download the MyChart app! Go to the app store, search "MyChart", open the app, select [place], and log in with your MyChart username and password.

## 2024-01-11 NOTE — Progress Notes (Signed)
 Patient presents today fro Day 2 Vidaza  infusion per providers order.  Vital signs and labs within parameters fro treatment.    Treatment given today per MD orders.  Stable during infusion without adverse affects.  Vital signs stable.  No complaints at this time.  Discharge from clinic ambulatory in stable condition.  Alert and oriented X 3.  Follow up with [facility] as scheduled.

## 2024-01-12 ENCOUNTER — Inpatient Hospital Stay

## 2024-01-12 VITALS — BP 103/64 | HR 77 | Temp 97.3°F | Resp 18

## 2024-01-12 DIAGNOSIS — C9310 Chronic myelomonocytic leukemia not having achieved remission: Secondary | ICD-10-CM

## 2024-01-12 DIAGNOSIS — Z79899 Other long term (current) drug therapy: Secondary | ICD-10-CM | POA: Diagnosis not present

## 2024-01-12 DIAGNOSIS — Z5111 Encounter for antineoplastic chemotherapy: Secondary | ICD-10-CM | POA: Diagnosis not present

## 2024-01-12 DIAGNOSIS — D696 Thrombocytopenia, unspecified: Secondary | ICD-10-CM | POA: Diagnosis not present

## 2024-01-12 DIAGNOSIS — Z95828 Presence of other vascular implants and grafts: Secondary | ICD-10-CM

## 2024-01-12 MED ORDER — HEPARIN SOD (PORK) LOCK FLUSH 100 UNIT/ML IV SOLN
500.0000 [IU] | Freq: Once | INTRAVENOUS | Status: AC
  Administered 2024-01-12: 500 [IU] via INTRAVENOUS

## 2024-01-12 MED ORDER — SODIUM CHLORIDE 0.9% FLUSH
10.0000 mL | Freq: Once | INTRAVENOUS | Status: AC
  Administered 2024-01-12: 10 mL via INTRAVENOUS

## 2024-01-12 MED ORDER — SODIUM CHLORIDE 0.9 % IV SOLN
75.0000 mg/m2 | Freq: Once | INTRAVENOUS | Status: AC
  Administered 2024-01-12: 126 mg via INTRAVENOUS
  Filled 2024-01-12: qty 12.6

## 2024-01-12 MED ORDER — SODIUM CHLORIDE 0.9 % IV SOLN: Freq: Once | INTRAVENOUS | Status: AC

## 2024-01-12 MED ORDER — PALONOSETRON HCL INJECTION 0.25 MG/5ML
0.2500 mg | Freq: Once | INTRAVENOUS | Status: AC
  Administered 2024-01-12: 0.25 mg via INTRAVENOUS
  Filled 2024-01-12: qty 5

## 2024-01-12 NOTE — Patient Instructions (Signed)
 [facility] [place] [facility]  Discharge Instructions: Thank you for choosing [facility] to provide your oncology and hematology care.  If you have a lab appointment with the Cancer Center - please note that after April 8th, 2024, all labs will be drawn in the cancer center.  You do not have to check in or register with the main entrance as you have in the past but will complete your check-in in the cancer center.  Wear comfortable clothing and clothing appropriate for easy access to any Portacath or PICC line.   We strive to give you quality time with your provider. You may need to reschedule your appointment if you arrive late (15 or more minutes).  Arriving late affects you and other patients whose appointments are after yours.  Also, if you miss three or more appointments without notifying the office, you may be dismissed from the clinic at the provider's discretion.      For prescription refill requests, have your pharmacy contact our office and allow 72 hours for refills to be completed.    Today you received the following chemotherapy and/or immunotherapy agents vidaza.       To help prevent nausea and vomiting after your treatment, we encourage you to take your nausea medication as directed.  BELOW ARE SYMPTOMS THAT SHOULD BE REPORTED IMMEDIATELY: *FEVER GREATER THAN 100.4 F (38 C) OR HIGHER *CHILLS OR SWEATING *NAUSEA AND VOMITING THAT IS NOT CONTROLLED WITH YOUR NAUSEA MEDICATION *UNUSUAL SHORTNESS OF BREATH *UNUSUAL BRUISING OR BLEEDING *URINARY PROBLEMS (pain or burning when urinating, or frequent urination) *BOWEL PROBLEMS (unusual diarrhea, constipation, pain near the anus) TENDERNESS IN MOUTH AND THROAT WITH OR WITHOUT PRESENCE OF ULCERS (sore throat, sores in mouth, or a toothache) UNUSUAL RASH, SWELLING OR PAIN  UNUSUAL VAGINAL DISCHARGE OR ITCHING   Items with * indicate a potential emergency and should be followed up  as soon as possible or go to the Emergency Department if any problems should occur.  Please show the CHEMOTHERAPY ALERT CARD or IMMUNOTHERAPY ALERT CARD at check-in to the Emergency Department and triage nurse.  Should you have questions after your visit or need to cancel or reschedule your appointment, please contact [facility] CANCER CTR [place] - A DEPT OF [facility] Bridegroom [facility] [phone]  and follow the prompts.  Office hours are 8:00 a.m. to 4:30 p.m. Monday - Friday. Please note that voicemails left after 4:00 p.m. may not be returned until the following business day.  We are closed weekends and major holidays. You have access to a nurse at all times for urgent questions. Please call the main number to the clinic [phone] and follow the prompts.  For any non-urgent questions, you may also contact your provider using MyChart. We now offer e-Visits for anyone 8 and older to request care online for non-urgent symptoms. For details visit [email or web address].   Also download the MyChart app! Go to the app store, search "MyChart", open the app, select [place], and log in with your MyChart username and password.

## 2024-01-12 NOTE — Progress Notes (Signed)
Patient tolerated chemotherapy with no complaints voiced.  Side effects with management reviewed with understanding verbalized.  Port site clean and dry with no bruising or swelling noted at site.  Good blood return noted before and after administration of chemotherapy.  Dressing intact.   Patient left in satisfactory condition with VSS and no s/s of distress noted.  

## 2024-01-13 ENCOUNTER — Inpatient Hospital Stay

## 2024-01-13 VITALS — BP 105/62 | HR 72 | Temp 97.7°F | Resp 18

## 2024-01-13 DIAGNOSIS — Z5111 Encounter for antineoplastic chemotherapy: Secondary | ICD-10-CM | POA: Diagnosis not present

## 2024-01-13 DIAGNOSIS — Z79899 Other long term (current) drug therapy: Secondary | ICD-10-CM | POA: Diagnosis not present

## 2024-01-13 DIAGNOSIS — D696 Thrombocytopenia, unspecified: Secondary | ICD-10-CM | POA: Diagnosis not present

## 2024-01-13 DIAGNOSIS — C9310 Chronic myelomonocytic leukemia not having achieved remission: Secondary | ICD-10-CM

## 2024-01-13 DIAGNOSIS — Z95828 Presence of other vascular implants and grafts: Secondary | ICD-10-CM

## 2024-01-13 MED ORDER — SODIUM CHLORIDE 0.9% FLUSH
10.0000 mL | Freq: Once | INTRAVENOUS | Status: AC
  Administered 2024-01-13: 10 mL via INTRAVENOUS

## 2024-01-13 MED ORDER — SODIUM CHLORIDE 0.9 % IV SOLN
75.0000 mg/m2 | Freq: Once | INTRAVENOUS | Status: AC
  Administered 2024-01-13: 126 mg via INTRAVENOUS
  Filled 2024-01-13: qty 12.6

## 2024-01-13 MED ORDER — HEPARIN SOD (PORK) LOCK FLUSH 100 UNIT/ML IV SOLN
500.0000 [IU] | Freq: Once | INTRAVENOUS | Status: AC
  Administered 2024-01-13: 500 [IU] via INTRAVENOUS

## 2024-01-13 MED ORDER — SODIUM CHLORIDE 0.9 % IV SOLN: Freq: Once | INTRAVENOUS | Status: AC

## 2024-01-13 NOTE — Patient Instructions (Signed)
 [facility] [place] - [facility]  Discharge Instructions: Thank you for choosing [facility] to provide your oncology and hematology care.  If you have a lab appointment with the Cancer Center - please note that after April 8th, 2024, all labs will be drawn in the cancer center.  You do not have to check in or register with the main entrance as you have in the past but will complete your check-in in the cancer center.  Wear comfortable clothing and clothing appropriate for easy access to any Portacath or PICC line.   We strive to give you quality time with your provider. You may need to reschedule your appointment if you arrive late (15 or more minutes).  Arriving late affects you and other patients whose appointments are after yours.  Also, if you miss three or more appointments without notifying the office, you may be dismissed from the clinic at the provider's discretion.      For prescription refill requests, have your pharmacy contact our office and allow 72 hours for refills to be completed.    Today you received the following chemotherapy and/or immunotherapy agents Vidaza.  Azacitidine Injection What is this medication? AZACITIDINE (ay za SITE i deen) treats blood and bone marrow cancers. It works by slowing down the growth of cancer cells. This medicine may be used for other purposes; ask your health care provider or pharmacist if you have questions. COMMON BRAND NAME(S): Vidaza What should I tell my care team before I take this medication? They need to know if you have any of these conditions: Kidney disease Liver disease Low blood cell levels, such as low white cells, platelets, or red blood cells Low levels of albumin in the blood Low levels of bicarbonate in the blood An unusual or allergic reaction to azacitidine, mannitol, other medications, foods, dyes, or preservatives If you or your partner are pregnant or trying to get  pregnant Breast-feeding How should I use this medication? This medication is injected into a vein or under the skin. It is given by your care team in a hospital or clinic setting. Talk to your care team about the use of this medication in children. While it may be prescribed for children as young as 1 month for selected conditions, precautions do apply. Overdosage: If you think you have taken too much of this medicine contact a poison control center or emergency room at once. NOTE: This medicine is only for you. Do not share this medicine with others. What if I miss a dose? Keep appointments for follow-up doses. It is important not to miss your dose. Call your care team if you are unable to keep an appointment. What may interact with this medication? Interactions are not expected. This list may not describe all possible interactions. Give your health care provider a list of all the medicines, herbs, non-prescription drugs, or dietary supplements you use. Also tell them if you smoke, drink alcohol, or use illegal drugs. Some items may interact with your medicine. What should I watch for while using this medication? Your condition will be monitored carefully while you are receiving this medication. This medication may make you feel generally unwell. This is not uncommon as chemotherapy can affect healthy cells as well as cancer cells. Report any side effects. Continue your course of treatment even though you feel ill unless your care team tells you to stop. You may need blood work done while you are  taking this medication. Other product types may be available that contain the medication azacitidine. The injection and oral products should not be used in place of one another. Talk to your care team if you have questions. This medication can cause serious side effects. To reduce the risk, your care team may give you other medications to take before receiving this one. Be sure to follow the directions from  your care team. This medication may increase your risk of getting an infection. Call your care team for advice if you get a fever, chills, sore throat, or other symptoms of a cold or flu. Do not treat yourself. Try to avoid being around people who are sick. Avoid taking medications that contain aspirin, acetaminophen, ibuprofen, naproxen, or ketoprofen unless instructed by your care team. These medications may hide a fever. Be careful brushing or flossing your teeth or using a toothpick because you may get an infection or bleed more easily. If you have any dental work done, tell your dentist you are receiving this medication. Talk to your care team if you or your partner may be pregnant. Serious birth defects can occur if you take this medication during pregnancy and for 6 months after the last dose. You will need a negative pregnancy test before starting this medication. Contraception is recommended while taking this medication and for 6 months after the last dose. Your care team can help you find the option that works for you. If your partner can get pregnant, use a condom during sex while taking this medication and for 3 months after the last dose. Do not breastfeed while taking this medication and for 1 week after the last dose. This medication may cause infertility. Talk to your care team if you are concerned about your fertility. What side effects may I notice from receiving this medication? Side effects that you should report to your care team as soon as possible: Allergic reactions--skin rash, itching, hives, swelling of the face, lips, tongue, or throat Infection--fever, chills, cough, sore throat, wounds that don't heal, pain or trouble when passing urine, general feeling of discomfort or being unwell Kidney injury--decrease in the amount of urine, swelling of the ankles, hands, or feet Liver injury--right upper belly pain, loss of appetite, nausea, light-colored stool, dark yellow or Iyla Balzarini  urine, yellowing skin or eyes, unusual weakness or fatigue Low red blood cell level--unusual weakness or fatigue, dizziness, headache, trouble breathing Tumor lysis syndrome (TLS)--nausea, vomiting, diarrhea, decrease in the amount of urine, dark urine, unusual weakness or fatigue, confusion, muscle pain or cramps, fast or irregular heartbeat, joint pain Unusual bruising or bleeding Side effects that usually do not require medical attention (report to your care team if they continue or are bothersome): Constipation Diarrhea Nausea Pain, redness, or irritation at injection site Vomiting This list may not describe all possible side effects. Call your doctor for medical advice about side effects. You may report side effects to FDA at 1-800-FDA-1088. Where should I keep my medication? This medication is given in a hospital or clinic. It will not be stored at home. NOTE: This sheet is a summary. It may not cover all possible information. If you have questions about this medicine, talk to your doctor, pharmacist, or health care provider.  2024 Elsevier/Gold Standard (2023-03-07 00:00:00)       To help prevent nausea and vomiting after your treatment, we encourage you to take your nausea medication as directed.  BELOW ARE SYMPTOMS THAT SHOULD BE REPORTED IMMEDIATELY: *FEVER GREATER THAN 100.4  F (38 C) OR HIGHER *CHILLS OR SWEATING *NAUSEA AND VOMITING THAT IS NOT CONTROLLED WITH YOUR NAUSEA MEDICATION *UNUSUAL SHORTNESS OF BREATH *UNUSUAL BRUISING OR BLEEDING *URINARY PROBLEMS (pain or burning when urinating, or frequent urination) *BOWEL PROBLEMS (unusual diarrhea, constipation, pain near the anus) TENDERNESS IN MOUTH AND THROAT WITH OR WITHOUT PRESENCE OF ULCERS (sore throat, sores in mouth, or a toothache) UNUSUAL RASH, SWELLING OR PAIN  UNUSUAL VAGINAL DISCHARGE OR ITCHING   Items with * indicate a potential emergency and should be followed up as soon as possible or go to the Emergency  Department if any problems should occur.  Please show the CHEMOTHERAPY ALERT CARD or IMMUNOTHERAPY ALERT CARD at check-in to the Emergency Department and triage nurse.  Should you have questions after your visit or need to cancel or reschedule your appointment, please contact [facility] CANCER CTR [place] - A DEPT OF Eligha Bridegroom [facility] [phone]  and follow the prompts.  Office hours are 8:00 a.m. to 4:30 p.m. Monday - Friday. Please note that voicemails left after 4:00 p.m. may not be returned until the following business day.  We are closed weekends and major holidays. You have access to a nurse at all times for urgent questions. Please call the main number to the clinic [phone] and follow the prompts.  For any non-urgent questions, you may also contact your provider using MyChart. We now offer e-Visits for anyone 88 and older to request care online for non-urgent symptoms. For details visit [email or web address].   Also download the MyChart app! Go to the app store, search "MyChart", open the app, select [place], and log in with your MyChart username and password.

## 2024-01-13 NOTE — Progress Notes (Signed)
 Patient presents today for chemotherapy infusion.  Patient is in satisfactory condition with no new complaints voiced.  Vital signs are stable.  Labs reviewed.  We will proceed with treatment per MD orders.    Patient tolerated treatment well with no complaints voiced.  Patient left ambulatory in stable condition.  Vital signs stable at discharge.  Follow up as scheduled.

## 2024-01-16 ENCOUNTER — Inpatient Hospital Stay

## 2024-01-16 ENCOUNTER — Inpatient Hospital Stay: Admitting: Dietician

## 2024-01-16 VITALS — BP 110/66 | HR 66 | Temp 97.8°F | Resp 18

## 2024-01-16 DIAGNOSIS — C9310 Chronic myelomonocytic leukemia not having achieved remission: Secondary | ICD-10-CM | POA: Diagnosis not present

## 2024-01-16 DIAGNOSIS — Z5111 Encounter for antineoplastic chemotherapy: Secondary | ICD-10-CM | POA: Diagnosis not present

## 2024-01-16 DIAGNOSIS — Z79899 Other long term (current) drug therapy: Secondary | ICD-10-CM | POA: Diagnosis not present

## 2024-01-16 DIAGNOSIS — D696 Thrombocytopenia, unspecified: Secondary | ICD-10-CM | POA: Diagnosis not present

## 2024-01-16 DIAGNOSIS — Z95828 Presence of other vascular implants and grafts: Secondary | ICD-10-CM

## 2024-01-16 LAB — CBC WITH DIFFERENTIAL/PLATELET
Abs Immature Granulocytes: 0.2 10*3/uL — ABNORMAL HIGH (ref 0.00–0.07)
Band Neutrophils: 2 %
Basophils Absolute: 0 10*3/uL (ref 0.0–0.1)
Basophils Relative: 0 %
Eosinophils Absolute: 0.1 10*3/uL (ref 0.0–0.5)
Eosinophils Relative: 6 %
HCT: 27.6 % — ABNORMAL LOW (ref 36.0–46.0)
Hemoglobin: 8.7 g/dL — ABNORMAL LOW (ref 12.0–15.0)
Lymphocytes Relative: 29 %
Lymphs Abs: 0.7 10*3/uL (ref 0.7–4.0)
MCH: 31.2 pg (ref 26.0–34.0)
MCHC: 31.5 g/dL (ref 30.0–36.0)
MCV: 98.9 fL (ref 80.0–100.0)
Metamyelocytes Relative: 6 %
Monocytes Absolute: 0 10*3/uL — ABNORMAL LOW (ref 0.1–1.0)
Monocytes Relative: 2 %
Myelocytes: 1 %
Neutro Abs: 1.3 10*3/uL — ABNORMAL LOW (ref 1.7–7.7)
Neutrophils Relative %: 54 %
Platelets: 114 10*3/uL — ABNORMAL LOW (ref 150–400)
RBC: 2.79 MIL/uL — ABNORMAL LOW (ref 3.87–5.11)
RDW: 19.1 % — ABNORMAL HIGH (ref 11.5–15.5)
WBC: 2.3 10*3/uL — ABNORMAL LOW (ref 4.0–10.5)
nRBC: 2.6 % — ABNORMAL HIGH (ref 0.0–0.2)

## 2024-01-16 LAB — COMPREHENSIVE METABOLIC PANEL WITH GFR
ALT: 16 U/L (ref 0–44)
AST: 24 U/L (ref 15–41)
Albumin: 3.8 g/dL (ref 3.5–5.0)
Alkaline Phosphatase: 56 U/L (ref 38–126)
Anion gap: 7 (ref 5–15)
BUN: 14 mg/dL (ref 8–23)
CO2: 27 mmol/L (ref 22–32)
Calcium: 9.3 mg/dL (ref 8.9–10.3)
Chloride: 104 mmol/L (ref 98–111)
Creatinine, Ser: 0.71 mg/dL (ref 0.44–1.00)
GFR, Estimated: >60 mL/min (ref >60)
Glucose, Bld: 102 mg/dL — ABNORMAL HIGH (ref 70–99)
Potassium: 4.1 mmol/L (ref 3.5–5.1)
Sodium: 138 mmol/L (ref 135–145)
Total Bilirubin: 1.1 mg/dL (ref 0.0–1.2)
Total Protein: 6.6 g/dL (ref 6.5–8.1)

## 2024-01-16 LAB — MAGNESIUM: Magnesium: 2 mg/dL (ref 1.7–2.4)

## 2024-01-16 MED ORDER — SODIUM CHLORIDE 0.9% FLUSH
10.0000 mL | Freq: Once | INTRAVENOUS | Status: AC
  Administered 2024-01-16: 10 mL via INTRAVENOUS

## 2024-01-16 MED ORDER — SODIUM CHLORIDE 0.9 % IV SOLN
75.0000 mg/m2 | Freq: Once | INTRAVENOUS | Status: DC
  Filled 2024-01-16: qty 12.6

## 2024-01-16 MED ORDER — SODIUM CHLORIDE 0.9 % IV SOLN: Freq: Once | INTRAVENOUS | Status: AC

## 2024-01-16 MED ORDER — PALONOSETRON HCL INJECTION 0.25 MG/5ML
0.2500 mg | Freq: Once | INTRAVENOUS | Status: AC
  Administered 2024-01-16: 0.25 mg via INTRAVENOUS
  Filled 2024-01-16: qty 5

## 2024-01-16 MED ORDER — SODIUM CHLORIDE 0.9 % IV SOLN
75.0000 mg/m2 | Freq: Once | INTRAVENOUS | Status: AC
  Administered 2024-01-16: 126 mg via INTRAVENOUS
  Filled 2024-01-16: qty 12.6

## 2024-01-16 MED ORDER — HEPARIN SOD (PORK) LOCK FLUSH 100 UNIT/ML IV SOLN
500.0000 [IU] | Freq: Once | INTRAVENOUS | Status: AC
  Administered 2024-01-16: 500 [IU] via INTRAVENOUS

## 2024-01-16 NOTE — Patient Instructions (Signed)
 [facility] - A DEPT OF [facility]  Discharge Instructions: Thank you for choosing [facility] to provide your oncology and hematology care.  If you have a lab appointment with the Cancer Center - please note that after April 8th, 2024, all labs will be drawn in the cancer center.  You do not have to check in or register with the main entrance as you have in the past but will complete your check-in in the cancer center.  Wear comfortable clothing and clothing appropriate for easy access to any Portacath or PICC line.   We strive to give you quality time with your provider. You may need to reschedule your appointment if you arrive late (15 or more minutes).  Arriving late affects you and other patients whose appointments are after yours.  Also, if you miss three or more appointments without notifying the office, you may be dismissed from the clinic at the provider's discretion.      For prescription refill requests, have your pharmacy contact our office and allow 72 hours for refills to be completed.    Today you received the following chemotherapy and/or immunotherapy agents D5 Vidaza    To help prevent nausea and vomiting after your treatment, we encourage you to take your nausea medication as directed.  BELOW ARE SYMPTOMS THAT SHOULD BE REPORTED IMMEDIATELY: *FEVER GREATER THAN 100.4 F (38 C) OR HIGHER *CHILLS OR SWEATING *NAUSEA AND VOMITING THAT IS NOT CONTROLLED WITH YOUR NAUSEA MEDICATION *UNUSUAL SHORTNESS OF BREATH *UNUSUAL BRUISING OR BLEEDING *URINARY PROBLEMS (pain or burning when urinating, or frequent urination) *BOWEL PROBLEMS (unusual diarrhea, constipation, pain near the anus) TENDERNESS IN MOUTH AND THROAT WITH OR WITHOUT PRESENCE OF ULCERS (sore throat, sores in mouth, or a toothache) UNUSUAL RASH, SWELLING OR PAIN  UNUSUAL VAGINAL DISCHARGE OR ITCHING   Items with * indicate a potential emergency and should be followed up as  soon as possible or go to the Emergency Department if any problems should occur.  Please show the CHEMOTHERAPY ALERT CARD or IMMUNOTHERAPY ALERT CARD at check-in to the Emergency Department and triage nurse.  Should you have questions after your visit or need to cancel or reschedule your appointment, please contact [facility] [place] - A DEPT OF [facility] [phone]  and follow the prompts.  Office hours are 8:00 a.m. to 4:30 p.m. Monday - Friday. Please note that voicemails left after 4:00 p.m. may not be returned until the following business day.  We are closed weekends and major holidays. You have access to a nurse at all times for urgent questions. Please call the main number to the clinic [phone] and follow the prompts.  For any non-urgent questions, you may also contact your provider using MyChart. We now offer e-Visits for anyone 102 and older to request care online for non-urgent symptoms. For details visit [email or web address].   Also download the MyChart app! Go to the app store, search "MyChart", open the app, select [facility], and log in with your MyChart username and password.

## 2024-01-16 NOTE — Progress Notes (Signed)
Patient presents today for D5 Vidaza infusion.  Patient is in satisfactory condition with no new complaints voiced.  Vital signs are stable.  Labs reviewed and all labs are within treatment parameters.  We will proceed with treatment per MD orders.    Treatment given today per MD orders. Tolerated infusion without adverse affects. Vital signs stable. No complaints at this time. Discharged from clinic ambulatory in stable condition. Alert and oriented x 3. F/U with [facility] as scheduled.

## 2024-01-17 ENCOUNTER — Inpatient Hospital Stay: Attending: Hematology

## 2024-01-17 VITALS — BP 112/71 | HR 69 | Temp 98.1°F | Resp 18

## 2024-01-17 DIAGNOSIS — Z881 Allergy status to other antibiotic agents status: Secondary | ICD-10-CM | POA: Diagnosis not present

## 2024-01-17 DIAGNOSIS — Z833 Family history of diabetes mellitus: Secondary | ICD-10-CM | POA: Insufficient documentation

## 2024-01-17 DIAGNOSIS — Z807 Family history of other malignant neoplasms of lymphoid, hematopoietic and related tissues: Secondary | ICD-10-CM | POA: Insufficient documentation

## 2024-01-17 DIAGNOSIS — R634 Abnormal weight loss: Secondary | ICD-10-CM | POA: Diagnosis not present

## 2024-01-17 DIAGNOSIS — C9310 Chronic myelomonocytic leukemia not having achieved remission: Secondary | ICD-10-CM | POA: Diagnosis not present

## 2024-01-17 DIAGNOSIS — Z882 Allergy status to sulfonamides status: Secondary | ICD-10-CM | POA: Diagnosis not present

## 2024-01-17 DIAGNOSIS — Z825 Family history of asthma and other chronic lower respiratory diseases: Secondary | ICD-10-CM | POA: Diagnosis not present

## 2024-01-17 DIAGNOSIS — Z8249 Family history of ischemic heart disease and other diseases of the circulatory system: Secondary | ICD-10-CM | POA: Insufficient documentation

## 2024-01-17 DIAGNOSIS — E538 Deficiency of other specified B group vitamins: Secondary | ICD-10-CM | POA: Diagnosis not present

## 2024-01-17 DIAGNOSIS — Z79899 Other long term (current) drug therapy: Secondary | ICD-10-CM | POA: Insufficient documentation

## 2024-01-17 DIAGNOSIS — Z800 Family history of malignant neoplasm of digestive organs: Secondary | ICD-10-CM | POA: Diagnosis not present

## 2024-01-17 DIAGNOSIS — Z832 Family history of diseases of the blood and blood-forming organs and certain disorders involving the immune mechanism: Secondary | ICD-10-CM | POA: Insufficient documentation

## 2024-01-17 DIAGNOSIS — D539 Nutritional anemia, unspecified: Secondary | ICD-10-CM | POA: Diagnosis not present

## 2024-01-17 DIAGNOSIS — D7581 Myelofibrosis: Secondary | ICD-10-CM | POA: Insufficient documentation

## 2024-01-17 DIAGNOSIS — Z83518 Family history of other specified eye disorder: Secondary | ICD-10-CM | POA: Insufficient documentation

## 2024-01-17 DIAGNOSIS — Z9089 Acquired absence of other organs: Secondary | ICD-10-CM | POA: Diagnosis not present

## 2024-01-17 DIAGNOSIS — D696 Thrombocytopenia, unspecified: Secondary | ICD-10-CM | POA: Insufficient documentation

## 2024-01-17 DIAGNOSIS — Z5111 Encounter for antineoplastic chemotherapy: Secondary | ICD-10-CM | POA: Diagnosis not present

## 2024-01-17 DIAGNOSIS — Z95828 Presence of other vascular implants and grafts: Secondary | ICD-10-CM

## 2024-01-17 DIAGNOSIS — Z818 Family history of other mental and behavioral disorders: Secondary | ICD-10-CM | POA: Diagnosis not present

## 2024-01-17 DIAGNOSIS — K5900 Constipation, unspecified: Secondary | ICD-10-CM | POA: Insufficient documentation

## 2024-01-17 MED ORDER — HEPARIN SOD (PORK) LOCK FLUSH 100 UNIT/ML IV SOLN
500.0000 [IU] | Freq: Once | INTRAVENOUS | Status: AC
  Administered 2024-01-17: 500 [IU] via INTRAVENOUS

## 2024-01-17 MED ORDER — SODIUM CHLORIDE 0.9 % IV SOLN: Freq: Once | INTRAVENOUS | Status: AC

## 2024-01-17 MED ORDER — SODIUM CHLORIDE 0.9% FLUSH
10.0000 mL | Freq: Once | INTRAVENOUS | Status: AC
  Administered 2024-01-17: 10 mL via INTRAVENOUS

## 2024-01-17 MED ORDER — SODIUM CHLORIDE 0.9 % IV SOLN
75.0000 mg/m2 | Freq: Once | INTRAVENOUS | Status: AC
  Administered 2024-01-17: 126 mg via INTRAVENOUS
  Filled 2024-01-17: qty 12.6

## 2024-01-17 NOTE — Progress Notes (Signed)
Patient tolerated chemotherapy with no complaints voiced. Side effects with management reviewed understanding verbalized. Port site clean and dry with no bruising or swelling noted at site. Good blood return noted before and after administration of chemotherapy.  Patient left in satisfactory condition with VSS and no s/s of distress noted. 

## 2024-01-17 NOTE — Patient Instructions (Signed)
 [facility] - A DEPT OF [facility]  Discharge Instructions: Thank you for choosing [facility] to provide your oncology and hematology care.  If you have a lab appointment with the Cancer Center - please note that after April 8th, 2024, all labs will be drawn in the cancer center.  You do not have to check in or register with the main entrance as you have in the past but will complete your check-in in the cancer center.  Wear comfortable clothing and clothing appropriate for easy access to any Portacath or PICC line.   We strive to give you quality time with your provider. You may need to reschedule your appointment if you arrive late (15 or more minutes).  Arriving late affects you and other patients whose appointments are after yours.  Also, if you miss three or more appointments without notifying the office, you may be dismissed from the clinic at the provider's discretion.      For prescription refill requests, have your pharmacy contact our office and allow 72 hours for refills to be completed.    Today you received the following chemotherapy and/or immunotherapy agents Vidaza, return as scheduled.   To help prevent nausea and vomiting after your treatment, we encourage you to take your nausea medication as directed.  BELOW ARE SYMPTOMS THAT SHOULD BE REPORTED IMMEDIATELY: *FEVER GREATER THAN 100.4 F (38 C) OR HIGHER *CHILLS OR SWEATING *NAUSEA AND VOMITING THAT IS NOT CONTROLLED WITH YOUR NAUSEA MEDICATION *UNUSUAL SHORTNESS OF BREATH *UNUSUAL BRUISING OR BLEEDING *URINARY PROBLEMS (pain or burning when urinating, or frequent urination) *BOWEL PROBLEMS (unusual diarrhea, constipation, pain near the anus) TENDERNESS IN MOUTH AND THROAT WITH OR WITHOUT PRESENCE OF ULCERS (sore throat, sores in mouth, or a toothache) UNUSUAL RASH, SWELLING OR PAIN  UNUSUAL VAGINAL DISCHARGE OR ITCHING   Items with * indicate a potential emergency and  should be followed up as soon as possible or go to the Emergency Department if any problems should occur.  Please show the CHEMOTHERAPY ALERT CARD or IMMUNOTHERAPY ALERT CARD at check-in to the Emergency Department and triage nurse.  Should you have questions after your visit or need to cancel or reschedule your appointment, please contact [facility]  - A DEPT OF [facility] [phone]  and follow the prompts.  Office hours are 8:00 a.m. to 4:30 p.m. Monday - Friday. Please note that voicemails left after 4:00 p.m. may not be returned until the following business day.  We are closed weekends and major holidays. You have access to a nurse at all times for urgent questions. Please call the main number to the clinic [phone] and follow the prompts.  For any non-urgent questions, you may also contact your provider using MyChart. We now offer e-Visits for anyone 68 and older to request care online for non-urgent symptoms. For details visit [email or web address].   Also download the MyChart app! Go to the app store, search "MyChart", open the app, select [place], and log in with your MyChart username and password.

## 2024-01-18 ENCOUNTER — Inpatient Hospital Stay

## 2024-01-18 VITALS — BP 112/56 | HR 69 | Temp 97.6°F | Resp 18

## 2024-01-18 DIAGNOSIS — C9310 Chronic myelomonocytic leukemia not having achieved remission: Secondary | ICD-10-CM | POA: Diagnosis not present

## 2024-01-18 DIAGNOSIS — D696 Thrombocytopenia, unspecified: Secondary | ICD-10-CM | POA: Diagnosis not present

## 2024-01-18 DIAGNOSIS — Z79899 Other long term (current) drug therapy: Secondary | ICD-10-CM | POA: Diagnosis not present

## 2024-01-18 DIAGNOSIS — D7581 Myelofibrosis: Secondary | ICD-10-CM | POA: Diagnosis not present

## 2024-01-18 DIAGNOSIS — D539 Nutritional anemia, unspecified: Secondary | ICD-10-CM | POA: Diagnosis not present

## 2024-01-18 DIAGNOSIS — R634 Abnormal weight loss: Secondary | ICD-10-CM | POA: Diagnosis not present

## 2024-01-18 DIAGNOSIS — E538 Deficiency of other specified B group vitamins: Secondary | ICD-10-CM | POA: Diagnosis not present

## 2024-01-18 DIAGNOSIS — Z95828 Presence of other vascular implants and grafts: Secondary | ICD-10-CM

## 2024-01-18 DIAGNOSIS — K5900 Constipation, unspecified: Secondary | ICD-10-CM | POA: Diagnosis not present

## 2024-01-18 DIAGNOSIS — Z5111 Encounter for antineoplastic chemotherapy: Secondary | ICD-10-CM | POA: Diagnosis not present

## 2024-01-18 MED ORDER — HEPARIN SOD (PORK) LOCK FLUSH 100 UNIT/ML IV SOLN
500.0000 [IU] | Freq: Once | INTRAVENOUS | Status: AC
  Administered 2024-01-18: 500 [IU] via INTRAVENOUS

## 2024-01-18 MED ORDER — SODIUM CHLORIDE 0.9% FLUSH
10.0000 mL | Freq: Once | INTRAVENOUS | Status: AC
  Administered 2024-01-18: 10 mL via INTRAVENOUS

## 2024-01-18 MED ORDER — PALONOSETRON HCL INJECTION 0.25 MG/5ML
0.2500 mg | Freq: Once | INTRAVENOUS | Status: AC
  Administered 2024-01-18: 0.25 mg via INTRAVENOUS

## 2024-01-18 MED ORDER — SODIUM CHLORIDE 0.9 % IV SOLN: Freq: Once | INTRAVENOUS | Status: AC

## 2024-01-18 MED ORDER — SODIUM CHLORIDE 0.9 % IV SOLN
75.0000 mg/m2 | Freq: Once | INTRAVENOUS | Status: AC
  Administered 2024-01-18: 126 mg via INTRAVENOUS
  Filled 2024-01-18: qty 12.6

## 2024-01-18 NOTE — Progress Notes (Signed)
Patient presents today for Vidaza infusion per providers order.  Vital signs WNL.  Patient has no new complaints at this time.  Treatment given today per MD orders.  Stable during infusion without adverse affects.  Vital signs stable.  No complaints at this time.  Discharge from clinic ambulatory in stable condition.  Alert and oriented X 3.  Follow up with [facility] as scheduled.

## 2024-01-18 NOTE — Patient Instructions (Signed)
 [facility] [place] [facility]  Discharge Instructions: Thank you for choosing [facility] to provide your oncology and hematology care.  If you have a lab appointment with the Cancer Center - please note that after April 8th, 2024, all labs will be drawn in the cancer center.  You do not have to check in or register with the main entrance as you have in the past but will complete your check-in in the cancer center.  Wear comfortable clothing and clothing appropriate for easy access to any Portacath or PICC line.   We strive to give you quality time with your provider. You may need to reschedule your appointment if you arrive late (15 or more minutes).  Arriving late affects you and other patients whose appointments are after yours.  Also, if you miss three or more appointments without notifying the office, you may be dismissed from the clinic at the provider's discretion.      For prescription refill requests, have your pharmacy contact our office and allow 72 hours for refills to be completed.    Today you received the following chemotherapy and/or immunotherapy agents Vidaza      To help prevent nausea and vomiting after your treatment, we encourage you to take your nausea medication as directed.  BELOW ARE SYMPTOMS THAT SHOULD BE REPORTED IMMEDIATELY: *FEVER GREATER THAN 100.4 F (38 C) OR HIGHER *CHILLS OR SWEATING *NAUSEA AND VOMITING THAT IS NOT CONTROLLED WITH YOUR NAUSEA MEDICATION *UNUSUAL SHORTNESS OF BREATH *UNUSUAL BRUISING OR BLEEDING *URINARY PROBLEMS (pain or burning when urinating, or frequent urination) *BOWEL PROBLEMS (unusual diarrhea, constipation, pain near the anus) TENDERNESS IN MOUTH AND THROAT WITH OR WITHOUT PRESENCE OF ULCERS (sore throat, sores in mouth, or a toothache) UNUSUAL RASH, SWELLING OR PAIN  UNUSUAL VAGINAL DISCHARGE OR ITCHING   Items with * indicate a potential emergency and should be followed up as  soon as possible or go to the Emergency Department if any problems should occur.  Please show the CHEMOTHERAPY ALERT CARD or IMMUNOTHERAPY ALERT CARD at check-in to the Emergency Department and triage nurse.  Should you have questions after your visit or need to cancel or reschedule your appointment, please contact [facility] [place] - A DEPT OF Eligha Bridegroom [facility] [phone]  and follow the prompts.  Office hours are 8:00 a.m. to 4:30 p.m. Monday - Friday. Please note that voicemails left after 4:00 p.m. may not be returned until the following business day.  We are closed weekends and major holidays. You have access to a nurse at all times for urgent questions. Please call the main number to the clinic [phone] and follow the prompts.  For any non-urgent questions, you may also contact your provider using MyChart. We now offer e-Visits for anyone 72 and older to request care online for non-urgent symptoms. For details visit [email or web address].   Also download the MyChart app! Go to the app store, search "MyChart", open the app, select [place], and log in with your MyChart username and password.

## 2024-01-23 ENCOUNTER — Ambulatory Visit

## 2024-01-23 ENCOUNTER — Other Ambulatory Visit: Payer: Self-pay | Admitting: Pharmacy Technician

## 2024-01-23 ENCOUNTER — Other Ambulatory Visit: Payer: Self-pay

## 2024-01-23 NOTE — Progress Notes (Signed)
 Specialty Pharmacy Refill Coordination Note  [name] is a 73 y.o. female contacted today regarding refills of specialty medication(s) Tezepelumab -ekko (Tezspire )   Patient requested Courier to Provider Office   Delivery date: 01/24/24   Verified address: [facility] [place]- [address]   Medication will be filled on 01/23/24.

## 2024-01-24 ENCOUNTER — Inpatient Hospital Stay

## 2024-01-24 VITALS — BP 104/78 | HR 64 | Temp 97.5°F | Resp 16

## 2024-01-24 DIAGNOSIS — Z5111 Encounter for antineoplastic chemotherapy: Secondary | ICD-10-CM | POA: Diagnosis not present

## 2024-01-24 DIAGNOSIS — C9310 Chronic myelomonocytic leukemia not having achieved remission: Secondary | ICD-10-CM | POA: Diagnosis not present

## 2024-01-24 DIAGNOSIS — K5900 Constipation, unspecified: Secondary | ICD-10-CM | POA: Diagnosis not present

## 2024-01-24 DIAGNOSIS — R634 Abnormal weight loss: Secondary | ICD-10-CM | POA: Diagnosis not present

## 2024-01-24 DIAGNOSIS — Z79899 Other long term (current) drug therapy: Secondary | ICD-10-CM | POA: Diagnosis not present

## 2024-01-24 DIAGNOSIS — D539 Nutritional anemia, unspecified: Secondary | ICD-10-CM | POA: Diagnosis not present

## 2024-01-24 DIAGNOSIS — Z95828 Presence of other vascular implants and grafts: Secondary | ICD-10-CM

## 2024-01-24 DIAGNOSIS — D7581 Myelofibrosis: Secondary | ICD-10-CM | POA: Diagnosis not present

## 2024-01-24 DIAGNOSIS — D696 Thrombocytopenia, unspecified: Secondary | ICD-10-CM | POA: Diagnosis not present

## 2024-01-24 DIAGNOSIS — E538 Deficiency of other specified B group vitamins: Secondary | ICD-10-CM | POA: Diagnosis not present

## 2024-01-24 LAB — SAMPLE TO BLOOD BANK

## 2024-01-24 LAB — CBC
HCT: 27.4 % — ABNORMAL LOW (ref 36.0–46.0)
Hemoglobin: 8.4 g/dL — ABNORMAL LOW (ref 12.0–15.0)
MCH: 30.3 pg (ref 26.0–34.0)
MCHC: 30.7 g/dL (ref 30.0–36.0)
MCV: 98.9 fL (ref 80.0–100.0)
Platelets: 81 K/uL — ABNORMAL LOW (ref 150–400)
RBC: 2.77 MIL/uL — ABNORMAL LOW (ref 3.87–5.11)
RDW: 19.6 % — ABNORMAL HIGH (ref 11.5–15.5)
WBC: 2.4 K/uL — ABNORMAL LOW (ref 4.0–10.5)
nRBC: 3 % — ABNORMAL HIGH (ref 0.0–0.2)

## 2024-01-24 MED ORDER — HEPARIN SOD (PORK) LOCK FLUSH 100 UNIT/ML IV SOLN
500.0000 [IU] | Freq: Once | INTRAVENOUS | Status: AC
  Administered 2024-01-24: 500 [IU] via INTRAVENOUS

## 2024-01-24 MED ORDER — SODIUM CHLORIDE FLUSH 0.9 % IV SOLN
10.0000 mL | Freq: Once | INTRAVENOUS | Status: AC
  Administered 2024-01-24: 10 mL via INTRAVENOUS
  Filled 2024-01-24: qty 10

## 2024-01-24 NOTE — Patient Instructions (Signed)
 [facility] [place] - A DEPT OF [facility]  Discharge Instructions: Thank you for choosing [facility] to provide your oncology and hematology care.  If you have a lab appointment with the Cancer Center - please note that after April 8th, 2024, all labs will be drawn in the cancer center.  You do not have to check in or register with the main entrance as you have in the past but will complete your check-in in the cancer center.  Wear comfortable clothing and clothing appropriate for easy access to any Portacath or PICC line.   We strive to give you quality time with your provider. You may need to reschedule your appointment if you arrive late (15 or more minutes).  Arriving late affects you and other patients whose appointments are after yours.  Also, if you miss three or more appointments without notifying the office, you may be dismissed from the clinic at the provider's discretion.      For prescription refill requests, have your pharmacy contact our office and allow 72 hours for refills to be completed.    Today you received the following port flush with lab draw, return as scheduled.   To help prevent nausea and vomiting after your treatment, we encourage you to take your nausea medication as directed.  BELOW ARE SYMPTOMS THAT SHOULD BE REPORTED IMMEDIATELY: *FEVER GREATER THAN 100.4 F (38 C) OR HIGHER *CHILLS OR SWEATING *NAUSEA AND VOMITING THAT IS NOT CONTROLLED WITH YOUR NAUSEA MEDICATION *UNUSUAL SHORTNESS OF BREATH *UNUSUAL BRUISING OR BLEEDING *URINARY PROBLEMS (pain or burning when urinating, or frequent urination) *BOWEL PROBLEMS (unusual diarrhea, constipation, pain near the anus) TENDERNESS IN MOUTH AND THROAT WITH OR WITHOUT PRESENCE OF ULCERS (sore throat, sores in mouth, or a toothache) UNUSUAL RASH, SWELLING OR PAIN  UNUSUAL VAGINAL DISCHARGE OR ITCHING   Items with * indicate a potential emergency and should be followed up as  soon as possible or go to the Emergency Department if any problems should occur.  Please show the CHEMOTHERAPY ALERT CARD or IMMUNOTHERAPY ALERT CARD at check-in to the Emergency Department and triage nurse.  Should you have questions after your visit or need to cancel or reschedule your appointment, please contact [facility] [place] - A DEPT OF [facility] [phone]  and follow the prompts.  Office hours are 8:00 a.m. to 4:30 p.m. Monday - Friday. Please note that voicemails left after 4:00 p.m. may not be returned until the following business day.  We are closed weekends and major holidays. You have access to a nurse at all times for urgent questions. Please call the main number to the clinic [phone] and follow the prompts.  For any non-urgent questions, you may also contact your provider using MyChart. We now offer e-Visits for anyone 7 and older to request care online for non-urgent symptoms. For details visit [email or web address].   Also download the MyChart app! Go to the app store, search "MyChart", open the app, select [place], and log in with your MyChart username and password.

## 2024-01-24 NOTE — Progress Notes (Signed)
 Port flushed with good blood return noted. No bruising or swelling at site. Bandaid applied and patient discharged in satisfactory condition. VVS stable with no signs or symptoms of distressed noted.

## 2024-01-25 ENCOUNTER — Inpatient Hospital Stay

## 2024-01-25 ENCOUNTER — Ambulatory Visit

## 2024-01-25 DIAGNOSIS — J4550 Severe persistent asthma, uncomplicated: Secondary | ICD-10-CM

## 2024-01-30 ENCOUNTER — Other Ambulatory Visit: Payer: Self-pay | Admitting: Hematology

## 2024-01-30 ENCOUNTER — Ambulatory Visit

## 2024-01-31 DIAGNOSIS — C9200 Acute myeloblastic leukemia, not having achieved remission: Secondary | ICD-10-CM | POA: Diagnosis not present

## 2024-01-31 DIAGNOSIS — Z7409 Other reduced mobility: Secondary | ICD-10-CM | POA: Diagnosis not present

## 2024-01-31 DIAGNOSIS — Z7682 Awaiting organ transplant status: Secondary | ICD-10-CM | POA: Diagnosis not present

## 2024-01-31 DIAGNOSIS — Z0189 Encounter for other specified special examinations: Secondary | ICD-10-CM | POA: Diagnosis not present

## 2024-01-31 DIAGNOSIS — Z01818 Encounter for other preprocedural examination: Secondary | ICD-10-CM | POA: Diagnosis not present

## 2024-01-31 DIAGNOSIS — C9310 Chronic myelomonocytic leukemia not having achieved remission: Secondary | ICD-10-CM | POA: Diagnosis not present

## 2024-02-03 ENCOUNTER — Ambulatory Visit
Admission: EM | Admit: 2024-02-03 | Discharge: 2024-02-03 | Disposition: A | Discharge status: Home / Self Care | Attending: Nurse Practitioner | Admitting: Nurse Practitioner

## 2024-02-03 ENCOUNTER — Ambulatory Visit (INDEPENDENT_AMBULATORY_CARE_PROVIDER_SITE_OTHER)

## 2024-02-03 DIAGNOSIS — M79641 Pain in right hand: Secondary | ICD-10-CM

## 2024-02-03 DIAGNOSIS — M19041 Primary osteoarthritis, right hand: Secondary | ICD-10-CM | POA: Diagnosis not present

## 2024-02-03 DIAGNOSIS — M85841 Other specified disorders of bone density and structure, right hand: Secondary | ICD-10-CM | POA: Diagnosis not present

## 2024-02-03 MED ORDER — MELOXICAM 5 MG PO CAPS: 1.0000 | ORAL_CAPSULE | Freq: Every day | ORAL | 0 refills | Status: DC

## 2024-02-03 NOTE — ED Provider Notes (Signed)
 [facility]    CSN: [id number] Arrival date & time: 02/03/24  0802      History   Chief Complaint Chief Complaint  Patient presents with   Hand Pain    HPI [name] is a 73 y.o. female.   The history is provided by the patient.   Patient presents for complaints of right hand pain has been present for the past week.  Patient states that the pain is located at the 4th and 5th digits, and can radiate up into her right forearm.  Patient states that she sometimes has difficulty moving her hand.  She denies injury, trauma, swelling, bruising, or redness.  She further denies numbness, tingling, or weakness.  She states that she is concerned that she may have arthritis.  Patient reports that she does have underlying history of leukemia and wants to make sure that she is well as she is preparing for a bone marrow transplant.  Patient states she has not taken any medication for her symptoms.  Past Medical History:  Diagnosis Date   Anxiety    Asthma    had 1 episode 1 year ago-no more problem   CHF (congestive heart failure) (HCC)    swelling of feet & ankles   CMML (chronic myelomonocytic leukemia) (HCC) 06/03/2022   Cough    Depression    OCD   GERD (gastroesophageal reflux disease)    severe   OCD (obsessive compulsive disorder)    Port-A-Cath in place 07/26/2022   Shortness of breath    with exertion   Yeast infection 04/09/2014    Patient Active Problem List   Diagnosis Date Noted   Port-A-Cath in place 07/26/2022   CMML (chronic myelomonocytic leukemia) (HCC) 06/03/2022   Thrombocytopenia (HCC) 04/26/2022   Macrocytic anemia 04/26/2022   Episodic recurrent vertigo 04/20/2022   Asthma 01/12/2021   Hoarseness 03/14/2018   Perennial allergic rhinitis with a possible nonallergic component 10/04/2017   Moderate persistent asthma 10/04/2017   Oropharyngeal dysphagia 05/06/2017   Gastroesophageal reflux disease without esophagitis    Constipation  11/26/2015   Yeast infection 04/09/2014   Obesity (BMI 30-39.9) 01/31/2014   Cough, persistent    CHF (congestive heart failure) (HCC)    Depression    Anxiety    Encounter for screening colonoscopy 05/28/2012   GERD (gastroesophageal reflux disease) 05/28/2012   Low back pain 06/17/2011    Past Surgical History:  Procedure Laterality Date   CARPAL TUNNEL RELEASE  03/22/2012   Procedure: CARPAL TUNNEL RELEASE;  Surgeon: Arley JONELLE Curia, MD;  Location: [facility];  Service: Orthopedics;  Laterality: Right;  right carpal tunnel release   COLONOSCOPY  06/19/2012   Dr. Rourk:normal rectum and colon    ESOPHAGOGASTRODUODENOSCOPY N/A 12/26/2015   Dr. Shaaron: normal    IR IMAGING GUIDED PORT INSERTION  07/30/2022   TONSILLECTOMY     TUBAL LIGATION      OB History     Gravida  1   Para  1   Term      Preterm      AB      Living  1      SAB      IAB      Ectopic      Multiple      Live Births               Home Medications    Prior to Admission medications   Medication Sig Start Date  End Date Taking? Authorizing Provider  albuterol  (VENTOLIN  HFA) 108 (90 Base) MCG/ACT inhaler INHALE TWO PUFFS EVERY 4 HOURS AS NEEDED FOR WHEEZING OR FOR SHORTNESS OF BREATH 07/12/23  Yes Padgett, Danita Macintosh, MD  BREZTRI  AEROSPHERE 160-9-4.8 MCG/ACT AERO Inhale 2 puffs into the lungs 2 (two) times daily. 10/07/23  Yes Padgett, Danita Macintosh, MD  famotidine  (PEPCID ) 20 MG tablet Take 1 tablet (20 mg total) by mouth 2 (two) times daily. 10/07/23  Yes Padgett, Danita Macintosh, MD  FLUoxetine (PROZAC) 20 MG capsule Take 1 capsule by mouth daily.   Yes [provider]  folic acid  (FOLVITE ) 1 MG tablet TAKE ONE TABLET BY MOUTH ONCE DAILY 01/30/24  Yes Katragadda, Sreedhar, MD  gabapentin (NEURONTIN) 300 MG capsule  08/10/18  Yes [provider]  montelukast  (SINGULAIR ) 10 MG tablet Take 1 tablet (10 mg total) by mouth at bedtime. 10/07/23  Yes Padgett,  Danita Macintosh, MD  pantoprazole  (PROTONIX ) 40 MG tablet TAKE ONE TABLET BY MOUTH ONCE DAILY 11/07/23  Yes Padgett, Danita Macintosh, MD  traZODone  (DESYREL ) 50 MG tablet TAKE ONE TABLET BY MOUTH AT BEDTIME 10/31/23  Yes [name], MD  albuterol  (VENTOLIN  HFA) 108 (90 Base) MCG/ACT inhaler INHALE 2 PUFFS BY MOUTH EVERY 4 HOURS AS NEEDED FOR SHORTNESS OF BREATH OR WHEEZING. 10/07/23   Jeneal Danita Macintosh, MD  amoxicillin -clavulanate (AUGMENTIN ) 875-125 MG tablet Take 1 tablet by mouth 2 (two) times daily. 12/19/23   [name], MD  azaCITIDine  5 mg/2 mLs in lactated ringers  infusion Inject into the vein daily. Days 1-7 every 28 days 08/02/22   [provider]  azelastine  (ASTELIN ) 0.1 % nasal spray SPRAY 1 TO 2 SPRAYS PER NOSTRIL TWICE DAILY. 08/10/19   Bobbitt, Elgin Pepper, MD  benzonatate  (TESSALON ) 200 MG capsule Take 200 mg by mouth 3 (three) times daily as needed. 11/01/22   [provider]  cetirizine  (ZYRTEC ) 5 MG tablet Take 1 tablet (5 mg total) by mouth daily. 10/31/21   Christopher Savannah, PA-C  ciclopirox  (PENLAC ) 8 % solution APPLY TOPICALLY AT BEDTIME. APPLY OVER NAIL FOLD AND SURROUNDING SKIN. APPLY DAILY OVER PREVIOUS COAT. AFTER SEVEN DAYS, MAY REMOVE WITH ALCOHOL AND CONTINUE CYCLE. 02/22/23   Standiford, Marsa FALCON, DPM  EPINEPHrine  0.3 mg/0.3 mL IJ SOAJ injection Inject 0.3 mg into the muscle as needed for anaphylaxis. 08/07/20   Iva Marty Saltness, MD  fluticasone  (FLONASE ) 50 MCG/ACT nasal spray Place 2 sprays into both nostrils 2 (two) times daily. 10/07/23   Jeneal Danita Macintosh, MD  HYDROcodone  bit-homatropine [facility]) 5-1.5 MG/5ML syrup  07/14/22   [provider]  ipratropium (ATROVENT ) 0.06 % nasal spray USE TWO SPRAYS IN EACH NOSTRIL THREE TIMES DAILY AS NEEDED 10/31/23   Jeneal Danita Macintosh, MD  lactulose  (CHRONULAC ) 10 GM/15ML solution Take 15 mLs (10 g total) by mouth every 3 (three) hours as needed for mild  constipation. 09/08/23   [name], MD  lidocaine -prilocaine  (EMLA ) cream Apply a quarter sized amount to port a cath site and cover with plastic wrap one hour prior to infusion appointments 07/29/22   [name], MD  megestrol  (MEGACE ) 400 MG/10ML suspension Take 10 mLs (400 mg total) by mouth 2 (two) times daily. 07/13/22   [name], MD  NON FORMULARY [facility]  Antifungal (nail)-#1    [provider]  prochlorperazine  (COMPAZINE ) 10 MG tablet Take 1 tablet (10 mg total) by mouth every 6 (six) hours as needed for nausea or vomiting. 07/13/22   [name], MD  Respiratory Therapy Supplies (FLUTTER) DEVI Use as directed 10/04/17   Bobbitt, Elgin Pepper, MD  SF 5000 PLUS 1.1 % CREA dental cream  01/29/19   [provider]  Spacer/Aero-Holding Chambers (AEROCHAMBER PLUS WITH MASK) inhaler 1 each by Other route See admin instructions. Use with inhaler as instructed. 02/20/20   Jeneal Danita Macintosh, MD  Tezepelumab -ekko (TEZSPIRE ) 210 MG/1. SOAJ Inject 210 mg into the skin every 28 (twenty-eight) days. 09/13/23   Jeneal Danita Macintosh, MD  tiZANidine  (ZANAFLEX ) 2 MG tablet Take 1 tablet (2 mg total) by mouth 2 (two) times daily as needed for muscle spasms. 03/19/23   Leath-Warren, Etta PARAS, NP    Family History Family History  Problem Relation Age of Onset   Asthma Mother    Macular degeneration Mother    Hypertension Mother    Stomach cancer Mother 35   Alzheimer's disease Father    Other Sister        blood clots   Diabetes Maternal Grandfather    Diabetes Paternal Grandfather    Other Son        enlarged spleen   Lymphoma Cousin        x2, both dx in their 50s   Colon cancer Neg Hx     Social History Social History   Tobacco Use   Smoking status: Never    Passive exposure: Yes   Smokeless tobacco: Never  Vaping Use   Vaping status: Never Used  Substance Use Topics   Alcohol use: No   Drug use: No      Allergies   Levonorgestrel-ethinyl estrad, Sulfa antibiotics, Sulfamethoxazole-trimethoprim, and Cephalosporins   Review of Systems Review of Systems Per HPI  Physical Exam Triage Vital Signs ED Triage Vitals  Encounter Vitals Group     BP 02/03/24 0815 111/74     Girls Systolic BP Percentile --      Girls Diastolic BP Percentile --      Boys Systolic BP Percentile --      Boys Diastolic BP Percentile --      Pulse Rate 02/03/24 0815 80     Resp 02/03/24 0815 16     Temp 02/03/24 0815 98.5 F (36.9 C)     Temp Source 02/03/24 0815 Oral     SpO2 02/03/24 0815 94 %     Weight --      Height --      Head Circumference --      Peak Flow --      Pain Score 02/03/24 0818 3     Pain Loc --      Pain Education --      Exclude from Growth Chart --    No data found.  Updated Vital Signs BP 111/74 (BP Location: Right Arm)   Pulse 80   Temp 98.5 F (36.9 C) (Oral)   Resp 16   SpO2 94%   Visual Acuity Right Eye Distance:   Left Eye Distance:   Bilateral Distance:    Right Eye Near:   Left Eye Near:    Bilateral Near:     Physical Exam Vitals and nursing note reviewed.  Constitutional:      General: She is not in acute distress.    Appearance: Normal appearance.  HENT:     Head: Normocephalic.     Nose: Nose normal.  Eyes:     Extraocular Movements: Extraocular movements intact.     Pupils: Pupils are equal, round, and reactive to light.  Pulmonary:     Effort: Pulmonary effort is normal.  Musculoskeletal:     Right hand: Tenderness present. No swelling. Normal range of motion. Normal strength. Normal sensation. Normal capillary refill. Normal pulse.     Cervical back: Normal range of motion.     Comments: Tenderness noted to the 4th and 5th metacarpals of the right hand.  There is no obvious redness, swelling, or bruising present.  Grip strength is equal bilaterally.  Skin:    General: Skin is warm and dry.  Neurological:     General: No focal deficit  present.     Mental Status: She is alert and oriented to person, place, and time.  Psychiatric:        Mood and Affect: Mood normal.        Behavior: Behavior normal.      UC Treatments / Results  Labs (all labs ordered are listed, but only abnormal results are displayed) Labs Reviewed - No data to display  EKG   Radiology DG Hand Complete Right Result Date: 02/03/2024 CLINICAL DATA:  right hand pain x 1 week. EXAM: RIGHT HAND - COMPLETE 3+ VIEW COMPARISON:  None Available. FINDINGS: There is diffuse osteopenia of the visualized osseous structures. No acute fracture or dislocation. No aggressive osseous lesion. Note is made of negative ulnar variance. Mild diffuse degenerative changes of imaged joints. No periarticular osteopenia or bony erosions. No periarticular soft tissue calcifications. No radiopaque foreign bodies. Soft tissues are within normal limits. IMPRESSION: No acute osseous abnormality of the right hand. Electronically Signed   By: Ree Molt M.D.   On: 02/03/2024 08:58    Procedures Procedures (including critical care time)  Medications Ordered in UC Medications - No data to display  Initial Impression / Assessment and Plan / UC Course  I have reviewed the triage vital signs and the nursing notes.  Pertinent labs & imaging results that were available during my care of the patient were reviewed by me and considered in my medical decision making (see chart for details).  X-ray of the right hand was negative for fracture dislocation, does show some mild degenerative changes.  An Ace wrap was provided to allow for additional compression and support.  Will treat with meloxicam 5 mg to help with inflammation.  Supportive care recommendations were provided and discussed with the patient to include over-the-counter Tylenol , hand exercises, and RICE therapy.  Discussed indications with patient regarding follow-up.  Patient was in agreement with this plan of care and  verbalizes understanding.  All questions were answered.  Patient stable for discharge.   Final Clinical Impressions(s) / UC Diagnoses   Final diagnoses:  Right hand pain     Discharge Instructions      The x-ray was negative for fracture or dislocation.     ED Prescriptions   None    PDMP not reviewed this encounter.   Gilmer Etta PARAS, NP 02/03/24 [phone]

## 2024-02-03 NOTE — ED Triage Notes (Signed)
 Pt states she is having pain in her right pinky finger that shoots up to her elbow x 1 week. No recent injuries.

## 2024-02-03 NOTE — Discharge Instructions (Addendum)
 The x-ray was negative for fracture or dislocation.  There are some mild degenerative changes which could be causing your pain. Take medication as prescribed. You may take over-the-counter Tylenol  for breakthrough pain or discomfort. Recommend RICE therapy, rest, ice, compression, and elevation.  An Ace wrap has been provided to allow for additional compression and support.  Apply ice to help with pain or swelling.  Apply for 20 minutes, remove for 1 hour, repeat as needed.  If needed, you can also try heat in the same manner. I have provided hand exercises for you to perform at least 2-3 times daily.  Also recommend use of a squeezy ball to help exercise your hand.   Recommend gentle stretching exercises of the right hand while symptoms persist. As discussed, if symptoms fail to improve, recommend follow-up with orthopedist, particularly a hand specialist, for further evaluation. Follow-up as needed.

## 2024-02-06 ENCOUNTER — Inpatient Hospital Stay

## 2024-02-06 ENCOUNTER — Inpatient Hospital Stay (HOSPITAL_BASED_OUTPATIENT_CLINIC_OR_DEPARTMENT_OTHER): Admitting: Hematology

## 2024-02-06 ENCOUNTER — Encounter: Payer: Self-pay | Admitting: Hematology

## 2024-02-06 VITALS — BP 120/70 | HR 65 | Temp 97.1°F | Resp 18

## 2024-02-06 DIAGNOSIS — Z5111 Encounter for antineoplastic chemotherapy: Secondary | ICD-10-CM | POA: Diagnosis not present

## 2024-02-06 DIAGNOSIS — D7581 Myelofibrosis: Secondary | ICD-10-CM | POA: Diagnosis not present

## 2024-02-06 DIAGNOSIS — K5900 Constipation, unspecified: Secondary | ICD-10-CM | POA: Diagnosis not present

## 2024-02-06 DIAGNOSIS — D696 Thrombocytopenia, unspecified: Secondary | ICD-10-CM | POA: Diagnosis not present

## 2024-02-06 DIAGNOSIS — Z79899 Other long term (current) drug therapy: Secondary | ICD-10-CM | POA: Diagnosis not present

## 2024-02-06 DIAGNOSIS — C9310 Chronic myelomonocytic leukemia not having achieved remission: Secondary | ICD-10-CM | POA: Diagnosis not present

## 2024-02-06 DIAGNOSIS — E538 Deficiency of other specified B group vitamins: Secondary | ICD-10-CM | POA: Diagnosis not present

## 2024-02-06 DIAGNOSIS — D539 Nutritional anemia, unspecified: Secondary | ICD-10-CM | POA: Diagnosis not present

## 2024-02-06 DIAGNOSIS — R634 Abnormal weight loss: Secondary | ICD-10-CM | POA: Diagnosis not present

## 2024-02-06 DIAGNOSIS — C9200 Acute myeloblastic leukemia, not having achieved remission: Secondary | ICD-10-CM | POA: Diagnosis not present

## 2024-02-06 DIAGNOSIS — Z95828 Presence of other vascular implants and grafts: Secondary | ICD-10-CM

## 2024-02-06 LAB — CBC WITH DIFFERENTIAL/PLATELET
Abs Immature Granulocytes: 0.1 K/uL — ABNORMAL HIGH (ref 0.00–0.07)
Band Neutrophils: 7 %
Basophils Absolute: 0 K/uL (ref 0.0–0.1)
Basophils Relative: 0 %
Eosinophils Absolute: 0 K/uL (ref 0.0–0.5)
Eosinophils Relative: 1 %
HCT: 27.8 % — ABNORMAL LOW (ref 36.0–46.0)
Hemoglobin: 8.8 g/dL — ABNORMAL LOW (ref 12.0–15.0)
Lymphocytes Relative: 32 %
Lymphs Abs: 0.7 K/uL (ref 0.7–4.0)
MCH: 32 pg (ref 26.0–34.0)
MCHC: 31.7 g/dL (ref 30.0–36.0)
MCV: 101.1 fL — ABNORMAL HIGH (ref 80.0–100.0)
Monocytes Absolute: 0.1 K/uL (ref 0.1–1.0)
Monocytes Relative: 3 %
Myelocytes: 3 %
Neutro Abs: 1.3 K/uL — ABNORMAL LOW (ref 1.7–7.7)
Neutrophils Relative %: 53 %
Platelets: 147 K/uL — ABNORMAL LOW (ref 150–400)
Promyelocytes Relative: 1 %
RBC: 2.75 MIL/uL — ABNORMAL LOW (ref 3.87–5.11)
RDW: 20.6 % — ABNORMAL HIGH (ref 11.5–15.5)
WBC: 2.2 K/uL — ABNORMAL LOW (ref 4.0–10.5)
nRBC: 2.3 % — ABNORMAL HIGH (ref 0.0–0.2)

## 2024-02-06 LAB — COMPREHENSIVE METABOLIC PANEL WITH GFR
ALT: 20 U/L (ref 0–44)
AST: 34 U/L (ref 15–41)
Albumin: 3.8 g/dL (ref 3.5–5.0)
Alkaline Phosphatase: 49 U/L (ref 38–126)
Anion gap: 7 (ref 5–15)
BUN: 10 mg/dL (ref 8–23)
CO2: 26 mmol/L (ref 22–32)
Calcium: 8.9 mg/dL (ref 8.9–10.3)
Chloride: 103 mmol/L (ref 98–111)
Creatinine, Ser: 0.83 mg/dL (ref 0.44–1.00)
GFR, Estimated: >60 mL/min (ref >60)
Glucose, Bld: 116 mg/dL — ABNORMAL HIGH (ref 70–99)
Potassium: 3.7 mmol/L (ref 3.5–5.1)
Sodium: 136 mmol/L (ref 135–145)
Total Bilirubin: 1.1 mg/dL (ref 0.0–1.2)
Total Protein: 6.7 g/dL (ref 6.5–8.1)

## 2024-02-06 LAB — MAGNESIUM: Magnesium: 1.9 mg/dL (ref 1.7–2.4)

## 2024-02-06 LAB — LACTATE DEHYDROGENASE: LDH: 456 U/L — ABNORMAL HIGH (ref 98–192)

## 2024-02-06 MED ORDER — SODIUM CHLORIDE 0.9 % IV SOLN
75.0000 mg/m2 | Freq: Once | INTRAVENOUS | Status: AC
  Administered 2024-02-06: 126 mg via INTRAVENOUS
  Filled 2024-02-06: qty 12.6

## 2024-02-06 MED ORDER — SODIUM CHLORIDE 0.9 % IV SOLN
Freq: Once | INTRAVENOUS | Status: AC
Start: 2024-02-06 — End: 2024-02-06

## 2024-02-06 MED ORDER — PALONOSETRON HCL INJECTION 0.25 MG/5ML
0.2500 mg | Freq: Once | INTRAVENOUS | Status: AC
  Administered 2024-02-06: 0.25 mg via INTRAVENOUS
  Filled 2024-02-06: qty 5

## 2024-02-06 MED ORDER — SODIUM CHLORIDE 0.9% FLUSH
10.0000 mL | Freq: Once | INTRAVENOUS | Status: AC
Start: 2024-02-06 — End: 2024-02-06
  Administered 2024-02-06: 10 mL via INTRAVENOUS

## 2024-02-06 MED ORDER — HEPARIN SOD (PORK) LOCK FLUSH 100 UNIT/ML IV SOLN
500.0000 [IU] | Freq: Once | INTRAVENOUS | Status: AC
  Administered 2024-02-06: 500 [IU] via INTRAVENOUS

## 2024-02-06 MED ORDER — SODIUM CHLORIDE 0.9% FLUSH
10.0000 mL | INTRAVENOUS | Status: AC
  Administered 2024-02-06: 10 mL

## 2024-02-06 NOTE — Progress Notes (Signed)
Patient presents today for D1 Vidaza infusion. Patient is in satisfactory condition with no new complaints voiced.  Vital signs are stable.  Labs reviewed by Dr. Ellin Saba during the office visit and all labs are within treatment parameters.  We will proceed with treatment per MD orders.   Treatment given today per MD orders. Tolerated infusion without adverse affects. Vital signs stable. No complaints at this time. Discharged from clinic ambulatory in stable condition. Alert and oriented x 3. F/U with [facility] as scheduled.

## 2024-02-06 NOTE — Patient Instructions (Signed)

## 2024-02-06 NOTE — Progress Notes (Signed)
 Pre-Stem Cell Transplant Psychosocial Consultation   [name]  [id number]  1951-03-19   02/06/2024  Time: [phone] 45 minutes [phone]  Location Information: Patient State (at time of visit): [place]  Patient Location (at time of visit):Home/Other Non-Medical  Provider Location: Hospital/Provider-Based Clinic Is provider licensed to provide clinical care in the current location/state of the patient? Yes   Consent:  Patient's identity was confirmed. Presenting condition or illness was discussed with the patient/personal representative. Current proposed treatment for presenting condition or illness was explained to patient/personal representative along with the likely benefits and any significant risks or complications associated with the provision of treatment by audio/video means. The patient/personal representative verbally authorized treatment to be provided by audio/video, which may include a limited review of patient's current health status, medication, or other treatment recommendations, patient education, and an opportunity to ask questions about condition and treatment. Verbal Consent Granted by Patient/Personal Representative:Yes   Visit Information: Modality: Audio-Only  Time Spent on Phone w/ Patient: 45 minutes   Electronic medical records were reviewed selectively.   HISTORY OF PRESENTING CONCERN Referral: [name] presents for a standard of care pre-transplant psychosocial conversation. Briefly, pt is a 72y/o female residing in [place], [place] independently. 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 She requested a phone visit and engaged in a thoughtful interpersonal fashion.  Below, please find initial impressions and additional information.  Mental Status Exam:   General Appearance UTA-telephone/video encounter  General Behavior cooperative and pleasant  Psychomotor Activity  UTA-telephone/video  Gait and Station UTA-telephone/video encounter  Speech   normal fluency, normal volume, normal tone, and normal rate  Mood   euthymic  Affect    congruent  Thought Process linear, coherent, goal directed  Associations Intact  Thought Content/Perceptual Disturbances denies SI/HI/AVH and symptoms are not consistent with mania or psychosis  Cognition/Sensorium  orientation intact (AAOx4)  Insight  age appropriate  Judgment age appropriate  NARRATIVE [name] presents today to discuss emotional adaptation in advance of stem cell transplant. 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 Taralee reports that she has been tolerating treatment incredibly well. She is fully independent in her activities of daily living. She enjoys mowing her grass, weed eating and cleaning her home. Her son works at [employer] and largely performs her shopping for her; however she is able to drive to the store to get her groceries if needed. She reports having a good appetite and is sleeping well. She notes some brain fog that results in difficulty completing sentences at times.  After both her father and husband experienced Alzheimer's, [name] reports feeling grateful that she is navigating cancer versus Alzheimer's.  Recent MOCA testing within the geriatric clinic revealed a score below normal limits; however she does not believe that this is an accurate reading of her capabilities and current functioning.  As a very independent person, [name] revealed that the cancer diagnosis originally upset her.  With time, she was able to mentally create a path forward. She has been heartened that she is able to continue with desired activities of daily living. She continues to work in a part time capacity and performs necessary tasks around her home. She believes that attitude plays a  large role in outcomes. As such, she is trying to stay positive  and to accept what comes her way. She is very strong willed but also works to accept what transpires. Her religious beliefs help her in this realm. She is placing her circumstances in God's hands and finds comfort in this belief. Being only human, she does have some days when she feels sad or concerned. Yet she believes she is carrying about her business as usual at home. She reflected that colleagues and friends comment on how well she seems to be faring.  [name] has a distant history of OCD. She visited the hospital numerous times and ultimately began seeing a psychiatrist.  She shared that she was prescribed medication and underwent therapy to help with the obsessions that she was experiencing. While she reports that she did not want to kill herself, she engaged in self-harm behaviors, including cutting her wrist in the very distant past. She has not seen a therapist for some time and reports that these symptoms have been well managed over the last 20 years. No recent attempts to self-harm.  In addition to getting support and medication management, she also leaned heavily into her faith during this time. She now takes trazodone  (note: pt was uncertain of medication but believed this to be the one) and reports that this helps her. She does not anticipate that the OCD symptoms will impact her transplant course in an undesirable way. [name] leans readily into social support and her religious beliefs.  She does live on a fixed income and worries about her finances. She appreciates help with financial navigation. Her hope is to resume a full and active life after transplant recovery, in which she can re-engage her church community, work, and social relationships.  Social & Familial Support: [name], [name], resides next door and will be her primary caregiver.  He will help with transportation to her appointments and performs much of her grocery  shopping.  Her son will move in with her for some time, taking two weeks of his vacation. She has one sister who also resides nearby. Her sister has three children. [place], including a female friend with whom she goes out to dinner. Her husband died about nine years ago from complications related to Alzheimer's and Parkinson's.  She finally had to place him in a facility, which was a very difficult transition for her. Her mother is now deceased. Her father also had Alzheimer's.  She has a supportive church community.  Her sister and a good friend also will come to stay with her in a rotating fashion when her son is working. She is working to finalize the details of this care plan.  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 She thoroughly enjoys her work and appreciates having the support from her Production designer, theatre/television/film.  She plans to take the time off during transplant and hopes to return when able. Anzal enjoys the part time structure.  Substance usage history: Shalane [place] Throckmorton reported that she does not drink alcohol or use tobacco or illicit drugs.  Previous Mental Health Treatment: positive for psychiatric involvement for management of OCD; pt reports that this occurred decades ago; she does endorse engaging in self-harm during this time, before symptoms were well managed, resulting in ED visits; reports that she has not seen a behavioral health provider in over 20 years and believes she is coping well.  Psychiatric  Medications: Trazodone  50 mg by mouth nightly- helps with OCD she reports   We discussed the following care plan in relation to psychosocial well-being in the context of the upcoming intervention:   Emotional Adaptation and Coping Coral engages in a pleasant interpersonal fashion. She is tolerating  treatment well and is grateful that she maintains a good functional status that permits her to live independently, work in a part time capacity and perform work around the home. Her husband died nine years ago from complications related to Parkinson's and Alzheimer's, she shared. She experienced a difficult caregiving journey before having to place him into a facility. Shterna benefits from strong social support, including her son (residing next door), her sister, and her neighbors. Remaining independent is very important for her. She reported having some brain fog and difficulty with anomia. Recent MOCA score was below normal limits; however she believes that this was not an accurate reading.  She may have a tendency to underreport concerns out of  a drive to remain independent. She enjoys her structure  and daily life. Marisabel believes she is coping well. She is hopeful that she will achieve good outcomes but also has worked to accept what comes her way in life. She has a history of OCD that was managed via psychiatric involvement, therapy and medication management. She is not seeking treatment for this currently, reporting that she is coping well and symptoms are controlled. She does not believe OCD symptoms will impact transplant experience negatively. She does harbor  some financial concerns. POP/CPSP Recs: Writer explained the role and availability of Psychosocial Oncology team for patient and family members. Additionally, we discussed the important role of emotional adaptation and coping in care engagement and outcomes. Pt relies readily on religious coping. Although she has strong support from her church community and minister, she may benefit from pastoral care engagement. OCD diagnostic history and care- reports this is well managed; difficulty remembering medication history; no longer seeing psychiatry Cognition- reports difficulty with anomia; continue to monitor and to reinforce care instructions Some  financial distress- involve financial navigation and seek resources  Health Behaviors  A. Nutrition- reports maintaining a good appetite.  B. Exercise- Geriatric clinic recommending PT to help with gait and balance; pt believes she is faring well in this area  C. Sleep Quality- falls and stays asleep well  D. Substance usage- none reported  Targeted Recommendations Continue with focused attention to conditioning and balance, including PT if needed; resides independently  Cognitive function Jalesha described experiencing difficulty with the cognitive assessment administered in the geriatric clinic, believing it was not an accurate reading.  She does report some brain fog as evidenced by difficulty completing sentences. Her son helps her balance her checkbook. Vonya also goes to the credit union to seek support with banking.  Husband and father both has Alzheimer's. Her independence is very important to her. Continue to monitor and support. MOCA assessment prn Continue to monitor cognitive functioning and support with reinforced information, also involving family as needed.    Relevant mood stabilizing Medication: Trazodone  nightly     Developmental/Phase of Life Considerations Residing independently with strong desire to maintain independence. Working part time. Her son resides next door. Was care provider for her husband for nine years when diagnosed with Parkinson's and Alzheimer's.   Communication and Information Literacy Transplant Orientation Class: Marianne Noble Dixons will attend the transplant orientation class within our institution and will attend a consent appointment. This orientation class will be facilitated by our  transplant coordinator and includes detailed information exchange aimed to: 1) Decrease anxiety about transplant and hospital admission, 2) augment understanding of the transplant process related to chemotherapy, blood counts, expected side effects, nursing considerations,  transplant language, and 3) increase integration of transplant requirements both during hospitalization and after discharge including medications, appointments, expectations, common side effects. Other topics covered include: Team, Auto/Allo transplant process, Admission process, Patient/ family expectations, Unit Policies, Medications commonly used, Nutrition, Expected side effects, Post-transplant follow-up, Internet Resources. Seems to possess a good comprehension of the transplant process  Social Support Primary: [name] Dixons, son, resides next door and will take time from work to provide care Secondary: Sister Others:Supportive friend. Specific Caregiver Needs: Jailene continues to determine her concrete care provision plan, including rotation.  Coordination of care with other internal/external disciplines: Financial navigation to support financial wellness and reduce stress 2. PT, recommended by geriatric clinic 3. Pastoral Care throughout admission   Psychosocial Oncology Team Plan A. Pt was encouraged to continue to monitor mood and general adjustment. We discussed the important role of emotional well-being and adaptation within the context of care engagement and adaptation throughout the process. B. OCD hx- believes this is well managed and will not be pertinent to current process. C. Pt is amendable to inpatient visits throughout the process, as indicated by the care team, the writer or self-identified by the pt. D. Consider pastoral care involvement throughout admission- strong religious coping E. Monitor cognitive functioning over time and support appropriately F. Financial navigation- lives on fixed income and reports limited flexibility in budget, creating some distress G.Support mobility and functional status. Pt continues to determine specifics of care plan amongst social supports.  We are happy to be a part of pt's care team with attention to psychosocial assessment and intervention  in the outpatient and/or inpatient ambulatory setting. Writer provided contact information on how to reach us  for acute needs.   Logistical Needs: Financial resources to offset additional expenses      Due to the federally mandated 21st Century Cures Act Info  [employer], all progress notes automatically appear in electronic medical records. Please be aware that anyone with access to your medical record, MyChart app, or [facility] account, including but not limited to your medical providers and family/friends if applicable, may be able to view potentially sensitive information about your physical and mental health.   Izetta CHARLENA Ferns, PhD, PhD, [facility] Associate Professor Psychosocial Oncology and Cancer Patient Support Programs Electronically signed by: Katharine E Duckworth, PhD 02/06/2024 11:25 AM

## 2024-02-06 NOTE — Patient Instructions (Signed)
 [facility] [place] - A DEPT OF [facility]  Discharge Instructions: Thank you for choosing [facility] to provide your oncology and hematology care.  If you have a lab appointment with the Cancer Center - please note that after April 8th, 2024, all labs will be drawn in the cancer center.  You do not have to check in or register with the main entrance as you have in the past but will complete your check-in in the cancer center.  Wear comfortable clothing and clothing appropriate for easy access to any Portacath or PICC line.   We strive to give you quality time with your provider. You may need to reschedule your appointment if you arrive late (15 or more minutes).  Arriving late affects you and other patients whose appointments are after yours.  Also, if you miss three or more appointments without notifying the office, you may be dismissed from the clinic at the provider's discretion.      For prescription refill requests, have your pharmacy contact our office and allow 72 hours for refills to be completed.    Today you received the following chemotherapy and/or immunotherapy agents D1 Vidaza   To help prevent nausea and vomiting after your treatment, we encourage you to take your nausea medication as directed.  Azacitidine Injection What is this medication? AZACITIDINE (ay za SITE i deen) treats blood and bone marrow cancers. It works by slowing down the growth of cancer cells. This medicine may be used for other purposes; ask your health care provider or pharmacist if you have questions. COMMON BRAND NAME(S): Vidaza What should I tell my care team before I take this medication? They need to know if you have any of these conditions: Kidney disease Liver disease Low blood cell levels, such as low white cells, platelets, or red blood cells Low levels of albumin in the blood Low levels of bicarbonate in the blood An unusual or allergic reaction to  azacitidine, mannitol, other medications, foods, dyes, or preservatives If you or your partner are pregnant or trying to get pregnant Breast-feeding How should I use this medication? This medication is injected into a vein or under the skin. It is given by your care team in a hospital or clinic setting. Talk to your care team about the use of this medication in children. While it may be prescribed for children as young as 1 month for selected conditions, precautions do apply. Overdosage: If you think you have taken too much of this medicine contact a poison control center or emergency room at once. NOTE: This medicine is only for you. Do not share this medicine with others. What if I miss a dose? Keep appointments for follow-up doses. It is important not to miss your dose. Call your care team if you are unable to keep an appointment. What may interact with this medication? Interactions are not expected. This list may not describe all possible interactions. Give your health care provider a list of all the medicines, herbs, non-prescription drugs, or dietary supplements you use. Also tell them if you smoke, drink alcohol, or use illegal drugs. Some items may interact with your medicine. What should I watch for while using this medication? Your condition will be monitored carefully while you are receiving this medication. This medication may make you feel generally unwell. This is not uncommon as chemotherapy can affect healthy cells as well as cancer cells. Report any side effects. Continue your course of treatment  even though you feel ill unless your care team tells you to stop. You may need blood work done while you are taking this medication. Other product types may be available that contain the medication azacitidine. The injection and oral products should not be used in place of one another. Talk to your care team if you have questions. This medication can cause serious side effects. To reduce  the risk, your care team may give you other medications to take before receiving this one. Be sure to follow the directions from your care team. This medication may increase your risk of getting an infection. Call your care team for advice if you get a fever, chills, sore throat, or other symptoms of a cold or flu. Do not treat yourself. Try to avoid being around people who are sick. Avoid taking medications that contain aspirin, acetaminophen, ibuprofen, naproxen, or ketoprofen unless instructed by your care team. These medications may hide a fever. Be careful brushing or flossing your teeth or using a toothpick because you may get an infection or bleed more easily. If you have any dental work done, tell your dentist you are receiving this medication. Talk to your care team if you or your partner may be pregnant. Serious birth defects can occur if you take this medication during pregnancy and for 6 months after the last dose. You will need a negative pregnancy test before starting this medication. Contraception is recommended while taking this medication and for 6 months after the last dose. Your care team can help you find the option that works for you. If your partner can get pregnant, use a condom during sex while taking this medication and for 3 months after the last dose. Do not breastfeed while taking this medication and for 1 week after the last dose. This medication may cause infertility. Talk to your care team if you are concerned about your fertility. What side effects may I notice from receiving this medication? Side effects that you should report to your care team as soon as possible: Allergic reactions--skin rash, itching, hives, swelling of the face, lips, tongue, or throat Infection--fever, chills, cough, sore throat, wounds that don't heal, pain or trouble when passing urine, general feeling of discomfort or being unwell Kidney injury--decrease in the amount of urine, swelling of the  ankles, hands, or feet Liver injury--right upper belly pain, loss of appetite, nausea, light-colored stool, dark yellow or brown urine, yellowing skin or eyes, unusual weakness or fatigue Low red blood cell level--unusual weakness or fatigue, dizziness, headache, trouble breathing Tumor lysis syndrome (TLS)--nausea, vomiting, diarrhea, decrease in the amount of urine, dark urine, unusual weakness or fatigue, confusion, muscle pain or cramps, fast or irregular heartbeat, joint pain Unusual bruising or bleeding Side effects that usually do not require medical attention (report to your care team if they continue or are bothersome): Constipation Diarrhea Nausea Pain, redness, or irritation at injection site Vomiting This list may not describe all possible side effects. Call your doctor for medical advice about side effects. You may report side effects to FDA at 1-800-FDA-1088. Where should I keep my medication? This medication is given in a hospital or clinic. It will not be stored at home. NOTE: This sheet is a summary. It may not cover all possible information. If you have questions about this medicine, talk to your doctor, pharmacist, or health care provider.  2024 Elsevier/Gold Standard (2023-03-07 00:00:00) BELOW ARE SYMPTOMS THAT SHOULD BE REPORTED IMMEDIATELY: *FEVER GREATER THAN 100.4 F (38 C) OR  HIGHER *CHILLS OR SWEATING *NAUSEA AND VOMITING THAT IS NOT CONTROLLED WITH YOUR NAUSEA MEDICATION *UNUSUAL SHORTNESS OF BREATH *UNUSUAL BRUISING OR BLEEDING *URINARY PROBLEMS (pain or burning when urinating, or frequent urination) *BOWEL PROBLEMS (unusual diarrhea, constipation, pain near the anus) TENDERNESS IN MOUTH AND THROAT WITH OR WITHOUT PRESENCE OF ULCERS (sore throat, sores in mouth, or a toothache) UNUSUAL RASH, SWELLING OR PAIN  UNUSUAL VAGINAL DISCHARGE OR ITCHING   Items with * indicate a potential emergency and should be followed up as soon as possible or go to the Emergency  Department if any problems should occur.  Please show the CHEMOTHERAPY ALERT CARD or IMMUNOTHERAPY ALERT CARD at check-in to the Emergency Department and triage nurse.  Should you have questions after your visit or need to cancel or reschedule your appointment, please contact [facility] [place] - A DEPT OF Eligh[facility] [phone]  and follow the prompts.  Office hours are 8:00 a.m. to 4:30 p.m. Monday - Friday. Please note that voicemails left after 4:00 p.m. may not be returned until the following business day.  We are closed weekends and major holidays. You have access to a nurse at all times for urgent questions. Please call the main number to the clinic [phone] and follow the prompts.  For any non-urgent questions, you may also contact your provider using MyChart. We now offer e-Visits for anyone 70 and older to request care online for non-urgent symptoms. For details visit [email or web address].   Also download the MyChart app! Go to the app store, search "MyChart", open the app, select [facility], and log in with your MyChart username and password.

## 2024-02-06 NOTE — Progress Notes (Signed)
 Patient has been examined by Dr. Rogers. Vital signs and labs have been reviewed by MD - Creatinine, LFTs, hemoglobin, and platelets are within treatment parameters per M.D. - pt may proceed with treatment. Okay to proceed w/tx w/o ANC per MD. Primary RN and pharmacy notified.

## 2024-02-06 NOTE — Progress Notes (Signed)
 [facility] [address], [name]    Clinic Day:  02/06/2024  Referring physician: Sheryle Carwin, MD  Patient Care Team: Sheryle Carwin, MD as PCP - General (Internal Medicine) [name], MD as Attending Physician (Gastroenterology) [name], MD as Consulting Physician (Pulmonary Disease) [name], MD as Medical Oncologist (Hematology)   ASSESSMENT & PLAN:   Assessment: 1.  Macrocytic anemia and thrombocytopenia: - Patient seen at the request of Dr. Sheryle for abnormal CBC. - 04/17/2022: WBC 15.8, Hb-9.7, PLT-128 - 03/24/2022: WBC-8.1 (N-43%, L-17%, M-19%, B-2%), Hb-9.6, MCV-99, PLT-115 - Smear review: 13% metamyelocytes, 2% myelocytes, 2% blasts,  - 10/19/2021: WBC-5.3 (N-61, L-21, M-10%), Hb-11, MCV-96, PLT-97 - BMBX (05/25/2022): Hypercellular bone marrow with myeloid hyperplasia with dysgranulopoiesis, erythroid hypoplasia and megakaryocytic hyperplasia with dyspoiesis.  Chromosome analysis was 66, XX.  Bone marrow blasts less than 5%.  Peripheral blood blasts less than 2%. - Mayo molecular model risk stratification: Intermediate 2 risk with at least 2 points (decreased hemoglobin less than 10, circulating immature cells).  Intermediate 2 risk with median OS 31 months. - Serum EPO level 41. - NGS: Positive for CBL, MPL, SRSF2, TET 2, RAD21 - PET scan (07/08/2022): Splenomegaly (volume 510 mm) with normal metabolic activity.  Uniform increase in marrow metabolic activity related to patient's anemia.  No lymphadenopathy. - BMBX (07/16/2022): Hypercellular bone marrow (95-100%) with myeloid hyperplasia, atypical monocytes, increased blasts (16% overall).  Atypical monocytosis present 23% of total events, expressing HLA-DR, CD38, CD4 dim, CD11C, CD13, CD14, CD36, CD64 with aberrant coexpression of CD56 and CD7. - Cycle 1 of azacitidine  on 08/02/2022, cycle 2 on 08/30/2022.  Azacitidine  decreased to 5 days starting cycle 5 on 11/22/2022 - BMBX  (09/21/2022): 3% blasts, hypercellular marrow with markedly increased dyspoietic megakaryocytes, increased fibrosis - BMBX (11/16/2022): Hypercellular marrow (70-80%) with marked GIST [name] (, myelofibrosis and 3% blasts on a very limited sample. - BMBX (05/06/2023): Hypercellular marrow with 85% cellularity with fibrosis and 4% blasts approximately. - BMBX (07/08/2023): Hypercellular marrow (95%) with increased myelopoiesis, dysmegakrypoiesis, increased fibrosis and 5% blasts.  Cytogenetics 60, XX. - NGS (07/08/2023): MPL, SRSF2, RUNX1, TET2 - BMBX (09/13/2023): Hypercellular marrow (100%) with increased myelopoiesis and this megakaryocytes.  Moderate myelofibrosis, grade 2 of 3.  No increase in blasts (1%).   2.  Social/family history: - She lives at home by herself.  Son lives next door. - Does part-time work at [employer].  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  Non-smoker. - Maternal aunt had tumor in the breast, patient not certain if it is cancer.  2 maternal first cousins had lymphoma.  Mother had stomach cancer.    Plan: 1.  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 - She did not have to receive any blood transfusion.  Last blood transfusion was on 12/19/2023.  She denies any infection in the last 4 weeks.  She is taking Mobic  daily for right hand pain. - She has met with Dr. Kayla on 01/09/2024 for transplant evaluation at [facility]. - Labs today: Normal LFTs and creatinine.  LDH improved to 456 from 542.  White count is 2.2 with ANC of 1.3.  Hemoglobin is 8.8. - She may proceed with her next cycle of azacitidine  cycle 19 at 75 mg/m x 7 days. - She will see Dr. [name] in the second week of August.  RTC 4 weeks for  follow-up.   2.  Folate deficiency: - Previously had folic acid  deficiency.  Continue folic acid  daily.   3.  Weight loss: - She is drinking boost/Ensure 1 to 2  cans/day.  Weight has increased by 2 pounds.   4.  Constipation: - Continue MiraLAX as needed.  This is well-controlled.    No orders of the defined types were placed in this encounter.     LILLETTE Verneta SAUNDERS Teague,acting as a Neurosurgeon for [name], MD.,have documented all relevant documentation on the behalf of [name], MD,as directed by  [name], MD while in the presence of [name], MD.  I, [name] MD, have reviewed the above documentation for accuracy and completeness, and I agree with the above.     [name], MD   7/21/20253:28 PM  CHIEF COMPLAINT:   Diagnosis: CMML-2    Cancer Staging  No matching staging information was found for the patient.    Prior Therapy: none  Current Therapy:  Azacitidine  70 mg/m x 7 days every 28 days    HISTORY OF PRESENT ILLNESS:   Oncology History  CMML (chronic myelomonocytic leukemia) (HCC)  06/03/2022 Initial Diagnosis   CMML (chronic myelomonocytic leukemia) (HCC)   08/02/2022 -  Chemotherapy   Patient is on Treatment Plan : MYELODYSPLASIA  Azacitidine  IVPB D1-7 q28d        INTERVAL HISTORY:   [name] is a 73 y.o. female presenting to clinic today for follow up of CMML-2. She was last seen by me on 01/10/2024.  Today, she states that she is doing well overall. Her appetite level is at 100%. Her energy level is at 100%.   PAST MEDICAL HISTORY:   Past Medical History: Past Medical History:  Diagnosis Date   Anxiety    Asthma    had 1 episode 1 year ago-no more problem   CHF (congestive heart failure) (HCC)    swelling of feet & ankles   CMML (chronic myelomonocytic leukemia) (HCC) 06/03/2022   Cough    Depression    OCD   GERD (gastroesophageal reflux disease)    severe   OCD (obsessive compulsive disorder)    Port-A-Cath in place 07/26/2022   Shortness of breath    with exertion   Yeast infection 04/09/2014    Surgical History: Past Surgical  History:  Procedure Laterality Date   CARPAL TUNNEL RELEASE  03/22/2012   Procedure: CARPAL TUNNEL RELEASE;  Surgeon: Arley SAUNDERS Curia, MD;  Location: [facility];  Service: Orthopedics;  Laterality: Right;  right carpal tunnel release   COLONOSCOPY  06/19/2012   Dr. Rourk:normal rectum and colon    ESOPHAGOGASTRODUODENOSCOPY N/A 12/26/2015   Dr. Shaaron: normal    IR IMAGING GUIDED PORT INSERTION  07/30/2022   TONSILLECTOMY     TUBAL LIGATION      Social History: Social History   Socioeconomic History   Marital status: Widowed    Spouse name: Not on file   Number of children: 1   Years of education: Not on file   Highest education level: Not on file  Occupational History   Occupation: retired  Tobacco Use   Smoking status: Never    Passive exposure: Yes   Smokeless tobacco: Never  Vaping Use   Vaping status: Never Used  Substance and Sexual Activity   Alcohol use: No   Drug use: No   Sexual activity: Yes    Birth control/protection: Surgical    Comment: tubal  Other Topics Concern  Not on file  Social History Narrative   Not on file   Social Drivers of Health   Financial Resource Strain: Not on file  Food Insecurity: Not on file  Transportation Needs: Not on file  Physical Activity: Not on file  Stress: Not on file  Social Connections: Not on file  Intimate Partner Violence: Not on file    Family History: Family History  Problem Relation Age of Onset   Asthma Mother    Macular degeneration Mother    Hypertension Mother    Stomach cancer Mother 73   Alzheimer's disease Father    Other Sister        blood clots   Diabetes Maternal Grandfather    Diabetes Paternal Grandfather    Other Son        enlarged spleen   Lymphoma Cousin        x2, both dx in their 85s   Colon cancer Neg Hx     Current Medications:  Current Outpatient Medications:    albuterol  (VENTOLIN  HFA) 108 (90 Base) MCG/ACT inhaler, INHALE TWO PUFFS EVERY 4 HOURS AS NEEDED FOR  WHEEZING OR FOR SHORTNESS OF BREATH, Disp: 18 g, Rfl: 1   albuterol  (VENTOLIN  HFA) 108 (90 Base) MCG/ACT inhaler, INHALE 2 PUFFS BY MOUTH EVERY 4 HOURS AS NEEDED FOR SHORTNESS OF BREATH OR WHEEZING., Disp: 18 g, Rfl: 1   amoxicillin -clavulanate (AUGMENTIN ) 875-125 MG tablet, Take 1 tablet by mouth 2 (two) times daily., Disp: 14 tablet, Rfl: 0   azaCITIDine  5 mg/2 mLs in lactated ringers  infusion, Inject into the vein daily. Days 1-7 every 28 days, Disp: , Rfl:    azelastine  (ASTELIN ) 0.1 % nasal spray, SPRAY 1 TO 2 SPRAYS PER NOSTRIL TWICE DAILY., Disp: 30 mL, Rfl: 3   benzonatate  (TESSALON ) 200 MG capsule, Take 200 mg by mouth 3 (three) times daily as needed., Disp: , Rfl:    BREZTRI  AEROSPHERE 160-9-4.8 MCG/ACT AERO, Inhale 2 puffs into the lungs 2 (two) times daily., Disp: 10.7 g, Rfl: 5   cetirizine  (ZYRTEC ) 5 MG tablet, Take 1 tablet (5 mg total) by mouth daily., Disp: 30 tablet, Rfl: 0   ciclopirox  (PENLAC ) 8 % solution, APPLY TOPICALLY AT BEDTIME. APPLY OVER NAIL FOLD AND SURROUNDING SKIN. APPLY DAILY OVER PREVIOUS COAT. AFTER SEVEN DAYS, MAY REMOVE WITH ALCOHOL AND CONTINUE CYCLE., Disp: 6.6 mL, Rfl: 2   EPINEPHrine  0.3 mg/0.3 mL IJ SOAJ injection, Inject 0.3 mg into the muscle as needed for anaphylaxis., Disp: 2 each, Rfl: 1   famotidine  (PEPCID ) 20 MG tablet, Take 1 tablet (20 mg total) by mouth 2 (two) times daily., Disp: 60 tablet, Rfl: 5   FLUoxetine (PROZAC) 20 MG capsule, Take 1 capsule by mouth daily., Disp: , Rfl:    fluticasone  (FLONASE ) 50 MCG/ACT nasal spray, Place 2 sprays into both nostrils 2 (two) times daily., Disp: 16 g, Rfl: 5   folic acid  (FOLVITE ) 1 MG tablet, TAKE ONE TABLET BY MOUTH ONCE DAILY, Disp: 30 tablet, Rfl: 5   gabapentin (NEURONTIN) 300 MG capsule, , Disp: , Rfl:    HYDROcodone  bit-homatropine (HYCODAN) 5-1.5 MG/5ML syrup, , Disp: , Rfl:    ipratropium (ATROVENT ) 0.06 % nasal spray, USE TWO SPRAYS IN EACH NOSTRIL THREE TIMES DAILY AS NEEDED, Disp: 15 mL, Rfl:  5   lactulose  (CHRONULAC ) 10 GM/15ML solution, Take 15 mLs (10 g total) by mouth every 3 (three) hours as needed for mild constipation., Disp: 450 mL, Rfl: 2   lidocaine -prilocaine  (EMLA ) cream, Apply  a quarter sized amount to port a cath site and cover with plastic wrap one hour prior to infusion appointments, Disp: 30 g, Rfl: 3   megestrol  (MEGACE ) 400 MG/10ML suspension, Take 10 mLs (400 mg total) by mouth 2 (two) times daily., Disp: 480 mL, Rfl: 2   meloxicam  (MOBIC ) 7.5 MG tablet, Take 7.5 mg by mouth daily., Disp: , Rfl:    montelukast  (SINGULAIR ) 10 MG tablet, Take 1 tablet (10 mg total) by mouth at bedtime., Disp: 30 tablet, Rfl: 5   NON FORMULARY, [facility]  Antifungal (nail)-#1, Disp: , Rfl:    pantoprazole  (PROTONIX ) 40 MG tablet, TAKE ONE TABLET BY MOUTH ONCE DAILY, Disp: 30 tablet, Rfl: 11   prochlorperazine  (COMPAZINE ) 10 MG tablet, Take 1 tablet (10 mg total) by mouth every 6 (six) hours as needed for nausea or vomiting., Disp: 30 tablet, Rfl: 0   Respiratory Therapy Supplies (FLUTTER) DEVI, Use as directed, Disp: 1 each, Rfl: 3   SF 5000 PLUS 1.1 % CREA dental cream, , Disp: , Rfl:    Spacer/Aero-Holding Chambers (AEROCHAMBER PLUS WITH MASK) inhaler, 1 each by Other route See admin instructions. Use with inhaler as instructed., Disp: 1 each, Rfl: 1   Tezepelumab -ekko (TEZSPIRE ) 210 MG/1. SOAJ, Inject 210 mg into the skin every 28 (twenty-eight) days., Disp: 1.91 mL, Rfl: 11   tiZANidine  (ZANAFLEX ) 2 MG tablet, Take 1 tablet (2 mg total) by mouth 2 (two) times daily as needed for muscle spasms., Disp: 15 tablet, Rfl: 0   traZODone  (DESYREL ) 50 MG tablet, TAKE ONE TABLET BY MOUTH AT BEDTIME, Disp: 30 tablet, Rfl: 3  Current Facility-Administered Medications:    tezepelumab -ekko (TEZSPIRE ) 210 MG/1. syringe 210 mg, 210 mg, Subcutaneous, Q28 days, Jeneal Danita Macintosh, MD, 210 mg at 01/25/24 9140   Allergies: Allergies  Allergen Reactions    Levonorgestrel-Ethinyl Estrad Cough   Sulfa Antibiotics Other (See Comments)    Unknown- pt unsure of reaction; believes it may be nausea   Sulfamethoxazole-Trimethoprim Other (See Comments)   Cephalosporins Other (See Comments)    Other Reaction: Toxicity    REVIEW OF SYSTEMS:   Review of Systems  Constitutional:  Negative for chills, fatigue and fever.  HENT:   Negative for lump/mass, mouth sores, nosebleeds, sore throat and trouble swallowing.   Eyes:  Negative for eye problems.  Respiratory:  Positive for cough and shortness of breath.   Cardiovascular:  Negative for chest pain, leg swelling and palpitations.  Gastrointestinal:  Positive for constipation. Negative for abdominal pain, diarrhea, nausea and vomiting.  Genitourinary:  Negative for bladder incontinence, difficulty urinating, dysuria, frequency, hematuria and nocturia.   Musculoskeletal:  Negative for arthralgias, back pain, flank pain, myalgias and neck pain.  Skin:  Negative for itching and rash.  Neurological:  Negative for dizziness, headaches and numbness.  Hematological:  Does not bruise/bleed easily.  Psychiatric/Behavioral:  Negative for depression, sleep disturbance and suicidal ideas. The patient is not nervous/anxious.   All other systems reviewed and are negative.    VITALS:   There were no vitals taken for this visit.  Wt Readings from Last 3 Encounters:  02/06/24 126 lb 14.4 oz (57.6 kg)  01/16/24 126 lb 12.8 oz (57.5 kg)  01/10/24 124 lb 9 oz (56.5 kg)    There is no height or weight on file to calculate BMI.  Performance status (ECOG): 1 - Symptomatic but completely ambulatory  PHYSICAL EXAM:   Physical Exam Vitals and nursing note reviewed. Exam conducted with a chaperone present.  Constitutional:      Appearance: Normal appearance.  Cardiovascular:     Rate and Rhythm: Normal rate and regular rhythm.     Pulses: Normal pulses.     Heart sounds: Normal heart sounds.  Pulmonary:      Effort: Pulmonary effort is normal.     Breath sounds: Normal breath sounds.  Abdominal:     Palpations: Abdomen is soft. There is no hepatomegaly, splenomegaly or mass.     Tenderness: There is no abdominal tenderness.  Musculoskeletal:     Right lower leg: No edema.     Left lower leg: No edema.  Lymphadenopathy:     Cervical: No cervical adenopathy.     Right cervical: No superficial, deep or posterior cervical adenopathy.    Left cervical: No superficial, deep or posterior cervical adenopathy.     Upper Body:     Right upper body: No supraclavicular or axillary adenopathy.     Left upper body: No supraclavicular or axillary adenopathy.  Neurological:     General: No focal deficit present.     Mental Status: She is alert and oriented to person, place, and time.  Psychiatric:        Mood and Affect: Mood normal.        Behavior: Behavior normal.     LABS:   CBC     Component Value Date/Time   WBC 2.2 (L) 02/06/2024 1237   RBC 2.75 (L) 02/06/2024 1237   HGB 8.8 (L) 02/06/2024 1237   HGB 13.1 07/23/2020 1112   HCT 27.8 (L) 02/06/2024 1237   HCT 40.7 07/23/2020 1112   PLT 147 (L) 02/06/2024 1237   MCV 101.1 (H) 02/06/2024 1237   MCV 95 07/23/2020 1112   MCH 32.0 02/06/2024 1237   MCHC 31.7 02/06/2024 1237   RDW 20.6 (H) 02/06/2024 1237   RDW 14.2 07/23/2020 1112   LYMPHSABS 0.7 02/06/2024 1237   LYMPHSABS 0.7 07/23/2020 1112   MONOABS 0.1 02/06/2024 1237   EOSABS 0.0 02/06/2024 1237   EOSABS 0.1 07/23/2020 1112   BASOSABS 0.0 02/06/2024 1237   BASOSABS 0.0 07/23/2020 1112    CMP      Component Value Date/Time   NA 136 02/06/2024 1237   K 3.7 02/06/2024 1237   CL 103 02/06/2024 1237   CO2 26 02/06/2024 1237   GLUCOSE 116 (H) 02/06/2024 1237   BUN 10 02/06/2024 1237   CREATININE 0.83 02/06/2024 1237   CALCIUM 8.9 02/06/2024 1237   PROT 6.7 02/06/2024 1237   ALBUMIN 3.8 02/06/2024 1237   AST 34 02/06/2024 1237   ALT 20 02/06/2024 1237   ALKPHOS 49  02/06/2024 1237   BILITOT 1.1 02/06/2024 1237   GFRNONAA >60 02/06/2024 1237   GFRAA >60 02/24/2019 1415     No results found for: CEA1, CEA / No results found for: CEA1, CEA No results found for: PSA1 No results found for: CAN199 No results found for: CAN125  Lab Results  Component Value Date   TOTALPROTELP 6.3 04/26/2022   ALBUMINELP 3.8 04/26/2022   A1GS 0.3 04/26/2022   A2GS 0.5 04/26/2022   BETS 0.8 04/26/2022   GAMS 0.9 04/26/2022   MSPIKE Not Observed 04/26/2022   SPEI Comment 04/26/2022   Lab Results  Component Value Date   TIBC 343 04/26/2022   FERRITIN 114 04/26/2022   IRONPCTSAT 25 04/26/2022   Lab Results  Component Value Date   LDH 456 (H) 02/06/2024   LDH 542 (H) 01/10/2024  LDH 606 (H) 12/13/2023     STUDIES:   DG Hand Complete Right Result Date: 02/03/2024 CLINICAL DATA:  right hand pain x 1 week. EXAM: RIGHT HAND - COMPLETE 3+ VIEW COMPARISON:  None Available. FINDINGS: There is diffuse osteopenia of the visualized osseous structures. No acute fracture or dislocation. No aggressive osseous lesion. Note is made of negative ulnar variance. Mild diffuse degenerative changes of imaged joints. No periarticular osteopenia or bony erosions. No periarticular soft tissue calcifications. No radiopaque foreign bodies. Soft tissues are within normal limits. IMPRESSION: No acute osseous abnormality of the right hand. Electronically Signed   By: Ree Molt M.D.   On: 02/03/2024 08:58

## 2024-02-07 ENCOUNTER — Inpatient Hospital Stay

## 2024-02-07 VITALS — BP 100/60 | HR 71 | Temp 97.9°F | Resp 18

## 2024-02-07 DIAGNOSIS — D696 Thrombocytopenia, unspecified: Secondary | ICD-10-CM | POA: Diagnosis not present

## 2024-02-07 DIAGNOSIS — Z95828 Presence of other vascular implants and grafts: Secondary | ICD-10-CM

## 2024-02-07 DIAGNOSIS — C9310 Chronic myelomonocytic leukemia not having achieved remission: Secondary | ICD-10-CM

## 2024-02-07 DIAGNOSIS — D539 Nutritional anemia, unspecified: Secondary | ICD-10-CM | POA: Diagnosis not present

## 2024-02-07 DIAGNOSIS — D7581 Myelofibrosis: Secondary | ICD-10-CM | POA: Diagnosis not present

## 2024-02-07 DIAGNOSIS — E538 Deficiency of other specified B group vitamins: Secondary | ICD-10-CM | POA: Diagnosis not present

## 2024-02-07 DIAGNOSIS — Z5111 Encounter for antineoplastic chemotherapy: Secondary | ICD-10-CM | POA: Diagnosis not present

## 2024-02-07 DIAGNOSIS — Z9481 Bone marrow transplant status: Secondary | ICD-10-CM | POA: Diagnosis not present

## 2024-02-07 DIAGNOSIS — Z79899 Other long term (current) drug therapy: Secondary | ICD-10-CM | POA: Diagnosis not present

## 2024-02-07 DIAGNOSIS — K5900 Constipation, unspecified: Secondary | ICD-10-CM | POA: Diagnosis not present

## 2024-02-07 DIAGNOSIS — R634 Abnormal weight loss: Secondary | ICD-10-CM | POA: Diagnosis not present

## 2024-02-07 MED ORDER — SODIUM CHLORIDE 0.9 % IV SOLN: Freq: Once | INTRAVENOUS | Status: AC

## 2024-02-07 MED ORDER — HEPARIN SOD (PORK) LOCK FLUSH 100 UNIT/ML IV SOLN
500.0000 [IU] | Freq: Once | INTRAVENOUS | Status: AC
Start: 2024-02-07 — End: 2024-02-07
  Administered 2024-02-07: 500 [IU] via INTRAVENOUS

## 2024-02-07 MED ORDER — SODIUM CHLORIDE 0.9 % IV SOLN
75.0000 mg/m2 | Freq: Once | INTRAVENOUS | Status: AC
  Administered 2024-02-07: 126 mg via INTRAVENOUS
  Filled 2024-02-07: qty 12.6

## 2024-02-07 MED ORDER — SODIUM CHLORIDE 0.9% FLUSH
10.0000 mL | Freq: Once | INTRAVENOUS | Status: AC
Start: 2024-02-07 — End: 2024-02-07
  Administered 2024-02-07: 10 mL via INTRAVENOUS

## 2024-02-07 NOTE — Patient Instructions (Signed)
 [facility] [place] - A DEPT OF [facility]  Discharge Instructions: Thank you for choosing [facility] to provide your oncology and hematology care.  If you have a lab appointment with the Cancer Center - please note that after April 8th, 2024, all labs will be drawn in the cancer center.  You do not have to check in or register with the main entrance as you have in the past but will complete your check-in in the cancer center.  Wear comfortable clothing and clothing appropriate for easy access to any Portacath or PICC line.   We strive to give you quality time with your provider. You may need to reschedule your appointment if you arrive late (15 or more minutes).  Arriving late affects you and other patients whose appointments are after yours.  Also, if you miss three or more appointments without notifying the office, you may be dismissed from the clinic at the provider's discretion.      For prescription refill requests, have your pharmacy contact our office and allow 72 hours for refills to be completed.    Today you received the following chemotherapy and/or immunotherapy agents D2 Vidaza    To help prevent nausea and vomiting after your treatment, we encourage you to take your nausea medication as directed.  Azacitidine  Injection What is this medication? AZACITIDINE  (ay za SITE i deen) treats blood and bone marrow cancers. It works by slowing down the growth of cancer cells. This medicine may be used for other purposes; ask your health care provider or pharmacist if you have questions. COMMON BRAND NAME(S): Vidaza  What should I tell my care team before I take this medication? They need to know if you have any of these conditions: Kidney disease Liver disease Low blood cell levels, such as low white cells, platelets, or red blood cells Low levels of albumin in the blood Low levels of bicarbonate in the blood An unusual or allergic reaction to  azacitidine , mannitol, other medications, foods, dyes, or preservatives If you or your partner are pregnant or trying to get pregnant Breast-feeding How should I use this medication? This medication is injected into a vein or under the skin. It is given by your care team in a hospital or clinic setting. Talk to your care team about the use of this medication in children. While it may be prescribed for children as young as 1 month for selected conditions, precautions do apply. Overdosage: If you think you have taken too much of this medicine contact a poison control center or emergency room at once. NOTE: This medicine is only for you. Do not share this medicine with others. What if I miss a dose? Keep appointments for follow-up doses. It is important not to miss your dose. Call your care team if you are unable to keep an appointment. What may interact with this medication? Interactions are not expected. This list may not describe all possible interactions. Give your health care provider a list of all the medicines, herbs, non-prescription drugs, or dietary supplements you use. Also tell them if you smoke, drink alcohol, or use illegal drugs. Some items may interact with your medicine. What should I watch for while using this medication? Your condition will be monitored carefully while you are receiving this medication. This medication may make you feel generally unwell. This is not uncommon as chemotherapy can affect healthy cells as well as cancer cells. Report any side effects. Continue your course of treatment  even though you feel ill unless your care team tells you to stop. You may need blood work done while you are taking this medication. Other product types may be available that contain the medication azacitidine . The injection and oral products should not be used in place of one another. Talk to your care team if you have questions. This medication can cause serious side effects. To reduce  the risk, your care team may give you other medications to take before receiving this one. Be sure to follow the directions from your care team. This medication may increase your risk of getting an infection. Call your care team for advice if you get a fever, chills, sore throat, or other symptoms of a cold or flu. Do not treat yourself. Try to avoid being around people who are sick. Avoid taking medications that contain aspirin, acetaminophen , ibuprofen, naproxen , or ketoprofen unless instructed by your care team. These medications may hide a fever. Be careful brushing or flossing your teeth or using a toothpick because you may get an infection or bleed more easily. If you have any dental work done, tell your dentist you are receiving this medication. Talk to your care team if you or your partner may be pregnant. Serious birth defects can occur if you take this medication during pregnancy and for 6 months after the last dose. You will need a negative pregnancy test before starting this medication. Contraception is recommended while taking this medication and for 6 months after the last dose. Your care team can help you find the option that works for you. If your partner can get pregnant, use a condom during sex while taking this medication and for 3 months after the last dose. Do not breastfeed while taking this medication and for 1 week after the last dose. This medication may cause infertility. Talk to your care team if you are concerned about your fertility. What side effects may I notice from receiving this medication? Side effects that you should report to your care team as soon as possible: Allergic reactions--skin rash, itching, hives, swelling of the face, lips, tongue, or throat Infection--fever, chills, cough, sore throat, wounds that don't heal, pain or trouble when passing urine, general feeling of discomfort or being unwell Kidney injury--decrease in the amount of urine, swelling of the  ankles, hands, or feet Liver injury--right upper belly pain, loss of appetite, nausea, light-colored stool, dark yellow or brown urine, yellowing skin or eyes, unusual weakness or fatigue Low red blood cell level--unusual weakness or fatigue, dizziness, headache, trouble breathing Tumor lysis syndrome (TLS)--nausea, vomiting, diarrhea, decrease in the amount of urine, dark urine, unusual weakness or fatigue, confusion, muscle pain or cramps, fast or irregular heartbeat, joint pain Unusual bruising or bleeding Side effects that usually do not require medical attention (report to your care team if they continue or are bothersome): Constipation Diarrhea Nausea Pain, redness, or irritation at injection site Vomiting This list may not describe all possible side effects. Call your doctor for medical advice about side effects. You may report side effects to FDA at 1-800-FDA-1088. Where should I keep my medication? This medication is given in a hospital or clinic. It will not be stored at home. NOTE: This sheet is a summary. It may not cover all possible information. If you have questions about this medicine, talk to your doctor, pharmacist, or health care provider.  2024 Elsevier/Gold Standard (2023-03-07 00:00:00)  BELOW ARE SYMPTOMS THAT SHOULD BE REPORTED IMMEDIATELY: *FEVER GREATER THAN 100.4 F (38 C)  OR HIGHER *CHILLS OR SWEATING *NAUSEA AND VOMITING THAT IS NOT CONTROLLED WITH YOUR NAUSEA MEDICATION *UNUSUAL SHORTNESS OF BREATH *UNUSUAL BRUISING OR BLEEDING *URINARY PROBLEMS (pain or burning when urinating, or frequent urination) *BOWEL PROBLEMS (unusual diarrhea, constipation, pain near the anus) TENDERNESS IN MOUTH AND THROAT WITH OR WITHOUT PRESENCE OF ULCERS (sore throat, sores in mouth, or a toothache) UNUSUAL RASH, SWELLING OR PAIN  UNUSUAL VAGINAL DISCHARGE OR ITCHING   Items with * indicate a potential emergency and should be followed up as soon as possible or go to the  Emergency Department if any problems should occur.  Please show the CHEMOTHERAPY ALERT CARD or IMMUNOTHERAPY ALERT CARD at check-in to the Emergency Department and triage nurse.  Should you have questions after your visit or need to cancel or reschedule your appointment, please contact [facility] [place] - A DEPT OF [facility] [phone]  and follow the prompts.  Office hours are 8:00 a.m. to 4:30 p.m. Monday - Friday. Please note that voicemails left after 4:00 p.m. may not be returned until the following business day.  We are closed weekends and major holidays. You have access to a nurse at all times for urgent questions. Please call the main number to the clinic [phone] and follow the prompts.  For any non-urgent questions, you may also contact your provider using MyChart. We now offer e-Visits for anyone 41 and older to request care online for non-urgent symptoms. For details visit [email or web address].   Also download the MyChart app! Go to the app store, search MyChart, open the app, select [facility], and log in with your MyChart username and password.

## 2024-02-07 NOTE — Progress Notes (Signed)
 Patient presents today for D2 Vidaza  infusion.  Patient is in satisfactory condition with no new complaints voiced.  Vital signs are stable.  Labs reviewed from 02/06/24 and all labs are within treatment parameters.  We will proceed with treatment per MD orders.    Treatment given today per MD orders. Tolerated infusion without adverse affects. Vital signs stable. No complaints at this time. Discharged from clinic ambulatory in stable condition. Alert and oriented x 3. F/U with [facility] as scheduled.

## 2024-02-08 ENCOUNTER — Inpatient Hospital Stay

## 2024-02-08 VITALS — BP 96/61 | HR 73 | Temp 97.7°F | Resp 18

## 2024-02-08 DIAGNOSIS — D696 Thrombocytopenia, unspecified: Secondary | ICD-10-CM | POA: Diagnosis not present

## 2024-02-08 DIAGNOSIS — M6281 Muscle weakness (generalized): Secondary | ICD-10-CM | POA: Diagnosis not present

## 2024-02-08 DIAGNOSIS — K5900 Constipation, unspecified: Secondary | ICD-10-CM | POA: Diagnosis not present

## 2024-02-08 DIAGNOSIS — E538 Deficiency of other specified B group vitamins: Secondary | ICD-10-CM | POA: Diagnosis not present

## 2024-02-08 DIAGNOSIS — R634 Abnormal weight loss: Secondary | ICD-10-CM | POA: Diagnosis not present

## 2024-02-08 DIAGNOSIS — Z9481 Bone marrow transplant status: Secondary | ICD-10-CM | POA: Diagnosis not present

## 2024-02-08 DIAGNOSIS — Z79899 Other long term (current) drug therapy: Secondary | ICD-10-CM | POA: Diagnosis not present

## 2024-02-08 DIAGNOSIS — Z7409 Other reduced mobility: Secondary | ICD-10-CM | POA: Diagnosis not present

## 2024-02-08 DIAGNOSIS — D7581 Myelofibrosis: Secondary | ICD-10-CM | POA: Diagnosis not present

## 2024-02-08 DIAGNOSIS — R293 Abnormal posture: Secondary | ICD-10-CM | POA: Diagnosis not present

## 2024-02-08 DIAGNOSIS — C9310 Chronic myelomonocytic leukemia not having achieved remission: Secondary | ICD-10-CM

## 2024-02-08 DIAGNOSIS — Z95828 Presence of other vascular implants and grafts: Secondary | ICD-10-CM

## 2024-02-08 DIAGNOSIS — Z5111 Encounter for antineoplastic chemotherapy: Secondary | ICD-10-CM | POA: Diagnosis not present

## 2024-02-08 DIAGNOSIS — D539 Nutritional anemia, unspecified: Secondary | ICD-10-CM | POA: Diagnosis not present

## 2024-02-08 DIAGNOSIS — M2560 Stiffness of unspecified joint, not elsewhere classified: Secondary | ICD-10-CM | POA: Diagnosis not present

## 2024-02-08 MED ORDER — PALONOSETRON HCL INJECTION 0.25 MG/5ML
0.2500 mg | Freq: Once | INTRAVENOUS | Status: AC
  Administered 2024-02-08: 0.25 mg via INTRAVENOUS
  Filled 2024-02-08: qty 5

## 2024-02-08 MED ORDER — SODIUM CHLORIDE 0.9 % IV SOLN
75.0000 mg/m2 | Freq: Once | INTRAVENOUS | Status: AC
  Administered 2024-02-08: 126 mg via INTRAVENOUS
  Filled 2024-02-08: qty 12.6

## 2024-02-08 MED ORDER — SODIUM CHLORIDE 0.9 % IV SOLN
Freq: Once | INTRAVENOUS | Status: AC
Start: 2024-02-08 — End: 2024-02-08

## 2024-02-08 MED ORDER — HEPARIN SOD (PORK) LOCK FLUSH 100 UNIT/ML IV SOLN
500.0000 [IU] | Freq: Once | INTRAVENOUS | Status: AC
Start: 2024-02-08 — End: 2024-02-08
  Administered 2024-02-08: 500 [IU] via INTRAVENOUS

## 2024-02-08 MED ORDER — SODIUM CHLORIDE 0.9% FLUSH
10.0000 mL | Freq: Once | INTRAVENOUS | Status: AC
  Administered 2024-02-08: 10 mL via INTRAVENOUS

## 2024-02-08 NOTE — Progress Notes (Signed)
 Patient presents today for D3 Vidaza  infusion.  Patient is in satisfactory condition with no new complaints voiced.  Vital signs are stable.  Labs from 02/06/24 reviewed.  We will proceed with treatment per MD orders.    Patient tolerated treatment well with no complaints voiced.  Patient left ambulatory in stable condition.  Vital signs stable at discharge.  Follow up as scheduled.

## 2024-02-08 NOTE — Patient Instructions (Signed)
 [facility] [place] - A DEPT OF [facility]  Discharge Instructions: Thank you for choosing [facility] to provide your oncology and hematology care.  If you have a lab appointment with the Cancer Center - please note that after April 8th, 2024, all labs will be drawn in the cancer center.  You do not have to check in or register with the main entrance as you have in the past but will complete your check-in in the cancer center.  Wear comfortable clothing and clothing appropriate for easy access to any Portacath or PICC line.   We strive to give you quality time with your provider. You may need to reschedule your appointment if you arrive late (15 or more minutes).  Arriving late affects you and other patients whose appointments are after yours.  Also, if you miss three or more appointments without notifying the office, you may be dismissed from the clinic at the provider's discretion.      For prescription refill requests, have your pharmacy contact our office and allow 72 hours for refills to be completed.    Today you received the following chemotherapy and/or immunotherapy agents Vidaza.  Azacitidine Injection What is this medication? AZACITIDINE (ay za SITE i deen) treats blood and bone marrow cancers. It works by slowing down the growth of cancer cells. This medicine may be used for other purposes; ask your health care provider or pharmacist if you have questions. COMMON BRAND NAME(S): Vidaza What should I tell my care team before I take this medication? They need to know if you have any of these conditions: Kidney disease Liver disease Low blood cell levels, such as low white cells, platelets, or red blood cells Low levels of albumin in the blood Low levels of bicarbonate in the blood An unusual or allergic reaction to azacitidine, mannitol, other medications, foods, dyes, or preservatives If you or your partner are pregnant or trying to get  pregnant Breast-feeding How should I use this medication? This medication is injected into a vein or under the skin. It is given by your care team in a hospital or clinic setting. Talk to your care team about the use of this medication in children. While it may be prescribed for children as young as 1 month for selected conditions, precautions do apply. Overdosage: If you think you have taken too much of this medicine contact a poison control center or emergency room at once. NOTE: This medicine is only for you. Do not share this medicine with others. What if I miss a dose? Keep appointments for follow-up doses. It is important not to miss your dose. Call your care team if you are unable to keep an appointment. What may interact with this medication? Interactions are not expected. This list may not describe all possible interactions. Give your health care provider a list of all the medicines, herbs, non-prescription drugs, or dietary supplements you use. Also tell them if you smoke, drink alcohol, or use illegal drugs. Some items may interact with your medicine. What should I watch for while using this medication? Your condition will be monitored carefully while you are receiving this medication. This medication may make you feel generally unwell. This is not uncommon as chemotherapy can affect healthy cells as well as cancer cells. Report any side effects. Continue your course of treatment even though you feel ill unless your care team tells you to stop. You may need blood work done while you are  taking this medication. Other product types may be available that contain the medication azacitidine. The injection and oral products should not be used in place of one another. Talk to your care team if you have questions. This medication can cause serious side effects. To reduce the risk, your care team may give you other medications to take before receiving this one. Be sure to follow the directions from  your care team. This medication may increase your risk of getting an infection. Call your care team for advice if you get a fever, chills, sore throat, or other symptoms of a cold or flu. Do not treat yourself. Try to avoid being around people who are sick. Avoid taking medications that contain aspirin, acetaminophen, ibuprofen, naproxen, or ketoprofen unless instructed by your care team. These medications may hide a fever. Be careful brushing or flossing your teeth or using a toothpick because you may get an infection or bleed more easily. If you have any dental work done, tell your dentist you are receiving this medication. Talk to your care team if you or your partner may be pregnant. Serious birth defects can occur if you take this medication during pregnancy and for 6 months after the last dose. You will need a negative pregnancy test before starting this medication. Contraception is recommended while taking this medication and for 6 months after the last dose. Your care team can help you find the option that works for you. If your partner can get pregnant, use a condom during sex while taking this medication and for 3 months after the last dose. Do not breastfeed while taking this medication and for 1 week after the last dose. This medication may cause infertility. Talk to your care team if you are concerned about your fertility. What side effects may I notice from receiving this medication? Side effects that you should report to your care team as soon as possible: Allergic reactions--skin rash, itching, hives, swelling of the face, lips, tongue, or throat Infection--fever, chills, cough, sore throat, wounds that don't heal, pain or trouble when passing urine, general feeling of discomfort or being unwell Kidney injury--decrease in the amount of urine, swelling of the ankles, hands, or feet Liver injury--right upper belly pain, loss of appetite, nausea, light-colored stool, dark yellow or [name]  urine, yellowing skin or eyes, unusual weakness or fatigue Low red blood cell level--unusual weakness or fatigue, dizziness, headache, trouble breathing Tumor lysis syndrome (TLS)--nausea, vomiting, diarrhea, decrease in the amount of urine, dark urine, unusual weakness or fatigue, confusion, muscle pain or cramps, fast or irregular heartbeat, joint pain Unusual bruising or bleeding Side effects that usually do not require medical attention (report to your care team if they continue or are bothersome): Constipation Diarrhea Nausea Pain, redness, or irritation at injection site Vomiting This list may not describe all possible side effects. Call your doctor for medical advice about side effects. You may report side effects to FDA at 1-800-FDA-1088. Where should I keep my medication? This medication is given in a hospital or clinic. It will not be stored at home. NOTE: This sheet is a summary. It may not cover all possible information. If you have questions about this medicine, talk to your doctor, pharmacist, or health care provider.  2024 Elsevier/Gold Standard (2023-03-07 00:00:00)       To help prevent nausea and vomiting after your treatment, we encourage you to take your nausea medication as directed.  BELOW ARE SYMPTOMS THAT SHOULD BE REPORTED IMMEDIATELY: *FEVER GREATER THAN 100.4  F (38 C) OR HIGHER *CHILLS OR SWEATING *NAUSEA AND VOMITING THAT IS NOT CONTROLLED WITH YOUR NAUSEA MEDICATION *UNUSUAL SHORTNESS OF BREATH *UNUSUAL BRUISING OR BLEEDING *URINARY PROBLEMS (pain or burning when urinating, or frequent urination) *BOWEL PROBLEMS (unusual diarrhea, constipation, pain near the anus) TENDERNESS IN MOUTH AND THROAT WITH OR WITHOUT PRESENCE OF ULCERS (sore throat, sores in mouth, or a toothache) UNUSUAL RASH, SWELLING OR PAIN  UNUSUAL VAGINAL DISCHARGE OR ITCHING   Items with * indicate a potential emergency and should be followed up as soon as possible or go to the Emergency  Department if any problems should occur.  Please show the CHEMOTHERAPY ALERT CARD or IMMUNOTHERAPY ALERT CARD at check-in to the Emergency Department and triage nurse.  Should you have questions after your visit or need to cancel or reschedule your appointment, please contact [facility] [place] - A DEPT OF [facility] [phone]  and follow the prompts.  Office hours are 8:00 a.m. to 4:30 p.m. Monday - Friday. Please note that voicemails left after 4:00 p.m. may not be returned until the following business day.  We are closed weekends and major holidays. You have access to a nurse at all times for urgent questions. Please call the main number to the clinic [phone] and follow the prompts.  For any non-urgent questions, you may also contact your provider using MyChart. We now offer e-Visits for anyone 88 and older to request care online for non-urgent symptoms. For details visit [email or web address].   Also download the MyChart app! Go to the app store, search "MyChart", open the app, select [place], and log in with your MyChart username and password.

## 2024-02-09 ENCOUNTER — Encounter: Payer: Self-pay | Admitting: Hematology

## 2024-02-09 ENCOUNTER — Inpatient Hospital Stay

## 2024-02-09 VITALS — BP 106/63 | HR 72 | Temp 97.6°F | Resp 18

## 2024-02-09 DIAGNOSIS — D539 Nutritional anemia, unspecified: Secondary | ICD-10-CM | POA: Diagnosis not present

## 2024-02-09 DIAGNOSIS — R634 Abnormal weight loss: Secondary | ICD-10-CM | POA: Diagnosis not present

## 2024-02-09 DIAGNOSIS — Z95828 Presence of other vascular implants and grafts: Secondary | ICD-10-CM

## 2024-02-09 DIAGNOSIS — C9310 Chronic myelomonocytic leukemia not having achieved remission: Secondary | ICD-10-CM | POA: Diagnosis not present

## 2024-02-09 DIAGNOSIS — D696 Thrombocytopenia, unspecified: Secondary | ICD-10-CM | POA: Diagnosis not present

## 2024-02-09 DIAGNOSIS — E538 Deficiency of other specified B group vitamins: Secondary | ICD-10-CM | POA: Diagnosis not present

## 2024-02-09 DIAGNOSIS — Z5111 Encounter for antineoplastic chemotherapy: Secondary | ICD-10-CM | POA: Diagnosis not present

## 2024-02-09 DIAGNOSIS — Z79899 Other long term (current) drug therapy: Secondary | ICD-10-CM | POA: Diagnosis not present

## 2024-02-09 DIAGNOSIS — D7581 Myelofibrosis: Secondary | ICD-10-CM | POA: Diagnosis not present

## 2024-02-09 DIAGNOSIS — K5900 Constipation, unspecified: Secondary | ICD-10-CM | POA: Diagnosis not present

## 2024-02-09 MED ORDER — HEPARIN SOD (PORK) LOCK FLUSH 100 UNIT/ML IV SOLN
500.0000 [IU] | Freq: Once | INTRAVENOUS | Status: AC
  Administered 2024-02-09: 500 [IU] via INTRAVENOUS

## 2024-02-09 MED ORDER — SODIUM CHLORIDE 0.9 % IV SOLN
75.0000 mg/m2 | Freq: Once | INTRAVENOUS | Status: AC
  Administered 2024-02-09: 126 mg via INTRAVENOUS
  Filled 2024-02-09: qty 12.6

## 2024-02-09 MED ORDER — SODIUM CHLORIDE 0.9 % IV SOLN
Freq: Once | INTRAVENOUS | Status: AC
Start: 2024-02-09 — End: 2024-02-09

## 2024-02-09 MED ORDER — SODIUM CHLORIDE 0.9% FLUSH
10.0000 mL | Freq: Once | INTRAVENOUS | Status: AC
Start: 2024-02-09 — End: 2024-02-09
  Administered 2024-02-09: 10 mL via INTRAVENOUS

## 2024-02-09 NOTE — Progress Notes (Signed)
 Patient presents today for Vidaza  D4 C19. Vital signs and lab work within parameters for treatment. See flowsheet. Patient denies any side effects related to treatment.   Treatment given today per MD orders. Tolerated infusion without adverse affects. Vital signs stable. No complaints at this time. Discharged from clinic ambulatory in stable condition. Alert and oriented x 3. F/U with [facility] as scheduled.

## 2024-02-09 NOTE — Patient Instructions (Signed)
 [facility] [place] - A DEPT OF [facility]  Discharge Instructions: Thank you for choosing [facility] to provide your oncology and hematology care.  If you have a lab appointment with the Cancer Center - please note that after April 8th, 2024, all labs will be drawn in the cancer center.  You do not have to check in or register with the main entrance as you have in the past but will complete your check-in in the cancer center.  Wear comfortable clothing and clothing appropriate for easy access to any Portacath or PICC line.   We strive to give you quality time with your provider. You may need to reschedule your appointment if you arrive late (15 or more minutes).  Arriving late affects you and other patients whose appointments are after yours.  Also, if you miss three or more appointments without notifying the office, you may be dismissed from the clinic at the provider's discretion.      For prescription refill requests, have your pharmacy contact our office and allow 72 hours for refills to be completed.    Today you received the following chemotherapy and/or immunotherapy agents Vidaza.  Azacitidine Injection What is this medication? AZACITIDINE (ay za SITE i deen) treats blood and bone marrow cancers. It works by slowing down the growth of cancer cells. This medicine may be used for other purposes; ask your health care provider or pharmacist if you have questions. COMMON BRAND NAME(S): Vidaza What should I tell my care team before I take this medication? They need to know if you have any of these conditions: Kidney disease Liver disease Low blood cell levels, such as low white cells, platelets, or red blood cells Low levels of albumin in the blood Low levels of bicarbonate in the blood An unusual or allergic reaction to azacitidine, mannitol, other medications, foods, dyes, or preservatives If you or your partner are pregnant or trying to get  pregnant Breast-feeding How should I use this medication? This medication is injected into a vein or under the skin. It is given by your care team in a hospital or clinic setting. Talk to your care team about the use of this medication in children. While it may be prescribed for children as young as 1 month for selected conditions, precautions do apply. Overdosage: If you think you have taken too much of this medicine contact a poison control center or emergency room at once. NOTE: This medicine is only for you. Do not share this medicine with others. What if I miss a dose? Keep appointments for follow-up doses. It is important not to miss your dose. Call your care team if you are unable to keep an appointment. What may interact with this medication? Interactions are not expected. This list may not describe all possible interactions. Give your health care provider a list of all the medicines, herbs, non-prescription drugs, or dietary supplements you use. Also tell them if you smoke, drink alcohol, or use illegal drugs. Some items may interact with your medicine. What should I watch for while using this medication? Your condition will be monitored carefully while you are receiving this medication. This medication may make you feel generally unwell. This is not uncommon as chemotherapy can affect healthy cells as well as cancer cells. Report any side effects. Continue your course of treatment even though you feel ill unless your care team tells you to stop. You may need blood work done while you are  taking this medication. Other product types may be available that contain the medication azacitidine. The injection and oral products should not be used in place of one another. Talk to your care team if you have questions. This medication can cause serious side effects. To reduce the risk, your care team may give you other medications to take before receiving this one. Be sure to follow the directions from  your care team. This medication may increase your risk of getting an infection. Call your care team for advice if you get a fever, chills, sore throat, or other symptoms of a cold or flu. Do not treat yourself. Try to avoid being around people who are sick. Avoid taking medications that contain aspirin, acetaminophen, ibuprofen, naproxen, or ketoprofen unless instructed by your care team. These medications may hide a fever. Be careful brushing or flossing your teeth or using a toothpick because you may get an infection or bleed more easily. If you have any dental work done, tell your dentist you are receiving this medication. Talk to your care team if you or your partner may be pregnant. Serious birth defects can occur if you take this medication during pregnancy and for 6 months after the last dose. You will need a negative pregnancy test before starting this medication. Contraception is recommended while taking this medication and for 6 months after the last dose. Your care team can help you find the option that works for you. If your partner can get pregnant, use a condom during sex while taking this medication and for 3 months after the last dose. Do not breastfeed while taking this medication and for 1 week after the last dose. This medication may cause infertility. Talk to your care team if you are concerned about your fertility. What side effects may I notice from receiving this medication? Side effects that you should report to your care team as soon as possible: Allergic reactions--skin rash, itching, hives, swelling of the face, lips, tongue, or throat Infection--fever, chills, cough, sore throat, wounds that don't heal, pain or trouble when passing urine, general feeling of discomfort or being unwell Kidney injury--decrease in the amount of urine, swelling of the ankles, hands, or feet Liver injury--right upper belly pain, loss of appetite, nausea, light-colored stool, dark yellow or [name]  urine, yellowing skin or eyes, unusual weakness or fatigue Low red blood cell level--unusual weakness or fatigue, dizziness, headache, trouble breathing Tumor lysis syndrome (TLS)--nausea, vomiting, diarrhea, decrease in the amount of urine, dark urine, unusual weakness or fatigue, confusion, muscle pain or cramps, fast or irregular heartbeat, joint pain Unusual bruising or bleeding Side effects that usually do not require medical attention (report to your care team if they continue or are bothersome): Constipation Diarrhea Nausea Pain, redness, or irritation at injection site Vomiting This list may not describe all possible side effects. Call your doctor for medical advice about side effects. You may report side effects to FDA at 1-800-FDA-1088. Where should I keep my medication? This medication is given in a hospital or clinic. It will not be stored at home. NOTE: This sheet is a summary. It may not cover all possible information. If you have questions about this medicine, talk to your doctor, pharmacist, or health care provider.  2024 Elsevier/Gold Standard (2023-03-07 00:00:00)       To help prevent nausea and vomiting after your treatment, we encourage you to take your nausea medication as directed.  BELOW ARE SYMPTOMS THAT SHOULD BE REPORTED IMMEDIATELY: *FEVER GREATER THAN 100.4  F (38 C) OR HIGHER *CHILLS OR SWEATING *NAUSEA AND VOMITING THAT IS NOT CONTROLLED WITH YOUR NAUSEA MEDICATION *UNUSUAL SHORTNESS OF BREATH *UNUSUAL BRUISING OR BLEEDING *URINARY PROBLEMS (pain or burning when urinating, or frequent urination) *BOWEL PROBLEMS (unusual diarrhea, constipation, pain near the anus) TENDERNESS IN MOUTH AND THROAT WITH OR WITHOUT PRESENCE OF ULCERS (sore throat, sores in mouth, or a toothache) UNUSUAL RASH, SWELLING OR PAIN  UNUSUAL VAGINAL DISCHARGE OR ITCHING   Items with * indicate a potential emergency and should be followed up as soon as possible or go to the Emergency  Department if any problems should occur.  Please show the CHEMOTHERAPY ALERT CARD or IMMUNOTHERAPY ALERT CARD at check-in to the Emergency Department and triage nurse.  Should you have questions after your visit or need to cancel or reschedule your appointment, please contact [facility] [place] - A DEPT OF [facility] [phone]  and follow the prompts.  Office hours are 8:00 a.m. to 4:30 p.m. Monday - Friday. Please note that voicemails left after 4:00 p.m. may not be returned until the following business day.  We are closed weekends and major holidays. You have access to a nurse at all times for urgent questions. Please call the main number to the clinic [phone] and follow the prompts.  For any non-urgent questions, you may also contact your provider using MyChart. We now offer e-Visits for anyone 88 and older to request care online for non-urgent symptoms. For details visit [email or web address].   Also download the MyChart app! Go to the app store, search "MyChart", open the app, select [place], and log in with your MyChart username and password.

## 2024-02-10 ENCOUNTER — Encounter: Payer: Self-pay | Admitting: Hematology

## 2024-02-10 ENCOUNTER — Other Ambulatory Visit: Payer: Self-pay

## 2024-02-10 ENCOUNTER — Inpatient Hospital Stay

## 2024-02-10 VITALS — BP 104/64 | HR 69 | Temp 97.4°F | Resp 18

## 2024-02-10 DIAGNOSIS — C9310 Chronic myelomonocytic leukemia not having achieved remission: Secondary | ICD-10-CM | POA: Diagnosis not present

## 2024-02-10 DIAGNOSIS — E538 Deficiency of other specified B group vitamins: Secondary | ICD-10-CM | POA: Diagnosis not present

## 2024-02-10 DIAGNOSIS — K5900 Constipation, unspecified: Secondary | ICD-10-CM | POA: Diagnosis not present

## 2024-02-10 DIAGNOSIS — Z95828 Presence of other vascular implants and grafts: Secondary | ICD-10-CM

## 2024-02-10 DIAGNOSIS — R634 Abnormal weight loss: Secondary | ICD-10-CM | POA: Diagnosis not present

## 2024-02-10 DIAGNOSIS — Z0001 Encounter for general adult medical examination with abnormal findings: Secondary | ICD-10-CM | POA: Diagnosis not present

## 2024-02-10 DIAGNOSIS — D696 Thrombocytopenia, unspecified: Secondary | ICD-10-CM | POA: Diagnosis not present

## 2024-02-10 DIAGNOSIS — Z79899 Other long term (current) drug therapy: Secondary | ICD-10-CM | POA: Diagnosis not present

## 2024-02-10 DIAGNOSIS — Z5111 Encounter for antineoplastic chemotherapy: Secondary | ICD-10-CM | POA: Diagnosis not present

## 2024-02-10 DIAGNOSIS — D7581 Myelofibrosis: Secondary | ICD-10-CM | POA: Diagnosis not present

## 2024-02-10 DIAGNOSIS — D539 Nutritional anemia, unspecified: Secondary | ICD-10-CM | POA: Diagnosis not present

## 2024-02-10 MED ORDER — HEPARIN SOD (PORK) LOCK FLUSH 100 UNIT/ML IV SOLN
500.0000 [IU] | Freq: Once | INTRAVENOUS | Status: AC
Start: 2024-02-10 — End: 2024-02-10
  Administered 2024-02-10: 500 [IU] via INTRAVENOUS

## 2024-02-10 MED ORDER — SODIUM CHLORIDE 0.9 % IV SOLN
75.0000 mg/m2 | Freq: Once | INTRAVENOUS | Status: AC
  Administered 2024-02-10: 126 mg via INTRAVENOUS
  Filled 2024-02-10: qty 12.6

## 2024-02-10 MED ORDER — PALONOSETRON HCL INJECTION 0.25 MG/5ML
0.2500 mg | Freq: Once | INTRAVENOUS | Status: AC
  Administered 2024-02-10: 0.25 mg via INTRAVENOUS
  Filled 2024-02-10: qty 5

## 2024-02-10 MED ORDER — SODIUM CHLORIDE 0.9 % IV SOLN: Freq: Once | INTRAVENOUS | Status: AC

## 2024-02-10 MED ORDER — SODIUM CHLORIDE 0.9% FLUSH
10.0000 mL | Freq: Once | INTRAVENOUS | Status: AC
Start: 2024-02-10 — End: 2024-02-10
  Administered 2024-02-10: 10 mL via INTRAVENOUS

## 2024-02-10 NOTE — Progress Notes (Signed)
 Patient presents today for D5 Vidaza  infusion.  Patient is in satisfactory condition with no new complaints voiced.  Vital signs are stable.  Labs reviewed for 02/06/24 and all labs are within treatment parameters.  We will proceed with treatment per MD orders.    Treatment given today per MD orders. Tolerated infusion without adverse affects. Vital signs stable. No complaints at this time. Discharged from clinic ambulatory in stable condition. Alert and oriented x 3. F/U with [facility] as scheduled.

## 2024-02-10 NOTE — Progress Notes (Signed)
 Specialty Pharmacy Refill Coordination Note  [name] is a 73 y.o. female contacted today regarding refills of specialty medication(s) Tezepelumab -ekko (Tezspire )   Patient requested Courier to Provider Office   Delivery date: 02/14/24   Verified address: [facility] [place]- [address]   Medication will be filled on 07.28.25 appointment 8.4.25.

## 2024-02-10 NOTE — Patient Instructions (Signed)
 [facility] [place] - A DEPT OF [facility]  Discharge Instructions: Thank you for choosing [facility] to provide your oncology and hematology care.  If you have a lab appointment with the Cancer Center - please note that after April 8th, 2024, all labs will be drawn in the cancer center.  You do not have to check in or register with the main entrance as you have in the past but will complete your check-in in the cancer center.  Wear comfortable clothing and clothing appropriate for easy access to any Portacath or PICC line.   We strive to give you quality time with your provider. You may need to reschedule your appointment if you arrive late (15 or more minutes).  Arriving late affects you and other patients whose appointments are after yours.  Also, if you miss three or more appointments without notifying the office, you may be dismissed from the clinic at the provider's discretion.      For prescription refill requests, have your pharmacy contact our office and allow 72 hours for refills to be completed.    Today you received the following chemotherapy and/or immunotherapy agents D5 Vidaza      To help prevent nausea and vomiting after your treatment, we encourage you to take your nausea medication as directed.   Azacitidine Injection What is this medication? AZACITIDINE (ay za SITE i deen) treats blood and bone marrow cancers. It works by slowing down the growth of cancer cells. This medicine may be used for other purposes; ask your health care provider or pharmacist if you have questions. COMMON BRAND NAME(S): Vidaza What should I tell my care team before I take this medication? They need to know if you have any of these conditions: Kidney disease Liver disease Low blood cell levels, such as low white cells, platelets, or red blood cells Low levels of albumin in the blood Low levels of bicarbonate in the blood An unusual or allergic reaction to  azacitidine, mannitol, other medications, foods, dyes, or preservatives If you or your partner are pregnant or trying to get pregnant Breast-feeding How should I use this medication? This medication is injected into a vein or under the skin. It is given by your care team in a hospital or clinic setting. Talk to your care team about the use of this medication in children. While it may be prescribed for children as young as 1 month for selected conditions, precautions do apply. Overdosage: If you think you have taken too much of this medicine contact a poison control center or emergency room at once. NOTE: This medicine is only for you. Do not share this medicine with others. What if I miss a dose? Keep appointments for follow-up doses. It is important not to miss your dose. Call your care team if you are unable to keep an appointment. What may interact with this medication? Interactions are not expected. This list may not describe all possible interactions. Give your health care provider a list of all the medicines, herbs, non-prescription drugs, or dietary supplements you use. Also tell them if you smoke, drink alcohol, or use illegal drugs. Some items may interact with your medicine. What should I watch for while using this medication? Your condition will be monitored carefully while you are receiving this medication. This medication may make you feel generally unwell. This is not uncommon as chemotherapy can affect healthy cells as well as cancer cells. Report any side effects. Continue  your course of treatment even though you feel ill unless your care team tells you to stop. You may need blood work done while you are taking this medication. Other product types may be available that contain the medication azacitidine. The injection and oral products should not be used in place of one another. Talk to your care team if you have questions. This medication can cause serious side effects. To reduce  the risk, your care team may give you other medications to take before receiving this one. Be sure to follow the directions from your care team. This medication may increase your risk of getting an infection. Call your care team for advice if you get a fever, chills, sore throat, or other symptoms of a cold or flu. Do not treat yourself. Try to avoid being around people who are sick. Avoid taking medications that contain aspirin, acetaminophen, ibuprofen, naproxen, or ketoprofen unless instructed by your care team. These medications may hide a fever. Be careful brushing or flossing your teeth or using a toothpick because you may get an infection or bleed more easily. If you have any dental work done, tell your dentist you are receiving this medication. Talk to your care team if you or your partner may be pregnant. Serious birth defects can occur if you take this medication during pregnancy and for 6 months after the last dose. You will need a negative pregnancy test before starting this medication. Contraception is recommended while taking this medication and for 6 months after the last dose. Your care team can help you find the option that works for you. If your partner can get pregnant, use a condom during sex while taking this medication and for 3 months after the last dose. Do not breastfeed while taking this medication and for 1 week after the last dose. This medication may cause infertility. Talk to your care team if you are concerned about your fertility. What side effects may I notice from receiving this medication? Side effects that you should report to your care team as soon as possible: Allergic reactions--skin rash, itching, hives, swelling of the face, lips, tongue, or throat Infection--fever, chills, cough, sore throat, wounds that don't heal, pain or trouble when passing urine, general feeling of discomfort or being unwell Kidney injury--decrease in the amount of urine, swelling of the  ankles, hands, or feet Liver injury--right upper belly pain, loss of appetite, nausea, light-colored stool, dark yellow or brown urine, yellowing skin or eyes, unusual weakness or fatigue Low red blood cell level--unusual weakness or fatigue, dizziness, headache, trouble breathing Tumor lysis syndrome (TLS)--nausea, vomiting, diarrhea, decrease in the amount of urine, dark urine, unusual weakness or fatigue, confusion, muscle pain or cramps, fast or irregular heartbeat, joint pain Unusual bruising or bleeding Side effects that usually do not require medical attention (report to your care team if they continue or are bothersome): Constipation Diarrhea Nausea Pain, redness, or irritation at injection site Vomiting This list may not describe all possible side effects. Call your doctor for medical advice about side effects. You may report side effects to FDA at 1-800-FDA-1088. Where should I keep my medication? This medication is given in a hospital or clinic. It will not be stored at home. NOTE: This sheet is a summary. It may not cover all possible information. If you have questions about this medicine, talk to your doctor, pharmacist, or health care provider.  2024 Elsevier/Gold Standard (2023-03-07 00:00:00)  BELOW ARE SYMPTOMS THAT SHOULD BE REPORTED IMMEDIATELY: *FEVER GREATER THAN  100.4 F (38 C) OR HIGHER *CHILLS OR SWEATING *NAUSEA AND VOMITING THAT IS NOT CONTROLLED WITH YOUR NAUSEA MEDICATION *UNUSUAL SHORTNESS OF BREATH *UNUSUAL BRUISING OR BLEEDING *URINARY PROBLEMS (pain or burning when urinating, or frequent urination) *BOWEL PROBLEMS (unusual diarrhea, constipation, pain near the anus) TENDERNESS IN MOUTH AND THROAT WITH OR WITHOUT PRESENCE OF ULCERS (sore throat, sores in mouth, or a toothache) UNUSUAL RASH, SWELLING OR PAIN  UNUSUAL VAGINAL DISCHARGE OR ITCHING   Items with * indicate a potential emergency and should be followed up as soon as possible or go to the  Emergency Department if any problems should occur.  Please show the CHEMOTHERAPY ALERT CARD or IMMUNOTHERAPY ALERT CARD at check-in to the Emergency Department and triage nurse.  Should you have questions after your visit or need to cancel or reschedule your appointment, please contact [facility] [place] - A DEPT OF [facility]igh[facility] Bridegroom [facility] [phone]  and follow the prompts.  Office hours are 8:00 a.m. to 4:30 p.m. Monday - Friday. Please note that voicemails left after 4:00 p.m. may not be returned until the following business day.  We are closed weekends and major holidays. You have access to a nurse at all times for urgent questions. Please call the main number to the clinic [phone] and follow the prompts.  For any non-urgent questions, you may also contact your provider using MyChart. We now offer e-Visits for anyone 52 and older to request care online for non-urgent symptoms. For details visit [email or web address].   Also download the MyChart app! Go to the app store, search "MyChart", open the app, select [place], and log in with your MyChart username and password.

## 2024-02-13 ENCOUNTER — Inpatient Hospital Stay

## 2024-02-13 ENCOUNTER — Other Ambulatory Visit: Payer: Self-pay

## 2024-02-13 VITALS — BP 113/57 | HR 65 | Temp 96.4°F | Resp 19 | Wt 128.3 lb

## 2024-02-13 DIAGNOSIS — Z7409 Other reduced mobility: Secondary | ICD-10-CM | POA: Diagnosis not present

## 2024-02-13 DIAGNOSIS — D539 Nutritional anemia, unspecified: Secondary | ICD-10-CM | POA: Diagnosis not present

## 2024-02-13 DIAGNOSIS — K5900 Constipation, unspecified: Secondary | ICD-10-CM | POA: Diagnosis not present

## 2024-02-13 DIAGNOSIS — R293 Abnormal posture: Secondary | ICD-10-CM | POA: Diagnosis not present

## 2024-02-13 DIAGNOSIS — E538 Deficiency of other specified B group vitamins: Secondary | ICD-10-CM | POA: Diagnosis not present

## 2024-02-13 DIAGNOSIS — Z79899 Other long term (current) drug therapy: Secondary | ICD-10-CM | POA: Diagnosis not present

## 2024-02-13 DIAGNOSIS — D7581 Myelofibrosis: Secondary | ICD-10-CM | POA: Diagnosis not present

## 2024-02-13 DIAGNOSIS — C9310 Chronic myelomonocytic leukemia not having achieved remission: Secondary | ICD-10-CM | POA: Diagnosis not present

## 2024-02-13 DIAGNOSIS — M2560 Stiffness of unspecified joint, not elsewhere classified: Secondary | ICD-10-CM | POA: Diagnosis not present

## 2024-02-13 DIAGNOSIS — M6281 Muscle weakness (generalized): Secondary | ICD-10-CM | POA: Diagnosis not present

## 2024-02-13 DIAGNOSIS — Z5111 Encounter for antineoplastic chemotherapy: Secondary | ICD-10-CM | POA: Diagnosis not present

## 2024-02-13 DIAGNOSIS — D696 Thrombocytopenia, unspecified: Secondary | ICD-10-CM | POA: Diagnosis not present

## 2024-02-13 DIAGNOSIS — R634 Abnormal weight loss: Secondary | ICD-10-CM | POA: Diagnosis not present

## 2024-02-13 DIAGNOSIS — Z95828 Presence of other vascular implants and grafts: Secondary | ICD-10-CM

## 2024-02-13 LAB — CBC WITH DIFFERENTIAL/PLATELET
Abs Immature Granulocytes: 0 K/uL (ref 0.00–0.07)
Basophils Absolute: 0 K/uL (ref 0.0–0.1)
Basophils Relative: 1 %
Eosinophils Absolute: 0.2 K/uL (ref 0.0–0.5)
Eosinophils Relative: 9 %
HCT: 27 % — ABNORMAL LOW (ref 36.0–46.0)
Hemoglobin: 8.3 g/dL — ABNORMAL LOW (ref 12.0–15.0)
Lymphocytes Relative: 29 %
Lymphs Abs: 0.6 K/uL — ABNORMAL LOW (ref 0.7–4.0)
MCH: 31.3 pg (ref 26.0–34.0)
MCHC: 30.7 g/dL (ref 30.0–36.0)
MCV: 101.9 fL — ABNORMAL HIGH (ref 80.0–100.0)
Metamyelocytes Relative: 2 %
Monocytes Absolute: 0 K/uL — ABNORMAL LOW (ref 0.1–1.0)
Monocytes Relative: 2 %
Neutro Abs: 1.1 K/uL — ABNORMAL LOW (ref 1.7–7.7)
Neutrophils Relative %: 57 %
Platelets: 101 K/uL — ABNORMAL LOW (ref 150–400)
RBC: 2.65 MIL/uL — ABNORMAL LOW (ref 3.87–5.11)
RDW: 19.8 % — ABNORMAL HIGH (ref 11.5–15.5)
WBC: 2 K/uL — ABNORMAL LOW (ref 4.0–10.5)
nRBC: 1 /100{WBCs} — ABNORMAL HIGH
nRBC: 2 % — ABNORMAL HIGH (ref 0.0–0.2)

## 2024-02-13 LAB — COMPREHENSIVE METABOLIC PANEL WITH GFR
ALT: 20 U/L (ref 0–44)
AST: 26 U/L (ref 15–41)
Albumin: 3.7 g/dL (ref 3.5–5.0)
Alkaline Phosphatase: 57 U/L (ref 38–126)
Anion gap: 5 (ref 5–15)
BUN: 13 mg/dL (ref 8–23)
CO2: 28 mmol/L (ref 22–32)
Calcium: 9 mg/dL (ref 8.9–10.3)
Chloride: 103 mmol/L (ref 98–111)
Creatinine, Ser: 0.88 mg/dL (ref 0.44–1.00)
GFR, Estimated: >60 mL/min (ref >60)
Glucose, Bld: 106 mg/dL — ABNORMAL HIGH (ref 70–99)
Potassium: 3.9 mmol/L (ref 3.5–5.1)
Sodium: 136 mmol/L (ref 135–145)
Total Bilirubin: 0.9 mg/dL (ref 0.0–1.2)
Total Protein: 6.6 g/dL (ref 6.5–8.1)

## 2024-02-13 LAB — MAGNESIUM: Magnesium: 2 mg/dL (ref 1.7–2.4)

## 2024-02-13 MED ORDER — SODIUM CHLORIDE 0.9 % IV SOLN
75.0000 mg/m2 | Freq: Once | INTRAVENOUS | Status: AC
  Administered 2024-02-13: 126 mg via INTRAVENOUS
  Filled 2024-02-13: qty 12.6

## 2024-02-13 MED ORDER — PALONOSETRON HCL INJECTION 0.25 MG/5ML
0.2500 mg | Freq: Once | INTRAVENOUS | Status: AC
  Administered 2024-02-13: 0.25 mg via INTRAVENOUS
  Filled 2024-02-13: qty 5

## 2024-02-13 MED ORDER — HEPARIN SOD (PORK) LOCK FLUSH 100 UNIT/ML IV SOLN
500.0000 [IU] | Freq: Once | INTRAVENOUS | Status: AC
Start: 2024-02-13 — End: 2024-02-13
  Administered 2024-02-13: 500 [IU] via INTRAVENOUS

## 2024-02-13 MED ORDER — SODIUM CHLORIDE 0.9% FLUSH
10.0000 mL | Freq: Once | INTRAVENOUS | Status: AC
Start: 2024-02-13 — End: 2024-02-13
  Administered 2024-02-13: 10 mL via INTRAVENOUS

## 2024-02-13 MED ORDER — SODIUM CHLORIDE 0.9 % IV SOLN
Freq: Once | INTRAVENOUS | Status: AC
Start: 2024-02-13 — End: 2024-02-13

## 2024-02-13 NOTE — Progress Notes (Signed)
Patient tolerated chemotherapy with no complaints voiced.  Side effects with management reviewed with understanding verbalized.  Port site clean and dry with no bruising or swelling noted at site.  Good blood return noted before and after administration of chemotherapy.  Dressing intact.   Patient left in satisfactory condition with VSS and no s/s of distress noted.  

## 2024-02-13 NOTE — Patient Instructions (Signed)
 [facility] [place] [facility]  Discharge Instructions: Thank you for choosing [facility] to provide your oncology and hematology care.  If you have a lab appointment with the Cancer Center - please note that after April 8th, 2024, all labs will be drawn in the cancer center.  You do not have to check in or register with the main entrance as you have in the past but will complete your check-in in the cancer center.  Wear comfortable clothing and clothing appropriate for easy access to any Portacath or PICC line.   We strive to give you quality time with your provider. You may need to reschedule your appointment if you arrive late (15 or more minutes).  Arriving late affects you and other patients whose appointments are after yours.  Also, if you miss three or more appointments without notifying the office, you may be dismissed from the clinic at the provider's discretion.      For prescription refill requests, have your pharmacy contact our office and allow 72 hours for refills to be completed.    Today you received the following chemotherapy and/or immunotherapy agents vidaza.       To help prevent nausea and vomiting after your treatment, we encourage you to take your nausea medication as directed.  BELOW ARE SYMPTOMS THAT SHOULD BE REPORTED IMMEDIATELY: *FEVER GREATER THAN 100.4 F (38 C) OR HIGHER *CHILLS OR SWEATING *NAUSEA AND VOMITING THAT IS NOT CONTROLLED WITH YOUR NAUSEA MEDICATION *UNUSUAL SHORTNESS OF BREATH *UNUSUAL BRUISING OR BLEEDING *URINARY PROBLEMS (pain or burning when urinating, or frequent urination) *BOWEL PROBLEMS (unusual diarrhea, constipation, pain near the anus) TENDERNESS IN MOUTH AND THROAT WITH OR WITHOUT PRESENCE OF ULCERS (sore throat, sores in mouth, or a toothache) UNUSUAL RASH, SWELLING OR PAIN  UNUSUAL VAGINAL DISCHARGE OR ITCHING   Items with * indicate a potential emergency and should be followed up  as soon as possible or go to the Emergency Department if any problems should occur.  Please show the CHEMOTHERAPY ALERT CARD or IMMUNOTHERAPY ALERT CARD at check-in to the Emergency Department and triage nurse.  Should you have questions after your visit or need to cancel or reschedule your appointment, please contact [facility] CANCER CTR [place] - A DEPT OF [facility] Bridegroom [facility] [phone]  and follow the prompts.  Office hours are 8:00 a.m. to 4:30 p.m. Monday - Friday. Please note that voicemails left after 4:00 p.m. may not be returned until the following business day.  We are closed weekends and major holidays. You have access to a nurse at all times for urgent questions. Please call the main number to the clinic [phone] and follow the prompts.  For any non-urgent questions, you may also contact your provider using MyChart. We now offer e-Visits for anyone 8 and older to request care online for non-urgent symptoms. For details visit [email or web address].   Also download the MyChart app! Go to the app store, search "MyChart", open the app, select [place], and log in with your MyChart username and password.

## 2024-02-14 ENCOUNTER — Inpatient Hospital Stay

## 2024-02-14 VITALS — BP 118/60 | HR 66 | Temp 98.3°F | Resp 18

## 2024-02-14 DIAGNOSIS — Z79899 Other long term (current) drug therapy: Secondary | ICD-10-CM | POA: Diagnosis not present

## 2024-02-14 DIAGNOSIS — D7581 Myelofibrosis: Secondary | ICD-10-CM | POA: Diagnosis not present

## 2024-02-14 DIAGNOSIS — R634 Abnormal weight loss: Secondary | ICD-10-CM | POA: Diagnosis not present

## 2024-02-14 DIAGNOSIS — E538 Deficiency of other specified B group vitamins: Secondary | ICD-10-CM | POA: Diagnosis not present

## 2024-02-14 DIAGNOSIS — D539 Nutritional anemia, unspecified: Secondary | ICD-10-CM | POA: Diagnosis not present

## 2024-02-14 DIAGNOSIS — D696 Thrombocytopenia, unspecified: Secondary | ICD-10-CM | POA: Diagnosis not present

## 2024-02-14 DIAGNOSIS — K5900 Constipation, unspecified: Secondary | ICD-10-CM | POA: Diagnosis not present

## 2024-02-14 DIAGNOSIS — Z95828 Presence of other vascular implants and grafts: Secondary | ICD-10-CM

## 2024-02-14 DIAGNOSIS — C9310 Chronic myelomonocytic leukemia not having achieved remission: Secondary | ICD-10-CM | POA: Diagnosis not present

## 2024-02-14 DIAGNOSIS — Z5111 Encounter for antineoplastic chemotherapy: Secondary | ICD-10-CM | POA: Diagnosis not present

## 2024-02-14 MED ORDER — SODIUM CHLORIDE 0.9% FLUSH
10.0000 mL | Freq: Once | INTRAVENOUS | Status: AC
Start: 2024-02-14 — End: 2024-02-14
  Administered 2024-02-14: 10 mL via INTRAVENOUS

## 2024-02-14 MED ORDER — SODIUM CHLORIDE 0.9 % IV SOLN
75.0000 mg/m2 | Freq: Once | INTRAVENOUS | Status: AC
  Administered 2024-02-14: 126 mg via INTRAVENOUS
  Filled 2024-02-14: qty 12.6

## 2024-02-14 MED ORDER — HEPARIN SOD (PORK) LOCK FLUSH 100 UNIT/ML IV SOLN
500.0000 [IU] | Freq: Once | INTRAVENOUS | Status: AC
Start: 2024-02-14 — End: 2024-02-14
  Administered 2024-02-14: 500 [IU] via INTRAVENOUS

## 2024-02-14 MED ORDER — SODIUM CHLORIDE 0.9 % IV SOLN
Freq: Once | INTRAVENOUS | Status: AC
Start: 2024-02-14 — End: 2024-02-14

## 2024-02-14 NOTE — Patient Instructions (Signed)
 [facility] [place] - [facility]  Discharge Instructions: Thank you for choosing [facility] to provide your oncology and hematology care.  If you have a lab appointment with the Cancer Center - please note that after April 8th, 2024, all labs will be drawn in the cancer center.  You do not have to check in or register with the main entrance as you have in the past but will complete your check-in in the cancer center.  Wear comfortable clothing and clothing appropriate for easy access to any Portacath or PICC line.   We strive to give you quality time with your provider. You may need to reschedule your appointment if you arrive late (15 or more minutes).  Arriving late affects you and other patients whose appointments are after yours.  Also, if you miss three or more appointments without notifying the office, you may be dismissed from the clinic at the provider's discretion.      For prescription refill requests, have your pharmacy contact our office and allow 72 hours for refills to be completed.    Today you received the following chemotherapy and/or immunotherapy agents Vidaza.  Azacitidine Injection What is this medication? AZACITIDINE (ay za SITE i deen) treats blood and bone marrow cancers. It works by slowing down the growth of cancer cells. This medicine may be used for other purposes; ask your health care provider or pharmacist if you have questions. COMMON BRAND NAME(S): Vidaza What should I tell my care team before I take this medication? They need to know if you have any of these conditions: Kidney disease Liver disease Low blood cell levels, such as low white cells, platelets, or red blood cells Low levels of albumin in the blood Low levels of bicarbonate in the blood An unusual or allergic reaction to azacitidine, mannitol, other medications, foods, dyes, or preservatives If you or your partner are pregnant or trying to get  pregnant Breast-feeding How should I use this medication? This medication is injected into a vein or under the skin. It is given by your care team in a hospital or clinic setting. Talk to your care team about the use of this medication in children. While it may be prescribed for children as young as 1 month for selected conditions, precautions do apply. Overdosage: If you think you have taken too much of this medicine contact a poison control center or emergency room at once. NOTE: This medicine is only for you. Do not share this medicine with others. What if I miss a dose? Keep appointments for follow-up doses. It is important not to miss your dose. Call your care team if you are unable to keep an appointment. What may interact with this medication? Interactions are not expected. This list may not describe all possible interactions. Give your health care provider a list of all the medicines, herbs, non-prescription drugs, or dietary supplements you use. Also tell them if you smoke, drink alcohol, or use illegal drugs. Some items may interact with your medicine. What should I watch for while using this medication? Your condition will be monitored carefully while you are receiving this medication. This medication may make you feel generally unwell. This is not uncommon as chemotherapy can affect healthy cells as well as cancer cells. Report any side effects. Continue your course of treatment even though you feel ill unless your care team tells you to stop. You may need blood work done while you are  taking this medication. Other product types may be available that contain the medication azacitidine. The injection and oral products should not be used in place of one another. Talk to your care team if you have questions. This medication can cause serious side effects. To reduce the risk, your care team may give you other medications to take before receiving this one. Be sure to follow the directions from  your care team. This medication may increase your risk of getting an infection. Call your care team for advice if you get a fever, chills, sore throat, or other symptoms of a cold or flu. Do not treat yourself. Try to avoid being around people who are sick. Avoid taking medications that contain aspirin, acetaminophen, ibuprofen, naproxen, or ketoprofen unless instructed by your care team. These medications may hide a fever. Be careful brushing or flossing your teeth or using a toothpick because you may get an infection or bleed more easily. If you have any dental work done, tell your dentist you are receiving this medication. Talk to your care team if you or your partner may be pregnant. Serious birth defects can occur if you take this medication during pregnancy and for 6 months after the last dose. You will need a negative pregnancy test before starting this medication. Contraception is recommended while taking this medication and for 6 months after the last dose. Your care team can help you find the option that works for you. If your partner can get pregnant, use a condom during sex while taking this medication and for 3 months after the last dose. Do not breastfeed while taking this medication and for 1 week after the last dose. This medication may cause infertility. Talk to your care team if you are concerned about your fertility. What side effects may I notice from receiving this medication? Side effects that you should report to your care team as soon as possible: Allergic reactions--skin rash, itching, hives, swelling of the face, lips, tongue, or throat Infection--fever, chills, cough, sore throat, wounds that don't heal, pain or trouble when passing urine, general feeling of discomfort or being unwell Kidney injury--decrease in the amount of urine, swelling of the ankles, hands, or feet Liver injury--right upper belly pain, loss of appetite, nausea, light-colored stool, dark yellow or Iyla Balzarini  urine, yellowing skin or eyes, unusual weakness or fatigue Low red blood cell level--unusual weakness or fatigue, dizziness, headache, trouble breathing Tumor lysis syndrome (TLS)--nausea, vomiting, diarrhea, decrease in the amount of urine, dark urine, unusual weakness or fatigue, confusion, muscle pain or cramps, fast or irregular heartbeat, joint pain Unusual bruising or bleeding Side effects that usually do not require medical attention (report to your care team if they continue or are bothersome): Constipation Diarrhea Nausea Pain, redness, or irritation at injection site Vomiting This list may not describe all possible side effects. Call your doctor for medical advice about side effects. You may report side effects to FDA at 1-800-FDA-1088. Where should I keep my medication? This medication is given in a hospital or clinic. It will not be stored at home. NOTE: This sheet is a summary. It may not cover all possible information. If you have questions about this medicine, talk to your doctor, pharmacist, or health care provider.  2024 Elsevier/Gold Standard (2023-03-07 00:00:00)       To help prevent nausea and vomiting after your treatment, we encourage you to take your nausea medication as directed.  BELOW ARE SYMPTOMS THAT SHOULD BE REPORTED IMMEDIATELY: *FEVER GREATER THAN 100.4  F (38 C) OR HIGHER *CHILLS OR SWEATING *NAUSEA AND VOMITING THAT IS NOT CONTROLLED WITH YOUR NAUSEA MEDICATION *UNUSUAL SHORTNESS OF BREATH *UNUSUAL BRUISING OR BLEEDING *URINARY PROBLEMS (pain or burning when urinating, or frequent urination) *BOWEL PROBLEMS (unusual diarrhea, constipation, pain near the anus) TENDERNESS IN MOUTH AND THROAT WITH OR WITHOUT PRESENCE OF ULCERS (sore throat, sores in mouth, or a toothache) UNUSUAL RASH, SWELLING OR PAIN  UNUSUAL VAGINAL DISCHARGE OR ITCHING   Items with * indicate a potential emergency and should be followed up as soon as possible or go to the Emergency  Department if any problems should occur.  Please show the CHEMOTHERAPY ALERT CARD or IMMUNOTHERAPY ALERT CARD at check-in to the Emergency Department and triage nurse.  Should you have questions after your visit or need to cancel or reschedule your appointment, please contact [facility] CANCER CTR [place] - A DEPT OF Eligha Bridegroom [facility] [phone]  and follow the prompts.  Office hours are 8:00 a.m. to 4:30 p.m. Monday - Friday. Please note that voicemails left after 4:00 p.m. may not be returned until the following business day.  We are closed weekends and major holidays. You have access to a nurse at all times for urgent questions. Please call the main number to the clinic [phone] and follow the prompts.  For any non-urgent questions, you may also contact your provider using MyChart. We now offer e-Visits for anyone 88 and older to request care online for non-urgent symptoms. For details visit [email or web address].   Also download the MyChart app! Go to the app store, search "MyChart", open the app, select [place], and log in with your MyChart username and password.

## 2024-02-14 NOTE — Progress Notes (Signed)
 Patient presents today for 7th day chemotherapy infusion of Vidaza .  Patient is in satisfactory condition with no new complaints voiced.  Vital signs are stable.  VS in parameters for treatment. We will proceed with treatment per MD orders.    Patient tolerated treatment well with no complaints voiced.  Patient left ambulatory in stable condition.  Vital signs stable at discharge.  Follow up as scheduled.

## 2024-02-20 ENCOUNTER — Ambulatory Visit

## 2024-02-20 ENCOUNTER — Other Ambulatory Visit: Payer: Self-pay | Admitting: Hematology

## 2024-02-20 DIAGNOSIS — G4700 Insomnia, unspecified: Secondary | ICD-10-CM

## 2024-02-20 DIAGNOSIS — J4550 Severe persistent asthma, uncomplicated: Secondary | ICD-10-CM

## 2024-02-22 DIAGNOSIS — M2560 Stiffness of unspecified joint, not elsewhere classified: Secondary | ICD-10-CM | POA: Diagnosis not present

## 2024-02-22 DIAGNOSIS — Z7409 Other reduced mobility: Secondary | ICD-10-CM | POA: Diagnosis not present

## 2024-02-22 DIAGNOSIS — R293 Abnormal posture: Secondary | ICD-10-CM | POA: Diagnosis not present

## 2024-02-22 DIAGNOSIS — M6281 Muscle weakness (generalized): Secondary | ICD-10-CM | POA: Diagnosis not present

## 2024-02-24 DIAGNOSIS — Z01818 Encounter for other preprocedural examination: Secondary | ICD-10-CM | POA: Diagnosis not present

## 2024-02-24 DIAGNOSIS — C9310 Chronic myelomonocytic leukemia not having achieved remission: Secondary | ICD-10-CM | POA: Diagnosis not present

## 2024-02-24 DIAGNOSIS — Z7682 Awaiting organ transplant status: Secondary | ICD-10-CM | POA: Diagnosis not present

## 2024-02-24 DIAGNOSIS — C9200 Acute myeloblastic leukemia, not having achieved remission: Secondary | ICD-10-CM | POA: Diagnosis not present

## 2024-02-28 ENCOUNTER — Telehealth: Payer: Self-pay

## 2024-02-28 NOTE — Telephone Encounter (Signed)
 Per fax received from [facility] - DOB verified- faxed patient office notes for PAP program.

## 2024-02-29 DIAGNOSIS — Z7409 Other reduced mobility: Secondary | ICD-10-CM | POA: Diagnosis not present

## 2024-02-29 DIAGNOSIS — M2560 Stiffness of unspecified joint, not elsewhere classified: Secondary | ICD-10-CM | POA: Diagnosis not present

## 2024-02-29 DIAGNOSIS — R293 Abnormal posture: Secondary | ICD-10-CM | POA: Diagnosis not present

## 2024-02-29 DIAGNOSIS — M6281 Muscle weakness (generalized): Secondary | ICD-10-CM | POA: Diagnosis not present

## 2024-03-04 NOTE — Progress Notes (Signed)
 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 female was transferred to my care after her prior physician has left.   ASSESSMENT & PLAN:   Assessment & Plan: [name]  is a 73 y.o. 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 - Patient met with Dr. [name] at [facility] for bone marrow transplant.  She is undergoing workup for potential transplant and does have some donors available.  Will be seeing them back mid September - Last transfusion was 12/19/18/2025 and prior to that was 08/02/2023  Return to clinic in 4 weeks for follow-up   Folate deficiency Patient previously had folic acid  deficiency  - Continue folic acid  1 mg daily    The patient understands the plans discussed today and is in agreement with them.  She knows to contact our office if she develops concerns prior to her next appointment.  40 minutes of total time was spent for this patient encounter, including preparation, face-to-face counseling with the patient and coordination of care, physical exam, and documentation of the encounter. > 50% of the time was spent on counseling as documented under my assessment and plan.    LILLETTE Verneta SAUNDERS Teague,acting as a Neurosurgeon for  Mickiel Dry, MD.,have documented all relevant documentation on the behalf of Mickiel Dry, MD,as directed by  Mickiel Dry, MD while in the presence of Mickiel Dry, MD.  I, Mickiel Dry MD, have reviewed the above documentation for accuracy and completeness, and I agree with the above.     Mickiel Dry, MD  [facility] [place] - A DEPT OF [facility] [address] [name] Dept: [phone] Dept Fax: [phone]   Orders Placed This Encounter  Procedures   Ferritin    Standing Status:   Future    Expected Date:   04/02/2024    Expiration Date:   07/01/2024   Folate    Standing Status:   Future    Expected Date:   04/02/2024    Expiration Date:   07/01/2024   Vitamin B12    Standing Status:   Future    Expected Date:   04/02/2024    Expiration Date:   07/01/2024   Iron and TIBC    Standing Status:   Future    Expected Date:   04/02/2024    Expiration Date:   07/01/2024     ONCOLOGY HISTORY:   I have reviewed her chart and materials related to her cancer extensively and collaborated history with the patient. 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 HGB 11. MCV 96. PLT 97.  -03/24/2022: WBC 8.1 with N  at 43%, L at 17%, M at 19%, and B at 2%. HGB 9.6. MCV 99. PLT 115.  -04/17/2022: WBC 15.8. HGB 9.7. PLT 128.  -04/26/2022: Peripheral Smear Review: Leukocytosis with left shifted granulocytes and 1% blasts. Macrocytic anemia, thrombocytopenia, and monocytosis.  -04/26/2022: Peripheral flow cytometry: Mildly increased blasts (1%) -05/25/2022 Bone Marrow Biopsy: Hypercellular bone marrow with myeloid hyperplasia with dysgranulopoiesis, erythroid hypoplasia and megakaryocytic hyperplasia with dyspoiesis. Bone marrow blasts less than 5%. Peripheral blood blasts less  than 2%. Cytogenetics showed normal Karyotype. MDS FISH analysis results were normal. -Flow Cytometry: 20% of the aspirate contain monocytes (CD11b, CD13, CD14, CD33, CD38, CD56, CD64 and HLA-DR). Blasts are not increased.  -The findings are consistent with a primary bone marrow neoplasm with both dysplastic and myeloproliferative features is best classified currently as a chronic myelomonocytic leukemia.  There are less than 2% blasts in the blood and 5% blasts in the bone marrow consistent with CMML-0.  -Cytogenetics: Normal female karyotype -06/04/2022: Serum EPO level 41.4. LDH:416, BCR-ABL: Negative -06/04/2022: NGS: Positive for CBL, MPL, SRSF2, TET 2, RAD21  -06/21/2022: CT CAP: Asymmetrically enlarged left axillary and subpectoral lymph nodes measuring up to 1.2 x 1.1 cm, of uncertain significance and possibly reactive although at least of modest suspicion for lymphoproliferative disorder given other clinical findings. No other lymphadenopathy in the chest, abdomen, or pelvis. Splenomegaly 14.2 cm. -07/08/2022: Initial PET: Mild splenomegaly with normal metabolic activity. Uniform increase in marrow metabolic presumably relates to patient's anemia. Cannot exclude underlying leukemia/bone marrow malignancy. No focal activity present. No lymphadenopathy. -07/16/2022: Bone Marrow Biopsy: Hypercellular bone marrow (95-100%) with myeloid hyperplasia, atypical monocytes, and increased blasts (16% overall).  Atypical monocytosis present 23% of total events, expressing HLA-DR, CD38, CD4 dim, CD11C, CD13, CD14, CD36, CD64 with aberrant coexpression of CD56 and CD7.  -The findings are consistent with chronic myelomonocytic leukemia-2 with early progression to acute myeloid leukemia.  -08/02/2022 - 09/07/2022: 2 cycles of azacitidine  70 mg/m x 7 days every 28 days  -09/21/2022: Bone Marrow Biopsy:  Hypercellular bone marrow (70%) with markedly increased dyspoietic megakaryocytes, increased fibrosis,  and 3%  blasts. -09/27/2022-11/02/2022: 2 more cycles of azacitidine  70 mg/m x 7 days every 28 days (4 cycles) -11/16/2022: Bone Marrow Biopsy: Hypercellular marrow (70-80%) with marked dysmegakaryopoiesis, myelofibrosis and 3% blasts, on a very limited sample. -11/22/2022 - 06/24/2023: Azacitidine  decreased to 70 mg/m x 5 days every 28 days -05/06/2023: Bone Marrow Biopsy: Hypercellular marrow (85%) with cellularity with fibrosis and  and persistent involvement by this patient's known myeloid neoplasm (4% blasts by CD34 IHC analysis). Cytogenetics normal. NGS detected no gene fusions : MPL. SRSF2,RUNX1,TET2: Positive. -07/08/2023: Bone Marrow Biopsy: Hypercellular marrow (95%) with increased myelopoiesis, dysmegakrypoiesis, increased fibrosis and 5% blasts. Cytogenetics normal.  NGS: MPL, SRSF2, RUNX1, TET2. No gene fusions detected -07/25/2023-Current: Azacitidine  70 mg/m x 7 days every 28 days (increased)  -09/13/2023 Bone Marrow Biopsy: Hypercellular marrow (100%) with increased myelopoiesis and this megakaryocytes. Moderate myelofibrosis, grade 2 of 3. No increase in blasts (1%). Cytogenetics normal. -02/24/2024: Peripheral Smear review: Leukopenia with absolute neutropenia and lymphopenia with rare circulating blast seen on scan. Normocytic anemia with anisopoikilocytosis and rare nucleated red blood cells. Thrombocytopenia.   Current Treatment: azacitidine  70 mg/m x 7 days every 28 days  INTERVAL HISTORY:   [name] is here today for follow up and establish care with me for CMML-2.  She reported no complaints today.  She is tolerating azacitidine  well.  Her appetite is good and she is not  losing weight.  I have reviewed the past medical history, past surgical history, social history and family history with the patient and they are unchanged from previous note.  ALLERGIES:  is allergic to levonorgestrel-ethinyl estrad, sulfa antibiotics, sulfamethoxazole-trimethoprim, and  cephalosporins.  MEDICATIONS:  Current Outpatient Medications  Medication Sig Dispense Refill   albuterol  (VENTOLIN  HFA) 108 (90 Base) MCG/ACT inhaler INHALE TWO PUFFS EVERY 4 HOURS AS NEEDED FOR WHEEZING OR FOR SHORTNESS OF BREATH 18 g 1   albuterol  (VENTOLIN  HFA) 108 (90 Base) MCG/ACT inhaler INHALE 2 PUFFS BY MOUTH EVERY 4 HOURS AS NEEDED FOR SHORTNESS OF BREATH OR WHEEZING. 18 g 1   azaCITIDine  5 mg/2 mLs in lactated ringers  infusion Inject into the vein daily. Days 1-7 every 28 days     azelastine  (ASTELIN ) 0.1 % nasal spray SPRAY 1 TO 2 SPRAYS PER NOSTRIL TWICE DAILY. 30 mL 3   benzonatate  (TESSALON ) 200 MG capsule Take 200 mg by mouth 3 (three) times daily as needed.     BREZTRI  AEROSPHERE 160-9-4.8 MCG/ACT AERO Inhale 2 puffs into the lungs 2 (two) times daily. 10.7 g 5   cetirizine  (ZYRTEC ) 5 MG tablet Take 1 tablet (5 mg total) by mouth daily. 30 tablet 0   ciclopirox  (PENLAC ) 8 % solution APPLY TOPICALLY AT BEDTIME. APPLY OVER NAIL FOLD AND SURROUNDING SKIN. APPLY DAILY OVER PREVIOUS COAT. AFTER SEVEN DAYS, MAY REMOVE WITH ALCOHOL AND CONTINUE CYCLE. 6.6 mL 2   EPINEPHrine  0.3 mg/0.3 mL IJ SOAJ injection Inject 0.3 mg into the muscle as needed for anaphylaxis. 2 each 1   FLUoxetine (PROZAC) 20 MG capsule Take 1 capsule by mouth daily.     fluticasone  (FLONASE ) 50 MCG/ACT nasal spray Place 2 sprays into both nostrils 2 (two) times daily. 16 g 5   folic acid  (FOLVITE ) 1 MG tablet TAKE ONE TABLET BY MOUTH ONCE DAILY 30 tablet 5   gabapentin (NEURONTIN) 300 MG capsule      HYDROcodone  bit-homatropine (HYCODAN) 5-1.5 MG/5ML syrup      ipratropium (ATROVENT ) 0.06 % nasal spray USE TWO SPRAYS IN EACH NOSTRIL THREE TIMES DAILY AS NEEDED 15 mL 5   lactulose  (CHRONULAC ) 10 GM/15ML solution Take 15 mLs (10 g total) by mouth every 3 (three) hours as needed for mild constipation. 450 mL 2   lidocaine -prilocaine  (EMLA ) cream Apply a quarter sized amount to port a cath site and cover with plastic  wrap one hour prior to infusion appointments 30 g 3   megestrol  (MEGACE ) 400 MG/10ML suspension Take 10 mLs (400 mg total) by mouth 2 (two) times daily. 480 mL 2   meloxicam  (MOBIC ) 7.5 MG tablet Take 7.5 mg by mouth daily.     montelukast  (SINGULAIR ) 10 MG tablet Take 1 tablet (10 mg total) by mouth at bedtime. 30 tablet 5   NON FORMULARY [facility]  Antifungal (nail)-#1     pantoprazole  (PROTONIX ) 40 MG tablet TAKE ONE TABLET BY MOUTH ONCE DAILY 30 tablet 11   prochlorperazine  (COMPAZINE ) 10 MG tablet Take 1 tablet (10 mg total) by mouth every 6 (six) hours as needed for nausea or vomiting. 30 tablet 0   Respiratory Therapy Supplies (FLUTTER) DEVI Use as directed 1 each 3   SF 5000 PLUS 1.1 % CREA dental cream      Spacer/Aero-Holding Chambers (AEROCHAMBER PLUS WITH MASK) inhaler 1 each by Other route See admin instructions. Use with inhaler as instructed. 1 each 1   Tezepelumab -ekko (TEZSPIRE ) 210 MG/1. SOAJ Inject 210 mg into the skin  every 28 (twenty-eight) days. 1.91 mL 11   tiZANidine  (ZANAFLEX ) 2 MG tablet Take 1 tablet (2 mg total) by mouth 2 (two) times daily as needed for muscle spasms. 15 tablet 0   traZODone  (DESYREL ) 50 MG tablet TAKE ONE TABLET BY MOUTH AT BEDTIME 30 tablet 3   famotidine  (PEPCID ) 20 MG tablet TAKE ONE TABLET BY MOUTH TWICE DAILY 60 tablet 1   Current Facility-Administered Medications  Medication Dose Route Frequency Provider Last Rate Last Admin   tezepelumab -ekko (TEZSPIRE ) 210 MG/1. syringe 210 mg  210 mg Subcutaneous Q28 days Jeneal Danita Macintosh, MD   210 mg at 02/20/24 9167    REVIEW OF SYSTEMS:   Constitutional: Denies fevers, chills or abnormal weight loss Eyes: Denies blurriness of vision Ears, nose, mouth, throat, and face: Denies mucositis or sore throat Respiratory: Denies cough, dyspnea or wheezes Cardiovascular: Denies palpitation, chest discomfort or lower extremity swelling Gastrointestinal:  Denies nausea, heartburn  or change in bowel habits Skin: Denies abnormal skin rashes Lymphatics: Denies new lymphadenopathy or easy bruising Neurological:Denies numbness, tingling or new weaknesses Behavioral/Psych: Mood is stable, no new changes  All other systems were reviewed with the patient and are negative.   VITALS:  There were no vitals taken for this visit.  Wt Readings from Last 3 Encounters:  03/13/24 126 lb 1.7 oz (57.2 kg)  03/05/24 124 lb 1.9 oz (56.3 kg)  02/13/24 128 lb 4.8 oz (58.2 kg)    There is no height or weight on file to calculate BMI.  Performance status (ECOG): 1 - Symptomatic but completely ambulatory  PHYSICAL EXAM:   GENERAL:alert, no distress and comfortable SKIN: skin color, texture, turgor are normal, no rashes or significant lesions LYMPH:  no palpable lymphadenopathy in the cervical, axillary or inguinal LUNGS: clear to auscultation and percussion with normal breathing effort HEART: regular rate & rhythm and no murmurs and no lower extremity edema ABDOMEN:abdomen soft, non-tender and normal bowel sounds Musculoskeletal:no cyanosis of digits and no clubbing  NEURO: alert & oriented x 3 with fluent speech, no focal motor/sensory deficits  LABORATORY DATA:  I have reviewed the data as listed   Latest Reference Range & Units 03/05/24 08:27  Sodium 135 - 145 mmol/L 137  Potassium 3.5 - 5.1 mmol/L 3.9  Chloride 98 - 111 mmol/L 104  CO2 22 - 32 mmol/L 25  Glucose 70 - 99 mg/dL 91  BUN 8 - 23 mg/dL 10  Creatinine 9.55 - 8.99 mg/dL 9.17  Calcium 8.9 - 89.6 mg/dL 9.3  Anion gap 5 - 15  8  Magnesium 1.7 - 2.4 mg/dL 1.9  Alkaline Phosphatase 38 - 126 U/L 51  Albumin 3.5 - 5.0 g/dL 3.9  AST 15 - 41 U/L 31  ALT 0 - 44 U/L 14  Total Protein 6.5 - 8.1 g/dL 6.9  Total Bilirubin 0.0 - 1.2 mg/dL 1.3 (H)  GFR, Estimated >60 mL/min >60  LDH 98 - 192 U/L 545 (H)  (H): Data is abnormally high   Latest Reference Range & Units 03/05/24 08:27  WBC 4.0 - 10.5 K/uL 2.3 (L)  RBC  3.87 - 5.11 MIL/uL 2.95 (L)  Hemoglobin 12.0 - 15.0 g/dL 9.2 (L)  HCT 63.9 - 53.9 % 29.8 (L)  MCV 80.0 - 100.0 fL 101.0 (H)  MCH 26.0 - 34.0 pg 31.2  MCHC 30.0 - 36.0 g/dL 69.0  RDW 88.4 - 84.4 % 19.9 (H)  Platelets 150 - 400 K/uL 166  nRBC 0.0 - 0.2 % 3.0 (H)  Neutrophils % 48  Lymphocytes % 44  Monocytes Relative % 0  Eosinophil % 0  Basophil % 0  NEUT# 1.7 - 7.7 K/uL 1.2 (L)  Lymphs Abs 0.7 - 4.0 K/uL 1.0  Monocyte # 0.1 - 1.0 K/uL 0.0 (L)  Eosinophils Absolute 0.0 - 0.5 K/uL 0.0  Basophils Absolute 0.0 - 0.1 K/uL 0.0  Abs Immature Granulocytes 0.00 - 0.07 K/uL 0.10 (H)  Band Neutrophils % 2  Metamyelocytes Relative % 2  Myelocytes % 4  (L): Data is abnormally low (H): Data is abnormally high  RADIOGRAPHIC STUDIES: I have personally reviewed the radiological images as listed and agreed with the findings in the report.  DG Hand Complete Right CLINICAL DATA:  right hand pain x 1 week.  EXAM: RIGHT HAND - COMPLETE 3+ VIEW  COMPARISON:  None Available.  FINDINGS: There is diffuse osteopenia of the visualized osseous structures.  No acute fracture or dislocation.  No aggressive osseous lesion.  Note is made of negative ulnar variance.  Mild diffuse degenerative changes of imaged joints.  No periarticular osteopenia or bony erosions.  No periarticular soft tissue calcifications.  No radiopaque foreign bodies.  Soft tissues are within normal limits.  IMPRESSION: No acute osseous abnormality of the right hand.  Electronically Signed   By: Ree Molt M.D.   On: 02/03/2024 08:58

## 2024-03-05 ENCOUNTER — Inpatient Hospital Stay: Admitting: Oncology

## 2024-03-05 ENCOUNTER — Encounter: Payer: Self-pay | Admitting: Oncology

## 2024-03-05 ENCOUNTER — Inpatient Hospital Stay: Attending: Hematology

## 2024-03-05 ENCOUNTER — Inpatient Hospital Stay

## 2024-03-05 ENCOUNTER — Other Ambulatory Visit: Payer: Self-pay

## 2024-03-05 DIAGNOSIS — D696 Thrombocytopenia, unspecified: Secondary | ICD-10-CM | POA: Insufficient documentation

## 2024-03-05 DIAGNOSIS — E538 Deficiency of other specified B group vitamins: Secondary | ICD-10-CM

## 2024-03-05 DIAGNOSIS — C9310 Chronic myelomonocytic leukemia not having achieved remission: Secondary | ICD-10-CM

## 2024-03-05 DIAGNOSIS — Z79899 Other long term (current) drug therapy: Secondary | ICD-10-CM | POA: Insufficient documentation

## 2024-03-05 DIAGNOSIS — C9311 Chronic myelomonocytic leukemia, in remission: Secondary | ICD-10-CM

## 2024-03-05 DIAGNOSIS — Z95828 Presence of other vascular implants and grafts: Secondary | ICD-10-CM

## 2024-03-05 DIAGNOSIS — Z5111 Encounter for antineoplastic chemotherapy: Secondary | ICD-10-CM | POA: Diagnosis not present

## 2024-03-05 LAB — COMPREHENSIVE METABOLIC PANEL WITH GFR
ALT: 14 U/L (ref 0–44)
AST: 31 U/L (ref 15–41)
Albumin: 3.9 g/dL (ref 3.5–5.0)
Alkaline Phosphatase: 51 U/L (ref 38–126)
Anion gap: 8 (ref 5–15)
BUN: 10 mg/dL (ref 8–23)
CO2: 25 mmol/L (ref 22–32)
Calcium: 9.3 mg/dL (ref 8.9–10.3)
Chloride: 104 mmol/L (ref 98–111)
Creatinine, Ser: 0.82 mg/dL (ref 0.44–1.00)
GFR, Estimated: >60 mL/min (ref >60)
Glucose, Bld: 91 mg/dL (ref 70–99)
Potassium: 3.9 mmol/L (ref 3.5–5.1)
Sodium: 137 mmol/L (ref 135–145)
Total Bilirubin: 1.3 mg/dL — ABNORMAL HIGH (ref 0.0–1.2)
Total Protein: 6.9 g/dL (ref 6.5–8.1)

## 2024-03-05 LAB — CBC WITH DIFFERENTIAL/PLATELET
Abs Immature Granulocytes: 0.1 K/uL — ABNORMAL HIGH (ref 0.00–0.07)
Band Neutrophils: 2 %
Basophils Absolute: 0 K/uL (ref 0.0–0.1)
Basophils Relative: 0 %
Eosinophils Absolute: 0 K/uL (ref 0.0–0.5)
Eosinophils Relative: 0 %
HCT: 29.8 % — ABNORMAL LOW (ref 36.0–46.0)
Hemoglobin: 9.2 g/dL — ABNORMAL LOW (ref 12.0–15.0)
Lymphocytes Relative: 44 %
Lymphs Abs: 1 K/uL (ref 0.7–4.0)
MCH: 31.2 pg (ref 26.0–34.0)
MCHC: 30.9 g/dL (ref 30.0–36.0)
MCV: 101 fL — ABNORMAL HIGH (ref 80.0–100.0)
Metamyelocytes Relative: 2 %
Monocytes Absolute: 0 K/uL — ABNORMAL LOW (ref 0.1–1.0)
Monocytes Relative: 0 %
Myelocytes: 4 %
Neutro Abs: 1.2 K/uL — ABNORMAL LOW (ref 1.7–7.7)
Neutrophils Relative %: 48 %
Platelets: 166 K/uL (ref 150–400)
RBC: 2.95 MIL/uL — ABNORMAL LOW (ref 3.87–5.11)
RDW: 19.9 % — ABNORMAL HIGH (ref 11.5–15.5)
Smear Review: NORMAL
WBC: 2.3 K/uL — ABNORMAL LOW (ref 4.0–10.5)
nRBC: 3 % — ABNORMAL HIGH (ref 0.0–0.2)

## 2024-03-05 LAB — LACTATE DEHYDROGENASE: LDH: 545 U/L — ABNORMAL HIGH (ref 98–192)

## 2024-03-05 LAB — MAGNESIUM: Magnesium: 1.9 mg/dL (ref 1.7–2.4)

## 2024-03-05 MED ORDER — SODIUM CHLORIDE 0.9 % IV SOLN
75.0000 mg/m2 | Freq: Once | INTRAVENOUS | Status: AC
  Administered 2024-03-05: 126 mg via INTRAVENOUS
  Filled 2024-03-05: qty 12.6

## 2024-03-05 MED ORDER — PALONOSETRON HCL INJECTION 0.25 MG/5ML
0.2500 mg | Freq: Once | INTRAVENOUS | Status: AC
  Administered 2024-03-05: 0.25 mg via INTRAVENOUS
  Filled 2024-03-05: qty 5

## 2024-03-05 MED ORDER — SODIUM CHLORIDE 0.9 % IV SOLN: Freq: Once | INTRAVENOUS | Status: AC

## 2024-03-05 NOTE — Patient Instructions (Signed)

## 2024-03-05 NOTE — Progress Notes (Signed)
Patient tolerated chemotherapy with no complaints voiced.  Side effects with management reviewed with understanding verbalized.  Port site clean and dry with no bruising or swelling noted at site.  Good blood return noted before and after administration of chemotherapy.  Dressing intact.   Patient left in satisfactory condition with VSS and no s/s of distress noted.  

## 2024-03-05 NOTE — Progress Notes (Signed)
 OK to proceed with ANC 1.2  T.O. Dr Ivery Molt, PharmD

## 2024-03-05 NOTE — Assessment & Plan Note (Addendum)
 CMML-2, CPSS-Mol Genetic Risk Score = 2, genetic risk group: Intermediate-2, CPSS-Mol score=4 Currently on Azacitidine  75 mg/m for 7 days every 28 days  - Labs reviewed today: CMP: Slightly elevated total bilirubin, normal creatinine normal LFTs, LDH: 545, CBC: WBC: 2.3, hemoglobin: 9.2, platelets: 166, ANC: 1200 - She can proceed with her next cycle of azacitidine -cycle 20 today. - Patient met with Dr. [name] at [facility] for bone marrow transplant.  She is undergoing workup for potential transplant and does have some donors available.  Will be seeing them back mid September - Last transfusion was 12/19/18/2025 and prior to that was 08/02/2023  Return to clinic in 4 weeks for follow-up

## 2024-03-05 NOTE — Patient Instructions (Signed)
 [facility] [place] [facility]  Discharge Instructions: Thank you for choosing [facility] to provide your oncology and hematology care.  If you have a lab appointment with the Cancer Center - please note that after April 8th, 2024, all labs will be drawn in the cancer center.  You do not have to check in or register with the main entrance as you have in the past but will complete your check-in in the cancer center.  Wear comfortable clothing and clothing appropriate for easy access to any Portacath or PICC line.   We strive to give you quality time with your provider. You may need to reschedule your appointment if you arrive late (15 or more minutes).  Arriving late affects you and other patients whose appointments are after yours.  Also, if you miss three or more appointments without notifying the office, you may be dismissed from the clinic at the provider's discretion.      For prescription refill requests, have your pharmacy contact our office and allow 72 hours for refills to be completed.    Today you received the following chemotherapy and/or immunotherapy agents vidaza.       To help prevent nausea and vomiting after your treatment, we encourage you to take your nausea medication as directed.  BELOW ARE SYMPTOMS THAT SHOULD BE REPORTED IMMEDIATELY: *FEVER GREATER THAN 100.4 F (38 C) OR HIGHER *CHILLS OR SWEATING *NAUSEA AND VOMITING THAT IS NOT CONTROLLED WITH YOUR NAUSEA MEDICATION *UNUSUAL SHORTNESS OF BREATH *UNUSUAL BRUISING OR BLEEDING *URINARY PROBLEMS (pain or burning when urinating, or frequent urination) *BOWEL PROBLEMS (unusual diarrhea, constipation, pain near the anus) TENDERNESS IN MOUTH AND THROAT WITH OR WITHOUT PRESENCE OF ULCERS (sore throat, sores in mouth, or a toothache) UNUSUAL RASH, SWELLING OR PAIN  UNUSUAL VAGINAL DISCHARGE OR ITCHING   Items with * indicate a potential emergency and should be followed up  as soon as possible or go to the Emergency Department if any problems should occur.  Please show the CHEMOTHERAPY ALERT CARD or IMMUNOTHERAPY ALERT CARD at check-in to the Emergency Department and triage nurse.  Should you have questions after your visit or need to cancel or reschedule your appointment, please contact [facility] CANCER CTR [place] - A DEPT OF [facility] Bridegroom [facility] [phone]  and follow the prompts.  Office hours are 8:00 a.m. to 4:30 p.m. Monday - Friday. Please note that voicemails left after 4:00 p.m. may not be returned until the following business day.  We are closed weekends and major holidays. You have access to a nurse at all times for urgent questions. Please call the main number to the clinic [phone] and follow the prompts.  For any non-urgent questions, you may also contact your provider using MyChart. We now offer e-Visits for anyone 8 and older to request care online for non-urgent symptoms. For details visit [email or web address].   Also download the MyChart app! Go to the app store, search "MyChart", open the app, select [place], and log in with your MyChart username and password.

## 2024-03-05 NOTE — Progress Notes (Signed)
 Patient has been examined by Dr. Davonna. Vital signs and labs have been reviewed by MD - ANC, Creatinine, LFTs, hemoglobin, and platelets have been reviewed by M.D. - pt may proceed with treatment.  Primary RN and pharmacy notified.

## 2024-03-06 ENCOUNTER — Inpatient Hospital Stay

## 2024-03-06 ENCOUNTER — Encounter: Payer: Self-pay | Admitting: Oncology

## 2024-03-06 VITALS — BP 105/61 | HR 65 | Temp 98.0°F | Resp 18

## 2024-03-06 DIAGNOSIS — D696 Thrombocytopenia, unspecified: Secondary | ICD-10-CM | POA: Diagnosis not present

## 2024-03-06 DIAGNOSIS — Z95828 Presence of other vascular implants and grafts: Secondary | ICD-10-CM

## 2024-03-06 DIAGNOSIS — C9310 Chronic myelomonocytic leukemia not having achieved remission: Secondary | ICD-10-CM

## 2024-03-06 DIAGNOSIS — Z5111 Encounter for antineoplastic chemotherapy: Secondary | ICD-10-CM | POA: Diagnosis not present

## 2024-03-06 DIAGNOSIS — Z79899 Other long term (current) drug therapy: Secondary | ICD-10-CM | POA: Diagnosis not present

## 2024-03-06 MED ORDER — SODIUM CHLORIDE 0.9 % IV SOLN
75.0000 mg/m2 | Freq: Once | INTRAVENOUS | Status: AC
  Administered 2024-03-06: 126 mg via INTRAVENOUS
  Filled 2024-03-06: qty 12.6

## 2024-03-06 MED ORDER — SODIUM CHLORIDE 0.9 % IV SOLN: Freq: Once | INTRAVENOUS | Status: AC

## 2024-03-06 NOTE — Patient Instructions (Signed)
 [facility] [place] - A DEPT OF [facility]  Discharge Instructions: Thank you for choosing [facility] to provide your oncology and hematology care.  If you have a lab appointment with the Cancer Center - please note that after April 8th, 2024, all labs will be drawn in the cancer center.  You do not have to check in or register with the main entrance as you have in the past but will complete your check-in in the cancer center.  Wear comfortable clothing and clothing appropriate for easy access to any Portacath or PICC line.   We strive to give you quality time with your provider. You may need to reschedule your appointment if you arrive late (15 or more minutes).  Arriving late affects you and other patients whose appointments are after yours.  Also, if you miss three or more appointments without notifying the office, you may be dismissed from the clinic at the provider's discretion.      For prescription refill requests, have your pharmacy contact our office and allow 72 hours for refills to be completed.    Today you received the following chemotherapy and/or immunotherapy agents Vidaza.  Azacitidine Injection What is this medication? AZACITIDINE (ay za SITE i deen) treats blood and bone marrow cancers. It works by slowing down the growth of cancer cells. This medicine may be used for other purposes; ask your health care provider or pharmacist if you have questions. COMMON BRAND NAME(S): Vidaza What should I tell my care team before I take this medication? They need to know if you have any of these conditions: Kidney disease Liver disease Low blood cell levels, such as low white cells, platelets, or red blood cells Low levels of albumin in the blood Low levels of bicarbonate in the blood An unusual or allergic reaction to azacitidine, mannitol, other medications, foods, dyes, or preservatives If you or your partner are pregnant or trying to get  pregnant Breast-feeding How should I use this medication? This medication is injected into a vein or under the skin. It is given by your care team in a hospital or clinic setting. Talk to your care team about the use of this medication in children. While it may be prescribed for children as young as 1 month for selected conditions, precautions do apply. Overdosage: If you think you have taken too much of this medicine contact a poison control center or emergency room at once. NOTE: This medicine is only for you. Do not share this medicine with others. What if I miss a dose? Keep appointments for follow-up doses. It is important not to miss your dose. Call your care team if you are unable to keep an appointment. What may interact with this medication? Interactions are not expected. This list may not describe all possible interactions. Give your health care provider a list of all the medicines, herbs, non-prescription drugs, or dietary supplements you use. Also tell them if you smoke, drink alcohol, or use illegal drugs. Some items may interact with your medicine. What should I watch for while using this medication? Your condition will be monitored carefully while you are receiving this medication. This medication may make you feel generally unwell. This is not uncommon as chemotherapy can affect healthy cells as well as cancer cells. Report any side effects. Continue your course of treatment even though you feel ill unless your care team tells you to stop. You may need blood work done while you are  taking this medication. Other product types may be available that contain the medication azacitidine. The injection and oral products should not be used in place of one another. Talk to your care team if you have questions. This medication can cause serious side effects. To reduce the risk, your care team may give you other medications to take before receiving this one. Be sure to follow the directions from  your care team. This medication may increase your risk of getting an infection. Call your care team for advice if you get a fever, chills, sore throat, or other symptoms of a cold or flu. Do not treat yourself. Try to avoid being around people who are sick. Avoid taking medications that contain aspirin, acetaminophen, ibuprofen, naproxen, or ketoprofen unless instructed by your care team. These medications may hide a fever. Be careful brushing or flossing your teeth or using a toothpick because you may get an infection or bleed more easily. If you have any dental work done, tell your dentist you are receiving this medication. Talk to your care team if you or your partner may be pregnant. Serious birth defects can occur if you take this medication during pregnancy and for 6 months after the last dose. You will need a negative pregnancy test before starting this medication. Contraception is recommended while taking this medication and for 6 months after the last dose. Your care team can help you find the option that works for you. If your partner can get pregnant, use a condom during sex while taking this medication and for 3 months after the last dose. Do not breastfeed while taking this medication and for 1 week after the last dose. This medication may cause infertility. Talk to your care team if you are concerned about your fertility. What side effects may I notice from receiving this medication? Side effects that you should report to your care team as soon as possible: Allergic reactions--skin rash, itching, hives, swelling of the face, lips, tongue, or throat Infection--fever, chills, cough, sore throat, wounds that don't heal, pain or trouble when passing urine, general feeling of discomfort or being unwell Kidney injury--decrease in the amount of urine, swelling of the ankles, hands, or feet Liver injury--right upper belly pain, loss of appetite, nausea, light-colored stool, dark yellow or [name]  urine, yellowing skin or eyes, unusual weakness or fatigue Low red blood cell level--unusual weakness or fatigue, dizziness, headache, trouble breathing Tumor lysis syndrome (TLS)--nausea, vomiting, diarrhea, decrease in the amount of urine, dark urine, unusual weakness or fatigue, confusion, muscle pain or cramps, fast or irregular heartbeat, joint pain Unusual bruising or bleeding Side effects that usually do not require medical attention (report to your care team if they continue or are bothersome): Constipation Diarrhea Nausea Pain, redness, or irritation at injection site Vomiting This list may not describe all possible side effects. Call your doctor for medical advice about side effects. You may report side effects to FDA at 1-800-FDA-1088. Where should I keep my medication? This medication is given in a hospital or clinic. It will not be stored at home. NOTE: This sheet is a summary. It may not cover all possible information. If you have questions about this medicine, talk to your doctor, pharmacist, or health care provider.  2024 Elsevier/Gold Standard (2023-03-07 00:00:00)       To help prevent nausea and vomiting after your treatment, we encourage you to take your nausea medication as directed.  BELOW ARE SYMPTOMS THAT SHOULD BE REPORTED IMMEDIATELY: *FEVER GREATER THAN 100.4  F (38 C) OR HIGHER *CHILLS OR SWEATING *NAUSEA AND VOMITING THAT IS NOT CONTROLLED WITH YOUR NAUSEA MEDICATION *UNUSUAL SHORTNESS OF BREATH *UNUSUAL BRUISING OR BLEEDING *URINARY PROBLEMS (pain or burning when urinating, or frequent urination) *BOWEL PROBLEMS (unusual diarrhea, constipation, pain near the anus) TENDERNESS IN MOUTH AND THROAT WITH OR WITHOUT PRESENCE OF ULCERS (sore throat, sores in mouth, or a toothache) UNUSUAL RASH, SWELLING OR PAIN  UNUSUAL VAGINAL DISCHARGE OR ITCHING   Items with * indicate a potential emergency and should be followed up as soon as possible or go to the Emergency  Department if any problems should occur.  Please show the CHEMOTHERAPY ALERT CARD or IMMUNOTHERAPY ALERT CARD at check-in to the Emergency Department and triage nurse.  Should you have questions after your visit or need to cancel or reschedule your appointment, please contact [facility] [place] - A DEPT OF [facility] [phone]  and follow the prompts.  Office hours are 8:00 a.m. to 4:30 p.m. Monday - Friday. Please note that voicemails left after 4:00 p.m. may not be returned until the following business day.  We are closed weekends and major holidays. You have access to a nurse at all times for urgent questions. Please call the main number to the clinic [phone] and follow the prompts.  For any non-urgent questions, you may also contact your provider using MyChart. We now offer e-Visits for anyone 88 and older to request care online for non-urgent symptoms. For details visit [email or web address].   Also download the MyChart app! Go to the app store, search "MyChart", open the app, select [place], and log in with your MyChart username and password.

## 2024-03-06 NOTE — Progress Notes (Signed)
Patient presents today for chemotherapy infusion.  Patient is in satisfactory condition with no new complaints voiced.  Vital signs are stable.  Labs reviewed and all labs are within treatment parameters. We will proceed with treatment per MD orders.   Patient tolerated treatment well with no complaints voiced.  Patient left ambulatory in stable condition.  Vital signs stable at discharge.  Follow up as scheduled.

## 2024-03-07 ENCOUNTER — Encounter: Payer: Self-pay | Admitting: Oncology

## 2024-03-07 ENCOUNTER — Inpatient Hospital Stay

## 2024-03-07 VITALS — BP 97/61 | HR 75 | Temp 98.0°F | Resp 18

## 2024-03-07 DIAGNOSIS — C9310 Chronic myelomonocytic leukemia not having achieved remission: Secondary | ICD-10-CM | POA: Diagnosis not present

## 2024-03-07 DIAGNOSIS — Z95828 Presence of other vascular implants and grafts: Secondary | ICD-10-CM

## 2024-03-07 DIAGNOSIS — D696 Thrombocytopenia, unspecified: Secondary | ICD-10-CM | POA: Diagnosis not present

## 2024-03-07 DIAGNOSIS — Z79899 Other long term (current) drug therapy: Secondary | ICD-10-CM | POA: Diagnosis not present

## 2024-03-07 DIAGNOSIS — Z5111 Encounter for antineoplastic chemotherapy: Secondary | ICD-10-CM | POA: Diagnosis not present

## 2024-03-07 MED ORDER — SODIUM CHLORIDE 0.9 % IV SOLN: Freq: Once | INTRAVENOUS | Status: AC

## 2024-03-07 MED ORDER — PALONOSETRON HCL INJECTION 0.25 MG/5ML
0.2500 mg | Freq: Once | INTRAVENOUS | Status: AC
  Administered 2024-03-07: 0.25 mg via INTRAVENOUS
  Filled 2024-03-07: qty 5

## 2024-03-07 MED ORDER — SODIUM CHLORIDE 0.9 % IV SOLN
75.0000 mg/m2 | Freq: Once | INTRAVENOUS | Status: AC
  Administered 2024-03-07: 126 mg via INTRAVENOUS
  Filled 2024-03-07: qty 12.6

## 2024-03-07 NOTE — Progress Notes (Signed)
 Patient presents today for D3 C20 Vidaza . Blood pressure 98/59 on arrival. Patient asymptomatic. Patient denies any side effects related to treatment.    Treatment given today per MD orders. Tolerated infusion without adverse affects. Vital signs stable. No complaints at this time. Discharged from clinic ambulatory in stable condition. Alert and oriented x 3. F/U with [facility] as scheduled.

## 2024-03-07 NOTE — Patient Instructions (Signed)
 [facility] [place] - A DEPT OF [facility]  Discharge Instructions: Thank you for choosing [facility] to provide your oncology and hematology care.  If you have a lab appointment with the Cancer Center - please note that after April 8th, 2024, all labs will be drawn in the cancer center.  You do not have to check in or register with the main entrance as you have in the past but will complete your check-in in the cancer center.  Wear comfortable clothing and clothing appropriate for easy access to any Portacath or PICC line.   We strive to give you quality time with your provider. You may need to reschedule your appointment if you arrive late (15 or more minutes).  Arriving late affects you and other patients whose appointments are after yours.  Also, if you miss three or more appointments without notifying the office, you may be dismissed from the clinic at the provider's discretion.      For prescription refill requests, have your pharmacy contact our office and allow 72 hours for refills to be completed.    Today you received the following chemotherapy and/or immunotherapy agents Vidaza.  Azacitidine Injection What is this medication? AZACITIDINE (ay za SITE i deen) treats blood and bone marrow cancers. It works by slowing down the growth of cancer cells. This medicine may be used for other purposes; ask your health care provider or pharmacist if you have questions. COMMON BRAND NAME(S): Vidaza What should I tell my care team before I take this medication? They need to know if you have any of these conditions: Kidney disease Liver disease Low blood cell levels, such as low white cells, platelets, or red blood cells Low levels of albumin in the blood Low levels of bicarbonate in the blood An unusual or allergic reaction to azacitidine, mannitol, other medications, foods, dyes, or preservatives If you or your partner are pregnant or trying to get  pregnant Breast-feeding How should I use this medication? This medication is injected into a vein or under the skin. It is given by your care team in a hospital or clinic setting. Talk to your care team about the use of this medication in children. While it may be prescribed for children as young as 1 month for selected conditions, precautions do apply. Overdosage: If you think you have taken too much of this medicine contact a poison control center or emergency room at once. NOTE: This medicine is only for you. Do not share this medicine with others. What if I miss a dose? Keep appointments for follow-up doses. It is important not to miss your dose. Call your care team if you are unable to keep an appointment. What may interact with this medication? Interactions are not expected. This list may not describe all possible interactions. Give your health care provider a list of all the medicines, herbs, non-prescription drugs, or dietary supplements you use. Also tell them if you smoke, drink alcohol, or use illegal drugs. Some items may interact with your medicine. What should I watch for while using this medication? Your condition will be monitored carefully while you are receiving this medication. This medication may make you feel generally unwell. This is not uncommon as chemotherapy can affect healthy cells as well as cancer cells. Report any side effects. Continue your course of treatment even though you feel ill unless your care team tells you to stop. You may need blood work done while you are  taking this medication. Other product types may be available that contain the medication azacitidine. The injection and oral products should not be used in place of one another. Talk to your care team if you have questions. This medication can cause serious side effects. To reduce the risk, your care team may give you other medications to take before receiving this one. Be sure to follow the directions from  your care team. This medication may increase your risk of getting an infection. Call your care team for advice if you get a fever, chills, sore throat, or other symptoms of a cold or flu. Do not treat yourself. Try to avoid being around people who are sick. Avoid taking medications that contain aspirin, acetaminophen, ibuprofen, naproxen, or ketoprofen unless instructed by your care team. These medications may hide a fever. Be careful brushing or flossing your teeth or using a toothpick because you may get an infection or bleed more easily. If you have any dental work done, tell your dentist you are receiving this medication. Talk to your care team if you or your partner may be pregnant. Serious birth defects can occur if you take this medication during pregnancy and for 6 months after the last dose. You will need a negative pregnancy test before starting this medication. Contraception is recommended while taking this medication and for 6 months after the last dose. Your care team can help you find the option that works for you. If your partner can get pregnant, use a condom during sex while taking this medication and for 3 months after the last dose. Do not breastfeed while taking this medication and for 1 week after the last dose. This medication may cause infertility. Talk to your care team if you are concerned about your fertility. What side effects may I notice from receiving this medication? Side effects that you should report to your care team as soon as possible: Allergic reactions--skin rash, itching, hives, swelling of the face, lips, tongue, or throat Infection--fever, chills, cough, sore throat, wounds that don't heal, pain or trouble when passing urine, general feeling of discomfort or being unwell Kidney injury--decrease in the amount of urine, swelling of the ankles, hands, or feet Liver injury--right upper belly pain, loss of appetite, nausea, light-colored stool, dark yellow or [name]  urine, yellowing skin or eyes, unusual weakness or fatigue Low red blood cell level--unusual weakness or fatigue, dizziness, headache, trouble breathing Tumor lysis syndrome (TLS)--nausea, vomiting, diarrhea, decrease in the amount of urine, dark urine, unusual weakness or fatigue, confusion, muscle pain or cramps, fast or irregular heartbeat, joint pain Unusual bruising or bleeding Side effects that usually do not require medical attention (report to your care team if they continue or are bothersome): Constipation Diarrhea Nausea Pain, redness, or irritation at injection site Vomiting This list may not describe all possible side effects. Call your doctor for medical advice about side effects. You may report side effects to FDA at 1-800-FDA-1088. Where should I keep my medication? This medication is given in a hospital or clinic. It will not be stored at home. NOTE: This sheet is a summary. It may not cover all possible information. If you have questions about this medicine, talk to your doctor, pharmacist, or health care provider.  2024 Elsevier/Gold Standard (2023-03-07 00:00:00)       To help prevent nausea and vomiting after your treatment, we encourage you to take your nausea medication as directed.  BELOW ARE SYMPTOMS THAT SHOULD BE REPORTED IMMEDIATELY: *FEVER GREATER THAN 100.4  F (38 C) OR HIGHER *CHILLS OR SWEATING *NAUSEA AND VOMITING THAT IS NOT CONTROLLED WITH YOUR NAUSEA MEDICATION *UNUSUAL SHORTNESS OF BREATH *UNUSUAL BRUISING OR BLEEDING *URINARY PROBLEMS (pain or burning when urinating, or frequent urination) *BOWEL PROBLEMS (unusual diarrhea, constipation, pain near the anus) TENDERNESS IN MOUTH AND THROAT WITH OR WITHOUT PRESENCE OF ULCERS (sore throat, sores in mouth, or a toothache) UNUSUAL RASH, SWELLING OR PAIN  UNUSUAL VAGINAL DISCHARGE OR ITCHING   Items with * indicate a potential emergency and should be followed up as soon as possible or go to the Emergency  Department if any problems should occur.  Please show the CHEMOTHERAPY ALERT CARD or IMMUNOTHERAPY ALERT CARD at check-in to the Emergency Department and triage nurse.  Should you have questions after your visit or need to cancel or reschedule your appointment, please contact [facility] [place] - A DEPT OF [facility] [phone]  and follow the prompts.  Office hours are 8:00 a.m. to 4:30 p.m. Monday - Friday. Please note that voicemails left after 4:00 p.m. may not be returned until the following business day.  We are closed weekends and major holidays. You have access to a nurse at all times for urgent questions. Please call the main number to the clinic [phone] and follow the prompts.  For any non-urgent questions, you may also contact your provider using MyChart. We now offer e-Visits for anyone 88 and older to request care online for non-urgent symptoms. For details visit [email or web address].   Also download the MyChart app! Go to the app store, search "MyChart", open the app, select [place], and log in with your MyChart username and password.

## 2024-03-08 ENCOUNTER — Inpatient Hospital Stay

## 2024-03-08 VITALS — BP 112/81 | HR 80 | Temp 97.0°F | Resp 18

## 2024-03-08 DIAGNOSIS — D696 Thrombocytopenia, unspecified: Secondary | ICD-10-CM | POA: Diagnosis not present

## 2024-03-08 DIAGNOSIS — Z95828 Presence of other vascular implants and grafts: Secondary | ICD-10-CM

## 2024-03-08 DIAGNOSIS — C9310 Chronic myelomonocytic leukemia not having achieved remission: Secondary | ICD-10-CM

## 2024-03-08 DIAGNOSIS — Z79899 Other long term (current) drug therapy: Secondary | ICD-10-CM | POA: Diagnosis not present

## 2024-03-08 DIAGNOSIS — Z5111 Encounter for antineoplastic chemotherapy: Secondary | ICD-10-CM | POA: Diagnosis not present

## 2024-03-08 MED ORDER — SODIUM CHLORIDE 0.9 % IV SOLN: Freq: Once | INTRAVENOUS | Status: AC

## 2024-03-08 MED ORDER — SODIUM CHLORIDE 0.9 % IV SOLN
75.0000 mg/m2 | Freq: Once | INTRAVENOUS | Status: AC
  Administered 2024-03-08: 126 mg via INTRAVENOUS
  Filled 2024-03-08: qty 12.6

## 2024-03-08 NOTE — Progress Notes (Signed)
   03/08/24 1400  Spiritual Encounters  Type of Visit Initial  Care provided to: Patient  Referral source Other (comment) (Chaplain Making Rounds)  Reason for visit  (Introduction to Spiritual Care)  OnCall Visit No  Spiritual Framework  Presenting Themes Goals in life/care;Values and beliefs;Caregiving needs;Community and relationships  Community/Connection Family;Faith community;Friend(s)  Patient Stress Factors None identified  Family Stress Factors Not reviewed  Interventions  Spiritual Care Interventions Made Established relationship of care and support;Reflective listening;Narrative/life review;Explored values/beliefs/practices/strengths;Prayer  Intervention Outcomes  Outcomes Connection to spiritual care;Awareness of support  Spiritual Care Plan  Spiritual Care Issues Still Outstanding Chaplain will continue to follow   Reason for Visit: Chaplain making rounds on the floor visiting infusion Pts and I identified Pt as someone I had not connected with yet.  Description of Visit: Arriving in the room I found Laquandra in a recliner chair receiving her treatment, with no support person present. I introduced myself as the chaplain for the cancer center and offered a brief education on the role of a chaplain and the support we can offer to our patients, caregivers, and staff.    Noam immediately began to share her faith with me and how much God has been a support to her.  As I encouraged her to continue sharing, she shared with me a major development in her cancer journey.  She is about to embark on a treatment of cell replacement therapy which will require a month of hospitalization at [facility], and then 7 months of isolation at home.  She explains that if the procedure is successful, however, she will be cancer free.  Tulip understands the difficulties this journey will include yet she remains realistically enthusiastic about it.  She credits her faith as the factor that gives her  courage. Evolett asked me to pray for her and I complied.  I noted that I would try to reach out to her periodically and check in on her progress and she was pleased with this.  Plan of Care: I will check the Pt's appointments and make plans to reach out on a monthly basis to follow-up.   [name], MDiv  Chaplain, [facility] [email or web address] [phone]

## 2024-03-08 NOTE — Patient Instructions (Signed)
 [facility] [place] - A DEPT OF [facility]  Discharge Instructions: Thank you for choosing [facility] to provide your oncology and hematology care.  If you have a lab appointment with the Cancer Center - please note that after April 8th, 2024, all labs will be drawn in the cancer center.  You do not have to check in or register with the main entrance as you have in the past but will complete your check-in in the cancer center.  Wear comfortable clothing and clothing appropriate for easy access to any Portacath or PICC line.   We strive to give you quality time with your provider. You may need to reschedule your appointment if you arrive late (15 or more minutes).  Arriving late affects you and other patients whose appointments are after yours.  Also, if you miss three or more appointments without notifying the office, you may be dismissed from the clinic at the provider's discretion.      For prescription refill requests, have your pharmacy contact our office and allow 72 hours for refills to be completed.    Today you received the following chemotherapy and/or immunotherapy agents Vidaza.  Azacitidine Injection What is this medication? AZACITIDINE (ay za SITE i deen) treats blood and bone marrow cancers. It works by slowing down the growth of cancer cells. This medicine may be used for other purposes; ask your health care provider or pharmacist if you have questions. COMMON BRAND NAME(S): Vidaza What should I tell my care team before I take this medication? They need to know if you have any of these conditions: Kidney disease Liver disease Low blood cell levels, such as low white cells, platelets, or red blood cells Low levels of albumin in the blood Low levels of bicarbonate in the blood An unusual or allergic reaction to azacitidine, mannitol, other medications, foods, dyes, or preservatives If you or your partner are pregnant or trying to get  pregnant Breast-feeding How should I use this medication? This medication is injected into a vein or under the skin. It is given by your care team in a hospital or clinic setting. Talk to your care team about the use of this medication in children. While it may be prescribed for children as young as 1 month for selected conditions, precautions do apply. Overdosage: If you think you have taken too much of this medicine contact a poison control center or emergency room at once. NOTE: This medicine is only for you. Do not share this medicine with others. What if I miss a dose? Keep appointments for follow-up doses. It is important not to miss your dose. Call your care team if you are unable to keep an appointment. What may interact with this medication? Interactions are not expected. This list may not describe all possible interactions. Give your health care provider a list of all the medicines, herbs, non-prescription drugs, or dietary supplements you use. Also tell them if you smoke, drink alcohol, or use illegal drugs. Some items may interact with your medicine. What should I watch for while using this medication? Your condition will be monitored carefully while you are receiving this medication. This medication may make you feel generally unwell. This is not uncommon as chemotherapy can affect healthy cells as well as cancer cells. Report any side effects. Continue your course of treatment even though you feel ill unless your care team tells you to stop. You may need blood work done while you are  taking this medication. Other product types may be available that contain the medication azacitidine. The injection and oral products should not be used in place of one another. Talk to your care team if you have questions. This medication can cause serious side effects. To reduce the risk, your care team may give you other medications to take before receiving this one. Be sure to follow the directions from  your care team. This medication may increase your risk of getting an infection. Call your care team for advice if you get a fever, chills, sore throat, or other symptoms of a cold or flu. Do not treat yourself. Try to avoid being around people who are sick. Avoid taking medications that contain aspirin, acetaminophen, ibuprofen, naproxen, or ketoprofen unless instructed by your care team. These medications may hide a fever. Be careful brushing or flossing your teeth or using a toothpick because you may get an infection or bleed more easily. If you have any dental work done, tell your dentist you are receiving this medication. Talk to your care team if you or your partner may be pregnant. Serious birth defects can occur if you take this medication during pregnancy and for 6 months after the last dose. You will need a negative pregnancy test before starting this medication. Contraception is recommended while taking this medication and for 6 months after the last dose. Your care team can help you find the option that works for you. If your partner can get pregnant, use a condom during sex while taking this medication and for 3 months after the last dose. Do not breastfeed while taking this medication and for 1 week after the last dose. This medication may cause infertility. Talk to your care team if you are concerned about your fertility. What side effects may I notice from receiving this medication? Side effects that you should report to your care team as soon as possible: Allergic reactions--skin rash, itching, hives, swelling of the face, lips, tongue, or throat Infection--fever, chills, cough, sore throat, wounds that don't heal, pain or trouble when passing urine, general feeling of discomfort or being unwell Kidney injury--decrease in the amount of urine, swelling of the ankles, hands, or feet Liver injury--right upper belly pain, loss of appetite, nausea, light-colored stool, dark yellow or [name]  urine, yellowing skin or eyes, unusual weakness or fatigue Low red blood cell level--unusual weakness or fatigue, dizziness, headache, trouble breathing Tumor lysis syndrome (TLS)--nausea, vomiting, diarrhea, decrease in the amount of urine, dark urine, unusual weakness or fatigue, confusion, muscle pain or cramps, fast or irregular heartbeat, joint pain Unusual bruising or bleeding Side effects that usually do not require medical attention (report to your care team if they continue or are bothersome): Constipation Diarrhea Nausea Pain, redness, or irritation at injection site Vomiting This list may not describe all possible side effects. Call your doctor for medical advice about side effects. You may report side effects to FDA at 1-800-FDA-1088. Where should I keep my medication? This medication is given in a hospital or clinic. It will not be stored at home. NOTE: This sheet is a summary. It may not cover all possible information. If you have questions about this medicine, talk to your doctor, pharmacist, or health care provider.  2024 Elsevier/Gold Standard (2023-03-07 00:00:00)       To help prevent nausea and vomiting after your treatment, we encourage you to take your nausea medication as directed.  BELOW ARE SYMPTOMS THAT SHOULD BE REPORTED IMMEDIATELY: *FEVER GREATER THAN 100.4  F (38 C) OR HIGHER *CHILLS OR SWEATING *NAUSEA AND VOMITING THAT IS NOT CONTROLLED WITH YOUR NAUSEA MEDICATION *UNUSUAL SHORTNESS OF BREATH *UNUSUAL BRUISING OR BLEEDING *URINARY PROBLEMS (pain or burning when urinating, or frequent urination) *BOWEL PROBLEMS (unusual diarrhea, constipation, pain near the anus) TENDERNESS IN MOUTH AND THROAT WITH OR WITHOUT PRESENCE OF ULCERS (sore throat, sores in mouth, or a toothache) UNUSUAL RASH, SWELLING OR PAIN  UNUSUAL VAGINAL DISCHARGE OR ITCHING   Items with * indicate a potential emergency and should be followed up as soon as possible or go to the Emergency  Department if any problems should occur.  Please show the CHEMOTHERAPY ALERT CARD or IMMUNOTHERAPY ALERT CARD at check-in to the Emergency Department and triage nurse.  Should you have questions after your visit or need to cancel or reschedule your appointment, please contact [facility] [place] - A DEPT OF [facility] [phone]  and follow the prompts.  Office hours are 8:00 a.m. to 4:30 p.m. Monday - Friday. Please note that voicemails left after 4:00 p.m. may not be returned until the following business day.  We are closed weekends and major holidays. You have access to a nurse at all times for urgent questions. Please call the main number to the clinic [phone] and follow the prompts.  For any non-urgent questions, you may also contact your provider using MyChart. We now offer e-Visits for anyone 88 and older to request care online for non-urgent symptoms. For details visit [email or web address].   Also download the MyChart app! Go to the app store, search "MyChart", open the app, select [place], and log in with your MyChart username and password.

## 2024-03-08 NOTE — Progress Notes (Signed)
 Patient presents today for chemotherapy infusion.  Patient is in satisfactory condition with no new complaints voiced.  Vital signs are stable.  Labs from 03/05/24 reviewed.  We will proceed with treatment per MD orders.    Patient tolerated treatment well with no complaints voiced.  Patient left ambulatory in stable condition.  Vital signs stable at discharge.  Follow up as scheduled.

## 2024-03-09 ENCOUNTER — Inpatient Hospital Stay

## 2024-03-09 VITALS — BP 106/71 | HR 70 | Temp 97.0°F | Resp 18

## 2024-03-09 DIAGNOSIS — C9310 Chronic myelomonocytic leukemia not having achieved remission: Secondary | ICD-10-CM | POA: Diagnosis not present

## 2024-03-09 DIAGNOSIS — Z95828 Presence of other vascular implants and grafts: Secondary | ICD-10-CM

## 2024-03-09 DIAGNOSIS — D696 Thrombocytopenia, unspecified: Secondary | ICD-10-CM | POA: Diagnosis not present

## 2024-03-09 DIAGNOSIS — Z5111 Encounter for antineoplastic chemotherapy: Secondary | ICD-10-CM | POA: Diagnosis not present

## 2024-03-09 DIAGNOSIS — Z79899 Other long term (current) drug therapy: Secondary | ICD-10-CM | POA: Diagnosis not present

## 2024-03-09 MED ORDER — SODIUM CHLORIDE 0.9 % IV SOLN: Freq: Once | INTRAVENOUS | Status: AC

## 2024-03-09 MED ORDER — PALONOSETRON HCL INJECTION 0.25 MG/5ML
0.2500 mg | Freq: Once | INTRAVENOUS | Status: AC
  Administered 2024-03-09: 0.25 mg via INTRAVENOUS
  Filled 2024-03-09: qty 5

## 2024-03-09 MED ORDER — SODIUM CHLORIDE 0.9 % IV SOLN
75.0000 mg/m2 | Freq: Once | INTRAVENOUS | Status: AC
  Administered 2024-03-09: 126 mg via INTRAVENOUS
  Filled 2024-03-09: qty 12.6

## 2024-03-09 NOTE — Patient Instructions (Signed)
 [facility] [place] - A DEPT OF [facility]  Discharge Instructions: Thank you for choosing [facility] to provide your oncology and hematology care.  If you have a lab appointment with the Cancer Center - please note that after April 8th, 2024, all labs will be drawn in the cancer center.  You do not have to check in or register with the main entrance as you have in the past but will complete your check-in in the cancer center.  Wear comfortable clothing and clothing appropriate for easy access to any Portacath or PICC line.   We strive to give you quality time with your provider. You may need to reschedule your appointment if you arrive late (15 or more minutes).  Arriving late affects you and other patients whose appointments are after yours.  Also, if you miss three or more appointments without notifying the office, you may be dismissed from the clinic at the provider's discretion.      For prescription refill requests, have your pharmacy contact our office and allow 72 hours for refills to be completed.    Today you received the following chemotherapy and/or immunotherapy agents Vidaza , return as scheduled.   To help prevent nausea and vomiting after your treatment, we encourage you to take your nausea medication as directed.  BELOW ARE SYMPTOMS THAT SHOULD BE REPORTED IMMEDIATELY: *FEVER GREATER THAN 100.4 F (38 C) OR HIGHER *CHILLS OR SWEATING *NAUSEA AND VOMITING THAT IS NOT CONTROLLED WITH YOUR NAUSEA MEDICATION *UNUSUAL SHORTNESS OF BREATH *UNUSUAL BRUISING OR BLEEDING *URINARY PROBLEMS (pain or burning when urinating, or frequent urination) *BOWEL PROBLEMS (unusual diarrhea, constipation, pain near the anus) TENDERNESS IN MOUTH AND THROAT WITH OR WITHOUT PRESENCE OF ULCERS (sore throat, sores in mouth, or a toothache) UNUSUAL RASH, SWELLING OR PAIN  UNUSUAL VAGINAL DISCHARGE OR ITCHING   Items with * indicate a potential emergency and  should be followed up as soon as possible or go to the Emergency Department if any problems should occur.  Please show the CHEMOTHERAPY ALERT CARD or IMMUNOTHERAPY ALERT CARD at check-in to the Emergency Department and triage nurse.  Should you have questions after your visit or need to cancel or reschedule your appointment, please contact [facility] [place] - A DEPT OF [facility] [phone]  and follow the prompts.  Office hours are 8:00 a.m. to 4:30 p.m. Monday - Friday. Please note that voicemails left after 4:00 p.m. may not be returned until the following business day.  We are closed weekends and major holidays. You have access to a nurse at all times for urgent questions. Please call the main number to the clinic [phone] and follow the prompts.  For any non-urgent questions, you may also contact your provider using MyChart. We now offer e-Visits for anyone 45 and older to request care online for non-urgent symptoms. For details visit [email or web address].   Also download the MyChart app! Go to the app store, search MyChart, open the app, select [facility], and log in with your MyChart username and password.

## 2024-03-09 NOTE — Progress Notes (Signed)
Patient tolerated chemotherapy with no complaints voiced. Side effects with management reviewed understanding verbalized. Port site clean and dry with no bruising or swelling noted at site. Good blood return noted before and after administration of chemotherapy. Band aid applied. Patient left in satisfactory condition with VSS and no s/s of distress noted. 

## 2024-03-12 ENCOUNTER — Inpatient Hospital Stay

## 2024-03-12 DIAGNOSIS — C9310 Chronic myelomonocytic leukemia not having achieved remission: Secondary | ICD-10-CM | POA: Diagnosis not present

## 2024-03-12 DIAGNOSIS — Z7682 Awaiting organ transplant status: Secondary | ICD-10-CM | POA: Diagnosis not present

## 2024-03-12 DIAGNOSIS — R4189 Other symptoms and signs involving cognitive functions and awareness: Secondary | ICD-10-CM | POA: Diagnosis not present

## 2024-03-12 DIAGNOSIS — I351 Nonrheumatic aortic (valve) insufficiency: Secondary | ICD-10-CM | POA: Diagnosis not present

## 2024-03-12 DIAGNOSIS — J45909 Unspecified asthma, uncomplicated: Secondary | ICD-10-CM | POA: Diagnosis not present

## 2024-03-12 DIAGNOSIS — Z01818 Encounter for other preprocedural examination: Secondary | ICD-10-CM | POA: Diagnosis not present

## 2024-03-13 ENCOUNTER — Inpatient Hospital Stay

## 2024-03-13 ENCOUNTER — Encounter: Payer: Self-pay | Admitting: Oncology

## 2024-03-13 VITALS — BP 107/69 | HR 70 | Temp 96.9°F | Resp 19

## 2024-03-13 DIAGNOSIS — D696 Thrombocytopenia, unspecified: Secondary | ICD-10-CM | POA: Diagnosis not present

## 2024-03-13 DIAGNOSIS — C9310 Chronic myelomonocytic leukemia not having achieved remission: Secondary | ICD-10-CM | POA: Diagnosis not present

## 2024-03-13 DIAGNOSIS — Z79899 Other long term (current) drug therapy: Secondary | ICD-10-CM | POA: Diagnosis not present

## 2024-03-13 DIAGNOSIS — Z136 Encounter for screening for cardiovascular disorders: Secondary | ICD-10-CM | POA: Diagnosis not present

## 2024-03-13 DIAGNOSIS — Z5111 Encounter for antineoplastic chemotherapy: Secondary | ICD-10-CM | POA: Diagnosis not present

## 2024-03-13 DIAGNOSIS — Z95828 Presence of other vascular implants and grafts: Secondary | ICD-10-CM

## 2024-03-13 LAB — CBC WITH DIFFERENTIAL/PLATELET
Abs Granulocyte: 1.9 K/uL (ref 1.5–6.5)
Abs Immature Granulocytes: 0.15 K/uL — ABNORMAL HIGH (ref 0.00–0.07)
Basophils Absolute: 0 K/uL (ref 0.0–0.1)
Basophils Relative: 1 %
Eosinophils Absolute: 0.1 K/uL (ref 0.0–0.5)
Eosinophils Relative: 3 %
HCT: 26.9 % — ABNORMAL LOW (ref 36.0–46.0)
Hemoglobin: 8.2 g/dL — ABNORMAL LOW (ref 12.0–15.0)
Immature Granulocytes: 5 %
Lymphocytes Relative: 21 %
Lymphs Abs: 0.6 K/uL — ABNORMAL LOW (ref 0.7–4.0)
MCH: 30.8 pg (ref 26.0–34.0)
MCHC: 30.5 g/dL (ref 30.0–36.0)
MCV: 101.1 fL — ABNORMAL HIGH (ref 80.0–100.0)
Monocytes Absolute: 0.2 K/uL (ref 0.1–1.0)
Monocytes Relative: 6 %
Neutro Abs: 1.9 K/uL (ref 1.7–7.7)
Neutrophils Relative %: 64 %
Platelets: 91 K/uL — ABNORMAL LOW (ref 150–400)
RBC: 2.66 MIL/uL — ABNORMAL LOW (ref 3.87–5.11)
RDW: 19.2 % — ABNORMAL HIGH (ref 11.5–15.5)
WBC: 3 K/uL — ABNORMAL LOW (ref 4.0–10.5)
nRBC: 2.7 % — ABNORMAL HIGH (ref 0.0–0.2)

## 2024-03-13 LAB — COMPREHENSIVE METABOLIC PANEL WITH GFR
ALT: 15 U/L (ref 0–44)
AST: 23 U/L (ref 15–41)
Albumin: 4 g/dL (ref 3.5–5.0)
Alkaline Phosphatase: 60 U/L (ref 38–126)
Anion gap: 11 (ref 5–15)
BUN: 13 mg/dL (ref 8–23)
CO2: 25 mmol/L (ref 22–32)
Calcium: 9.2 mg/dL (ref 8.9–10.3)
Chloride: 101 mmol/L (ref 98–111)
Creatinine, Ser: 0.88 mg/dL (ref 0.44–1.00)
GFR, Estimated: >60 mL/min (ref >60)
Glucose, Bld: 110 mg/dL — ABNORMAL HIGH (ref 70–99)
Potassium: 3.9 mmol/L (ref 3.5–5.1)
Sodium: 137 mmol/L (ref 135–145)
Total Bilirubin: 1.4 mg/dL — ABNORMAL HIGH (ref 0.0–1.2)
Total Protein: 7 g/dL (ref 6.5–8.1)

## 2024-03-13 LAB — MAGNESIUM: Magnesium: 2.2 mg/dL (ref 1.7–2.4)

## 2024-03-13 MED ORDER — SODIUM CHLORIDE 0.9 % IV SOLN
75.0000 mg/m2 | Freq: Once | INTRAVENOUS | Status: AC
  Administered 2024-03-13: 126 mg via INTRAVENOUS
  Filled 2024-03-13: qty 12.6

## 2024-03-13 MED ORDER — SODIUM CHLORIDE 0.9 % IV SOLN: Freq: Once | INTRAVENOUS | Status: AC

## 2024-03-13 MED ORDER — PALONOSETRON HCL INJECTION 0.25 MG/5ML
0.2500 mg | Freq: Once | INTRAVENOUS | Status: AC
  Administered 2024-03-13: 0.25 mg via INTRAVENOUS
  Filled 2024-03-13: qty 5

## 2024-03-13 NOTE — Patient Instructions (Signed)
 [facility] [place] - A DEPT OF [facility]  Discharge Instructions: Thank you for choosing [facility] to provide your oncology and hematology care.  If you have a lab appointment with the Cancer Center - please note that after April 8th, 2024, all labs will be drawn in the cancer center.  You do not have to check in or register with the main entrance as you have in the past but will complete your check-in in the cancer center.  Wear comfortable clothing and clothing appropriate for easy access to any Portacath or PICC line.   We strive to give you quality time with your provider. You may need to reschedule your appointment if you arrive late (15 or more minutes).  Arriving late affects you and other patients whose appointments are after yours.  Also, if you miss three or more appointments without notifying the office, you may be dismissed from the clinic at the provider's discretion.      For prescription refill requests, have your pharmacy contact our office and allow 72 hours for refills to be completed.    Today you received the following chemotherapy and/or immunotherapy agents Vidaza.  Azacitidine Injection What is this medication? AZACITIDINE (ay za SITE i deen) treats blood and bone marrow cancers. It works by slowing down the growth of cancer cells. This medicine may be used for other purposes; ask your health care provider or pharmacist if you have questions. COMMON BRAND NAME(S): Vidaza What should I tell my care team before I take this medication? They need to know if you have any of these conditions: Kidney disease Liver disease Low blood cell levels, such as low white cells, platelets, or red blood cells Low levels of albumin in the blood Low levels of bicarbonate in the blood An unusual or allergic reaction to azacitidine, mannitol, other medications, foods, dyes, or preservatives If you or your partner are pregnant or trying to get  pregnant Breast-feeding How should I use this medication? This medication is injected into a vein or under the skin. It is given by your care team in a hospital or clinic setting. Talk to your care team about the use of this medication in children. While it may be prescribed for children as young as 1 month for selected conditions, precautions do apply. Overdosage: If you think you have taken too much of this medicine contact a poison control center or emergency room at once. NOTE: This medicine is only for you. Do not share this medicine with others. What if I miss a dose? Keep appointments for follow-up doses. It is important not to miss your dose. Call your care team if you are unable to keep an appointment. What may interact with this medication? Interactions are not expected. This list may not describe all possible interactions. Give your health care provider a list of all the medicines, herbs, non-prescription drugs, or dietary supplements you use. Also tell them if you smoke, drink alcohol, or use illegal drugs. Some items may interact with your medicine. What should I watch for while using this medication? Your condition will be monitored carefully while you are receiving this medication. This medication may make you feel generally unwell. This is not uncommon as chemotherapy can affect healthy cells as well as cancer cells. Report any side effects. Continue your course of treatment even though you feel ill unless your care team tells you to stop. You may need blood work done while you are  taking this medication. Other product types may be available that contain the medication azacitidine. The injection and oral products should not be used in place of one another. Talk to your care team if you have questions. This medication can cause serious side effects. To reduce the risk, your care team may give you other medications to take before receiving this one. Be sure to follow the directions from  your care team. This medication may increase your risk of getting an infection. Call your care team for advice if you get a fever, chills, sore throat, or other symptoms of a cold or flu. Do not treat yourself. Try to avoid being around people who are sick. Avoid taking medications that contain aspirin, acetaminophen, ibuprofen, naproxen, or ketoprofen unless instructed by your care team. These medications may hide a fever. Be careful brushing or flossing your teeth or using a toothpick because you may get an infection or bleed more easily. If you have any dental work done, tell your dentist you are receiving this medication. Talk to your care team if you or your partner may be pregnant. Serious birth defects can occur if you take this medication during pregnancy and for 6 months after the last dose. You will need a negative pregnancy test before starting this medication. Contraception is recommended while taking this medication and for 6 months after the last dose. Your care team can help you find the option that works for you. If your partner can get pregnant, use a condom during sex while taking this medication and for 3 months after the last dose. Do not breastfeed while taking this medication and for 1 week after the last dose. This medication may cause infertility. Talk to your care team if you are concerned about your fertility. What side effects may I notice from receiving this medication? Side effects that you should report to your care team as soon as possible: Allergic reactions--skin rash, itching, hives, swelling of the face, lips, tongue, or throat Infection--fever, chills, cough, sore throat, wounds that don't heal, pain or trouble when passing urine, general feeling of discomfort or being unwell Kidney injury--decrease in the amount of urine, swelling of the ankles, hands, or feet Liver injury--right upper belly pain, loss of appetite, nausea, light-colored stool, dark yellow or [name]  urine, yellowing skin or eyes, unusual weakness or fatigue Low red blood cell level--unusual weakness or fatigue, dizziness, headache, trouble breathing Tumor lysis syndrome (TLS)--nausea, vomiting, diarrhea, decrease in the amount of urine, dark urine, unusual weakness or fatigue, confusion, muscle pain or cramps, fast or irregular heartbeat, joint pain Unusual bruising or bleeding Side effects that usually do not require medical attention (report to your care team if they continue or are bothersome): Constipation Diarrhea Nausea Pain, redness, or irritation at injection site Vomiting This list may not describe all possible side effects. Call your doctor for medical advice about side effects. You may report side effects to FDA at 1-800-FDA-1088. Where should I keep my medication? This medication is given in a hospital or clinic. It will not be stored at home. NOTE: This sheet is a summary. It may not cover all possible information. If you have questions about this medicine, talk to your doctor, pharmacist, or health care provider.  2024 Elsevier/Gold Standard (2023-03-07 00:00:00)       To help prevent nausea and vomiting after your treatment, we encourage you to take your nausea medication as directed.  BELOW ARE SYMPTOMS THAT SHOULD BE REPORTED IMMEDIATELY: *FEVER GREATER THAN 100.4  F (38 C) OR HIGHER *CHILLS OR SWEATING *NAUSEA AND VOMITING THAT IS NOT CONTROLLED WITH YOUR NAUSEA MEDICATION *UNUSUAL SHORTNESS OF BREATH *UNUSUAL BRUISING OR BLEEDING *URINARY PROBLEMS (pain or burning when urinating, or frequent urination) *BOWEL PROBLEMS (unusual diarrhea, constipation, pain near the anus) TENDERNESS IN MOUTH AND THROAT WITH OR WITHOUT PRESENCE OF ULCERS (sore throat, sores in mouth, or a toothache) UNUSUAL RASH, SWELLING OR PAIN  UNUSUAL VAGINAL DISCHARGE OR ITCHING   Items with * indicate a potential emergency and should be followed up as soon as possible or go to the Emergency  Department if any problems should occur.  Please show the CHEMOTHERAPY ALERT CARD or IMMUNOTHERAPY ALERT CARD at check-in to the Emergency Department and triage nurse.  Should you have questions after your visit or need to cancel or reschedule your appointment, please contact [facility] [place] - A DEPT OF [facility] [phone]  and follow the prompts.  Office hours are 8:00 a.m. to 4:30 p.m. Monday - Friday. Please note that voicemails left after 4:00 p.m. may not be returned until the following business day.  We are closed weekends and major holidays. You have access to a nurse at all times for urgent questions. Please call the main number to the clinic [phone] and follow the prompts.  For any non-urgent questions, you may also contact your provider using MyChart. We now offer e-Visits for anyone 88 and older to request care online for non-urgent symptoms. For details visit [email or web address].   Also download the MyChart app! Go to the app store, search "MyChart", open the app, select [place], and log in with your MyChart username and password.

## 2024-03-13 NOTE — Progress Notes (Signed)
Patient presents today for chemotherapy infusion.  Patient is in satisfactory condition with no new complaints voiced.  Vital signs are stable.  We will proceed with treatment per MD orders.  Patient tolerated treatment well with no complaints voiced.  Patient left ambulatory in stable condition.  Vital signs stable at discharge.  Follow up as scheduled.    

## 2024-03-13 NOTE — Progress Notes (Signed)
 Patient presents today for Vidaza  infusion.  Patient is in satisfactory condition with no new complaints voiced.  Vital signs are stable.  We will proceed with treatment per MD orders.

## 2024-03-13 NOTE — Patient Instructions (Signed)
 [facility] [place] - [facility]  Discharge Instructions: Thank you for choosing [facility] to provide your oncology and hematology care.  If you have a lab appointment with the Cancer Center - please note that after April 8th, 2024, all labs will be drawn in the cancer center.  You do not have to check in or register with the main entrance as you have in the past but will complete your check-in in the cancer center.  Wear comfortable clothing and clothing appropriate for easy access to any Portacath or PICC line.   We strive to give you quality time with your provider. You may need to reschedule your appointment if you arrive late (15 or more minutes).  Arriving late affects you and other patients whose appointments are after yours.  Also, if you miss three or more appointments without notifying the office, you may be dismissed from the clinic at the provider's discretion.      For prescription refill requests, have your pharmacy contact our office and allow 72 hours for refills to be completed.    Today you received the following chemotherapy and/or immunotherapy agents Vidaza.  Azacitidine Injection What is this medication? AZACITIDINE (ay za SITE i deen) treats blood and bone marrow cancers. It works by slowing down the growth of cancer cells. This medicine may be used for other purposes; ask your health care provider or pharmacist if you have questions. COMMON BRAND NAME(S): Vidaza What should I tell my care team before I take this medication? They need to know if you have any of these conditions: Kidney disease Liver disease Low blood cell levels, such as low white cells, platelets, or red blood cells Low levels of albumin in the blood Low levels of bicarbonate in the blood An unusual or allergic reaction to azacitidine, mannitol, other medications, foods, dyes, or preservatives If you or your partner are pregnant or trying to get  pregnant Breast-feeding How should I use this medication? This medication is injected into a vein or under the skin. It is given by your care team in a hospital or clinic setting. Talk to your care team about the use of this medication in children. While it may be prescribed for children as young as 1 month for selected conditions, precautions do apply. Overdosage: If you think you have taken too much of this medicine contact a poison control center or emergency room at once. NOTE: This medicine is only for you. Do not share this medicine with others. What if I miss a dose? Keep appointments for follow-up doses. It is important not to miss your dose. Call your care team if you are unable to keep an appointment. What may interact with this medication? Interactions are not expected. This list may not describe all possible interactions. Give your health care provider a list of all the medicines, herbs, non-prescription drugs, or dietary supplements you use. Also tell them if you smoke, drink alcohol, or use illegal drugs. Some items may interact with your medicine. What should I watch for while using this medication? Your condition will be monitored carefully while you are receiving this medication. This medication may make you feel generally unwell. This is not uncommon as chemotherapy can affect healthy cells as well as cancer cells. Report any side effects. Continue your course of treatment even though you feel ill unless your care team tells you to stop. You may need blood work done while you are  taking this medication. Other product types may be available that contain the medication azacitidine. The injection and oral products should not be used in place of one another. Talk to your care team if you have questions. This medication can cause serious side effects. To reduce the risk, your care team may give you other medications to take before receiving this one. Be sure to follow the directions from  your care team. This medication may increase your risk of getting an infection. Call your care team for advice if you get a fever, chills, sore throat, or other symptoms of a cold or flu. Do not treat yourself. Try to avoid being around people who are sick. Avoid taking medications that contain aspirin, acetaminophen, ibuprofen, naproxen, or ketoprofen unless instructed by your care team. These medications may hide a fever. Be careful brushing or flossing your teeth or using a toothpick because you may get an infection or bleed more easily. If you have any dental work done, tell your dentist you are receiving this medication. Talk to your care team if you or your partner may be pregnant. Serious birth defects can occur if you take this medication during pregnancy and for 6 months after the last dose. You will need a negative pregnancy test before starting this medication. Contraception is recommended while taking this medication and for 6 months after the last dose. Your care team can help you find the option that works for you. If your partner can get pregnant, use a condom during sex while taking this medication and for 3 months after the last dose. Do not breastfeed while taking this medication and for 1 week after the last dose. This medication may cause infertility. Talk to your care team if you are concerned about your fertility. What side effects may I notice from receiving this medication? Side effects that you should report to your care team as soon as possible: Allergic reactions--skin rash, itching, hives, swelling of the face, lips, tongue, or throat Infection--fever, chills, cough, sore throat, wounds that don't heal, pain or trouble when passing urine, general feeling of discomfort or being unwell Kidney injury--decrease in the amount of urine, swelling of the ankles, hands, or feet Liver injury--right upper belly pain, loss of appetite, nausea, light-colored stool, dark yellow or Iyla Balzarini  urine, yellowing skin or eyes, unusual weakness or fatigue Low red blood cell level--unusual weakness or fatigue, dizziness, headache, trouble breathing Tumor lysis syndrome (TLS)--nausea, vomiting, diarrhea, decrease in the amount of urine, dark urine, unusual weakness or fatigue, confusion, muscle pain or cramps, fast or irregular heartbeat, joint pain Unusual bruising or bleeding Side effects that usually do not require medical attention (report to your care team if they continue or are bothersome): Constipation Diarrhea Nausea Pain, redness, or irritation at injection site Vomiting This list may not describe all possible side effects. Call your doctor for medical advice about side effects. You may report side effects to FDA at 1-800-FDA-1088. Where should I keep my medication? This medication is given in a hospital or clinic. It will not be stored at home. NOTE: This sheet is a summary. It may not cover all possible information. If you have questions about this medicine, talk to your doctor, pharmacist, or health care provider.  2024 Elsevier/Gold Standard (2023-03-07 00:00:00)       To help prevent nausea and vomiting after your treatment, we encourage you to take your nausea medication as directed.  BELOW ARE SYMPTOMS THAT SHOULD BE REPORTED IMMEDIATELY: *FEVER GREATER THAN 100.4  F (38 C) OR HIGHER *CHILLS OR SWEATING *NAUSEA AND VOMITING THAT IS NOT CONTROLLED WITH YOUR NAUSEA MEDICATION *UNUSUAL SHORTNESS OF BREATH *UNUSUAL BRUISING OR BLEEDING *URINARY PROBLEMS (pain or burning when urinating, or frequent urination) *BOWEL PROBLEMS (unusual diarrhea, constipation, pain near the anus) TENDERNESS IN MOUTH AND THROAT WITH OR WITHOUT PRESENCE OF ULCERS (sore throat, sores in mouth, or a toothache) UNUSUAL RASH, SWELLING OR PAIN  UNUSUAL VAGINAL DISCHARGE OR ITCHING   Items with * indicate a potential emergency and should be followed up as soon as possible or go to the Emergency  Department if any problems should occur.  Please show the CHEMOTHERAPY ALERT CARD or IMMUNOTHERAPY ALERT CARD at check-in to the Emergency Department and triage nurse.  Should you have questions after your visit or need to cancel or reschedule your appointment, please contact [facility] CANCER CTR [place] - A DEPT OF Eligha Bridegroom [facility] [phone]  and follow the prompts.  Office hours are 8:00 a.m. to 4:30 p.m. Monday - Friday. Please note that voicemails left after 4:00 p.m. may not be returned until the following business day.  We are closed weekends and major holidays. You have access to a nurse at all times for urgent questions. Please call the main number to the clinic [phone] and follow the prompts.  For any non-urgent questions, you may also contact your provider using MyChart. We now offer e-Visits for anyone 88 and older to request care online for non-urgent symptoms. For details visit [email or web address].   Also download the MyChart app! Go to the app store, search "MyChart", open the app, select [place], and log in with your MyChart username and password.

## 2024-03-14 ENCOUNTER — Encounter: Payer: Self-pay | Admitting: Oncology

## 2024-03-14 ENCOUNTER — Inpatient Hospital Stay

## 2024-03-14 ENCOUNTER — Ambulatory Visit (HOSPITAL_COMMUNITY)

## 2024-03-14 VITALS — BP 100/64 | HR 68 | Temp 98.2°F | Resp 18

## 2024-03-14 DIAGNOSIS — Z5111 Encounter for antineoplastic chemotherapy: Secondary | ICD-10-CM | POA: Diagnosis not present

## 2024-03-14 DIAGNOSIS — C9310 Chronic myelomonocytic leukemia not having achieved remission: Secondary | ICD-10-CM | POA: Diagnosis not present

## 2024-03-14 DIAGNOSIS — Z79899 Other long term (current) drug therapy: Secondary | ICD-10-CM | POA: Diagnosis not present

## 2024-03-14 DIAGNOSIS — D696 Thrombocytopenia, unspecified: Secondary | ICD-10-CM | POA: Diagnosis not present

## 2024-03-14 DIAGNOSIS — Z95828 Presence of other vascular implants and grafts: Secondary | ICD-10-CM

## 2024-03-14 MED ORDER — SODIUM CHLORIDE 0.9 % IV SOLN
75.0000 mg/m2 | Freq: Once | INTRAVENOUS | Status: AC
  Administered 2024-03-14: 126 mg via INTRAVENOUS
  Filled 2024-03-14: qty 12.6

## 2024-03-14 MED ORDER — SODIUM CHLORIDE 0.9 % IV SOLN: Freq: Once | INTRAVENOUS | Status: AC

## 2024-03-14 NOTE — Progress Notes (Signed)
Patient tolerated chemotherapy with no complaints voiced. Side effects with management reviewed understanding verbalized. Port site clean and dry with no bruising or swelling noted at site. Good blood return noted before and after administration of chemotherapy. Band aid applied. Patient left in satisfactory condition with VSS and no s/s of distress noted. 

## 2024-03-14 NOTE — Patient Instructions (Signed)
 [facility] [place] - A DEPT OF [facility]  Discharge Instructions: Thank you for choosing [facility] to provide your oncology and hematology care.  If you have a lab appointment with the Cancer Center - please note that after April 8th, 2024, all labs will be drawn in the cancer center.  You do not have to check in or register with the main entrance as you have in the past but will complete your check-in in the cancer center.  Wear comfortable clothing and clothing appropriate for easy access to any Portacath or PICC line.   We strive to give you quality time with your provider. You may need to reschedule your appointment if you arrive late (15 or more minutes).  Arriving late affects you and other patients whose appointments are after yours.  Also, if you miss three or more appointments without notifying the office, you may be dismissed from the clinic at the provider's discretion.      For prescription refill requests, have your pharmacy contact our office and allow 72 hours for refills to be completed.    Today you received the following chemotherapy and/or immunotherapy agents Vidaza , return as scheduled.   To help prevent nausea and vomiting after your treatment, we encourage you to take your nausea medication as directed.  BELOW ARE SYMPTOMS THAT SHOULD BE REPORTED IMMEDIATELY: *FEVER GREATER THAN 100.4 F (38 C) OR HIGHER *CHILLS OR SWEATING *NAUSEA AND VOMITING THAT IS NOT CONTROLLED WITH YOUR NAUSEA MEDICATION *UNUSUAL SHORTNESS OF BREATH *UNUSUAL BRUISING OR BLEEDING *URINARY PROBLEMS (pain or burning when urinating, or frequent urination) *BOWEL PROBLEMS (unusual diarrhea, constipation, pain near the anus) TENDERNESS IN MOUTH AND THROAT WITH OR WITHOUT PRESENCE OF ULCERS (sore throat, sores in mouth, or a toothache) UNUSUAL RASH, SWELLING OR PAIN  UNUSUAL VAGINAL DISCHARGE OR ITCHING   Items with * indicate a potential emergency and  should be followed up as soon as possible or go to the Emergency Department if any problems should occur.  Please show the CHEMOTHERAPY ALERT CARD or IMMUNOTHERAPY ALERT CARD at check-in to the Emergency Department and triage nurse.  Should you have questions after your visit or need to cancel or reschedule your appointment, please contact [facility] [place] - A DEPT OF [facility] [phone]  and follow the prompts.  Office hours are 8:00 a.m. to 4:30 p.m. Monday - Friday. Please note that voicemails left after 4:00 p.m. may not be returned until the following business day.  We are closed weekends and major holidays. You have access to a nurse at all times for urgent questions. Please call the main number to the clinic [phone] and follow the prompts.  For any non-urgent questions, you may also contact your provider using MyChart. We now offer e-Visits for anyone 45 and older to request care online for non-urgent symptoms. For details visit [email or web address].   Also download the MyChart app! Go to the app store, search MyChart, open the app, select [facility], and log in with your MyChart username and password.

## 2024-03-14 NOTE — Progress Notes (Signed)
   03/14/24 1400  Spiritual Encounters  Type of Visit Follow up  Care provided to: Patient  Referral source Chaplain assessment (Follow-up from previous visit)  Reason for visit Routine spiritual support  OnCall Visit No   Chaplain made a telephone call to Pt, [name] to follow-up with her.  She had mentioned in our previous visit that she had some important tests scheduled for this past Monday. She had been slightly anxious and some excited about these tests as there was a lot depending upon them.  I was calling to check in and see how that experience was.  [name] reported that the tests had gone well and she was pleased with the results and her plans of future treatments are still on track.  [name], MDiv  Chaplain, [facility] [email or web address] [phone]

## 2024-03-15 ENCOUNTER — Other Ambulatory Visit: Payer: Self-pay

## 2024-03-15 ENCOUNTER — Other Ambulatory Visit: Payer: Self-pay | Admitting: Allergy

## 2024-03-15 ENCOUNTER — Other Ambulatory Visit (HOSPITAL_COMMUNITY): Payer: Self-pay

## 2024-03-15 NOTE — Progress Notes (Signed)
 Specialty Pharmacy Refill Coordination Note  Clear Bag Patient  [name] is a 73 y.o. female contacted today regarding refills of specialty medication(s) Tezepelumab -ekko (Tezspire )  Doses on hand: 0   Injection appointment: 03/26/24  Patient requested: Courier to Provider Office   Delivery date: 03/20/24   Verified address: [facility] [address] [place] [address]  Medication will be filled on 03/16/24.

## 2024-03-23 DIAGNOSIS — C9310 Chronic myelomonocytic leukemia not having achieved remission: Secondary | ICD-10-CM | POA: Diagnosis not present

## 2024-03-23 DIAGNOSIS — C9200 Acute myeloblastic leukemia, not having achieved remission: Secondary | ICD-10-CM | POA: Diagnosis not present

## 2024-03-24 ENCOUNTER — Encounter: Payer: Self-pay | Admitting: Oncology

## 2024-03-24 DIAGNOSIS — E538 Deficiency of other specified B group vitamins: Secondary | ICD-10-CM | POA: Insufficient documentation

## 2024-03-24 NOTE — Assessment & Plan Note (Addendum)
 Patient previously had folic acid  deficiency  - Continue folic acid  1 mg daily

## 2024-03-26 ENCOUNTER — Ambulatory Visit

## 2024-03-26 ENCOUNTER — Encounter: Payer: Self-pay | Admitting: Oncology

## 2024-03-26 DIAGNOSIS — J4550 Severe persistent asthma, uncomplicated: Secondary | ICD-10-CM | POA: Diagnosis not present

## 2024-03-27 ENCOUNTER — Other Ambulatory Visit: Payer: Self-pay | Admitting: Oncology

## 2024-03-27 DIAGNOSIS — Z95828 Presence of other vascular implants and grafts: Secondary | ICD-10-CM

## 2024-03-27 DIAGNOSIS — C9310 Chronic myelomonocytic leukemia not having achieved remission: Secondary | ICD-10-CM

## 2024-04-01 NOTE — Assessment & Plan Note (Addendum)
 CMML-2, CPSS-Mol Genetic Risk Score = 2, genetic risk group: Intermediate-2, CPSS-Mol score=4 Currently on Azacitidine  75 mg/m for 7 days every 28 days  - Labs reviewed today: CMP: Slightly elevated total bilirubin, normal creatinine normal LFTs, LDH: 558, CBC: WBC: 2.7, hemoglobin: 8.7, platelets: 163, ANC: 1700 - She can proceed with her next cycle of azacitidine -cycle 21 today. - Patient met with Dr. [name] at [facility] for bone marrow transplant.  She is undergoing workup for potential transplant and does have some donors available.  Will be seeing them back mid September - Last transfusion was 12/19/18/2025 and prior to that was 08/02/2023  Return to clinic in 4 weeks for follow-up. Will tentatively plan for next cycle but will hold it if patient is going for transplant in October. Will coordinate with Dr.Powell.

## 2024-04-01 NOTE — Progress Notes (Signed)
 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 - Patient met with Dr. [name] at [facility] for bone marrow transplant.  She is undergoing workup for potential transplant and does have some donors available.  Will be seeing them back mid September - Last transfusion was 12/19/18/2025 and prior to that was 08/02/2023  Return to clinic in 4 weeks for follow-up. Will tentatively plan for next cycle but will hold it if patient is going for transplant in October. Will coordinate with Dr.Powell.    Folate deficiency Patient previously had folic acid  deficiency  - Continue folic acid  1 mg daily Iron deficiency Mild iron def with TSAT <20  -Recommended patient to start taking oral ferrous sulfate 325mg  every other day. Use MiraLAX for constipation.    The patient understands the plans discussed today and is in agreement with them.  She knows to contact our office if she develops concerns prior to her next appointment.  20 minutes of total time was spent for this patient encounter, including preparation, face-to-face counseling with the patient and  coordination of care, physical exam, and documentation of the encounter. > 50% of the time was spent on counseling as documented under my assessment and plan.     [name],acting as a Neurosurgeon for Mickiel Dry, MD.,have documented all relevant documentation on the behalf of Mickiel Dry, MD,as directed by  Mickiel Dry, MD while in the presence of Mickiel Dry, MD.  I, Mickiel Dry MD, have reviewed the above documentation for accuracy and completeness, and I agree with the above.       Mickiel Dry, MD  [facility] [place] - A DEPT OF [facility] [address] [name] Dept: [phone] Dept Fax: [phone]   No orders of the defined types were placed in this encounter.    ONCOLOGY HISTORY:   I have reviewed her chart and materials related to her cancer extensively and collaborated history with the patient. 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 HGB 11. MCV 96. PLT 97.  -03/24/2022: WBC 8.1 with N at 43%, L at 17%, M at 19%, and B at 2%. HGB 9.6. MCV 99. PLT 115.  -04/17/2022: WBC 15.8. HGB 9.7. PLT 128.  -04/26/2022: Peripheral Smear Review: Leukocytosis with left shifted granulocytes and 1% blasts. Macrocytic anemia, thrombocytopenia, and monocytosis.  -04/26/2022: Peripheral flow cytometry: Mildly increased blasts (1%) -05/25/2022 Bone Marrow Biopsy: Hypercellular bone marrow with myeloid hyperplasia with dysgranulopoiesis, erythroid hypoplasia and megakaryocytic hyperplasia with dyspoiesis. Bone marrow  blasts less than 5%. Peripheral blood blasts less than 2%. Cytogenetics showed normal Karyotype. MDS FISH analysis results were normal. -Flow Cytometry: 20% of the aspirate contain monocytes (CD11b, CD13, CD14, CD33, CD38, CD56, CD64 and HLA-DR). Blasts  are not increased.  -The findings are consistent with a primary bone marrow neoplasm with both dysplastic and myeloproliferative features is best classified currently as a chronic myelomonocytic leukemia.  There are less than 2% blasts in the blood and 5% blasts in the bone marrow consistent with CMML-0.  -Cytogenetics: Normal female karyotype -06/04/2022: Serum EPO level 41.4. LDH:416, BCR-ABL: Negative -06/04/2022: NGS: Positive for CBL, MPL, SRSF2, TET 2, RAD21  -06/21/2022: CT CAP: Asymmetrically enlarged left axillary and subpectoral lymph nodes measuring up to 1.2 x 1.1 cm, of uncertain significance and possibly reactive although at least of modest suspicion for lymphoproliferative disorder given other clinical findings. No other lymphadenopathy in the chest, abdomen, or pelvis. Splenomegaly 14.2 cm. -07/08/2022: Initial PET: Mild splenomegaly with normal metabolic activity. Uniform increase in marrow metabolic presumably relates to patient's anemia. Cannot exclude underlying leukemia/bone marrow malignancy. No focal activity present. No lymphadenopathy. -07/16/2022: Bone Marrow Biopsy: Hypercellular bone marrow (95-100%) with myeloid hyperplasia, atypical monocytes, and increased blasts (16% overall).  Atypical monocytosis present 23% of total events, expressing HLA-DR, CD38, CD4 dim, CD11C, CD13, CD14, CD36, CD64 with aberrant coexpression of CD56 and CD7.  -The findings are consistent with chronic myelomonocytic leukemia-2 with early progression to acute myeloid leukemia.  -08/02/2022 - 09/07/2022: 2 cycles of azacitidine  70 mg/m x 7 days every 28 days  -09/21/2022: Bone Marrow Biopsy:  Hypercellular bone marrow (70%) with markedly increased dyspoietic megakaryocytes, increased fibrosis,  and 3% blasts. -09/27/2022-11/02/2022: 2 more cycles of azacitidine  70 mg/m x 7 days every 28 days (4 cycles) -11/16/2022: Bone Marrow Biopsy: Hypercellular marrow (70-80%) with marked dysmegakaryopoiesis,  myelofibrosis and 3% blasts, on a very limited sample. -11/22/2022 - 06/24/2023: Azacitidine  decreased to 70 mg/m x 5 days every 28 days -05/06/2023: Bone Marrow Biopsy: Hypercellular marrow (85%) with cellularity with fibrosis and  and persistent involvement by this patient's known myeloid neoplasm (4% blasts by CD34 IHC analysis). Cytogenetics normal. NGS detected no gene fusions : MPL. SRSF2,RUNX1,TET2: Positive. -07/08/2023: Bone Marrow Biopsy: Hypercellular marrow (95%) with increased myelopoiesis, dysmegakrypoiesis, increased fibrosis and 5% blasts. Cytogenetics normal.  NGS: MPL, SRSF2, RUNX1, TET2. No gene fusions detected -07/25/2023-Current: Azacitidine  70 mg/m x 7 days every 28 days (increased)  -09/13/2023 Bone Marrow Biopsy: Hypercellular marrow (100%) with increased myelopoiesis and this megakaryocytes. Moderate myelofibrosis, grade 2 of 3. No increase in blasts (1%). Cytogenetics normal. -02/24/2024: Peripheral Smear review: Leukopenia with absolute neutropenia and lymphopenia with rare circulating blast seen on scan. Normocytic anemia with anisopoikilocytosis and rare nucleated red blood cells. Thrombocytopenia.   Current Treatment: Azacitidine  70 mg/m x 7 days every 28 days  INTERVAL HISTORY:   [name] is here today for follow up for CMML-2.  She is preparing for a bone marrow transplant, which is tentatively scheduled for mid-October.  She wants to complete her current treatment and proceed with the bone marrow transplant, stating she is 'kind of ready to get done with this.'  No current complaints or new symptoms.  I have reviewed the past medical history, past surgical history, social history and family history with the patient and they are unchanged from previous note.  ALLERGIES:  is allergic to levonorgestrel-ethinyl estrad, sulfa antibiotics, sulfamethoxazole-trimethoprim, and cephalosporins.  MEDICATIONS:  Current Outpatient Medications  Medication Sig Dispense  Refill  albuterol  (VENTOLIN  HFA) 108 (90 Base) MCG/ACT inhaler INHALE TWO PUFFS EVERY 4 HOURS AS NEEDED FOR WHEEZING OR FOR SHORTNESS OF BREATH 18 g 1   albuterol  (VENTOLIN  HFA) 108 (90 Base) MCG/ACT inhaler INHALE 2 PUFFS BY MOUTH EVERY 4 HOURS AS NEEDED FOR SHORTNESS OF BREATH OR WHEEZING. 18 g 1   azaCITIDine  5 mg/2 mLs in lactated ringers  infusion Inject into the vein daily. Days 1-7 every 28 days     azelastine  (ASTELIN ) 0.1 % nasal spray SPRAY 1 TO 2 SPRAYS PER NOSTRIL TWICE DAILY. 30 mL 3   benzonatate  (TESSALON ) 200 MG capsule Take 200 mg by mouth 3 (three) times daily as needed.     BREZTRI  AEROSPHERE 160-9-4.8 MCG/ACT AERO Inhale 2 puffs into the lungs 2 (two) times daily. 10.7 g 5   cetirizine  (ZYRTEC ) 5 MG tablet Take 1 tablet (5 mg total) by mouth daily. 30 tablet 0   ciclopirox  (PENLAC ) 8 % solution APPLY TOPICALLY AT BEDTIME. APPLY OVER NAIL FOLD AND SURROUNDING SKIN. APPLY DAILY OVER PREVIOUS COAT. AFTER SEVEN DAYS, MAY REMOVE WITH ALCOHOL AND CONTINUE CYCLE. 6.6 mL 2   EPINEPHrine  0.3 mg/0.3 mL IJ SOAJ injection Inject 0.3 mg into the muscle as needed for anaphylaxis. 2 each 1   famotidine  (PEPCID ) 20 MG tablet TAKE ONE TABLET BY MOUTH TWICE DAILY 60 tablet 1   FLUoxetine (PROZAC) 20 MG capsule Take 1 capsule by mouth daily.     fluticasone  (FLONASE ) 50 MCG/ACT nasal spray Place 2 sprays into both nostrils 2 (two) times daily. 16 g 5   folic acid  (FOLVITE ) 1 MG tablet TAKE ONE TABLET BY MOUTH ONCE DAILY 30 tablet 5   gabapentin (NEURONTIN) 300 MG capsule      HYDROcodone  bit-homatropine (HYCODAN) 5-1.5 MG/5ML syrup      ipratropium (ATROVENT ) 0.06 % nasal spray USE TWO SPRAYS IN EACH NOSTRIL THREE TIMES DAILY AS NEEDED 15 mL 5   lactulose  (CHRONULAC ) 10 GM/15ML solution Take 15 mLs (10 g total) by mouth every 3 (three) hours as needed for mild constipation. 450 mL 2   lidocaine -prilocaine  (EMLA ) cream Apply a quarter sized amount to port a cath site and cover with plastic wrap one  hour prior to infusion appointments 30 g 3   megestrol  (MEGACE ) 400 MG/10ML suspension Take 10 mLs (400 mg total) by mouth 2 (two) times daily. 480 mL 2   meloxicam  (MOBIC ) 7.5 MG tablet Take 7.5 mg by mouth daily.     montelukast  (SINGULAIR ) 10 MG tablet Take 1 tablet (10 mg total) by mouth at bedtime. 30 tablet 5   NON FORMULARY [facility]  Antifungal (nail)-#1     pantoprazole  (PROTONIX ) 40 MG tablet TAKE ONE TABLET BY MOUTH ONCE DAILY 30 tablet 11   prochlorperazine  (COMPAZINE ) 10 MG tablet Take 1 tablet (10 mg total) by mouth every 6 (six) hours as needed for nausea or vomiting. 30 tablet 0   Respiratory Therapy Supplies (FLUTTER) DEVI Use as directed 1 each 3   SF 5000 PLUS 1.1 % CREA dental cream      Spacer/Aero-Holding Chambers (AEROCHAMBER PLUS WITH MASK) inhaler 1 each by Other route See admin instructions. Use with inhaler as instructed. 1 each 1   Tezepelumab -ekko (TEZSPIRE ) 210 MG/1. SOAJ Inject 210 mg into the skin every 28 (twenty-eight) days. 1.91 mL 11   tiZANidine  (ZANAFLEX ) 2 MG tablet Take 1 tablet (2 mg total) by mouth 2 (two) times daily as needed for muscle spasms. 15 tablet 0   traZODone  (DESYREL )  50 MG tablet TAKE ONE TABLET BY MOUTH AT BEDTIME 30 tablet 3   Current Facility-Administered Medications  Medication Dose Route Frequency Provider Last Rate Last Admin   tezepelumab -ekko (TEZSPIRE ) 210 MG/1. syringe 210 mg  210 mg Subcutaneous Q28 days Jeneal Danita Macintosh, MD   210 mg at 03/26/24 9149    REVIEW OF SYSTEMS:   Constitutional: Denies fevers, chills or abnormal weight loss Eyes: Denies blurriness of vision Ears, nose, mouth, throat, and face: Denies mucositis or sore throat Respiratory: Denies cough, dyspnea or wheezes Cardiovascular: Denies palpitation, chest discomfort or lower extremity swelling Gastrointestinal:  Denies nausea, heartburn or change in bowel habits Skin: Denies abnormal skin rashes Lymphatics: Denies new  lymphadenopathy or easy bruising Neurological:Denies numbness, tingling or new weaknesses Behavioral/Psych: Mood is stable, no new changes  All other systems were reviewed with the patient and are negative.   VITALS:  There were no vitals taken for this visit.  Wt Readings from Last 3 Encounters:  04/02/24 123 lb 6.4 oz (56 kg)  03/13/24 126 lb 1.7 oz (57.2 kg)  03/05/24 124 lb 1.9 oz (56.3 kg)    There is no height or weight on file to calculate BMI.  Performance status (ECOG): 1 - Symptomatic but completely ambulatory  PHYSICAL EXAM:   GENERAL:alert, no distress and comfortable SKIN: skin color, texture, turgor are normal, no rashes or significant lesions LYMPH:  no palpable lymphadenopathy in the cervical, axillary or inguinal LUNGS: clear to auscultation and percussion with normal breathing effort HEART: regular rate & rhythm and no murmurs and no lower extremity edema ABDOMEN:abdomen soft, non-tender and normal bowel sounds Musculoskeletal:no cyanosis of digits and no clubbing  NEURO: alert & oriented x 3 with fluent speech, no focal motor/sensory deficits  LABORATORY DATA:  I have reviewed the data as listed  Last CBC Lab Results  Component Value Date   WBC PENDING 04/02/2024   HGB 8.7 (L) 04/02/2024   HCT 28.6 (L) 04/02/2024   MCV 101.1 (H) 04/02/2024   MCH 30.7 04/02/2024   RDW 19.7 (H) 04/02/2024   PLT 163 04/02/2024      Chemistry      Component Value Date/Time   NA 140 04/02/2024 0830   K 4.0 04/02/2024 0830   CL 105 04/02/2024 0830   CO2 24 04/02/2024 0830   BUN 10 04/02/2024 0830   CREATININE 0.87 04/02/2024 0830      Component Value Date/Time   CALCIUM 9.4 04/02/2024 0830   ALKPHOS 51 04/02/2024 0830   AST 26 04/02/2024 0830   ALT 12 04/02/2024 0830   BILITOT 1.4 (H) 04/02/2024 0830       RADIOGRAPHIC STUDIES: I have personally reviewed the radiological images as listed and agreed with the findings in the report.  None new to review

## 2024-04-01 NOTE — Assessment & Plan Note (Addendum)
 Patient previously had folic acid  deficiency  - Continue folic acid  1 mg daily

## 2024-04-02 ENCOUNTER — Encounter: Payer: Self-pay | Admitting: Oncology

## 2024-04-02 ENCOUNTER — Inpatient Hospital Stay (HOSPITAL_BASED_OUTPATIENT_CLINIC_OR_DEPARTMENT_OTHER): Admitting: Oncology

## 2024-04-02 ENCOUNTER — Inpatient Hospital Stay: Attending: Hematology

## 2024-04-02 ENCOUNTER — Inpatient Hospital Stay

## 2024-04-02 VITALS — BP 97/60 | HR 65 | Temp 97.7°F | Resp 18 | Wt 123.4 lb

## 2024-04-02 DIAGNOSIS — E611 Iron deficiency: Secondary | ICD-10-CM | POA: Diagnosis not present

## 2024-04-02 DIAGNOSIS — Z5111 Encounter for antineoplastic chemotherapy: Secondary | ICD-10-CM | POA: Insufficient documentation

## 2024-04-02 DIAGNOSIS — D709 Neutropenia, unspecified: Secondary | ICD-10-CM | POA: Diagnosis not present

## 2024-04-02 DIAGNOSIS — D7581 Myelofibrosis: Secondary | ICD-10-CM | POA: Insufficient documentation

## 2024-04-02 DIAGNOSIS — Z881 Allergy status to other antibiotic agents status: Secondary | ICD-10-CM | POA: Insufficient documentation

## 2024-04-02 DIAGNOSIS — Z79899 Other long term (current) drug therapy: Secondary | ICD-10-CM | POA: Diagnosis not present

## 2024-04-02 DIAGNOSIS — Z95828 Presence of other vascular implants and grafts: Secondary | ICD-10-CM

## 2024-04-02 DIAGNOSIS — Z7964 Long term (current) use of myelosuppressive agent: Secondary | ICD-10-CM | POA: Insufficient documentation

## 2024-04-02 DIAGNOSIS — K5900 Constipation, unspecified: Secondary | ICD-10-CM | POA: Insufficient documentation

## 2024-04-02 DIAGNOSIS — C9311 Chronic myelomonocytic leukemia, in remission: Secondary | ICD-10-CM | POA: Insufficient documentation

## 2024-04-02 DIAGNOSIS — D696 Thrombocytopenia, unspecified: Secondary | ICD-10-CM | POA: Insufficient documentation

## 2024-04-02 DIAGNOSIS — C9310 Chronic myelomonocytic leukemia not having achieved remission: Secondary | ICD-10-CM | POA: Diagnosis present

## 2024-04-02 DIAGNOSIS — E538 Deficiency of other specified B group vitamins: Secondary | ICD-10-CM

## 2024-04-02 DIAGNOSIS — Z882 Allergy status to sulfonamides status: Secondary | ICD-10-CM | POA: Diagnosis not present

## 2024-04-02 LAB — CBC WITH DIFFERENTIAL/PLATELET
Abs Immature Granulocytes: 0.1 K/uL — ABNORMAL HIGH (ref 0.00–0.07)
Basophils Absolute: 0 K/uL (ref 0.0–0.1)
Basophils Relative: 0 %
Blasts: 3 %
Eosinophils Absolute: 0 K/uL (ref 0.0–0.5)
Eosinophils Relative: 0 %
HCT: 28.6 % — ABNORMAL LOW (ref 36.0–46.0)
Hemoglobin: 8.7 g/dL — ABNORMAL LOW (ref 12.0–15.0)
Lymphocytes Relative: 29 %
Lymphs Abs: 0.8 K/uL (ref 0.7–4.0)
MCH: 30.7 pg (ref 26.0–34.0)
MCHC: 30.4 g/dL (ref 30.0–36.0)
MCV: 101.1 fL — ABNORMAL HIGH (ref 80.0–100.0)
Metamyelocytes Relative: 2 %
Monocytes Absolute: 0 K/uL — ABNORMAL LOW (ref 0.1–1.0)
Monocytes Relative: 1 %
Myelocytes: 3 %
Neutro Abs: 1.7 K/uL (ref 1.7–7.7)
Neutrophils Relative %: 62 %
Platelets: 163 K/uL (ref 150–400)
RBC: 2.83 MIL/uL — ABNORMAL LOW (ref 3.87–5.11)
RDW: 19.7 % — ABNORMAL HIGH (ref 11.5–15.5)
WBC: 2.7 K/uL — ABNORMAL LOW (ref 4.0–10.5)
nRBC: 3 % — ABNORMAL HIGH (ref 0.0–0.2)

## 2024-04-02 LAB — COMPREHENSIVE METABOLIC PANEL WITH GFR
ALT: 12 U/L (ref 0–44)
AST: 26 U/L (ref 15–41)
Albumin: 4 g/dL (ref 3.5–5.0)
Alkaline Phosphatase: 51 U/L (ref 38–126)
Anion gap: 11 (ref 5–15)
BUN: 10 mg/dL (ref 8–23)
CO2: 24 mmol/L (ref 22–32)
Calcium: 9.4 mg/dL (ref 8.9–10.3)
Chloride: 105 mmol/L (ref 98–111)
Creatinine, Ser: 0.87 mg/dL (ref 0.44–1.00)
GFR, Estimated: >60 mL/min (ref >60)
Glucose, Bld: 91 mg/dL (ref 70–99)
Potassium: 4 mmol/L (ref 3.5–5.1)
Sodium: 140 mmol/L (ref 135–145)
Total Bilirubin: 1.4 mg/dL — ABNORMAL HIGH (ref 0.0–1.2)
Total Protein: 7 g/dL (ref 6.5–8.1)

## 2024-04-02 LAB — LACTATE DEHYDROGENASE: LDH: 558 U/L — ABNORMAL HIGH (ref 98–192)

## 2024-04-02 LAB — FERRITIN: Ferritin: 76 ng/mL (ref 11–307)

## 2024-04-02 LAB — IRON AND TIBC
Iron: 63 ug/dL (ref 28–170)
Saturation Ratios: 19 % (ref 10.4–31.8)
TIBC: 326 ug/dL (ref 250–450)
UIBC: 263 ug/dL

## 2024-04-02 LAB — FOLATE: Folate: >20 ng/mL (ref >5.9)

## 2024-04-02 LAB — MAGNESIUM: Magnesium: 2.2 mg/dL (ref 1.7–2.4)

## 2024-04-02 LAB — VITAMIN B12: Vitamin B-12: 160 pg/mL — ABNORMAL LOW (ref 180–914)

## 2024-04-02 MED ORDER — SODIUM CHLORIDE 0.9 % IV SOLN
75.0000 mg/m2 | Freq: Once | INTRAVENOUS | Status: AC
  Administered 2024-04-02: 126 mg via INTRAVENOUS
  Filled 2024-04-02: qty 12.6

## 2024-04-02 MED ORDER — SODIUM CHLORIDE 0.9 % IV SOLN: Freq: Once | INTRAVENOUS | Status: AC

## 2024-04-02 MED ORDER — SODIUM CHLORIDE 0.9% FLUSH: 10.0000 mL | Freq: Once | INTRAVENOUS | Status: DC

## 2024-04-02 MED ORDER — PALONOSETRON HCL INJECTION 0.25 MG/5ML
0.2500 mg | Freq: Once | INTRAVENOUS | Status: AC
  Administered 2024-04-02: 0.25 mg via INTRAVENOUS
  Filled 2024-04-02: qty 5

## 2024-04-02 MED ORDER — HEPARIN SOD (PORK) LOCK FLUSH 100 UNIT/ML IV SOLN: 500.0000 [IU] | Freq: Once | INTRAVENOUS | Status: DC

## 2024-04-02 NOTE — Patient Instructions (Signed)
 [facility] [place] [facility]  Discharge Instructions: Thank you for choosing [facility] to provide your oncology and hematology care.  If you have a lab appointment with the Cancer Center - please note that after April 8th, 2024, all labs will be drawn in the cancer center.  You do not have to check in or register with the main entrance as you have in the past but will complete your check-in in the cancer center.  Wear comfortable clothing and clothing appropriate for easy access to any Portacath or PICC line.   We strive to give you quality time with your provider. You may need to reschedule your appointment if you arrive late (15 or more minutes).  Arriving late affects you and other patients whose appointments are after yours.  Also, if you miss three or more appointments without notifying the office, you may be dismissed from the clinic at the provider's discretion.      For prescription refill requests, have your pharmacy contact our office and allow 72 hours for refills to be completed.    Today you received the following chemotherapy and/or immunotherapy agents Vidaza      To help prevent nausea and vomiting after your treatment, we encourage you to take your nausea medication as directed.  BELOW ARE SYMPTOMS THAT SHOULD BE REPORTED IMMEDIATELY: *FEVER GREATER THAN 100.4 F (38 C) OR HIGHER *CHILLS OR SWEATING *NAUSEA AND VOMITING THAT IS NOT CONTROLLED WITH YOUR NAUSEA MEDICATION *UNUSUAL SHORTNESS OF BREATH *UNUSUAL BRUISING OR BLEEDING *URINARY PROBLEMS (pain or burning when urinating, or frequent urination) *BOWEL PROBLEMS (unusual diarrhea, constipation, pain near the anus) TENDERNESS IN MOUTH AND THROAT WITH OR WITHOUT PRESENCE OF ULCERS (sore throat, sores in mouth, or a toothache) UNUSUAL RASH, SWELLING OR PAIN  UNUSUAL VAGINAL DISCHARGE OR ITCHING   Items with * indicate a potential emergency and should be followed up as  soon as possible or go to the Emergency Department if any problems should occur.  Please show the CHEMOTHERAPY ALERT CARD or IMMUNOTHERAPY ALERT CARD at check-in to the Emergency Department and triage nurse.  Should you have questions after your visit or need to cancel or reschedule your appointment, please contact [facility] [place] - A DEPT OF Eligha Bridegroom [facility] [phone]  and follow the prompts.  Office hours are 8:00 a.m. to 4:30 p.m. Monday - Friday. Please note that voicemails left after 4:00 p.m. may not be returned until the following business day.  We are closed weekends and major holidays. You have access to a nurse at all times for urgent questions. Please call the main number to the clinic [phone] and follow the prompts.  For any non-urgent questions, you may also contact your provider using MyChart. We now offer e-Visits for anyone 72 and older to request care online for non-urgent symptoms. For details visit [email or web address].   Also download the MyChart app! Go to the app store, search "MyChart", open the app, select [place], and log in with your MyChart username and password.

## 2024-04-02 NOTE — Progress Notes (Signed)
 Patient presents today for Vidaza  per providers order.  Vital signs and labs reviewed by MD.  Message received from Isaiah Piety RN/Dr. Davonna patient okay for treatment pending CBC.  CBC within parameters for treatment.  Treatment given today per MD orders.  Stable during infusion without adverse affects.  Vital signs stable.  No complaints at this time.  Discharge from clinic ambulatory in stable condition.  Alert and oriented X 3.  Follow up with [facility] as scheduled.

## 2024-04-02 NOTE — Patient Instructions (Signed)

## 2024-04-02 NOTE — Assessment & Plan Note (Signed)
 Mild iron def with TSAT <20  -Recommended patient to start taking oral ferrous sulfate 325mg  every other day. Use MiraLAX for constipation.

## 2024-04-03 ENCOUNTER — Inpatient Hospital Stay

## 2024-04-03 VITALS — BP 102/65 | HR 88 | Temp 97.6°F | Resp 18

## 2024-04-03 DIAGNOSIS — Z5111 Encounter for antineoplastic chemotherapy: Secondary | ICD-10-CM | POA: Diagnosis not present

## 2024-04-03 DIAGNOSIS — Z95828 Presence of other vascular implants and grafts: Secondary | ICD-10-CM

## 2024-04-03 DIAGNOSIS — C9310 Chronic myelomonocytic leukemia not having achieved remission: Secondary | ICD-10-CM

## 2024-04-03 MED ORDER — SODIUM CHLORIDE 0.9 % IV SOLN
75.0000 mg/m2 | Freq: Once | INTRAVENOUS | Status: AC
  Administered 2024-04-03: 126 mg via INTRAVENOUS
  Filled 2024-04-03: qty 12.6

## 2024-04-03 MED ORDER — SODIUM CHLORIDE 0.9 % IV SOLN: Freq: Once | INTRAVENOUS | Status: AC

## 2024-04-03 NOTE — Progress Notes (Signed)
Patient tolerated chemotherapy with no complaints voiced.  Side effects with management reviewed with understanding verbalized.  Port site clean and dry with no bruising or swelling noted at site.  Good blood return noted before and after administration of chemotherapy.  Dressing intact.   Patient left in satisfactory condition with VSS and no s/s of distress noted.  

## 2024-04-03 NOTE — Patient Instructions (Signed)
 [facility] [place] [facility]  Discharge Instructions: Thank you for choosing [facility] to provide your oncology and hematology care.  If you have a lab appointment with the Cancer Center - please note that after April 8th, 2024, all labs will be drawn in the cancer center.  You do not have to check in or register with the main entrance as you have in the past but will complete your check-in in the cancer center.  Wear comfortable clothing and clothing appropriate for easy access to any Portacath or PICC line.   We strive to give you quality time with your provider. You may need to reschedule your appointment if you arrive late (15 or more minutes).  Arriving late affects you and other patients whose appointments are after yours.  Also, if you miss three or more appointments without notifying the office, you may be dismissed from the clinic at the provider's discretion.      For prescription refill requests, have your pharmacy contact our office and allow 72 hours for refills to be completed.    Today you received the following chemotherapy and/or immunotherapy agents vidaza.       To help prevent nausea and vomiting after your treatment, we encourage you to take your nausea medication as directed.  BELOW ARE SYMPTOMS THAT SHOULD BE REPORTED IMMEDIATELY: *FEVER GREATER THAN 100.4 F (38 C) OR HIGHER *CHILLS OR SWEATING *NAUSEA AND VOMITING THAT IS NOT CONTROLLED WITH YOUR NAUSEA MEDICATION *UNUSUAL SHORTNESS OF BREATH *UNUSUAL BRUISING OR BLEEDING *URINARY PROBLEMS (pain or burning when urinating, or frequent urination) *BOWEL PROBLEMS (unusual diarrhea, constipation, pain near the anus) TENDERNESS IN MOUTH AND THROAT WITH OR WITHOUT PRESENCE OF ULCERS (sore throat, sores in mouth, or a toothache) UNUSUAL RASH, SWELLING OR PAIN  UNUSUAL VAGINAL DISCHARGE OR ITCHING   Items with * indicate a potential emergency and should be followed up  as soon as possible or go to the Emergency Department if any problems should occur.  Please show the CHEMOTHERAPY ALERT CARD or IMMUNOTHERAPY ALERT CARD at check-in to the Emergency Department and triage nurse.  Should you have questions after your visit or need to cancel or reschedule your appointment, please contact [facility] CANCER CTR [place] - A DEPT OF [facility] Bridegroom [facility] [phone]  and follow the prompts.  Office hours are 8:00 a.m. to 4:30 p.m. Monday - Friday. Please note that voicemails left after 4:00 p.m. may not be returned until the following business day.  We are closed weekends and major holidays. You have access to a nurse at all times for urgent questions. Please call the main number to the clinic [phone] and follow the prompts.  For any non-urgent questions, you may also contact your provider using MyChart. We now offer e-Visits for anyone 8 and older to request care online for non-urgent symptoms. For details visit [email or web address].   Also download the MyChart app! Go to the app store, search "MyChart", open the app, select [place], and log in with your MyChart username and password.

## 2024-04-04 ENCOUNTER — Inpatient Hospital Stay

## 2024-04-04 VITALS — BP 98/64 | HR 69 | Temp 96.7°F | Resp 20

## 2024-04-04 DIAGNOSIS — Z5111 Encounter for antineoplastic chemotherapy: Secondary | ICD-10-CM | POA: Diagnosis not present

## 2024-04-04 DIAGNOSIS — Z95828 Presence of other vascular implants and grafts: Secondary | ICD-10-CM

## 2024-04-04 DIAGNOSIS — C9310 Chronic myelomonocytic leukemia not having achieved remission: Secondary | ICD-10-CM

## 2024-04-04 MED ORDER — PALONOSETRON HCL INJECTION 0.25 MG/5ML
0.2500 mg | Freq: Once | INTRAVENOUS | Status: AC
  Administered 2024-04-04: 0.25 mg via INTRAVENOUS
  Filled 2024-04-04: qty 5

## 2024-04-04 MED ORDER — SODIUM CHLORIDE 0.9 % IV SOLN: Freq: Once | INTRAVENOUS | Status: AC

## 2024-04-04 MED ORDER — SODIUM CHLORIDE 0.9 % IV SOLN
75.0000 mg/m2 | Freq: Once | INTRAVENOUS | Status: AC
  Administered 2024-04-04: 126 mg via INTRAVENOUS
  Filled 2024-04-04: qty 12.6

## 2024-04-04 NOTE — Patient Instructions (Signed)
 [facility] [place] [facility]  Discharge Instructions: Thank you for choosing [facility] to provide your oncology and hematology care.  If you have a lab appointment with the Cancer Center - please note that after April 8th, 2024, all labs will be drawn in the cancer center.  You do not have to check in or register with the main entrance as you have in the past but will complete your check-in in the cancer center.  Wear comfortable clothing and clothing appropriate for easy access to any Portacath or PICC line.   We strive to give you quality time with your provider. You may need to reschedule your appointment if you arrive late (15 or more minutes).  Arriving late affects you and other patients whose appointments are after yours.  Also, if you miss three or more appointments without notifying the office, you may be dismissed from the clinic at the provider's discretion.      For prescription refill requests, have your pharmacy contact our office and allow 72 hours for refills to be completed.    Today you received the following chemotherapy and/or immunotherapy agents Vidaza      To help prevent nausea and vomiting after your treatment, we encourage you to take your nausea medication as directed.  BELOW ARE SYMPTOMS THAT SHOULD BE REPORTED IMMEDIATELY: *FEVER GREATER THAN 100.4 F (38 C) OR HIGHER *CHILLS OR SWEATING *NAUSEA AND VOMITING THAT IS NOT CONTROLLED WITH YOUR NAUSEA MEDICATION *UNUSUAL SHORTNESS OF BREATH *UNUSUAL BRUISING OR BLEEDING *URINARY PROBLEMS (pain or burning when urinating, or frequent urination) *BOWEL PROBLEMS (unusual diarrhea, constipation, pain near the anus) TENDERNESS IN MOUTH AND THROAT WITH OR WITHOUT PRESENCE OF ULCERS (sore throat, sores in mouth, or a toothache) UNUSUAL RASH, SWELLING OR PAIN  UNUSUAL VAGINAL DISCHARGE OR ITCHING   Items with * indicate a potential emergency and should be followed up as  soon as possible or go to the Emergency Department if any problems should occur.  Please show the CHEMOTHERAPY ALERT CARD or IMMUNOTHERAPY ALERT CARD at check-in to the Emergency Department and triage nurse.  Should you have questions after your visit or need to cancel or reschedule your appointment, please contact [facility] [place] - A DEPT OF Eligha Bridegroom [facility] [phone]  and follow the prompts.  Office hours are 8:00 a.m. to 4:30 p.m. Monday - Friday. Please note that voicemails left after 4:00 p.m. may not be returned until the following business day.  We are closed weekends and major holidays. You have access to a nurse at all times for urgent questions. Please call the main number to the clinic [phone] and follow the prompts.  For any non-urgent questions, you may also contact your provider using MyChart. We now offer e-Visits for anyone 72 and older to request care online for non-urgent symptoms. For details visit [email or web address].   Also download the MyChart app! Go to the app store, search "MyChart", open the app, select [place], and log in with your MyChart username and password.

## 2024-04-04 NOTE — Progress Notes (Signed)
 Patient presents today for Day 3 Vidaza  infusion per providers order.  Vital signs within parameters for treatment.  Patient has no new complaints at this time.  Stable during infusion without adverse affects.  Vital signs stable.  No complaints at this time.  Discharge from clinic ambulatory in stable condition.  Alert and oriented X 3.  Follow up with [facility] as scheduled.

## 2024-04-05 ENCOUNTER — Inpatient Hospital Stay

## 2024-04-05 VITALS — BP 104/64 | HR 70 | Temp 97.3°F | Resp 18

## 2024-04-05 DIAGNOSIS — Z95828 Presence of other vascular implants and grafts: Secondary | ICD-10-CM

## 2024-04-05 DIAGNOSIS — Z5111 Encounter for antineoplastic chemotherapy: Secondary | ICD-10-CM | POA: Diagnosis not present

## 2024-04-05 DIAGNOSIS — E538 Deficiency of other specified B group vitamins: Secondary | ICD-10-CM | POA: Diagnosis not present

## 2024-04-05 DIAGNOSIS — C9310 Chronic myelomonocytic leukemia not having achieved remission: Secondary | ICD-10-CM

## 2024-04-05 MED ORDER — SODIUM CHLORIDE 0.9 % IV SOLN
75.0000 mg/m2 | Freq: Once | INTRAVENOUS | Status: AC
  Administered 2024-04-05: 126 mg via INTRAVENOUS
  Filled 2024-04-05: qty 12.6

## 2024-04-05 MED ORDER — SODIUM CHLORIDE 0.9 % IV SOLN: Freq: Once | INTRAVENOUS | Status: AC

## 2024-04-05 NOTE — Patient Instructions (Signed)
 [facility] [place] [facility]  Discharge Instructions: Thank you for choosing [facility] to provide your oncology and hematology care.  If you have a lab appointment with the Cancer Center - please note that after April 8th, 2024, all labs will be drawn in the cancer center.  You do not have to check in or register with the main entrance as you have in the past but will complete your check-in in the cancer center.  Wear comfortable clothing and clothing appropriate for easy access to any Portacath or PICC line.   We strive to give you quality time with your provider. You may need to reschedule your appointment if you arrive late (15 or more minutes).  Arriving late affects you and other patients whose appointments are after yours.  Also, if you miss three or more appointments without notifying the office, you may be dismissed from the clinic at the provider's discretion.      For prescription refill requests, have your pharmacy contact our office and allow 72 hours for refills to be completed.    Today you received the following chemotherapy and/or immunotherapy agents Vidaza      To help prevent nausea and vomiting after your treatment, we encourage you to take your nausea medication as directed.  BELOW ARE SYMPTOMS THAT SHOULD BE REPORTED IMMEDIATELY: *FEVER GREATER THAN 100.4 F (38 C) OR HIGHER *CHILLS OR SWEATING *NAUSEA AND VOMITING THAT IS NOT CONTROLLED WITH YOUR NAUSEA MEDICATION *UNUSUAL SHORTNESS OF BREATH *UNUSUAL BRUISING OR BLEEDING *URINARY PROBLEMS (pain or burning when urinating, or frequent urination) *BOWEL PROBLEMS (unusual diarrhea, constipation, pain near the anus) TENDERNESS IN MOUTH AND THROAT WITH OR WITHOUT PRESENCE OF ULCERS (sore throat, sores in mouth, or a toothache) UNUSUAL RASH, SWELLING OR PAIN  UNUSUAL VAGINAL DISCHARGE OR ITCHING   Items with * indicate a potential emergency and should be followed up as  soon as possible or go to the Emergency Department if any problems should occur.  Please show the CHEMOTHERAPY ALERT CARD or IMMUNOTHERAPY ALERT CARD at check-in to the Emergency Department and triage nurse.  Should you have questions after your visit or need to cancel or reschedule your appointment, please contact [facility] [place] - A DEPT OF Eligha Bridegroom [facility] [phone]  and follow the prompts.  Office hours are 8:00 a.m. to 4:30 p.m. Monday - Friday. Please note that voicemails left after 4:00 p.m. may not be returned until the following business day.  We are closed weekends and major holidays. You have access to a nurse at all times for urgent questions. Please call the main number to the clinic [phone] and follow the prompts.  For any non-urgent questions, you may also contact your provider using MyChart. We now offer e-Visits for anyone 72 and older to request care online for non-urgent symptoms. For details visit [email or web address].   Also download the MyChart app! Go to the app store, search "MyChart", open the app, select [place], and log in with your MyChart username and password.

## 2024-04-05 NOTE — Progress Notes (Signed)
 Patient presents today for Day 4 Vidaza  infusion per providers order.  Vital signs within parameters for treatment.  Patient has no new complaints at this time.    Treatment given today per MD orders.  Stable during infusion without adverse affects.  Vital signs stable.  No complaints at this time.  Discharge from clinic ambulatory in stable condition.  Alert and oriented X 3.  Follow up with [facility] as scheduled.

## 2024-04-06 ENCOUNTER — Other Ambulatory Visit: Payer: Self-pay

## 2024-04-06 ENCOUNTER — Inpatient Hospital Stay

## 2024-04-06 VITALS — BP 106/67 | HR 66 | Temp 97.4°F | Resp 18

## 2024-04-06 DIAGNOSIS — K5900 Constipation, unspecified: Secondary | ICD-10-CM | POA: Diagnosis not present

## 2024-04-06 DIAGNOSIS — C9311 Chronic myelomonocytic leukemia, in remission: Secondary | ICD-10-CM | POA: Diagnosis not present

## 2024-04-06 DIAGNOSIS — Z95828 Presence of other vascular implants and grafts: Secondary | ICD-10-CM

## 2024-04-06 DIAGNOSIS — D709 Neutropenia, unspecified: Secondary | ICD-10-CM | POA: Diagnosis not present

## 2024-04-06 DIAGNOSIS — E538 Deficiency of other specified B group vitamins: Secondary | ICD-10-CM | POA: Diagnosis not present

## 2024-04-06 DIAGNOSIS — Z7682 Awaiting organ transplant status: Secondary | ICD-10-CM | POA: Diagnosis not present

## 2024-04-06 DIAGNOSIS — E611 Iron deficiency: Secondary | ICD-10-CM | POA: Diagnosis not present

## 2024-04-06 DIAGNOSIS — D7581 Myelofibrosis: Secondary | ICD-10-CM | POA: Diagnosis not present

## 2024-04-06 DIAGNOSIS — Z7964 Long term (current) use of myelosuppressive agent: Secondary | ICD-10-CM | POA: Diagnosis not present

## 2024-04-06 DIAGNOSIS — Z01818 Encounter for other preprocedural examination: Secondary | ICD-10-CM | POA: Diagnosis not present

## 2024-04-06 DIAGNOSIS — D696 Thrombocytopenia, unspecified: Secondary | ICD-10-CM | POA: Diagnosis not present

## 2024-04-06 DIAGNOSIS — C9310 Chronic myelomonocytic leukemia not having achieved remission: Secondary | ICD-10-CM

## 2024-04-06 DIAGNOSIS — Z5111 Encounter for antineoplastic chemotherapy: Secondary | ICD-10-CM | POA: Diagnosis not present

## 2024-04-06 MED ORDER — PALONOSETRON HCL INJECTION 0.25 MG/5ML
0.2500 mg | Freq: Once | INTRAVENOUS | Status: AC
  Administered 2024-04-06: 0.25 mg via INTRAVENOUS
  Filled 2024-04-06: qty 5

## 2024-04-06 MED ORDER — SODIUM CHLORIDE 0.9 % IV SOLN: Freq: Once | INTRAVENOUS | Status: AC

## 2024-04-06 MED ORDER — SODIUM CHLORIDE 0.9 % IV SOLN
75.0000 mg/m2 | Freq: Once | INTRAVENOUS | Status: AC
  Administered 2024-04-06: 126 mg via INTRAVENOUS
  Filled 2024-04-06: qty 12.6

## 2024-04-06 NOTE — Patient Instructions (Signed)
 [facility] [place] [facility]  Discharge Instructions: Thank you for choosing [facility] to provide your oncology and hematology care.  If you have a lab appointment with the Cancer Center - please note that after April 8th, 2024, all labs will be drawn in the cancer center.  You do not have to check in or register with the main entrance as you have in the past but will complete your check-in in the cancer center.  Wear comfortable clothing and clothing appropriate for easy access to any Portacath or PICC line.   We strive to give you quality time with your provider. You may need to reschedule your appointment if you arrive late (15 or more minutes).  Arriving late affects you and other patients whose appointments are after yours.  Also, if you miss three or more appointments without notifying the office, you may be dismissed from the clinic at the provider's discretion.      For prescription refill requests, have your pharmacy contact our office and allow 72 hours for refills to be completed.    Today you received the following chemotherapy and/or immunotherapy agents Vidaza      To help prevent nausea and vomiting after your treatment, we encourage you to take your nausea medication as directed.  BELOW ARE SYMPTOMS THAT SHOULD BE REPORTED IMMEDIATELY: *FEVER GREATER THAN 100.4 F (38 C) OR HIGHER *CHILLS OR SWEATING *NAUSEA AND VOMITING THAT IS NOT CONTROLLED WITH YOUR NAUSEA MEDICATION *UNUSUAL SHORTNESS OF BREATH *UNUSUAL BRUISING OR BLEEDING *URINARY PROBLEMS (pain or burning when urinating, or frequent urination) *BOWEL PROBLEMS (unusual diarrhea, constipation, pain near the anus) TENDERNESS IN MOUTH AND THROAT WITH OR WITHOUT PRESENCE OF ULCERS (sore throat, sores in mouth, or a toothache) UNUSUAL RASH, SWELLING OR PAIN  UNUSUAL VAGINAL DISCHARGE OR ITCHING   Items with * indicate a potential emergency and should be followed up as  soon as possible or go to the Emergency Department if any problems should occur.  Please show the CHEMOTHERAPY ALERT CARD or IMMUNOTHERAPY ALERT CARD at check-in to the Emergency Department and triage nurse.  Should you have questions after your visit or need to cancel or reschedule your appointment, please contact [facility] [place] - A DEPT OF Eligha Bridegroom [facility] [phone]  and follow the prompts.  Office hours are 8:00 a.m. to 4:30 p.m. Monday - Friday. Please note that voicemails left after 4:00 p.m. may not be returned until the following business day.  We are closed weekends and major holidays. You have access to a nurse at all times for urgent questions. Please call the main number to the clinic [phone] and follow the prompts.  For any non-urgent questions, you may also contact your provider using MyChart. We now offer e-Visits for anyone 72 and older to request care online for non-urgent symptoms. For details visit [email or web address].   Also download the MyChart app! Go to the app store, search "MyChart", open the app, select [place], and log in with your MyChart username and password.

## 2024-04-06 NOTE — Progress Notes (Signed)
 Patient presents today for Day 5 Vidaza  infusion per providers order.  Vital signs within parameters for treatment.  Patient has no new complaints at this time.  Treatment given today per MD orders.  Stable during infusion without adverse affects.  Vital signs stable.  No complaints at this time.  Discharge from clinic ambulatory in stable condition.  Alert and oriented X 3.  Follow up with [facility] as scheduled.

## 2024-04-06 NOTE — Progress Notes (Signed)
 Specialty Pharmacy Refill Coordination Note  [name] is a 73 y.o. female assessed today regarding refills of clinic administered specialty medication(s) Tezepelumab -ekko (Tezspire )   Clinic requested Courier to Provider Office   Delivery date: 04/16/24   Injection date: 04/23/24  Verified address: [facility] [place]- [address] [place] [address]   Medication will be filled on 04/13/24.

## 2024-04-09 ENCOUNTER — Inpatient Hospital Stay

## 2024-04-09 ENCOUNTER — Encounter: Payer: Self-pay | Admitting: Oncology

## 2024-04-09 VITALS — BP 118/68 | HR 77 | Temp 98.2°F | Resp 18 | Wt 123.9 lb

## 2024-04-09 VITALS — BP 108/69 | HR 69 | Temp 96.0°F | Resp 19

## 2024-04-09 DIAGNOSIS — Z7964 Long term (current) use of myelosuppressive agent: Secondary | ICD-10-CM | POA: Diagnosis not present

## 2024-04-09 DIAGNOSIS — D7581 Myelofibrosis: Secondary | ICD-10-CM | POA: Diagnosis not present

## 2024-04-09 DIAGNOSIS — E538 Deficiency of other specified B group vitamins: Secondary | ICD-10-CM | POA: Diagnosis not present

## 2024-04-09 DIAGNOSIS — Z95828 Presence of other vascular implants and grafts: Secondary | ICD-10-CM

## 2024-04-09 DIAGNOSIS — D709 Neutropenia, unspecified: Secondary | ICD-10-CM | POA: Diagnosis not present

## 2024-04-09 DIAGNOSIS — D696 Thrombocytopenia, unspecified: Secondary | ICD-10-CM | POA: Diagnosis not present

## 2024-04-09 DIAGNOSIS — C9311 Chronic myelomonocytic leukemia, in remission: Secondary | ICD-10-CM | POA: Diagnosis not present

## 2024-04-09 DIAGNOSIS — Z5111 Encounter for antineoplastic chemotherapy: Secondary | ICD-10-CM | POA: Diagnosis not present

## 2024-04-09 DIAGNOSIS — C9310 Chronic myelomonocytic leukemia not having achieved remission: Secondary | ICD-10-CM

## 2024-04-09 DIAGNOSIS — K5900 Constipation, unspecified: Secondary | ICD-10-CM | POA: Diagnosis not present

## 2024-04-09 LAB — COMPREHENSIVE METABOLIC PANEL WITH GFR
ALT: 12 U/L (ref 0–44)
AST: 21 U/L (ref 15–41)
Albumin: 4 g/dL (ref 3.5–5.0)
Alkaline Phosphatase: 54 U/L (ref 38–126)
Anion gap: 14 (ref 5–15)
BUN: 12 mg/dL (ref 8–23)
CO2: 23 mmol/L (ref 22–32)
Calcium: 9.4 mg/dL (ref 8.9–10.3)
Chloride: 102 mmol/L (ref 98–111)
Creatinine, Ser: 0.83 mg/dL (ref 0.44–1.00)
GFR, Estimated: >60 mL/min (ref >60)
Glucose, Bld: 100 mg/dL — ABNORMAL HIGH (ref 70–99)
Potassium: 3.6 mmol/L (ref 3.5–5.1)
Sodium: 139 mmol/L (ref 135–145)
Total Bilirubin: 1.1 mg/dL (ref 0.0–1.2)
Total Protein: 6.9 g/dL (ref 6.5–8.1)

## 2024-04-09 LAB — CBC WITH DIFFERENTIAL/PLATELET
Abs Immature Granulocytes: 0.3 K/uL — ABNORMAL HIGH (ref 0.00–0.07)
Basophils Absolute: 0 K/uL (ref 0.0–0.1)
Basophils Relative: 0 %
Blasts: 1 %
Eosinophils Absolute: 0.1 K/uL (ref 0.0–0.5)
Eosinophils Relative: 3 %
HCT: 27.2 % — ABNORMAL LOW (ref 36.0–46.0)
Hemoglobin: 8.5 g/dL — ABNORMAL LOW (ref 12.0–15.0)
Lymphocytes Relative: 17 %
Lymphs Abs: 0.4 K/uL — ABNORMAL LOW (ref 0.7–4.0)
MCH: 31.4 pg (ref 26.0–34.0)
MCHC: 31.3 g/dL (ref 30.0–36.0)
MCV: 100.4 fL — ABNORMAL HIGH (ref 80.0–100.0)
Metamyelocytes Relative: 8 %
Monocytes Absolute: 0 K/uL — ABNORMAL LOW (ref 0.1–1.0)
Monocytes Relative: 1 %
Myelocytes: 5 %
Neutro Abs: 1.4 K/uL — ABNORMAL LOW (ref 1.7–7.7)
Neutrophils Relative %: 63 %
Platelets: 102 K/uL — ABNORMAL LOW (ref 150–400)
Promyelocytes Relative: 2 %
RBC: 2.71 MIL/uL — ABNORMAL LOW (ref 3.87–5.11)
RDW: 18.7 % — ABNORMAL HIGH (ref 11.5–15.5)
WBC: 2.3 K/uL — ABNORMAL LOW (ref 4.0–10.5)
nRBC: 2.6 % — ABNORMAL HIGH (ref 0.0–0.2)

## 2024-04-09 LAB — MAGNESIUM: Magnesium: 2.1 mg/dL (ref 1.7–2.4)

## 2024-04-09 MED ORDER — PALONOSETRON HCL INJECTION 0.25 MG/5ML
0.2500 mg | Freq: Once | INTRAVENOUS | Status: AC
  Administered 2024-04-09: 0.25 mg via INTRAVENOUS
  Filled 2024-04-09: qty 5

## 2024-04-09 MED ORDER — SODIUM CHLORIDE 0.9 % IV SOLN
75.0000 mg/m2 | Freq: Once | INTRAVENOUS | Status: AC
  Administered 2024-04-09: 126 mg via INTRAVENOUS
  Filled 2024-04-09: qty 12.6

## 2024-04-09 MED ORDER — SODIUM CHLORIDE 0.9 % IV SOLN: Freq: Once | INTRAVENOUS | Status: AC

## 2024-04-09 NOTE — Progress Notes (Signed)
   04/09/24 1057  Spiritual Encounters  Type of Visit Follow up  Care provided to: Patient  Conversation partners present during encounter Nurse  Referral source Chaplain assessment  Reason for visit Routine spiritual support  OnCall Visit No   Reason for Visit: Chaplain making scheduled follow-up with Pt I previously connected with.  Description of Visit: I entered the room and found Farron sitting in the chair receiving treatment, with no support person present.  My first question was to ask if everything is still moving forward with the stem cell therapy she has been working toward. She replied that it is indeed still a go.  We spoke briefly about her preparations for the long period of isolation she will have to undergo and what she is packing for that time.  Analucia continues to appear well supported and she reports her caregiving needs are being met.  Plan of Care: I will continue to follow-up with Vanecia on a monthly basis.   [name], MDiv  Chaplain, [facility] [email or web address] [phone]

## 2024-04-09 NOTE — Patient Instructions (Signed)
 [facility] [place] - A DEPT OF [facility]  Discharge Instructions: Thank you for choosing [facility] to provide your oncology and hematology care.  If you have a lab appointment with the Cancer Center - please note that after April 8th, 2024, all labs will be drawn in the cancer center.  You do not have to check in or register with the main entrance as you have in the past but will complete your check-in in the cancer center.  Wear comfortable clothing and clothing appropriate for easy access to any Portacath or PICC line.   We strive to give you quality time with your provider. You may need to reschedule your appointment if you arrive late (15 or more minutes).  Arriving late affects you and other patients whose appointments are after yours.  Also, if you miss three or more appointments without notifying the office, you may be dismissed from the clinic at the provider's discretion.      For prescription refill requests, have your pharmacy contact our office and allow 72 hours for refills to be completed.    Today you received the following chemotherapy and/or immunotherapy agents Vidaza.  Azacitidine Injection What is this medication? AZACITIDINE (ay za SITE i deen) treats blood and bone marrow cancers. It works by slowing down the growth of cancer cells. This medicine may be used for other purposes; ask your health care provider or pharmacist if you have questions. COMMON BRAND NAME(S): Vidaza What should I tell my care team before I take this medication? They need to know if you have any of these conditions: Kidney disease Liver disease Low blood cell levels, such as low white cells, platelets, or red blood cells Low levels of albumin in the blood Low levels of bicarbonate in the blood An unusual or allergic reaction to azacitidine, mannitol, other medications, foods, dyes, or preservatives If you or your partner are pregnant or trying to get  pregnant Breast-feeding How should I use this medication? This medication is injected into a vein or under the skin. It is given by your care team in a hospital or clinic setting. Talk to your care team about the use of this medication in children. While it may be prescribed for children as young as 1 month for selected conditions, precautions do apply. Overdosage: If you think you have taken too much of this medicine contact a poison control center or emergency room at once. NOTE: This medicine is only for you. Do not share this medicine with others. What if I miss a dose? Keep appointments for follow-up doses. It is important not to miss your dose. Call your care team if you are unable to keep an appointment. What may interact with this medication? Interactions are not expected. This list may not describe all possible interactions. Give your health care provider a list of all the medicines, herbs, non-prescription drugs, or dietary supplements you use. Also tell them if you smoke, drink alcohol, or use illegal drugs. Some items may interact with your medicine. What should I watch for while using this medication? Your condition will be monitored carefully while you are receiving this medication. This medication may make you feel generally unwell. This is not uncommon as chemotherapy can affect healthy cells as well as cancer cells. Report any side effects. Continue your course of treatment even though you feel ill unless your care team tells you to stop. You may need blood work done while you are  taking this medication. Other product types may be available that contain the medication azacitidine. The injection and oral products should not be used in place of one another. Talk to your care team if you have questions. This medication can cause serious side effects. To reduce the risk, your care team may give you other medications to take before receiving this one. Be sure to follow the directions from  your care team. This medication may increase your risk of getting an infection. Call your care team for advice if you get a fever, chills, sore throat, or other symptoms of a cold or flu. Do not treat yourself. Try to avoid being around people who are sick. Avoid taking medications that contain aspirin, acetaminophen, ibuprofen, naproxen, or ketoprofen unless instructed by your care team. These medications may hide a fever. Be careful brushing or flossing your teeth or using a toothpick because you may get an infection or bleed more easily. If you have any dental work done, tell your dentist you are receiving this medication. Talk to your care team if you or your partner may be pregnant. Serious birth defects can occur if you take this medication during pregnancy and for 6 months after the last dose. You will need a negative pregnancy test before starting this medication. Contraception is recommended while taking this medication and for 6 months after the last dose. Your care team can help you find the option that works for you. If your partner can get pregnant, use a condom during sex while taking this medication and for 3 months after the last dose. Do not breastfeed while taking this medication and for 1 week after the last dose. This medication may cause infertility. Talk to your care team if you are concerned about your fertility. What side effects may I notice from receiving this medication? Side effects that you should report to your care team as soon as possible: Allergic reactions--skin rash, itching, hives, swelling of the face, lips, tongue, or throat Infection--fever, chills, cough, sore throat, wounds that don't heal, pain or trouble when passing urine, general feeling of discomfort or being unwell Kidney injury--decrease in the amount of urine, swelling of the ankles, hands, or feet Liver injury--right upper belly pain, loss of appetite, nausea, light-colored stool, dark yellow or [name]  urine, yellowing skin or eyes, unusual weakness or fatigue Low red blood cell level--unusual weakness or fatigue, dizziness, headache, trouble breathing Tumor lysis syndrome (TLS)--nausea, vomiting, diarrhea, decrease in the amount of urine, dark urine, unusual weakness or fatigue, confusion, muscle pain or cramps, fast or irregular heartbeat, joint pain Unusual bruising or bleeding Side effects that usually do not require medical attention (report to your care team if they continue or are bothersome): Constipation Diarrhea Nausea Pain, redness, or irritation at injection site Vomiting This list may not describe all possible side effects. Call your doctor for medical advice about side effects. You may report side effects to FDA at 1-800-FDA-1088. Where should I keep my medication? This medication is given in a hospital or clinic. It will not be stored at home. NOTE: This sheet is a summary. It may not cover all possible information. If you have questions about this medicine, talk to your doctor, pharmacist, or health care provider.  2024 Elsevier/Gold Standard (2023-03-07 00:00:00)       To help prevent nausea and vomiting after your treatment, we encourage you to take your nausea medication as directed.  BELOW ARE SYMPTOMS THAT SHOULD BE REPORTED IMMEDIATELY: *FEVER GREATER THAN 100.4  F (38 C) OR HIGHER *CHILLS OR SWEATING *NAUSEA AND VOMITING THAT IS NOT CONTROLLED WITH YOUR NAUSEA MEDICATION *UNUSUAL SHORTNESS OF BREATH *UNUSUAL BRUISING OR BLEEDING *URINARY PROBLEMS (pain or burning when urinating, or frequent urination) *BOWEL PROBLEMS (unusual diarrhea, constipation, pain near the anus) TENDERNESS IN MOUTH AND THROAT WITH OR WITHOUT PRESENCE OF ULCERS (sore throat, sores in mouth, or a toothache) UNUSUAL RASH, SWELLING OR PAIN  UNUSUAL VAGINAL DISCHARGE OR ITCHING   Items with * indicate a potential emergency and should be followed up as soon as possible or go to the Emergency  Department if any problems should occur.  Please show the CHEMOTHERAPY ALERT CARD or IMMUNOTHERAPY ALERT CARD at check-in to the Emergency Department and triage nurse.  Should you have questions after your visit or need to cancel or reschedule your appointment, please contact [facility] [place] - A DEPT OF [facility] [phone]  and follow the prompts.  Office hours are 8:00 a.m. to 4:30 p.m. Monday - Friday. Please note that voicemails left after 4:00 p.m. may not be returned until the following business day.  We are closed weekends and major holidays. You have access to a nurse at all times for urgent questions. Please call the main number to the clinic [phone] and follow the prompts.  For any non-urgent questions, you may also contact your provider using MyChart. We now offer e-Visits for anyone 88 and older to request care online for non-urgent symptoms. For details visit [email or web address].   Also download the MyChart app! Go to the app store, search "MyChart", open the app, select [place], and log in with your MyChart username and password.

## 2024-04-09 NOTE — Progress Notes (Signed)
 Patient presents today for chemotherapy infusion of Vidaza .  Patient is in satisfactory condition with no new complaints voiced.  Vital signs are stable. Vitals are within treatment parameters.  We will proceed with treatment per MD orders.    Patient tolerated treatment well with no complaints voiced.  Patient left ambulatory in stable condition.  Vital signs stable at discharge.  Follow up as scheduled.

## 2024-04-10 ENCOUNTER — Inpatient Hospital Stay

## 2024-04-10 ENCOUNTER — Encounter: Payer: Self-pay | Admitting: Oncology

## 2024-04-10 VITALS — BP 98/64 | HR 70 | Temp 97.3°F | Resp 18

## 2024-04-10 DIAGNOSIS — E538 Deficiency of other specified B group vitamins: Secondary | ICD-10-CM | POA: Diagnosis not present

## 2024-04-10 DIAGNOSIS — C9310 Chronic myelomonocytic leukemia not having achieved remission: Secondary | ICD-10-CM

## 2024-04-10 DIAGNOSIS — D7581 Myelofibrosis: Secondary | ICD-10-CM | POA: Diagnosis not present

## 2024-04-10 DIAGNOSIS — Z5111 Encounter for antineoplastic chemotherapy: Secondary | ICD-10-CM | POA: Diagnosis not present

## 2024-04-10 DIAGNOSIS — K5900 Constipation, unspecified: Secondary | ICD-10-CM | POA: Diagnosis not present

## 2024-04-10 DIAGNOSIS — C9311 Chronic myelomonocytic leukemia, in remission: Secondary | ICD-10-CM | POA: Diagnosis not present

## 2024-04-10 DIAGNOSIS — D696 Thrombocytopenia, unspecified: Secondary | ICD-10-CM | POA: Diagnosis not present

## 2024-04-10 DIAGNOSIS — Z95828 Presence of other vascular implants and grafts: Secondary | ICD-10-CM

## 2024-04-10 DIAGNOSIS — Z7964 Long term (current) use of myelosuppressive agent: Secondary | ICD-10-CM | POA: Diagnosis not present

## 2024-04-10 DIAGNOSIS — D709 Neutropenia, unspecified: Secondary | ICD-10-CM | POA: Diagnosis not present

## 2024-04-10 MED ORDER — SODIUM CHLORIDE 0.9 % IV SOLN: Freq: Once | INTRAVENOUS | Status: AC

## 2024-04-10 MED ORDER — SODIUM CHLORIDE 0.9 % IV SOLN
75.0000 mg/m2 | Freq: Once | INTRAVENOUS | Status: AC
  Administered 2024-04-10: 126 mg via INTRAVENOUS
  Filled 2024-04-10: qty 12.6

## 2024-04-10 NOTE — Patient Instructions (Signed)
 [facility] [place] [facility]  Discharge Instructions: Thank you for choosing [facility] to provide your oncology and hematology care.  If you have a lab appointment with the Cancer Center - please note that after April 8th, 2024, all labs will be drawn in the cancer center.  You do not have to check in or register with the main entrance as you have in the past but will complete your check-in in the cancer center.  Wear comfortable clothing and clothing appropriate for easy access to any Portacath or PICC line.   We strive to give you quality time with your provider. You may need to reschedule your appointment if you arrive late (15 or more minutes).  Arriving late affects you and other patients whose appointments are after yours.  Also, if you miss three or more appointments without notifying the office, you may be dismissed from the clinic at the provider's discretion.      For prescription refill requests, have your pharmacy contact our office and allow 72 hours for refills to be completed.    Today you received the following chemotherapy and/or immunotherapy agents Vidaza      To help prevent nausea and vomiting after your treatment, we encourage you to take your nausea medication as directed.  BELOW ARE SYMPTOMS THAT SHOULD BE REPORTED IMMEDIATELY: *FEVER GREATER THAN 100.4 F (38 C) OR HIGHER *CHILLS OR SWEATING *NAUSEA AND VOMITING THAT IS NOT CONTROLLED WITH YOUR NAUSEA MEDICATION *UNUSUAL SHORTNESS OF BREATH *UNUSUAL BRUISING OR BLEEDING *URINARY PROBLEMS (pain or burning when urinating, or frequent urination) *BOWEL PROBLEMS (unusual diarrhea, constipation, pain near the anus) TENDERNESS IN MOUTH AND THROAT WITH OR WITHOUT PRESENCE OF ULCERS (sore throat, sores in mouth, or a toothache) UNUSUAL RASH, SWELLING OR PAIN  UNUSUAL VAGINAL DISCHARGE OR ITCHING   Items with * indicate a potential emergency and should be followed up as  soon as possible or go to the Emergency Department if any problems should occur.  Please show the CHEMOTHERAPY ALERT CARD or IMMUNOTHERAPY ALERT CARD at check-in to the Emergency Department and triage nurse.  Should you have questions after your visit or need to cancel or reschedule your appointment, please contact [facility] [place] - A DEPT OF Eligha Bridegroom [facility] [phone]  and follow the prompts.  Office hours are 8:00 a.m. to 4:30 p.m. Monday - Friday. Please note that voicemails left after 4:00 p.m. may not be returned until the following business day.  We are closed weekends and major holidays. You have access to a nurse at all times for urgent questions. Please call the main number to the clinic [phone] and follow the prompts.  For any non-urgent questions, you may also contact your provider using MyChart. We now offer e-Visits for anyone 72 and older to request care online for non-urgent symptoms. For details visit [email or web address].   Also download the MyChart app! Go to the app store, search "MyChart", open the app, select [place], and log in with your MyChart username and password.

## 2024-04-10 NOTE — Progress Notes (Signed)
 Patient presents today for Day 7 Vidaza  per providers order.  Vital signs within parameters for treatment.  Patient has no new complaints at this time.  Treatment given today per MD orders.  Stable during infusion without adverse affects.  Vital signs stable.  No complaints at this time.  Discharge from clinic ambulatory in stable condition.  Alert and oriented X 3.  Follow up with [facility] as scheduled.

## 2024-04-13 ENCOUNTER — Other Ambulatory Visit: Payer: Self-pay

## 2024-04-23 ENCOUNTER — Ambulatory Visit

## 2024-04-23 ENCOUNTER — Other Ambulatory Visit: Payer: Self-pay | Admitting: Oncology

## 2024-04-23 DIAGNOSIS — C9310 Chronic myelomonocytic leukemia not having achieved remission: Secondary | ICD-10-CM

## 2024-04-23 DIAGNOSIS — J4550 Severe persistent asthma, uncomplicated: Secondary | ICD-10-CM | POA: Diagnosis not present

## 2024-04-23 DIAGNOSIS — Z95828 Presence of other vascular implants and grafts: Secondary | ICD-10-CM

## 2024-04-24 DIAGNOSIS — C9200 Acute myeloblastic leukemia, not having achieved remission: Secondary | ICD-10-CM | POA: Diagnosis not present

## 2024-04-24 NOTE — Progress Notes (Signed)
 Bone Marrow Bx +/- Asp  Date/Time: 04/24/2024 10:00 AM  Performed by: Lucie Charletta Pa, PA-C Authorized by: Lucie Charletta Pa, PA-C  Consent: Verbal consent obtained. Written consent obtained Risks and benefits: risks, benefits and alternatives were discussed Consent given by: patient Patient understanding: patient states understanding of the procedure being performed Patient consent: the patient's understanding of the procedure matches consent given Procedure consent: procedure consent matches procedure scheduled Relevant documents: relevant documents present and verified Test results: test results available and properly labeled Required items: required blood products, implants, devices, and special equipment available Patient identity confirmed: verbally with patient and arm band Time out: Immediately prior to procedure a time out was called to verify the correct patient, procedure, equipment, support staff and site/side marked as required. Preparation: Patient was prepped and draped in the usual sterile fashion. Local anesthesia used: yes Anesthesia: local infiltration  Anesthesia: Local anesthesia used: yes Local Anesthetic: lidocaine  2% without epinephrine  Anesthetic total: 6 mL Patient tolerance: patient tolerated the procedure well with no immediate complications Comments: Premedications: None   Patient placed in prone position.  Left posterior iliac crest(s) prepped with CHG and draped in sterile fashion.  6 mL of lidocaine  2% local anesthesia infiltrated into the subcutaneous tissue.  Left bone marrow biopsy and left bone marrow aspirate were obtained using powered driver kit.  Presence of spicules were confirmed with lab technician after obtaining first aspirate sample.  The procedure was tolerated well and there were no complications.  Specimens sent for: routine histopathologic stains and sectioning, cytogenetics, MRD and NGS.  Post procedure instructions  were provided and understanding was verbalized.

## 2024-04-25 ENCOUNTER — Other Ambulatory Visit: Payer: Self-pay | Admitting: *Deleted

## 2024-04-25 ENCOUNTER — Inpatient Hospital Stay: Attending: Hematology

## 2024-04-25 ENCOUNTER — Encounter: Payer: Self-pay | Admitting: Oncology

## 2024-04-25 DIAGNOSIS — C9310 Chronic myelomonocytic leukemia not having achieved remission: Secondary | ICD-10-CM

## 2024-04-25 DIAGNOSIS — D696 Thrombocytopenia, unspecified: Secondary | ICD-10-CM | POA: Diagnosis not present

## 2024-04-25 DIAGNOSIS — Z79899 Other long term (current) drug therapy: Secondary | ICD-10-CM | POA: Diagnosis not present

## 2024-04-25 DIAGNOSIS — Z7682 Awaiting organ transplant status: Secondary | ICD-10-CM

## 2024-04-25 LAB — CBC WITH DIFFERENTIAL/PLATELET
Abs Immature Granulocytes: 0.2 K/uL — ABNORMAL HIGH (ref 0.00–0.07)
Basophils Absolute: 0 K/uL (ref 0.0–0.1)
Basophils Relative: 0 %
Eosinophils Absolute: 0.1 K/uL (ref 0.0–0.5)
Eosinophils Relative: 2 %
HCT: 28.1 % — ABNORMAL LOW (ref 36.0–46.0)
Hemoglobin: 8.7 g/dL — ABNORMAL LOW (ref 12.0–15.0)
Lymphocytes Relative: 27 %
Lymphs Abs: 0.7 K/uL (ref 0.7–4.0)
MCH: 31.4 pg (ref 26.0–34.0)
MCHC: 31 g/dL (ref 30.0–36.0)
MCV: 101.4 fL — ABNORMAL HIGH (ref 80.0–100.0)
Metamyelocytes Relative: 2 %
Monocytes Absolute: 0.3 K/uL (ref 0.1–1.0)
Monocytes Relative: 13 %
Myelocytes: 4 %
Neutro Abs: 1.4 K/uL — ABNORMAL LOW (ref 1.7–7.7)
Neutrophils Relative %: 52 %
Platelets: 113 K/uL — ABNORMAL LOW (ref 150–400)
RBC: 2.77 MIL/uL — ABNORMAL LOW (ref 3.87–5.11)
RDW: 19.9 % — ABNORMAL HIGH (ref 11.5–15.5)
WBC: 2.6 K/uL — ABNORMAL LOW (ref 4.0–10.5)
nRBC: 3.4 % — ABNORMAL HIGH (ref 0.0–0.2)

## 2024-04-25 LAB — COMPREHENSIVE METABOLIC PANEL WITH GFR
ALT: 13 U/L (ref 0–44)
AST: 28 U/L (ref 15–41)
Albumin: 4.4 g/dL (ref 3.5–5.0)
Alkaline Phosphatase: 62 U/L (ref 38–126)
Anion gap: 9 (ref 5–15)
BUN: 14 mg/dL (ref 8–23)
CO2: 26 mmol/L (ref 22–32)
Calcium: 9.7 mg/dL (ref 8.9–10.3)
Chloride: 103 mmol/L (ref 98–111)
Creatinine, Ser: 0.98 mg/dL (ref 0.44–1.00)
GFR, Estimated: >60 mL/min
Glucose, Bld: 98 mg/dL (ref 70–99)
Potassium: 4.3 mmol/L (ref 3.5–5.1)
Sodium: 139 mmol/L (ref 135–145)
Total Bilirubin: 1.2 mg/dL (ref 0.0–1.2)
Total Protein: 7 g/dL (ref 6.5–8.1)

## 2024-04-25 LAB — BILIRUBIN, FRACTIONATED(TOT/DIR/INDIR)
Bilirubin, Direct: 0.5 mg/dL — ABNORMAL HIGH (ref 0.0–0.2)
Indirect Bilirubin: 0.7 mg/dL (ref 0.3–0.9)
Total Bilirubin: 1.2 mg/dL (ref 0.0–1.2)

## 2024-04-25 NOTE — Progress Notes (Signed)
 Patients port flushed without difficulty.  Good blood return noted with no bruising or swelling noted at site.  Labs drawn per orders.  VSS with discharge and left in satisfactory condition with no s/s of distress noted. All follow ups as scheduled.       [name]

## 2024-04-25 NOTE — Telephone Encounter (Addendum)
 Received outside labs from [facility] for a cdp and cmp collected today.  The results have been entered. thanks

## 2024-04-26 NOTE — Telephone Encounter (Signed)
 Labs reviewed and stable no changes needed

## 2024-04-30 ENCOUNTER — Inpatient Hospital Stay

## 2024-04-30 ENCOUNTER — Encounter: Payer: Self-pay | Admitting: Oncology

## 2024-04-30 ENCOUNTER — Inpatient Hospital Stay: Admitting: Oncology

## 2024-04-30 DIAGNOSIS — J45909 Unspecified asthma, uncomplicated: Secondary | ICD-10-CM | POA: Diagnosis not present

## 2024-04-30 DIAGNOSIS — Z7682 Awaiting organ transplant status: Secondary | ICD-10-CM | POA: Diagnosis not present

## 2024-04-30 DIAGNOSIS — R4189 Other symptoms and signs involving cognitive functions and awareness: Secondary | ICD-10-CM | POA: Diagnosis not present

## 2024-04-30 DIAGNOSIS — C9310 Chronic myelomonocytic leukemia not having achieved remission: Secondary | ICD-10-CM | POA: Diagnosis not present

## 2024-04-30 DIAGNOSIS — Z01818 Encounter for other preprocedural examination: Secondary | ICD-10-CM | POA: Diagnosis not present

## 2024-05-01 ENCOUNTER — Inpatient Hospital Stay

## 2024-05-02 ENCOUNTER — Inpatient Hospital Stay

## 2024-05-03 ENCOUNTER — Inpatient Hospital Stay

## 2024-05-04 ENCOUNTER — Inpatient Hospital Stay

## 2024-05-04 ENCOUNTER — Other Ambulatory Visit: Payer: Self-pay

## 2024-05-07 ENCOUNTER — Inpatient Hospital Stay

## 2024-05-08 ENCOUNTER — Inpatient Hospital Stay

## 2024-05-08 DIAGNOSIS — Z7682 Awaiting organ transplant status: Secondary | ICD-10-CM | POA: Diagnosis not present

## 2024-05-08 DIAGNOSIS — Z01818 Encounter for other preprocedural examination: Secondary | ICD-10-CM | POA: Diagnosis not present

## 2024-05-08 DIAGNOSIS — C9310 Chronic myelomonocytic leukemia not having achieved remission: Secondary | ICD-10-CM | POA: Diagnosis not present

## 2024-05-14 ENCOUNTER — Other Ambulatory Visit (HOSPITAL_COMMUNITY): Payer: Self-pay

## 2024-05-14 ENCOUNTER — Other Ambulatory Visit: Payer: Self-pay

## 2024-05-14 NOTE — Progress Notes (Signed)
 Specialty Pharmacy Refill Coordination Note  [name] is a 73 y.o. female assessed today regarding refills of clinic administered specialty medication(s) Tezepelumab -ekko (Tezspire )   Clinic requested Courier to Provider Office   Delivery date: 05/17/24   Verified address: [facility] [place]- [address] [place] [address]   Medication will be filled on: 05/16/24

## 2024-05-18 ENCOUNTER — Ambulatory Visit (INDEPENDENT_AMBULATORY_CARE_PROVIDER_SITE_OTHER)

## 2024-05-18 DIAGNOSIS — J4550 Severe persistent asthma, uncomplicated: Secondary | ICD-10-CM

## 2024-05-21 ENCOUNTER — Inpatient Hospital Stay: Attending: Hematology

## 2024-05-21 ENCOUNTER — Inpatient Hospital Stay

## 2024-05-21 ENCOUNTER — Encounter: Payer: Self-pay | Admitting: Oncology

## 2024-05-21 VITALS — BP 110/84 | HR 68 | Temp 97.0°F | Resp 18

## 2024-05-21 DIAGNOSIS — C9310 Chronic myelomonocytic leukemia not having achieved remission: Secondary | ICD-10-CM | POA: Insufficient documentation

## 2024-05-21 DIAGNOSIS — Z95828 Presence of other vascular implants and grafts: Secondary | ICD-10-CM

## 2024-05-21 DIAGNOSIS — D696 Thrombocytopenia, unspecified: Secondary | ICD-10-CM | POA: Insufficient documentation

## 2024-05-21 DIAGNOSIS — Z79899 Other long term (current) drug therapy: Secondary | ICD-10-CM | POA: Insufficient documentation

## 2024-05-21 LAB — COMPREHENSIVE METABOLIC PANEL WITH GFR
ALT: 18 U/L (ref 0–44)
AST: 35 U/L (ref 15–41)
Albumin: 4.5 g/dL (ref 3.5–5.0)
Alkaline Phosphatase: 66 U/L (ref 38–126)
Anion gap: 9 (ref 5–15)
BUN: 9 mg/dL (ref 8–23)
CO2: 28 mmol/L (ref 22–32)
Calcium: 9.3 mg/dL (ref 8.9–10.3)
Chloride: 104 mmol/L (ref 98–111)
Creatinine, Ser: 0.8 mg/dL (ref 0.44–1.00)
GFR, Estimated: >60 mL/min (ref >60)
Glucose, Bld: 97 mg/dL (ref 70–99)
Potassium: 3.5 mmol/L (ref 3.5–5.1)
Sodium: 141 mmol/L (ref 135–145)
Total Bilirubin: 1.1 mg/dL (ref 0.0–1.2)
Total Protein: 6.8 g/dL (ref 6.5–8.1)

## 2024-05-21 LAB — CBC WITH DIFFERENTIAL/PLATELET
Abs Immature Granulocytes: 0.27 K/uL — ABNORMAL HIGH (ref 0.00–0.07)
Basophils Absolute: 0 K/uL (ref 0.0–0.1)
Basophils Relative: 1 %
Eosinophils Absolute: 0.1 K/uL (ref 0.0–0.5)
Eosinophils Relative: 3 %
HCT: 27.9 % — ABNORMAL LOW (ref 36.0–46.0)
Hemoglobin: 8.7 g/dL — ABNORMAL LOW (ref 12.0–15.0)
Immature Granulocytes: 8 %
Lymphocytes Relative: 19 %
Lymphs Abs: 0.7 K/uL (ref 0.7–4.0)
MCH: 30.7 pg (ref 26.0–34.0)
MCHC: 31.2 g/dL (ref 30.0–36.0)
MCV: 98.6 fL (ref 80.0–100.0)
Monocytes Absolute: 0.7 K/uL (ref 0.1–1.0)
Monocytes Relative: 19 %
Neutro Abs: 1.8 K/uL (ref 1.7–7.7)
Neutrophils Relative %: 50 %
Platelets: 99 K/uL — ABNORMAL LOW (ref 150–400)
RBC: 2.83 MIL/uL — ABNORMAL LOW (ref 3.87–5.11)
RDW: 18.6 % — ABNORMAL HIGH (ref 11.5–15.5)
WBC: 3.5 K/uL — ABNORMAL LOW (ref 4.0–10.5)
nRBC: 3.7 % — ABNORMAL HIGH (ref 0.0–0.2)

## 2024-05-21 LAB — MAGNESIUM: Magnesium: 2 mg/dL (ref 1.7–2.4)

## 2024-05-21 LAB — LACTATE DEHYDROGENASE: LDH: 644 U/L — ABNORMAL HIGH (ref 98–192)

## 2024-05-21 MED ORDER — SODIUM CHLORIDE 0.9 % IV SOLN
75.0000 mg/m2 | Freq: Once | INTRAVENOUS | Status: AC
  Administered 2024-05-21: 126 mg via INTRAVENOUS
  Filled 2024-05-21: qty 12.6

## 2024-05-21 MED ORDER — SODIUM CHLORIDE 0.9 % IV SOLN: Freq: Once | INTRAVENOUS | Status: AC

## 2024-05-21 MED ORDER — PALONOSETRON HCL INJECTION 0.25 MG/5ML
0.2500 mg | Freq: Once | INTRAVENOUS | Status: AC
  Administered 2024-05-21: 0.25 mg via INTRAVENOUS
  Filled 2024-05-21: qty 5

## 2024-05-21 NOTE — Progress Notes (Signed)
 Ok to treat with platelets of 99k  V.O. Delon Hope, NP/Tamlyn Sides Joshua, PharmD

## 2024-05-21 NOTE — Patient Instructions (Signed)
 [facility] [place] [facility]  Discharge Instructions: Thank you for choosing [facility] to provide your oncology and hematology care.  If you have a lab appointment with the Cancer Center - please note that after April 8th, 2024, all labs will be drawn in the cancer center.  You do not have to check in or register with the main entrance as you have in the past but will complete your check-in in the cancer center.  Wear comfortable clothing and clothing appropriate for easy access to any Portacath or PICC line.   We strive to give you quality time with your provider. You may need to reschedule your appointment if you arrive late (15 or more minutes).  Arriving late affects you and other patients whose appointments are after yours.  Also, if you miss three or more appointments without notifying the office, you may be dismissed from the clinic at the provider's discretion.      For prescription refill requests, have your pharmacy contact our office and allow 72 hours for refills to be completed.    Today you received the following chemotherapy and/or immunotherapy agents vidaza.       To help prevent nausea and vomiting after your treatment, we encourage you to take your nausea medication as directed.  BELOW ARE SYMPTOMS THAT SHOULD BE REPORTED IMMEDIATELY: *FEVER GREATER THAN 100.4 F (38 C) OR HIGHER *CHILLS OR SWEATING *NAUSEA AND VOMITING THAT IS NOT CONTROLLED WITH YOUR NAUSEA MEDICATION *UNUSUAL SHORTNESS OF BREATH *UNUSUAL BRUISING OR BLEEDING *URINARY PROBLEMS (pain or burning when urinating, or frequent urination) *BOWEL PROBLEMS (unusual diarrhea, constipation, pain near the anus) TENDERNESS IN MOUTH AND THROAT WITH OR WITHOUT PRESENCE OF ULCERS (sore throat, sores in mouth, or a toothache) UNUSUAL RASH, SWELLING OR PAIN  UNUSUAL VAGINAL DISCHARGE OR ITCHING   Items with * indicate a potential emergency and should be followed up  as soon as possible or go to the Emergency Department if any problems should occur.  Please show the CHEMOTHERAPY ALERT CARD or IMMUNOTHERAPY ALERT CARD at check-in to the Emergency Department and triage nurse.  Should you have questions after your visit or need to cancel or reschedule your appointment, please contact [facility] CANCER CTR [place] - A DEPT OF [facility] Bridegroom [facility] [phone]  and follow the prompts.  Office hours are 8:00 a.m. to 4:30 p.m. Monday - Friday. Please note that voicemails left after 4:00 p.m. may not be returned until the following business day.  We are closed weekends and major holidays. You have access to a nurse at all times for urgent questions. Please call the main number to the clinic [phone] and follow the prompts.  For any non-urgent questions, you may also contact your provider using MyChart. We now offer e-Visits for anyone 8 and older to request care online for non-urgent symptoms. For details visit [email or web address].   Also download the MyChart app! Go to the app store, search "MyChart", open the app, select [place], and log in with your MyChart username and password.

## 2024-05-21 NOTE — Progress Notes (Signed)
 Patient presents today for Vidaza  infusion per providers order.  Vital signs within parameters for treatment.  Labs reviewed, platelets noted to be 99, provider notified.  Message received from Delon Hope NP, okay to proceed with Vidaza .  Treatment given today per MD orders.  Stable during infusion without adverse affects.  Vital signs stable.  No complaints at this time.  Discharge from clinic ambulatory in stable condition.  Alert and oriented X 3.  Follow up with [facility] as scheduled.

## 2024-05-22 ENCOUNTER — Other Ambulatory Visit: Payer: Self-pay | Admitting: Allergy

## 2024-05-22 ENCOUNTER — Encounter: Payer: Self-pay | Admitting: Oncology

## 2024-05-22 ENCOUNTER — Inpatient Hospital Stay

## 2024-05-22 VITALS — BP 100/64 | HR 74 | Temp 96.6°F | Resp 19

## 2024-05-22 DIAGNOSIS — C9310 Chronic myelomonocytic leukemia not having achieved remission: Secondary | ICD-10-CM | POA: Diagnosis not present

## 2024-05-22 DIAGNOSIS — Z95828 Presence of other vascular implants and grafts: Secondary | ICD-10-CM

## 2024-05-22 MED ORDER — SODIUM CHLORIDE 0.9 % IV SOLN
75.0000 mg/m2 | Freq: Once | INTRAVENOUS | Status: AC
  Administered 2024-05-22: 126 mg via INTRAVENOUS
  Filled 2024-05-22: qty 12.6

## 2024-05-22 MED ORDER — SODIUM CHLORIDE 0.9 % IV SOLN: Freq: Once | INTRAVENOUS | Status: AC

## 2024-05-22 MED FILL — Azacitidine For Inj 100 MG: INTRAMUSCULAR | Qty: 12.6 | Status: AC

## 2024-05-22 NOTE — Patient Instructions (Signed)
 [facility] [place] [facility]  Discharge Instructions: Thank you for choosing [facility] to provide your oncology and hematology care.  If you have a lab appointment with the Cancer Center - please note that after April 8th, 2024, all labs will be drawn in the cancer center.  You do not have to check in or register with the main entrance as you have in the past but will complete your check-in in the cancer center.  Wear comfortable clothing and clothing appropriate for easy access to any Portacath or PICC line.   We strive to give you quality time with your provider. You may need to reschedule your appointment if you arrive late (15 or more minutes).  Arriving late affects you and other patients whose appointments are after yours.  Also, if you miss three or more appointments without notifying the office, you may be dismissed from the clinic at the provider's discretion.      For prescription refill requests, have your pharmacy contact our office and allow 72 hours for refills to be completed.    Today you received the following chemotherapy and/or immunotherapy agents vidaza.       To help prevent nausea and vomiting after your treatment, we encourage you to take your nausea medication as directed.  BELOW ARE SYMPTOMS THAT SHOULD BE REPORTED IMMEDIATELY: *FEVER GREATER THAN 100.4 F (38 C) OR HIGHER *CHILLS OR SWEATING *NAUSEA AND VOMITING THAT IS NOT CONTROLLED WITH YOUR NAUSEA MEDICATION *UNUSUAL SHORTNESS OF BREATH *UNUSUAL BRUISING OR BLEEDING *URINARY PROBLEMS (pain or burning when urinating, or frequent urination) *BOWEL PROBLEMS (unusual diarrhea, constipation, pain near the anus) TENDERNESS IN MOUTH AND THROAT WITH OR WITHOUT PRESENCE OF ULCERS (sore throat, sores in mouth, or a toothache) UNUSUAL RASH, SWELLING OR PAIN  UNUSUAL VAGINAL DISCHARGE OR ITCHING   Items with * indicate a potential emergency and should be followed up  as soon as possible or go to the Emergency Department if any problems should occur.  Please show the CHEMOTHERAPY ALERT CARD or IMMUNOTHERAPY ALERT CARD at check-in to the Emergency Department and triage nurse.  Should you have questions after your visit or need to cancel or reschedule your appointment, please contact [facility] CANCER CTR [place] - A DEPT OF [facility] Bridegroom [facility] [phone]  and follow the prompts.  Office hours are 8:00 a.m. to 4:30 p.m. Monday - Friday. Please note that voicemails left after 4:00 p.m. may not be returned until the following business day.  We are closed weekends and major holidays. You have access to a nurse at all times for urgent questions. Please call the main number to the clinic [phone] and follow the prompts.  For any non-urgent questions, you may also contact your provider using MyChart. We now offer e-Visits for anyone 8 and older to request care online for non-urgent symptoms. For details visit [email or web address].   Also download the MyChart app! Go to the app store, search "MyChart", open the app, select [place], and log in with your MyChart username and password.

## 2024-05-22 NOTE — Progress Notes (Signed)
 Patient tolerated chemotherapy with no complaints voiced.  Side effects with management reviewed with understanding verbalized.  Port site clean and dry with no bruising or swelling noted at site.  Good blood return noted before and after administration of chemotherapy.   Port left accessed per pt request.  Patient left in satisfactory condition with VSS and no s/s of distress noted. All follow ups as scheduled.   [name]

## 2024-05-23 ENCOUNTER — Inpatient Hospital Stay

## 2024-05-23 ENCOUNTER — Encounter: Payer: Self-pay | Admitting: Oncology

## 2024-05-23 VITALS — BP 95/67 | HR 79 | Temp 98.2°F | Resp 18

## 2024-05-23 DIAGNOSIS — C9310 Chronic myelomonocytic leukemia not having achieved remission: Secondary | ICD-10-CM

## 2024-05-23 DIAGNOSIS — Z95828 Presence of other vascular implants and grafts: Secondary | ICD-10-CM

## 2024-05-23 MED ORDER — SODIUM CHLORIDE 0.9 % IV SOLN: Freq: Once | INTRAVENOUS | Status: AC

## 2024-05-23 MED ORDER — SODIUM CHLORIDE 0.9 % IV SOLN
75.0000 mg/m2 | Freq: Once | INTRAVENOUS | Status: AC
  Administered 2024-05-23: 126 mg via INTRAVENOUS
  Filled 2024-05-23: qty 12.6

## 2024-05-23 MED ORDER — PALONOSETRON HCL INJECTION 0.25 MG/5ML
0.2500 mg | Freq: Once | INTRAVENOUS | Status: AC
  Administered 2024-05-23: 0.25 mg via INTRAVENOUS
  Filled 2024-05-23: qty 5

## 2024-05-23 MED FILL — Azacitidine For Inj 100 MG: INTRAMUSCULAR | Qty: 12.6 | Status: AC

## 2024-05-23 NOTE — Patient Instructions (Signed)
 [facility] [place] - A DEPT OF [facility]  Discharge Instructions: Thank you for choosing [facility] to provide your oncology and hematology care.  If you have a lab appointment with the Cancer Center - please note that after April 8th, 2024, all labs will be drawn in the cancer center.  You do not have to check in or register with the main entrance as you have in the past but will complete your check-in in the cancer center.  Wear comfortable clothing and clothing appropriate for easy access to any Portacath or PICC line.   We strive to give you quality time with your provider. You may need to reschedule your appointment if you arrive late (15 or more minutes).  Arriving late affects you and other patients whose appointments are after yours.  Also, if you miss three or more appointments without notifying the office, you may be dismissed from the clinic at the provider's discretion.      For prescription refill requests, have your pharmacy contact our office and allow 72 hours for refills to be completed.    Today you received the following chemotherapy and/or immunotherapy agents Vidaza , return as scheduled.   To help prevent nausea and vomiting after your treatment, we encourage you to take your nausea medication as directed.  BELOW ARE SYMPTOMS THAT SHOULD BE REPORTED IMMEDIATELY: *FEVER GREATER THAN 100.4 F (38 C) OR HIGHER *CHILLS OR SWEATING *NAUSEA AND VOMITING THAT IS NOT CONTROLLED WITH YOUR NAUSEA MEDICATION *UNUSUAL SHORTNESS OF BREATH *UNUSUAL BRUISING OR BLEEDING *URINARY PROBLEMS (pain or burning when urinating, or frequent urination) *BOWEL PROBLEMS (unusual diarrhea, constipation, pain near the anus) TENDERNESS IN MOUTH AND THROAT WITH OR WITHOUT PRESENCE OF ULCERS (sore throat, sores in mouth, or a toothache) UNUSUAL RASH, SWELLING OR PAIN  UNUSUAL VAGINAL DISCHARGE OR ITCHING   Items with * indicate a potential emergency and  should be followed up as soon as possible or go to the Emergency Department if any problems should occur.  Please show the CHEMOTHERAPY ALERT CARD or IMMUNOTHERAPY ALERT CARD at check-in to the Emergency Department and triage nurse.  Should you have questions after your visit or need to cancel or reschedule your appointment, please contact [facility] [place] - A DEPT OF [facility] [phone]  and follow the prompts.  Office hours are 8:00 a.m. to 4:30 p.m. Monday - Friday. Please note that voicemails left after 4:00 p.m. may not be returned until the following business day.  We are closed weekends and major holidays. You have access to a nurse at all times for urgent questions. Please call the main number to the clinic [phone] and follow the prompts.  For any non-urgent questions, you may also contact your provider using MyChart. We now offer e-Visits for anyone 45 and older to request care online for non-urgent symptoms. For details visit [email or web address].   Also download the MyChart app! Go to the app store, search MyChart, open the app, select [facility], and log in with your MyChart username and password.

## 2024-05-23 NOTE — Progress Notes (Signed)
Patient tolerated chemotherapy with no complaints voiced. Side effects with management reviewed understanding verbalized. Port site clean and dry with no bruising or swelling noted at site. Good blood return noted before and after administration of chemotherapy.  Patient left in satisfactory condition with VSS and no s/s of distress noted. 

## 2024-05-24 ENCOUNTER — Inpatient Hospital Stay

## 2024-05-24 ENCOUNTER — Encounter: Payer: Self-pay | Admitting: Oncology

## 2024-05-24 VITALS — BP 104/58 | HR 74 | Temp 96.8°F | Resp 18

## 2024-05-24 DIAGNOSIS — Z95828 Presence of other vascular implants and grafts: Secondary | ICD-10-CM

## 2024-05-24 DIAGNOSIS — C9310 Chronic myelomonocytic leukemia not having achieved remission: Secondary | ICD-10-CM | POA: Diagnosis not present

## 2024-05-24 MED ORDER — SODIUM CHLORIDE 0.9 % IV SOLN
75.0000 mg/m2 | Freq: Once | INTRAVENOUS | Status: AC
  Administered 2024-05-24: 126 mg via INTRAVENOUS
  Filled 2024-05-24: qty 12.6

## 2024-05-24 MED ORDER — SODIUM CHLORIDE 0.9 % IV SOLN: Freq: Once | INTRAVENOUS | Status: AC

## 2024-05-24 NOTE — Progress Notes (Signed)
Patient presents today for Vidaza infusion per providers order.  Vital signs within parameters for treatment.  Patient has no new complaints at this time.  Treatment given today per MD orders.  Stable during infusion without adverse affects.  Vital signs stable.  No complaints at this time.  Discharge from clinic ambulatory in stable condition.  Alert and oriented X 3.  Follow up with [facility] as scheduled.

## 2024-05-24 NOTE — Patient Instructions (Signed)
 [facility] [place] [facility]  Discharge Instructions: Thank you for choosing [facility] to provide your oncology and hematology care.  If you have a lab appointment with the Cancer Center - please note that after April 8th, 2024, all labs will be drawn in the cancer center.  You do not have to check in or register with the main entrance as you have in the past but will complete your check-in in the cancer center.  Wear comfortable clothing and clothing appropriate for easy access to any Portacath or PICC line.   We strive to give you quality time with your provider. You may need to reschedule your appointment if you arrive late (15 or more minutes).  Arriving late affects you and other patients whose appointments are after yours.  Also, if you miss three or more appointments without notifying the office, you may be dismissed from the clinic at the provider's discretion.      For prescription refill requests, have your pharmacy contact our office and allow 72 hours for refills to be completed.    Today you received the following chemotherapy and/or immunotherapy agents Vidaza      To help prevent nausea and vomiting after your treatment, we encourage you to take your nausea medication as directed.  BELOW ARE SYMPTOMS THAT SHOULD BE REPORTED IMMEDIATELY: *FEVER GREATER THAN 100.4 F (38 C) OR HIGHER *CHILLS OR SWEATING *NAUSEA AND VOMITING THAT IS NOT CONTROLLED WITH YOUR NAUSEA MEDICATION *UNUSUAL SHORTNESS OF BREATH *UNUSUAL BRUISING OR BLEEDING *URINARY PROBLEMS (pain or burning when urinating, or frequent urination) *BOWEL PROBLEMS (unusual diarrhea, constipation, pain near the anus) TENDERNESS IN MOUTH AND THROAT WITH OR WITHOUT PRESENCE OF ULCERS (sore throat, sores in mouth, or a toothache) UNUSUAL RASH, SWELLING OR PAIN  UNUSUAL VAGINAL DISCHARGE OR ITCHING   Items with * indicate a potential emergency and should be followed up as  soon as possible or go to the Emergency Department if any problems should occur.  Please show the CHEMOTHERAPY ALERT CARD or IMMUNOTHERAPY ALERT CARD at check-in to the Emergency Department and triage nurse.  Should you have questions after your visit or need to cancel or reschedule your appointment, please contact [facility] [place] - A DEPT OF Eligha Bridegroom [facility] [phone]  and follow the prompts.  Office hours are 8:00 a.m. to 4:30 p.m. Monday - Friday. Please note that voicemails left after 4:00 p.m. may not be returned until the following business day.  We are closed weekends and major holidays. You have access to a nurse at all times for urgent questions. Please call the main number to the clinic [phone] and follow the prompts.  For any non-urgent questions, you may also contact your provider using MyChart. We now offer e-Visits for anyone 72 and older to request care online for non-urgent symptoms. For details visit [email or web address].   Also download the MyChart app! Go to the app store, search "MyChart", open the app, select [place], and log in with your MyChart username and password.

## 2024-05-25 ENCOUNTER — Inpatient Hospital Stay

## 2024-05-25 VITALS — BP 125/69 | HR 73 | Temp 97.0°F | Resp 18

## 2024-05-25 DIAGNOSIS — C9310 Chronic myelomonocytic leukemia not having achieved remission: Secondary | ICD-10-CM | POA: Diagnosis not present

## 2024-05-25 DIAGNOSIS — Z95828 Presence of other vascular implants and grafts: Secondary | ICD-10-CM

## 2024-05-25 MED ORDER — SODIUM CHLORIDE 0.9 % IV SOLN: Freq: Once | INTRAVENOUS | Status: AC

## 2024-05-25 MED ORDER — PALONOSETRON HCL INJECTION 0.25 MG/5ML
0.2500 mg | Freq: Once | INTRAVENOUS | Status: AC
  Administered 2024-05-25: 0.25 mg via INTRAVENOUS
  Filled 2024-05-25: qty 5

## 2024-05-25 MED ORDER — SODIUM CHLORIDE 0.9 % IV SOLN
75.0000 mg/m2 | Freq: Once | INTRAVENOUS | Status: AC
  Administered 2024-05-25: 126 mg via INTRAVENOUS
  Filled 2024-05-25: qty 12.6

## 2024-05-25 NOTE — Progress Notes (Signed)
Patient presents today for Vidaza infusion per providers order.  Vital signs within parameters for treatment.  Patient has no new complaints at this time.  Treatment given today per MD orders.  Stable during infusion without adverse affects.  Vital signs stable.  No complaints at this time.  Discharge from clinic ambulatory in stable condition.  Alert and oriented X 3.  Follow up with [facility] as scheduled.

## 2024-05-25 NOTE — Patient Instructions (Signed)
 [facility] [place] [facility]  Discharge Instructions: Thank you for choosing [facility] to provide your oncology and hematology care.  If you have a lab appointment with the Cancer Center - please note that after April 8th, 2024, all labs will be drawn in the cancer center.  You do not have to check in or register with the main entrance as you have in the past but will complete your check-in in the cancer center.  Wear comfortable clothing and clothing appropriate for easy access to any Portacath or PICC line.   We strive to give you quality time with your provider. You may need to reschedule your appointment if you arrive late (15 or more minutes).  Arriving late affects you and other patients whose appointments are after yours.  Also, if you miss three or more appointments without notifying the office, you may be dismissed from the clinic at the provider's discretion.      For prescription refill requests, have your pharmacy contact our office and allow 72 hours for refills to be completed.    Today you received the following chemotherapy and/or immunotherapy agents Vidaza      To help prevent nausea and vomiting after your treatment, we encourage you to take your nausea medication as directed.  BELOW ARE SYMPTOMS THAT SHOULD BE REPORTED IMMEDIATELY: *FEVER GREATER THAN 100.4 F (38 C) OR HIGHER *CHILLS OR SWEATING *NAUSEA AND VOMITING THAT IS NOT CONTROLLED WITH YOUR NAUSEA MEDICATION *UNUSUAL SHORTNESS OF BREATH *UNUSUAL BRUISING OR BLEEDING *URINARY PROBLEMS (pain or burning when urinating, or frequent urination) *BOWEL PROBLEMS (unusual diarrhea, constipation, pain near the anus) TENDERNESS IN MOUTH AND THROAT WITH OR WITHOUT PRESENCE OF ULCERS (sore throat, sores in mouth, or a toothache) UNUSUAL RASH, SWELLING OR PAIN  UNUSUAL VAGINAL DISCHARGE OR ITCHING   Items with * indicate a potential emergency and should be followed up as  soon as possible or go to the Emergency Department if any problems should occur.  Please show the CHEMOTHERAPY ALERT CARD or IMMUNOTHERAPY ALERT CARD at check-in to the Emergency Department and triage nurse.  Should you have questions after your visit or need to cancel or reschedule your appointment, please contact [facility] [place] - A DEPT OF Eligha Bridegroom [facility] [phone]  and follow the prompts.  Office hours are 8:00 a.m. to 4:30 p.m. Monday - Friday. Please note that voicemails left after 4:00 p.m. may not be returned until the following business day.  We are closed weekends and major holidays. You have access to a nurse at all times for urgent questions. Please call the main number to the clinic [phone] and follow the prompts.  For any non-urgent questions, you may also contact your provider using MyChart. We now offer e-Visits for anyone 72 and older to request care online for non-urgent symptoms. For details visit [email or web address].   Also download the MyChart app! Go to the app store, search "MyChart", open the app, select [place], and log in with your MyChart username and password.

## 2024-05-28 ENCOUNTER — Encounter: Payer: Self-pay | Admitting: Oncology

## 2024-05-28 ENCOUNTER — Inpatient Hospital Stay

## 2024-05-28 ENCOUNTER — Inpatient Hospital Stay: Admitting: Oncology

## 2024-05-28 VITALS — BP 113/70 | HR 75 | Temp 96.0°F | Resp 18 | Wt 123.7 lb

## 2024-05-28 VITALS — BP 113/57 | HR 67 | Temp 96.6°F | Resp 18

## 2024-05-28 DIAGNOSIS — Z95828 Presence of other vascular implants and grafts: Secondary | ICD-10-CM

## 2024-05-28 DIAGNOSIS — C9310 Chronic myelomonocytic leukemia not having achieved remission: Secondary | ICD-10-CM

## 2024-05-28 LAB — COMPREHENSIVE METABOLIC PANEL WITH GFR
ALT: 8 U/L (ref 0–44)
AST: 20 U/L (ref 15–41)
Albumin: 4.3 g/dL (ref 3.5–5.0)
Alkaline Phosphatase: 60 U/L (ref 38–126)
Anion gap: 8 (ref 5–15)
BUN: 14 mg/dL (ref 8–23)
CO2: 29 mmol/L (ref 22–32)
Calcium: 9.2 mg/dL (ref 8.9–10.3)
Chloride: 102 mmol/L (ref 98–111)
Creatinine, Ser: 1 mg/dL (ref 0.44–1.00)
GFR, Estimated: 60 mL/min — ABNORMAL LOW (ref >60)
Glucose, Bld: 117 mg/dL — ABNORMAL HIGH (ref 70–99)
Potassium: 4.1 mmol/L (ref 3.5–5.1)
Sodium: 139 mmol/L (ref 135–145)
Total Bilirubin: 0.9 mg/dL (ref 0.0–1.2)
Total Protein: 6.7 g/dL (ref 6.5–8.1)

## 2024-05-28 LAB — CBC WITH DIFFERENTIAL/PLATELET
Abs Immature Granulocytes: 0.1 K/uL — ABNORMAL HIGH (ref 0.00–0.07)
Band Neutrophils: 2 %
Basophils Absolute: 0 K/uL (ref 0.0–0.1)
Basophils Relative: 0 %
Eosinophils Absolute: 0.1 K/uL (ref 0.0–0.5)
Eosinophils Relative: 5 %
HCT: 27.1 % — ABNORMAL LOW (ref 36.0–46.0)
Hemoglobin: 8.1 g/dL — ABNORMAL LOW (ref 12.0–15.0)
Lymphocytes Relative: 43 %
Lymphs Abs: 0.8 K/uL (ref 0.7–4.0)
MCH: 30 pg (ref 26.0–34.0)
MCHC: 29.9 g/dL — ABNORMAL LOW (ref 30.0–36.0)
MCV: 100.4 fL — ABNORMAL HIGH (ref 80.0–100.0)
Metamyelocytes Relative: 3 %
Monocytes Absolute: 0.1 K/uL (ref 0.1–1.0)
Monocytes Relative: 3 %
Myelocytes: 1 %
Neutro Abs: 0.9 K/uL — ABNORMAL LOW (ref 1.7–7.7)
Neutrophils Relative %: 43 %
Platelets: 61 K/uL — ABNORMAL LOW (ref 150–400)
RBC: 2.7 MIL/uL — ABNORMAL LOW (ref 3.87–5.11)
RDW: 18.8 % — ABNORMAL HIGH (ref 11.5–15.5)
WBC: 1.9 K/uL — ABNORMAL LOW (ref 4.0–10.5)
nRBC: 4.1 % — ABNORMAL HIGH (ref 0.0–0.2)

## 2024-05-28 LAB — MAGNESIUM: Magnesium: 2.4 mg/dL (ref 1.7–2.4)

## 2024-05-28 MED ORDER — SODIUM CHLORIDE 0.9 % IV SOLN: Freq: Once | INTRAVENOUS | Status: AC

## 2024-05-28 MED ORDER — PALONOSETRON HCL INJECTION 0.25 MG/5ML
0.2500 mg | Freq: Once | INTRAVENOUS | Status: AC
  Administered 2024-05-28: 0.25 mg via INTRAVENOUS
  Filled 2024-05-28: qty 5

## 2024-05-28 MED ORDER — SODIUM CHLORIDE 0.9 % IV SOLN
75.0000 mg/m2 | Freq: Once | INTRAVENOUS | Status: AC
  Administered 2024-05-28: 126 mg via INTRAVENOUS
  Filled 2024-05-28: qty 12.6

## 2024-05-28 MED FILL — Azacitidine For Inj 100 MG: INTRAMUSCULAR | Qty: 12.6 | Status: AC

## 2024-05-28 NOTE — Progress Notes (Signed)
 Patient presents for Vidaza  D6. Vital signs within parameters for treatment. ANC 0.9. Message sent to Dr. Davonna. Okay to treat.   Treatment given today per MD orders. Tolerated infusion without adverse affects. Vital signs stable. No complaints at this time. Discharged from clinic ambulatory in stable condition. Alert and oriented x 3. F/U with [facility] as scheduled.

## 2024-05-28 NOTE — Patient Instructions (Signed)
 [facility] [place] - [facility]  Discharge Instructions: Thank you for choosing [facility] to provide your oncology and hematology care.  If you have a lab appointment with the Cancer Center - please note that after April 8th, 2024, all labs will be drawn in the cancer center.  You do not have to check in or register with the main entrance as you have in the past but will complete your check-in in the cancer center.  Wear comfortable clothing and clothing appropriate for easy access to any Portacath or PICC line.   We strive to give you quality time with your provider. You may need to reschedule your appointment if you arrive late (15 or more minutes).  Arriving late affects you and other patients whose appointments are after yours.  Also, if you miss three or more appointments without notifying the office, you may be dismissed from the clinic at the provider's discretion.      For prescription refill requests, have your pharmacy contact our office and allow 72 hours for refills to be completed.    Today you received the following chemotherapy and/or immunotherapy agents Vidaza.  Azacitidine Injection What is this medication? AZACITIDINE (ay za SITE i deen) treats blood and bone marrow cancers. It works by slowing down the growth of cancer cells. This medicine may be used for other purposes; ask your health care provider or pharmacist if you have questions. COMMON BRAND NAME(S): Vidaza What should I tell my care team before I take this medication? They need to know if you have any of these conditions: Kidney disease Liver disease Low blood cell levels, such as low white cells, platelets, or red blood cells Low levels of albumin in the blood Low levels of bicarbonate in the blood An unusual or allergic reaction to azacitidine, mannitol, other medications, foods, dyes, or preservatives If you or your partner are pregnant or trying to get  pregnant Breast-feeding How should I use this medication? This medication is injected into a vein or under the skin. It is given by your care team in a hospital or clinic setting. Talk to your care team about the use of this medication in children. While it may be prescribed for children as young as 1 month for selected conditions, precautions do apply. Overdosage: If you think you have taken too much of this medicine contact a poison control center or emergency room at once. NOTE: This medicine is only for you. Do not share this medicine with others. What if I miss a dose? Keep appointments for follow-up doses. It is important not to miss your dose. Call your care team if you are unable to keep an appointment. What may interact with this medication? Interactions are not expected. This list may not describe all possible interactions. Give your health care provider a list of all the medicines, herbs, non-prescription drugs, or dietary supplements you use. Also tell them if you smoke, drink alcohol, or use illegal drugs. Some items may interact with your medicine. What should I watch for while using this medication? Your condition will be monitored carefully while you are receiving this medication. This medication may make you feel generally unwell. This is not uncommon as chemotherapy can affect healthy cells as well as cancer cells. Report any side effects. Continue your course of treatment even though you feel ill unless your care team tells you to stop. You may need blood work done while you are  taking this medication. Other product types may be available that contain the medication azacitidine. The injection and oral products should not be used in place of one another. Talk to your care team if you have questions. This medication can cause serious side effects. To reduce the risk, your care team may give you other medications to take before receiving this one. Be sure to follow the directions from  your care team. This medication may increase your risk of getting an infection. Call your care team for advice if you get a fever, chills, sore throat, or other symptoms of a cold or flu. Do not treat yourself. Try to avoid being around people who are sick. Avoid taking medications that contain aspirin, acetaminophen, ibuprofen, naproxen, or ketoprofen unless instructed by your care team. These medications may hide a fever. Be careful brushing or flossing your teeth or using a toothpick because you may get an infection or bleed more easily. If you have any dental work done, tell your dentist you are receiving this medication. Talk to your care team if you or your partner may be pregnant. Serious birth defects can occur if you take this medication during pregnancy and for 6 months after the last dose. You will need a negative pregnancy test before starting this medication. Contraception is recommended while taking this medication and for 6 months after the last dose. Your care team can help you find the option that works for you. If your partner can get pregnant, use a condom during sex while taking this medication and for 3 months after the last dose. Do not breastfeed while taking this medication and for 1 week after the last dose. This medication may cause infertility. Talk to your care team if you are concerned about your fertility. What side effects may I notice from receiving this medication? Side effects that you should report to your care team as soon as possible: Allergic reactions--skin rash, itching, hives, swelling of the face, lips, tongue, or throat Infection--fever, chills, cough, sore throat, wounds that don't heal, pain or trouble when passing urine, general feeling of discomfort or being unwell Kidney injury--decrease in the amount of urine, swelling of the ankles, hands, or feet Liver injury--right upper belly pain, loss of appetite, nausea, light-colored stool, dark yellow or Iyla Balzarini  urine, yellowing skin or eyes, unusual weakness or fatigue Low red blood cell level--unusual weakness or fatigue, dizziness, headache, trouble breathing Tumor lysis syndrome (TLS)--nausea, vomiting, diarrhea, decrease in the amount of urine, dark urine, unusual weakness or fatigue, confusion, muscle pain or cramps, fast or irregular heartbeat, joint pain Unusual bruising or bleeding Side effects that usually do not require medical attention (report to your care team if they continue or are bothersome): Constipation Diarrhea Nausea Pain, redness, or irritation at injection site Vomiting This list may not describe all possible side effects. Call your doctor for medical advice about side effects. You may report side effects to FDA at 1-800-FDA-1088. Where should I keep my medication? This medication is given in a hospital or clinic. It will not be stored at home. NOTE: This sheet is a summary. It may not cover all possible information. If you have questions about this medicine, talk to your doctor, pharmacist, or health care provider.  2024 Elsevier/Gold Standard (2023-03-07 00:00:00)       To help prevent nausea and vomiting after your treatment, we encourage you to take your nausea medication as directed.  BELOW ARE SYMPTOMS THAT SHOULD BE REPORTED IMMEDIATELY: *FEVER GREATER THAN 100.4  F (38 C) OR HIGHER *CHILLS OR SWEATING *NAUSEA AND VOMITING THAT IS NOT CONTROLLED WITH YOUR NAUSEA MEDICATION *UNUSUAL SHORTNESS OF BREATH *UNUSUAL BRUISING OR BLEEDING *URINARY PROBLEMS (pain or burning when urinating, or frequent urination) *BOWEL PROBLEMS (unusual diarrhea, constipation, pain near the anus) TENDERNESS IN MOUTH AND THROAT WITH OR WITHOUT PRESENCE OF ULCERS (sore throat, sores in mouth, or a toothache) UNUSUAL RASH, SWELLING OR PAIN  UNUSUAL VAGINAL DISCHARGE OR ITCHING   Items with * indicate a potential emergency and should be followed up as soon as possible or go to the Emergency  Department if any problems should occur.  Please show the CHEMOTHERAPY ALERT CARD or IMMUNOTHERAPY ALERT CARD at check-in to the Emergency Department and triage nurse.  Should you have questions after your visit or need to cancel or reschedule your appointment, please contact [facility] CANCER CTR [place] - A DEPT OF Eligha Bridegroom [facility] [phone]  and follow the prompts.  Office hours are 8:00 a.m. to 4:30 p.m. Monday - Friday. Please note that voicemails left after 4:00 p.m. may not be returned until the following business day.  We are closed weekends and major holidays. You have access to a nurse at all times for urgent questions. Please call the main number to the clinic [phone] and follow the prompts.  For any non-urgent questions, you may also contact your provider using MyChart. We now offer e-Visits for anyone 88 and older to request care online for non-urgent symptoms. For details visit [email or web address].   Also download the MyChart app! Go to the app store, search "MyChart", open the app, select [place], and log in with your MyChart username and password.

## 2024-05-29 ENCOUNTER — Inpatient Hospital Stay

## 2024-05-29 VITALS — BP 96/75 | HR 80 | Temp 97.5°F | Resp 18

## 2024-05-29 DIAGNOSIS — Z95828 Presence of other vascular implants and grafts: Secondary | ICD-10-CM

## 2024-05-29 DIAGNOSIS — C9310 Chronic myelomonocytic leukemia not having achieved remission: Secondary | ICD-10-CM | POA: Diagnosis not present

## 2024-05-29 MED ORDER — SODIUM CHLORIDE 0.9 % IV SOLN: Freq: Once | INTRAVENOUS | Status: AC

## 2024-05-29 MED ORDER — SODIUM CHLORIDE 0.9 % IV SOLN
75.0000 mg/m2 | Freq: Once | INTRAVENOUS | Status: AC
  Administered 2024-05-29: 126 mg via INTRAVENOUS
  Filled 2024-05-29: qty 12.6

## 2024-05-29 NOTE — Progress Notes (Signed)
 Patient presents today for D6 Vidaza . Vital signs are within parameters for treatment. NO side effects related to last treatment.

## 2024-05-29 NOTE — Patient Instructions (Signed)
 [facility] [place] - [facility]  Discharge Instructions: Thank you for choosing [facility] to provide your oncology and hematology care.  If you have a lab appointment with the Cancer Center - please note that after April 8th, 2024, all labs will be drawn in the cancer center.  You do not have to check in or register with the main entrance as you have in the past but will complete your check-in in the cancer center.  Wear comfortable clothing and clothing appropriate for easy access to any Portacath or PICC line.   We strive to give you quality time with your provider. You may need to reschedule your appointment if you arrive late (15 or more minutes).  Arriving late affects you and other patients whose appointments are after yours.  Also, if you miss three or more appointments without notifying the office, you may be dismissed from the clinic at the provider's discretion.      For prescription refill requests, have your pharmacy contact our office and allow 72 hours for refills to be completed.    Today you received the following chemotherapy and/or immunotherapy agents Vidaza.  Azacitidine Injection What is this medication? AZACITIDINE (ay za SITE i deen) treats blood and bone marrow cancers. It works by slowing down the growth of cancer cells. This medicine may be used for other purposes; ask your health care provider or pharmacist if you have questions. COMMON BRAND NAME(S): Vidaza What should I tell my care team before I take this medication? They need to know if you have any of these conditions: Kidney disease Liver disease Low blood cell levels, such as low white cells, platelets, or red blood cells Low levels of albumin in the blood Low levels of bicarbonate in the blood An unusual or allergic reaction to azacitidine, mannitol, other medications, foods, dyes, or preservatives If you or your partner are pregnant or trying to get  pregnant Breast-feeding How should I use this medication? This medication is injected into a vein or under the skin. It is given by your care team in a hospital or clinic setting. Talk to your care team about the use of this medication in children. While it may be prescribed for children as young as 1 month for selected conditions, precautions do apply. Overdosage: If you think you have taken too much of this medicine contact a poison control center or emergency room at once. NOTE: This medicine is only for you. Do not share this medicine with others. What if I miss a dose? Keep appointments for follow-up doses. It is important not to miss your dose. Call your care team if you are unable to keep an appointment. What may interact with this medication? Interactions are not expected. This list may not describe all possible interactions. Give your health care provider a list of all the medicines, herbs, non-prescription drugs, or dietary supplements you use. Also tell them if you smoke, drink alcohol, or use illegal drugs. Some items may interact with your medicine. What should I watch for while using this medication? Your condition will be monitored carefully while you are receiving this medication. This medication may make you feel generally unwell. This is not uncommon as chemotherapy can affect healthy cells as well as cancer cells. Report any side effects. Continue your course of treatment even though you feel ill unless your care team tells you to stop. You may need blood work done while you are  taking this medication. Other product types may be available that contain the medication azacitidine. The injection and oral products should not be used in place of one another. Talk to your care team if you have questions. This medication can cause serious side effects. To reduce the risk, your care team may give you other medications to take before receiving this one. Be sure to follow the directions from  your care team. This medication may increase your risk of getting an infection. Call your care team for advice if you get a fever, chills, sore throat, or other symptoms of a cold or flu. Do not treat yourself. Try to avoid being around people who are sick. Avoid taking medications that contain aspirin, acetaminophen, ibuprofen, naproxen, or ketoprofen unless instructed by your care team. These medications may hide a fever. Be careful brushing or flossing your teeth or using a toothpick because you may get an infection or bleed more easily. If you have any dental work done, tell your dentist you are receiving this medication. Talk to your care team if you or your partner may be pregnant. Serious birth defects can occur if you take this medication during pregnancy and for 6 months after the last dose. You will need a negative pregnancy test before starting this medication. Contraception is recommended while taking this medication and for 6 months after the last dose. Your care team can help you find the option that works for you. If your partner can get pregnant, use a condom during sex while taking this medication and for 3 months after the last dose. Do not breastfeed while taking this medication and for 1 week after the last dose. This medication may cause infertility. Talk to your care team if you are concerned about your fertility. What side effects may I notice from receiving this medication? Side effects that you should report to your care team as soon as possible: Allergic reactions--skin rash, itching, hives, swelling of the face, lips, tongue, or throat Infection--fever, chills, cough, sore throat, wounds that don't heal, pain or trouble when passing urine, general feeling of discomfort or being unwell Kidney injury--decrease in the amount of urine, swelling of the ankles, hands, or feet Liver injury--right upper belly pain, loss of appetite, nausea, light-colored stool, dark yellow or Iyla Balzarini  urine, yellowing skin or eyes, unusual weakness or fatigue Low red blood cell level--unusual weakness or fatigue, dizziness, headache, trouble breathing Tumor lysis syndrome (TLS)--nausea, vomiting, diarrhea, decrease in the amount of urine, dark urine, unusual weakness or fatigue, confusion, muscle pain or cramps, fast or irregular heartbeat, joint pain Unusual bruising or bleeding Side effects that usually do not require medical attention (report to your care team if they continue or are bothersome): Constipation Diarrhea Nausea Pain, redness, or irritation at injection site Vomiting This list may not describe all possible side effects. Call your doctor for medical advice about side effects. You may report side effects to FDA at 1-800-FDA-1088. Where should I keep my medication? This medication is given in a hospital or clinic. It will not be stored at home. NOTE: This sheet is a summary. It may not cover all possible information. If you have questions about this medicine, talk to your doctor, pharmacist, or health care provider.  2024 Elsevier/Gold Standard (2023-03-07 00:00:00)       To help prevent nausea and vomiting after your treatment, we encourage you to take your nausea medication as directed.  BELOW ARE SYMPTOMS THAT SHOULD BE REPORTED IMMEDIATELY: *FEVER GREATER THAN 100.4  F (38 C) OR HIGHER *CHILLS OR SWEATING *NAUSEA AND VOMITING THAT IS NOT CONTROLLED WITH YOUR NAUSEA MEDICATION *UNUSUAL SHORTNESS OF BREATH *UNUSUAL BRUISING OR BLEEDING *URINARY PROBLEMS (pain or burning when urinating, or frequent urination) *BOWEL PROBLEMS (unusual diarrhea, constipation, pain near the anus) TENDERNESS IN MOUTH AND THROAT WITH OR WITHOUT PRESENCE OF ULCERS (sore throat, sores in mouth, or a toothache) UNUSUAL RASH, SWELLING OR PAIN  UNUSUAL VAGINAL DISCHARGE OR ITCHING   Items with * indicate a potential emergency and should be followed up as soon as possible or go to the Emergency  Department if any problems should occur.  Please show the CHEMOTHERAPY ALERT CARD or IMMUNOTHERAPY ALERT CARD at check-in to the Emergency Department and triage nurse.  Should you have questions after your visit or need to cancel or reschedule your appointment, please contact [facility] CANCER CTR [place] - A DEPT OF Eligha Bridegroom [facility] [phone]  and follow the prompts.  Office hours are 8:00 a.m. to 4:30 p.m. Monday - Friday. Please note that voicemails left after 4:00 p.m. may not be returned until the following business day.  We are closed weekends and major holidays. You have access to a nurse at all times for urgent questions. Please call the main number to the clinic [phone] and follow the prompts.  For any non-urgent questions, you may also contact your provider using MyChart. We now offer e-Visits for anyone 88 and older to request care online for non-urgent symptoms. For details visit [email or web address].   Also download the MyChart app! Go to the app store, search "MyChart", open the app, select [place], and log in with your MyChart username and password.

## 2024-05-30 ENCOUNTER — Inpatient Hospital Stay

## 2024-05-31 ENCOUNTER — Inpatient Hospital Stay

## 2024-05-31 DIAGNOSIS — C9310 Chronic myelomonocytic leukemia not having achieved remission: Secondary | ICD-10-CM | POA: Diagnosis not present

## 2024-05-31 DIAGNOSIS — Z01818 Encounter for other preprocedural examination: Secondary | ICD-10-CM | POA: Diagnosis not present

## 2024-05-31 DIAGNOSIS — I088 Other rheumatic multiple valve diseases: Secondary | ICD-10-CM | POA: Diagnosis not present

## 2024-05-31 DIAGNOSIS — Z7682 Awaiting organ transplant status: Secondary | ICD-10-CM | POA: Diagnosis not present

## 2024-05-31 DIAGNOSIS — R942 Abnormal results of pulmonary function studies: Secondary | ICD-10-CM | POA: Diagnosis not present

## 2024-06-01 ENCOUNTER — Inpatient Hospital Stay

## 2024-06-01 NOTE — Progress Notes (Signed)
 Chiquita a nurse from [facility] with Dr [name] called wanting to send lab orders to be done here next week. Twyla Chiquita the fax number to fax us  the orders and will inform Isaiah Monday so patient can get scheduled.

## 2024-06-03 DIAGNOSIS — Z01818 Encounter for other preprocedural examination: Secondary | ICD-10-CM | POA: Diagnosis not present

## 2024-06-03 DIAGNOSIS — C9310 Chronic myelomonocytic leukemia not having achieved remission: Secondary | ICD-10-CM | POA: Diagnosis not present

## 2024-06-03 DIAGNOSIS — Z7682 Awaiting organ transplant status: Secondary | ICD-10-CM | POA: Diagnosis not present

## 2024-06-04 ENCOUNTER — Inpatient Hospital Stay

## 2024-06-04 VITALS — BP 107/72 | HR 91 | Temp 96.3°F | Resp 20 | Wt 122.4 lb

## 2024-06-04 DIAGNOSIS — C9310 Chronic myelomonocytic leukemia not having achieved remission: Secondary | ICD-10-CM

## 2024-06-04 DIAGNOSIS — Z95828 Presence of other vascular implants and grafts: Secondary | ICD-10-CM

## 2024-06-04 LAB — CBC WITH DIFFERENTIAL/PLATELET
Abs Immature Granulocytes: 0.18 K/uL — ABNORMAL HIGH (ref 0.00–0.07)
Basophils Absolute: 0 K/uL (ref 0.0–0.1)
Basophils Relative: 1 %
Eosinophils Absolute: 0 K/uL (ref 0.0–0.5)
Eosinophils Relative: 1 %
HCT: 24.3 % — ABNORMAL LOW (ref 36.0–46.0)
Hemoglobin: 7.5 g/dL — ABNORMAL LOW (ref 12.0–15.0)
Immature Granulocytes: 8 %
Lymphocytes Relative: 33 %
Lymphs Abs: 0.8 K/uL (ref 0.7–4.0)
MCH: 31 pg (ref 26.0–34.0)
MCHC: 30.9 g/dL (ref 30.0–36.0)
MCV: 100.4 fL — ABNORMAL HIGH (ref 80.0–100.0)
Monocytes Absolute: 0.2 K/uL (ref 0.1–1.0)
Monocytes Relative: 8 %
Neutro Abs: 1.2 K/uL — ABNORMAL LOW (ref 1.7–7.7)
Neutrophils Relative %: 49 %
Platelets: 66 K/uL — ABNORMAL LOW (ref 150–400)
RBC: 2.42 MIL/uL — ABNORMAL LOW (ref 3.87–5.11)
RDW: 19.7 % — ABNORMAL HIGH (ref 11.5–15.5)
WBC: 2.4 K/uL — ABNORMAL LOW (ref 4.0–10.5)
nRBC: 6.8 % — ABNORMAL HIGH (ref 0.0–0.2)

## 2024-06-04 LAB — COMPREHENSIVE METABOLIC PANEL WITH GFR
ALT: 9 U/L (ref 0–44)
AST: 22 U/L (ref 15–41)
Albumin: 4.4 g/dL (ref 3.5–5.0)
Alkaline Phosphatase: 58 U/L (ref 38–126)
Anion gap: 9 (ref 5–15)
BUN: 12 mg/dL (ref 8–23)
CO2: 29 mmol/L (ref 22–32)
Calcium: 9.3 mg/dL (ref 8.9–10.3)
Chloride: 101 mmol/L (ref 98–111)
Creatinine, Ser: 1 mg/dL (ref 0.44–1.00)
GFR, Estimated: 59 mL/min — ABNORMAL LOW (ref >60)
Glucose, Bld: 113 mg/dL — ABNORMAL HIGH (ref 70–99)
Potassium: 4.3 mmol/L (ref 3.5–5.1)
Sodium: 138 mmol/L (ref 135–145)
Total Bilirubin: 1.1 mg/dL (ref 0.0–1.2)
Total Protein: 6.8 g/dL (ref 6.5–8.1)

## 2024-06-04 LAB — SAMPLE TO BLOOD BANK

## 2024-06-04 NOTE — Progress Notes (Signed)
 Patients port flushed without difficulty.  Good blood return noted with no bruising or swelling noted at site.  Labs drawn per orders.  VSS with discharge and left in satisfactory condition with no s/s of distress noted. All follow ups as scheduled.       Kierre Deines

## 2024-06-05 ENCOUNTER — Inpatient Hospital Stay

## 2024-06-07 ENCOUNTER — Other Ambulatory Visit: Payer: Self-pay

## 2024-06-07 ENCOUNTER — Other Ambulatory Visit (HOSPITAL_COMMUNITY): Payer: Self-pay

## 2024-06-07 NOTE — Progress Notes (Signed)
 Specialty Pharmacy Refill Coordination Note  Arieal NAILEAH KARG is a 73 y.o. female assessed today regarding refills of clinic administered specialty medication(s) Tezepelumab -ekko (Tezspire )   Clinic requested Courier to Provider Office   Delivery date: 06/12/24   Verified address: [facility] [address] [place] [address]   Medication will be filled on: 06/11/24

## 2024-06-11 ENCOUNTER — Other Ambulatory Visit: Payer: Self-pay

## 2024-06-11 ENCOUNTER — Telehealth: Payer: Self-pay

## 2024-06-11 NOTE — Telephone Encounter (Signed)
 Patient called to cancel the next Tezspire  injection scheduled for 06/18/24. Patient stated that she was scheduled to have stem cell transplant and cannot get the injection at the moment.

## 2024-06-12 NOTE — Telephone Encounter (Signed)
 Called and spoke with patient, she states that she will have to stay in the hospital until the first of the year, then she will have to be away from public until flu season is over, about the end of March.

## 2024-06-18 ENCOUNTER — Ambulatory Visit

## 2024-06-18 ENCOUNTER — Other Ambulatory Visit: Payer: Self-pay | Admitting: *Deleted

## 2024-06-18 ENCOUNTER — Other Ambulatory Visit: Payer: Self-pay | Admitting: Allergy

## 2024-06-18 DIAGNOSIS — G4700 Insomnia, unspecified: Secondary | ICD-10-CM

## 2024-06-18 MED ORDER — TRAZODONE HCL 50 MG PO TABS: 50.0000 mg | ORAL_TABLET | Freq: Every day | ORAL | 3 refills | Status: AC

## 2024-06-20 ENCOUNTER — Other Ambulatory Visit: Payer: Self-pay

## 2024-06-20 NOTE — Progress Notes (Signed)
 Per telephone encounter on 12/1, pt will be admitted to hospital for stem cell transplant and will not be able to come back into the office until after flu season (end of March). Outreaches paused until patient clear to restart medication.

## 2024-07-03 ENCOUNTER — Other Ambulatory Visit: Payer: Self-pay | Admitting: *Deleted

## 2024-07-03 DIAGNOSIS — C9310 Chronic myelomonocytic leukemia not having achieved remission: Secondary | ICD-10-CM

## 2024-07-04 ENCOUNTER — Inpatient Hospital Stay: Attending: Hematology

## 2024-07-04 DIAGNOSIS — D696 Thrombocytopenia, unspecified: Secondary | ICD-10-CM | POA: Insufficient documentation

## 2024-07-04 DIAGNOSIS — Z79899 Other long term (current) drug therapy: Secondary | ICD-10-CM | POA: Insufficient documentation

## 2024-07-04 DIAGNOSIS — C9310 Chronic myelomonocytic leukemia not having achieved remission: Secondary | ICD-10-CM | POA: Insufficient documentation

## 2024-07-04 LAB — SAMPLE TO BLOOD BANK

## 2024-07-04 LAB — CBC WITH DIFFERENTIAL/PLATELET
Abs Immature Granulocytes: 0.2 K/uL — ABNORMAL HIGH (ref 0.00–0.07)
Basophils Absolute: 0 K/uL (ref 0.0–0.1)
Basophils Relative: 0 %
Eosinophils Absolute: 0.1 K/uL (ref 0.0–0.5)
Eosinophils Relative: 3 %
HCT: 28.2 % — ABNORMAL LOW (ref 36.0–46.0)
Hemoglobin: 8.5 g/dL — ABNORMAL LOW (ref 12.0–15.0)
Lymphocytes Relative: 23 %
Lymphs Abs: 1.1 K/uL (ref 0.7–4.0)
MCH: 29.9 pg (ref 26.0–34.0)
MCHC: 30.1 g/dL (ref 30.0–36.0)
MCV: 99.3 fL (ref 80.0–100.0)
Metamyelocytes Relative: 2 %
Monocytes Absolute: 0.4 K/uL (ref 0.1–1.0)
Monocytes Relative: 8 %
Myelocytes: 2 %
Neutro Abs: 2.9 K/uL (ref 1.7–7.7)
Neutrophils Relative %: 62 %
Platelets: 129 K/uL — ABNORMAL LOW (ref 150–400)
RBC: 2.84 MIL/uL — ABNORMAL LOW (ref 3.87–5.11)
RDW: 19.6 % — ABNORMAL HIGH (ref 11.5–15.5)
Smear Review: NORMAL
WBC: 4.7 K/uL (ref 4.0–10.5)
nRBC: 3.2 % — ABNORMAL HIGH (ref 0.0–0.2)

## 2024-07-04 LAB — COMPREHENSIVE METABOLIC PANEL WITH GFR
ALT: 11 U/L (ref 0–44)
AST: 27 U/L (ref 15–41)
Albumin: 4.5 g/dL (ref 3.5–5.0)
Alkaline Phosphatase: 71 U/L (ref 38–126)
Anion gap: 13 (ref 5–15)
BUN: 16 mg/dL (ref 8–23)
CO2: 23 mmol/L (ref 22–32)
Calcium: 9.6 mg/dL (ref 8.9–10.3)
Chloride: 102 mmol/L (ref 98–111)
Creatinine, Ser: 0.97 mg/dL (ref 0.44–1.00)
GFR, Estimated: >60 mL/min (ref >60)
Glucose, Bld: 126 mg/dL — ABNORMAL HIGH (ref 70–99)
Potassium: 4.1 mmol/L (ref 3.5–5.1)
Sodium: 139 mmol/L (ref 135–145)
Total Bilirubin: 0.9 mg/dL (ref 0.0–1.2)
Total Protein: 7.3 g/dL (ref 6.5–8.1)

## 2024-07-04 NOTE — Progress Notes (Signed)
 Patients port flushed without difficulty.  Good blood return noted with no bruising or swelling noted at site.  Labs drawn per orders.  VSS with discharge and left in satisfactory condition with no s/s of distress noted. All follow ups as scheduled.       [name]

## 2024-07-07 ENCOUNTER — Other Ambulatory Visit: Payer: Self-pay | Admitting: Allergy

## 2024-07-09 ENCOUNTER — Other Ambulatory Visit: Payer: Self-pay | Admitting: *Deleted

## 2024-07-09 MED ORDER — FOLIC ACID 1 MG PO TABS: 1.0000 mg | ORAL_TABLET | Freq: Every day | ORAL | 5 refills | Status: AC

## 2024-07-16 ENCOUNTER — Encounter: Payer: Self-pay | Admitting: *Deleted

## 2024-08-13 NOTE — Discharge Summary (Signed)
 ------------------------------------------------------------------------------- Attestation signed by Ronal Landry Muscat, MD at 08/14/2024  9:19 PM ATTENDING ATTESTATION:  Pt seen and examined with Schuyler Dade, PA. I reviewed the hospital course, reviewed labs, and discussed the assessment and plan with [name] and her supportive son.  She is appropriate for discharge home today.  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  She has engrafted her neutrophils and is no longer requiring daily platelet transfusion support.  She has no uncontrolled symptoms today.  She has close clinic follow-up on Thursday.  I have spent over 30 minutes today coordinating discharge to include medication review, discharge counseling, and follow up arrangements.   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  Her principal problem was management of CMML-2 with high-risk molecular features, for which she underwent a preparative regimen of dose-reduced fludarabine and melphalan, followed by infusion of peripheral blood stem cells from a 10/10 matched unrelated donor on 07/18/2024. The transplant course was guided by daily laboratory monitoring, including CBC, comprehensive metabolic  panel, and surveillance for infectious complications, as well as multidisciplinary input from hematology/oncology, pharmacy, nutrition, physical therapy, and psychosocial support teams. She received graft-versus-host disease prophylaxis with post-transplant cyclophosphamide, tacrolimus, and mycophenolate mofetil, as well as infection prophylaxis with acyclovir, fluconazole , and levofloxacin.  During the peri-transplant period, she developed neutropenic fever and diarrhea, with norovirus detected on stool PCR and negative blood and urine cultures; she was treated with IV piperacillin-tazobactam and nitazoxanide, both completed as 7-day courses. She experienced significant weight gain and volume overload around post-transplant Cytoxan, managed with IV furosemide, resulting in gradual improvement in weight and edema; diuresis was held intermittently in the setting of diarrhea and net negative fluid balance. Imaging revealed new hepatomegaly, periportal edema, and colonic wall thickening, raising concern for veno-occlusive disease and nonspecific colitis; liver function tests remained within normal limits, and serial liver Doppler ultrasounds and abdominal CT were performed, with subsequent imaging showing hepatosplenomegaly without reversal of flow and low suspicion for veno-occlusive disease.  She developed severe thrombocytopenia and anemia requiring daily transfusion support, and remained profoundly neutropenic throughout the course, with G-CSF administered daily through engraftment and discontinued on 1/26 after neutrophil recovery. She also experienced edema of the lower extremities and abdomen, as well as metabolic derangements including non-anion gap metabolic acidosis, hypokalemia, hypophosphatemia, hypocalcemia, and hypomagnesemia, all managed with electrolyte repletion and supportive care. Bilateral lower extremity pain with mildly elevated CK was noted, but lower extremity ultrasound was negative for DVT  and pain improved with diuresis and symptomatic management; left lower extremity edema was greater than right on 1/25, and DVT studies were negative as of 1/26.  Her gastrointestinal course was complicated by persistent diarrhea, initially attributed to norovirus, and later by mucosal barrier injury with colitis, gastritis, and duodenitis seen on CT during a subsequent episode of neutropenic fever. She developed an ileus, and loperamide was discontinued at that time; the ileus subsequently resolved, and loperamide was resumed as needed for ongoing  diarrhea, but limited use. Stool studies for other infectious etiologies, including C. difficile and a repeat GI PCR, were negative as of 1/25. She experienced poor oral intake and weight loss, meeting criteria for severe protein-calorie malnutrition with moderate muscle wasting and mild fat wasting on nutrition-focused physical exam; a Dobbhoff tube was placed for enteral nutrition but was later removed due to clogging and concern for worsening diarrhea, after which she resumed oral intake with ongoing dietitian support and oral nutrition supplements. This improved over the course of the admission.  She had episodes of supraventricular tachycardia and palpitations, which were monitored with telemetry and managed with correction of electrolyte abnormalities; these resolved without further intervention. She also developed oral mucositis with ulcerations. Just as above, she had a dob-hoff tube for a limited time to supplement nutrition.   During the later phase of admission, she developed a new episode of neutropenic fever on 1/16, with blood cultures growing Staphylococcus epidermidis in one bottle, considered contaminant versus MSSA, and was treated with IV piperacillin-tazobactam for a total of 7 days, with subsequent blood cultures negative and fever curve resolved by 1/22; she remained afebrile thereafter. She also developed dysuria on 1/18, but urinalysis and  urine culture were negative and symptoms resolved.  She achieved neutrophil engraftment on 1/23, with ANC rising to 600 and subsequently to 1600 by 1/25; G-CSF was continued through engraftment and discontinued on 1/26. Transfusion support was closely monitored due to risk of rapid hemolysis as she approached engraftment, with hemoglobin checked every 6 hours and transfusion threshold set at 8.0; hemoglobin remained stable and no evidence of hemolysis was observed.  Additional issues during admission included situational depressive and anxious symptoms, for which she received ongoing psychosocial support and continuation of home trazodone  and fluoxetine; she was also monitored for delirium and received physical and occupational therapy to maintain mobility and prevent functional decline. She demonstrated progressive decline in functional mobility and activity tolerance, requiring close supervision and use of a rolling walker for ambulation, with outpatient therapy recommended at discharge; her AMPAC mobility score improved to 21/24 by 1/22 and her AMPAC ADL score was 23/24.  As of 1/26, she reported improvement in bowel movements, denied abdominal pain, and was eating and drinking fairly well; lower extremity edema persisted but was improving, and she remained afebrile with no new infectious complications. She continued to meet criteria for severe protein-calorie malnutrition, with ongoing nutrition support and education.  Discharge planning included education for the patient and her son regarding post-transplant care, infection prevention, GVHD monitoring, and activity precautions, with written materials provided and teach-back confirmed. Outpatient physical therapy and use of a rolling walker were recommended for continued rehabilitation, with durable medical equipment coordination completed prior to discharge. She was discharged in stable condition, with ANC recovery, stable hemoglobin, and no acute  issues. Ongoing management recommendations included close outpatient monitoring of hematologic parameters, infection prophylaxis, nutrition support, and rehabilitation services.  Current Status: NAEON overnight and afebrile. Day +27. A few loose stools yesterday. Eating and drinking well. No nausea or vomiting. Swelling is improving. No other new concerns.   Physical Exam: BP 132/81 (BP Location: Right arm, Patient Position: Lying)   Pulse 85   Temp 98.1 F (36.7 C) (Oral)   Resp 18   Ht 1.545 m (5' 0.83)   Wt 57.4 kg (126 lb 9.6 oz)   SpO2 98%   BMI 24.06 kg/m  GENERAL: [name] is a 74 y.o. well developed, well nourished female in no acute distress,  she is alert and oriented x 3 and fully ambulatory. HEENT: Nares and oropharynx are clear, no erythema or exudate.  Mucous membranes are pink and moist.  No scleral icterus. NECK: Supple. CARDIOVASCULAR: Regular rate and rhythm without any murmurs, rubs or gallops. LUNGS:  Clear to auscultation bilaterally without wheezes, rhonchi or crackles. ABDOMEN: Abdomen is soft, non-tender, nondistended. No hepatosplenomegaly or masses appreciated. EXTREMITIES: No edema. SKIN:  No new rashes or bruises observed. NEUROLOGIC: No focal deficits PSYCH: Pleasant appropriate affect and normal thought content.    Results: Recent Results (from the past 24 hours)  Comprehensive Metabolic Panel   Collection Time: 08/14/24  4:36 AM  Result Value Ref Range   Sodium 137 136 - 145 mmol/L   Potassium 3.6 3.5 - 5.1 mmol/L   Chloride 101 98 - 107 mmol/L   CO2 30 21 - 31 mmol/L   Anion Gap 6 6 - 14 mmol/L   Glucose, Random 106 (H) 70 - 99 mg/dL   Blood Urea Nitrogen (BUN) 6 (L) 7 - 25 mg/dL   Creatinine 9.31 9.39 - 1.20 mg/dL   eGFR >09 >40 fO/fpw/8.26f7   Albumin 3.3 (L) 3.5 - 5.7 g/dL   Total Protein 4.7 (L) 6.4 - 8.9 g/dL   Bilirubin, Total 0.7 0.3 - 1.0 mg/dL   Alkaline Phosphatase (ALP) 69 34 - 104 U/L   Aspartate Aminotransferase (AST) 14 13 -  39 U/L   Alanine Aminotransferase (ALT) 9 7 - 52 U/L   Calcium 8.7 8.6 - 10.3 mg/dL   BUN/Creatinine Ratio     Corrected Calcium 9.3 mg/dL  Magnesium   Collection Time: 08/14/24  4:36 AM  Result Value Ref Range   Magnesium 1.6 (L) 1.9 - 2.7 mg/dL  Phosphorus   Collection Time: 08/14/24  4:36 AM  Result Value Ref Range   Phosphorus 2.9 2.5 - 5.0 mg/dL  Ionized Calcium, Whole Blood   Collection Time: 08/14/24  4:36 AM  Result Value Ref Range   Ionized Calcium 1.26 1.15 - 1.33 mmol/L  CBC with Differential   Collection Time: 08/14/24  4:37 AM  Result Value Ref Range   WBC 2.80 (L) 4.40 - 11.00 10*3/uL   RBC 3.28 (L) 4.10 - 5.10 10*6/uL   Hemoglobin 9.8 (L) 12.3 - 15.3 g/dL   Hematocrit 72.1 (L) 64.0 - 44.6 %   Mean Corpuscular Volume (MCV) 84.8 80.0 - 96.0 fL   Mean Corpuscular Hemoglobin (MCH) 29.8 27.5 - 33.2 pg   Mean Corpuscular Hemoglobin Conc (MCHC) 35.1 33.0 - 37.0 g/dL   Red Cell Distribution Width (RDW) 16.3 12.3 - 17.0 %   Platelet Count (PLT) 10 (LL) 150 - 450 10*3/uL   Mean Platelet Volume (MPV) 10.2 6.8 - 10.2 fL   Neutrophils % 85 %   Lymphocytes % 2 %   Monocytes % 10 %   Eosinophils % 0 %   Basophils % 1 %   Bands % 1 %   Myelocyte % 1 %   Neutrophil Absolute (Man Diff) 2.40 1.80 - 7.80 10*3/uL   Lymphocytes Absolute (Man Diff) 0.10 (L) 1.00 - 4.80 10*3/uL   Monocytes Absolute (Man Diff) 0.30 0.00 - 0.80 10*3/uL   Eosinophils Absolute (Man Diff) 0.00 0.00 - 0.50 10*3/uL   Basophils Absolute (Man Diff) 0.00 0.00 - 0.20 10*3/uL   Bands Absolute (Man Diff) 0.00 10*3/uL   Myelocytes Absolute (Man Diff) 0.00 10*3/uL   RBC & PLT Morphology Reviewed    Polychromasia 1+   Prepare Platelets Irradiated :  1 Units   Collection Time: 08/14/24  6:56 AM  Result Value Ref Range   Unit Number [id number]    Component Type PATH RED PLT 1 CONT    Blood Unit Division 00    Dispense Status ISSUED    Unit Transfusion Status OK TO TRANSFUSE    Blood Issue/Date Time  [id number]    Blood Product Code Z1658C99    Unit ABO/Rh B POS    Blood Type 7300    Product Expiration Date 797398707640   Prepare Platelets Irradiated : 1 Units   Collection Time: 08/14/24  9:35 AM  Result Value Ref Range   Unit Number [id number]    Component Type PATH RED PLT 2 CONT    Blood Unit Division 00    Dispense Status ISSUED    Unit Transfusion Status OK TO TRANSFUSE    Blood Issue/Date Time 797398728957    Blood Product Code Z1657C99    Unit ABO/Rh B POS    Blood Type 7300    Product Expiration Date 797398717640       Discharge Medications     PAUSE taking these medications      Sig Disp Refill Start End  fluticasone  propionate 50 mcg/spray nasal spray Wait to take this until your doctor or other care provider tells you to start again. Commonly known as: FLONASE   Administer 1 spray into each nostril 2 (two) times a day as needed.   0     Tezspire  210 mg/1.91 mL (110 mg/mL) subcutaneous pen injector Wait to take this until your doctor or other care provider tells you to start again. Generic drug: tezepelumab -ekko  Inject 210 mg under the skin every 28 days.   0         New Medications      Sig Disp Refill Start End  acyclovir 800 mg tablet Commonly known as: ZOVIRAX  Take 1 tablet (800 mg total) by mouth 2 (two) times a day.  60 tablet  11     dapsone 100 mg tablet  Take 1 tablet (100 mg total) by mouth daily. Begin on Day +30 (08/17/24).  30 tablet  5     fluconazole  200 mg tablet Commonly known as: DIFLUCAN   Take 2 tablets (400 mg total) by mouth daily.  60 tablet  5     loperamide 2 mg capsule Commonly known as: IMODIUM  Take 1 capsule (2 mg total) by mouth 4 (four) times a day as needed for diarrhea.  30 capsule  0     Mg-Plus-Protein 133 mg Tab tablet Generic drug: magnesium oxide-amino acid chelate  Take 1 tablet (133 mg total) by mouth 3 (three) times a day.  100 tablet  3     mycophenolate 500 mg tablet Commonly known  as: CELLCEPT  Take 2 tablets (1,000 mg total) by mouth 2 (two) times a day. Take until Day +35 (08/22/24).  34 tablet  0     ondansetron  8 mg tablet Commonly known as: ZOFRAN   Take 1 tablet (8 mg total) by mouth every 8 (eight) hours as needed for nausea or vomiting.  60 tablet  0     One Daily Multivitamin Tab tablet Generic drug: multivitamin  Take 1 tablet by mouth daily.  100 tablet  1     penicillin v potassium 500 mg tablet Commonly known as: VEETID  Take 1 tablet (500 mg total) by mouth 2 (two) times a day.  60 tablet  5  prochlorperazine  10 mg tablet Commonly known as: COMPAZINE   Take 1 tablet (10 mg total) by mouth every 6 (six) hours as needed for nausea or vomiting.  20 tablet  1     tacrolimus 0.5 mg capsule Commonly known as: PROGRAF  Take 3 capsules (1.5 mg total) by mouth 2 (two) times a day. Adjust as directed in clinic.  180 capsule  2     ursodiol 300 mg capsule Commonly known as: ACTIGALL  Take 1 capsule (300 mg total) by mouth 2 (two) times a day. Take until Day +90 (10/16/24)  180 capsule  0         Modified Medications      Sig Disp Refill Start End  ipratropium 42 mcg (0.06 %) Spry nasal spray Commonly known as: ATROVENT  What changed:  when to take this reasons to take this  Administer 2 sprays into each nostril 2 (two) times a day as needed (allergies).   0         Medications To Continue      Sig Disp Refill Start End  Aerochamber Plus Flow-Vu Spcr Generic drug: inhalational spacing device  1 each by Other route.   0     albuterol  HFA 90 mcg/actuation inhaler Commonly known as: PROVENTIL  HFA;VENTOLIN  HFA;PROAIR  HFA  Inhale 2 puffs every 4 (four) hours as needed for shortness of breath or wheezing.   0     amoxicillin  500 mg tablet Commonly known as: AMOXIL   Take 4 tablets (2,000 mg total) by mouth once as needed (Take 1 hour prior to your scheduled dental cleaning or procedure).  4 tablet  2     Breztri  Aerosphere  160-9-4.8 mcg/actuation inhaler Generic drug: budesonide -glycopyr-formoterol   Inhale 2 puffs 2 (two) times a day.   0     chlorhexidine  0.12 % solution Commonly known as: PERIDEX   Use 15 mL in the mouth or throat 2 (two) times a day.   0     EPINEPHrine  0.3 mg/0.3 mL injection syringe Commonly known as: EPIPEN   Inject 1 Syringe into the thigh as needed for anaphylaxis.   0     famotidine  20 mg tablet Commonly known as: PEPCID   Take 20 mg by mouth 2 (two) times a day.   0     FLUoxetine 20 mg capsule Commonly known as: PROzac  Take 20 mg by mouth daily.   0     folic acid  1 mg tablet Commonly known as: FOLVITE   Take 1 mg by mouth daily.   0     gabapentin 300 mg capsule Commonly known as: NEURONTIN  Take 1 capsule (300 mg total) by mouth 2 (two) times a day.   0     melatonin 3 mg tablet  Take by mouth Every evening.   0     montelukast  10 mg tablet Commonly known as: SINGULAIR   Take 10 mg by mouth at bedtime.   0     pantoprazole  40 mg EC tablet Commonly known as: PROTONIX   Take 40 mg by mouth every morning before breakfast.   0     traZODone  50 mg tablet Commonly known as: DESYREL   Take 50 mg by mouth nightly.   0         Discharge Orders     Treatment Parameter     Comments: HOLD treatment and notify provider IF CrCl LESS THAN 50 mL/min CrCl on 06/26/24 = 42 ml/min. Renally and age adjusted dose of Melphalan utilized in this treatment  plan.  Fludarabine dose reduced to 80% dose due to renal function per Dr. Kayla. Electronically signed by: Daphne Cherylene Sayer, PharmD, CPP 07/10/2024 3:52 PM   Treatment Parameter     Comments: HOLD treatment and notify provider IF CrCl LESS THAN 50 mL/min CrCl on 06/26/24 = 42 ml/min. Renally and age adjusted dose of Melphalan utilized in this treatment plan.  Fludarabine dose reduced to 80% dose due to renal function per Dr. Kayla. Electronically signed by: Daphne Cherylene Sayer, PharmD, CPP 07/10/2024 3:52 PM    Treatment Parameter     Comments: HOLD treatment and notify provider IF CrCl LESS THAN 50 mL/min CrCl on 06/26/24 = 42 ml/min. Renally and age adjusted dose of Melphalan utilized in this treatment plan.  Fludarabine dose reduced to 80% dose due to renal function per Dr. Kayla. Electronically signed by: Daphne Cherylene Sayer, PharmD, CPP 07/10/2024 3:52 PM   Treatment Parameter     Comments: HOLD treatment and notify provider IF CrCl LESS THAN 50 mL/min CrCl on 06/26/24 = 42 ml/min. Renally and age adjusted dose of Melphalan utilized in this treatment plan.  Fludarabine dose reduced to 80% dose due to renal function per Dr. Kayla. Electronically signed by: Daphne Cherylene Sayer, PharmD, CPP 07/10/2024 3:52 PM   Treatment Parameter     Comments: HOLD treatment and notify provider IF CrCl LESS THAN 50 mL/min CrCl on 06/26/24 = 42 ml/min. Renally and age adjusted dose of Melphalan utilized in this treatment plan.  Fludarabine dose reduced to 80% dose due to renal function per Dr. Kayla. Electronically signed by: Daphne Cherylene Sayer, PharmD, CPP 07/10/2024 3:52 PM   Vannie     Details:    Height (in inches): 1.545 m (5' 0.83)   Weight (in lbs.): 64.1 kg (141 lb 6 oz)   Walker Options: Rolling   Length of need: Lifetime        Outpatient Follow-Up Future Appointments  Date Time Provider Department Center  08/16/2024 12:30 PM HEM ONC 03 CATH CARE 1 [facility] HONC C3 WFB Comp Can  08/16/2024  1:00 PM HEM ONC 03 BMT 2 [facility] HONC C3 WFB Comp Can  08/16/2024  2:00 PM [facility] INFUSION CCC POD E [facility] INF CCC WFB Comp Can  08/20/2024  8:30 AM HEM ONC 03 CATH CARE 2 [facility] HONC C3 WFB Comp Can  08/20/2024  9:00 AM HEM ONC 03 BMT 2 [facility] HONC C3 WFB Comp Can  08/20/2024  9:30 AM HEM ONC 03 BMT 1 [facility] HONC C3 WFB Comp Can  08/20/2024  9:30 AM HEM ONC 03 PROCEDURE ROOM [facility] HONC C3 WFB Comp Can  08/20/2024 10:20 AM [facility] INFUSION CCC POD Q [facility] INF CCC WFB Comp Can  08/23/2024  1:00 PM HEM ONC 03 CATH CARE 3 [facility] HONC C3 WFB Comp  Can  08/23/2024  1:30 PM HEM ONC 03 BMT 2 [facility] HONC C3 WFB Comp Can  08/23/2024  2:00 PM [facility] INFUSION CCC POD E [facility] INF CCC WFB Comp Can  08/27/2024  1:00 PM HEM ONC 03 CATH CARE 4 [facility] HONC C3 WFB Comp Can  08/27/2024  1:30 PM HEM ONC 03 BMT 2 [facility] HONC C3 WFB Comp Can  08/27/2024  2:00 PM [facility] INFUSION CCC POD A [facility] INF CCC WFB Comp Can  08/30/2024  8:00 AM HEM ONC 03 CATH CARE 3 [facility] HONC C3 WFB Comp Can  08/30/2024  8:30 AM HEM ONC 03 BMT 1 [facility] HONC C3 WFB Comp Can  08/30/2024  9:00  AM [facility] INFUSION CCC POD E [facility] INF CCC WFB Comp Can  09/03/2024  8:30 AM HEM ONC 03 CATH CARE 2 [facility] HONC C3 WFB Comp Can  09/03/2024  9:00 AM HEM ONC 03 BMT 1 [facility] HONC C3 WFB Comp Can  09/03/2024  9:40 AM [facility] INFUSION CCC POD E [facility] INF CCC WFB Comp Can  09/06/2024  9:00 AM HEM ONC 03 CATH CARE 2 [facility] HONC C3 WFB Comp Can  09/06/2024  9:30 AM HEM ONC 03 BMT 2 [facility] HONC C3 WFB Comp Can  09/06/2024 10:00 AM [facility] INFUSION CCC POD D [facility] INF CCC WFB Comp Can    [facility] Referral Information   No data to display

## 2024-08-14 NOTE — Nursing Note (Signed)
 Reviewed appointment information with the patient and son (via telephone). Left two paper copies of the appointment schedule on the patient's mail in her room.  Opportunity for questions and concerns was given with no questions or concerns voiced.    Josette  [phone]

## 2024-08-14 NOTE — Nursing Note (Signed)
 Spoke to the son (in person) to inquire if there were any questions or concerns about the discharge education. None were voiced.    Josette  [phone]

## 2024-08-14 NOTE — Progress Notes (Addendum)
 Case Management Update  Date: 08/14/2024   Time: 11:56 AM   Patient Type: Inpatient  MSW confirmed with April at [employer] [phone]) equipment that rolling walker will be delivered to patient's room today.  3:30PM UPDATE:  MSW confirmed delivery of rolling walker to patient's room  Case Management Coordination Status: Coordination In-Progress   Anticipated Discharge Location: Home with Durable Medical Equipment  If Plan A discharging location is not feasible: Potential Plan B: Home with Durable Medical Equipment  Durable Medical Equipment Coordination Status: Coordination complete.     Montie Bramble, MSW Social Worker [phone]

## 2024-08-15 ENCOUNTER — Telehealth: Payer: Self-pay

## 2024-08-15 NOTE — Patient Instructions (Signed)
 Visit Information  Thank you for taking time to visit with me today. Please don't hesitate to contact me if I can be of assistance to you    Call Your Provider Right Away  Fever is an emergency. Call immediately for:  Fever >= 100.17F (38C) Shortness of breath New cough, sore throat, or chills Vomiting or diarrhea > 2 times in 24 hours Bleeding or bruising Painful urination New rash, redness, or swelling   New skin rash Yellowing of eyes/skin Persistent nausea, vomiting, or diarrhea Dry eyes or mouth Joint stiffness    Patient verbalizes understanding of instructions and care plan provided today and agrees to view in MyChart. Active MyChart status and patient understanding of how to access instructions and care plan via MyChart confirmed with patient.     The patient has been provided with contact information for the care management team and has been advised to call with any health related questions or concerns.   Please call the care guide team at [phone] if you need to cancel or reschedule your appointment.   Please call the Suicide and Crisis Lifeline: 988 if you are experiencing a Mental Health or Behavioral Health Crisis or need someone to talk to.  Nazar Kuan J. Caellum Mancil RN, MSN [facility], [facility] RN Care Manager Direct Dial: [phone]  Fax: [phone] Website: [email or web address]

## 2024-08-15 NOTE — Transitions of Care (Post Inpatient/ED Visit) (Signed)
" ° °  08/15/2024  Name: [name] MRN: [id number] DOB: [birth year]  Today's TOC FU Call Status: Today's TOC FU Call Status:: Successful TOC FU Call Completed TOC FU Call Complete Date: 08/15/24  Patient's Name and Date of Birth confirmed. DOB, Name  Transition Care Management Follow-up Telephone Call Date of Discharge: 08/14/24 Discharge Facility: Other [facility]) Name of Other (Non[facility]) Discharge Facility: [facility] Type of Discharge: Inpatient Admission Primary Inpatient Discharge Diagnosis:: chronic myeloid leukemia How have you been since you were released from the hospital?: Better Any questions or concerns?: No  Items Reviewed: Did you receive and understand the discharge instructions provided?: Yes Medications obtained,verified, and reconciled?: No Medications Not Reviewed Reasons:: Other: (patient states son helps with medications) Any new allergies since your discharge?: No Dietary orders reviewed?: Yes Type of Diet Ordered:: Regular diet Do you have support at home?: Yes People in Home [RPT]: child(ren), adult Name of Support/Comfort Primary Source: William-son  Medications Reviewed Today: Medications Reviewed Today   Medications were not reviewed in this encounter     Home Care and Equipment/Supplies: Were Home Health Services Ordered?: NA Any new equipment or medical supplies ordered?: Yes Name of Medical supply agency?: At Hospital- patient not sure Were you able to get the equipment/medical supplies?: Yes Do you have any questions related to the use of the equipment/supplies?: No  Functional Questionnaire: Do you need assistance with bathing/showering or dressing?: No Do you need assistance with meal preparation?: No Do you need assistance with eating?: No Do you have difficulty maintaining continence: No Do you need assistance with getting out of bed/getting out of a chair/moving?: No Do you have difficulty managing or  taking your medications?: Yes (yes son helps with medication management)  Follow up appointments reviewed: PCP Follow-up appointment confirmed?: No (Patient to call for hospital follow up) MD Provider Line Number:[phone] Given: No Specialist Hospital Follow-up appointment confirmed?: Yes Date of Specialist follow-up appointment?: 08/16/24 Follow-Up Specialty Provider:: Oncology Do you need transportation to your follow-up appointment?: No Do you understand care options if your condition(s) worsen?: Yes-patient verbalized understanding  SDOH Interventions Today    Flowsheet Row Most Recent Value  SDOH Interventions   Food Insecurity Interventions Intervention Not Indicated  Housing Interventions Intervention Not Indicated  Transportation Interventions Intervention Not Indicated  Utilities Interventions Intervention Not Indicated   Discussed and offered 30 day TOC program.  Patient    declined.  The patient has been provided with contact information for the care management team and has been advised to call with any health -related questions or concerns.  The patient verbalized understanding with current plan of care.  The patient is directed to their insurance card regarding availability of benefits coverage.    "

## 2024-08-15 NOTE — Progress Notes (Signed)
 Case Management Discharge Note        CSN: [id number] DOB: Feb 11, 1951 Service: Bone Marrow Transplant Location: C710/A  Patient Class: Inpatient  DC Disposition: : Home or Self Care  Discharge DC Disposition: : Home or Self Care DME: [facility] [employer] Patient DME Agency: [facility]  Discharge Referrals Case closed, patient/family agree with disposition plan: Yes    Case Management Coordination Status: Coordination Complete   Montie Bramble, MSW Social Worker [phone]

## 2024-10-10 ENCOUNTER — Ambulatory Visit: Admitting: Allergy
